# Patient Record
Sex: Male | Born: 1950 | Race: White | Hispanic: No | Marital: Married | Smoking: Former smoker
Health system: Southern US, Community
[De-identification: ages and names within clinical notes are randomized; demographics above are authoritative.]

## PROBLEM LIST (undated history)

## (undated) DIAGNOSIS — E119 Type 2 diabetes mellitus without complications: Secondary | ICD-10-CM

## (undated) DIAGNOSIS — E1169 Type 2 diabetes mellitus with other specified complication: Secondary | ICD-10-CM

## (undated) DIAGNOSIS — R040 Epistaxis: Secondary | ICD-10-CM

## (undated) DIAGNOSIS — I10 Essential (primary) hypertension: Secondary | ICD-10-CM

## (undated) HISTORY — PX: HIP SURGERY: SHX245

---

## 2011-05-20 DIAGNOSIS — W208XXA Other cause of strike by thrown, projected or falling object, initial encounter: Secondary | ICD-10-CM

## 2011-05-20 HISTORY — DX: Other cause of strike by thrown, projected or falling object, initial encounter: W20.8XXA

## 2012-05-19 DIAGNOSIS — R040 Epistaxis: Secondary | ICD-10-CM

## 2012-05-19 HISTORY — DX: Epistaxis: R04.0

## 2013-05-14 ENCOUNTER — Emergency Department (HOSPITAL_COMMUNITY)
Admission: EM | Admit: 2013-05-14 | Discharge: 2013-05-14 | Disposition: A | Discharge status: Home / Self Care | Payer: PRIVATE HEALTH INSURANCE | Attending: Emergency Medicine | Admitting: Emergency Medicine

## 2013-05-14 ENCOUNTER — Encounter (HOSPITAL_COMMUNITY): Payer: Self-pay | Admitting: Emergency Medicine

## 2013-05-14 DIAGNOSIS — R110 Nausea: Secondary | ICD-10-CM | POA: Insufficient documentation

## 2013-05-14 DIAGNOSIS — E119 Type 2 diabetes mellitus without complications: Secondary | ICD-10-CM | POA: Insufficient documentation

## 2013-05-14 DIAGNOSIS — R040 Epistaxis: Secondary | ICD-10-CM

## 2013-05-14 DIAGNOSIS — I10 Essential (primary) hypertension: Secondary | ICD-10-CM | POA: Insufficient documentation

## 2013-05-14 DIAGNOSIS — Z79899 Other long term (current) drug therapy: Secondary | ICD-10-CM | POA: Insufficient documentation

## 2013-05-14 DIAGNOSIS — Z888 Allergy status to other drugs, medicaments and biological substances status: Secondary | ICD-10-CM | POA: Insufficient documentation

## 2013-05-14 HISTORY — DX: Type 2 diabetes mellitus without complications: E11.9

## 2013-05-14 HISTORY — DX: Epistaxis: R04.0

## 2013-05-14 HISTORY — DX: Essential (primary) hypertension: I10

## 2013-05-14 LAB — CBC WITH DIFFERENTIAL/PLATELET
Basophils Relative: 0 % (ref 0–1)
Eosinophils Absolute: 0.3 10*3/uL (ref 0.0–0.7)
Eosinophils Relative: 3 % (ref 0–5)
Hemoglobin: 13.3 g/dL (ref 13.0–17.0)
Lymphocytes Relative: 16 % (ref 12–46)
Lymphs Abs: 1.6 10*3/uL (ref 0.7–4.0)
MCH: 30.6 pg (ref 26.0–34.0)
MCHC: 34.3 g/dL (ref 30.0–36.0)
MCV: 89.4 fL (ref 78.0–100.0)
Monocytes Relative: 7 % (ref 3–12)
Neutrophils Relative %: 75 % (ref 43–77)
RBC: 4.34 MIL/uL (ref 4.22–5.81)
RDW: 14.1 % (ref 11.5–15.5)

## 2013-05-14 LAB — BASIC METABOLIC PANEL
BUN: 25 mg/dL — ABNORMAL HIGH (ref 6–23)
CO2: 24 mEq/L (ref 19–32)
Calcium: 9 mg/dL (ref 8.4–10.5)
Creatinine, Ser: 1.1 mg/dL (ref 0.50–1.35)
GFR calc Af Amer: 80 mL/min — ABNORMAL LOW (ref >90)
GFR calc non Af Amer: 69 mL/min — ABNORMAL LOW (ref >90)
Potassium: 4.3 mEq/L (ref 3.5–5.1)

## 2013-05-14 LAB — PROTIME-INR: INR: 0.99 (ref 0.00–1.49)

## 2013-05-14 MED ORDER — OXYMETAZOLINE HCL 0.05 % NA SOLN
2.0000 | Freq: Once | NASAL | Status: AC
  Administered 2013-05-14: 2 via NASAL
  Filled 2013-05-14: qty 15

## 2013-05-14 NOTE — ED Notes (Signed)
ENT cart at the bedside.

## 2013-05-14 NOTE — ED Notes (Signed)
Saa, phlebotomy at the bedside.

## 2013-05-14 NOTE — ED Provider Notes (Signed)
CSN: [id number]     Arrival date & time 05/14/13  0631 History   First MD Initiated Contact with Patient 05/14/13 [phone]     Chief Complaint  Patient presents with  . Epistaxis   (Consider location/radiation/quality/duration/timing/severity/associated sxs/prior Treatment) HPI Comments: Gary [name] is a 62 year-old male with a past medical history of reoccurring epistaxis s/p multiple procedures, presenting the Emergency Department with a chief complaint of epistaxis since 0615 today.  Denies history of trauma.Last episode was Sunday morning, followed up with ENT and underwent a cauterization procedure. He denies antiplatelet therapy or anticoagulation therapy. Denies history of bleeding disorders.  He reports coughing up a large blood clot while in the ED. ENT: Dr. Adriana Reams   The history is provided by the patient and a relative. No language interpreter was used.    Past Medical History  Diagnosis Date  . Hypertension   . Diabetes mellitus without complication   . Epistaxis    Past Surgical History  Procedure Laterality Date  . Hip surgery     No family history on file. History  Substance Use Topics  . Smoking status: Never Smoker   . Smokeless tobacco: Not on file  . Alcohol Use: No    Review of Systems  Gastrointestinal: Positive for nausea. Negative for vomiting and abdominal pain.    Allergies  Novocain  Home Medications   Current Outpatient Rx  Name  Route  Sig  Dispense  Refill  . acetaminophen (TYLENOL) 325 MG tablet   Oral   Take 650 mg by mouth every 6 (six) hours as needed.         Marland Kitchen amLODipine (NORVASC) 5 MG tablet   Oral   Take 5 mg by mouth every evening.         Marland Kitchen atorvastatin (LIPITOR) 40 MG tablet   Oral   Take 40 mg by mouth every evening.         Marland Kitchen buPROPion (WELLBUTRIN XL) 150 MG 24 hr tablet   Oral   Take 150 mg by mouth 2 (two) times daily.         . Calcium Carbonate-Vitamin D (CALCIUM + D PO)   Oral   Take 1 tablet by  mouth 2 (two) times daily.         . enalapril-hydrochlorothiazide (VASERETIC) 10-25 MG per tablet   Oral   Take 1 tablet by mouth every morning.         Marland Kitchen glipiZIDE (GLUCOTROL XL) 10 MG 24 hr tablet   Oral   Take 10 mg by mouth every morning.         . Multiple Vitamin (MULTIVITAMIN WITH MINERALS) TABS tablet   Oral   Take 1 tablet by mouth daily.         . pioglitazone-metformin (ACTOPLUS MET) 15-850 MG per tablet   Oral   Take 1-2 tablets by mouth 2 (two) times daily with a meal. Take two tablets in the morning, and one in the evening.          BP 138/73  Pulse 95  Temp(Src) 98.9 F (37.2 C) (Oral)  Resp 14  Ht 5' 7.5" (1.715 m)  Wt 232 lb (105.235 kg)  BMI 35.78 kg/m2  SpO2 93% Physical Exam  Nursing note and vitals reviewed. Constitutional: He appears well-developed and well-nourished.  HENT:  Head: Normocephalic and atraumatic.  Nose: Epistaxis is observed.  Blood observed in bilateral nares. No active bleeding.  No identifiable cauterizeable lesion.  Neck:  Neck supple.  Cardiovascular: Normal rate and regular rhythm.   No murmur heard. Pulmonary/Chest: Effort normal. No respiratory distress.  Abdominal: Soft. There is no tenderness.  Skin: Skin is warm and dry. No rash noted.  Psychiatric: He has a normal mood and affect.    ED Course  Procedures (including critical care time) Labs Review Labs Reviewed  CBC WITH DIFFERENTIAL - Abnormal; Notable for the following:    HCT 38.8 (*)    All other components within normal limits  BASIC METABOLIC PANEL - Abnormal; Notable for the following:    Sodium 134 (*)    Glucose, Bld 113 (*)    BUN 25 (*)    GFR calc non Af Amer 69 (*)    GFR calc Af Amer 80 (*)    All other components within normal limits  PROTIME-INR   Imaging Review No results found.  EKG Interpretation   None       MDM   1. Epistaxis    Pt with epistaxis, multiple procedures in the past.  Bleeding subsided in ED. Hbg: 13.3.   Afrin ordered. Re-eval: resolution without another episode. Discussed patient history, condition, and labs with Dr. Canary Brim who agrees the patient can be evaluated as an out-pt. Discussed lab results, imaging results, and treatment plan with the patient. Return precautions given. Reports understanding and no other concerns at this time.  Patient is stable for discharge at this time. Meds given in ED:  Medications  oxymetazoline (AFRIN) 0.05 % nasal spray 2 spray (2 sprays Each Nare Given 05/14/13 0954)    Discharge Medication List as of 05/14/2013 10:54 AM           Ander Purpura Burnetta Sabin, PA-C 05/15/13 2113

## 2013-05-14 NOTE — ED Notes (Signed)
Pt's nose continuing to bleed. Does not feel the blood running down the throat at this time.

## 2013-05-14 NOTE — ED Notes (Signed)
Pt. reports intermittent epistaxis at left nare since Sunday morning , denies injury .

## 2013-05-17 NOTE — ED Provider Notes (Signed)
Medical screening examination/treatment/procedure(s) were performed by non-physician practitioner and as supervising physician I was immediately available for consultation/collaboration.  EKG Interpretation   None        Threasa Beards, MD 05/17/13 [phone]

## 2013-11-08 DIAGNOSIS — E119 Type 2 diabetes mellitus without complications: Secondary | ICD-10-CM | POA: Diagnosis not present

## 2013-11-08 DIAGNOSIS — I10 Essential (primary) hypertension: Secondary | ICD-10-CM | POA: Diagnosis not present

## 2013-11-08 DIAGNOSIS — M1990 Unspecified osteoarthritis, unspecified site: Secondary | ICD-10-CM | POA: Diagnosis not present

## 2013-11-08 DIAGNOSIS — E7800 Pure hypercholesterolemia, unspecified: Secondary | ICD-10-CM | POA: Diagnosis not present

## 2013-11-08 DIAGNOSIS — N189 Chronic kidney disease, unspecified: Secondary | ICD-10-CM | POA: Diagnosis not present

## 2013-11-09 DIAGNOSIS — E119 Type 2 diabetes mellitus without complications: Secondary | ICD-10-CM | POA: Diagnosis not present

## 2013-11-16 DIAGNOSIS — F411 Generalized anxiety disorder: Secondary | ICD-10-CM | POA: Diagnosis not present

## 2013-11-16 DIAGNOSIS — M1990 Unspecified osteoarthritis, unspecified site: Secondary | ICD-10-CM | POA: Diagnosis not present

## 2013-11-16 DIAGNOSIS — E119 Type 2 diabetes mellitus without complications: Secondary | ICD-10-CM | POA: Diagnosis not present

## 2013-11-16 DIAGNOSIS — I10 Essential (primary) hypertension: Secondary | ICD-10-CM | POA: Diagnosis not present

## 2013-11-16 DIAGNOSIS — N189 Chronic kidney disease, unspecified: Secondary | ICD-10-CM | POA: Diagnosis not present

## 2013-11-16 DIAGNOSIS — E7800 Pure hypercholesterolemia, unspecified: Secondary | ICD-10-CM | POA: Diagnosis not present

## 2013-11-16 DIAGNOSIS — F321 Major depressive disorder, single episode, moderate: Secondary | ICD-10-CM | POA: Diagnosis not present

## 2014-02-28 DIAGNOSIS — S3210XD Unspecified fracture of sacrum, subsequent encounter for fracture with routine healing: Secondary | ICD-10-CM | POA: Diagnosis not present

## 2014-02-28 DIAGNOSIS — M160 Bilateral primary osteoarthritis of hip: Secondary | ICD-10-CM | POA: Diagnosis not present

## 2014-02-28 DIAGNOSIS — M47816 Spondylosis without myelopathy or radiculopathy, lumbar region: Secondary | ICD-10-CM | POA: Diagnosis not present

## 2014-02-28 DIAGNOSIS — M439 Deforming dorsopathy, unspecified: Secondary | ICD-10-CM | POA: Diagnosis not present

## 2014-02-28 DIAGNOSIS — Z09 Encounter for follow-up examination after completed treatment for conditions other than malignant neoplasm: Secondary | ICD-10-CM | POA: Diagnosis not present

## 2014-04-17 DIAGNOSIS — Z23 Encounter for immunization: Secondary | ICD-10-CM | POA: Diagnosis not present

## 2014-05-16 DIAGNOSIS — N189 Chronic kidney disease, unspecified: Secondary | ICD-10-CM | POA: Diagnosis not present

## 2014-05-16 DIAGNOSIS — E119 Type 2 diabetes mellitus without complications: Secondary | ICD-10-CM | POA: Diagnosis not present

## 2014-05-16 DIAGNOSIS — M1990 Unspecified osteoarthritis, unspecified site: Secondary | ICD-10-CM | POA: Diagnosis not present

## 2014-05-16 DIAGNOSIS — E780 Pure hypercholesterolemia: Secondary | ICD-10-CM | POA: Diagnosis not present

## 2014-05-16 DIAGNOSIS — I10 Essential (primary) hypertension: Secondary | ICD-10-CM | POA: Diagnosis not present

## 2014-05-23 DIAGNOSIS — F329 Major depressive disorder, single episode, unspecified: Secondary | ICD-10-CM | POA: Diagnosis not present

## 2014-05-23 DIAGNOSIS — F419 Anxiety disorder, unspecified: Secondary | ICD-10-CM | POA: Diagnosis not present

## 2014-05-23 DIAGNOSIS — E785 Hyperlipidemia, unspecified: Secondary | ICD-10-CM | POA: Diagnosis not present

## 2014-05-23 DIAGNOSIS — E119 Type 2 diabetes mellitus without complications: Secondary | ICD-10-CM | POA: Diagnosis not present

## 2014-05-23 DIAGNOSIS — Z1389 Encounter for screening for other disorder: Secondary | ICD-10-CM | POA: Diagnosis not present

## 2014-05-23 DIAGNOSIS — I10 Essential (primary) hypertension: Secondary | ICD-10-CM | POA: Diagnosis not present

## 2014-06-11 DIAGNOSIS — Z87891 Personal history of nicotine dependence: Secondary | ICD-10-CM | POA: Diagnosis not present

## 2014-06-11 DIAGNOSIS — R040 Epistaxis: Secondary | ICD-10-CM | POA: Diagnosis not present

## 2014-06-11 DIAGNOSIS — E119 Type 2 diabetes mellitus without complications: Secondary | ICD-10-CM | POA: Diagnosis not present

## 2014-06-11 DIAGNOSIS — Z79899 Other long term (current) drug therapy: Secondary | ICD-10-CM | POA: Diagnosis not present

## 2014-06-11 DIAGNOSIS — I10 Essential (primary) hypertension: Secondary | ICD-10-CM | POA: Diagnosis not present

## 2014-06-14 DIAGNOSIS — G4733 Obstructive sleep apnea (adult) (pediatric): Secondary | ICD-10-CM | POA: Diagnosis not present

## 2014-06-14 DIAGNOSIS — R040 Epistaxis: Secondary | ICD-10-CM | POA: Diagnosis not present

## 2014-06-28 DIAGNOSIS — Z6837 Body mass index (BMI) 37.0-37.9, adult: Secondary | ICD-10-CM | POA: Diagnosis not present

## 2014-06-28 DIAGNOSIS — G4733 Obstructive sleep apnea (adult) (pediatric): Secondary | ICD-10-CM | POA: Diagnosis not present

## 2014-06-28 DIAGNOSIS — G4761 Periodic limb movement disorder: Secondary | ICD-10-CM | POA: Diagnosis not present

## 2014-07-03 DIAGNOSIS — G4733 Obstructive sleep apnea (adult) (pediatric): Secondary | ICD-10-CM | POA: Diagnosis not present

## 2014-07-26 DIAGNOSIS — G4733 Obstructive sleep apnea (adult) (pediatric): Secondary | ICD-10-CM | POA: Diagnosis not present

## 2014-11-21 DIAGNOSIS — I10 Essential (primary) hypertension: Secondary | ICD-10-CM | POA: Diagnosis not present

## 2014-11-21 DIAGNOSIS — E780 Pure hypercholesterolemia: Secondary | ICD-10-CM | POA: Diagnosis not present

## 2014-11-21 DIAGNOSIS — N189 Chronic kidney disease, unspecified: Secondary | ICD-10-CM | POA: Diagnosis not present

## 2014-11-21 DIAGNOSIS — E119 Type 2 diabetes mellitus without complications: Secondary | ICD-10-CM | POA: Diagnosis not present

## 2014-11-21 DIAGNOSIS — E785 Hyperlipidemia, unspecified: Secondary | ICD-10-CM | POA: Diagnosis not present

## 2014-11-29 DIAGNOSIS — F419 Anxiety disorder, unspecified: Secondary | ICD-10-CM | POA: Diagnosis not present

## 2014-11-29 DIAGNOSIS — E785 Hyperlipidemia, unspecified: Secondary | ICD-10-CM | POA: Diagnosis not present

## 2014-11-29 DIAGNOSIS — F329 Major depressive disorder, single episode, unspecified: Secondary | ICD-10-CM | POA: Diagnosis not present

## 2014-11-29 DIAGNOSIS — E119 Type 2 diabetes mellitus without complications: Secondary | ICD-10-CM | POA: Diagnosis not present

## 2014-11-29 DIAGNOSIS — I10 Essential (primary) hypertension: Secondary | ICD-10-CM | POA: Diagnosis not present

## 2015-02-06 DIAGNOSIS — Z23 Encounter for immunization: Secondary | ICD-10-CM | POA: Diagnosis not present

## 2015-06-04 DIAGNOSIS — N189 Chronic kidney disease, unspecified: Secondary | ICD-10-CM | POA: Diagnosis not present

## 2015-06-04 DIAGNOSIS — E119 Type 2 diabetes mellitus without complications: Secondary | ICD-10-CM | POA: Diagnosis not present

## 2015-06-04 DIAGNOSIS — E7800 Pure hypercholesterolemia, unspecified: Secondary | ICD-10-CM | POA: Diagnosis not present

## 2015-06-04 DIAGNOSIS — I10 Essential (primary) hypertension: Secondary | ICD-10-CM | POA: Diagnosis not present

## 2015-06-12 DIAGNOSIS — E119 Type 2 diabetes mellitus without complications: Secondary | ICD-10-CM | POA: Diagnosis not present

## 2015-06-12 DIAGNOSIS — F419 Anxiety disorder, unspecified: Secondary | ICD-10-CM | POA: Diagnosis not present

## 2015-06-12 DIAGNOSIS — F329 Major depressive disorder, single episode, unspecified: Secondary | ICD-10-CM | POA: Diagnosis not present

## 2015-06-12 DIAGNOSIS — E785 Hyperlipidemia, unspecified: Secondary | ICD-10-CM | POA: Diagnosis not present

## 2015-06-12 DIAGNOSIS — I10 Essential (primary) hypertension: Secondary | ICD-10-CM | POA: Diagnosis not present

## 2015-12-10 DIAGNOSIS — E119 Type 2 diabetes mellitus without complications: Secondary | ICD-10-CM | POA: Diagnosis not present

## 2015-12-10 DIAGNOSIS — Z13220 Encounter for screening for lipoid disorders: Secondary | ICD-10-CM | POA: Diagnosis not present

## 2015-12-10 DIAGNOSIS — E785 Hyperlipidemia, unspecified: Secondary | ICD-10-CM | POA: Diagnosis not present

## 2015-12-10 DIAGNOSIS — N189 Chronic kidney disease, unspecified: Secondary | ICD-10-CM | POA: Diagnosis not present

## 2015-12-10 DIAGNOSIS — F329 Major depressive disorder, single episode, unspecified: Secondary | ICD-10-CM | POA: Diagnosis not present

## 2015-12-10 DIAGNOSIS — I10 Essential (primary) hypertension: Secondary | ICD-10-CM | POA: Diagnosis not present

## 2015-12-10 DIAGNOSIS — E7800 Pure hypercholesterolemia, unspecified: Secondary | ICD-10-CM | POA: Diagnosis not present

## 2015-12-14 DIAGNOSIS — F419 Anxiety disorder, unspecified: Secondary | ICD-10-CM | POA: Diagnosis not present

## 2015-12-14 DIAGNOSIS — E785 Hyperlipidemia, unspecified: Secondary | ICD-10-CM | POA: Diagnosis not present

## 2015-12-14 DIAGNOSIS — I10 Essential (primary) hypertension: Secondary | ICD-10-CM | POA: Diagnosis not present

## 2015-12-14 DIAGNOSIS — Z23 Encounter for immunization: Secondary | ICD-10-CM | POA: Diagnosis not present

## 2015-12-14 DIAGNOSIS — Z1212 Encounter for screening for malignant neoplasm of rectum: Secondary | ICD-10-CM | POA: Diagnosis not present

## 2015-12-14 DIAGNOSIS — E119 Type 2 diabetes mellitus without complications: Secondary | ICD-10-CM | POA: Diagnosis not present

## 2015-12-14 DIAGNOSIS — F329 Major depressive disorder, single episode, unspecified: Secondary | ICD-10-CM | POA: Diagnosis not present

## 2015-12-14 DIAGNOSIS — Z6835 Body mass index (BMI) 35.0-35.9, adult: Secondary | ICD-10-CM | POA: Diagnosis not present

## 2016-02-11 DIAGNOSIS — Z23 Encounter for immunization: Secondary | ICD-10-CM | POA: Diagnosis not present

## 2016-06-13 DIAGNOSIS — I10 Essential (primary) hypertension: Secondary | ICD-10-CM | POA: Diagnosis not present

## 2016-06-13 DIAGNOSIS — E119 Type 2 diabetes mellitus without complications: Secondary | ICD-10-CM | POA: Diagnosis not present

## 2016-06-13 DIAGNOSIS — E785 Hyperlipidemia, unspecified: Secondary | ICD-10-CM | POA: Diagnosis not present

## 2016-06-18 DIAGNOSIS — F329 Major depressive disorder, single episode, unspecified: Secondary | ICD-10-CM | POA: Diagnosis not present

## 2016-06-18 DIAGNOSIS — E119 Type 2 diabetes mellitus without complications: Secondary | ICD-10-CM | POA: Diagnosis not present

## 2016-06-18 DIAGNOSIS — F419 Anxiety disorder, unspecified: Secondary | ICD-10-CM | POA: Diagnosis not present

## 2016-06-18 DIAGNOSIS — I10 Essential (primary) hypertension: Secondary | ICD-10-CM | POA: Diagnosis not present

## 2016-06-18 DIAGNOSIS — Z1212 Encounter for screening for malignant neoplasm of rectum: Secondary | ICD-10-CM | POA: Diagnosis not present

## 2016-06-18 DIAGNOSIS — E785 Hyperlipidemia, unspecified: Secondary | ICD-10-CM | POA: Diagnosis not present

## 2016-06-18 DIAGNOSIS — Z6836 Body mass index (BMI) 36.0-36.9, adult: Secondary | ICD-10-CM | POA: Diagnosis not present

## 2016-12-08 ENCOUNTER — Other Ambulatory Visit: Payer: Self-pay

## 2016-12-12 DIAGNOSIS — M1990 Unspecified osteoarthritis, unspecified site: Secondary | ICD-10-CM | POA: Diagnosis not present

## 2016-12-12 DIAGNOSIS — E119 Type 2 diabetes mellitus without complications: Secondary | ICD-10-CM | POA: Diagnosis not present

## 2016-12-12 DIAGNOSIS — E785 Hyperlipidemia, unspecified: Secondary | ICD-10-CM | POA: Diagnosis not present

## 2016-12-12 DIAGNOSIS — F419 Anxiety disorder, unspecified: Secondary | ICD-10-CM | POA: Diagnosis not present

## 2016-12-12 DIAGNOSIS — F329 Major depressive disorder, single episode, unspecified: Secondary | ICD-10-CM | POA: Diagnosis not present

## 2016-12-12 DIAGNOSIS — E7800 Pure hypercholesterolemia, unspecified: Secondary | ICD-10-CM | POA: Diagnosis not present

## 2016-12-12 DIAGNOSIS — I10 Essential (primary) hypertension: Secondary | ICD-10-CM | POA: Diagnosis not present

## 2016-12-12 DIAGNOSIS — N189 Chronic kidney disease, unspecified: Secondary | ICD-10-CM | POA: Diagnosis not present

## 2016-12-23 DIAGNOSIS — F329 Major depressive disorder, single episode, unspecified: Secondary | ICD-10-CM | POA: Diagnosis not present

## 2016-12-23 DIAGNOSIS — Z6835 Body mass index (BMI) 35.0-35.9, adult: Secondary | ICD-10-CM | POA: Diagnosis not present

## 2016-12-23 DIAGNOSIS — I10 Essential (primary) hypertension: Secondary | ICD-10-CM | POA: Diagnosis not present

## 2016-12-23 DIAGNOSIS — E785 Hyperlipidemia, unspecified: Secondary | ICD-10-CM | POA: Diagnosis not present

## 2016-12-23 DIAGNOSIS — Z0000 Encounter for general adult medical examination without abnormal findings: Secondary | ICD-10-CM | POA: Diagnosis not present

## 2016-12-23 DIAGNOSIS — F419 Anxiety disorder, unspecified: Secondary | ICD-10-CM | POA: Diagnosis not present

## 2016-12-23 DIAGNOSIS — E119 Type 2 diabetes mellitus without complications: Secondary | ICD-10-CM | POA: Diagnosis not present

## 2016-12-23 DIAGNOSIS — D692 Other nonthrombocytopenic purpura: Secondary | ICD-10-CM | POA: Diagnosis not present

## 2017-02-04 DIAGNOSIS — Z23 Encounter for immunization: Secondary | ICD-10-CM | POA: Diagnosis not present

## 2017-06-23 DIAGNOSIS — E7800 Pure hypercholesterolemia, unspecified: Secondary | ICD-10-CM | POA: Diagnosis not present

## 2017-06-23 DIAGNOSIS — E785 Hyperlipidemia, unspecified: Secondary | ICD-10-CM | POA: Diagnosis not present

## 2017-06-23 DIAGNOSIS — F419 Anxiety disorder, unspecified: Secondary | ICD-10-CM | POA: Diagnosis not present

## 2017-06-23 DIAGNOSIS — D692 Other nonthrombocytopenic purpura: Secondary | ICD-10-CM | POA: Diagnosis not present

## 2017-06-23 DIAGNOSIS — N189 Chronic kidney disease, unspecified: Secondary | ICD-10-CM | POA: Diagnosis not present

## 2017-06-23 DIAGNOSIS — E119 Type 2 diabetes mellitus without complications: Secondary | ICD-10-CM | POA: Diagnosis not present

## 2017-06-23 DIAGNOSIS — M1990 Unspecified osteoarthritis, unspecified site: Secondary | ICD-10-CM | POA: Diagnosis not present

## 2017-06-23 DIAGNOSIS — I10 Essential (primary) hypertension: Secondary | ICD-10-CM | POA: Diagnosis not present

## 2017-06-26 DIAGNOSIS — F329 Major depressive disorder, single episode, unspecified: Secondary | ICD-10-CM | POA: Diagnosis not present

## 2017-06-26 DIAGNOSIS — I10 Essential (primary) hypertension: Secondary | ICD-10-CM | POA: Diagnosis not present

## 2017-06-26 DIAGNOSIS — E785 Hyperlipidemia, unspecified: Secondary | ICD-10-CM | POA: Diagnosis not present

## 2017-06-26 DIAGNOSIS — D692 Other nonthrombocytopenic purpura: Secondary | ICD-10-CM | POA: Diagnosis not present

## 2017-06-26 DIAGNOSIS — Z6836 Body mass index (BMI) 36.0-36.9, adult: Secondary | ICD-10-CM | POA: Diagnosis not present

## 2017-06-26 DIAGNOSIS — E119 Type 2 diabetes mellitus without complications: Secondary | ICD-10-CM | POA: Diagnosis not present

## 2017-06-26 DIAGNOSIS — Z1212 Encounter for screening for malignant neoplasm of rectum: Secondary | ICD-10-CM | POA: Diagnosis not present

## 2017-06-26 DIAGNOSIS — F419 Anxiety disorder, unspecified: Secondary | ICD-10-CM | POA: Diagnosis not present

## 2017-12-29 DIAGNOSIS — N189 Chronic kidney disease, unspecified: Secondary | ICD-10-CM | POA: Diagnosis not present

## 2017-12-29 DIAGNOSIS — I10 Essential (primary) hypertension: Secondary | ICD-10-CM | POA: Diagnosis not present

## 2017-12-29 DIAGNOSIS — E7800 Pure hypercholesterolemia, unspecified: Secondary | ICD-10-CM | POA: Diagnosis not present

## 2017-12-29 DIAGNOSIS — E119 Type 2 diabetes mellitus without complications: Secondary | ICD-10-CM | POA: Diagnosis not present

## 2017-12-29 DIAGNOSIS — E785 Hyperlipidemia, unspecified: Secondary | ICD-10-CM | POA: Diagnosis not present

## 2017-12-29 DIAGNOSIS — F419 Anxiety disorder, unspecified: Secondary | ICD-10-CM | POA: Diagnosis not present

## 2018-01-01 DIAGNOSIS — Z0000 Encounter for general adult medical examination without abnormal findings: Secondary | ICD-10-CM | POA: Diagnosis not present

## 2018-01-01 DIAGNOSIS — D692 Other nonthrombocytopenic purpura: Secondary | ICD-10-CM | POA: Diagnosis not present

## 2018-01-01 DIAGNOSIS — Z6835 Body mass index (BMI) 35.0-35.9, adult: Secondary | ICD-10-CM | POA: Diagnosis not present

## 2018-01-01 DIAGNOSIS — E119 Type 2 diabetes mellitus without complications: Secondary | ICD-10-CM | POA: Diagnosis not present

## 2018-01-01 DIAGNOSIS — F329 Major depressive disorder, single episode, unspecified: Secondary | ICD-10-CM | POA: Diagnosis not present

## 2018-01-01 DIAGNOSIS — I10 Essential (primary) hypertension: Secondary | ICD-10-CM | POA: Diagnosis not present

## 2018-01-01 DIAGNOSIS — E785 Hyperlipidemia, unspecified: Secondary | ICD-10-CM | POA: Diagnosis not present

## 2018-01-01 DIAGNOSIS — F419 Anxiety disorder, unspecified: Secondary | ICD-10-CM | POA: Diagnosis not present

## 2018-01-28 DIAGNOSIS — Z23 Encounter for immunization: Secondary | ICD-10-CM | POA: Diagnosis not present

## 2018-07-05 DIAGNOSIS — K210 Gastro-esophageal reflux disease with esophagitis: Secondary | ICD-10-CM | POA: Diagnosis not present

## 2018-07-05 DIAGNOSIS — F419 Anxiety disorder, unspecified: Secondary | ICD-10-CM | POA: Diagnosis not present

## 2018-07-05 DIAGNOSIS — E039 Hypothyroidism, unspecified: Secondary | ICD-10-CM | POA: Diagnosis not present

## 2018-07-05 DIAGNOSIS — E1165 Type 2 diabetes mellitus with hyperglycemia: Secondary | ICD-10-CM | POA: Diagnosis not present

## 2018-07-05 DIAGNOSIS — E785 Hyperlipidemia, unspecified: Secondary | ICD-10-CM | POA: Diagnosis not present

## 2018-07-05 DIAGNOSIS — I10 Essential (primary) hypertension: Secondary | ICD-10-CM | POA: Diagnosis not present

## 2018-07-06 DIAGNOSIS — D692 Other nonthrombocytopenic purpura: Secondary | ICD-10-CM | POA: Diagnosis not present

## 2018-07-06 DIAGNOSIS — E785 Hyperlipidemia, unspecified: Secondary | ICD-10-CM | POA: Diagnosis not present

## 2018-07-06 DIAGNOSIS — I10 Essential (primary) hypertension: Secondary | ICD-10-CM | POA: Diagnosis not present

## 2018-07-06 DIAGNOSIS — E119 Type 2 diabetes mellitus without complications: Secondary | ICD-10-CM | POA: Diagnosis not present

## 2018-07-06 DIAGNOSIS — Z6836 Body mass index (BMI) 36.0-36.9, adult: Secondary | ICD-10-CM | POA: Diagnosis not present

## 2018-07-06 DIAGNOSIS — F329 Major depressive disorder, single episode, unspecified: Secondary | ICD-10-CM | POA: Diagnosis not present

## 2018-07-06 DIAGNOSIS — F419 Anxiety disorder, unspecified: Secondary | ICD-10-CM | POA: Diagnosis not present

## 2018-12-17 DIAGNOSIS — E1165 Type 2 diabetes mellitus with hyperglycemia: Secondary | ICD-10-CM | POA: Diagnosis not present

## 2018-12-17 DIAGNOSIS — I10 Essential (primary) hypertension: Secondary | ICD-10-CM | POA: Diagnosis not present

## 2018-12-17 DIAGNOSIS — N189 Chronic kidney disease, unspecified: Secondary | ICD-10-CM | POA: Diagnosis not present

## 2018-12-17 DIAGNOSIS — E039 Hypothyroidism, unspecified: Secondary | ICD-10-CM | POA: Diagnosis not present

## 2018-12-17 DIAGNOSIS — E785 Hyperlipidemia, unspecified: Secondary | ICD-10-CM | POA: Diagnosis not present

## 2018-12-17 DIAGNOSIS — F419 Anxiety disorder, unspecified: Secondary | ICD-10-CM | POA: Diagnosis not present

## 2018-12-17 DIAGNOSIS — K210 Gastro-esophageal reflux disease with esophagitis: Secondary | ICD-10-CM | POA: Diagnosis not present

## 2018-12-21 DIAGNOSIS — Z0000 Encounter for general adult medical examination without abnormal findings: Secondary | ICD-10-CM | POA: Diagnosis not present

## 2018-12-21 DIAGNOSIS — F419 Anxiety disorder, unspecified: Secondary | ICD-10-CM | POA: Diagnosis not present

## 2018-12-21 DIAGNOSIS — Z6835 Body mass index (BMI) 35.0-35.9, adult: Secondary | ICD-10-CM | POA: Diagnosis not present

## 2018-12-21 DIAGNOSIS — E785 Hyperlipidemia, unspecified: Secondary | ICD-10-CM | POA: Diagnosis not present

## 2018-12-21 DIAGNOSIS — E119 Type 2 diabetes mellitus without complications: Secondary | ICD-10-CM | POA: Diagnosis not present

## 2018-12-21 DIAGNOSIS — F329 Major depressive disorder, single episode, unspecified: Secondary | ICD-10-CM | POA: Diagnosis not present

## 2018-12-21 DIAGNOSIS — D692 Other nonthrombocytopenic purpura: Secondary | ICD-10-CM | POA: Diagnosis not present

## 2018-12-21 DIAGNOSIS — I10 Essential (primary) hypertension: Secondary | ICD-10-CM | POA: Diagnosis not present

## 2019-03-18 DIAGNOSIS — I10 Essential (primary) hypertension: Secondary | ICD-10-CM | POA: Diagnosis not present

## 2019-03-18 DIAGNOSIS — E782 Mixed hyperlipidemia: Secondary | ICD-10-CM | POA: Diagnosis not present

## 2019-03-21 DIAGNOSIS — Z23 Encounter for immunization: Secondary | ICD-10-CM | POA: Diagnosis not present

## 2019-04-18 DIAGNOSIS — E785 Hyperlipidemia, unspecified: Secondary | ICD-10-CM | POA: Diagnosis not present

## 2019-04-18 DIAGNOSIS — I10 Essential (primary) hypertension: Secondary | ICD-10-CM | POA: Diagnosis not present

## 2019-06-17 ENCOUNTER — Ambulatory Visit: Payer: PRIVATE HEALTH INSURANCE

## 2019-06-17 DIAGNOSIS — K2100 Gastro-esophageal reflux disease with esophagitis, without bleeding: Secondary | ICD-10-CM | POA: Diagnosis not present

## 2019-06-17 DIAGNOSIS — N189 Chronic kidney disease, unspecified: Secondary | ICD-10-CM | POA: Diagnosis not present

## 2019-06-17 DIAGNOSIS — E785 Hyperlipidemia, unspecified: Secondary | ICD-10-CM | POA: Diagnosis not present

## 2019-06-17 DIAGNOSIS — E039 Hypothyroidism, unspecified: Secondary | ICD-10-CM | POA: Diagnosis not present

## 2019-06-17 DIAGNOSIS — I10 Essential (primary) hypertension: Secondary | ICD-10-CM | POA: Diagnosis not present

## 2019-06-17 DIAGNOSIS — E1165 Type 2 diabetes mellitus with hyperglycemia: Secondary | ICD-10-CM | POA: Diagnosis not present

## 2019-06-17 DIAGNOSIS — M1990 Unspecified osteoarthritis, unspecified site: Secondary | ICD-10-CM | POA: Diagnosis not present

## 2019-06-17 DIAGNOSIS — E7800 Pure hypercholesterolemia, unspecified: Secondary | ICD-10-CM | POA: Diagnosis not present

## 2019-06-21 DIAGNOSIS — E785 Hyperlipidemia, unspecified: Secondary | ICD-10-CM | POA: Diagnosis not present

## 2019-06-21 DIAGNOSIS — F419 Anxiety disorder, unspecified: Secondary | ICD-10-CM | POA: Diagnosis not present

## 2019-06-21 DIAGNOSIS — E119 Type 2 diabetes mellitus without complications: Secondary | ICD-10-CM | POA: Diagnosis not present

## 2019-06-21 DIAGNOSIS — Z6835 Body mass index (BMI) 35.0-35.9, adult: Secondary | ICD-10-CM | POA: Diagnosis not present

## 2019-06-21 DIAGNOSIS — F329 Major depressive disorder, single episode, unspecified: Secondary | ICD-10-CM | POA: Diagnosis not present

## 2019-06-21 DIAGNOSIS — I10 Essential (primary) hypertension: Secondary | ICD-10-CM | POA: Diagnosis not present

## 2019-06-21 DIAGNOSIS — D692 Other nonthrombocytopenic purpura: Secondary | ICD-10-CM | POA: Diagnosis not present

## 2019-06-22 ENCOUNTER — Ambulatory Visit: Payer: PRIVATE HEALTH INSURANCE

## 2019-06-26 ENCOUNTER — Ambulatory Visit: Payer: PRIVATE HEALTH INSURANCE | Attending: Internal Medicine

## 2019-10-17 DIAGNOSIS — N189 Chronic kidney disease, unspecified: Secondary | ICD-10-CM | POA: Diagnosis not present

## 2019-10-17 DIAGNOSIS — I129 Hypertensive chronic kidney disease with stage 1 through stage 4 chronic kidney disease, or unspecified chronic kidney disease: Secondary | ICD-10-CM | POA: Diagnosis not present

## 2019-10-17 DIAGNOSIS — K219 Gastro-esophageal reflux disease without esophagitis: Secondary | ICD-10-CM | POA: Diagnosis not present

## 2019-10-17 DIAGNOSIS — E1122 Type 2 diabetes mellitus with diabetic chronic kidney disease: Secondary | ICD-10-CM | POA: Diagnosis not present

## 2019-12-15 DIAGNOSIS — K2100 Gastro-esophageal reflux disease with esophagitis, without bleeding: Secondary | ICD-10-CM | POA: Diagnosis not present

## 2019-12-15 DIAGNOSIS — E7800 Pure hypercholesterolemia, unspecified: Secondary | ICD-10-CM | POA: Diagnosis not present

## 2019-12-15 DIAGNOSIS — E1165 Type 2 diabetes mellitus with hyperglycemia: Secondary | ICD-10-CM | POA: Diagnosis not present

## 2019-12-15 DIAGNOSIS — N189 Chronic kidney disease, unspecified: Secondary | ICD-10-CM | POA: Diagnosis not present

## 2019-12-15 DIAGNOSIS — I10 Essential (primary) hypertension: Secondary | ICD-10-CM | POA: Diagnosis not present

## 2019-12-15 DIAGNOSIS — E785 Hyperlipidemia, unspecified: Secondary | ICD-10-CM | POA: Diagnosis not present

## 2019-12-21 DIAGNOSIS — F419 Anxiety disorder, unspecified: Secondary | ICD-10-CM | POA: Diagnosis not present

## 2019-12-21 DIAGNOSIS — E119 Type 2 diabetes mellitus without complications: Secondary | ICD-10-CM | POA: Diagnosis not present

## 2019-12-21 DIAGNOSIS — D692 Other nonthrombocytopenic purpura: Secondary | ICD-10-CM | POA: Diagnosis not present

## 2019-12-21 DIAGNOSIS — I10 Essential (primary) hypertension: Secondary | ICD-10-CM | POA: Diagnosis not present

## 2019-12-21 DIAGNOSIS — F329 Major depressive disorder, single episode, unspecified: Secondary | ICD-10-CM | POA: Diagnosis not present

## 2019-12-21 DIAGNOSIS — Z6832 Body mass index (BMI) 32.0-32.9, adult: Secondary | ICD-10-CM | POA: Diagnosis not present

## 2019-12-21 DIAGNOSIS — E785 Hyperlipidemia, unspecified: Secondary | ICD-10-CM | POA: Diagnosis not present

## 2019-12-21 DIAGNOSIS — Z0000 Encounter for general adult medical examination without abnormal findings: Secondary | ICD-10-CM | POA: Diagnosis not present

## 2020-01-17 DIAGNOSIS — K219 Gastro-esophageal reflux disease without esophagitis: Secondary | ICD-10-CM | POA: Diagnosis not present

## 2020-01-17 DIAGNOSIS — N189 Chronic kidney disease, unspecified: Secondary | ICD-10-CM | POA: Diagnosis not present

## 2020-01-17 DIAGNOSIS — E1122 Type 2 diabetes mellitus with diabetic chronic kidney disease: Secondary | ICD-10-CM | POA: Diagnosis not present

## 2020-01-17 DIAGNOSIS — I129 Hypertensive chronic kidney disease with stage 1 through stage 4 chronic kidney disease, or unspecified chronic kidney disease: Secondary | ICD-10-CM | POA: Diagnosis not present

## 2020-03-04 DIAGNOSIS — Z23 Encounter for immunization: Secondary | ICD-10-CM | POA: Diagnosis not present

## 2020-03-17 DIAGNOSIS — N189 Chronic kidney disease, unspecified: Secondary | ICD-10-CM | POA: Diagnosis not present

## 2020-03-17 DIAGNOSIS — E1122 Type 2 diabetes mellitus with diabetic chronic kidney disease: Secondary | ICD-10-CM | POA: Diagnosis not present

## 2020-03-17 DIAGNOSIS — I129 Hypertensive chronic kidney disease with stage 1 through stage 4 chronic kidney disease, or unspecified chronic kidney disease: Secondary | ICD-10-CM | POA: Diagnosis not present

## 2020-03-17 DIAGNOSIS — K219 Gastro-esophageal reflux disease without esophagitis: Secondary | ICD-10-CM | POA: Diagnosis not present

## 2020-04-17 DIAGNOSIS — I129 Hypertensive chronic kidney disease with stage 1 through stage 4 chronic kidney disease, or unspecified chronic kidney disease: Secondary | ICD-10-CM | POA: Diagnosis not present

## 2020-04-17 DIAGNOSIS — N189 Chronic kidney disease, unspecified: Secondary | ICD-10-CM | POA: Diagnosis not present

## 2020-04-17 DIAGNOSIS — E1122 Type 2 diabetes mellitus with diabetic chronic kidney disease: Secondary | ICD-10-CM | POA: Diagnosis not present

## 2020-05-18 DIAGNOSIS — K219 Gastro-esophageal reflux disease without esophagitis: Secondary | ICD-10-CM | POA: Diagnosis not present

## 2020-05-18 DIAGNOSIS — E1122 Type 2 diabetes mellitus with diabetic chronic kidney disease: Secondary | ICD-10-CM | POA: Diagnosis not present

## 2020-05-18 DIAGNOSIS — I129 Hypertensive chronic kidney disease with stage 1 through stage 4 chronic kidney disease, or unspecified chronic kidney disease: Secondary | ICD-10-CM | POA: Diagnosis not present

## 2020-05-18 DIAGNOSIS — N189 Chronic kidney disease, unspecified: Secondary | ICD-10-CM | POA: Diagnosis not present

## 2020-06-15 DIAGNOSIS — K2100 Gastro-esophageal reflux disease with esophagitis, without bleeding: Secondary | ICD-10-CM | POA: Diagnosis not present

## 2020-06-15 DIAGNOSIS — N189 Chronic kidney disease, unspecified: Secondary | ICD-10-CM | POA: Diagnosis not present

## 2020-06-15 DIAGNOSIS — E7800 Pure hypercholesterolemia, unspecified: Secondary | ICD-10-CM | POA: Diagnosis not present

## 2020-06-15 DIAGNOSIS — E039 Hypothyroidism, unspecified: Secondary | ICD-10-CM | POA: Diagnosis not present

## 2020-06-15 DIAGNOSIS — E1165 Type 2 diabetes mellitus with hyperglycemia: Secondary | ICD-10-CM | POA: Diagnosis not present

## 2020-06-15 DIAGNOSIS — E7801 Familial hypercholesterolemia: Secondary | ICD-10-CM | POA: Diagnosis not present

## 2020-06-15 DIAGNOSIS — I10 Essential (primary) hypertension: Secondary | ICD-10-CM | POA: Diagnosis not present

## 2020-06-27 DIAGNOSIS — R0602 Shortness of breath: Secondary | ICD-10-CM | POA: Diagnosis not present

## 2020-06-27 DIAGNOSIS — E785 Hyperlipidemia, unspecified: Secondary | ICD-10-CM | POA: Diagnosis not present

## 2020-06-27 DIAGNOSIS — E119 Type 2 diabetes mellitus without complications: Secondary | ICD-10-CM | POA: Diagnosis not present

## 2020-06-27 DIAGNOSIS — Z6833 Body mass index (BMI) 33.0-33.9, adult: Secondary | ICD-10-CM | POA: Diagnosis not present

## 2020-06-27 DIAGNOSIS — Z23 Encounter for immunization: Secondary | ICD-10-CM | POA: Diagnosis not present

## 2020-06-27 DIAGNOSIS — J189 Pneumonia, unspecified organism: Secondary | ICD-10-CM | POA: Diagnosis not present

## 2020-06-27 DIAGNOSIS — F419 Anxiety disorder, unspecified: Secondary | ICD-10-CM | POA: Diagnosis not present

## 2020-06-27 DIAGNOSIS — D692 Other nonthrombocytopenic purpura: Secondary | ICD-10-CM | POA: Diagnosis not present

## 2020-06-27 DIAGNOSIS — I10 Essential (primary) hypertension: Secondary | ICD-10-CM | POA: Diagnosis not present

## 2020-06-27 DIAGNOSIS — F329 Major depressive disorder, single episode, unspecified: Secondary | ICD-10-CM | POA: Diagnosis not present

## 2020-08-15 DIAGNOSIS — N189 Chronic kidney disease, unspecified: Secondary | ICD-10-CM | POA: Diagnosis not present

## 2020-08-15 DIAGNOSIS — E1122 Type 2 diabetes mellitus with diabetic chronic kidney disease: Secondary | ICD-10-CM | POA: Diagnosis not present

## 2020-08-15 DIAGNOSIS — I129 Hypertensive chronic kidney disease with stage 1 through stage 4 chronic kidney disease, or unspecified chronic kidney disease: Secondary | ICD-10-CM | POA: Diagnosis not present

## 2020-08-15 DIAGNOSIS — K219 Gastro-esophageal reflux disease without esophagitis: Secondary | ICD-10-CM | POA: Diagnosis not present

## 2020-09-03 ENCOUNTER — Other Ambulatory Visit: Payer: Self-pay

## 2020-09-03 ENCOUNTER — Observation Stay (HOSPITAL_BASED_OUTPATIENT_CLINIC_OR_DEPARTMENT_OTHER): Payer: Medicare Other

## 2020-09-03 ENCOUNTER — Inpatient Hospital Stay (HOSPITAL_COMMUNITY)
Admission: EM | Admit: 2020-09-03 | Discharge: 2020-10-02 | DRG: 286 | Disposition: A | Discharge status: Rehabilitation Facility | Payer: Medicare Other | Attending: Family Medicine | Admitting: Family Medicine

## 2020-09-03 ENCOUNTER — Emergency Department (HOSPITAL_COMMUNITY): Payer: Medicare Other

## 2020-09-03 ENCOUNTER — Observation Stay (HOSPITAL_COMMUNITY): Payer: Medicare Other

## 2020-09-03 ENCOUNTER — Encounter (HOSPITAL_COMMUNITY): Payer: Self-pay | Admitting: Emergency Medicine

## 2020-09-03 DIAGNOSIS — J90 Pleural effusion, not elsewhere classified: Secondary | ICD-10-CM | POA: Diagnosis not present

## 2020-09-03 DIAGNOSIS — Z884 Allergy status to anesthetic agent status: Secondary | ICD-10-CM

## 2020-09-03 DIAGNOSIS — I130 Hypertensive heart and chronic kidney disease with heart failure and stage 1 through stage 4 chronic kidney disease, or unspecified chronic kidney disease: Secondary | ICD-10-CM | POA: Diagnosis not present

## 2020-09-03 DIAGNOSIS — R578 Other shock: Secondary | ICD-10-CM | POA: Diagnosis not present

## 2020-09-03 DIAGNOSIS — R7982 Elevated C-reactive protein (CRP): Secondary | ICD-10-CM | POA: Diagnosis present

## 2020-09-03 DIAGNOSIS — J9601 Acute respiratory failure with hypoxia: Secondary | ICD-10-CM

## 2020-09-03 DIAGNOSIS — I2582 Chronic total occlusion of coronary artery: Secondary | ICD-10-CM | POA: Diagnosis present

## 2020-09-03 DIAGNOSIS — N1831 Chronic kidney disease, stage 3a: Secondary | ICD-10-CM | POA: Diagnosis present

## 2020-09-03 DIAGNOSIS — Z6829 Body mass index (BMI) 29.0-29.9, adult: Secondary | ICD-10-CM

## 2020-09-03 DIAGNOSIS — G4733 Obstructive sleep apnea (adult) (pediatric): Secondary | ICD-10-CM | POA: Diagnosis present

## 2020-09-03 DIAGNOSIS — I517 Cardiomegaly: Secondary | ICD-10-CM | POA: Diagnosis not present

## 2020-09-03 DIAGNOSIS — I248 Other forms of acute ischemic heart disease: Secondary | ICD-10-CM | POA: Diagnosis present

## 2020-09-03 DIAGNOSIS — I959 Hypotension, unspecified: Secondary | ICD-10-CM

## 2020-09-03 DIAGNOSIS — I50813 Acute on chronic right heart failure: Secondary | ICD-10-CM | POA: Diagnosis not present

## 2020-09-03 DIAGNOSIS — I4891 Unspecified atrial fibrillation: Secondary | ICD-10-CM | POA: Diagnosis not present

## 2020-09-03 DIAGNOSIS — E875 Hyperkalemia: Secondary | ICD-10-CM | POA: Diagnosis present

## 2020-09-03 DIAGNOSIS — I440 Atrioventricular block, first degree: Secondary | ICD-10-CM | POA: Diagnosis not present

## 2020-09-03 DIAGNOSIS — I480 Paroxysmal atrial fibrillation: Secondary | ICD-10-CM | POA: Diagnosis present

## 2020-09-03 DIAGNOSIS — I482 Chronic atrial fibrillation, unspecified: Secondary | ICD-10-CM

## 2020-09-03 DIAGNOSIS — I2510 Atherosclerotic heart disease of native coronary artery without angina pectoris: Secondary | ICD-10-CM | POA: Diagnosis present

## 2020-09-03 DIAGNOSIS — E876 Hypokalemia: Secondary | ICD-10-CM | POA: Diagnosis not present

## 2020-09-03 DIAGNOSIS — Z713 Dietary counseling and surveillance: Secondary | ICD-10-CM

## 2020-09-03 DIAGNOSIS — Z5320 Procedure and treatment not carried out because of patient's decision for unspecified reasons: Secondary | ICD-10-CM | POA: Diagnosis present

## 2020-09-03 DIAGNOSIS — E669 Obesity, unspecified: Secondary | ICD-10-CM | POA: Diagnosis present

## 2020-09-03 DIAGNOSIS — Z8711 Personal history of peptic ulcer disease: Secondary | ICD-10-CM

## 2020-09-03 DIAGNOSIS — R590 Localized enlarged lymph nodes: Secondary | ICD-10-CM | POA: Diagnosis present

## 2020-09-03 DIAGNOSIS — D62 Acute posthemorrhagic anemia: Secondary | ICD-10-CM | POA: Diagnosis not present

## 2020-09-03 DIAGNOSIS — Z9889 Other specified postprocedural states: Secondary | ICD-10-CM

## 2020-09-03 DIAGNOSIS — Z79899 Other long term (current) drug therapy: Secondary | ICD-10-CM

## 2020-09-03 DIAGNOSIS — D649 Anemia, unspecified: Secondary | ICD-10-CM

## 2020-09-03 DIAGNOSIS — K449 Diaphragmatic hernia without obstruction or gangrene: Secondary | ICD-10-CM | POA: Diagnosis present

## 2020-09-03 DIAGNOSIS — E1122 Type 2 diabetes mellitus with diabetic chronic kidney disease: Secondary | ICD-10-CM | POA: Diagnosis present

## 2020-09-03 DIAGNOSIS — E1169 Type 2 diabetes mellitus with other specified complication: Secondary | ICD-10-CM | POA: Diagnosis present

## 2020-09-03 DIAGNOSIS — Z01818 Encounter for other preprocedural examination: Secondary | ICD-10-CM

## 2020-09-03 DIAGNOSIS — I081 Rheumatic disorders of both mitral and tricuspid valves: Secondary | ICD-10-CM | POA: Diagnosis present

## 2020-09-03 DIAGNOSIS — E8809 Other disorders of plasma-protein metabolism, not elsewhere classified: Secondary | ICD-10-CM | POA: Diagnosis not present

## 2020-09-03 DIAGNOSIS — I509 Heart failure, unspecified: Secondary | ICD-10-CM

## 2020-09-03 DIAGNOSIS — J189 Pneumonia, unspecified organism: Secondary | ICD-10-CM | POA: Diagnosis not present

## 2020-09-03 DIAGNOSIS — R700 Elevated erythrocyte sedimentation rate: Secondary | ICD-10-CM | POA: Diagnosis present

## 2020-09-03 DIAGNOSIS — J80 Acute respiratory distress syndrome: Secondary | ICD-10-CM | POA: Diagnosis not present

## 2020-09-03 DIAGNOSIS — K260 Acute duodenal ulcer with hemorrhage: Secondary | ICD-10-CM | POA: Diagnosis not present

## 2020-09-03 DIAGNOSIS — J9690 Respiratory failure, unspecified, unspecified whether with hypoxia or hypercapnia: Secondary | ICD-10-CM

## 2020-09-03 DIAGNOSIS — J154 Pneumonia due to other streptococci: Secondary | ICD-10-CM | POA: Diagnosis not present

## 2020-09-03 DIAGNOSIS — I2781 Cor pulmonale (chronic): Secondary | ICD-10-CM | POA: Diagnosis present

## 2020-09-03 DIAGNOSIS — D75839 Thrombocytosis, unspecified: Secondary | ICD-10-CM | POA: Diagnosis not present

## 2020-09-03 DIAGNOSIS — B9681 Helicobacter pylori [H. pylori] as the cause of diseases classified elsewhere: Secondary | ICD-10-CM | POA: Diagnosis present

## 2020-09-03 DIAGNOSIS — Z7982 Long term (current) use of aspirin: Secondary | ICD-10-CM

## 2020-09-03 DIAGNOSIS — Z20822 Contact with and (suspected) exposure to covid-19: Secondary | ICD-10-CM | POA: Diagnosis present

## 2020-09-03 DIAGNOSIS — K2970 Gastritis, unspecified, without bleeding: Secondary | ICD-10-CM | POA: Diagnosis present

## 2020-09-03 DIAGNOSIS — K2270 Barrett's esophagus without dysplasia: Secondary | ICD-10-CM | POA: Diagnosis present

## 2020-09-03 DIAGNOSIS — Z7984 Long term (current) use of oral hypoglycemic drugs: Secondary | ICD-10-CM

## 2020-09-03 DIAGNOSIS — Z833 Family history of diabetes mellitus: Secondary | ICD-10-CM

## 2020-09-03 DIAGNOSIS — R0902 Hypoxemia: Secondary | ICD-10-CM

## 2020-09-03 DIAGNOSIS — R0600 Dyspnea, unspecified: Secondary | ICD-10-CM | POA: Diagnosis not present

## 2020-09-03 DIAGNOSIS — N179 Acute kidney failure, unspecified: Secondary | ICD-10-CM | POA: Diagnosis not present

## 2020-09-03 DIAGNOSIS — I2721 Secondary pulmonary arterial hypertension: Secondary | ICD-10-CM | POA: Diagnosis present

## 2020-09-03 DIAGNOSIS — E871 Hypo-osmolality and hyponatremia: Secondary | ICD-10-CM | POA: Diagnosis present

## 2020-09-03 DIAGNOSIS — J9811 Atelectasis: Secondary | ICD-10-CM | POA: Diagnosis not present

## 2020-09-03 DIAGNOSIS — I2729 Other secondary pulmonary hypertension: Secondary | ICD-10-CM | POA: Diagnosis present

## 2020-09-03 DIAGNOSIS — E785 Hyperlipidemia, unspecified: Secondary | ICD-10-CM | POA: Diagnosis present

## 2020-09-03 DIAGNOSIS — Z452 Encounter for adjustment and management of vascular access device: Secondary | ICD-10-CM

## 2020-09-03 DIAGNOSIS — F32A Depression, unspecified: Secondary | ICD-10-CM | POA: Diagnosis present

## 2020-09-03 DIAGNOSIS — R918 Other nonspecific abnormal finding of lung field: Secondary | ICD-10-CM | POA: Diagnosis present

## 2020-09-03 DIAGNOSIS — E1165 Type 2 diabetes mellitus with hyperglycemia: Secondary | ICD-10-CM | POA: Diagnosis present

## 2020-09-03 DIAGNOSIS — J440 Chronic obstructive pulmonary disease with acute lower respiratory infection: Secondary | ICD-10-CM | POA: Diagnosis not present

## 2020-09-03 DIAGNOSIS — Z87891 Personal history of nicotine dependence: Secondary | ICD-10-CM

## 2020-09-03 DIAGNOSIS — I484 Atypical atrial flutter: Secondary | ICD-10-CM | POA: Diagnosis present

## 2020-09-03 DIAGNOSIS — I4510 Unspecified right bundle-branch block: Secondary | ICD-10-CM | POA: Diagnosis not present

## 2020-09-03 DIAGNOSIS — R0982 Postnasal drip: Secondary | ICD-10-CM | POA: Diagnosis present

## 2020-09-03 HISTORY — DX: Type 2 diabetes mellitus with other specified complication: E11.69

## 2020-09-03 LAB — CBC
HCT: 44.1 % (ref 39.0–52.0)
Hemoglobin: 14.6 g/dL (ref 13.0–17.0)
MCH: 31.1 pg (ref 26.0–34.0)
MCHC: 33.1 g/dL (ref 30.0–36.0)
MCV: 94 fL (ref 80.0–100.0)
Platelets: 323 10*3/uL (ref 150–400)
RBC: 4.69 MIL/uL (ref 4.22–5.81)
RDW: 14.5 % (ref 11.5–15.5)
WBC: 8.1 10*3/uL (ref 4.0–10.5)
nRBC: 0 % (ref 0.0–0.2)

## 2020-09-03 LAB — BASIC METABOLIC PANEL
Anion gap: 14 (ref 5–15)
Anion gap: 11 (ref 5–15)
BUN: 41 mg/dL — ABNORMAL HIGH (ref 8–23)
BUN: 45 mg/dL — ABNORMAL HIGH (ref 8–23)
CO2: 21 mmol/L — ABNORMAL LOW (ref 22–32)
CO2: 25 mmol/L (ref 22–32)
Calcium: 8.8 mg/dL — ABNORMAL LOW (ref 8.9–10.3)
Calcium: 8.7 mg/dL — ABNORMAL LOW (ref 8.9–10.3)
Chloride: 99 mmol/L (ref 98–111)
Chloride: 101 mmol/L (ref 98–111)
Creatinine, Ser: 1.46 mg/dL — ABNORMAL HIGH (ref 0.61–1.24)
Creatinine, Ser: 1.57 mg/dL — ABNORMAL HIGH (ref 0.61–1.24)
GFR, Estimated: 51 mL/min — ABNORMAL LOW (ref >60)
GFR, Estimated: 47 mL/min — ABNORMAL LOW (ref >60)
Glucose, Bld: 116 mg/dL — ABNORMAL HIGH (ref 70–99)
Glucose, Bld: 82 mg/dL (ref 70–99)
Potassium: 4.5 mmol/L (ref 3.5–5.1)
Potassium: 4.6 mmol/L (ref 3.5–5.1)
Sodium: 134 mmol/L — ABNORMAL LOW (ref 135–145)
Sodium: 137 mmol/L (ref 135–145)

## 2020-09-03 LAB — RESP PANEL BY RT-PCR (FLU A&B, COVID) ARPGX2
Influenza A by PCR: NEGATIVE
Influenza B by PCR: NEGATIVE
SARS Coronavirus 2 by RT PCR: NEGATIVE

## 2020-09-03 LAB — GLUCOSE, CAPILLARY
Glucose-Capillary: 86 mg/dL (ref 70–99)
Glucose-Capillary: 114 mg/dL — ABNORMAL HIGH (ref 70–99)

## 2020-09-03 LAB — TROPONIN I (HIGH SENSITIVITY)
Troponin I (High Sensitivity): 98 ng/L — ABNORMAL HIGH (ref <18)
Troponin I (High Sensitivity): 101 ng/L (ref <18)
Troponin I (High Sensitivity): 98 ng/L — ABNORMAL HIGH (ref <18)

## 2020-09-03 LAB — ECHOCARDIOGRAM COMPLETE: S' Lateral: 3.4 cm

## 2020-09-03 LAB — BRAIN NATRIURETIC PEPTIDE: B Natriuretic Peptide: 1202.7 pg/mL — ABNORMAL HIGH (ref 0.0–100.0)

## 2020-09-03 LAB — HEMOGLOBIN A1C
Hgb A1c MFr Bld: 6.7 % — ABNORMAL HIGH (ref 4.8–5.6)
Mean Plasma Glucose: 145.59 mg/dL

## 2020-09-03 LAB — TSH: TSH: 2.514 u[IU]/mL (ref 0.350–4.500)

## 2020-09-03 MED ORDER — INSULIN ASPART 100 UNIT/ML ~~LOC~~ SOLN
0.0000 [IU] | Freq: Three times a day (TID) | SUBCUTANEOUS | Status: DC
  Administered 2020-09-04: 1 [IU] via SUBCUTANEOUS
  Administered 2020-09-04: 3 [IU] via SUBCUTANEOUS
  Administered 2020-09-05: 2 [IU] via SUBCUTANEOUS
  Administered 2020-09-06 (×2): 3 [IU] via SUBCUTANEOUS
  Administered 2020-09-06 – 2020-09-07 (×4): 2 [IU] via SUBCUTANEOUS
  Administered 2020-09-07: 5 [IU] via SUBCUTANEOUS
  Administered 2020-09-08: 2 [IU] via SUBCUTANEOUS
  Administered 2020-09-08: 1 [IU] via SUBCUTANEOUS
  Administered 2020-09-09: 3 [IU] via SUBCUTANEOUS
  Administered 2020-09-09: 2 [IU] via SUBCUTANEOUS
  Administered 2020-09-09: 3 [IU] via SUBCUTANEOUS

## 2020-09-03 MED ORDER — AMLODIPINE BESYLATE 5 MG PO TABS: 5.0000 mg | ORAL_TABLET | Freq: Every evening | ORAL | Status: DC

## 2020-09-03 MED ORDER — FUROSEMIDE 10 MG/ML IJ SOLN: 40.0000 mg | Freq: Two times a day (BID) | INTRAMUSCULAR | Status: DC

## 2020-09-03 MED ORDER — ENOXAPARIN SODIUM 30 MG/0.3ML ~~LOC~~ SOLN: 30.0000 mg | SUBCUTANEOUS | Status: DC

## 2020-09-03 MED ORDER — BUPROPION HCL ER (SR) 150 MG PO TB12
150.0000 mg | ORAL_TABLET | Freq: Two times a day (BID) | ORAL | Status: DC
  Administered 2020-09-03 – 2020-09-07 (×8): 150 mg via ORAL
  Filled 2020-09-03 (×10): qty 1

## 2020-09-03 MED ORDER — ENOXAPARIN SODIUM 100 MG/ML ~~LOC~~ SOLN
90.0000 mg | Freq: Two times a day (BID) | SUBCUTANEOUS | Status: DC
  Administered 2020-09-03 – 2020-09-04 (×3): 90 mg via SUBCUTANEOUS
  Filled 2020-09-03: qty 1
  Filled 2020-09-03: qty 0.9
  Filled 2020-09-03: qty 1
  Filled 2020-09-03: qty 0.9
  Filled 2020-09-03: qty 1

## 2020-09-03 MED ORDER — FUROSEMIDE 10 MG/ML IJ SOLN
40.0000 mg | Freq: Once | INTRAMUSCULAR | Status: AC
Start: 2020-09-03 — End: 2020-09-03
  Administered 2020-09-03: 40 mg via INTRAVENOUS
  Filled 2020-09-03: qty 4

## 2020-09-03 MED ORDER — IOHEXOL 350 MG/ML SOLN
75.0000 mL | Freq: Once | INTRAVENOUS | Status: AC | PRN
  Administered 2020-09-03: 75 mL via INTRAVENOUS

## 2020-09-03 MED ORDER — METOPROLOL TARTRATE 5 MG/5ML IV SOLN
2.5000 mg | Freq: Once | INTRAVENOUS | Status: AC
  Administered 2020-09-03: 2.5 mg via INTRAVENOUS
  Filled 2020-09-03: qty 5

## 2020-09-03 MED ORDER — FUROSEMIDE 10 MG/ML IJ SOLN
80.0000 mg | Freq: Two times a day (BID) | INTRAMUSCULAR | Status: DC
  Administered 2020-09-03: 80 mg via INTRAVENOUS
  Filled 2020-09-03: qty 8

## 2020-09-03 NOTE — Progress Notes (Signed)
FPTS Interim Progress Note  S: Went to bedside to assess patient.  No concerns at this time.  He denies any shortness of breath while on oxygen.  He states that he is a mouth breather and that the nasal cannula kept falling out of his nose, so that is why he is on the nonrebreather mask.  Patient getting ready to sleep.  O: BP 102/68 (BP Location: Left Arm)   Pulse 61   Temp 97.8 F (36.6 C) (Oral)   Resp 20   SpO2 96%   General: Elderly male resting in bed comfortably, NAD CV: Mildly tachycardic, irregularly irregular rhythm, no murmurs Respiratory: Bibasilar rales appreciated, breathing comfortably on nonrebreather mask at 15 L Extremities: Warm and well perfused, no edema  Heart rate between 100 and 110 on the monitor SpO2 96% on the monitor  A/P: Acute hypoxemic respiratory failure Likely secondary to new onset CHF and A. fib.  Clinically stable at this time.  It appears he is on NRB rather than [place] out of preference.  Atrial fibrillation well controlled at this time without medications.  CTPA has resulted, no evidence of PE but did reveal evidence of pulmonary arterial hypertension with enlarged right atrium and right ventricle as well as possible right-sided pneumonia.  However, patient afebrile and without leukocytosis.  Will consider treatment for CAP if clinical status worsens. - consider CTX, azithro, and ABG if clinical worsening  [name], MD 09/03/2020, 9:29 PM PGY-1, [facility] pager [phone]

## 2020-09-03 NOTE — ED Triage Notes (Signed)
Reports exertional SOB for a few months that has increased over the last week.  Denies pain.  Reports vomiting phlegm 3 days ago.  O2 sats 77% on arrival. 91% on 6 liters.

## 2020-09-03 NOTE — H&P (Addendum)
[facility] Admission History and Physical Service Pager: [phone]  Patient name: Gary [name] Medical record number: [id number] Date of birth: [birth year] Age: 70 y.o. Gender: male  Primary Care Provider: Jalene Mullet, PA-C Consultants: cardiology  Code Status: Full  Preferred Emergency Contact: Diego Ulbricht (wife) [phone]  Chief Complaint: dyspnea   Assessment and Plan: Gary [name] is a 70 y.o. male presenting with worsening. PMH is significant for hypertension, DM, depression, history of epistaxis and hyperlipidemia.   Worsening dyspnea  Acute hypoxic respiratory failure  concern for new onset CHF Presented to the ED with worsening dyspnea, tachypneic at 24 and tachycardic to 123.  Initially satting at 77% on room air, required 4L O2 on admission although does not use oxygen at baseline.  Oxygen requirement did increase to 15 L nonrebreather. CXR notable for bibasilar atelectasis, edema or infiltrates are noted with probably small left pleural effusion with mild cardiomegaly noted. Labs notable for troponin 98. EKG notable for atrial fibrillation without notable ST changes.  Patient denies any orthopnea or paroxysmal nocturnal dyspnea, he states that he does sleep on a wedge pillow but has not had to sleep with any more elevation than normal.  He denies a history of COPD or any recent cough/fevers.   Patient denies having an echo in the past. On physical exam, patient noted to have diffuse crackles with presence of JVD. In the ED, given IV lasix 40 mg once along with metoprolol tartrate. Concern for new heart failure exacerbation given new oxygen requirement and physical exam findings, will acquire BNP.  Also on the differential includes PE which could still develop although previous CTA just a few months ago was negative for PE. Low concern for ACS given lack of chest pain, although elevated troponin may likely be due to demand ischemia.  Patient without a lot of  urine output after IV Lasix 40 mg was given.  He does not take any loop diuretics as a home medication.  Will order bladder scan to check for signs of outlet obstruction and will increase his dose of Lasix as below. -Admit to FPTS, progressive floor, attending Dr. Owens Shark -cardiology consulted, appreciate recommendations -S/p IV lasix 40 mg will increase to 80 mg IV -trend troponin -monitor I/Os -daily weights  -pending BNP -f/u echo  -am EKG -f/u am BMP, Mg and CBC -continuous pulse ox  -wean O2 as tolerated  -consider CTA if persistently tachycardic or symptomatic for PE -up with assistance -cardiac monitoring  -PT/OT eval and treat -Bladder scan as above  New onset atrial fibrillation EKG notable for atrial fibrillation on admission, possibly secondary to worsening heart failure. CHADS VASc score of 3 and HAS BLED score of 1.  Initial heart rate elevated in the 110-115 range, patient is status post metoprolol tartrate 2.5 mg given in the ED. Most recent rate was appropriate at 98 bpm. -cardiology consulted, appreciate recommendations  -lovenox 90 mg bid daily -am EKG -f/u echo -f/u TSH -Continue to monitor rate, rate control as needed  Elevated troponin Troponin 98 on admission, likely due to demand ischemia.  Patient without complaints of chest pain.  EKG with some T wave inversions in the lateral leads, [name] previous EKG to compare to. -trend trops -Repeat EKG in AM   Hypertension BP 128/80 on admission, home medications include amlodipine 5 mg daily and enalapril-HCTZ 10-25 mg daily. -hold home amlodipine -hold hone enalapril-HCTZ given AKI -monitor BP  Hyperglycemia  DM Hyperglycemic on admission with blood glucose of  116. Last known A1c unknown per chart review. Home medications include glipizide 10 mg daily. 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  Likely due to CHF  exacerbation and atrial fibrillation, would expect to improve with diuresis. -monitor am BMP -hold home enalapril/HCTZ -bladder scan to rule out possible obstructive causes   Hyponatremia Na 134 on admission, likely secondary to volume overload. Expected to correct with diuresis.  -am BMP -will hold off on IVF given concern for possible heart failure -monitor for mental status changes  History of epistaxis Notes history of epistaxis in 2014 that required embolism.  Hyperlipidemia [name] recent lipid panel per chart review. Home medications include atorvastatin 20 mg, although dose is unclear.  -f/u lipid panel -restart statin once med rec complete   Depression Home meds include bupropion 150 mg bid. -continue home med  FEN/GI: heart healthy/carb modified  Prophylaxis: lovenox (full dose anticoagulation)  Disposition: admit to progressive, attending Dr. Owens Shark   History of Present Illness:  Gary [name] is a 70 y.o. male presenting with several months duration of dyspnea that has progressively further worsened over the past 4 days. Wife is present at bedside and also contributes to history taking. Notes that this dyspnea only occurs with exertion. Discussed this with his PCP in February when they performed a CTA which was normal per wife. Patient states that although not much has changed, he finally decided to come to the ED because he was getting fed up with feeling short of breath for this long.   Denies chest pain, swelling, fever, recent sickness, weakness, orthopnea, chills, night sweats, nausea, vomiting, hemoptysis and any other associated pain. Patient endorses cough with white phlegm but reports that this is not new since he has chronic sinusitis and has not noted any differences in this cough that he can attribute to this dyspnea. He is able to ambulate without assistance of wheelchair, cane or walker.   Endorses occasional alcohol use, drinks 1-2 cans of beer a month. Former  smoker, has tried to quit smoking multiple times and most recently quit in 2005. Denies any recreational drug use.    Review Of Systems: Per HPI with the following additions:   Review of Systems  Constitutional: Negative for chills and fever.  Eyes: Negative for visual disturbance.  Respiratory: Positive for cough and shortness of breath.   Cardiovascular: Negative for chest pain and leg swelling.  Gastrointestinal: Negative for abdominal pain, blood in stool, nausea and vomiting.  Musculoskeletal: Negative for gait problem.  Neurological: Negative for dizziness, weakness and headaches.  Psychiatric/Behavioral: Negative for confusion.     Patient Active Problem List   Diagnosis Date Noted  . CHF exacerbation ([place]) 09/03/2020    Past Medical History: Past Medical History:  Diagnosis Date  . Diabetes mellitus without complication ([place])   . Epistaxis   . Hypertension     Past Surgical History: Past Surgical History:  Procedure Laterality Date  . HIP SURGERY      Social History: Social History   Tobacco Use  . Smoking status: Never Smoker  . Smokeless tobacco: Never Used  Substance Use Topics  . Alcohol use: [name]  . Drug use: [name]   Additional social history:  Please also refer to relevant sections of EMR.  Family History: [name] family history on file.   Allergies and Medications: Allergies  Allergen Reactions  . Novocain [Procaine]  [name] current facility-administered medications on file prior to encounter.   Current Outpatient Medications on File Prior to Encounter  Medication Sig Dispense Refill  . acetaminophen (TYLENOL) 325 MG tablet Take 650 mg by mouth every 6 (six) hours as needed.    Marland Kitchen amLODipine (NORVASC) 5 MG tablet Take 5 mg by mouth every evening.    Marland Kitchen atorvastatin (LIPITOR) 40 MG tablet Take 40 mg by mouth every evening.    Marland Kitchen buPROPion (WELLBUTRIN XL) 150 MG 24 hr tablet Take 150 mg by mouth 2 (two) times daily.    . Calcium Carbonate-Vitamin D  (CALCIUM + D PO) Take 1 tablet by mouth 2 (two) times daily.    . enalapril-hydrochlorothiazide (VASERETIC) 10-25 MG per tablet Take 1 tablet by mouth every morning.    Marland Kitchen glipiZIDE (GLUCOTROL XL) 10 MG 24 hr tablet Take 10 mg by mouth every morning.    . Multiple Vitamin (MULTIVITAMIN WITH MINERALS) TABS tablet Take 1 tablet by mouth daily.    . pioglitazone-metformin (ACTOPLUS MET) 15-850 MG per tablet Take 1-2 tablets by mouth 2 (two) times daily with a meal. Take two tablets in the morning, and one in the evening.      Objective: BP 116/77   Pulse (!) 123   Temp 98 F (36.7 C) (Oral)   Resp (!) 24   SpO2 96%  Exam: General: Patient sitting upright in bed, in [name] acute distress, nonrebreather in place. Eyes: sclera white Neck: supple, [name] evidence of lymphadenopathy, non-tender thyroid, JVD noted Cardiovascular: irregularly irregular, [name] murmurs or gallops auscultated  Respiratory: diffuse crackles noted posteriorly in all lung lobes, breathing without distress on 15 L O2 nonrebreather Gastrointestinal: soft, nontender, abdominal hernia noted Ext: radial and distal pulses strong and equal bilaterally, [name] LE edema noted bilaterally, [name] calf tenderness bilaterally    Derm: skin warm and dry to touch, [name] rashes or lesions noted  Neuro: AOx4, appropriately conversional, follows all commands  Psych: mood appropriate   Labs and Imaging: CBC BMET  Recent Labs  Lab 09/03/20 1040  WBC 8.1  HGB 14.6  HCT 44.1  PLT 323   Recent Labs  Lab 09/03/20 1040  NA 134*  K 4.5  CL 99  CO2 21*  BUN 41*  CREATININE 1.46*  GLUCOSE 116*  CALCIUM 8.8*     EKG: HR 100, irregularly irregular rhythm consistent with atrial fibrillation, T wave inversions noted, QTc 352, possible right bundle branch block, [name] notable ST elevations  DG Chest Portable 1 View  Result Date: 09/03/2020 CLINICAL DATA:  Dyspnea on exertion. EXAM: PORTABLE CHEST 1 VIEW COMPARISON:  None. FINDINGS: Mild cardiomegaly is  noted. [name] pneumothorax is noted. Bibasilar atelectasis, edema or infiltrates are noted with probable small left pleural effusion. Bony thorax is unremarkable. IMPRESSION: Bibasilar atelectasis, edema or infiltrates are noted with probable small left pleural effusion. Electronically Signed   By: Marijo Conception M.D.   On: 09/03/2020 10:32    Donney Dice, DO 09/03/2020, 2:07 PM PGY-1, [facility] pager: [phone], text pages welcome  Upper Level Addendum:  I have seen and evaluated this patient along with Dr. Larae Grooms and reviewed the above note, making necessary revisions as appropriate.  I agree with the medical decision making and physical exam as noted above.  Lurline Del, DO PGY-2  [facility]

## 2020-09-03 NOTE — Consult Note (Addendum)
Cardiology Consultation:   Patient ID: Gary [name] MRN: [id number]; DOB: 1951-03-09  Admit date: 09/03/2020 Date of Consult: 09/03/2020  PCP:  Riley Lam   Gary [name]  Cardiologist:  No primary care provider on file. new Advanced Practice Provider:  No care team member to display Electrophysiologist:  None  [id number]    Patient Profile:   Gary [name] is a 70 y.o. male with a hx of DM, HTN, HLD, obesity, who is being seen today for the evaluation of atrial fib and CHF at the request of Dr Owens Shark.  History of Present Illness:   Gary [name] has had intermittent resp problems since Feb, but sx have worsened in the last couple of weeks.  He has had worsening DOE, can now only walk about 15 feet w/out getting SOB. Sleeps on a wedge chronically, denies PND. Denies LE edema.  Has not gained any weight but has been having problems recently with early satiety and p.o. intake is down.  He never gets chest pain. Prior to this acute illness, he was able to do yard work and mow, no SOB or chest pain. Started noticing SOB picking up limbs back when the weather was bad. Sx gradually progressed.  He has no awareness of the atrial fib. Has never felt any palpitations. Checks his BP occasionally, but has never noticed an abnl heart rate.   No presyncope or syncope.  Has been vaccinated against Covid 19     Past Medical History:  Diagnosis Date  . Diabetes mellitus without complication ([place])   . Epistaxis 2014   Secondary to a nasal abnormality, treated and resolved  . Hyperlipidemia due to type 2 diabetes mellitus ([place])   . Hypertension     Past Surgical History:  Procedure Laterality Date  . NOSE SURGERY  2014  . PELVIC FRACTURE SURGERY  2013     Home Medications:  Prior to Admission medications   Medication Sig Start Date End Date Taking? Authorizing Provider  acetaminophen (TYLENOL) 325 MG tablet Take 650 mg by mouth every 6 (six) hours as needed.     [provider]  amLODipine (NORVASC) 5 MG tablet Take 5 mg by mouth every evening.    [provider]  atorvastatin (LIPITOR) 40 MG tablet Take 40 mg by mouth every evening.    [provider]  buPROPion (WELLBUTRIN XL) 150 MG 24 hr tablet Take 150 mg by mouth 2 (two) times daily.    [provider]  Calcium Carbonate-Vitamin D (CALCIUM + D PO) Take 1 tablet by mouth 2 (two) times daily.    [provider]  enalapril-hydrochlorothiazide (VASERETIC) 10-25 MG per tablet Take 1 tablet by mouth every morning.    [provider]  glipiZIDE (GLUCOTROL XL) 10 MG 24 hr tablet Take 10 mg by mouth every morning.    [provider]  Multiple Vitamin (MULTIVITAMIN WITH MINERALS) TABS tablet Take 1 tablet by mouth daily.    [provider]  pioglitazone-metformin (ACTOPLUS MET) 15-850 MG per tablet Take 1-2 tablets by mouth 2 (two) times daily with a meal. Take two tablets in the morning, and one in the evening.    [provider]    Inpatient Medications: Scheduled Meds: . amLODipine  5 mg Oral QPM  . buPROPion  150 mg Oral BID  . enoxaparin (LOVENOX) injection  90 mg Subcutaneous Q12H  . insulin aspart  0-9 Units Subcutaneous TID WC   Continuous Infusions:  PRN Meds:  Allergies:    Allergies  Allergen Reactions  . Novocain [Procaine]     Social History:   Social History   Socioeconomic History  . Marital status: Married    Spouse name: Not on file  . Number of children: Not on file  . Years of education: Not on file  . Highest education level: Not on file  Occupational History  . Occupation: Retired Arts administrator  Tobacco Use  . Smoking status: Never Smoker  . Smokeless tobacco: Never Used  Substance and Sexual Activity  . Alcohol use: No  . Drug use: No  . Sexual activity: Not on file  Other Topics Concern  . Not on file  Social History Narrative  . Not on file   Social Determinants of Health    Financial Resource Strain: Not on file  Food Insecurity: Not on file  Transportation Needs: Not on file  Physical Activity: Not on file  Stress: Not on file  Social Connections: Not on file  Intimate Partner Violence: Not on file    Family History:   Family History  Problem Relation Age of Onset  . Diabetes Mother   . Alzheimer's disease Mother   . Stroke Father   . Heart Problems Father        had a pacemaker  . Diabetes Brother     Family Status  Relation Name Status  . Mother  Deceased  . Father  Deceased  . Sister  Alive  . Brother  Alive     ROS:  Please see the history of present illness.  All other ROS reviewed and negative.     Physical Exam/Data:   Vitals:   09/03/20 1215 09/03/20 1230 09/03/20 1245 09/03/20 1430  BP: 111/87 118/90 116/77 (!) 116/104  Pulse: 62 (!) 113 (!) 123 84  Resp: 19 (!) 25 (!) 24 (!) 21  Temp:      TempSrc:      SpO2: 90% 98% 96% 97%   No intake or output data in the 24 hours ending 09/03/20 1617 Last 3 Weights 05/14/2013  Weight (lbs) 232 lb  Weight (kg) 105.235 kg     There is no height or weight on file to calculate BMI.  General:  Well nourished, well developed, in moderate respiratory distress, improved on NRB HEENT: normal Lymph: no adenopathy Neck: JVD 11 cm Endocrine:  No thryomegaly Vascular: No carotid bruits; 4/4 extremity pulses 2+  Cardiac:  normal S1, S2; irregular rate and rhythm; no murmur  Lungs: Decreased breath sounds bases with some rales bilaterally, no wheezing, rhonchi Abd: soft, nontender, no hepatomegaly  Ext: no edema Musculoskeletal:  No deformities, BUE and BLE strength normal and equal Skin: warm and dry  Neuro:  CNs 2-12 intact, no focal abnormalities noted Psych:  Normal affect   EKG:  The EKG was personally reviewed and demonstrates:  Atrial fib, RVR, HR 107, RBBB, no old available for comparison Telemetry:  Telemetry was personally reviewed and demonstrates:  Atrial fib, heart rate  controlled  Relevant CV Studies:  ECHO: ordered  Laboratory Data:  High Sensitivity Troponin:   Recent Labs  Lab 09/03/20 1040 09/03/20 1433  TROPONINIHS 98* 101*     Chemistry Recent Labs  Lab 09/03/20 1040  NA 134*  K 4.5  CL 99  CO2 21*  GLUCOSE 116*  BUN 41*  CREATININE 1.46*  CALCIUM 8.8*  GFRNONAA 51*  ANIONGAP 14    No results for input(s): PROT, ALBUMIN, AST, ALT, ALKPHOS, BILITOT  in the last 168 hours. Hematology Recent Labs  Lab 09/03/20 1040  WBC 8.1  RBC 4.69  HGB 14.6  HCT 44.1  MCV 94.0  MCH 31.1  MCHC 33.1  RDW 14.5  PLT 323   BNP Recent Labs  Lab 09/03/20 1433  BNP 1,202.7*    No results found for: TSH  Lab Results  Component Value Date   HGBA1C 6.7 (H) 09/03/2020   No results found for: CHOL, HDL, LDLCALC, LDLDIRECT, TRIG, CHOLHDL   Radiology/Studies:  DG Chest Portable 1 View  Result Date: 09/03/2020 CLINICAL DATA:  Dyspnea on exertion. EXAM: PORTABLE CHEST 1 VIEW COMPARISON:  None. FINDINGS: Mild cardiomegaly is noted. No pneumothorax is noted. Bibasilar atelectasis, edema or infiltrates are noted with probable small left pleural effusion. Bony thorax is unremarkable. IMPRESSION: Bibasilar atelectasis, edema or infiltrates are noted with probable small left pleural effusion. Electronically Signed   By: Marijo Conception M.D.   On: 09/03/2020 10:32     Assessment and Plan:   1. Atrial fib, RVR -Heart rate was above 100 but he had Lopressor 2.5 mg IV and heart rate is improved - Unknown duration, he is not aware of the arrhythmia - Follow-up on echo and decide on plan, feel he would benefit from sinus rhythm  2.  Acute CHF: - Dyspnea on exertion has progressed to shortness of breath at rest - He is meeting O2 sats on NRB - No recent dietary changes or acute illness to account for the symptoms - Follow-up on echo to determine type of CHF and adjust meds - Continue diuresis for now - He has not had significant urine output  since getting Lasix 40 mg at 1435. -Discuss giving another 40 mg with MD -Creatinine of 1.10 in 2014 and 1.46 today, follow with diuresis   Risk Assessment/Risk Scores:        New York Heart Association (NYHA) Functional Class NYHA Class IV  CHA2DS2-VASc Score = 4  This indicates a 4.8% annual risk of stroke. The patient's score is based upon: CHF History: Yes HTN History: Yes Diabetes History: Yes Stroke History: No Vascular Disease History: No Age Score: 1 Gender Score: 0         For questions or updates, please contact [place] Please consult [email or web address] for contact info under    Signed, Rosaria Ferries, PA-C  09/03/2020 4:17 PM   Patient seen and examined with Rosaria Ferries, PA-C.  Agree as above, with the following exceptions and changes as noted below.  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  He notes that his shortness of breath has been slowly worsening without acute crescendo.  He denies significant chest discomfort.  He has had a sleep study in the past that was negative for OSA though was borderline.  His wife in the room states that he does not frequently snore.  No known history of lung disease. Gen: NA, mildly increased work of breathing , CV: iRRR, no murmurs, Lungs: Diffuse crackles, Abd: soft, Extrem: Warm, well perfused, no edema, Neuro/Psych: alert and oriented x 3, normal mood and affect. All available labs, radiology testing, previous records reviewed.   I reviewed the images of his echocardiogram at the bedside with the sonographer [facility].  LV function is grossly preserved.  RV size is severely enlarged with positive McConnell sign (relative preservation of  apical RV function with basal and mid hypokinesis).  There is at least moderate TR.  Patient has been started on  therapeutic dose Lovenox for anticoagulation for atrial fibrillation.  We will need to assess the patient for pulmonary embolism given the appearance of his echocardiogram.  If negative would pursue further work-up of primary lung disease as well as obstructive sleep apnea though the wife states that he does not routinely snore.  RV failure could also be secondary to ischemic heart disease, but troponins are modestly elevated and flat.  He does have coronary calcifications reported on the CT scan from care everywhere.  I am unable to independently review these images, but we will review the images from his CT pending today.  He received a low-dose of IV Lopressor for heart rate control.  His heart rates are acceptable right now, could use as needed Lopressor.  Unless there is a prohibitive bleeding risk would recommend continued anticoagulation for atrial fibrillation.    Elouise Munroe, MD 09/03/20 6:01 PM

## 2020-09-03 NOTE — Progress Notes (Signed)
  Echocardiogram 2D Echocardiogram has been performed.  Merrie Roof F 09/03/2020, 5:45 PM

## 2020-09-03 NOTE — ED Provider Notes (Addendum)
[facility] EMERGENCY DEPARTMENT Provider Note   CSN: [id number] Arrival date & time: 09/03/20  1093     History Chief Complaint  Patient presents with  . Shortness of Breath    Gary [name] is a 70 y.o. male.  HPI      70yo male with history of DM and hypertension presents with concern for shortness of breath.  Reports progressive shortness of breath that began the beginning of February.  At that time, he was seen by his primary care physician and had work-up including labs and a CT PE study which showed no evidence of pulmonary embolus.  Since that time he has had progressive worsening of his shortness of breath, and it has significantly worsened over the last couple days.  Reports he has had a cough productive of clear sputum.  Reports he always sleeps on a wedge pillow, and that has not changed.  Denies leg swelling.  Denies fevers or chest pain.  Denies asymmetric leg swelling.  No known sick contacts.  Has some chronic nasal congestion without significant change.  He had some nausea and vomiting 3 days ago but denies any continuing vomiting.  No known history of atrial fibrillation.  Past Medical History:  Diagnosis Date  . Diabetes mellitus without complication ([place])   . Epistaxis 2014   Secondary to a nasal abnormality, treated and resolved  . Hyperlipidemia due to type 2 diabetes mellitus ([place])   . Hypertension     Patient Active Problem List   Diagnosis Date Noted  . CHF exacerbation ([place]) 09/03/2020  . Atrial fibrillation with rapid ventricular response ([place]) 09/03/2020  . Acute hypoxemic respiratory failure ([place]) 09/03/2020  . Right bundle branch block 09/03/2020  . Hyperlipidemia due to type 2 diabetes mellitus [facility])     Past Surgical History:  Procedure Laterality Date  . NOSE SURGERY  2014  . PELVIC FRACTURE SURGERY  2013       Family History  Problem Relation Age of Onset  . Diabetes Mother   . Alzheimer's disease Mother   . Stroke  Father   . Heart Problems Father        had a pacemaker  . Diabetes Brother     Social History   Tobacco Use  . Smoking status: Never Smoker  . Smokeless tobacco: Never Used  Substance Use Topics  . Alcohol use: No  . Drug use: No    Home Medications Prior to Admission medications   Medication Sig Start Date End Date Taking? Authorizing Provider  acetaminophen (TYLENOL) 325 MG tablet Take 650 mg by mouth every 6 (six) hours as needed for headache (pain).   Yes [provider]  amLODipine (NORVASC) 5 MG tablet Take 5 mg by mouth every morning.   Yes [provider]  aspirin EC 81 MG tablet Take 81 mg by mouth every morning. Swallow whole.   Yes [provider]  atorvastatin (LIPITOR) 40 MG tablet Take 40 mg by mouth every evening.   Yes [provider]  buPROPion (WELLBUTRIN SR) 150 MG 12 hr tablet Take 150 mg by mouth 2 (two) times daily. 08/01/20  Yes [provider]  enalapril-hydrochlorothiazide (VASERETIC) 10-25 MG per tablet Take 1 tablet by mouth at bedtime.   Yes [provider]  glipiZIDE (GLUCOTROL XL) 10 MG 24 hr tablet Take 10 mg by mouth every morning.   Yes [provider]  metFORMIN (GLUCOPHAGE) 850 MG tablet Take 850-1,700 mg by mouth See admin instructions.  Take 2 tablets (1700 mg) by mouth every morning and 1 tablet (850 mg) at night 08/01/20  Yes [provider]  Multiple Vitamin (MULTIVITAMIN WITH MINERALS) TABS tablet Take 1 tablet by mouth daily.   Yes [provider]  pioglitazone (ACTOS) 15 MG tablet Take 15 mg by mouth 2 (two) times daily.   Yes [provider]  buPROPion (WELLBUTRIN XL) 150 MG 24 hr tablet Take 150 mg by mouth 2 (two) times daily. Patient not taking: No sig reported    [provider]    Allergies    Novocain [procaine]  Review of Systems   Review of Systems  Constitutional: Negative for fever.  HENT: Positive for congestion. Negative for  sore throat.   Eyes: Negative for visual disturbance.  Respiratory: Positive for cough and shortness of breath.   Cardiovascular: Negative for chest pain.  Gastrointestinal: Negative for abdominal pain. Nausea: days ago. Vomiting: days ago.  Genitourinary: Negative for difficulty urinating.  Musculoskeletal: Negative for back pain and neck stiffness.  Skin: Negative for rash.  Neurological: Negative for syncope and headaches.    Physical Exam Updated Vital Signs BP 104/78 (BP Location: Left Arm)   Pulse 87   Temp 98.4 F (36.9 C) (Axillary)   Resp (!) 23   Ht 5' 7"  (1.702 m)   Wt 86.3 kg Comment: scale c  SpO2 94%   BMI 29.81 kg/m   Physical Exam Vitals and nursing note reviewed.  Constitutional:      General: He is not in acute distress.    Appearance: He is well-developed. He is not diaphoretic.  HENT:     Head: Normocephalic and atraumatic.  Eyes:     Conjunctiva/sclera: Conjunctivae normal.  Cardiovascular:     Rate and Rhythm: Tachycardia present. Rhythm irregular.     Heart sounds: Normal heart sounds. No murmur heard. No friction rub. No gallop.   Pulmonary:     Effort: Pulmonary effort is normal. No respiratory distress.     Breath sounds: Decreased breath sounds present. No wheezing or rales.  Abdominal:     General: There is no distension.     Palpations: Abdomen is soft.     Tenderness: There is no abdominal tenderness. There is no guarding.  Musculoskeletal:     Cervical back: Normal range of motion.  Skin:    General: Skin is warm and dry.  Neurological:     Mental Status: He is alert and oriented to person, place, and time.     ED Results / Procedures / Treatments   Labs (all labs ordered are listed, but only abnormal results are displayed) Labs Reviewed  BASIC METABOLIC PANEL - Abnormal; Notable for the following components:      Result Value   Sodium 134 (*)    CO2 21 (*)    Glucose, Bld 116 (*)    BUN 41 (*)    Creatinine, Ser 1.46 (*)     Calcium 8.8 (*)    GFR, Estimated 51 (*)    All other components within normal limits  BRAIN NATRIURETIC PEPTIDE - Abnormal; Notable for the following components:   B Natriuretic Peptide 1,202.7 (*)    All other components within normal limits  HEMOGLOBIN A1C - Abnormal; Notable for the following components:   Hgb A1c MFr Bld 6.7 (*)    All other components within normal limits  BASIC METABOLIC PANEL - Abnormal; Notable for the following components:   BUN 45 (*)    Creatinine,  Ser 1.57 (*)    Calcium 8.7 (*)    GFR, Estimated 47 (*)    All other components within normal limits  BASIC METABOLIC PANEL - Abnormal; Notable for the following components:   Sodium 134 (*)    BUN 49 (*)    Creatinine, Ser 1.63 (*)    Calcium 8.5 (*)    GFR, Estimated 45 (*)    All other components within normal limits  LIPID PANEL - Abnormal; Notable for the following components:   HDL 30 (*)    All other components within normal limits  GLUCOSE, CAPILLARY - Abnormal; Notable for the following components:   Glucose-Capillary 114 (*)    All other components within normal limits  TROPONIN I (HIGH SENSITIVITY) - Abnormal; Notable for the following components:   Troponin I (High Sensitivity) 98 (*)    All other components within normal limits  TROPONIN I (HIGH SENSITIVITY) - Abnormal; Notable for the following components:   Troponin I (High Sensitivity) 101 (*)    All other components within normal limits  TROPONIN I (HIGH SENSITIVITY) - Abnormal; Notable for the following components:   Troponin I (High Sensitivity) 98 (*)    All other components within normal limits  RESP PANEL BY RT-PCR (FLU A&B, COVID) ARPGX2  CBC  TSH  CBC  MAGNESIUM  GLUCOSE, CAPILLARY  GLUCOSE, CAPILLARY    EKG EKG Interpretation  Date/Time:  Monday September 03 2020 10:07:45 EDT Ventricular Rate:  107 PR Interval:    QRS Duration: 126 QT Interval:  352 QTC Calculation: 469 R Axis:   174 Text Interpretation: Atrial  fibrillation with rapid ventricular response Indeterminate axis Right bundle branch block Possible Anteroseptal infarct , age undetermined T wave abnormality, consider lateral ischemia Abnormal ECG No previous ECGs available Confirmed by Gareth Morgan [phone]) on 09/03/2020 1:03:20 PM   Radiology CT ANGIO CHEST PE W OR WO CONTRAST  Result Date: 09/03/2020 CLINICAL DATA:  RIGHT ventricular dilatation, commas sign, worsening dyspnea, atrial fibrillation, suspected pulmonary embolism; history diabetes mellitus, hyperlipidemia, hypertension EXAM: CT ANGIOGRAPHY CHEST WITH CONTRAST TECHNIQUE: Multidetector CT imaging of the chest was performed using the standard protocol during bolus administration of intravenous contrast. Multiplanar CT image reconstructions and MIPs were obtained to evaluate the vascular anatomy. CONTRAST:  84m OMNIPAQUE IOHEXOL 350 MG/ML SOLN IV COMPARISON:  06/27/2020 FINDINGS: Cardiovascular: Atherosclerotic calcifications aorta, proximal great vessels, and coronary arteries. Ascending thoracic aorta upper normal caliber 3.9 cm diameter. Enlargement of cardiac chambers especially RIGHT atrium and RIGHT ventricle. No pericardial effusion. Pulmonary arteries adequately opacified and patent. No evidence of pulmonary embolism. Enlargement of central pulmonary arteries consistent with pulmonary arterial hypertension. Mediastinum/Nodes: Esophagus unremarkable. Base of cervical region normal appearance. Normal sized RIGHT paratracheal lymph nodes up to 10 mm diameter single enlarged subcarinal node 1.8 cm image 38 previously 1.4 cm. Lungs/Pleura: Small BILATERAL pleural effusions greater on RIGHT. Patchy infiltrates RIGHT lung favor pneumonia. An area of opacity at the RIGHT middle lobe was present on the previous exam, little changed, 2.7 x 1.2 X 2.5 cm. Minimal dependent atelectasis LEFT lung with LEFT lung otherwise clear. Upper Abdomen: Reflux of contrast into IVC and hepatic veins. Visualized  upper abdomen otherwise unremarkable. Musculoskeletal: Degenerative disc disease changes thoracic spine. Review of the MIP images confirms the above findings. IMPRESSION: No evidence of pulmonary embolism. Enlarged central pulmonary arteries consistent with pulmonary arterial hypertension. Associated enlargement of cardiac chambers especially RIGHT ventricle and RIGHT atrium. Small BILATERAL pleural effusions greater on RIGHT. Patchy infiltrates RIGHT  lung favor pneumonia. Persistent area of opacity in the RIGHT middle lobe could represent atelectasis, unresolved infiltrate, tumor not excluded; recommend continued follow-up until resolution and if this fails to resolve then consider further assessment by PET-CT. Single enlarged subcarinal lymph node 1.8 cm diameter, previously 1.4 cm, nonspecific. Aortic Atherosclerosis (ICD10-I70.0). Electronically Signed   By: Lavonia Dana M.D.   On: 09/03/2020 20:48   DG Chest Portable 1 View  Result Date: 09/03/2020 CLINICAL DATA:  Dyspnea on exertion. EXAM: PORTABLE CHEST 1 VIEW COMPARISON:  None. FINDINGS: Mild cardiomegaly is noted. No pneumothorax is noted. Bibasilar atelectasis, edema or infiltrates are noted with probable small left pleural effusion. Bony thorax is unremarkable. IMPRESSION: Bibasilar atelectasis, edema or infiltrates are noted with probable small left pleural effusion. Electronically Signed   By: Marijo Conception M.D.   On: 09/03/2020 10:32   ECHOCARDIOGRAM COMPLETE  Result Date: 09/03/2020    ECHOCARDIOGRAM REPORT   Patient Name:   WADIE LIEW Date of Exam: 09/03/2020 Medical Rec #:  [id number]   Height:       67.5 in Accession #:    [id number]  Weight:       232.0 lb Date of Birth:  1951/03/30   BSA:          2.166 m Patient Age:    33 years    BP:           98/80 mmHg Patient Gender: M           HR:           89 bpm. Exam Location:  Inpatient Procedure: 2D Echo, Cardiac Doppler and Color Doppler Indications:    I48.91* Unspeicified atrial  fibrillation  History:        Patient has no prior history of Echocardiogram examinations.                 Risk Factors:Former Smoker.  Sonographer:    Merrie Roof RDCS Referring Phys: [id number] CARINA M BROWN IMPRESSIONS  1. There is severe RV enlargement with severely depressed RV systolic function and moderately elevated PASP. The interventricular septum is flattened in systole consistent with RV pressure overload. Constellation of findings highly concerning for pulmonary embolism. Cardiology team aware and CTA pending.  2. Left ventricular ejection fraction, by estimation, is 55 to 60%. The left ventricle has normal function. Wall motion difficult to assess due to poor visualization of the LV endocardium but appears grossly normal. There is mild concentric left ventricular hypertrophy. Diastolic function is indeterminant due to atrial fibrillation. There is the interventricular septum is flattened in systole, consistent with right ventricular pressure overload.  3. Right ventricular systolic function is severely reduced. The right ventricular size is severely enlarged. There is moderately elevated pulmonary artery systolic pressure. The estimated right ventricular systolic pressure is 71.0 mmHg.  4. Right atrial size was moderately dilated.  5. The mitral valve is grossly normal. Mild mitral valve regurgitation.  6. Tricuspid valve regurgitation is moderate.  7. The aortic valve is tricuspid. There is moderate calcification of the aortic valve. There is moderate thickening of the aortic valve. Aortic valve regurgitation is trivial. Mild to moderate aortic valve sclerosis/calcification is present, without any  evidence of aortic stenosis.  8. The inferior vena cava is dilated in size with <50% respiratory variability, suggesting right atrial pressure of 15 mmHg. Comparison(s): No prior Echocardiogram. FINDINGS  Left Ventricle: Left ventricular ejection fraction, by estimation, is 55 to 60%. The left ventricle has  normal function.  Wall motion diffiicult to assess due to poor visualization of LV endocardium but appears grossly normal. The left ventricular internal cavity size was normal in size. There is mild concentric left ventricular hypertrophy. The interventricular septum is flattened in systole, consistent with right ventricular pressure overload. Diastolic function is indeterminant due to atrial fibrillation. Right Ventricle: The right ventricular size is severely enlarged. Right vetricular wall thickness was not assessed. Right ventricular systolic function is severely reduced. There is moderately elevated pulmonary artery systolic pressure. The tricuspid regurgitant velocity is 3.33 m/s, and with an assumed right atrial pressure of 15 mmHg, the estimated right ventricular systolic pressure is 46.2 mmHg. Left Atrium: Left atrial size was normal in size. Right Atrium: Right atrial size was moderately dilated. Pericardium: There is no evidence of pericardial effusion. Presence of pericardial fat pad. Mitral Valve: The mitral valve is grossly normal. There is mild thickening of the mitral valve leaflet(s). There is mild calcification of the mitral valve leaflet(s). Mild mitral annular calcification. Mild mitral valve regurgitation. Tricuspid Valve: The tricuspid valve is normal in structure. Tricuspid valve regurgitation is moderate. Aortic Valve: The aortic valve is tricuspid. There is moderate calcification of the aortic valve. There is moderate thickening of the aortic valve. Aortic valve regurgitation is trivial. Mild to moderate aortic valve sclerosis/calcification is present, without any evidence of aortic stenosis. Pulmonic Valve: The pulmonic valve was normal in structure. Pulmonic valve regurgitation is not visualized. Aorta: The aortic root is normal in size and structure. Venous: The inferior vena cava is dilated in size with less than 50% respiratory variability, suggesting right atrial pressure of 15 mmHg.  IAS/Shunts: No atrial level shunt detected by color flow Doppler.  LEFT VENTRICLE PLAX 2D LVIDd:         4.90 cm  Diastology LVIDs:         3.40 cm  LV e' lateral: 7.29 cm/s LV PW:         1.10 cm LV IVS:        1.00 cm LVOT diam:     2.00 cm LV SV:         34 LV SV Index:   16 LVOT Area:     3.14 cm  RIGHT VENTRICLE            IVC RV Basal diam:  4.70 cm    IVC diam: 2.70 cm RV Mid diam:    5.10 cm RV S prime:     9.57 cm/s TAPSE (M-mode): 0.6 cm LEFT ATRIUM             Index       RIGHT ATRIUM           Index LA diam:        4.20 cm 1.94 cm/m  RA Area:     23.30 cm LA Vol (A2C):   71.7 ml 33.10 ml/m RA Volume:   75.30 ml  34.77 ml/m LA Vol (A4C):   61.2 ml 28.26 ml/m LA Biplane Vol: 72.4 ml 33.43 ml/m  AORTIC VALVE LVOT Vmax:   74.40 cm/s LVOT Vmean:  51.400 cm/s LVOT VTI:    0.109 m  AORTA Ao Root diam: 3.30 cm TRICUSPID VALVE TR Peak grad:   44.4 mmHg TR Vmax:        333.00 cm/s  SHUNTS Systemic VTI:  0.11 m Systemic Diam: 2.00 cm Gwyndolyn Kaufman MD Electronically signed by Gwyndolyn Kaufman MD Signature Date/Time: 09/03/2020/8:05:33 PM    Final     Procedures  Procedures   Medications Ordered in ED Medications  enoxaparin (LOVENOX) injection 90 mg (90 mg Subcutaneous Given 09/04/20 0554)  buPROPion (WELLBUTRIN SR) 12 hr tablet 150 mg (150 mg Oral Given 09/03/20 2109)  insulin aspart (novoLOG) injection 0-9 Units (0 Units Subcutaneous Not Given 09/04/20 0555)  metoprolol tartrate (LOPRESSOR) injection 2.5 mg (2.5 mg Intravenous Given 09/03/20 1431)  furosemide (LASIX) injection 40 mg (40 mg Intravenous Given 09/03/20 1435)  iohexol (OMNIPAQUE) 350 MG/ML injection 75 mL (75 mLs Intravenous Contrast Given 09/03/20 2020)    ED Course  I have reviewed the triage vital signs and the nursing notes.  Pertinent labs & imaging results that were available during my care of the patient were reviewed by me and considered in my medical decision making (see chart for details).    MDM  Rules/Calculators/A&P                          70yo male with history of DM, epistaxis needing embolizatoin, and hypertension presents with concern for shortness of breath. Oxygen saturations to 70s on arrival, improved with O2 6L, then placed on nonrebarether and tolerating. Differential diagnosis for dyspnea includes ACS, PE, COPD exacerbation, CHF exacerbation, anemia, pneumonia, viral etiology such as COVID 19 infection, metabolic abnormality.  Chest x-ray was done which showed edema or infiltrates with probable small left pleural effusion. EKG was evaluated by me which showed atrial fibrillation with elevated rate which is new from prior. Denies CP, troponin 98, noted to be 89 as an outpatient in February, doubt ACS.  Doubt pneumonia given no fever, no leukocytosis.  Consider PE given new onset atrial fibrillation, but symptoms did began 2 months ago and he had a negative PE study at that time--overall feel history and exam are most consistent with congestive heart failure and atrial fibrillation, and discussed with admitting team and will see how he improves clinically with treatment.  Given metoprolol 2.14m, lovenox and 44mlasix [facility]ib [facility]    Final Clinical Impression(s) / ED Diagnoses Final diagnoses:  Acute congestive heart failure, unspecified [facility]   Rx / DC Orders ED Discharge Orders    None       ScGareth MorganMD 09/04/20 0710    ScGareth MorganMD 09/28/20 [phone]

## 2020-09-03 NOTE — Plan of Care (Signed)

## 2020-09-03 NOTE — Progress Notes (Signed)
[place] for Lovenox Indication: atrial fibrillation  Labs: Recent Labs    09/03/20 1040  HGB 14.6  HCT 44.1  PLT 323  CREATININE 1.46*    Assessment: 23 yom presenting with exertional SOB x few months that increased over the last week. Pharmacy consulted to dose Lovenox. Per discussion with Dr. Billy Fischer, not currently suspecting PE or anticipating any procedures. Patient is not on anticoagulation PTA. CBC wnl, SCr 1.46 on admit. No active bleed issues reported.  Per discussion with patient and family member, last weight was 203 lb.  Goal of Therapy:  Anti-Xa level 0.6-1 units/ml 4hrs after LMWH dose given Monitor platelets by anticoagulation protocol: Yes   Plan:  Lovenox 87m (183mkg) [place] q12h Monitor CBC, SCr, s/sx bleeding  F/u long-term anticoagulation plan   HaArturo MortonPharmD, BCPS Clinical Pharmacist 09/03/2020 2:16 PM

## 2020-09-04 ENCOUNTER — Encounter (HOSPITAL_COMMUNITY): Payer: Self-pay | Admitting: Internal Medicine

## 2020-09-04 ENCOUNTER — Encounter (HOSPITAL_COMMUNITY)
Admission: EM | Disposition: A | Discharge status: Rehabilitation Facility | Payer: Self-pay | Source: Home / Self Care | Attending: Family Medicine

## 2020-09-04 ENCOUNTER — Other Ambulatory Visit (HOSPITAL_COMMUNITY): Payer: Self-pay

## 2020-09-04 DIAGNOSIS — I10 Essential (primary) hypertension: Secondary | ICD-10-CM | POA: Diagnosis not present

## 2020-09-04 DIAGNOSIS — I509 Heart failure, unspecified: Secondary | ICD-10-CM | POA: Diagnosis not present

## 2020-09-04 DIAGNOSIS — E1165 Type 2 diabetes mellitus with hyperglycemia: Secondary | ICD-10-CM | POA: Diagnosis present

## 2020-09-04 DIAGNOSIS — I5021 Acute systolic (congestive) heart failure: Secondary | ICD-10-CM | POA: Diagnosis not present

## 2020-09-04 DIAGNOSIS — I484 Atypical atrial flutter: Secondary | ICD-10-CM | POA: Diagnosis present

## 2020-09-04 DIAGNOSIS — I4510 Unspecified right bundle-branch block: Secondary | ICD-10-CM

## 2020-09-04 DIAGNOSIS — I50810 Right heart failure, unspecified: Secondary | ICD-10-CM | POA: Diagnosis not present

## 2020-09-04 DIAGNOSIS — B9681 Helicobacter pylori [H. pylori] as the cause of diseases classified elsewhere: Secondary | ICD-10-CM | POA: Diagnosis not present

## 2020-09-04 DIAGNOSIS — N1831 Chronic kidney disease, stage 3a: Secondary | ICD-10-CM | POA: Diagnosis not present

## 2020-09-04 DIAGNOSIS — K922 Gastrointestinal hemorrhage, unspecified: Secondary | ICD-10-CM | POA: Diagnosis not present

## 2020-09-04 DIAGNOSIS — J9690 Respiratory failure, unspecified, unspecified whether with hypoxia or hypercapnia: Secondary | ICD-10-CM | POA: Diagnosis not present

## 2020-09-04 DIAGNOSIS — R578 Other shock: Secondary | ICD-10-CM | POA: Diagnosis not present

## 2020-09-04 DIAGNOSIS — I2510 Atherosclerotic heart disease of native coronary artery without angina pectoris: Secondary | ICD-10-CM | POA: Diagnosis present

## 2020-09-04 DIAGNOSIS — I50813 Acute on chronic right heart failure: Secondary | ICD-10-CM | POA: Diagnosis not present

## 2020-09-04 DIAGNOSIS — I480 Paroxysmal atrial fibrillation: Secondary | ICD-10-CM | POA: Diagnosis present

## 2020-09-04 DIAGNOSIS — Z7984 Long term (current) use of oral hypoglycemic drugs: Secondary | ICD-10-CM | POA: Diagnosis not present

## 2020-09-04 DIAGNOSIS — R918 Other nonspecific abnormal finding of lung field: Secondary | ICD-10-CM | POA: Diagnosis present

## 2020-09-04 DIAGNOSIS — E8809 Other disorders of plasma-protein metabolism, not elsewhere classified: Secondary | ICD-10-CM | POA: Diagnosis present

## 2020-09-04 DIAGNOSIS — E1122 Type 2 diabetes mellitus with diabetic chronic kidney disease: Secondary | ICD-10-CM | POA: Diagnosis present

## 2020-09-04 DIAGNOSIS — K4020 Bilateral inguinal hernia, without obstruction or gangrene, not specified as recurrent: Secondary | ICD-10-CM | POA: Diagnosis not present

## 2020-09-04 DIAGNOSIS — I700 Atherosclerosis of aorta: Secondary | ICD-10-CM | POA: Diagnosis not present

## 2020-09-04 DIAGNOSIS — Z7982 Long term (current) use of aspirin: Secondary | ICD-10-CM | POA: Diagnosis not present

## 2020-09-04 DIAGNOSIS — R5381 Other malaise: Secondary | ICD-10-CM | POA: Diagnosis present

## 2020-09-04 DIAGNOSIS — K264 Chronic or unspecified duodenal ulcer with hemorrhage: Secondary | ICD-10-CM | POA: Diagnosis not present

## 2020-09-04 DIAGNOSIS — E871 Hypo-osmolality and hyponatremia: Secondary | ICD-10-CM | POA: Diagnosis not present

## 2020-09-04 DIAGNOSIS — I130 Hypertensive heart and chronic kidney disease with heart failure and stage 1 through stage 4 chronic kidney disease, or unspecified chronic kidney disease: Secondary | ICD-10-CM | POA: Diagnosis not present

## 2020-09-04 DIAGNOSIS — I482 Chronic atrial fibrillation, unspecified: Secondary | ICD-10-CM | POA: Diagnosis not present

## 2020-09-04 DIAGNOSIS — E669 Obesity, unspecified: Secondary | ICD-10-CM | POA: Diagnosis present

## 2020-09-04 DIAGNOSIS — J9601 Acute respiratory failure with hypoxia: Secondary | ICD-10-CM | POA: Diagnosis not present

## 2020-09-04 DIAGNOSIS — Z87891 Personal history of nicotine dependence: Secondary | ICD-10-CM | POA: Diagnosis not present

## 2020-09-04 DIAGNOSIS — I2781 Cor pulmonale (chronic): Secondary | ICD-10-CM | POA: Diagnosis not present

## 2020-09-04 DIAGNOSIS — I272 Pulmonary hypertension, unspecified: Secondary | ICD-10-CM | POA: Diagnosis not present

## 2020-09-04 DIAGNOSIS — I4891 Unspecified atrial fibrillation: Secondary | ICD-10-CM | POA: Diagnosis not present

## 2020-09-04 DIAGNOSIS — J154 Pneumonia due to other streptococci: Secondary | ICD-10-CM | POA: Diagnosis not present

## 2020-09-04 DIAGNOSIS — K260 Acute duodenal ulcer with hemorrhage: Secondary | ICD-10-CM | POA: Diagnosis not present

## 2020-09-04 DIAGNOSIS — I2721 Secondary pulmonary arterial hypertension: Secondary | ICD-10-CM | POA: Diagnosis not present

## 2020-09-04 DIAGNOSIS — R0902 Hypoxemia: Secondary | ICD-10-CM | POA: Diagnosis not present

## 2020-09-04 DIAGNOSIS — K2950 Unspecified chronic gastritis without bleeding: Secondary | ICD-10-CM | POA: Diagnosis not present

## 2020-09-04 DIAGNOSIS — I517 Cardiomegaly: Secondary | ICD-10-CM | POA: Diagnosis not present

## 2020-09-04 DIAGNOSIS — I5023 Acute on chronic systolic (congestive) heart failure: Secondary | ICD-10-CM | POA: Diagnosis not present

## 2020-09-04 DIAGNOSIS — Z820 Family history of epilepsy and other diseases of the nervous system: Secondary | ICD-10-CM | POA: Diagnosis not present

## 2020-09-04 DIAGNOSIS — J90 Pleural effusion, not elsewhere classified: Secondary | ICD-10-CM | POA: Diagnosis not present

## 2020-09-04 DIAGNOSIS — K269 Duodenal ulcer, unspecified as acute or chronic, without hemorrhage or perforation: Secondary | ICD-10-CM | POA: Diagnosis not present

## 2020-09-04 DIAGNOSIS — J9611 Chronic respiratory failure with hypoxia: Secondary | ICD-10-CM | POA: Diagnosis not present

## 2020-09-04 DIAGNOSIS — J811 Chronic pulmonary edema: Secondary | ICD-10-CM | POA: Diagnosis not present

## 2020-09-04 DIAGNOSIS — Z79899 Other long term (current) drug therapy: Secondary | ICD-10-CM | POA: Diagnosis not present

## 2020-09-04 DIAGNOSIS — J449 Chronic obstructive pulmonary disease, unspecified: Secondary | ICD-10-CM | POA: Diagnosis not present

## 2020-09-04 DIAGNOSIS — J810 Acute pulmonary edema: Secondary | ICD-10-CM | POA: Diagnosis not present

## 2020-09-04 DIAGNOSIS — Z823 Family history of stroke: Secondary | ICD-10-CM | POA: Diagnosis not present

## 2020-09-04 DIAGNOSIS — Z9981 Dependence on supplemental oxygen: Secondary | ICD-10-CM | POA: Diagnosis not present

## 2020-09-04 DIAGNOSIS — D72828 Other elevated white blood cell count: Secondary | ICD-10-CM | POA: Diagnosis not present

## 2020-09-04 DIAGNOSIS — K921 Melena: Secondary | ICD-10-CM | POA: Diagnosis not present

## 2020-09-04 DIAGNOSIS — R079 Chest pain, unspecified: Secondary | ICD-10-CM | POA: Diagnosis not present

## 2020-09-04 DIAGNOSIS — D62 Acute posthemorrhagic anemia: Secondary | ICD-10-CM | POA: Diagnosis not present

## 2020-09-04 DIAGNOSIS — Z833 Family history of diabetes mellitus: Secondary | ICD-10-CM | POA: Diagnosis not present

## 2020-09-04 DIAGNOSIS — R0602 Shortness of breath: Secondary | ICD-10-CM | POA: Diagnosis not present

## 2020-09-04 DIAGNOSIS — I248 Other forms of acute ischemic heart disease: Secondary | ICD-10-CM | POA: Diagnosis present

## 2020-09-04 DIAGNOSIS — K2970 Gastritis, unspecified, without bleeding: Secondary | ICD-10-CM | POA: Diagnosis not present

## 2020-09-04 DIAGNOSIS — E46 Unspecified protein-calorie malnutrition: Secondary | ICD-10-CM | POA: Diagnosis not present

## 2020-09-04 DIAGNOSIS — J984 Other disorders of lung: Secondary | ICD-10-CM | POA: Diagnosis not present

## 2020-09-04 DIAGNOSIS — J948 Other specified pleural conditions: Secondary | ICD-10-CM | POA: Diagnosis not present

## 2020-09-04 DIAGNOSIS — F32A Depression, unspecified: Secondary | ICD-10-CM | POA: Diagnosis present

## 2020-09-04 DIAGNOSIS — D649 Anemia, unspecified: Secondary | ICD-10-CM | POA: Diagnosis not present

## 2020-09-04 DIAGNOSIS — I2609 Other pulmonary embolism with acute cor pulmonale: Secondary | ICD-10-CM | POA: Diagnosis not present

## 2020-09-04 DIAGNOSIS — N179 Acute kidney failure, unspecified: Secondary | ICD-10-CM | POA: Diagnosis not present

## 2020-09-04 DIAGNOSIS — J440 Chronic obstructive pulmonary disease with acute lower respiratory infection: Secondary | ICD-10-CM | POA: Diagnosis not present

## 2020-09-04 DIAGNOSIS — E785 Hyperlipidemia, unspecified: Secondary | ICD-10-CM | POA: Diagnosis present

## 2020-09-04 DIAGNOSIS — I2729 Other secondary pulmonary hypertension: Secondary | ICD-10-CM | POA: Diagnosis not present

## 2020-09-04 DIAGNOSIS — Z20822 Contact with and (suspected) exposure to covid-19: Secondary | ICD-10-CM | POA: Diagnosis not present

## 2020-09-04 DIAGNOSIS — K8689 Other specified diseases of pancreas: Secondary | ICD-10-CM | POA: Diagnosis not present

## 2020-09-04 DIAGNOSIS — G4733 Obstructive sleep apnea (adult) (pediatric): Secondary | ICD-10-CM | POA: Diagnosis present

## 2020-09-04 DIAGNOSIS — Z452 Encounter for adjustment and management of vascular access device: Secondary | ICD-10-CM | POA: Diagnosis not present

## 2020-09-04 DIAGNOSIS — I959 Hypotension, unspecified: Secondary | ICD-10-CM | POA: Diagnosis not present

## 2020-09-04 DIAGNOSIS — I4819 Other persistent atrial fibrillation: Secondary | ICD-10-CM | POA: Diagnosis not present

## 2020-09-04 DIAGNOSIS — K439 Ventral hernia without obstruction or gangrene: Secondary | ICD-10-CM | POA: Diagnosis not present

## 2020-09-04 DIAGNOSIS — J80 Acute respiratory distress syndrome: Secondary | ICD-10-CM | POA: Diagnosis not present

## 2020-09-04 DIAGNOSIS — E1169 Type 2 diabetes mellitus with other specified complication: Secondary | ICD-10-CM | POA: Diagnosis present

## 2020-09-04 HISTORY — PX: RIGHT/LEFT HEART CATH AND CORONARY ANGIOGRAPHY: CATH118266

## 2020-09-04 LAB — POCT I-STAT EG7
Acid-Base Excess: 0 mmol/L (ref 0.0–2.0)
Acid-base deficit: 1 mmol/L (ref 0.0–2.0)
Acid-base deficit: 1 mmol/L (ref 0.0–2.0)
Bicarbonate: 25.1 mmol/L (ref 20.0–28.0)
Bicarbonate: 25.1 mmol/L (ref 20.0–28.0)
Bicarbonate: 26.2 mmol/L (ref 20.0–28.0)
Calcium, Ion: 0.96 mmol/L — ABNORMAL LOW (ref 1.15–1.40)
Calcium, Ion: 1.01 mmol/L — ABNORMAL LOW (ref 1.15–1.40)
Calcium, Ion: 1.11 mmol/L — ABNORMAL LOW (ref 1.15–1.40)
HCT: 38 % — ABNORMAL LOW (ref 39.0–52.0)
HCT: 38 % — ABNORMAL LOW (ref 39.0–52.0)
HCT: 41 % (ref 39.0–52.0)
Hemoglobin: 12.9 g/dL — ABNORMAL LOW (ref 13.0–17.0)
Hemoglobin: 12.9 g/dL — ABNORMAL LOW (ref 13.0–17.0)
Hemoglobin: 13.9 g/dL (ref 13.0–17.0)
O2 Saturation: 63 %
O2 Saturation: 65 %
O2 Saturation: 73 %
Potassium: 3.7 mmol/L (ref 3.5–5.1)
Potassium: 3.9 mmol/L (ref 3.5–5.1)
Potassium: 4.1 mmol/L (ref 3.5–5.1)
Sodium: 137 mmol/L (ref 135–145)
Sodium: 137 mmol/L (ref 135–145)
Sodium: 139 mmol/L (ref 135–145)
TCO2: 26 mmol/L (ref 22–32)
TCO2: 26 mmol/L (ref 22–32)
TCO2: 28 mmol/L (ref 22–32)
pCO2, Ven: 45.9 mmHg (ref 44.0–60.0)
pCO2, Ven: 46.7 mmHg (ref 44.0–60.0)
pCO2, Ven: 48.1 mmHg (ref 44.0–60.0)
pH, Ven: 7.338 (ref 7.250–7.430)
pH, Ven: 7.344 (ref 7.250–7.430)
pH, Ven: 7.345 (ref 7.250–7.430)
pO2, Ven: 35 mmHg (ref 32.0–45.0)
pO2, Ven: 36 mmHg (ref 32.0–45.0)
pO2, Ven: 41 mmHg (ref 32.0–45.0)

## 2020-09-04 LAB — BASIC METABOLIC PANEL
Anion gap: 11 (ref 5–15)
BUN: 49 mg/dL — ABNORMAL HIGH (ref 8–23)
CO2: 24 mmol/L (ref 22–32)
Calcium: 8.5 mg/dL — ABNORMAL LOW (ref 8.9–10.3)
Chloride: 99 mmol/L (ref 98–111)
Creatinine, Ser: 1.63 mg/dL — ABNORMAL HIGH (ref 0.61–1.24)
GFR, Estimated: 45 mL/min — ABNORMAL LOW (ref >60)
Glucose, Bld: 75 mg/dL (ref 70–99)
Potassium: 4 mmol/L (ref 3.5–5.1)
Sodium: 134 mmol/L — ABNORMAL LOW (ref 135–145)

## 2020-09-04 LAB — POCT I-STAT 7, (LYTES, BLD GAS, ICA,H+H)
Acid-base deficit: 1 mmol/L (ref 0.0–2.0)
Bicarbonate: 24.1 mmol/L (ref 20.0–28.0)
Calcium, Ion: 1.01 mmol/L — ABNORMAL LOW (ref 1.15–1.40)
HCT: 39 % (ref 39.0–52.0)
Hemoglobin: 13.3 g/dL (ref 13.0–17.0)
O2 Saturation: 100 %
Potassium: 3.9 mmol/L (ref 3.5–5.1)
Sodium: 137 mmol/L (ref 135–145)
TCO2: 25 mmol/L (ref 22–32)
pCO2 arterial: 40.8 mmHg (ref 32.0–48.0)
pH, Arterial: 7.38 (ref 7.350–7.450)
pO2, Arterial: 170 mmHg — ABNORMAL HIGH (ref 83.0–108.0)

## 2020-09-04 LAB — LIPID PANEL
Cholesterol: 64 mg/dL (ref 0–200)
HDL: 30 mg/dL — ABNORMAL LOW (ref >40)
LDL Cholesterol: 20 mg/dL (ref 0–99)
Total CHOL/HDL Ratio: 2.1 RATIO
Triglycerides: 68 mg/dL (ref <150)
VLDL: 14 mg/dL (ref 0–40)

## 2020-09-04 LAB — CBC
HCT: 40.8 % (ref 39.0–52.0)
Hemoglobin: 13.4 g/dL (ref 13.0–17.0)
MCH: 30.8 pg (ref 26.0–34.0)
MCHC: 32.8 g/dL (ref 30.0–36.0)
MCV: 93.8 fL (ref 80.0–100.0)
Platelets: 312 10*3/uL (ref 150–400)
RBC: 4.35 MIL/uL (ref 4.22–5.81)
RDW: 14.3 % (ref 11.5–15.5)
WBC: 7.9 10*3/uL (ref 4.0–10.5)
nRBC: 0 % (ref 0.0–0.2)

## 2020-09-04 LAB — GLUCOSE, CAPILLARY
Glucose-Capillary: 70 mg/dL (ref 70–99)
Glucose-Capillary: 136 mg/dL — ABNORMAL HIGH (ref 70–99)
Glucose-Capillary: 157 mg/dL — ABNORMAL HIGH (ref 70–99)
Glucose-Capillary: 156 mg/dL — ABNORMAL HIGH (ref 70–99)

## 2020-09-04 LAB — MAGNESIUM: Magnesium: 1.8 mg/dL (ref 1.7–2.4)

## 2020-09-04 SURGERY — RIGHT/LEFT HEART CATH AND CORONARY ANGIOGRAPHY
Anesthesia: LOCAL

## 2020-09-04 MED ORDER — IOHEXOL 350 MG/ML SOLN
INTRAVENOUS | Status: DC | PRN
  Administered 2020-09-04: 50 mL via INTRA_ARTERIAL

## 2020-09-04 MED ORDER — HEPARIN (PORCINE) IN NACL 1000-0.9 UT/500ML-% IV SOLN
INTRAVENOUS | Status: AC
  Filled 2020-09-04: qty 1500

## 2020-09-04 MED ORDER — SODIUM CHLORIDE 0.9% FLUSH: 3.0000 mL | INTRAVENOUS | Status: DC | PRN

## 2020-09-04 MED ORDER — ACETAMINOPHEN 325 MG PO TABS
650.0000 mg | ORAL_TABLET | ORAL | Status: DC | PRN
  Administered 2020-09-06 – 2020-09-24 (×10): 650 mg via ORAL
  Filled 2020-09-04 (×10): qty 2

## 2020-09-04 MED ORDER — HEPARIN (PORCINE) IN NACL 1000-0.9 UT/500ML-% IV SOLN
INTRAVENOUS | Status: DC | PRN
  Administered 2020-09-04 (×2): 500 mL

## 2020-09-04 MED ORDER — SODIUM CHLORIDE 0.9% FLUSH
3.0000 mL | Freq: Two times a day (BID) | INTRAVENOUS | Status: DC
  Administered 2020-09-06: 3 mL via INTRAVENOUS

## 2020-09-04 MED ORDER — HEPARIN SODIUM (PORCINE) 1000 UNIT/ML IJ SOLN
INTRAMUSCULAR | Status: AC
  Filled 2020-09-04: qty 1

## 2020-09-04 MED ORDER — SODIUM CHLORIDE 0.9 % IV SOLN
250.0000 mL | INTRAVENOUS | Status: DC | PRN
  Administered 2020-09-09: 250 mL via INTRAVENOUS

## 2020-09-04 MED ORDER — SODIUM CHLORIDE 0.9 % IV SOLN: INTRAVENOUS | Status: AC

## 2020-09-04 MED ORDER — SODIUM CHLORIDE 0.9 % IV SOLN: 250.0000 mL | INTRAVENOUS | Status: DC | PRN

## 2020-09-04 MED ORDER — HYDRALAZINE HCL 20 MG/ML IJ SOLN: 10.0000 mg | INTRAMUSCULAR | Status: AC | PRN

## 2020-09-04 MED ORDER — SODIUM CHLORIDE 0.9 % IV SOLN
INTRAVENOUS | Status: DC | PRN
  Administered 2020-09-04: 10 mL/h via INTRAVENOUS

## 2020-09-04 MED ORDER — HEPARIN SODIUM (PORCINE) 1000 UNIT/ML IJ SOLN
INTRAMUSCULAR | Status: DC | PRN
  Administered 2020-09-04: 4000 [IU] via INTRAVENOUS

## 2020-09-04 MED ORDER — LIDOCAINE HCL (PF) 1 % IJ SOLN
INTRAMUSCULAR | Status: DC | PRN
  Administered 2020-09-04: 5 mL

## 2020-09-04 MED ORDER — ATORVASTATIN CALCIUM 40 MG PO TABS
40.0000 mg | ORAL_TABLET | Freq: Every day | ORAL | Status: DC
  Administered 2020-09-05 – 2020-09-07 (×3): 40 mg via ORAL
  Filled 2020-09-04 (×3): qty 1

## 2020-09-04 MED ORDER — SODIUM CHLORIDE 0.9% FLUSH
3.0000 mL | Freq: Two times a day (BID) | INTRAVENOUS | Status: DC
  Administered 2020-09-04 – 2020-09-06 (×4): 3 mL via INTRAVENOUS

## 2020-09-04 MED ORDER — VERAPAMIL HCL 2.5 MG/ML IV SOLN
INTRAVENOUS | Status: AC
  Filled 2020-09-04: qty 2

## 2020-09-04 MED ORDER — SODIUM CHLORIDE 0.9 % IV SOLN: INTRAVENOUS | Status: DC

## 2020-09-04 MED ORDER — LIDOCAINE HCL (PF) 1 % IJ SOLN
INTRAMUSCULAR | Status: AC
  Filled 2020-09-04: qty 30

## 2020-09-04 MED ORDER — LABETALOL HCL 5 MG/ML IV SOLN: 10.0000 mg | INTRAVENOUS | Status: AC | PRN

## 2020-09-04 MED ORDER — ONDANSETRON HCL 4 MG/2ML IJ SOLN
4.0000 mg | Freq: Four times a day (QID) | INTRAMUSCULAR | Status: DC | PRN
  Administered 2020-09-11 – 2020-09-12 (×2): 4 mg via INTRAVENOUS
  Filled 2020-09-04 (×2): qty 2

## 2020-09-04 MED ORDER — VERAPAMIL HCL 2.5 MG/ML IV SOLN
INTRAVENOUS | Status: DC | PRN
  Administered 2020-09-04: 10 mL via INTRA_ARTERIAL

## 2020-09-04 MED ORDER — ASPIRIN 81 MG PO CHEW: 81.0000 mg | CHEWABLE_TABLET | ORAL | Status: DC

## 2020-09-04 SURGICAL SUPPLY — 11 items
CATH 5FR JL3.5 JR4 ANG PIG MP (CATHETERS) ×1 IMPLANT
CATH SWAN GANZ 7F STRAIGHT (CATHETERS) ×1 IMPLANT
DEVICE RAD COMP TR BAND LRG (VASCULAR PRODUCTS) ×1 IMPLANT
GLIDESHEATH SLEND SS 6F .021 (SHEATH) ×1 IMPLANT
GLIDESHEATH SLENDER 7FR .021G (SHEATH) ×1 IMPLANT
GUIDEWIRE .025 260CM (WIRE) ×1 IMPLANT
GUIDEWIRE INQWIRE 1.5J.035X260 (WIRE) IMPLANT
INQWIRE 1.5J .035X260CM (WIRE) ×2
PACK CARDIAC CATHETERIZATION (CUSTOM PROCEDURE TRAY) ×2 IMPLANT
TRANSDUCER W/STOPCOCK (MISCELLANEOUS) ×2 IMPLANT
WIRE HI TORQ VERSACORE-J 145CM (WIRE) ×1 IMPLANT

## 2020-09-04 NOTE — Consult Note (Addendum)
NAME:  Gary [name], MRN:  [id number], DOB:  03-20-1951, LOS: 0 ADMISSION DATE:  09/03/2020, CONSULTATION DATE:  09/04/20 REFERRING MD:  Dr. Gwendlyn Deutscher, CHIEF COMPLAINT:  SOB   History of Present Illness:  70 y/o M who presented to [facility] on 4/18 with complaints of shortness of breath.    The patient lives at home with his wife.  He is independent of all ADL's.  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  He is a former smoker - smoked from 1968-2005 off / on, heaviest 1.5 ppd.  He grew up in [place], [place] with coal burning stove.  He has multiple hobbies to include Civil War reenactment with black powder rifles, Actuary, & woodworking.  He denies known history of connective tissue disease within family.  His daughter had Turner's syndrome (deceased at age 70 in 63).  The patient reports he had a sleep study in 2016 which did not warrant intervention.  He also reports a significant nosebleed in 2015 that required balloon packing and additional procedure that sounded like embolization by description for bleeding (per ENT in [place]).  No hx of obstructive sleep apnea (had a negative sleep study in the past), no hx of blood clots.   He reports 3 months prior to presentation he had no shortness of breath with activity.  Patient noted in February 2022 that he began having shortness of breath with exertion and activities that normally were not difficult for him (walking uphill to go to church, taking the dogs out).  He also noted feeling full early in meals.  His wife reports that in the last 2 to 3 weeks he has had significant shortness of breath, decreased p.o. intake.  She noted that on 4/16 and 4/17 he was so short of breath to the point that he did not talk much over the weekend prompting evaluation on 4/18.  He reported difficulty walking into the emergency room.  His wife reported that he had poor  coloring with a grayish appearance.    The patient was admitted per teaching service for further evaluation.  Initially required 4 L of oxygen to maintain oxygen saturations which was quickly escalated to 15 L salter.  Cardiology was consulted with new finding of atrial fibrillation.  Echo evaluation demonstrated severe RV enlargement with severely depressed RV systolic function and moderately elevated PASP, LVEF 55-60%, difficult to assess wall motion abnormalities but appeared grossly normal, mild concentric left ventricular hypertrophy, RV systolic function severely reduced, RV severely enlarged, moderately elevated pulmonary artery pressure with estimated RV systolic pressure of 43.1, RA moderately dilated.  Given echo findings, a CTA of the chest was evaluated and negative for pulmonary embolism but demonstrated enlarged central pulmonary arteries consistent with pulmonary arterial hypertension, enlargement of the right ventricle and right atrium, small bilateral pleural effusions greater on the right and patchy groundglass opacities bilaterally, unresolved right middle lobe opacity.  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  He continued to require 15 L salter oxygen to maintain saturations above 90.  PCCM consulted for evaluation of hypoxemic respiratory failure  and catheterization findings.    Pertinent  Medical History  Former smoker HTN DM Traumatic crush injury from a tree branch with multiple fractures Epistaxis requiring packing   Significant Hospital Events: Including procedures, antibiotic start and stop dates in addition to other pertinent events   . 4/18  admitted with SOB, echo with elevated right heart pressures, new finding of A. fib . 4/19 R/L heart cath with concern for pulmonary hypertension  Interim History / Subjective:  Patient reports feeling much better since admission Wife at bedside  Afebrile  On 15L [place]ter O2  Objective   Blood pressure 110/77, pulse 90, temperature 98.4 F (36.9 C), temperature source Axillary, resp. rate (!) 24, height 5' 7"  (1.702 m), weight 86.3 kg, SpO2 91 %.        Intake/Output Summary (Last 24 hours) at 09/04/2020 1419 Last data filed at 09/04/2020 0847 Gross per 24 hour  Intake 440 ml  Output 2370 ml  Net -1930 ml   Filed Weights   09/04/20 0400  Weight: 86.3 kg    Examination: General: adult male sitting up on side of bed in NAD, wife at bedside   HEENT: MM pink/moist, [place] O2, anicteric, no nasal deformity noted  Neuro: AAOx4, speech clear, MAE CV: s1s2 irr irr, AF on monitor, no m/r/g PULM: non-labored at rest, lungs bilaterally clear, mildly diminished bases GI: soft, bsx4 active  Extremities: warm/dry, no edema  Skin: no rashes or lesions   Resolved Hospital Problem list      Assessment & Plan:   Acute Hypoxemic Respiratory Failure  Pulmonary Arterial Hypertension  Cor Pulmonale  R>L Pleural Effusions Former Tobacco Abuse  Suspected Occupational Exposures   Discussion: Former smoker, retired Agricultural consultant admitted with acute hypoxemic respiratory failure.  New AF noted on admit as well as findings of enlarged pulmonary artery on CTA with background mild ground glass, negative for PE, persistent opacities on R and R>L pleural effusion.  On my review, no significant background emphysema.  ECHO showed severe RV enlargement with moderate pulmonary HTN. Follow up R&LHC completed with concern for pulmonary hypertension, CAD.  PAH/PH group 1,3. However, will rule out any autoimmune contribution.    -assess HIV, ANA, RF, CCP, ESR -wean O2 for saturations 90% -follow up intermittent CXR   -trial of nocturnal CPAP QHS while inpatient  -suspect he will need outpatient sleep study, follow up PET-CT for R airspace disease  -diuresis as renal function / BP permit, caution with contrast for CTA chest and R/LHC -if effusions persist, consider sampling  -will need outpatient pulmonary follow up  -pulmonary hygiene - IS, mobilize  -consider bubble study ?  Atrial Fibrillation  RBBB CAD  -per Cardiology    MD to follow.   Best practice (right click and "Reselect all SmartList Selections" daily)  Per Primary   Labs   CBC: Recent Labs  Lab 09/03/20 1040 09/04/20 0414 09/04/20 1054 09/04/20 1100 09/04/20 1118  WBC 8.1 7.9  --   --   --   HGB 14.6 13.4 13.3 13.9  12.9* 12.9*  HCT 44.1 40.8 39.0 41.0  38.0* 38.0*  MCV 94.0 93.8  --   --   --   PLT 323 312  --   --   --     Basic Metabolic Panel: Recent Labs  Lab 09/03/20 1040 09/03/20 1803 09/04/20 0414 09/04/20 1054 09/04/20 1100 09/04/20 1118  NA 134* 137 134* 137 137  139 137  K 4.5 4.6 4.0 3.9 4.1  3.7  3.9  CL 99 101 99  --   --   --   CO2 21* 25 24  --   --   --   GLUCOSE 116* 82 75  --   --   --   BUN 41* 45* 49*  --   --   --   CREATININE 1.46* 1.57* 1.63*  --   --   --   CALCIUM 8.8* 8.7* 8.5*  --   --   --   MG  --   --  1.8  --   --   --    GFR: Estimated Creatinine Clearance: 43.6 mL/min (A) (by C-G formula based on SCr of 1.63 mg/dL (H)). Recent Labs  Lab 09/03/20 1040 09/04/20 0414  WBC 8.1 7.9    Liver Function Tests: No results for input(s): AST, ALT, ALKPHOS, BILITOT, PROT, ALBUMIN in the last 168 hours. No results for input(s): LIPASE, AMYLASE in the last 168 hours. No results for input(s): AMMONIA in the last 168 hours.  ABG    Component Value Date/Time   PHART 7.380 09/04/2020 1054   PCO2ART 40.8 09/04/2020 1054   PO2ART 170 (H) 09/04/2020 1054   HCO3 25.1 09/04/2020 1118   TCO2 26 09/04/2020 1118   ACIDBASEDEF 1.0 09/04/2020 1118   O2SAT 73.0 09/04/2020 1118      Coagulation Profile: No results for input(s): INR, PROTIME in the last 168 hours.  Cardiac Enzymes: No results for input(s): CKTOTAL, CKMB, CKMBINDEX, TROPONINI in the last 168 hours.  HbA1C: Hgb A1c MFr Bld  Date/Time Value Ref Range Status  09/03/2020 02:52 PM 6.7 (H) 4.8 - 5.6 % Final    Comment:    (NOTE) Pre diabetes:          5.7%-6.4%  Diabetes:              >6.4%  Glycemic control for   <7.0% adults with diabetes     CBG: Recent Labs  Lab 09/03/20 1713 09/03/20 2103 09/04/20 0553 09/04/20 1251  GLUCAP 86 114* 70 136*    Review of Systems: Positives in [place]   Gen: Denies fever, chills, weight change, fatigue, night sweats HEENT: Denies blurred vision, double vision, hearing loss, tinnitus, sinus congestion, rhinorrhea, sore throat, neck stiffness, dysphagia PULM: Denies shortness of breath, cough, sputum production, hemoptysis, wheezing CV: Denies chest pain, edema, orthopnea, paroxysmal nocturnal dyspnea, palpitations GI: Denies abdominal pain, nausea, vomiting, diarrhea, hematochezia, melena, constipation, change in bowel habits GU: Denies dysuria, hematuria, polyuria, oliguria, urethral discharge Endocrine: Denies hot or cold intolerance, polyuria, polyphagia or appetite change Derm: Denies rash, dry skin, scaling or peeling skin change Heme: Denies easy bruising, bleeding, bleeding gums Neuro: Denies headache, numbness, weakness, slurred speech, loss of memory or consciousness  Past Medical History:  He,  has a past medical history of Diabetes mellitus without complication ([place]), Epistaxis (2014), Hyperlipidemia due to type 2 diabetes mellitus ([place]), and Hypertension.   Surgical History:   Past Surgical History:  Procedure Laterality Date  . NOSE SURGERY  2014  . PELVIC FRACTURE SURGERY  2013  . RIGHT/LEFT HEART CATH AND CORONARY ANGIOGRAPHY N/A 09/04/2020   Procedure: RIGHT/LEFT HEART CATH AND CORONARY ANGIOGRAPHY;  Surgeon: Jolaine Artist, MD;   Location: [facility];  Service: Cardiovascular;  Laterality: N/A;     Social History:   reports that he has never smoked. He has never used smokeless tobacco. He reports that he does not drink alcohol and does not use drugs.  Family History:  His family history includes Alzheimer's disease in his mother; Diabetes in his brother and mother; Heart Problems in his father; Stroke in his father.   Allergies Allergies  Allergen Reactions  . Novocain [Procaine] Other (See Comments)    Unknown reaction as a young child     Home Medications  Prior to Admission medications   Medication Sig Start Date End Date Taking? Authorizing Provider  acetaminophen (TYLENOL) 325 MG tablet Take 650 mg by mouth every 6 (six) hours as needed for headache (pain).   Yes [provider]  amLODipine (NORVASC) 5 MG tablet Take 5 mg by mouth every morning.   Yes [provider]  aspirin EC 81 MG tablet Take 81 mg by mouth every morning. Swallow whole.   Yes [provider]  atorvastatin (LIPITOR) 40 MG tablet Take 40 mg by mouth every evening.   Yes [provider]  buPROPion (WELLBUTRIN SR) 150 MG 12 hr tablet Take 150 mg by mouth 2 (two) times daily. 08/01/20  Yes [provider]  enalapril-hydrochlorothiazide (VASERETIC) 10-25 MG per tablet Take 1 tablet by mouth at bedtime.   Yes [provider]  glipiZIDE (GLUCOTROL XL) 10 MG 24 hr tablet Take 10 mg by mouth every morning.   Yes [provider]  metFORMIN (GLUCOPHAGE) 850 MG tablet Take 850-1,700 mg by mouth See admin instructions. Take 2 tablets (1700 mg) by mouth every morning and 1 tablet (850 mg) at night 08/01/20  Yes [provider]  Multiple Vitamin (MULTIVITAMIN WITH MINERALS) TABS tablet Take 1 tablet by mouth daily.   Yes [provider]  pioglitazone (ACTOS) 15 MG tablet Take 15 mg by mouth 2 (two) times daily.   Yes [provider]  buPROPion (WELLBUTRIN  XL) 150 MG 24 hr tablet Take 150 mg by mouth 2 (two) times daily. Patient not taking: No sig reported    [provider]     Critical care time: n/a      Noe Gens, MSN, APRN, NP-C, AGACNP-BC [facility] 09/04/2020, 2:19 PM   Please see [email or web address] for pager details.   From 7A-7P if no response, please call [phone] After hours, please call ELink [phone]

## 2020-09-04 NOTE — Progress Notes (Signed)
ANTICOAGULATION CONSULT NOTE - Follow/Up Consult  Pharmacy Consult for Lovenox Indication: atrial fibrillation  Allergies  Allergen Reactions  . Novocain [Procaine] Other (See Comments)    Unknown reaction as a young child    Patient Measurements: Height: 5' 7"  (170.2 cm) Weight: 86.3 kg (190 lb 4.8 oz) (scale c) IBW/kg (Calculated) : 66.1   Vital Signs: Temp: 98.4 F (36.9 C) (04/19 0400) Temp Source: Axillary (04/19 0400) BP: 103/78 (04/19 1156) Pulse Rate: 95 (04/19 1156)  Labs: Recent Labs    09/03/20 1040 09/03/20 1433 09/03/20 1803 09/04/20 0414  HGB 14.6  --   --  13.4  HCT 44.1  --   --  40.8  PLT 323  --   --  312  CREATININE 1.46*  --  1.57* 1.63*  TROPONINIHS 98* 101* 98*  --     Estimated Creatinine Clearance: 43.6 mL/min (A) (by C-G formula based on SCr of 1.63 mg/dL (H)).   Medical History: Past Medical History:  Diagnosis Date  . Diabetes mellitus without complication ([place])   . Epistaxis 2014   Secondary to a nasal abnormality, treated and resolved  . Hyperlipidemia due to type 2 diabetes mellitus ([place])   . Hypertension     Assessment: 38 YOM presenting with exertional dyspnea over the past 3 months. Pt found to have possible CHF and new onset atrial fibrillation, not on AC PTA. CT chest on 4/18 showed no evidence of PE. PMH significant for HTN and DM.   Pt at high risk of clot, CHADsVAsC=4 and low risk of bleed, HAS-BLED=1. MD requested anticoagulation with enoxaparin. Enoxaparin started 4/18 at 90 mg BID (1 mg/kg BID). Enoxaparin held today for [facility]. MD requested enoxaparin be restarted today 8 hr post sheath removal.   Goal of Therapy:  Monitor platelets by anticoagulation protocol: Yes   Plan:  Continue enoxaparin 90 mg BID starting at 2000  Monitor daily CBC  Monitor signs/symptoms of bleeding  Benna Dunks 09/04/2020,12:44 PM

## 2020-09-04 NOTE — Consult Note (Addendum)
Advanced Heart Failure Team Consult Note   Primary Physician: Jalene Mullet, PA-C PCP-Cardiologist:  No primary care provider on file.  Reason for Consultation: PAH/corpulmonale  HPI:    Gary [name] is seen today for evaluation of PAH at the request of Dr. Margaretann Loveless.   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  Admitted 09/03/20 with progressive SOB and hypoxic respiratory failure. Has had intermittent resp problems since Feb, but sx have worsened in the last couple of weeks to the point where he can only walk about 15 feet w/out getting SOB. Sleeps on a wedge chronically, denies PND. Denies LE edema or CP. Has not gained any weight but has been having problems recently with early satiety and p.o. intake is down.  Prior to this acute illness, he was able to do yard work and mow, no SOB or chest pain. Started noticing SOB picking up limbs back when the weather was bad. Sx gradually progressed.  On arrival to ER found to be in new onset AF (duration unknown) hs trop 98, 101 BNP 1,202  Chest CT: No PE. RML mass. Small BILATERAL pleural effusions greater on RIGHT.Patchy infiltrates RIGHT lung favor pneumonia. An area of opacity at the RIGHT middle lobe was present on the previous exam, little changed, 2.7 x 1.2 X 2.5 cm. Minimal dependent atelectasis LEFT lungwith LEFT lung otherwise clear.  Echo 09/03/20 showed EF 60-65% severe RV enlargement with severely depressed RV systolic  function and moderately elevated PASP. The interventricular septum is flattened in systole consistent with RV pressure overload. RVSP 59  Taken for cath today   LVEF 60-65%  Dist RCA lesion is 100% stenosed.  Ost LAD to Mid LAD lesion is 20% stenosed.  Mid LAD lesion is 40% stenosed.  Prox RCA lesion is 40% stenosed.   Findings:  Ao = 95/60 (76) LV = 95/14  RA = 12 RV = 60/13 PA = 66/31 (46) PCW = 13 Fick cardiac output/index = 4.1/2.1 PVR =  7.8 WU Ao sat = 99% PA sat =63%, 65%  Remains SOB at rest. Denies syncope/presyncope.    Review of Systems: [y] = yes, [ ]  = no   . General: Weight gain [ ] ; Weight loss [ ] ; Anorexia [ ] ; Fatigue Gary [name] ]; Fever [ ] ; Chills [ ] ; Weakness [ ]   . Cardiac: Chest pain/pressure [ ] ; Resting SOB Gary [name] ]; Exertional SOB Gary [name] ]; Orthopnea Gary [name] ]; Pedal Edema [ ] ; Palpitations [ ] ; Syncope [ ] ; Presyncope [ ] ; Paroxysmal nocturnal dyspnea[ ]   . Pulmonary: Cough [ y]; Wheezing[ ] ; Hemoptysis[ ] ; Sputum [ ] ; Snoring [ ]   . GI: Vomiting[ ] ; Dysphagia[ ] ; Melena[ ] ; Hematochezia [ ] ; Heartburn[y ]; Abdominal pain [ ] ; Constipation [ ] ; Diarrhea [ ] ; BRBPR [ ]   . GU: Hematuria[ ] ; Dysuria [ ] ; Nocturia[ ]   . Vascular: Pain in legs with walking [ ] ; Pain in feet with lying flat [ ] ; Non-healing sores [ ] ; Stroke [ ] ; TIA [ ] ; Slurred speech [ ] ;  . Neuro: Headaches[ ] ; Vertigo[ ] ; Seizures[ ] ; Paresthesias[ ] ;Blurred vision [ ] ; Diplopia [ ] ; Vision changes [ ]   . Ortho/Skin: Arthritis Gary [name] ]; Joint pain Gary [name] ]; Muscle pain [ ] ; Joint swelling [ ] ; Back Pain [ ] ; Rash [ ]   . Psych: Depression[ ] ; Anxiety[ ]   . Heme: Bleeding problems [ ] ; Clotting disorders [ ] ; Anemia [ ]   .  Endocrine: Diabetes Gary [name] ]; Thyroid dysfunction[ ]   Home Medications Prior to Admission medications   Medication Sig Start Date End Date Taking? Authorizing Provider  acetaminophen (TYLENOL) 325 MG tablet Take 650 mg by mouth every 6 (six) hours as needed for headache (pain).   Yes [provider]  amLODipine (NORVASC) 5 MG tablet Take 5 mg by mouth every morning.   Yes [provider]  aspirin EC 81 MG tablet Take 81 mg by mouth every morning. Swallow whole.   Yes [provider]  atorvastatin (LIPITOR) 40 MG tablet Take 40 mg by mouth every evening.   Yes [provider]  buPROPion (WELLBUTRIN SR) 150 MG 12 hr tablet Take 150 mg by mouth 2 (two) times daily. 08/01/20  Yes [provider]   enalapril-hydrochlorothiazide (VASERETIC) 10-25 MG per tablet Take 1 tablet by mouth at bedtime.   Yes [provider]  glipiZIDE (GLUCOTROL XL) 10 MG 24 hr tablet Take 10 mg by mouth every morning.   Yes [provider]  metFORMIN (GLUCOPHAGE) 850 MG tablet Take 850-1,700 mg by mouth See admin instructions. Take 2 tablets (1700 mg) by mouth every morning and 1 tablet (850 mg) at night 08/01/20  Yes [provider]  Multiple Vitamin (MULTIVITAMIN WITH MINERALS) TABS tablet Take 1 tablet by mouth daily.   Yes [provider]  pioglitazone (ACTOS) 15 MG tablet Take 15 mg by mouth 2 (two) times daily.   Yes [provider]  buPROPion (WELLBUTRIN XL) 150 MG 24 hr tablet Take 150 mg by mouth 2 (two) times daily. Patient not taking: No sig reported    [provider]    Past Medical History: Past Medical History:  Diagnosis Date  . Diabetes mellitus without complication ([place])   . Epistaxis 2014   Secondary to a nasal abnormality, treated and resolved  . Hyperlipidemia due to type 2 diabetes mellitus ([place])   . Hypertension     Past Surgical History: Past Surgical History:  Procedure Laterality Date  . NOSE SURGERY  2014  . PELVIC FRACTURE SURGERY  2013  . RIGHT/LEFT HEART CATH AND CORONARY ANGIOGRAPHY N/A 09/04/2020   Procedure: RIGHT/LEFT HEART CATH AND CORONARY ANGIOGRAPHY;  Surgeon: Jolaine Artist, MD;  Location: [facility];  Service: Cardiovascular;  Laterality: N/A;    Family History: Family History  Problem Relation Age of Onset  . Diabetes Mother   . Alzheimer's disease Mother   . Stroke Father   . Heart Problems Father        had a pacemaker  . Diabetes Brother     Social History: Social History   Socioeconomic History  . Marital status: Married    Spouse name: Not on file  . Number of children: Not on file  . Years of education: Not on file  . Highest education level: Not on file  Occupational History   . Occupation: Retired Arts administrator  Tobacco Use  . Smoking status: Never Smoker  . Smokeless tobacco: Never Used  Substance and Sexual Activity  . Alcohol use: No  . Drug use: No  . Sexual activity: Not on file  Other Topics Concern  . Not on file  Social History Narrative  . Not on file   Social Determinants of Health   Financial Resource Strain: Not on file  Food Insecurity: Not on file  Transportation Needs: Not on file  Physical Activity: Not on file  Stress: Not on file  Social Connections: Not on  file    Allergies:  Allergies  Allergen Reactions  . Novocain [Procaine] Other (See Comments)    Unknown reaction as a young child    Objective:    Vital Signs:   Temp:  [97.4 F (36.3 C)-98.4 F (36.9 C)] 98.4 F (36.9 C) (04/19 0400) Pulse Rate:  [61-130] 90 (04/19 1330) Resp:  [17-27] 24 (04/19 1330) BP: (93-119)/(67-83) 110/77 (04/19 1330) SpO2:  [0 %-100 %] 91 % (04/19 1330) Weight:  [86.3 kg] 86.3 kg (04/19 0400) Last BM Date: 09/04/20  Weight change: Filed Weights   09/04/20 0400  Weight: 86.3 kg    Intake/Output:   Intake/Output Summary (Last 24 hours) at 09/04/2020 1610 Last data filed at 09/04/2020 0847 Gross per 24 hour  Intake 440 ml  Output 2370 ml  Net -1930 ml      Physical Exam    General:  Obese male. SOB at rest  HEENT: normal Neck: supple. JVP to jaw. Carotids 2+ bilat; no bruits. No lymphadenopathy or thyromegaly appreciated. Cor: PMI nondisplaced. Regular rate & rhythm. 2/6 TR Lungs: coarse Abdomen: obese soft, nontender, nondistended. No hepatosplenomegaly. No bruits or masses. Good bowel sounds. Extremities: no cyanosis, clubbing, rash, edema Neuro: alert & orientedx3, cranial nerves grossly intact. moves all 4 extremities w/o difficulty. Affect pleasant   Telemetry   AF 90-105 Personally reviewed   EKG    AF 96 RBBB lateral ST-T wave abnormalities Personally reviewed   Labs   Basic Metabolic Panel: Recent Labs   Lab 09/03/20 1040 09/03/20 1803 09/04/20 0414 09/04/20 1054 09/04/20 1100 09/04/20 1118  NA 134* 137 134* 137 137  139 137  K 4.5 4.6 4.0 3.9 4.1  3.7 3.9  CL 99 101 99  --   --   --   CO2 21* 25 24  --   --   --   GLUCOSE 116* 82 75  --   --   --   BUN 41* 45* 49*  --   --   --   CREATININE 1.46* 1.57* 1.63*  --   --   --   CALCIUM 8.8* 8.7* 8.5*  --   --   --   MG  --   --  1.8  --   --   --     Liver Function Tests: No results for input(s): AST, ALT, ALKPHOS, BILITOT, PROT, ALBUMIN in the last 168 hours. No results for input(s): LIPASE, AMYLASE in the last 168 hours. No results for input(s): AMMONIA in the last 168 hours.  CBC: Recent Labs  Lab 09/03/20 1040 09/04/20 0414 09/04/20 1054 09/04/20 1100 09/04/20 1118  WBC 8.1 7.9  --   --   --   HGB 14.6 13.4 13.3 13.9  12.9* 12.9*  HCT 44.1 40.8 39.0 41.0  38.0* 38.0*  MCV 94.0 93.8  --   --   --   PLT 323 312  --   --   --     Cardiac Enzymes: No results for input(s): CKTOTAL, CKMB, CKMBINDEX, TROPONINI in the last 168 hours.  BNP: BNP (last 3 results) Recent Labs    09/03/20 1433  BNP 1,202.7*    ProBNP (last 3 results) No results for input(s): PROBNP in the last 8760 hours.   CBG: Recent Labs  Lab 09/03/20 1713 09/03/20 2103 09/04/20 0553 09/04/20 1251 09/04/20 1554  GLUCAP 86 114* 70 136* 157*    Coagulation Studies: No results for input(s): LABPROT, INR in the last 72 hours.  Imaging   CT ANGIO CHEST PE W OR WO CONTRAST  Result Date: 09/03/2020 CLINICAL DATA:  RIGHT ventricular dilatation, commas sign, worsening dyspnea, atrial fibrillation, suspected pulmonary embolism; history diabetes mellitus, hyperlipidemia, hypertension EXAM: CT ANGIOGRAPHY CHEST WITH CONTRAST TECHNIQUE: Multidetector CT imaging of the chest was performed using the standard protocol during bolus administration of intravenous contrast. Multiplanar CT image reconstructions and MIPs were obtained to evaluate the  vascular anatomy. CONTRAST:  23m OMNIPAQUE IOHEXOL 350 MG/ML SOLN IV COMPARISON:  06/27/2020 FINDINGS: Cardiovascular: Atherosclerotic calcifications aorta, proximal great vessels, and coronary arteries. Ascending thoracic aorta upper normal caliber 3.9 cm diameter. Enlargement of cardiac chambers especially RIGHT atrium and RIGHT ventricle. No pericardial effusion. Pulmonary arteries adequately opacified and patent. No evidence of pulmonary embolism. Enlargement of central pulmonary arteries consistent with pulmonary arterial hypertension. Mediastinum/Nodes: Esophagus unremarkable. Base of cervical region normal appearance. Normal sized RIGHT paratracheal lymph nodes up to 10 mm diameter single enlarged subcarinal node 1.8 cm image 38 previously 1.4 cm. Lungs/Pleura: Small BILATERAL pleural effusions greater on RIGHT. Patchy infiltrates RIGHT lung favor pneumonia. An area of opacity at the RIGHT middle lobe was present on the previous exam, little changed, 2.7 x 1.2 X 2.5 cm. Minimal dependent atelectasis LEFT lung with LEFT lung otherwise clear. Upper Abdomen: Reflux of contrast into IVC and hepatic veins. Visualized upper abdomen otherwise unremarkable. Musculoskeletal: Degenerative disc disease changes thoracic spine. Review of the MIP images confirms the above findings. IMPRESSION: No evidence of pulmonary embolism. Enlarged central pulmonary arteries consistent with pulmonary arterial hypertension. Associated enlargement of cardiac chambers especially RIGHT ventricle and RIGHT atrium. Small BILATERAL pleural effusions greater on RIGHT. Patchy infiltrates RIGHT lung favor pneumonia. Persistent area of opacity in the RIGHT middle lobe could represent atelectasis, unresolved infiltrate, tumor not excluded; recommend continued follow-up until resolution and if this fails to resolve then consider further assessment by PET-CT. Single enlarged subcarinal lymph node 1.8 cm diameter, previously 1.4 cm, nonspecific.  Aortic Atherosclerosis (ICD10-I70.0). Electronically Signed   By: MLavonia DanaM.D.   On: 09/03/2020 20:48   CARDIAC CATHETERIZATION  Result Date: 09/04/2020  Dist RCA lesion is 100% stenosed.  Ost LAD to Mid LAD lesion is 20% stenosed.  Mid LAD lesion is 40% stenosed.  Prox RCA lesion is 40% stenosed.  Findings: Ao = 95/60 (76) LV = 95/14 RA = 12 RV = 60/13 PA = 66/31 (46) PCW = 13 Fick cardiac output/index = 4.1/2.1 PVR = 7.8 WU Ao sat = 99% PA sat =63%, 65% Assessment: 1. 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 There is a large RV marginal branch that is widely patent 2. EF 60-65% 3. Moderate PAH with elevated PVR and moderately reduced cardiac output Plan/Discussion: Will manage CAD medically. Will need w/u for PAH and cor pulmonale . DGlori Bickers MD 11:51 AM   ECHOCARDIOGRAM COMPLETE  Result Date: 09/03/2020    ECHOCARDIOGRAM REPORT   Patient Name:   Gary DELDUCADate of Exam: 09/03/2020 Medical Rec #:  [id number]  Height:       67.5 in Accession #:    [id number] Weight:       232.0 lb Date of Birth:  6August 28, 1952  BSA:          2.166 m Patient Age:    89years    BP:           98/80 mmHg Patient Gender: M  HR:           89 bpm. Exam Location:  Inpatient Procedure: 2D Echo, Cardiac Doppler and Color Doppler Indications:    I48.91* Unspeicified atrial fibrillation  History:        Patient has no prior history of Echocardiogram examinations.                 Risk Factors:Former Smoker.  Sonographer:    Merrie Roof RDCS Referring Phys: [id number] CARINA M BROWN IMPRESSIONS  1. There is severe RV enlargement with severely depressed RV systolic function and moderately elevated PASP. The interventricular septum is flattened in systole consistent with RV pressure overload. Constellation of findings highly concerning for pulmonary embolism. Cardiology team aware and CTA pending.  2. Left ventricular ejection fraction, by estimation, is 55 to 60%. The  left ventricle has normal function. Wall motion difficult to assess due to poor visualization of the LV endocardium but appears grossly normal. There is mild concentric left ventricular hypertrophy. Diastolic function is indeterminant due to atrial fibrillation. There is the interventricular septum is flattened in systole, consistent with right ventricular pressure overload.  3. Right ventricular systolic function is severely reduced. The right ventricular size is severely enlarged. There is moderately elevated pulmonary artery systolic pressure. The estimated right ventricular systolic pressure is 76.7 mmHg.  4. Right atrial size was moderately dilated.  5. The mitral valve is grossly normal. Mild mitral valve regurgitation.  6. Tricuspid valve regurgitation is moderate.  7. The aortic valve is tricuspid. There is moderate calcification of the aortic valve. There is moderate thickening of the aortic valve. Aortic valve regurgitation is trivial. Mild to moderate aortic valve sclerosis/calcification is present, without any  evidence of aortic stenosis.  8. The inferior vena cava is dilated in size with <50% respiratory variability, suggesting right atrial pressure of 15 mmHg. Comparison(s): No prior Echocardiogram. FINDINGS  Left Ventricle: Left ventricular ejection fraction, by estimation, is 55 to 60%. The left ventricle has normal function. Wall motion diffiicult to assess due to poor visualization of LV endocardium but appears grossly normal. The left ventricular internal cavity size was normal in size. There is mild concentric left ventricular hypertrophy. The interventricular septum is flattened in systole, consistent with right ventricular pressure overload. Diastolic function is indeterminant due to atrial fibrillation. Right Ventricle: The right ventricular size is severely enlarged. Right vetricular wall thickness was not assessed. Right ventricular systolic function is severely reduced. There is moderately  elevated pulmonary artery systolic pressure. The tricuspid regurgitant velocity is 3.33 m/s, and with an assumed right atrial pressure of 15 mmHg, the estimated right ventricular systolic pressure is 34.1 mmHg. Left Atrium: Left atrial size was normal in size. Right Atrium: Right atrial size was moderately dilated. Pericardium: There is no evidence of pericardial effusion. Presence of pericardial fat pad. Mitral Valve: The mitral valve is grossly normal. There is mild thickening of the mitral valve leaflet(s). There is mild calcification of the mitral valve leaflet(s). Mild mitral annular calcification. Mild mitral valve regurgitation. Tricuspid Valve: The tricuspid valve is normal in structure. Tricuspid valve regurgitation is moderate. Aortic Valve: The aortic valve is tricuspid. There is moderate calcification of the aortic valve. There is moderate thickening of the aortic valve. Aortic valve regurgitation is trivial. Mild to moderate aortic valve sclerosis/calcification is present, without any evidence of aortic stenosis. Pulmonic Valve: The pulmonic valve was normal in structure. Pulmonic valve regurgitation is not visualized. Aorta: The aortic root is normal in size and structure. Venous: The  inferior vena cava is dilated in size with less than 50% respiratory variability, suggesting right atrial pressure of 15 mmHg. IAS/Shunts: No atrial level shunt detected by color flow Doppler.  LEFT VENTRICLE PLAX 2D LVIDd:         4.90 cm  Diastology LVIDs:         3.40 cm  LV e' lateral: 7.29 cm/s LV PW:         1.10 cm LV IVS:        1.00 cm LVOT diam:     2.00 cm LV SV:         34 LV SV Index:   16 LVOT Area:     3.14 cm  RIGHT VENTRICLE            IVC RV Basal diam:  4.70 cm    IVC diam: 2.70 cm RV Mid diam:    5.10 cm RV S prime:     9.57 cm/s TAPSE (M-mode): 0.6 cm LEFT ATRIUM             Index       RIGHT ATRIUM           Index LA diam:        4.20 cm 1.94 cm/m  RA Area:     23.30 cm LA Vol (A2C):   71.7 ml  33.10 ml/m RA Volume:   75.30 ml  34.77 ml/m LA Vol (A4C):   61.2 ml 28.26 ml/m LA Biplane Vol: 72.4 ml 33.43 ml/m  AORTIC VALVE LVOT Vmax:   74.40 cm/s LVOT Vmean:  51.400 cm/s LVOT VTI:    0.109 m  AORTA Ao Root diam: 3.30 cm TRICUSPID VALVE TR Peak grad:   44.4 mmHg TR Vmax:        333.00 cm/s  SHUNTS Systemic VTI:  0.11 m Systemic Diam: 2.00 cm Gwyndolyn Kaufman MD Electronically signed by Gwyndolyn Kaufman MD Signature Date/Time: 09/03/2020/8:05:33 PM    Final       Medications:     Current Medications: . buPROPion  150 mg Oral BID  . enoxaparin (LOVENOX) injection  90 mg Subcutaneous Q12H  . insulin aspart  0-9 Units Subcutaneous TID WC  . sodium chloride flush  3 mL Intravenous Q12H  . sodium chloride flush  3 mL Intravenous Q12H     Infusions: . sodium chloride    . sodium chloride Stopped (09/04/20 1558)  . sodium chloride         Assessment/Plan   1. PAH with cor pulmonale - CT chest 4/22: No PE or ILD - Echo LVEF 60% severe RV dilation and HK - Cath with moderate PAH. PA = 66/31 (46) PCW = 13 Fick = 4.1/2.1 PVR = 7.8 WU - Suspect this is WHO Group 3 PAH due to hypoxic lung disease/OHS/OSA - Will order PFTs (when better compensated) and will need outpatient sleep study - Check serologies for completeness sake - Will need home O2 - Refer to Pulmonary Rehab - Stressed need for weight loss  2. Paroxysmal AF - new onset. Unclear duration  - would switch to Eliquis and then plan DC-CV in 3-4 weeks if tolerated/rate controlled - otherwise will need TEE/DC-CV - May need AAD as wil be high risk for re-occurrence   3. CAD  - mostly non-obstructive - start statin.  - No ASA with AC  4. R lung mass - will need CT f/u    Length of Stay: 0  Glori Bickers, MD  09/04/2020, 4:10  PM  Advanced Heart Failure Team Pager [phone] (M-F; 7a - 5p)  Please contact [facility] for night-coverage after hours (4p -7a ) and weekends on [email or web address]

## 2020-09-04 NOTE — Progress Notes (Signed)
Pt transported by Cath Lab transporter; pt prepared for transport with wife at bedside; placed on NRB mask (which pt has worn since admission, either 15LHFNC or NRB); O2 sats maintaining above 92% per order; pt endorsed some anxiety regarding procedure but no SOB more than normal nor did the pt appear to be in distress; Received call from cath lab RN stating pt arrived in which appeared to be "resp distress." "and next time a RN should have traveled with pt because it put the team in a predicament." Acknowledged concerns and asked if team will be preceding with heart cath or not due "distress." "RN stated "yes." Conversation ended.

## 2020-09-04 NOTE — Progress Notes (Signed)
PT Cancellation Note  Patient Details Name: Gary [name] MRN: [id number] DOB: March 08, 1951   Cancelled Treatment:    Reason Eval/Treat Not Completed: Patient at procedure or test/unavailable  Pt off floor at cath lab. Will follow up as time allows.  Marguarite Arbour A Ashna Dorough 09/04/2020, 10:30 AM Marisa Severin, PT, DPT Acute Rehabilitation Services Pager [phone] Office [phone]

## 2020-09-04 NOTE — Interval H&P Note (Signed)
History and Physical Interval Note:  09/04/2020 10:21 AM  Gary [name]  has presented today for surgery, with the diagnosis of heart failure.  The various methods of treatment have been discussed with the patient and family. After consideration of risks, benefits and other options for treatment, the patient has consented to  Procedure(s): RIGHT/LEFT HEART CATH AND CORONARY ANGIOGRAPHY (N/A) and possible coronary angioplasty as a surgical intervention.  The patient's history has been reviewed, patient examined, no change in status, stable for surgery.  I have reviewed the patient's chart and labs.  Questions were answered to the patient's satisfaction.     [name]

## 2020-09-04 NOTE — H&P (View-Only) (Signed)
Progress Note  Patient Name: Gary [name] Date of Encounter: 09/04/2020  Primary Cardiologist: No primary care provider on file.   Subjective   Remains SOB. No CP. Discussed results of CT and Echo with patient and wife. On 15 L [place] but placed in mouth, sats 93-94%.   Inpatient Medications    Scheduled Meds: . aspirin  81 mg Oral Pre-Cath  . buPROPion  150 mg Oral BID  . enoxaparin (LOVENOX) injection  90 mg Subcutaneous Q12H  . insulin aspart  0-9 Units Subcutaneous TID WC   Continuous Infusions: . sodium chloride     PRN Meds:    Vital Signs    Vitals:   09/04/20 0200 09/04/20 0202 09/04/20 0400 09/04/20 0650  BP:   104/78   Pulse:   87   Resp:   (!) 23   Temp:   98.4 F (36.9 C)   TempSrc:   Axillary   SpO2: (!) 84% 94% 96% 94%  Weight:   86.3 kg   Height:        Intake/Output Summary (Last 24 hours) at 09/04/2020 0928 Last data filed at 09/04/2020 0847 Gross per 24 hour  Intake 440 ml  Output 2370 ml  Net -1930 ml   Filed Weights   09/04/20 0400  Weight: 86.3 kg    Telemetry    Afib rates 80-100 - Personally Reviewed  ECG    Afib, RBBB, repol change T wave abnl  - Personally Reviewed  Physical Exam   GEN: No acute distress.   Neck: JVP to mid 1/3 of neck at 30 deg supine Cardiac: irregular rhythm, normal rate, no murmurs, rubs, or gallops.  Respiratory: Clear to auscultation bilaterally. GI: Soft, nontender, non-distended  MS: No edema; No deformity. Neuro:  Nonfocal  Psych: Normal affect   Labs    Chemistry Recent Labs  Lab 09/03/20 1040 09/03/20 1803 09/04/20 0414  NA 134* 137 134*  K 4.5 4.6 4.0  CL 99 101 99  CO2 21* 25 24  GLUCOSE 116* 82 75  BUN 41* 45* 49*  CREATININE 1.46* 1.57* 1.63*  CALCIUM 8.8* 8.7* 8.5*  GFRNONAA 51* 47* 45*  ANIONGAP 14 11 11      Hematology Recent Labs  Lab 09/03/20 1040 09/04/20 0414  WBC 8.1 7.9  RBC 4.69 4.35  HGB 14.6 13.4  HCT 44.1 40.8  MCV 94.0 93.8  MCH 31.1 30.8  MCHC 33.1  32.8  RDW 14.5 14.3  PLT 323 312    Cardiac EnzymesNo results for input(s): TROPONINI in the last 168 hours. No results for input(s): TROPIPOC in the last 168 hours.   BNP Recent Labs  Lab 09/03/20 1433  BNP 1,202.7*     DDimer No results for input(s): DDIMER in the last 168 hours.   Radiology    CT ANGIO CHEST PE W OR WO CONTRAST  Result Date: 09/03/2020 CLINICAL DATA:  RIGHT ventricular dilatation, commas sign, worsening dyspnea, atrial fibrillation, suspected pulmonary embolism; history diabetes mellitus, hyperlipidemia, hypertension EXAM: CT ANGIOGRAPHY CHEST WITH CONTRAST TECHNIQUE: Multidetector CT imaging of the chest was performed using the standard protocol during bolus administration of intravenous contrast. Multiplanar CT image reconstructions and MIPs were obtained to evaluate the vascular anatomy. CONTRAST:  2m OMNIPAQUE IOHEXOL 350 MG/ML SOLN IV COMPARISON:  06/27/2020 FINDINGS: Cardiovascular: Atherosclerotic calcifications aorta, proximal great vessels, and coronary arteries. Ascending thoracic aorta upper normal caliber 3.9 cm diameter. Enlargement of cardiac chambers especially RIGHT atrium and RIGHT ventricle. No pericardial effusion. Pulmonary arteries  adequately opacified and patent. No evidence of pulmonary embolism. Enlargement of central pulmonary arteries consistent with pulmonary arterial hypertension. Mediastinum/Nodes: Esophagus unremarkable. Base of cervical region normal appearance. Normal sized RIGHT paratracheal lymph nodes up to 10 mm diameter single enlarged subcarinal node 1.8 cm image 38 previously 1.4 cm. Lungs/Pleura: Small BILATERAL pleural effusions greater on RIGHT. Patchy infiltrates RIGHT lung favor pneumonia. An area of opacity at the RIGHT middle lobe was present on the previous exam, little changed, 2.7 x 1.2 X 2.5 cm. Minimal dependent atelectasis LEFT lung with LEFT lung otherwise clear. Upper Abdomen: Reflux of contrast into IVC and hepatic  veins. Visualized upper abdomen otherwise unremarkable. Musculoskeletal: Degenerative disc disease changes thoracic spine. Review of the MIP images confirms the above findings. IMPRESSION: No evidence of pulmonary embolism. Enlarged central pulmonary arteries consistent with pulmonary arterial hypertension. Associated enlargement of cardiac chambers especially RIGHT ventricle and RIGHT atrium. Small BILATERAL pleural effusions greater on RIGHT. Patchy infiltrates RIGHT lung favor pneumonia. Persistent area of opacity in the RIGHT middle lobe could represent atelectasis, unresolved infiltrate, tumor not excluded; recommend continued follow-up until resolution and if this fails to resolve then consider further assessment by PET-CT. Single enlarged subcarinal lymph node 1.8 cm diameter, previously 1.4 cm, nonspecific. Aortic Atherosclerosis (ICD10-I70.0). Electronically Signed   By: Lavonia Dana M.D.   On: 09/03/2020 20:48   DG Chest Portable 1 View  Result Date: 09/03/2020 CLINICAL DATA:  Dyspnea on exertion. EXAM: PORTABLE CHEST 1 VIEW COMPARISON:  None. FINDINGS: Mild cardiomegaly is noted. No pneumothorax is noted. Bibasilar atelectasis, edema or infiltrates are noted with probable small left pleural effusion. Bony thorax is unremarkable. IMPRESSION: Bibasilar atelectasis, edema or infiltrates are noted with probable small left pleural effusion. Electronically Signed   By: Marijo Conception M.D.   On: 09/03/2020 10:32   ECHOCARDIOGRAM COMPLETE  Result Date: 09/03/2020    ECHOCARDIOGRAM REPORT   Patient Name:   Gary [name] Date of Exam: 09/03/2020 Medical Rec #:  [id number]   Height:       67.5 in Accession #:    [id number]  Weight:       232.0 lb Date of Birth:  February 03, 1951   BSA:          2.166 m Patient Age:    70 years    BP:           98/80 mmHg Patient Gender: M           HR:           89 bpm. Exam Location:  Inpatient Procedure: 2D Echo, Cardiac Doppler and Color Doppler Indications:    I48.91* Unspeicified  atrial fibrillation  History:        Patient has no prior history of Echocardiogram examinations.                 Risk Factors:Former Smoker.  Sonographer:    Merrie Roof RDCS Referring Phys: [id number] CARINA M BROWN IMPRESSIONS  1. There is severe RV enlargement with severely depressed RV systolic function and moderately elevated PASP. The interventricular septum is flattened in systole consistent with RV pressure overload. Constellation of findings highly concerning for pulmonary embolism. Cardiology team aware and CTA pending.  2. Left ventricular ejection fraction, by estimation, is 55 to 60%. The left ventricle has normal function. Wall motion difficult to assess due to poor visualization of the LV endocardium but appears grossly normal. There is mild concentric left ventricular hypertrophy. Diastolic function is indeterminant due to atrial  fibrillation. There is the interventricular septum is flattened in systole, consistent with right ventricular pressure overload.  3. Right ventricular systolic function is severely reduced. The right ventricular size is severely enlarged. There is moderately elevated pulmonary artery systolic pressure. The estimated right ventricular systolic pressure is 19.5 mmHg.  4. Right atrial size was moderately dilated.  5. The mitral valve is grossly normal. Mild mitral valve regurgitation.  6. Tricuspid valve regurgitation is moderate.  7. The aortic valve is tricuspid. There is moderate calcification of the aortic valve. There is moderate thickening of the aortic valve. Aortic valve regurgitation is trivial. Mild to moderate aortic valve sclerosis/calcification is present, without any  evidence of aortic stenosis.  8. The inferior vena cava is dilated in size with <50% respiratory variability, suggesting right atrial pressure of 15 mmHg. Comparison(s): No prior Echocardiogram. FINDINGS  Left Ventricle: Left ventricular ejection fraction, by estimation, is 55 to 60%. The left  ventricle has normal function. Wall motion diffiicult to assess due to poor visualization of LV endocardium but appears grossly normal. The left ventricular internal cavity size was normal in size. There is mild concentric left ventricular hypertrophy. The interventricular septum is flattened in systole, consistent with right ventricular pressure overload. Diastolic function is indeterminant due to atrial fibrillation. Right Ventricle: The right ventricular size is severely enlarged. Right vetricular wall thickness was not assessed. Right ventricular systolic function is severely reduced. There is moderately elevated pulmonary artery systolic pressure. The tricuspid regurgitant velocity is 3.33 m/s, and with an assumed right atrial pressure of 15 mmHg, the estimated right ventricular systolic pressure is 09.3 mmHg. Left Atrium: Left atrial size was normal in size. Right Atrium: Right atrial size was moderately dilated. Pericardium: There is no evidence of pericardial effusion. Presence of pericardial fat pad. Mitral Valve: The mitral valve is grossly normal. There is mild thickening of the mitral valve leaflet(s). There is mild calcification of the mitral valve leaflet(s). Mild mitral annular calcification. Mild mitral valve regurgitation. Tricuspid Valve: The tricuspid valve is normal in structure. Tricuspid valve regurgitation is moderate. Aortic Valve: The aortic valve is tricuspid. There is moderate calcification of the aortic valve. There is moderate thickening of the aortic valve. Aortic valve regurgitation is trivial. Mild to moderate aortic valve sclerosis/calcification is present, without any evidence of aortic stenosis. Pulmonic Valve: The pulmonic valve was normal in structure. Pulmonic valve regurgitation is not visualized. Aorta: The aortic root is normal in size and structure. Venous: The inferior vena cava is dilated in size with less than 50% respiratory variability, suggesting right atrial pressure  of 15 mmHg. IAS/Shunts: No atrial level shunt detected by color flow Doppler.  LEFT VENTRICLE PLAX 2D LVIDd:         4.90 cm  Diastology LVIDs:         3.40 cm  LV e' lateral: 7.29 cm/s LV PW:         1.10 cm LV IVS:        1.00 cm LVOT diam:     2.00 cm LV SV:         34 LV SV Index:   16 LVOT Area:     3.14 cm  RIGHT VENTRICLE            IVC RV Basal diam:  4.70 cm    IVC diam: 2.70 cm RV Mid diam:    5.10 cm RV S prime:     9.57 cm/s TAPSE (M-mode): 0.6 cm LEFT ATRIUM  Index       RIGHT ATRIUM           Index LA diam:        4.20 cm 1.94 cm/m  RA Area:     23.30 cm LA Vol (A2C):   71.7 ml 33.10 ml/m RA Volume:   75.30 ml  34.77 ml/m LA Vol (A4C):   61.2 ml 28.26 ml/m LA Biplane Vol: 72.4 ml 33.43 ml/m  AORTIC VALVE LVOT Vmax:   74.40 cm/s LVOT Vmean:  51.400 cm/s LVOT VTI:    0.109 m  AORTA Ao Root diam: 3.30 cm TRICUSPID VALVE TR Peak grad:   44.4 mmHg TR Vmax:        333.00 cm/s  SHUNTS Systemic VTI:  0.11 m Systemic Diam: 2.00 cm Gwyndolyn Kaufman MD Electronically signed by Gwyndolyn Kaufman MD Signature Date/Time: 09/03/2020/8:05:33 PM    Final     Cardiac Studies  R/LHC pending  echo   1. There is severe RV enlargement with severely depressed RV systolic  function and moderately elevated PASP. The interventricular septum is  flattened in systole consistent with RV pressure overload. Constellation  of findings highly concerning for  pulmonary embolism. Cardiology team aware and CTA pending.   2. Left ventricular ejection fraction, by estimation, is 55 to 60%. The  left ventricle has normal function. Wall motion difficult to assess due to  poor visualization of the LV endocardium but appears grossly normal. There  is mild concentric left  ventricular hypertrophy. Diastolic function is indeterminant due to atrial  fibrillation. There is the interventricular septum is flattened in  systole, consistent with right ventricular pressure overload.   3. Right ventricular systolic  function is severely reduced. The right  ventricular size is severely enlarged. There is moderately elevated  pulmonary artery systolic pressure. The estimated right ventricular  systolic pressure is 82.9 mmHg.   4. Right atrial size was moderately dilated.   5. The mitral valve is grossly normal. Mild mitral valve regurgitation.   6. Tricuspid valve regurgitation is moderate.   7. The aortic valve is tricuspid. There is moderate calcification of the  aortic valve. There is moderate thickening of the aortic valve. Aortic  valve regurgitation is trivial. Mild to moderate aortic valve  sclerosis/calcification is present, without any   evidence of aortic stenosis.   8. The inferior vena cava is dilated in size with <50% respiratory  variability, suggesting right atrial pressure of 15 mmHg.    Patient Profile     70 y.o. male with progressive dyspnea. 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   Assessment & Plan   Principal Problem:   Acute hypoxemic respiratory failure (HCC) Active Problems:   CHF exacerbation (HCC)   Hyperlipidemia due to type 2 diabetes mellitus (HCC)   Atrial fibrillation with rapid ventricular response (HCC)   Right bundle branch block   Acute hypoxic respiratory failure Right heart failure Pulmonary HTN - d/w with Dr. Haroldine Laws, will plan for [facility] today for further evaluation of right heart failure.  - No PE on CT. Etiologies may include undiagnosed OSA, primary pulmonary HTN, restrictive CM, ischemic HD. - No gross abnormalities of lung parenchyma on independent review. 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  -Cr:1.63 increased 2/2 to contrast yesterday UOP yesterday: -1.47 Weight:  86 kg Net negative for admission: -1.9L Diuretic plan: continue lasix  80 mg IV BID, diuretic plan to be adjusted based on results of RHC.  INFORMED CONSENT: I have reviewed the risks, indications, and alternatives to cardiac catheterization, possible angioplasty, and stenting with the patient. Risks include but are not limited to bleeding, infection, vascular injury, stroke, myocardial infection, arrhythmia, kidney injury, radiation-related injury in the case of prolonged fluoroscopy use, emergency cardiac surgery, and death. The patient understands the risks of serious complication is 1-2 in 7282 with diagnostic cardiac cath and 1-2% or less with angioplasty/stenting.    Afib - overall rate controlled with no additional therapy.       For questions or updates, please contact [place] Please consult [email or web address] for contact info under        Signed, Elouise Munroe, MD  09/04/2020, 9:28 AM

## 2020-09-04 NOTE — TOC Benefit Eligibility Note (Signed)
Patient Teacher, English as a foreign language completed.    The patient is currently admitted and upon discharge could be taking Eliquis 5 mg.  The current 30 day co-pay is, $47.00.   The patient is insured through [place], [place] Patient Advocate Specialist [place] Team Direct Number: [phone]  Fax: [phone]

## 2020-09-04 NOTE — Evaluation (Signed)
Physical Therapy Evaluation Patient Details Name: Gary [name] MRN: [id number] DOB: 05-Jul-1950 Today's Date: 09/04/2020   History of Present Illness  Patient is a 70 y/o male who presents on 09/03/20 with progressive SOB. Found to have acute respiratory failure likely due to new onset of heart failure and A-fib with RVR. CXR- Rt effusion, prominent interstitial edema/disease. s/p cardiac cath 09/04/20. PMh includes HTN and DM.  Clinical Impression  Patient presents with dyspnea on exertion, generalized weakness, decreased activity tolerance and impaired mobility s/p above. Pt lives at home with wife and 10 dogs and reports being independent for ADLs/IADLs and walking PTA. Today, pt tolerated bed mobility, transfers, standing and taking a few steps/marching with Min guard-Min A for support. Sp02 ranged from 85%-98% on 15L HF [place], HR up to 124 bpm max A-fib with activity. Pt tends to hold breath and needs cues to breathe. Education on importance of upright, pursed lip breathing and slowly increasing mobility. Will follow acutely to maximize independence and mobility prior to return home.    Follow Up Recommendations No PT follow up;Supervision - Intermittent (pending progression)    Equipment Recommendations  None recommended by PT    Recommendations for Other Services       Precautions / Restrictions Precautions Precautions: Other (comment) Precaution Comments: watch 02 (on high flow), HR Restrictions Weight Bearing Restrictions: No      Mobility  Bed Mobility Overal bed mobility: Needs Assistance Bed Mobility: Rolling;Sidelying to Sit Rolling: Min assist Sidelying to sit: Min assist;HOB elevated       General bed mobility comments: Assist neededn with trunk as pt not using RUE due to recent cath.    Transfers Overall transfer level: Needs assistance Equipment used: 1 person hand held assist Transfers: Sit to/from Stand Sit to Stand: Min assist         General transfer  comment: Assist to power to standing with cues for technique and anterior weight shift. No dizziness.  Ambulation/Gait Ambulation/Gait assistance: Min assist Gait Distance (Feet): 3 Feet Assistive device: 1 person hand held assist   Gait velocity: decreased   General Gait Details: Able to take a few steps along side bed with Min A for balance; 2/4 DOE. SP02 dropped to 87% on HF [place], HR up to 124 bpm max, A-fib. Pt holds breath at times and needs reminders to breathe- baseline per wife.  Stairs            Wheelchair Mobility    Modified Rankin (Stroke Patients Only)       Balance Overall balance assessment: Needs assistance Sitting-balance support: Feet supported;No upper extremity supported Sitting balance-Leahy Scale: Good     Standing balance support: During functional activity Standing balance-Leahy Scale: Fair Standing balance comment: Able to stand for a few mins to relieve pressure from bottom and being in bed with min guard for safety.                             Pertinent Vitals/Pain Pain Assessment: No/denies pain    Home Living Family/patient expects to be discharged to:: Private residence Living Arrangements: Spouse/significant other Available Help at Discharge: Family;Available PRN/intermittently Type of Home: House Home Access: Stairs to enter Entrance Stairs-Rails: None Entrance Stairs-Number of Steps: 2 Home Layout: One level Home Equipment: Shower seat      Prior Function Level of Independence: Independent         Comments: Does ADLs, IADLs, cooking/cleaning. Cares for 10 dogs  at home. Retired Arts administrator.     Hand Dominance   Dominant Hand: Right    Extremity/Trunk Assessment   Upper Extremity Assessment Upper Extremity Assessment: Defer to OT evaluation    Lower Extremity Assessment Lower Extremity Assessment: Generalized weakness (trouble with knees per wife)       Communication   Communication: No difficulties   Cognition Arousal/Alertness: Awake/alert Behavior During Therapy: WFL for tasks assessed/performed Overall Cognitive Status: Within Functional Limits for tasks assessed                                        General Comments General comments (skin integrity, edema, etc.): Wife present during session. Sp02 ranged from 85-98% on 15L HF [place], HR up to 124 bpm max A-fib with activity.    Exercises     Assessment/Plan    PT Assessment Patient needs continued PT services  PT Problem List Decreased mobility;Cardiopulmonary status limiting activity;Decreased activity tolerance       PT Treatment Interventions Therapeutic exercise;Gait training;Stair training;Patient/family education;Therapeutic activities;Functional mobility training    PT Goals (Current goals can be found in the Care Plan section)  Acute Rehab PT Goals Patient Stated Goal: return to independence, be able to breathe PT Goal Formulation: With patient Time For Goal Achievement: 09/18/20 Potential to Achieve Goals: Good    Frequency Min 3X/week   Barriers to discharge Decreased caregiver support wife works    Co-evaluation               AM-PAC PT "6 Clicks" Mobility  Outcome Measure Help needed turning from your back to your side while in a flat bed without using bedrails?: A Little Help needed moving from lying on your back to sitting on the side of a flat bed without using bedrails?: A Little Help needed moving to and from a bed to a chair (including a wheelchair)?: A Little Help needed standing up from a chair using your arms (e.g., wheelchair or bedside chair)?: A Little Help needed to walk in hospital room?: A Little Help needed climbing 3-5 steps with a railing? : A Little 6 Click Score: 18    End of Session Equipment Utilized During Treatment: Oxygen Activity Tolerance: Patient tolerated treatment well Patient left: in bed;with call bell/phone within reach;with family/visitor present  (sitting EOB) Nurse Communication: Mobility status PT Visit Diagnosis: Muscle weakness (generalized) (M62.81);Difficulty in walking, not elsewhere classified (R26.2);Other (comment) (dyspnea)    Time: 2706-2376 PT Time Calculation (min) (ACUTE ONLY): 21 min   Charges:   PT Evaluation $PT Eval Moderate Complexity: 1 Mod          Marisa Severin, PT, DPT Acute Rehabilitation Services Pager [phone] Office [phone]      Marguarite Arbour A Sabra Heck 09/04/2020, 3:06 PM

## 2020-09-04 NOTE — Progress Notes (Signed)
Progress Note  Patient Name: Gary [name] Date of Encounter: 09/04/2020  Primary Cardiologist: No primary care provider on file.   Subjective   Remains SOB. No CP. Discussed results of CT and Echo with patient and wife. On 15 L [place] but placed in mouth, sats 93-94%.   Inpatient Medications    Scheduled Meds: . aspirin  81 mg Oral Pre-Cath  . buPROPion  150 mg Oral BID  . enoxaparin (LOVENOX) injection  90 mg Subcutaneous Q12H  . insulin aspart  0-9 Units Subcutaneous TID WC   Continuous Infusions: . sodium chloride     PRN Meds:    Vital Signs    Vitals:   09/04/20 0200 09/04/20 0202 09/04/20 0400 09/04/20 0650  BP:   104/78   Pulse:   87   Resp:   (!) 23   Temp:   98.4 F (36.9 C)   TempSrc:   Axillary   SpO2: (!) 84% 94% 96% 94%  Weight:   86.3 kg   Height:        Intake/Output Summary (Last 24 hours) at 09/04/2020 0928 Last data filed at 09/04/2020 0847 Gross per 24 hour  Intake 440 ml  Output 2370 ml  Net -1930 ml   Filed Weights   09/04/20 0400  Weight: 86.3 kg    Telemetry    Afib rates 80-100 - Personally Reviewed  ECG    Afib, RBBB, repol change T wave abnl  - Personally Reviewed  Physical Exam   GEN: No acute distress.   Neck: JVP to mid 1/3 of neck at 30 deg supine Cardiac: irregular rhythm, normal rate, no murmurs, rubs, or gallops.  Respiratory: Clear to auscultation bilaterally. GI: Soft, nontender, non-distended  MS: No edema; No deformity. Neuro:  Nonfocal  Psych: Normal affect   Labs    Chemistry Recent Labs  Lab 09/03/20 1040 09/03/20 1803 09/04/20 0414  NA 134* 137 134*  K 4.5 4.6 4.0  CL 99 101 99  CO2 21* 25 24  GLUCOSE 116* 82 75  BUN 41* 45* 49*  CREATININE 1.46* 1.57* 1.63*  CALCIUM 8.8* 8.7* 8.5*  GFRNONAA 51* 47* 45*  ANIONGAP 14 11 11      Hematology Recent Labs  Lab 09/03/20 1040 09/04/20 0414  WBC 8.1 7.9  RBC 4.69 4.35  HGB 14.6 13.4  HCT 44.1 40.8  MCV 94.0 93.8  MCH 31.1 30.8  MCHC 33.1  32.8  RDW 14.5 14.3  PLT 323 312    Cardiac EnzymesNo results for input(s): TROPONINI in the last 168 hours. No results for input(s): TROPIPOC in the last 168 hours.   BNP Recent Labs  Lab 09/03/20 1433  BNP 1,202.7*     DDimer No results for input(s): DDIMER in the last 168 hours.   Radiology    CT ANGIO CHEST PE W OR WO CONTRAST  Result Date: 09/03/2020 CLINICAL DATA:  RIGHT ventricular dilatation, commas sign, worsening dyspnea, atrial fibrillation, suspected pulmonary embolism; history diabetes mellitus, hyperlipidemia, hypertension EXAM: CT ANGIOGRAPHY CHEST WITH CONTRAST TECHNIQUE: Multidetector CT imaging of the chest was performed using the standard protocol during bolus administration of intravenous contrast. Multiplanar CT image reconstructions and MIPs were obtained to evaluate the vascular anatomy. CONTRAST:  49m OMNIPAQUE IOHEXOL 350 MG/ML SOLN IV COMPARISON:  06/27/2020 FINDINGS: Cardiovascular: Atherosclerotic calcifications aorta, proximal great vessels, and coronary arteries. Ascending thoracic aorta upper normal caliber 3.9 cm diameter. Enlargement of cardiac chambers especially RIGHT atrium and RIGHT ventricle. No pericardial effusion. Pulmonary arteries  adequately opacified and patent. No evidence of pulmonary embolism. Enlargement of central pulmonary arteries consistent with pulmonary arterial hypertension. Mediastinum/Nodes: Esophagus unremarkable. Base of cervical region normal appearance. Normal sized RIGHT paratracheal lymph nodes up to 10 mm diameter single enlarged subcarinal node 1.8 cm image 38 previously 1.4 cm. Lungs/Pleura: Small BILATERAL pleural effusions greater on RIGHT. Patchy infiltrates RIGHT lung favor pneumonia. An area of opacity at the RIGHT middle lobe was present on the previous exam, little changed, 2.7 x 1.2 X 2.5 cm. Minimal dependent atelectasis LEFT lung with LEFT lung otherwise clear. Upper Abdomen: Reflux of contrast into IVC and hepatic  veins. Visualized upper abdomen otherwise unremarkable. Musculoskeletal: Degenerative disc disease changes thoracic spine. Review of the MIP images confirms the above findings. IMPRESSION: No evidence of pulmonary embolism. Enlarged central pulmonary arteries consistent with pulmonary arterial hypertension. Associated enlargement of cardiac chambers especially RIGHT ventricle and RIGHT atrium. Small BILATERAL pleural effusions greater on RIGHT. Patchy infiltrates RIGHT lung favor pneumonia. Persistent area of opacity in the RIGHT middle lobe could represent atelectasis, unresolved infiltrate, tumor not excluded; recommend continued follow-up until resolution and if this fails to resolve then consider further assessment by PET-CT. Single enlarged subcarinal lymph node 1.8 cm diameter, previously 1.4 cm, nonspecific. Aortic Atherosclerosis (ICD10-I70.0). Electronically Signed   By: Lavonia Dana M.D.   On: 09/03/2020 20:48   DG Chest Portable 1 View  Result Date: 09/03/2020 CLINICAL DATA:  Dyspnea on exertion. EXAM: PORTABLE CHEST 1 VIEW COMPARISON:  None. FINDINGS: Mild cardiomegaly is noted. No pneumothorax is noted. Bibasilar atelectasis, edema or infiltrates are noted with probable small left pleural effusion. Bony thorax is unremarkable. IMPRESSION: Bibasilar atelectasis, edema or infiltrates are noted with probable small left pleural effusion. Electronically Signed   By: Marijo Conception M.D.   On: 09/03/2020 10:32   ECHOCARDIOGRAM COMPLETE  Result Date: 09/03/2020    ECHOCARDIOGRAM REPORT   Patient Name:   Gary [name] Date of Exam: 09/03/2020 Medical Rec #:  [id number]   Height:       67.5 in Accession #:    [id number]  Weight:       232.0 lb Date of Birth:  1950/07/08   BSA:          2.166 m Patient Age:    70 years    BP:           98/80 mmHg Patient Gender: M           HR:           89 bpm. Exam Location:  Inpatient Procedure: 2D Echo, Cardiac Doppler and Color Doppler Indications:    I48.91* Unspeicified  atrial fibrillation  History:        Patient has no prior history of Echocardiogram examinations.                 Risk Factors:Former Smoker.  Sonographer:    Merrie Roof RDCS Referring Phys: [id number] CARINA M BROWN IMPRESSIONS  1. There is severe RV enlargement with severely depressed RV systolic function and moderately elevated PASP. The interventricular septum is flattened in systole consistent with RV pressure overload. Constellation of findings highly concerning for pulmonary embolism. Cardiology team aware and CTA pending.  2. Left ventricular ejection fraction, by estimation, is 55 to 60%. The left ventricle has normal function. Wall motion difficult to assess due to poor visualization of the LV endocardium but appears grossly normal. There is mild concentric left ventricular hypertrophy. Diastolic function is indeterminant due to atrial  fibrillation. There is the interventricular septum is flattened in systole, consistent with right ventricular pressure overload.  3. Right ventricular systolic function is severely reduced. The right ventricular size is severely enlarged. There is moderately elevated pulmonary artery systolic pressure. The estimated right ventricular systolic pressure is 75.9 mmHg.  4. Right atrial size was moderately dilated.  5. The mitral valve is grossly normal. Mild mitral valve regurgitation.  6. Tricuspid valve regurgitation is moderate.  7. The aortic valve is tricuspid. There is moderate calcification of the aortic valve. There is moderate thickening of the aortic valve. Aortic valve regurgitation is trivial. Mild to moderate aortic valve sclerosis/calcification is present, without any  evidence of aortic stenosis.  8. The inferior vena cava is dilated in size with <50% respiratory variability, suggesting right atrial pressure of 15 mmHg. Comparison(s): No prior Echocardiogram. FINDINGS  Left Ventricle: Left ventricular ejection fraction, by estimation, is 55 to 60%. The left  ventricle has normal function. Wall motion diffiicult to assess due to poor visualization of LV endocardium but appears grossly normal. The left ventricular internal cavity size was normal in size. There is mild concentric left ventricular hypertrophy. The interventricular septum is flattened in systole, consistent with right ventricular pressure overload. Diastolic function is indeterminant due to atrial fibrillation. Right Ventricle: The right ventricular size is severely enlarged. Right vetricular wall thickness was not assessed. Right ventricular systolic function is severely reduced. There is moderately elevated pulmonary artery systolic pressure. The tricuspid regurgitant velocity is 3.33 m/s, and with an assumed right atrial pressure of 15 mmHg, the estimated right ventricular systolic pressure is 16.3 mmHg. Left Atrium: Left atrial size was normal in size. Right Atrium: Right atrial size was moderately dilated. Pericardium: There is no evidence of pericardial effusion. Presence of pericardial fat pad. Mitral Valve: The mitral valve is grossly normal. There is mild thickening of the mitral valve leaflet(s). There is mild calcification of the mitral valve leaflet(s). Mild mitral annular calcification. Mild mitral valve regurgitation. Tricuspid Valve: The tricuspid valve is normal in structure. Tricuspid valve regurgitation is moderate. Aortic Valve: The aortic valve is tricuspid. There is moderate calcification of the aortic valve. There is moderate thickening of the aortic valve. Aortic valve regurgitation is trivial. Mild to moderate aortic valve sclerosis/calcification is present, without any evidence of aortic stenosis. Pulmonic Valve: The pulmonic valve was normal in structure. Pulmonic valve regurgitation is not visualized. Aorta: The aortic root is normal in size and structure. Venous: The inferior vena cava is dilated in size with less than 50% respiratory variability, suggesting right atrial pressure  of 15 mmHg. IAS/Shunts: No atrial level shunt detected by color flow Doppler.  LEFT VENTRICLE PLAX 2D LVIDd:         4.90 cm  Diastology LVIDs:         3.40 cm  LV e' lateral: 7.29 cm/s LV PW:         1.10 cm LV IVS:        1.00 cm LVOT diam:     2.00 cm LV SV:         34 LV SV Index:   16 LVOT Area:     3.14 cm  RIGHT VENTRICLE            IVC RV Basal diam:  4.70 cm    IVC diam: 2.70 cm RV Mid diam:    5.10 cm RV S prime:     9.57 cm/s TAPSE (M-mode): 0.6 cm LEFT ATRIUM  Index       RIGHT ATRIUM           Index LA diam:        4.20 cm 1.94 cm/m  RA Area:     23.30 cm LA Vol (A2C):   71.7 ml 33.10 ml/m RA Volume:   75.30 ml  34.77 ml/m LA Vol (A4C):   61.2 ml 28.26 ml/m LA Biplane Vol: 72.4 ml 33.43 ml/m  AORTIC VALVE LVOT Vmax:   74.40 cm/s LVOT Vmean:  51.400 cm/s LVOT VTI:    0.109 m  AORTA Ao Root diam: 3.30 cm TRICUSPID VALVE TR Peak grad:   44.4 mmHg TR Vmax:        333.00 cm/s  SHUNTS Systemic VTI:  0.11 m Systemic Diam: 2.00 cm Gwyndolyn Kaufman MD Electronically signed by Gwyndolyn Kaufman MD Signature Date/Time: 09/03/2020/8:05:33 PM    Final     Cardiac Studies  R/LHC pending  echo   1. There is severe RV enlargement with severely depressed RV systolic  function and moderately elevated PASP. The interventricular septum is  flattened in systole consistent with RV pressure overload. Constellation  of findings highly concerning for  pulmonary embolism. Cardiology team aware and CTA pending.   2. Left ventricular ejection fraction, by estimation, is 55 to 60%. The  left ventricle has normal function. Wall motion difficult to assess due to  poor visualization of the LV endocardium but appears grossly normal. There  is mild concentric left  ventricular hypertrophy. Diastolic function is indeterminant due to atrial  fibrillation. There is the interventricular septum is flattened in  systole, consistent with right ventricular pressure overload.   3. Right ventricular systolic  function is severely reduced. The right  ventricular size is severely enlarged. There is moderately elevated  pulmonary artery systolic pressure. The estimated right ventricular  systolic pressure is 00.3 mmHg.   4. Right atrial size was moderately dilated.   5. The mitral valve is grossly normal. Mild mitral valve regurgitation.   6. Tricuspid valve regurgitation is moderate.   7. The aortic valve is tricuspid. There is moderate calcification of the  aortic valve. There is moderate thickening of the aortic valve. Aortic  valve regurgitation is trivial. Mild to moderate aortic valve  sclerosis/calcification is present, without any   evidence of aortic stenosis.   8. The inferior vena cava is dilated in size with <50% respiratory  variability, suggesting right atrial pressure of 15 mmHg.    Patient Profile     70 y.o. male with progressive dyspnea. 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   Assessment & Plan   Principal Problem:   Acute hypoxemic respiratory failure (HCC) Active Problems:   CHF exacerbation (HCC)   Hyperlipidemia due to type 2 diabetes mellitus (HCC)   Atrial fibrillation with rapid ventricular response (HCC)   Right bundle branch block   Acute hypoxic respiratory failure Right heart failure Pulmonary HTN - d/w with Dr. Haroldine Laws, will plan for [facility] today for further evaluation of right heart failure.  - No PE on CT. Etiologies may include undiagnosed OSA, primary pulmonary HTN, restrictive CM, ischemic HD. - No gross abnormalities of lung parenchyma on independent review. 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  -Cr:1.63 increased 2/2 to contrast yesterday UOP yesterday: -1.47 Weight:  86 kg Net negative for admission: -1.9L Diuretic plan: continue lasix  80 mg IV BID, diuretic plan to be adjusted based on results of RHC.  INFORMED CONSENT: I have reviewed the risks, indications, and alternatives to cardiac catheterization, possible angioplasty, and stenting with the patient. Risks include but are not limited to bleeding, infection, vascular injury, stroke, myocardial infection, arrhythmia, kidney injury, radiation-related injury in the case of prolonged fluoroscopy use, emergency cardiac surgery, and death. The patient understands the risks of serious complication is 1-2 in 6720 with diagnostic cardiac cath and 1-2% or less with angioplasty/stenting.    Afib - overall rate controlled with no additional therapy.       For questions or updates, please contact [place] Please consult [email or web address] for contact info under        Signed, Elouise Munroe, MD  09/04/2020, 9:28 AM

## 2020-09-04 NOTE — Progress Notes (Signed)
Family Medicine Teaching Service Daily Progress Note Intern Pager: [phone]  Patient name: Gary [name] Medical record number: [id number] Date of birth: November 14, 1950 Age: 70 y.o. Gender: male  Primary Care Provider: Jalene Mullet, PA-C Consultants: cardiology Code Status: Full  Pt Overview and Major Events to Date:  4/18: Admitted   Assessment and Plan: Gary [name] is a 70 y.o. male presenting with worsening. PMH is significant for hypertension, DM, depression, history of epistaxis and hyperlipidemia.   Worsening dyspnea  Acute hypoxic respiratory failure  concern for new onset CHF Significantly improved dyspnea, currently breathing comfortably on 13.5 L O2. CXR notable for bibasilar atelectasis, edema or infiltrates are noted with probably small left pleural effusion with mild cardiomegaly noted. Labs notable for troponin 98>101>98. BNP 1202.7. Echo notable for EF 55-60% with mild concentric left ventricular hypertrophy, right atrial size moderately dilated with mild mitral valve regurgitation and moderate tricuspid valve regurgitation. Thus far has had 1.7 L output.  -cardiology following, appreciate recommendations, pending cath -S/p IV lasix 40 mg and 80 mg bid -monitor I/Os -daily weights  - monitor EKG -continuous pulse ox  -wean O2 as tolerated  -consider CTA if persistently tachycardic or symptomatic for PE -up with assistance -cardiac monitoring  -PT/OT eval and treat  New onset atrial fibrillation EKG notable for atrial fibrillation on admission and this morning, possibly secondary to worsening heart failure. CHADS VASc score of 3 and HAS BLED score of 1. TSH wnl. -cardiology consulted, appreciate recommendations  -lovenox 90 mg bid daily -monitor EKG -Continue to monitor rate, rate control as needed  Elevated troponin Likely resolved. Troponin 98>101>98, likely due to demand ischemia.   Hypertension BP 105/78 this morning. Home medications include amlodipine 5 mg  daily and enalapril-HCTZ 10-25 mg daily. -hold home amlodipine -hold hone enalapril-HCTZ given AKI -monitor BP  DM CBG 70. A1c 6.7. Home medications include glipizide 10 mg daily. -hold home meds -sSSI -monitor CBG  AKI in the setting of CKD stage 3a Currently Cr 1.63,baseline appears to be around 1.  Likely due to CHF exacerbation and atrial fibrillation, would expect to improve with diuresis. -monitor am BMP -hold home enalapril/HCTZ  Hyponatremia Na 134, likely secondary to volume overload. Expected to correct with diuresis.  -monitor BMP -will hold off on IVF given concern for possible heart failure -monitor for mental status changes  History of epistaxis Notes history of epistaxis in 2014 that required embolism.  Hyperlipidemia Lipid panel significant for HDL 30. Home medications include atorvastatin 20 mg, although dose is unclear.  -consider restarting statin   Depression Home meds include bupropion 150 mg bid. -continue home med  FEN/GI: heart healthy/carb modified  PPx: lovenox (full dose anticoagulation)   Status is: Observation  The patient remains OBS appropriate and will d/c before 2 midnights.  Dispo: The patient is from: Home              Anticipated d/c is to: Home              Patient currently is not medically stable to d/c.   Difficult to place patient No        Subjective:  No acute overnight events reported. Patient's wife also present at bedside. Patient endorses feeling well today. Denies chest pain and dyspnea.   Objective: Temp:  [97.4 F (36.3 C)-98.4 F (36.9 C)] 98.4 F (36.9 C) (04/19 0400) Pulse Rate:  [40-123] 87 (04/19 0400) Resp:  [19-34] 23 (04/19 0400) BP: (86-128)/(68-104) 104/78 (04/19 0400) SpO2:  [84 %-  100 %] 96 % (04/19 0400) Weight:  [86.3 kg] 86.3 kg (04/19 0400) Physical Exam: General: Patient sitting upright in bed, in no acute distress. Cardiovascular: RRR, no murmurs or gallops auscultated   Respiratory: CTAB, no rales or rhonchi noted, breathing comfortably on 13.5 L O2 Abdomen: soft, nontender, BS+ Extremities: radial and distal pulses strong and equal bilaterally, no LE edema noted bilaterally Psych: mood appropriate, pleasant   Laboratory: Recent Labs  Lab 09/03/20 1040 09/04/20 0414  WBC 8.1 7.9  HGB 14.6 13.4  HCT 44.1 40.8  PLT 323 312   Recent Labs  Lab 09/03/20 1040 09/03/20 1803 09/04/20 0414  NA 134* 137 134*  K 4.5 4.6 4.0  CL 99 101 99  CO2 21* 25 24  BUN 41* 45* 49*  CREATININE 1.46* 1.57* 1.63*  CALCIUM 8.8* 8.7* 8.5*  GLUCOSE 116* 82 75      Imaging/Diagnostic Tests: CT ANGIO CHEST PE W OR WO CONTRAST  Result Date: 09/03/2020 CLINICAL DATA:  RIGHT ventricular dilatation, commas sign, worsening dyspnea, atrial fibrillation, suspected pulmonary embolism; history diabetes mellitus, hyperlipidemia, hypertension EXAM: CT ANGIOGRAPHY CHEST WITH CONTRAST TECHNIQUE: Multidetector CT imaging of the chest was performed using the standard protocol during bolus administration of intravenous contrast. Multiplanar CT image reconstructions and MIPs were obtained to evaluate the vascular anatomy. CONTRAST:  75m OMNIPAQUE IOHEXOL 350 MG/ML SOLN IV COMPARISON:  06/27/2020 FINDINGS: Cardiovascular: Atherosclerotic calcifications aorta, proximal great vessels, and coronary arteries. Ascending thoracic aorta upper normal caliber 3.9 cm diameter. Enlargement of cardiac chambers especially RIGHT atrium and RIGHT ventricle. No pericardial effusion. Pulmonary arteries adequately opacified and patent. No evidence of pulmonary embolism. Enlargement of central pulmonary arteries consistent with pulmonary arterial hypertension. Mediastinum/Nodes: Esophagus unremarkable. Base of cervical region normal appearance. Normal sized RIGHT paratracheal lymph nodes up to 10 mm diameter single enlarged subcarinal node 1.8 cm image 38 previously 1.4 cm. Lungs/Pleura: Small BILATERAL pleural  effusions greater on RIGHT. Patchy infiltrates RIGHT lung favor pneumonia. An area of opacity at the RIGHT middle lobe was present on the previous exam, little changed, 2.7 x 1.2 X 2.5 cm. Minimal dependent atelectasis LEFT lung with LEFT lung otherwise clear. Upper Abdomen: Reflux of contrast into IVC and hepatic veins. Visualized upper abdomen otherwise unremarkable. Musculoskeletal: Degenerative disc disease changes thoracic spine. Review of the MIP images confirms the above findings. IMPRESSION: No evidence of pulmonary embolism. Enlarged central pulmonary arteries consistent with pulmonary arterial hypertension. Associated enlargement of cardiac chambers especially RIGHT ventricle and RIGHT atrium. Small BILATERAL pleural effusions greater on RIGHT. Patchy infiltrates RIGHT lung favor pneumonia. Persistent area of opacity in the RIGHT middle lobe could represent atelectasis, unresolved infiltrate, tumor not excluded; recommend continued follow-up until resolution and if this fails to resolve then consider further assessment by PET-CT. Single enlarged subcarinal lymph node 1.8 cm diameter, previously 1.4 cm, nonspecific. Aortic Atherosclerosis (ICD10-I70.0). Electronically Signed   By: MLavonia DanaM.D.   On: 09/03/2020 20:48   DG Chest Portable 1 View  Result Date: 09/03/2020 CLINICAL DATA:  Dyspnea on exertion. EXAM: PORTABLE CHEST 1 VIEW COMPARISON:  None. FINDINGS: Mild cardiomegaly is noted. No pneumothorax is noted. Bibasilar atelectasis, edema or infiltrates are noted with probable small left pleural effusion. Bony thorax is unremarkable. IMPRESSION: Bibasilar atelectasis, edema or infiltrates are noted with probable small left pleural effusion. Electronically Signed   By: JMarijo ConceptionM.D.   On: 09/03/2020 10:32   ECHOCARDIOGRAM COMPLETE  Result Date: 09/03/2020    ECHOCARDIOGRAM REPORT   Patient  Name:   XZAVIAR MALOOF Date of Exam: 09/03/2020 Medical Rec #:  [id number]   Height:       67.5 in  Accession #:    [id number]  Weight:       232.0 lb Date of Birth:  [birth year]   BSA:          2.166 m Patient Age:    54 years    BP:           98/80 mmHg Patient Gender: M           HR:           89 bpm. Exam Location:  Inpatient Procedure: 2D Echo, Cardiac Doppler and Color Doppler Indications:    I48.91* Unspeicified atrial fibrillation  History:        Patient has no prior history of Echocardiogram examinations.                 Risk Factors:Former Smoker.  Sonographer:    Merrie Roof RDCS Referring Phys: [id number] CARINA M BROWN IMPRESSIONS  1. There is severe RV enlargement with severely depressed RV systolic function and moderately elevated PASP. The interventricular septum is flattened in systole consistent with RV pressure overload. Constellation of findings highly concerning for pulmonary embolism. Cardiology team aware and CTA pending.  2. Left ventricular ejection fraction, by estimation, is 55 to 60%. The left ventricle has normal function. Wall motion difficult to assess due to poor visualization of the LV endocardium but appears grossly normal. There is mild concentric left ventricular hypertrophy. Diastolic function is indeterminant due to atrial fibrillation. There is the interventricular septum is flattened in systole, consistent with right ventricular pressure overload.  3. Right ventricular systolic function is severely reduced. The right ventricular size is severely enlarged. There is moderately elevated pulmonary artery systolic pressure. The estimated right ventricular systolic pressure is 16.8 mmHg.  4. Right atrial size was moderately dilated.  5. The mitral valve is grossly normal. Mild mitral valve regurgitation.  6. Tricuspid valve regurgitation is moderate.  7. The aortic valve is tricuspid. There is moderate calcification of the aortic valve. There is moderate thickening of the aortic valve. Aortic valve regurgitation is trivial. Mild to moderate aortic valve sclerosis/calcification is  present, without any  evidence of aortic stenosis.  8. The inferior vena cava is dilated in size with <50% respiratory variability, suggesting right atrial pressure of 15 mmHg. Comparison(s): No prior Echocardiogram. FINDINGS  Left Ventricle: Left ventricular ejection fraction, by estimation, is 55 to 60%. The left ventricle has normal function. Wall motion diffiicult to assess due to poor visualization of LV endocardium but appears grossly normal. The left ventricular internal cavity size was normal in size. There is mild concentric left ventricular hypertrophy. The interventricular septum is flattened in systole, consistent with right ventricular pressure overload. Diastolic function is indeterminant due to atrial fibrillation. Right Ventricle: The right ventricular size is severely enlarged. Right vetricular wall thickness was not assessed. Right ventricular systolic function is severely reduced. There is moderately elevated pulmonary artery systolic pressure. The tricuspid regurgitant velocity is 3.33 m/s, and with an assumed right atrial pressure of 15 mmHg, the estimated right ventricular systolic pressure is 37.2 mmHg. Left Atrium: Left atrial size was normal in size. Right Atrium: Right atrial size was moderately dilated. Pericardium: There is no evidence of pericardial effusion. Presence of pericardial fat pad. Mitral Valve: The mitral valve is grossly normal. There is mild thickening of the mitral valve leaflet(s). There is mild calcification  of the mitral valve leaflet(s). Mild mitral annular calcification. Mild mitral valve regurgitation. Tricuspid Valve: The tricuspid valve is normal in structure. Tricuspid valve regurgitation is moderate. Aortic Valve: The aortic valve is tricuspid. There is moderate calcification of the aortic valve. There is moderate thickening of the aortic valve. Aortic valve regurgitation is trivial. Mild to moderate aortic valve sclerosis/calcification is present, without any  evidence of aortic stenosis. Pulmonic Valve: The pulmonic valve was normal in structure. Pulmonic valve regurgitation is not visualized. Aorta: The aortic root is normal in size and structure. Venous: The inferior vena cava is dilated in size with less than 50% respiratory variability, suggesting right atrial pressure of 15 mmHg. IAS/Shunts: No atrial level shunt detected by color flow Doppler.  LEFT VENTRICLE PLAX 2D LVIDd:         4.90 cm  Diastology LVIDs:         3.40 cm  LV e' lateral: 7.29 cm/s LV PW:         1.10 cm LV IVS:        1.00 cm LVOT diam:     2.00 cm LV SV:         34 LV SV Index:   16 LVOT Area:     3.14 cm  RIGHT VENTRICLE            IVC RV Basal diam:  4.70 cm    IVC diam: 2.70 cm RV Mid diam:    5.10 cm RV S prime:     9.57 cm/s TAPSE (M-mode): 0.6 cm LEFT ATRIUM             Index       RIGHT ATRIUM           Index LA diam:        4.20 cm 1.94 cm/m  RA Area:     23.30 cm LA Vol (A2C):   71.7 ml 33.10 ml/m RA Volume:   75.30 ml  34.77 ml/m LA Vol (A4C):   61.2 ml 28.26 ml/m LA Biplane Vol: 72.4 ml 33.43 ml/m  AORTIC VALVE LVOT Vmax:   74.40 cm/s LVOT Vmean:  51.400 cm/s LVOT VTI:    0.109 m  AORTA Ao Root diam: 3.30 cm TRICUSPID VALVE TR Peak grad:   44.4 mmHg TR Vmax:        333.00 cm/s  SHUNTS Systemic VTI:  0.11 m Systemic Diam: 2.00 cm Gwyndolyn Kaufman MD Electronically signed by Gwyndolyn Kaufman MD Signature Date/Time: 09/03/2020/8:05:33 PM    Final     Donney Dice, DO 09/04/2020, 6:45 AM PGY-1, [facility] Intern pager: [phone], text pages welcome

## 2020-09-05 ENCOUNTER — Inpatient Hospital Stay (HOSPITAL_COMMUNITY): Payer: Medicare Other

## 2020-09-05 ENCOUNTER — Encounter (HOSPITAL_COMMUNITY): Payer: Self-pay | Admitting: Family Medicine

## 2020-09-05 DIAGNOSIS — I2721 Secondary pulmonary arterial hypertension: Secondary | ICD-10-CM | POA: Diagnosis not present

## 2020-09-05 DIAGNOSIS — I4510 Unspecified right bundle-branch block: Secondary | ICD-10-CM | POA: Diagnosis not present

## 2020-09-05 DIAGNOSIS — I50813 Acute on chronic right heart failure: Secondary | ICD-10-CM | POA: Diagnosis not present

## 2020-09-05 DIAGNOSIS — I482 Chronic atrial fibrillation, unspecified: Secondary | ICD-10-CM | POA: Diagnosis not present

## 2020-09-05 DIAGNOSIS — I2729 Other secondary pulmonary hypertension: Secondary | ICD-10-CM

## 2020-09-05 DIAGNOSIS — I50810 Right heart failure, unspecified: Secondary | ICD-10-CM | POA: Diagnosis not present

## 2020-09-05 DIAGNOSIS — J9601 Acute respiratory failure with hypoxia: Secondary | ICD-10-CM | POA: Diagnosis not present

## 2020-09-05 LAB — BASIC METABOLIC PANEL
Anion gap: 8 (ref 5–15)
BUN: 79 mg/dL — ABNORMAL HIGH (ref 8–23)
CO2: 26 mmol/L (ref 22–32)
Calcium: 8 mg/dL — ABNORMAL LOW (ref 8.9–10.3)
Chloride: 99 mmol/L (ref 98–111)
Creatinine, Ser: 1.27 mg/dL — ABNORMAL HIGH (ref 0.61–1.24)
GFR, Estimated: >60 mL/min (ref >60)
Glucose, Bld: 116 mg/dL — ABNORMAL HIGH (ref 70–99)
Potassium: 4.2 mmol/L (ref 3.5–5.1)
Sodium: 133 mmol/L — ABNORMAL LOW (ref 135–145)

## 2020-09-05 LAB — CBC
HCT: 35.7 % — ABNORMAL LOW (ref 39.0–52.0)
Hemoglobin: 11.6 g/dL — ABNORMAL LOW (ref 13.0–17.0)
MCH: 30.7 pg (ref 26.0–34.0)
MCHC: 32.5 g/dL (ref 30.0–36.0)
MCV: 94.4 fL (ref 80.0–100.0)
Platelets: 297 10*3/uL (ref 150–400)
RBC: 3.78 MIL/uL — ABNORMAL LOW (ref 4.22–5.81)
RDW: 13.9 % (ref 11.5–15.5)
WBC: 8.2 10*3/uL (ref 4.0–10.5)
nRBC: 0 % (ref 0.0–0.2)

## 2020-09-05 LAB — RESPIRATORY PANEL BY PCR

## 2020-09-05 LAB — GLUCOSE, CAPILLARY
Glucose-Capillary: 117 mg/dL — ABNORMAL HIGH (ref 70–99)
Glucose-Capillary: 118 mg/dL — ABNORMAL HIGH (ref 70–99)
Glucose-Capillary: 195 mg/dL — ABNORMAL HIGH (ref 70–99)
Glucose-Capillary: 192 mg/dL — ABNORMAL HIGH (ref 70–99)

## 2020-09-05 LAB — HIV ANTIBODY (ROUTINE TESTING W REFLEX): HIV Screen 4th Generation wRfx: NONREACTIVE

## 2020-09-05 LAB — C-REACTIVE PROTEIN: CRP: 3.2 mg/dL — ABNORMAL HIGH (ref <1.0)

## 2020-09-05 LAB — SEDIMENTATION RATE: Sed Rate: 44 mm/hr — ABNORMAL HIGH (ref 0–16)

## 2020-09-05 MED ORDER — APIXABAN 5 MG PO TABS
5.0000 mg | ORAL_TABLET | Freq: Two times a day (BID) | ORAL | Status: DC
  Administered 2020-09-05 – 2020-09-06 (×3): 5 mg via ORAL
  Filled 2020-09-05 (×3): qty 1

## 2020-09-05 MED ORDER — ASPIRIN 81 MG PO CHEW
81.0000 mg | CHEWABLE_TABLET | Freq: Every day | ORAL | Status: DC
  Administered 2020-09-05: 81 mg via ORAL
  Filled 2020-09-05: qty 1

## 2020-09-05 NOTE — Progress Notes (Signed)
Went to bedside to check on patient.  No concerns at this time, denies shortness of breath while on oxygen.  At this time, he is breathing comfortably on 13 L NRB with SpO2 100% on the monitor.  Heart rate in the 110s on monitor.  No change to current plan.

## 2020-09-05 NOTE — Hospital Course (Addendum)
Gary [name] is a 70 y.o. male presenting with worsening dypsnea. PMH is significant for hypertension, DM, depression, history of epistaxis (requiring IR-guided embolization) and hyperlipidemia.   Acute hypoxemic respiratory failure secondary to [facility] with cor pulmonale Presented to the ED with worsening dyspnea, tachypneic at 24 and tachycardic to 123.  Initially satting at 77% on room air, required 15 L nonrebreather.  No previous home oxygen requirement.  CXR notable for bibasilar atelectasis, edema or infiltrates noted with probable small left pleural effusion and mild cardiomegaly. Labs notable for troponin 98. EKG notable for atrial fibrillation without notable ST changes. In the ED, given IV lasix 40 mg once along with metoprolol tartrate. BNP 1202.7. Echo with EF 55-60% with mild concentric left ventricular hypertrophy, right atrial size moderately dilated with mild mitral valve regurgitation and moderate tricuspid valve regurgitation. 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  Patient was treated with diuresis.  Transferred to ICU on 4/22 due to need for intubation to undergo EGD (see below). In ICU, sputum culture grew Strep pneumo, completed 5 day course of ceftriaxone. Required pressor support while in the ICU (dobutamine, norepinephrine, vasopressin, milrinone).  He was also started epoprostenol for RV support which was weaned off prior to extubation. Extubated on 4/26 onto HFNC up to 40L FiO2 100%.  He was treated with sildenafil and bronchodilators.  He was continued on midodrine 10 mg 3 times daily for BP support.  Repeat echo performed and showed EF 55-60%, moderately elevated pulmonary artery systolic pressure, severe right ventricular enlargement, flattened interventricular septum concerning for PE. CTA negative. Given continued high oxygen support, patient had bedside  ultrasound which demonstrated pleural effusions.  Thoracentesis was performed and drained 1200 cc of fluid.  Fluid appeared exudative but fluid analysis was negative for triglycerides, malignancy, infection. 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  Patient had positive chromatin antibody, though further autoimmune work-up was negative.  His need for continued high flow oxygen was unclear and he was trialed on pulse dose steroids with only mild improvement.  Unable to perform bronchoscopy given high oxygen requirement.  Patient had a repeat chest x-ray 5/7 which showed small right pleural effusion and otherwise stable spiculated mass seen previously.  He received extensive diuresis during hospitalization without significant change in respiratory status. Was started on daily Torsemide 20 mg to maintain euvolemia. Right heart cath was repeated on 5/9 and showed only mild PAH with normal to increased cardiac output without intracardiac shunting. Echo with bubble study done to evaluate for large peripheral shunt and was negative.  Patient was trialed on CPAP the night of 5/10, and had drastic improvement the following day-was able to be titrated to 5 L nasal cannula. Worked with PT and able to maintain stable oxygen saturations on nasal cannula.   Upper GI bleed Hgb dropped to 6.7 on 4/22 in the setting of starting anticoagulation likely secondary to active GI bleed with noted melena. FOBT positive. Anticoagulation held. CT abdomen/pelvis demonstrated no signs of retroperitoneal hematoma. Hgb continued to drop, so patient was admitted to ICU so he could be intubated to undergo EGD, with finding of two non-bleeding duodenal ulcers.  He was started on twice daily PPI.  Extubated 4/26. Total 6 days ICU care.  Triple therapy for age pylori with started 5/2.  Hgb remained stable for the remainder of admission, even after  reinitiation of  Eliquis. Hgb on day of discharge 9.9. Received total of 3 units of blood during entire admission.   H. pylori infection Positive H. pylori on EGD biopsy. Treated with triple therapy starting 5/2 with Protonix, Amoxicillin and Clarithromycin. Finished on 5/15.   New onset atrial fibrillation EKG notable for atrial fibrillation on admission, possibly secondary to worsening heart failure. CHADS VASc score of 3 and HAS BLED score of 1.  Initial heart rate elevated in the 110-115 range, treated with metoprolol tartrate 2.5 mg in the ED. Cardiology consulted, planned for TEE and cardioversion 4/22; however this was delayed due to GI bleed. Due to hypotension, he was started on amiodarone gtt eventually transitioned to PO amiodarone on 5/1. He fluctuated between NSR and atrial flutter/A-fib/A-fib with RVR. Anticoagulation held during hospitalization after GI bleed, restarted on 5/12 and patient remained stable without concern for rebleed.  During his bout of RVR, he was started on diltiazem which controlled rate adequately.   Elevated troponin Troponin 98 on admission, likely due to demand ischemia.  Patient without complaints of chest pain.  EKG with some T wave inversions in the lateral leads, no previous EKG to compare to.  Other chronic conditions stable during admission.  Issues for follow up: Will need outpatient PFTs and sleep study per cardiology. Discharged with 5L O2. Will need follow up CT to follow up on right lung mass and persistent nodule consolidation within the right muddle lobe where underlying neoplasm cannot be ruled out.  Consider starting statin Took off of aspirin while on anticoagulation.  Urea breath test for h. Pylori test of cure should be done after 6/12 (4 weeks after completion of treatment).  Consider starting Trelegy outpatient.

## 2020-09-05 NOTE — Evaluation (Signed)
Occupational Therapy Evaluation Patient Details Name: Gary [name] MRN: [id number] DOB: 03/14/1951 Today's Date: 09/05/2020    History of Present Illness Patient is a 70 y/o male who presents on 09/03/20 with progressive SOB. Found to have acute respiratory failure likely due to new onset of heart failure and A-fib with RVR. CXR- Rt effusion, prominent interstitial edema/disease. s/p cardiac cath 09/04/20. PMh includes HTN and DM.   Clinical Impression   Patient admitted for the above diagnosis.  PTA he was independent with all care, mobility, community mobility, meds, IADL, and cared for 10 dogs.  Patient lives with his wife, who is able to provide assist as needed in the home setting.  Barriers are listed below.  Currently he is needing setup for all ADL in the acute setting due to HFNC and O2 needs, and is completing in room mobility without an AD, but does reach for objects in his environment.  He continues to de-sat with activity, and needs reinforcement with PLB and therapeutic rest breaks.  Patient is not interested in OT in the home environment.  He verbalizes understanding of energy conservation techniques, and patient encouraged to continue mobility with staff.      Follow Up Recommendations  No OT follow up    Equipment Recommendations  None recommended by OT    Recommendations for Other Services       Precautions / Restrictions Precautions Precaution Comments: watch 02 (on high flow), HR Restrictions Weight Bearing Restrictions: No      Mobility Bed Mobility Overal bed mobility: Modified Independent Bed Mobility: Supine to Sit;Sit to Supine     Supine to sit: Modified independent (Device/Increase time);HOB elevated Sit to supine: Modified independent (Device/Increase time);HOB elevated     Patient Response: Cooperative  Transfers Overall transfer level: Needs assistance   Transfers: Sit to/from Stand Sit to Stand: Modified independent (Device/Increase time)               Balance Overall balance assessment: Needs assistance Sitting-balance support: Feet supported;No upper extremity supported Sitting balance-Leahy Scale: Good     Standing balance support: Single extremity supported Standing balance-Leahy Scale: Fair                             ADL either performed or assessed with clinical judgement   ADL Overall ADL's : Modified independent                                       General ADL Comments: patient able to complete all ADL and toleting without assist.  Patient walked to BR without AD.  Did reach for objects in his environment, and continues to desat.  VC's for EC/WS.     Vision Baseline Vision/History: Wears glasses Wears Glasses: At all times Patient Visual Report: No change from baseline       Perception     Praxis      Pertinent Vitals/Pain Pain Assessment: No/denies pain     Hand Dominance Right   Extremity/Trunk Assessment Upper Extremity Assessment Upper Extremity Assessment: Overall WFL for tasks assessed   Lower Extremity Assessment Lower Extremity Assessment: Defer to PT evaluation   Cervical / Trunk Assessment Cervical / Trunk Assessment: Normal   Communication Communication Communication: No difficulties   Cognition Arousal/Alertness: Awake/alert Behavior During Therapy: WFL for tasks assessed/performed Overall Cognitive Status: Within Functional Limits for tasks assessed  Home Living Family/patient expects to be discharged to:: Private residence Living Arrangements: Spouse/significant other Available Help at Discharge: Family;Available PRN/intermittently Type of Home: House Home Access: Stairs to enter [employer] of Steps: 2 Entrance Stairs-Rails: None Home Layout: One level     Bathroom Shower/Tub: Occupational psychologist: Standard     Home Equipment: Shower  seat          Prior Functioning/Environment Level of Independence: Independent        Comments: Patient reports independent with ADLs, IADLs, cooking/cleaning. Cares for 10 dogs at home. Retired [employer] administrator.        OT Problem List: Decreased activity tolerance;Impaired balance (sitting and/or standing)      OT Treatment/Interventions:      OT Goals(Current goals can be found in the care plan section) Acute Rehab OT Goals Patient Stated Goal: return to independence, be able to breathe OT Goal Formulation: With patient Potential to Achieve Goals: Good  OT Frequency:     Barriers to D/C:  none noted          Co-evaluation              AM-PAC OT "6 Clicks" Daily Activity     Outcome Measure Help from another person eating meals?: None Help from another person taking care of personal grooming?: None Help from another person toileting, which includes using toliet, bedpan, or urinal?: None Help from another person bathing (including washing, rinsing, drying)?: None Help from another person to put on and taking off regular upper body clothing?: None Help from another person to put on and taking off regular lower body clothing?: None 6 Click Score: 24   End of Session Equipment Utilized During Treatment: Oxygen Nurse Communication: Mobility status  Activity Tolerance: Patient tolerated treatment well Patient left: in bed;with call bell/phone within reach;with family/visitor present  OT Visit Diagnosis: Unsteadiness on feet (R26.81)                Time: 9292-4462 OT Time Calculation (min): 18 min Charges:  OT General Charges $OT Visit: 1 Visit OT Evaluation $OT Eval Moderate Complexity: 1 Mod  09/05/2020  Rich, OTR/L  Acute Rehabilitation Services  Office:  [phone]   Metta Clines 09/05/2020, 12:45 PM

## 2020-09-05 NOTE — Progress Notes (Addendum)
Advanced Heart Failure Rounding Note  PCP-Cardiologist: No primary care provider on file.   Subjective:    RHC yesterday w/ normal PCWP, 13. Diuretics held. Wt up 1 lb.  SCr down 1.63>>1.27   BUN higher 49>>79    Remains in rapid afib, V-rates low 100s-110s. BP ok    Complains of dry cough. On HFNC 15L/min    Objective:   Weight Range: 86.9 kg Body mass index is 29.99 kg/m.   Vital Signs:   Temp:  [98.1 F (36.7 C)-98.3 F (36.8 C)] 98.3 F (36.8 C) (04/20 0732) Pulse Rate:  [34-123] 69 (04/20 0732) Resp:  [20-31] 21 (04/20 0732) BP: (96-116)/(56-97) 111/57 (04/20 0732) SpO2:  [84 %-96 %] 95 % (04/20 0732) Weight:  [86.9 kg] 86.9 kg (04/20 0308) Last BM Date: 09/04/20  Weight change: Filed Weights   09/04/20 0400 09/05/20 0308  Weight: 86.3 kg 86.9 kg    Intake/Output:   Intake/Output Summary (Last 24 hours) at 09/05/2020 1237 Last data filed at 09/05/2020 0823 Gross per 24 hour  Intake 603 ml  Output 1100 ml  Net -497 ml      Physical Exam    General:  Well appearing WM sitting up in bed on HFNC  HEENT: Normal Neck: Supple. JVP 10 cm . Carotids 2+ bilat; no bruits. No lymphadenopathy or thyromegaly appreciated. Cor: PMI nondisplaced. Irregularly irregular rhythm, tachy rate. 2/6 TR murmur  Lungs: decreased BS at the bases, no wheezing  Abdomen: Soft, nontender, nondistended. No hepatosplenomegaly. No bruits or masses. Good bowel sounds. Extremities: No cyanosis, clubbing, rash, edema Neuro: Alert & orientedx3, cranial nerves grossly intact. moves all 4 extremities w/o difficulty. Affect pleasant   Telemetry   Afib w/ RVR low 100s-110s   EKG    No new EKG to review today   Labs    CBC Recent Labs    09/04/20 0414 09/04/20 1054 09/04/20 1118 09/05/20 0413  WBC 7.9  --   --  8.2  HGB 13.4   < > 12.9* 11.6*  HCT 40.8   < > 38.0* 35.7*  MCV 93.8  --   --  94.4  PLT 312  --   --  297   < > = values in this interval not displayed.    Basic Metabolic Panel Recent Labs    09/04/20 0414 09/04/20 1054 09/04/20 1118 09/05/20 0413  NA 134*   < > 137 133*  K 4.0   < > 3.9 4.2  CL 99  --   --  99  CO2 24  --   --  26  GLUCOSE 75  --   --  116*  BUN 49*  --   --  79*  CREATININE 1.63*  --   --  1.27*  CALCIUM 8.5*  --   --  8.0*  MG 1.8  --   --   --    < > = values in this interval not displayed.   Liver Function Tests No results for input(s): AST, ALT, ALKPHOS, BILITOT, PROT, ALBUMIN in the last 72 hours. No results for input(s): LIPASE, AMYLASE in the last 72 hours. Cardiac Enzymes No results for input(s): CKTOTAL, CKMB, CKMBINDEX, TROPONINI in the last 72 hours.  BNP: BNP (last 3 results) Recent Labs    09/03/20 1433  BNP 1,202.7*    ProBNP (last 3 results) No results for input(s): PROBNP in the last 8760 hours.   D-Dimer No results for input(s): DDIMER in the last  72 hours. Hemoglobin A1C Recent Labs    09/03/20 1452  HGBA1C 6.7*   Fasting Lipid Panel Recent Labs    09/04/20 0414  CHOL 64  HDL 30*  LDLCALC 20  TRIG 68  CHOLHDL 2.1   Thyroid Function Tests Recent Labs    09/03/20 1803  TSH 2.514    Other results:   Imaging    DG Chest 2 View  Result Date: 09/05/2020 CLINICAL DATA:  Acute respiratory failure. EXAM: CHEST - 2 VIEW COMPARISON:  CT 09/03/2020.  Chest x-ray 09/03/2020. FINDINGS: Stable cardiomegaly. Bilateral pulmonary infiltrates/edema again noted. No interim change from prior studies. Reference is made to prior CT report of 09/03/2020. No pleural effusion or pneumothorax. Degenerative change thoracic spine. IMPRESSION: 1. Bilateral pulmonary infiltrates/again noted. No interim change from prior studies. Reference is made to prior CT report of 09/03/2020. 2. Stable cardiomegaly. Electronically Signed   By: Marcello Moores  Register   On: 09/05/2020 06:20      Medications:     Scheduled Medications: . apixaban  5 mg Oral BID  . aspirin  81 mg Oral Daily  .  atorvastatin  40 mg Oral Daily  . buPROPion  150 mg Oral BID  . insulin aspart  0-9 Units Subcutaneous TID WC  . sodium chloride flush  3 mL Intravenous Q12H  . sodium chloride flush  3 mL Intravenous Q12H     Infusions: . sodium chloride    . sodium chloride       PRN Medications:  sodium chloride, sodium chloride, acetaminophen, ondansetron (ZOFRAN) IV, sodium chloride flush, sodium chloride flush    Assessment/Plan   1. PAH with cor pulmonale - CT chest 4/22: No PE or ILD - Echo LVEF 60% severe RV dilation and HK - Cath with moderate PAH. PA = 66/31 (46) PCW = 13 Fick = 4.1/2.1 PVR = 7.8 WU - Suspect this is WHO Group 3 PAH due to hypoxic lung disease/OHS/OSA - Will order PFTs (when better compensated) and will need outpatient sleep study - Check serologies for completeness sake>> in process  - Will need home O2 - Refer to Pulmonary Rehab - Stressed need for weight loss - he will likely need daily diuretic once discharged, torsemide 20 mg daily but plan to hold today given normal PWP on cath yesterday and rising BUN.   2. Paroxysmal AF - new onset. Unclear duration  - Switch to Eliquis today and plan DC-CV in 3-4 weeks if tolerated/rate controlled - otherwise will need TEE/DC-CV - May need AAD as wil be high risk for re-occurrence   3. CAD  -mostly non-obstructive - atorva 40 added  - No ASA with AC  4. R lung mass - will need CT f/u     Length of Stay: 1  Gary Jester, PA-C  09/05/2020, 12:37 PM  Advanced Heart Failure Team Pager [phone] (M-F; 7a - 5p)  Please contact [facility] for night-coverage after hours (5p -7a ) and weekends on [email or web address]  Patient seen and examined with the above-signed Advanced Practice Provider and/or Housestaff. I personally reviewed laboratory data, imaging studies and relevant notes. I independently examined the patient and formulated the important aspects of the plan. I have edited the note to reflect any of my  changes or salient points. I have personally discussed the plan with the patient and/or family.  Says breathing is better but still looks dyspneic. Says he walked with OT and sats did not drop. Remains in AF with RVR.   General: Lying  in bed. Mildly dyspneic HEENT: normal Neck: supple. no JVD. Carotids 2+ bilat; no bruits. No lymphadenopathy or thryomegaly appreciated. Cor: PMI nondisplaced. irreg tachy 2/6 TR Lungs: basilar crackles Abdomen:  Obese  soft, nontender, nondistended. No hepatosplenomegaly. No bruits or masses. Good bowel sounds. Extremities: no cyanosis, clubbing, rash, edema Neuro: alert & orientedx3, cranial nerves grossly intact. moves all 4 extremities w/o difficulty. Affect pleasant  Cath results reviewed with him and his wife. Given low PCWP I do not think we have any further room to diurese. I do think his AF may be driving some of his symptoms. Will switch to Eliquis and plan TEE & DC-CV on Friday at 130pm. Will d/w Pulmonary about any thoughts to improve respiratory status prior to TEE.  PAH likely WHO Group 3. Will need outpatients PFTs and sleep study.   Gary Bickers, MD  4:17 PM

## 2020-09-05 NOTE — Progress Notes (Addendum)
Family Medicine Teaching Service Daily Progress Note Intern Pager: [phone]  Patient name: Gary [name] Medical record number: [id number] Date of birth: [birth year] Age: 70 y.o. Gender: male  Primary Care Provider: Jalene Mullet, PA-C Consultants: cardiology, pulmonology Code Status: Full   Pt Overview and Major Events to Date:  4/18: Admitted   Assessment and Plan: Gary [name] a 70 y.o.malepresenting with worsening dypsnea. PMH is significant forhypertension, DM, depression, history of epistaxisand hyperlipidemia.   Worsening dyspnea Acute hypoxic respiratory failureconcern for new onset CHF Significantly improved dyspnea, currently breathing comfortably on 15 L O2. Noted to have better respiratory status with exertion although felt short of breath when he was getting up off the toilet. CXR notable for bibasilar atelectasis, edema or infiltrates are noted with probably small left pleural effusion with mild cardiomegaly noted.Labs notable for troponin98>101>98. BNP 1202.7. Echo notable for EF 55-60% with mild concentric left ventricular hypertrophy, right atrial size moderately dilated with mild mitral valve regurgitation and moderate tricuspid valve regurgitation. 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 -cardiology following, appreciate recommendations and continued involvement  -cath and coronary angio performed, CAD will be managed medically -heart failure team following, appreciate continued involvement  -S/pIV lasix 40 mg and 80 mg bid, will consider contacting cardiology for further possible oral diuresis  -pending orthostatic vitals  -monitor I/Os -daily weights  - monitor EKG -continuous pulse ox  -wean O2 as tolerated -consider CTA if persistently tachycardic or symptomatic for PE -up with assistance -cardiac monitoring -PT/OT eval and  treat  New onset atrial fibrillation EKG notable for atrial fibrillationon admission and this morning, possibly secondary to worsening heart failure.CHADS VASc score of 3 and HAS BLED score of 1.TSH wnl. -cardiology consulted, appreciate continued involvement and recommendations -lovenox 90 mg bid daily, plan to switch to eliquis per cardiology -monitor EKG -Continue to monitor rate, rate control as needed -outpatient cardioversion 3-4 weeks following discharge   Pulmonary hypertension  Cor pulmonale  Noted on most recent echo. -Pulmonology consulted, recommended continued diuresis and then sildenafil therapy -will need PFTs and sleep study outpatient per pulmonology  -outpatient pulmonology follow up with Dr. Silas [name]  Hypertension BP 104/77 this morning. Home medications include amlodipine 5 mg daily and enalapril-HCTZ 10-25 mg daily. -holdhome amlodipine -hold home enalapril-HCTZ given AKI -monitor BP  DM CBG 117. A1c 6.7. Home medications include glipizide 10 mg daily. -hold home meds -sSSI -monitor CBG  AKIin the setting ofCKD stage 3a Improved. Cr 1.27, baseline appears to be around 1.Likely due to CHF exacerbation and atrial fibrillation, would expect to improve with diuresis. -monitor BMP -hold home enalapril/HCTZ  Hyponatremia Na 133, likely secondary to volume overload. Expected to correct with diuresis. -monitor BMP -will hold off on IVF given concern for possible heart failure -monitor for mental status changes  History of epistaxis Notes history of epistaxis in 2014 that required embolism. -no BiPAP  Hyperlipidemia Lipid panel significant for HDL 30. Home medications include atorvastatin 40 mg.  -restart home atorvastatin 40 mg daily   Depression Home meds include bupropion 150 mg bid. -continue home med  FEN/GI: heart healthy PPx: lovenox (full dose anticoagulation)   Status is: Inpatient  Remains inpatient appropriate  because:Inpatient level of care appropriate due to severity of illness   Dispo: The patient is from: Home              Anticipated d/c is to: Home  Patient currently is not medically stable to d/c.   Difficult to place patient No        Subjective:  No acute overnight events. Patient denies dyspnea and chest pain although experienced dyspnea he got up from the toilet. But none when he was walking to and from the bathroom. Otherwise states that he is feeling fine, wife is at bedside and they share that they are in no rush to go home.   Objective: Temp:  [98.1 F (36.7 C)-98.3 F (36.8 C)] 98.2 F (36.8 C) (04/20 0308) Pulse Rate:  [34-130] 93 (04/20 0308) Resp:  [17-31] 22 (04/20 0308) BP: (94-119)/(56-97) 104/77 (04/20 0308) SpO2:  [0 %-100 %] 91 % (04/20 0308) Weight:  [86.9 kg] 86.9 kg (04/20 0308) Physical Exam: General: Patient sitting on the side of the bed, in no acute distress. Cardiovascular: irregularly irregular, no murmurs or gallops auscultated  Respiratory: CTAB, no rales or rhonchi noted Abdomen: soft, nontender, BS+ Extremities: radial and distal pulses strong and equal bilaterally, no LE edema noted bilaterally Neuro: appropriately conversational  Psych: mood appropriate, very pleasant   Laboratory: Recent Labs  Lab 09/03/20 1040 09/04/20 0414 09/04/20 1054 09/04/20 1100 09/04/20 1118 09/05/20 0413  WBC 8.1 7.9  --   --   --  8.2  HGB 14.6 13.4   < > 13.9  12.9* 12.9* 11.6*  HCT 44.1 40.8   < > 41.0  38.0* 38.0* 35.7*  PLT 323 312  --   --   --  297   < > = values in this interval not displayed.   Recent Labs  Lab 09/03/20 1803 09/04/20 0414 09/04/20 1054 09/04/20 1100 09/04/20 1118 09/05/20 0413  NA 137 134*   < > 137  139 137 133*  K 4.6 4.0   < > 4.1  3.7 3.9 4.2  CL 101 99  --   --   --  99  CO2 25 24  --   --   --  26  BUN 45* 49*  --   --   --  79*  CREATININE 1.57* 1.63*  --   --   --  1.27*  CALCIUM 8.7* 8.5*  --    --   --  8.0*  GLUCOSE 82 75  --   --   --  116*   < > = values in this interval not displayed.      Imaging/Diagnostic Tests: DG Chest 2 View  Result Date: 09/05/2020 CLINICAL DATA:  Acute respiratory failure. EXAM: CHEST - 2 VIEW COMPARISON:  CT 09/03/2020.  Chest x-ray 09/03/2020. FINDINGS: Stable cardiomegaly. Bilateral pulmonary infiltrates/edema again noted. No interim change from prior studies. Reference is made to prior CT report of 09/03/2020. No pleural effusion or pneumothorax. Degenerative change thoracic spine. IMPRESSION: 1. Bilateral pulmonary infiltrates/again noted. No interim change from prior studies. Reference is made to prior CT report of 09/03/2020. 2. Stable cardiomegaly. Electronically Signed   By: Marcello Moores  Register   On: 09/05/2020 06:20   CARDIAC CATHETERIZATION  Result Date: 09/04/2020  Dist RCA lesion is 100% stenosed.  Ost LAD to Mid LAD lesion is 20% stenosed.  Mid LAD lesion is 40% stenosed.  Prox RCA lesion is 40% stenosed.  Findings: Ao = 95/60 (76) LV = 95/14 RA = 12 RV = 60/13 PA = 66/31 (46) PCW = 13 Fick cardiac output/index = 4.1/2.1 PVR = 7.8 WU Ao sat = 99% PA sat =63%, 65% Assessment: 1. It appears that the distal  RCA is chronically occluded with otherwise non-obstructive CAD. There is a large RV marginal branch that is widely patent 2. EF 60-65% 3. Moderate PAH with elevated PVR and moderately reduced cardiac output Plan/Discussion: Will manage CAD medically. Will need w/u for PAH and cor pulmonale . Glori Bickers, MD 11:51 AM    Donney Dice, DO 09/05/2020, 6:43 AM PGY-1, [place] Intern pager: [phone], text pages welcome

## 2020-09-05 NOTE — Progress Notes (Signed)
Physical Therapy Treatment Patient Details Name: Gary [name] MRN: [id number] DOB: Apr 23, 1951 Today's Date: 09/05/2020    History of Present Illness Patient is a 70 y/o male who presents on 09/03/20 with progressive SOB. Found to have acute respiratory failure likely due to new onset of heart failure and A-fib with RVR as well as new R lung mass. CXR- Rt effusion, prominent interstitial edema/disease. s/p cardiac cath 09/04/20. PMh includes HTN and DM.    PT Comments    Pt received seated EOB, agreeable to therapy session and with good participation and fair tolerance for mobility. Pt SpO2 86-94% on 15L HF O2 [place] (desat initially with transfers but mostly 88-92% with standing tasks on 15L after pursed-lip breathing instruction) with mobility tasks but unable to progress gait due to HR elevated up to 149 bpm with standing exercises at bedside (pt asymptomatic aside from increased WOB due to respiratory status).  Pt given seated/supine HEP handout and performed with good tolerance, encouraged him to continue bed-level exercises on his own but will need staff assist for standing exercise/mobility progression due to respiratory status and Afib. Pt very motivated to do all he can to progress mobility. Pt continues to benefit from PT services to progress toward functional mobility goals. Continue to recommend DC home, pending progress/medical stability.  Follow Up Recommendations  No PT follow up;Supervision - Intermittent (pending progression)     Equipment Recommendations  None recommended by PT    Recommendations for Other Services       Precautions / Restrictions Precautions Precautions: Other (comment) Precaution Comments: watch 02 (on high flow), HR Restrictions Weight Bearing Restrictions: No    Mobility  Bed Mobility               General bed mobility comments: received seated EOB    Transfers Overall transfer level: Needs assistance   Transfers: Sit to/from Stand Sit to  Stand: Modified independent (Device/Increase time)         General transfer comment: from EOB, no physical assist needed          Balance Overall balance assessment: Needs assistance Sitting-balance support: Feet supported;No upper extremity supported Sitting balance-Leahy Scale: Good     Standing balance support: Single extremity supported Standing balance-Leahy Scale: Fair Standing balance comment: used RW for standing exercises/single leg standing but able to stand at EOB unsupported without LOB initially      Cognition Arousal/Alertness: Awake/alert Behavior During Therapy: WFL for tasks assessed/performed Overall Cognitive Status: Within Functional Limits for tasks assessed                General Comments: pleasant, participatory      Exercises General Exercises - Lower Extremity Ankle Circles/Pumps: AROM;Both;10 reps;Seated Long Arc Quad: AROM;Both;15 reps;Seated Hip Flexion/Marching: AROM;Both;Seated;Standing;20 reps (x10 reps ea position) Heel Raises: AROM;Both;10 reps;Standing Mini-Sqauts:  (defer, 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 Increased WOB with exercise but no sxs dizziness reported      Pertinent Vitals/Pain Pain Assessment: No/denies pain           PT Goals (current goals can now be found in the care plan section) Acute Rehab PT Goals Patient Stated  Goal: return to independence, be able to breathe PT Goal Formulation: With patient Time For Goal Achievement: 09/18/20 Potential to Achieve Goals: Good Progress towards PT goals: Progressing toward goals    Frequency    Min  3X/week      PT Plan Current plan remains appropriate       AM-PAC PT "6 Clicks" Mobility   Outcome Measure  Help needed turning from your back to your side while in a flat bed without using bedrails?: None Help needed moving from lying on your back to sitting on the side of a flat bed without using bedrails?: None Help needed moving to and from a bed to a chair (including a wheelchair)?: A Little Help needed standing up from a chair using your arms (e.g., wheelchair or bedside chair)?: A Little Help needed to walk in hospital room?: A Little Help needed climbing 3-5 steps with a railing? : A Little 6 Click Score: 20    End of Session Equipment Utilized During Treatment: Oxygen Activity Tolerance: Treatment limited secondary to medical complications (Comment);Other (comment) (HR elevated with standing exercises at bedside, defer gait trial due to HR ~150 bpm with minimal mobility tasks) Patient left: in bed;with call bell/phone within reach;with family/visitor present;with nursing/sitter in room (spouse present, pt sitting EOB, RN entering room) Nurse Communication: Mobility status;Other (comment) (tachycardia) PT Visit Diagnosis: Muscle weakness (generalized) (M62.81);Difficulty in walking, not elsewhere classified (R26.2);Other (comment)     Time: 0814-4818 PT Time Calculation (min) (ACUTE ONLY): 11 min  Charges:  $Therapeutic Exercise: 8-22 mins                     Jaydeen Darley P., PTA Acute Rehabilitation Services Pager: [phone] Office: [place] 09/05/2020, 6:05 PM

## 2020-09-05 NOTE — Progress Notes (Signed)
Progress Note  Patient Name: Gary [name] Date of Encounter: 09/05/2020  Primary Cardiologist: No primary care provider on file.   Subjective   Remains SOB. No CP. Discussed results of R/LHC and workup of PH. Wife not at bedside this morning, on her way in.   Inpatient Medications    Scheduled Meds: . atorvastatin  40 mg Oral Daily  . buPROPion  150 mg Oral BID  . enoxaparin (LOVENOX) injection  90 mg Subcutaneous Q12H  . insulin aspart  0-9 Units Subcutaneous TID WC  . sodium chloride flush  3 mL Intravenous Q12H  . sodium chloride flush  3 mL Intravenous Q12H   Continuous Infusions: . sodium chloride    . sodium chloride     PRN Meds:    Vital Signs    Vitals:   09/04/20 1935 09/04/20 2349 09/05/20 0308 09/05/20 0732  BP:  (!) 96/56 104/77 (!) 111/57  Pulse:  75 93 69  Resp:  20 (!) 22 (!) 21  Temp:  98.3 F (36.8 C) 98.2 F (36.8 C) 98.3 F (36.8 C)  TempSrc:  Oral Oral Oral  SpO2: 94% 94% 91% 95%  Weight:   86.9 kg   Height:        Intake/Output Summary (Last 24 hours) at 09/05/2020 0941 Last data filed at 09/05/2020 6659 Gross per 24 hour  Intake 603 ml  Output 1100 ml  Net -497 ml   Filed Weights   09/04/20 0400 09/05/20 0308  Weight: 86.3 kg 86.9 kg    Telemetry    Afib rates 120 after using the bathroom - Personally Reviewed  ECG    Afib, RBBB, repol change T wave abnl  - Personally Reviewed  Physical Exam   GEN: No acute distress.   Neck: JVP not appreciated sitting upright Cardiac: irregular and tachycardic  Respiratory: Clear to auscultation bilaterally. GI: Soft, nontender, non-distended  MS: No edema; No deformity. Neuro:  Nonfocal  Psych: Normal affect    Labs    Chemistry Recent Labs  Lab 09/03/20 1803 09/04/20 0414 09/04/20 1054 09/04/20 1100 09/04/20 1118 09/05/20 0413  NA 137 134*   < > 137  139 137 133*  K 4.6 4.0   < > 4.1  3.7 3.9 4.2  CL 101 99  --   --   --  99  CO2 25 24  --   --   --  26  GLUCOSE 82  75  --   --   --  116*  BUN 45* 49*  --   --   --  79*  CREATININE 1.57* 1.63*  --   --   --  1.27*  CALCIUM 8.7* 8.5*  --   --   --  8.0*  GFRNONAA 47* 45*  --   --   --  >60  ANIONGAP 11 11  --   --   --  8   < > = values in this interval not displayed.     Hematology Recent Labs  Lab 09/03/20 1040 09/04/20 0414 09/04/20 1054 09/04/20 1100 09/04/20 1118 09/05/20 0413  WBC 8.1 7.9  --   --   --  8.2  RBC 4.69 4.35  --   --   --  3.78*  HGB 14.6 13.4   < > 13.9  12.9* 12.9* 11.6*  HCT 44.1 40.8   < > 41.0  38.0* 38.0* 35.7*  MCV 94.0 93.8  --   --   --  94.4  MCH 31.1 30.8  --   --   --  30.7  MCHC 33.1 32.8  --   --   --  32.5  RDW 14.5 14.3  --   --   --  13.9  PLT 323 312  --   --   --  297   < > = values in this interval not displayed.    Cardiac EnzymesNo results for input(s): TROPONINI in the last 168 hours. No results for input(s): TROPIPOC in the last 168 hours.   BNP Recent Labs  Lab 09/03/20 1433  BNP 1,202.7*     DDimer No results for input(s): DDIMER in the last 168 hours.   Radiology    DG Chest 2 View  Result Date: 09/05/2020 CLINICAL DATA:  Acute respiratory failure. EXAM: CHEST - 2 VIEW COMPARISON:  CT 09/03/2020.  Chest x-ray 09/03/2020. FINDINGS: Stable cardiomegaly. Bilateral pulmonary infiltrates/edema again noted. No interim change from prior studies. Reference is made to prior CT report of 09/03/2020. No pleural effusion or pneumothorax. Degenerative change thoracic spine. IMPRESSION: 1. Bilateral pulmonary infiltrates/again noted. No interim change from prior studies. Reference is made to prior CT report of 09/03/2020. 2. Stable cardiomegaly. Electronically Signed   By: Marcello Moores  Register   On: 09/05/2020 06:20   CT ANGIO CHEST PE W OR WO CONTRAST  Result Date: 09/03/2020 CLINICAL DATA:  RIGHT ventricular dilatation, commas sign, worsening dyspnea, atrial fibrillation, suspected pulmonary embolism; history diabetes mellitus, hyperlipidemia,  hypertension EXAM: CT ANGIOGRAPHY CHEST WITH CONTRAST TECHNIQUE: Multidetector CT imaging of the chest was performed using the standard protocol during bolus administration of intravenous contrast. Multiplanar CT image reconstructions and MIPs were obtained to evaluate the vascular anatomy. CONTRAST:  68m OMNIPAQUE IOHEXOL 350 MG/ML SOLN IV COMPARISON:  06/27/2020 FINDINGS: Cardiovascular: Atherosclerotic calcifications aorta, proximal great vessels, and coronary arteries. Ascending thoracic aorta upper normal caliber 3.9 cm diameter. Enlargement of cardiac chambers especially RIGHT atrium and RIGHT ventricle. No pericardial effusion. Pulmonary arteries adequately opacified and patent. No evidence of pulmonary embolism. Enlargement of central pulmonary arteries consistent with pulmonary arterial hypertension. Mediastinum/Nodes: Esophagus unremarkable. Base of cervical region normal appearance. Normal sized RIGHT paratracheal lymph nodes up to 10 mm diameter single enlarged subcarinal node 1.8 cm image 38 previously 1.4 cm. Lungs/Pleura: Small BILATERAL pleural effusions greater on RIGHT. Patchy infiltrates RIGHT lung favor pneumonia. An area of opacity at the RIGHT middle lobe was present on the previous exam, little changed, 2.7 x 1.2 X 2.5 cm. Minimal dependent atelectasis LEFT lung with LEFT lung otherwise clear. Upper Abdomen: Reflux of contrast into IVC and hepatic veins. Visualized upper abdomen otherwise unremarkable. Musculoskeletal: Degenerative disc disease changes thoracic spine. Review of the MIP images confirms the above findings. IMPRESSION: No evidence of pulmonary embolism. Enlarged central pulmonary arteries consistent with pulmonary arterial hypertension. Associated enlargement of cardiac chambers especially RIGHT ventricle and RIGHT atrium. Small BILATERAL pleural effusions greater on RIGHT. Patchy infiltrates RIGHT lung favor pneumonia. Persistent area of opacity in the RIGHT middle lobe could  represent atelectasis, unresolved infiltrate, tumor not excluded; recommend continued follow-up until resolution and if this fails to resolve then consider further assessment by PET-CT. Single enlarged subcarinal lymph node 1.8 cm diameter, previously 1.4 cm, nonspecific. Aortic Atherosclerosis (ICD10-I70.0). Electronically Signed   By: MLavonia DanaM.D.   On: 09/03/2020 20:48   CARDIAC CATHETERIZATION  Result Date: 09/04/2020  Dist RCA lesion is 100% stenosed.  Ost LAD to Mid LAD lesion is 20% stenosed.  Mid LAD  lesion is 40% stenosed.  Prox RCA lesion is 40% stenosed.  Findings: Ao = 95/60 (76) LV = 95/14 RA = 12 RV = 60/13 PA = 66/31 (46) PCW = 13 Fick cardiac output/index = 4.1/2.1 PVR = 7.8 WU Ao sat = 99% PA sat =63%, 65% Assessment: 1. 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 There is a large RV marginal branch that is widely patent 2. EF 60-65% 3. Moderate PAH with elevated PVR and moderately reduced cardiac output Plan/Discussion: Will manage CAD medically. Will need w/u for PAH and cor pulmonale . Glori Bickers, MD 11:51 AM   DG Chest Portable 1 View  Result Date: 09/03/2020 CLINICAL DATA:  Dyspnea on exertion. EXAM: PORTABLE CHEST 1 VIEW COMPARISON:  None. FINDINGS: Mild cardiomegaly is noted. No pneumothorax is noted. Bibasilar atelectasis, edema or infiltrates are noted with probable small left pleural effusion. Bony thorax is unremarkable. IMPRESSION: Bibasilar atelectasis, edema or infiltrates are noted with probable small left pleural effusion. Electronically Signed   By: Marijo Conception M.D.   On: 09/03/2020 10:32   ECHOCARDIOGRAM COMPLETE  Result Date: 09/03/2020    ECHOCARDIOGRAM REPORT   Patient Name:   Gary [name] Date of Exam: 09/03/2020 Medical Rec #:  [id number]   Height:       67.5 in Accession #:    [id number]  Weight:       232.0 lb Date of Birth:  1951/02/06   BSA:          2.166 m Patient Age:    27 years    BP:            98/80 mmHg Patient Gender: M           HR:           89 bpm. Exam Location:  Inpatient Procedure: 2D Echo, Cardiac Doppler and Color Doppler Indications:    I48.91* Unspeicified atrial fibrillation  History:        Patient has no prior history of Echocardiogram examinations.                 Risk Factors:Former Smoker.  Sonographer:    Merrie Roof RDCS Referring Phys: [id number] CARINA M BROWN IMPRESSIONS  1. There is severe RV enlargement with severely depressed RV systolic function and moderately elevated PASP. The interventricular septum is flattened in systole consistent with RV pressure overload. Constellation of findings highly concerning for pulmonary embolism. Cardiology team aware and CTA pending.  2. Left ventricular ejection fraction, by estimation, is 55 to 60%. The left ventricle has normal function. Wall motion difficult to assess due to poor visualization of the LV endocardium but appears grossly normal. There is mild concentric left ventricular hypertrophy. Diastolic function is indeterminant due to atrial fibrillation. There is the interventricular septum is flattened in systole, consistent with right ventricular pressure overload.  3. Right ventricular systolic function is severely reduced. The right ventricular size is severely enlarged. There is moderately elevated pulmonary artery systolic pressure. The estimated right ventricular systolic pressure is 70.9 mmHg.  4. Right atrial size was moderately dilated.  5. The mitral valve is grossly normal. Mild mitral valve regurgitation.  6. Tricuspid valve regurgitation is moderate.  7. The aortic valve is tricuspid. There is moderate calcification of the aortic valve. There is moderate thickening of the aortic valve. Aortic valve regurgitation is trivial. Mild to moderate aortic valve sclerosis/calcification is present, without any  evidence of aortic stenosis.  8. The  inferior vena cava is dilated in size with <50% respiratory variability, suggesting  right atrial pressure of 15 mmHg. Comparison(s): No prior Echocardiogram. FINDINGS  Left Ventricle: Left ventricular ejection fraction, by estimation, is 55 to 60%. The left ventricle has normal function. Wall motion diffiicult to assess due to poor visualization of LV endocardium but appears grossly normal. The left ventricular internal cavity size was normal in size. There is mild concentric left ventricular hypertrophy. The interventricular septum is flattened in systole, consistent with right ventricular pressure overload. Diastolic function is indeterminant due to atrial fibrillation. Right Ventricle: The right ventricular size is severely enlarged. Right vetricular wall thickness was not assessed. Right ventricular systolic function is severely reduced. There is moderately elevated pulmonary artery systolic pressure. The tricuspid regurgitant velocity is 3.33 m/s, and with an assumed right atrial pressure of 15 mmHg, the estimated right ventricular systolic pressure is 21.1 mmHg. Left Atrium: Left atrial size was normal in size. Right Atrium: Right atrial size was moderately dilated. Pericardium: There is no evidence of pericardial effusion. Presence of pericardial fat pad. Mitral Valve: The mitral valve is grossly normal. There is mild thickening of the mitral valve leaflet(s). There is mild calcification of the mitral valve leaflet(s). Mild mitral annular calcification. Mild mitral valve regurgitation. Tricuspid Valve: The tricuspid valve is normal in structure. Tricuspid valve regurgitation is moderate. Aortic Valve: The aortic valve is tricuspid. There is moderate calcification of the aortic valve. There is moderate thickening of the aortic valve. Aortic valve regurgitation is trivial. Mild to moderate aortic valve sclerosis/calcification is present, without any evidence of aortic stenosis. Pulmonic Valve: The pulmonic valve was normal in structure. Pulmonic valve regurgitation is not visualized. Aorta:  The aortic root is normal in size and structure. Venous: The inferior vena cava is dilated in size with less than 50% respiratory variability, suggesting right atrial pressure of 15 mmHg. IAS/Shunts: No atrial level shunt detected by color flow Doppler.  LEFT VENTRICLE PLAX 2D LVIDd:         4.90 cm  Diastology LVIDs:         3.40 cm  LV e' lateral: 7.29 cm/s LV PW:         1.10 cm LV IVS:        1.00 cm LVOT diam:     2.00 cm LV SV:         34 LV SV Index:   16 LVOT Area:     3.14 cm  RIGHT VENTRICLE            IVC RV Basal diam:  4.70 cm    IVC diam: 2.70 cm RV Mid diam:    5.10 cm RV S prime:     9.57 cm/s TAPSE (M-mode): 0.6 cm LEFT ATRIUM             Index       RIGHT ATRIUM           Index LA diam:        4.20 cm 1.94 cm/m  RA Area:     23.30 cm LA Vol (A2C):   71.7 ml 33.10 ml/m RA Volume:   75.30 ml  34.77 ml/m LA Vol (A4C):   61.2 ml 28.26 ml/m LA Biplane Vol: 72.4 ml 33.43 ml/m  AORTIC VALVE LVOT Vmax:   74.40 cm/s LVOT Vmean:  51.400 cm/s LVOT VTI:    0.109 m  AORTA Ao Root diam: 3.30 cm TRICUSPID VALVE TR Peak grad:   44.4 mmHg TR  Vmax:        333.00 cm/s  SHUNTS Systemic VTI:  0.11 m Systemic Diam: 2.00 cm Gwyndolyn Kaufman MD Electronically signed by Gwyndolyn Kaufman MD Signature Date/Time: 09/03/2020/8:05:33 PM    Final     Cardiac Studies   echo   1. There is severe RV enlargement with severely depressed RV systolic  function and moderately elevated PASP. The interventricular septum is  flattened in systole consistent with RV pressure overload. Constellation  of findings highly concerning for  pulmonary embolism. Cardiology team aware and CTA pending.   2. Left ventricular ejection fraction, by estimation, is 55 to 60%. The  left ventricle has normal function. Wall motion difficult to assess due to  poor visualization of the LV endocardium but appears grossly normal. There  is mild concentric left  ventricular hypertrophy. Diastolic function is indeterminant due to atrial   fibrillation. There is the interventricular septum is flattened in  systole, consistent with right ventricular pressure overload.   3. Right ventricular systolic function is severely reduced. The right  ventricular size is severely enlarged. There is moderately elevated  pulmonary artery systolic pressure. The estimated right ventricular  systolic pressure is 38.9 mmHg.   4. Right atrial size was moderately dilated.   5. The mitral valve is grossly normal. Mild mitral valve regurgitation.   6. Tricuspid valve regurgitation is moderate.   7. The aortic valve is tricuspid. There is moderate calcification of the  aortic valve. There is moderate thickening of the aortic valve. Aortic  valve regurgitation is trivial. Mild to moderate aortic valve  sclerosis/calcification is present, without any   evidence of aortic stenosis.   8. The inferior vena cava is dilated in size with <50% respiratory  variability, suggesting right atrial pressure of 15 mmHg.    Patient Profile     70 y.o. male with progressive dyspnea. 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   Assessment & Plan   Principal Problem:   Acute hypoxemic respiratory failure (HCC) Active Problems:   CHF exacerbation (HCC)   Hyperlipidemia due to type 2 diabetes mellitus (HCC)   Atrial fibrillation (HCC)   Right bundle branch block   Acute hypoxic respiratory failure Right heart failure Pulmonary HTN - distal RCA is chronically occluded but no proximal obstructive CAD to account for RV failure. - PCWP 13, unlikely to need more IV diuresis. - PA mean 46, RA 12, RV 60/13, needs workup for pulmonary HTN. My suspicion is OHS/OSA, however needs full evaluation. Will be coordinated by PCCM who is newly on board and AHF who has been following.   -Cr: improved,  1.27 UOP yesterday: -1.5 L Weight: 86 kg Net negative for admission: -2.4L Diuretic plan: with PCWP of 13, can use oral diuresis prn but may not need further IV diuretic in hospital. Suspect remaining workup can occur outpatient. Per AHF, needs home O2.  Afib - overall rate controlled with no additional therapy.   CAD - medical management ASA and statin LV fxn normal.    For questions or updates, please contact [facility] Please consult [email or web address] for contact info under        Signed, Elouise Munroe, MD  09/05/2020, 9:41 AM

## 2020-09-05 NOTE — Progress Notes (Signed)
NAME:  Gary [name], MRN:  [id number], DOB:  [birth year], LOS: 1 ADMISSION DATE:  09/03/2020, CONSULTATION DATE:  09/04/20 REFERRING MD:  Dr. Gwendlyn Deutscher, CHIEF COMPLAINT:  SOB   History of Present Illness:  70 y/o M who presented to [facility] on 4/18 with complaints of shortness of breath.    The patient lives at home with his wife.  He is independent of all ADL's.  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  He is a former smoker - smoked from 1968-2005 off / on, heaviest 1.5 ppd.  He grew up in [place], [place] with coal burning stove.  He has multiple hobbies to include Civil War reenactment with black powder rifles, Actuary, & woodworking.  He denies known history of connective tissue disease within family.  His daughter had Turner's syndrome (deceased at age 53 in 50).  The patient reports he had a sleep study in 2016 which did not warrant intervention.  He also reports a significant nosebleed in 2015 that required balloon packing and additional procedure that sounded like embolization by description for bleeding (per ENT in [place]).  No hx of obstructive sleep apnea (had a negative sleep study in the past), no hx of blood clots.   He reports 3 months prior to presentation he had no shortness of breath with activity.  Patient noted in February 2022 that he began having shortness of breath with exertion and activities that normally were not difficult for him (walking uphill to go to church, taking the dogs out).  He also noted feeling full early in meals.  His wife reports that in the last 2 to 3 weeks he has had significant shortness of breath, decreased p.o. intake.  She noted that on 4/16 and 4/17 he was so short of breath to the point that he did not talk much over the weekend prompting evaluation on 4/18.  He reported difficulty walking into the emergency room.  His wife reported that he had poor  coloring with a grayish appearance.    The patient was admitted per teaching service for further evaluation.  Initially required 4 L of oxygen to maintain oxygen saturations which was quickly escalated to 15 L salter.  Cardiology was consulted with new finding of atrial fibrillation.  Echo evaluation demonstrated severe RV enlargement with severely depressed RV systolic function and moderately elevated PASP, LVEF 55-60%, difficult to assess wall motion abnormalities but appeared grossly normal, mild concentric left ventricular hypertrophy, RV systolic function severely reduced, RV severely enlarged, moderately elevated pulmonary artery pressure with estimated RV systolic pressure of 71.2, RA moderately dilated.  Given echo findings, a CTA of the chest was evaluated and negative for pulmonary embolism but demonstrated enlarged central pulmonary arteries consistent with pulmonary arterial hypertension, enlargement of the right ventricle and right atrium, small bilateral pleural effusions greater on the right and patchy groundglass opacities bilaterally, unresolved right middle lobe opacity.  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  He continued to require 15 L salter oxygen to maintain saturations above 90.  PCCM consulted for evaluation of hypoxemic respiratory failure  and catheterization findings.    Pertinent  Medical History  Former smoker HTN DM Traumatic crush injury from a tree branch with multiple fractures Epistaxis requiring packing   Significant Hospital Events: Including procedures, antibiotic start and stop dates in addition to other pertinent events   . 4/18  admitted with SOB, echo with elevated right heart pressures, new finding of A. fib . 4/19 R/L heart cath with concern for pulmonary hypertension  Interim History / Subjective:  O2 turned back up overnight due to desaturations while sleeping. He denies complaints today. Wife endorses witnessed apneas at home, but no snoring. He refused CPAP overnight.   Objective   Blood pressure (!) 111/57, pulse 69, temperature 98.3 F (36.8 C), temperature source Oral, resp. rate (!) 21, height 5' 7"  (1.702 m), weight 86.9 kg, SpO2 95 %.        Intake/Output Summary (Last 24 hours) at 09/05/2020 0915 Last data filed at 09/05/2020 5625 Gross per 24 hour  Intake 603 ml  Output 1100 ml  Net -497 ml   Filed Weights   09/04/20 0400 09/05/20 0308  Weight: 86.3 kg 86.9 kg    Examination: General: elderly man lying in bed in NAD  HEENT: [place]/AT, eyes anicteric Neuro: awake, alert, able to sit forward independently. Answering questions appropriately. CV: S1S2, RRR PULM: Breathing comfortably on 15L [place], minimal air movement, no accessory lung sounds. GI: soft, NT, ND Extremities: No clubbing or cyanosis. Mild LE edema. Skin: No rashes or ecchymoses.  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  New AF noted on admit as well as findings of enlarged pulmonary artery on CTA with background mild ground glass, negative for PE, persistent opacities on R and R>L pleural effusion.  No significant background emphysema.  ECHO showed severe RV enlargement with moderate  pulmonary HTN. Nonsignificant CAD, Elevated R-sided pressures with normal L-sided pressures on RHC. 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  -Mildly elevated CRP and ESR of unknown significance. -HIV Ab, CCP & RF, ANA pending -Wean supplemental O2 as able to maintain SpO2 >90%. Seems to have increased nighttime oxygen requirements raising increased concern for sleep-disordered breathing, but he has refused NIPPV overnight. Needs PSG as an outpatient -Needs OP PFTs to assess for COPD. Could consider starting empiric LABA/LAMA (Anoro) given risk factors. -Con't diuresis. -wean supplemental O2 as able. Anticipate he may require supplemental oxygen at discharge -would only do thoracentesis if persistent despite diuresis -OOB mobility, IS   Atrial Fibrillation  RBBB CAD  -AC and rate control per Cardiology -may required DCCV next month  AKI improving with diuresis -con't to monitor -diuresis  Mediastinal adenopathy vs hilar R lung mass  -needs reimaging in 1 month and would defer biopsy until more compensated    Best practice (right click and "Reselect all SmartList Selections" daily)  Per Primary   Labs   CBC: Recent Labs  Lab 09/03/20 1040 09/04/20 0414 09/04/20 1054 09/04/20 1100 09/04/20 1118 09/05/20 0413  WBC 8.1 7.9  --   --   --  8.2  HGB 14.6 13.4 13.3  13.9  12.9* 12.9* 11.6*  HCT 44.1 40.8 39.0 41.0  38.0* 38.0* 35.7*  MCV 94.0 93.8  --   --   --  94.4  PLT 323 312  --   --   --  862    Basic Metabolic Panel: Recent Labs  Lab 09/03/20 1040 09/03/20 1803 09/04/20 0414 09/04/20 1054 09/04/20 1100 09/04/20 1118 09/05/20 0413  NA 134* 137 134* 137 137  139 137 133*  K 4.5 4.6 4.0 3.9 4.1  3.7 3.9 4.2  CL 99 101 99  --   --   --  99  CO2 21* 25 24  --   --   --  26  GLUCOSE 116* 82 75  --   --   --  116*  BUN 41* 45* 49*  --   --   --  79*  CREATININE 1.46* 1.57* 1.63*  --   --   --  1.27*  CALCIUM 8.8* 8.7* 8.5*  --   --   --  8.0*  MG  --   --  1.8  --   --   --    --      Julian Hy, DO 09/05/20 1:25 PM [facility]

## 2020-09-06 ENCOUNTER — Inpatient Hospital Stay (HOSPITAL_COMMUNITY): Payer: Medicare Other

## 2020-09-06 ENCOUNTER — Encounter (HOSPITAL_COMMUNITY): Payer: Self-pay | Admitting: Certified Registered Nurse Anesthetist

## 2020-09-06 DIAGNOSIS — I2721 Secondary pulmonary arterial hypertension: Secondary | ICD-10-CM | POA: Diagnosis not present

## 2020-09-06 DIAGNOSIS — I959 Hypotension, unspecified: Secondary | ICD-10-CM

## 2020-09-06 DIAGNOSIS — I272 Pulmonary hypertension, unspecified: Secondary | ICD-10-CM

## 2020-09-06 DIAGNOSIS — I4891 Unspecified atrial fibrillation: Secondary | ICD-10-CM | POA: Diagnosis not present

## 2020-09-06 DIAGNOSIS — I50813 Acute on chronic right heart failure: Secondary | ICD-10-CM | POA: Diagnosis not present

## 2020-09-06 DIAGNOSIS — I50810 Right heart failure, unspecified: Secondary | ICD-10-CM | POA: Diagnosis not present

## 2020-09-06 DIAGNOSIS — J9601 Acute respiratory failure with hypoxia: Secondary | ICD-10-CM | POA: Diagnosis not present

## 2020-09-06 DIAGNOSIS — D649 Anemia, unspecified: Secondary | ICD-10-CM | POA: Diagnosis not present

## 2020-09-06 LAB — BASIC METABOLIC PANEL
Anion gap: 8 (ref 5–15)
Anion gap: 5 (ref 5–15)
BUN: 57 mg/dL — ABNORMAL HIGH (ref 8–23)
BUN: 67 mg/dL — ABNORMAL HIGH (ref 8–23)
CO2: 26 mmol/L (ref 22–32)
CO2: 25 mmol/L (ref 22–32)
Calcium: 8.2 mg/dL — ABNORMAL LOW (ref 8.9–10.3)
Calcium: 7.6 mg/dL — ABNORMAL LOW (ref 8.9–10.3)
Chloride: 101 mmol/L (ref 98–111)
Chloride: 101 mmol/L (ref 98–111)
Creatinine, Ser: 1.14 mg/dL (ref 0.61–1.24)
Creatinine, Ser: 1.12 mg/dL (ref 0.61–1.24)
GFR, Estimated: >60 mL/min (ref >60)
GFR, Estimated: >60 mL/min (ref >60)
Glucose, Bld: 184 mg/dL — ABNORMAL HIGH (ref 70–99)
Glucose, Bld: 162 mg/dL — ABNORMAL HIGH (ref 70–99)
Potassium: 4.5 mmol/L (ref 3.5–5.1)
Potassium: 4.6 mmol/L (ref 3.5–5.1)
Sodium: 135 mmol/L (ref 135–145)
Sodium: 131 mmol/L — ABNORMAL LOW (ref 135–145)

## 2020-09-06 LAB — ENA+DNA/DS+ANTICH+CENTRO+JO...
Anti JO-1: 0.4 AI (ref 0.0–0.9)
Centromere Ab Screen: <0.2 AI (ref 0.0–0.9)
Chromatin Ab SerPl-aCnc: 1.1 AI — ABNORMAL HIGH (ref 0.0–0.9)
ENA SM Ab Ser-aCnc: <0.2 AI (ref 0.0–0.9)
Ribonucleic Protein: 0.3 AI (ref 0.0–0.9)
SSA (Ro) (ENA) Antibody, IgG: <0.2 AI (ref 0.0–0.9)
SSB (La) (ENA) Antibody, IgG: <0.2 AI (ref 0.0–0.9)
Scleroderma (Scl-70) (ENA) Antibody, IgG: <0.2 AI (ref 0.0–0.9)
ds DNA Ab: <1 IU/mL (ref 0–9)

## 2020-09-06 LAB — CBC
HCT: 30.7 % — ABNORMAL LOW (ref 39.0–52.0)
HCT: 27.8 % — ABNORMAL LOW (ref 39.0–52.0)
Hemoglobin: 9.9 g/dL — ABNORMAL LOW (ref 13.0–17.0)
Hemoglobin: 9.1 g/dL — ABNORMAL LOW (ref 13.0–17.0)
MCH: 30.5 pg (ref 26.0–34.0)
MCH: 31.2 pg (ref 26.0–34.0)
MCHC: 32.2 g/dL (ref 30.0–36.0)
MCHC: 32.7 g/dL (ref 30.0–36.0)
MCV: 94.5 fL (ref 80.0–100.0)
MCV: 95.2 fL (ref 80.0–100.0)
Platelets: 280 10*3/uL (ref 150–400)
Platelets: 283 10*3/uL (ref 150–400)
RBC: 3.25 MIL/uL — ABNORMAL LOW (ref 4.22–5.81)
RBC: 2.92 MIL/uL — ABNORMAL LOW (ref 4.22–5.81)
RDW: 13.6 % (ref 11.5–15.5)
RDW: 13.9 % (ref 11.5–15.5)
WBC: 11 10*3/uL — ABNORMAL HIGH (ref 4.0–10.5)
WBC: 9.7 10*3/uL (ref 4.0–10.5)
nRBC: 0 % (ref 0.0–0.2)
nRBC: 0 % (ref 0.0–0.2)

## 2020-09-06 LAB — GLUCOSE, CAPILLARY
Glucose-Capillary: 203 mg/dL — ABNORMAL HIGH (ref 70–99)
Glucose-Capillary: 164 mg/dL — ABNORMAL HIGH (ref 70–99)
Glucose-Capillary: 216 mg/dL — ABNORMAL HIGH (ref 70–99)
Glucose-Capillary: 177 mg/dL — ABNORMAL HIGH (ref 70–99)

## 2020-09-06 LAB — RHEUMATOID FACTOR: Rheumatoid fact SerPl-aCnc: <10 IU/mL (ref <14.0)

## 2020-09-06 LAB — ANA W/REFLEX IF POSITIVE: Anti Nuclear Antibody (ANA): POSITIVE — AB

## 2020-09-06 LAB — MAGNESIUM: Magnesium: 1.6 mg/dL — ABNORMAL LOW (ref 1.7–2.4)

## 2020-09-06 MED ORDER — METOPROLOL TARTRATE 25 MG PO TABS
25.0000 mg | ORAL_TABLET | Freq: Three times a day (TID) | ORAL | Status: DC
  Administered 2020-09-06: 25 mg via ORAL
  Filled 2020-09-06 (×4): qty 1

## 2020-09-06 MED ORDER — METOPROLOL TARTRATE 5 MG/5ML IV SOLN: 2.5000 mg | Freq: Once | INTRAVENOUS | Status: AC

## 2020-09-06 MED ORDER — MAGNESIUM SULFATE 2 GM/50ML IV SOLN
2.0000 g | Freq: Once | INTRAVENOUS | Status: AC
  Administered 2020-09-06: 2 g via INTRAVENOUS
  Filled 2020-09-06: qty 50

## 2020-09-06 MED ORDER — IPRATROPIUM-ALBUTEROL 0.5-2.5 (3) MG/3ML IN SOLN
3.0000 mL | Freq: Four times a day (QID) | RESPIRATORY_TRACT | Status: DC
  Administered 2020-09-06 (×2): 3 mL via RESPIRATORY_TRACT
  Filled 2020-09-06 (×2): qty 3

## 2020-09-06 MED ORDER — SODIUM CHLORIDE 0.9 % IV BOLUS
250.0000 mL | Freq: Once | INTRAVENOUS | Status: AC
  Administered 2020-09-06: 250 mL via INTRAVENOUS

## 2020-09-06 MED ORDER — METOPROLOL TARTRATE 5 MG/5ML IV SOLN
INTRAVENOUS | Status: AC
  Administered 2020-09-06: 2.5 mg via INTRAVENOUS
  Filled 2020-09-06: qty 5

## 2020-09-06 NOTE — Progress Notes (Addendum)
FPTS Interim Progress Note  S: Evaluated patient at bedside after receiving page from RN regarding HR sustaining in the 110s-140s range. Patient was sleeping during this time, currently asymptomatic. Reports no issues with his breathing.   O: BP 115/80 (BP Location: Right Arm)   Pulse (!) 139   Temp 98.9 F (37.2 C) (Oral)   Resp 20   Ht 5' 7"  (1.702 m)   Wt 86.9 kg   SpO2 92%   BMI 29.99 kg/m   Gen: resting in bed comfortably, NAD CV: tachycardic, irregularly irregular, no murmurs Pulm: CTAB, mildly tachypneic on 6L HFNC  HR primarily in the 120s on monitor, occasionally going into the 130s SpO2 mid 90s on monitor  EKG obtained consistent with A Fib with RVR 115. T wave inversions seen without significant change from prior.  A/P: A Fib with RVR, previously rate-controlled without medications. Received IV metoprolol tartrate 2.5 mg with good effect, HR 90s to low 100s on monitors now. Spoke with cardiology fellow Dr. Paticia Stack who recommended PO metoprolol tartrate 25 mg up to 3 times a day. - s/p IV metoprolol tartrate 2.5 mg - PO metoprolol tartrate 25 mg TID (hold if SBP < 90 or HR < 65) - goal HR < 110 - trend troponins x2 - Mg - continue cardiac monitoring  [name], MD 09/06/2020, 4:13 AM PGY-1, [facility] pager [phone]

## 2020-09-06 NOTE — Progress Notes (Signed)
Pt bp 75/54 Cards, NP made aware. Orders for bolus NS  received. Family at the bedside.

## 2020-09-06 NOTE — Progress Notes (Signed)
Called by RN for pt's drop in systolic BP to 75 Pt without complaint - checked with Dr. Haroldine Laws and will give NS up to 500cc   After first 300 BP 91 systolic and will repeat another 250

## 2020-09-06 NOTE — Progress Notes (Signed)
NAME:  Gary [name], MRN:  [id number], DOB:  1950-10-15, LOS: 2 ADMISSION DATE:  09/03/2020, CONSULTATION DATE:  09/04/20 REFERRING MD:  Dr. Gwendlyn Deutscher, CHIEF COMPLAINT:  SOB   History of Present Illness:  70 y/o M who presented to [facility] on 4/18 with complaints of shortness of breath.    The patient lives at home with his wife.  He is independent of all ADL's.  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  He is a former smoker - smoked from 1968-2005 off / on, heaviest 1.5 ppd.  He grew up in [place], [place] with coal burning stove.  He has multiple hobbies to include Civil War reenactment with black powder rifles, Actuary, & woodworking.  He denies known history of connective tissue disease within family.  His daughter had Turner's syndrome (deceased at age 11 in 43).  The patient reports he had a sleep study in 2016 which did not warrant intervention.  He also reports a significant nosebleed in 2015 that required balloon packing and additional procedure that sounded like embolization by description for bleeding (per ENT in [place]).  No hx of obstructive sleep apnea (had a negative sleep study in the past), no hx of blood clots.   He reports 3 months prior to presentation he had no shortness of breath with activity.  Patient noted in February 2022 that he began having shortness of breath with exertion and activities that normally were not difficult for him (walking uphill to go to church, taking the dogs out).  He also noted feeling full early in meals.  His wife reports that in the last 2 to 3 weeks he has had significant shortness of breath, decreased p.o. intake.  She noted that on 4/16 and 4/17 he was so short of breath to the point that he did not talk much over the weekend prompting evaluation on 4/18.  He reported difficulty walking into the emergency room.  His wife reported that he had poor  coloring with a grayish appearance.    The patient was admitted per teaching service for further evaluation.  Initially required 4 L of oxygen to maintain oxygen saturations which was quickly escalated to 15 L salter.  Cardiology was consulted with new finding of atrial fibrillation.  Echo evaluation demonstrated severe RV enlargement with severely depressed RV systolic function and moderately elevated PASP, LVEF 55-60%, difficult to assess wall motion abnormalities but appeared grossly normal, mild concentric left ventricular hypertrophy, RV systolic function severely reduced, RV severely enlarged, moderately elevated pulmonary artery pressure with estimated RV systolic pressure of 74.8, RA moderately dilated.  Given echo findings, a CTA of the chest was evaluated and negative for pulmonary embolism but demonstrated enlarged central pulmonary arteries consistent with pulmonary arterial hypertension, enlargement of the right ventricle and right atrium, small bilateral pleural effusions greater on the right and patchy groundglass opacities bilaterally, unresolved right middle lobe opacity.  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  He continued to require 15 L salter oxygen to maintain saturations above 90.  PCCM consulted for evaluation of hypoxemic respiratory failure  and catheterization findings.    Pertinent  Medical History  Former smoker HTN DM Traumatic crush injury from a tree branch with multiple fractures Epistaxis requiring packing   Significant Hospital Events: Including procedures, antibiotic start and stop dates in addition to other pertinent events   . 4/18  admitted with SOB, echo with elevated right heart pressures, new finding of A. fib . 4/19 R/L heart cath with concern for pulmonary hypertension . 4/20 on 15L HFNC . 4/21 hypotensive episode, responded to IVF, down to 5L O2  Interim History / Subjective:  Feels less SOB today, was able to walk to the bathroom without gasping for air. Cough with clear sputum stable- he attributes this to post-nasal drip chronically. Was up in the chair early this morning.  Objective   Blood pressure 97/67, pulse 84, temperature 98.5 F (36.9 C), temperature source Oral, resp. rate (!) 26, height 5' 7"  (1.702 m), weight 86.5 kg, SpO2 95 %.        Intake/Output Summary (Last 24 hours) at 09/06/2020 1436 Last data filed at 09/06/2020 0523 Gross per 24 hour  Intake 663 ml  Output 800 ml  Net -137 ml   Filed Weights   09/04/20 0400 09/05/20 0308 09/06/20 0530  Weight: 86.3 kg 86.9 kg 86.5 kg    Examination: General: elderly man sitting up in bed in NAD HEENT: [place]/AT, eyes anicteric Neuro: awake, alert, able to sit forward independently. Answering questions appropriately. CV: S1S2, irreg rhyrhm, reg rate in 90s. PULM: mild tachypnea and conversational dyspnea comparable to previous exams. On 6L . No accessory muscle use. CTAB, improved air movement throughout.  GI: soft, NT Extremities: No clubbing or cyanosis. No pretibial edema. Skin: no rashes or obvious bruising.  ESR 44 CRP 3.2 Anti - CCP pending RF <10 ANA pending HIV ab negative  BUN/Cr 79/ 1.27   H/H  9.1/27.8; baseline Hb 13-14 at admission. 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  New AF noted on admit as well as findings of enlarged pulmonary artery on CTA with background mild ground glass, negative for PE, persistent opacities on R and R>L pleural effusion.  No significant background emphysema.  ECHO showed severe RV enlargement with moderate pulmonary HTN. Nonsignificant CAD, Elevated R-sided pressures with normal L-sided pressures on RHC. 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  -Mildly elevated CRP and ESR of unknown significance. Awaiting CCP and ANA to help understand the significance. -Wean supplemental O2 as able to maintain SpO2 >90%. Down to 5L today. -Needs PSG as an outpatient -Needs OP PFTs to assess for COPD. Empiric trial of duonebs Q6h. Holding steroids at this point due to concern for bleeding in unknown location. -Hold additional diuresis. -Wean supplemental O2 as able. Anticipate he may require supplemental oxygen at discharge -OOB mobility, IS   Acute anemia- rather significant drop in Hb of 4-5 grams over the past few days despite diuresis. Recently started on [facility] is worrisome for occult bleeding, especially with hypotension this morning responsive to IVF. He has a history of epistaxis requiring embolization in the remote past,  but no other significant bleeding history. -Recommend CT abd/pelvis to evaluate for RP bleed or another source of occult bleeding; d/w Cardiology & primary team. -Recommend holding AC until Hb stabilizes.   -Recommend serial CBC's and transfusion if Hb<7 or if he has hemodynamically significant bleeding with transfusion goal of hemodynamic stability.   Atrial Fibrillation  RBBB CAD  -AC and rate control per Cardiology -may required DCCV   -recommend holding AC currently  AKI improving with diuresis -con't to monitor -hold additional diuresis  Mediastinal adenopathy vs hilar R lung mass  -needs reimaging in 1 month  and would defer biopsy until more compensated    Best practice (right click and "Reselect all SmartList Selections" daily)  Per Primary   Labs   CBC: Recent Labs  Lab 09/03/20 1040 09/04/20 0414 09/04/20 1054 09/04/20 1100 09/04/20 1118 09/05/20 0413 09/06/20 0452 09/06/20 1247  WBC 8.1 7.9  --   --   --  8.2 11.0* 9.7  HGB 14.6 13.4   < > 13.9  12.9* 12.9* 11.6* 9.9* 9.1*  HCT 44.1 40.8   < > 41.0  38.0* 38.0* 35.7* 30.7* 27.8*  MCV 94.0 93.8  --   --   --  94.4 94.5 95.2  PLT 323 312  --   --   --  297 280 283   < > = values in this interval not displayed.    Basic Metabolic Panel: Recent Labs  Lab 09/03/20 1803 09/04/20 0414 09/04/20 1054 09/04/20 1100 09/04/20 1118 09/05/20 0413 09/06/20 0452 09/06/20 1247  NA 137 134*   < > 137  139 137 133* 135 131*  K 4.6 4.0   < > 4.1  3.7 3.9 4.2 4.5 4.6  CL 101 99  --   --   --  99 101 101  CO2 25 24  --   --   --  26 26 25   GLUCOSE 82 75  --   --   --  116* 184* 162*  BUN 45* 49*  --   --   --  79* 57* 67*  CREATININE 1.57* 1.63*  --   --   --  1.27* 1.14 1.12  CALCIUM 8.7* 8.5*  --   --   --  8.0* 8.2* 7.6*  MG  --  1.8  --   --   --   --  1.6*  --    < > = values in this interval not displayed.     Julian Hy, DO 09/06/20 3:04 PM [facility]

## 2020-09-06 NOTE — Progress Notes (Addendum)
Progress Note  Patient Name: Gary [name] Date of Encounter: 09/06/2020  Primary Cardiologist: Elouise Munroe, MD  Subjective   CTSP for hypotension - APP colleague Mickel Baas had taken the call while she was at another hospital. BP this AM 75/54. Per her discussion with Dr. Haroldine Laws, 250 cc bolus given with 91 systolic then another 599 cc bolus given with repeat BP 94/68. Per patient's wife/nurse patient had slowed mentation around time of soft BP but no focal deficits - he perked up with fluid bolus. He denies any acute complaints currently. He appears visibly SOB to me with some accessory muscle use but both he and wife state he looks actually better today. In talking with Drs. Margaretann Loveless and Dr. Haroldine Laws this appearance is similar to the last few days. Patient and wife deny any recent sx of GIB. No discomfort otherwise.  Inpatient Medications    Scheduled Meds: . apixaban  5 mg Oral BID  . atorvastatin  40 mg Oral Daily  . buPROPion  150 mg Oral BID  . insulin aspart  0-9 Units Subcutaneous TID WC  . metoprolol tartrate  25 mg Oral Q8H  . sodium chloride flush  3 mL Intravenous Q12H  . sodium chloride flush  3 mL Intravenous Q12H   Continuous Infusions: . sodium chloride    . sodium chloride     PRN Meds: sodium chloride, sodium chloride, acetaminophen, ondansetron (ZOFRAN) IV, sodium chloride flush, sodium chloride flush   Vital Signs    Vitals:   09/06/20 0530 09/06/20 0719 09/06/20 1116 09/06/20 1120  BP: 106/77 (!) 160/123 (!) 75/54   Pulse: (!) 126 88  74  Resp: 18 20  (!) 26  Temp: 98 F (36.7 C) 98.5 F (36.9 C)    TempSrc: Oral Oral    SpO2: 95% 97%  96%  Weight: 86.5 kg     Height:        Intake/Output Summary (Last 24 hours) at 09/06/2020 1158 Last data filed at 09/06/2020 0523 Gross per 24 hour  Intake 843 ml  Output 1150 ml  Net -307 ml   Last 3 Weights 09/06/2020 09/05/2020 09/04/2020  Weight (lbs) 190 lb 11.2 oz 191 lb 8 oz 190 lb 4.8 oz  Weight (kg)  86.501 kg 86.864 kg 86.32 kg     Telemetry    Atrial fib rates 80s-90s presently - Personally Reviewed  Physical Exam   GEN: No acute distress.  HEENT: Normocephalic, atraumatic, sclera non-icteric. Neck: No JVD or bruits. Cardiac: Irregularly irregular rhythm, rate controlled, 2/6 murmur LSB.   Respiratory: Increased WOB but not in acute extremis, decreased BS at bases R>L GI: Soft, nontender, non-distended, BS +x 4. MS: no deformity. Extremities: No clubbing or cyanosis. No edema. Distal pedal pulses are 2+ and equal bilaterally. Neuro:  AAOx3. Follows commands. No focal deficits. Psych:  Responds to questions appropriately with a normal affect.  Labs    High Sensitivity Troponin:   Recent Labs  Lab 09/03/20 1040 09/03/20 1433 09/03/20 1803  TROPONINIHS 98* 101* 98*      Cardiac EnzymesNo results for input(s): TROPONINI in the last 168 hours. No results for input(s): TROPIPOC in the last 168 hours.   Chemistry Recent Labs  Lab 09/04/20 0414 09/04/20 1054 09/04/20 1118 09/05/20 0413 09/06/20 0452  NA 134*   < > 137 133* 135  K 4.0   < > 3.9 4.2 4.5  CL 99  --   --  99 101  CO2 24  --   --  26 26  GLUCOSE 75  --   --  116* 184*  BUN 49*  --   --  79* 57*  CREATININE 1.63*  --   --  1.27* 1.14  CALCIUM 8.5*  --   --  8.0* 8.2*  GFRNONAA 45*  --   --  >60 >60  ANIONGAP 11  --   --  8 8   < > = values in this interval not displayed.     Hematology Recent Labs  Lab 09/04/20 0414 09/04/20 1054 09/04/20 1118 09/05/20 0413 09/06/20 0452  WBC 7.9  --   --  8.2 11.0*  RBC 4.35  --   --  3.78* 3.25*  HGB 13.4   < > 12.9* 11.6* 9.9*  HCT 40.8   < > 38.0* 35.7* 30.7*  MCV 93.8  --   --  94.4 94.5  MCH 30.8  --   --  30.7 30.5  MCHC 32.8  --   --  32.5 32.2  RDW 14.3  --   --  13.9 13.6  PLT 312  --   --  297 280   < > = values in this interval not displayed.    BNP Recent Labs  Lab 09/03/20 1433  BNP 1,202.7*     DDimer No results for input(s):  DDIMER in the last 168 hours.   Radiology    DG Chest 2 View  Result Date: 09/05/2020 CLINICAL DATA:  Acute respiratory failure. EXAM: CHEST - 2 VIEW COMPARISON:  CT 09/03/2020.  Chest x-ray 09/03/2020. FINDINGS: Stable cardiomegaly. Bilateral pulmonary infiltrates/edema again noted. No interim change from prior studies. Reference is made to prior CT report of 09/03/2020. No pleural effusion or pneumothorax. Degenerative change thoracic spine. IMPRESSION: 1. Bilateral pulmonary infiltrates/again noted. No interim change from prior studies. Reference is made to prior CT report of 09/03/2020. 2. Stable cardiomegaly. Electronically Signed   By: Marcello Moores  Register   On: 09/05/2020 06:20    Cardiac Studies   [facility] 09/04/20   Dist RCA lesion is 100% stenosed.  Ost LAD to Mid LAD lesion is 20% stenosed.  Mid LAD lesion is 40% stenosed.  Prox RCA lesion is 40% stenosed.   Findings:  Ao = 95/60 (76) LV = 95/14  RA = 12 RV = 60/13 PA = 66/31 (46) PCW = 13 Fick cardiac output/index = 4.1/2.1 PVR = 7.8 WU Ao sat = 99% PA sat =63%, 65%  Assessment: 1. 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 There is a large RV marginal branch that is widely patent 2. EF 60-65% 3. Moderate PAH with elevated PVR and moderately reduced cardiac output  Plan/Discussion:  Will manage CAD medically. Will need w/u for PAH and cor pulmonale .  Glori Bickers, MD  11:51 AM  2D echo 09/03/20  1. There is severe RV enlargement with severely depressed RV systolic  function and moderately elevated PASP. The interventricular septum is  flattened in systole consistent with RV pressure overload. Constellation  of findings highly concerning for  pulmonary embolism. Cardiology team aware and CTA pending.  2. Left ventricular ejection fraction, by estimation, is 55 to 60%. The  left ventricle has normal function. Wall motion difficult to assess due to   poor visualization of the LV endocardium but appears grossly normal. There  is mild concentric left  ventricular hypertrophy. Diastolic function is indeterminant due to atrial  fibrillation. There is the interventricular septum is flattened in  systole, consistent with right ventricular pressure overload.  3. Right ventricular systolic function is severely reduced. The right  ventricular size is severely enlarged. There is moderately elevated  pulmonary artery systolic pressure. The estimated right ventricular  systolic pressure is 41.9 mmHg.  4. Right atrial size was moderately dilated.  5. The mitral valve is grossly normal. Mild mitral valve regurgitation.  6. Tricuspid valve regurgitation is moderate.  7. The aortic valve is tricuspid. There is moderate calcification of the  aortic valve. There is moderate thickening of the aortic valve. Aortic  valve regurgitation is trivial. Mild to moderate aortic valve  sclerosis/calcification is present, without any  evidence of aortic stenosis.  8. The inferior vena cava is dilated in size with <50% respiratory  variability, suggesting right atrial pressure of 15 mmHg.   Comparison(s): No prior Echocardiogram.    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 Was found to be in new onset AF with normal LVEF but severe RV enlargement, severely depressed RV systolic function, moderately elevated PASP and RV pressure overload.   Assessment & Plan    1. Acute hypoxic respiratory failure with new PAH/corpulmonale - Covid + RVP negative - CT angio 4/18 showed no PE but did show PAH, enlarged RV/RA, small bilateral effusions, patchy infiltrates in the R lung, persistent area of opacity in the RML (infiltrate/atx vs lung mass) - LHC 09/04/20 showed chronically occluded distal RCA, EF 60-65%, moderate PAH with elevated PVR and  moderately reduced cardiac output - no proximal obstructive disease to account for RV failure - PCWP 13 so felt unlikely to need more diuresis - dyspnea out of proportion to volume status - per Dr. 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 Margaretann Loveless and Dr. Haroldine Laws - will also get f/u portable CXR today. Avoid resuming diuretics as his residual dyspnea/hypoxia not felt to be related to CHF - addendum: CXR shows persistent bilateral pulmonary infiltrates with progressive infiltrate right upper lung - have relayed to Dr. Carlis Abbott with pulmonology who will plan to see patient - 2nd addendum: next Hgb came back further downtrended to 9.1 - per d/w team, plan stat CT abd/pelvis to rule out [place]. I also called nurse to give her update  2. Essential HTN with significant hypotension this morning - BP this AM 75/54, 250 cc bolus given with 91 systolic then another 622 cc bolus given to 94/68- patient denies any acute complaints currently - check stat labs - trend Hgb - hold off on any antihypertensives (not currently ordered), will add hold parameters to metoprolol  3. CAD with CTO dRCA - low level troponin elevation felt due to demand ischemia - medical management with BB/statin as tolerated - not on ASA due to concomitant Eliquis and also anemia as well  4. Newly recognized atrial fibrillation - continue Eliquis but f/u CBC today - add hold parameters to metoprolol - rates presently controlled - CHF team plans TEE/DCCV tomorrow - Lyda Jester had written orders under sign/held  5. Anemia - order FOBT - baseline Hgb was 13-14 on admission with steady decline 12.9->11.6->9.9 - trend CBC this morning given drop in BP - appreciate IM workup as well - see above  6. AKI - Cr trend 1.46->1.57->1.63->1.27->1.14  - continue to follow  7. Mediastinal adenopathy vs hilar R lung mass  per PCCM note - per pulm notes, needs reimaging  in 1 month and defer bx until more compensated  For questions or updates, please contact [facility] Please consult [email or web address] for contact info under Cardiology/STEMI.  Signed, Charlie Pitter, PA-C 09/06/2020, 11:58 AM    Patient seen and examined with Melina Copa PA-C.  Agree as above, with the following exceptions and changes as noted below.  Patient's respiratory status seems relatively stable but he is hypotensive today and newly anemic which has been downtrending since the patient's admission.  No obvious source of bleeding.. Gen: NAD, CV: iRRR, no murmurs, Lungs: Increased work of breathing, diminished in the bases, Abd: soft, Extrem: Warm, well perfused, no edema, Neuro/Psych: alert and oriented x 3, normal mood and affect. All available labs, radiology testing, previous records reviewed.  Will send for CT abdomen pelvis to rule out retroperitoneal bleed as a source of bleeding.  No reported GI bleeding but FOBT obtained.  Defer to pulmonary and treatment of possible pneumonia as contributor to hypoxic respiratory failure.  Given hypotension he was volume resuscitated today and diuresis has been held.  His pulmonary capillary wedge pressure on cath was relatively normal, suggesting he may not need further aggressive diuresis in hospital.  Discussed with his wife and daughter at the bedside.  Tentatively plan for TEE cardioversion tomorrow which may help his respiratory status if he is in sinus rhythm.  Elouise Munroe, MD 09/06/20 6:02 PM

## 2020-09-06 NOTE — Progress Notes (Incomplete)
NAME:  Gary [name], MRN:  [id number], DOB:  1950-10-15, LOS: 2 ADMISSION DATE:  09/03/2020, CONSULTATION DATE:  09/04/20 REFERRING MD:  Dr. Gwendlyn Deutscher, CHIEF COMPLAINT:  SOB   History of Present Illness:  70 y/o M who presented to [facility] on 4/18 with complaints of shortness of breath.    The patient lives at home with his wife.  He is independent of all ADL's.  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  He is a former smoker - smoked from 1968-2005 off / on, heaviest 1.5 ppd.  He grew up in [place], [place] with coal burning stove.  He has multiple hobbies to include Civil War reenactment with black powder rifles, Actuary, & woodworking.  He denies known history of connective tissue disease within family.  His daughter had Turner's syndrome (deceased at age 11 in 43).  The patient reports he had a sleep study in 2016 which did not warrant intervention.  He also reports a significant nosebleed in 2015 that required balloon packing and additional procedure that sounded like embolization by description for bleeding (per ENT in [place]).  No hx of obstructive sleep apnea (had a negative sleep study in the past), no hx of blood clots.   He reports 3 months prior to presentation he had no shortness of breath with activity.  Patient noted in February 2022 that he began having shortness of breath with exertion and activities that normally were not difficult for him (walking uphill to go to church, taking the dogs out).  He also noted feeling full early in meals.  His wife reports that in the last 2 to 3 weeks he has had significant shortness of breath, decreased p.o. intake.  She noted that on 4/16 and 4/17 he was so short of breath to the point that he did not talk much over the weekend prompting evaluation on 4/18.  He reported difficulty walking into the emergency room.  His wife reported that he had poor  coloring with a grayish appearance.    The patient was admitted per teaching service for further evaluation.  Initially required 4 L of oxygen to maintain oxygen saturations which was quickly escalated to 15 L [place].  Cardiology was consulted with new finding of atrial fibrillation.  Echo evaluation demonstrated severe RV enlargement with severely depressed RV systolic function and moderately elevated PASP, LVEF 55-60%, difficult to assess wall motion abnormalities but appeared grossly normal, mild concentric left ventricular hypertrophy, RV systolic function severely reduced, RV severely enlarged, moderately elevated pulmonary artery pressure with estimated RV systolic pressure of 74.8, RA moderately dilated.  Given echo findings, a CTA of the chest was evaluated and negative for pulmonary embolism but demonstrated enlarged central pulmonary arteries consistent with pulmonary arterial hypertension, enlargement of the right ventricle and right atrium, small bilateral pleural effusions greater on the right and patchy groundglass opacities bilaterally, unresolved right middle lobe opacity.  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  He continued to require 15 L [place] oxygen to maintain saturations above 90.  PCCM consulted for evaluation of hypoxemic respiratory failure  and catheterization findings.    Pertinent  Medical History  Former smoker HTN DM Traumatic crush injury from a tree branch with multiple fractures Epistaxis requiring packing   Significant Hospital Events: Including procedures, antibiotic start and stop dates in addition to other pertinent events   . 4/18  admitted with SOB, echo with elevated right heart pressures, new finding of A. fib . 4/19 R/L heart cath with concern for pulmonary hypertension  Interim History / Subjective:  Down to 6L [place] from 15L [place] yesterday. Not desaturating with walking. Hypotensive episode earlier.   Objective   Blood pressure (!) 75/54, pulse 74, temperature 98.5 F (36.9 C), temperature source Oral, resp. rate (!) 26, height _0  (1.702 m), weight 86.5 kg, SpO2 96 %.        Intake/Output Summary (Last 24 hours) at 09/06/2020 1347 Last data filed at 09/06/2020 0523 Gross per 24 hour  Intake 663 ml  Output 800 ml  Net -137 ml   Filed Weights   09/04/20 0400 09/05/20 0308 09/06/20 0530  Weight: 86.3 kg 86.9 kg 86.5 kg    Examination: General: *** HEENT: [place]/AT, eyes anicteric Neuro: awake, alert, able to sit forward independently. Answering questions appropriately. CV: S1S2, RRR PULM: Breathing comfortably on 15L , minimal air movement, no accessory lung sounds. GI: soft, NT, ND Extremities: No clubbing or cyanosis. Mild LE edema. Skin: No rashes or ecchymoses.  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  New AF noted on admit as well as findings of enlarged pulmonary artery on CTA with background mild ground glass, negative for PE, persistent opacities on R and R>L pleural effusion.  No significant background emphysema.  ECHO showed severe RV enlargement with moderate  pulmonary HTN. Nonsignificant CAD, Elevated R-sided pressures with normal L-sided pressures on RHC. 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  -Mildly elevated CRP and ESR of unknown significance. -HIV Ab, CCP & RF, ANA pending -Wean supplemental O2 as able to maintain SpO2 >90%. Seems to have increased nighttime oxygen requirements raising increased concern for sleep-disordered breathing, but he has refused NIPPV overnight. Needs PSG as an outpatient -Needs OP PFTs to assess for COPD. Could consider starting empiric LABA/LAMA (Anoro) given risk factors. -Con't diuresis. -wean supplemental O2 as able. Anticipate he may require supplemental oxygen at discharge -would only do thoracentesis if persistent despite diuresis -OOB mobility, IS   Atrial Fibrillation  RBBB CAD  -AC and rate control per Cardiology -may required DCCV next month  AKI improving with diuresis -con't to monitor -diuresis  Mediastinal adenopathy vs hilar R lung mass  -needs reimaging in 1 month and would defer biopsy until more compensated    Best practice (right click and "Reselect all SmartList Selections" daily)  Per Primary   Labs   CBC: Recent Labs  Lab 09/03/20 1040 09/04/20 0414 09/04/20 1054 09/04/20 1100 09/04/20 1118 09/05/20 0413 09/06/20 0452 09/06/20 1247  WBC 8.1 7.9  --   --   --  8.2 11.0* 9.7  HGB 14.6 13.4   < > 13.9  12.9* 12.9* 11.6* 9.9* 9.1*  HCT 44.1 40.8   < > 41.0  38.0* 38.0* 35.7* 30.7* 27.8*  MCV 94.0 93.8  --   --   --  94.4 94.5 95.2  PLT 323 312  --   --   --  297 280 283   < > = values in this interval not displayed.    Basic Metabolic Panel: Recent Labs  Lab 09/03/20 1040 09/03/20 1803 09/04/20 0414 09/04/20 1054 09/04/20 1100 09/04/20 1118 09/05/20 0413 09/06/20 0452  NA 134* 137 134* 137 137  139 137 133* 135  K 4.5 4.6 4.0 3.9 4.1  3.7 3.9 4.2 4.5  CL 99 101 99  --   --   --  99 101  CO2 21* 25 24  --   --   --  26 26  GLUCOSE 116* 82 75  --   --   --  116* 184*   BUN 41* 45* 49*  --   --   --  79* 57*  CREATININE 1.46* 1.57* 1.63*  --   --   --  1.27* 1.14  CALCIUM 8.8* 8.7* 8.5*  --   --   --  8.0* 8.2*  MG  --   --  1.8  --   --   --   --  1.6*     Julian Hy, DO 09/06/20 1:47 PM [facility]

## 2020-09-06 NOTE — Progress Notes (Addendum)
Family Medicine Teaching Service Daily Progress Note Intern Pager: [phone]  Patient name: Gary [name] Medical record number: [id number] Date of birth: February 21, 1951 Age: 70 y.o. Gender: male  Primary Care Provider: Jalene Mullet, PA-C Consultants: cardiology, pulmonology  Code Status: Full  Pt Overview and Major Events to Date:  4/18: Admitted   Assessment and Plan: Gary [name] a 70 y.o.malepresenting with worsening dypsnea. PMH is significant forhypertension, DM, depression, history of epistaxisand hyperlipidemia.   Worsening dyspnea Acute hypoxic respiratory failureconcern for new onset CHF Significantly improved dyspnea, currently breathing comfortably on 6 L O2.Noted to have better respiratory status with exertion although felt short of breath when he was getting up off the toilet. CXR notable for bibasilar atelectasis, edema or infiltrates are noted with probably small left pleural effusion with mild cardiomegaly noted.Labs notable for troponin98>101>98.BNP 1202.7.Echonotable for EF 55-60% with mild concentric left ventricular hypertrophy, right atrial size moderately dilated with mild mitral valve regurgitation and moderate tricuspid valve regurgitation. 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 Overall stable weight since admission. About 1.15 L output in the last 24 hours. -cardiologyfollowing, appreciate recommendations and continued involvement: cath and coronary angio performed, CAD will be managed medically -heart failure team following, appreciate continued involvement  -S/pIV lasix 40 mgand 80 mgbid, per cardiology will hold off on diuresis  -monitor I/Os -daily weights - monitorEKG -f/u CXR per cards -continuous pulse ox  -wean O2 as tolerated -goals O2 sat >90% per pulmonology  -consider CTA if persistently tachycardic or  symptomatic for PE -up with assistance -cardiac monitoring -PT/OT eval and treat  New onset atrial fibrillation Currently rate controlled at HR 89, went into RVR overnight. s/p IV metoprolol tartrate 2.5 mg. CHADS VASc score of 3 and HAS BLED score of 1.TSH wnl. -cardiology consulted, appreciate continued involvement and recommendations -metoprolol tartrate 25 mg q8h  -plan for TEE and possible cardioversion 4/22 -eliquis 5 mg bid  -monitor EKG -Continue to monitor rate, rate control as needed  Pulmonary hypertension  Cor pulmonale  Lung Nodule  Noted on most recent echo. Possible mediastinal adenopathy vs hilar right lung mass noted on CTA.  -Pulmonology consulted, recommended continued diuresis and then sildenafil therapy -will need PFTs to assess for COPD and sleep study outpatient per pulmonology  -outpatient pulmonology follow up with Dr. Silas Flood -reimaging in 1 month and biopsy deferred per pulmonology  Hypomagnesemia Mg 1.6. -repletion ordered -am Mg  Hypertension BP106/77 this morning. Home medications include amlodipine 5 mg daily and enalapril-HCTZ 10-25 mg daily. -holdhome amlodipine -initially held home enalapril-HCTZ due to AKI, consider restarting per cards, BP at goal -monitor BP  Normocytic anemia Hgb 9.9 this morning from 11.6, most likely secondary to possible anemia of chronic disease or iron deficiency anemia. No known source of bleeding noted. Of note, started on eliquis yesterday afternoon. Will continue to monitor.  -monitor CBC -f/u ferritin and iron studies -FOBT ordered per cardiology   DM CBG 184.A1c 6.7.Home medications include glipizide 10 mg daily. -hold home meds -sSSI -monitor CBG  AKIin the setting ofCKD stage 3a Likely resolved as patient seems to have returned to baseline.  -monitor BMP  Hyponatremia Resolved, Na 135.  -monitorBMP  History of epistaxis Notes history of epistaxis in 2014 that required  embolism. -no BiPAP  Hyperlipidemia Lipid panel significant for HDL 30. Home medications include atorvastatin 40 mg.  -atorvastatin 40 mg daily   Depression Home meds include bupropion  150 mg bid. -continue home med  FEN/GI: heart healthy PPx: eliquis 5 mg bid    Status is: Inpatient  Remains inpatient appropriate because:Inpatient level of care appropriate due to severity of illness   Dispo: The patient is from: Home              Anticipated d/c is to: Home              Patient currently is not medically stable to d/c.   Difficult to place patient No        Subjective:  Overnight patient went into RVR 110-140s, asymptomatic and was sleeping the entire time per patient. Denies dyspnea and chest pain.   Objective: Temp:  [97.5 F (36.4 C)-98.9 F (37.2 C)] 98 F (36.7 C) (04/21 0530) Pulse Rate:  [69-139] 126 (04/21 0530) Resp:  [16-21] 18 (04/21 0530) BP: (100-115)/(57-88) 106/77 (04/21 0530) SpO2:  [86 %-100 %] 95 % (04/21 0530) Weight:  [86.5 kg] 86.5 kg (04/21 0530) Physical Exam: General: Patient sitting upright in the chair, in no acute distress. Cardiovascular: irregularly irregular, no murmurs or gallops  Respiratory: CTAB, no rales or rhonchi noted, breathing comfortably on 6L O2 Abdomen: soft, nontender, nondistended, abdominal hernia noted, BS+ Extremities: radial and distal pulses strong and equal bilaterally, no LE edema noted bilaterally Psych: mood appropriate  Laboratory: Recent Labs  Lab 09/04/20 0414 09/04/20 1054 09/04/20 1118 09/05/20 0413 09/06/20 0452  WBC 7.9  --   --  8.2 11.0*  HGB 13.4   < > 12.9* 11.6* 9.9*  HCT 40.8   < > 38.0* 35.7* 30.7*  PLT 312  --   --  297 280   < > = values in this interval not displayed.   Recent Labs  Lab 09/04/20 0414 09/04/20 1054 09/04/20 1118 09/05/20 0413 09/06/20 0452  NA 134*   < > 137 133* 135  K 4.0   < > 3.9 4.2 4.5  CL 99  --   --  99 101  CO2 24  --   --  26 26  BUN 49*  --    --  79* 57*  CREATININE 1.63*  --   --  1.27* 1.14  CALCIUM 8.5*  --   --  8.0* 8.2*  GLUCOSE 75  --   --  116* 184*   < > = values in this interval not displayed.      Imaging/Diagnostic Tests: No results found.  Donney Dice, DO 09/06/2020, 6:04 AM PGY-1, [facility] Intern pager: [phone], text pages welcome

## 2020-09-06 NOTE — Progress Notes (Signed)
   09/06/20 0326  Assess: MEWS Score  Temp 98.9 F (37.2 C)  BP 115/80  Pulse Rate (!) 139  ECG Heart Rate (!) 122  Resp 20  SpO2 92 %  O2 Device HFNC  O2 Flow Rate (L/min) 6 L/min  Assess: MEWS Score  MEWS Temp 0  MEWS Systolic 0  MEWS Pulse 2  MEWS RR 0  MEWS LOC 0  MEWS Score 2  MEWS Score Color Yellow  Assess: if the MEWS score is Yellow or Red  Were vital signs taken at a resting state? Yes  Focused Assessment Change from prior assessment (see assessment flowsheet)  Early Detection of Sepsis Score *See Row Information* Low  MEWS guidelines implemented *See Row Information* Yes  Treat  MEWS Interventions Escalated (See documentation below)  Pain Scale 0-10  Pain Score 0  Take Vital Signs  Increase Vital Sign Frequency  Yellow: Q 2hr X 2 then Q 4hr X 2, if remains yellow, continue Q 4hrs  Escalate  MEWS: Escalate Yellow: discuss with charge nurse/RN and consider discussing with provider and RRT  Notify: Charge Nurse/RN  Name of Charge Nurse/RN Notified [name], RN  Date Charge Nurse/RN Notified 09/06/20  Time Charge Nurse/RN Notified 0331  Notify: Provider  Provider Name/Title Dr. Nancy Fetter  Date Provider Notified 09/06/20  Time Provider Notified 0330  Notification Type Page  Notification Reason Change in status (HR sustaining in the 110's-140's)  Provider response En route  Date of Provider Response 09/06/20  Time of Provider Response [phone]  Document  Patient Outcome Stabilized after interventions  Progress note created (see row info) Yes    Dr. Nancy Fetter and Dr. Chauncey Reading came to bedside. New order received for Metoprolol 2.51m IVP. Medications administered. Both Md's remained at bedside Patient tolerated IVP of Metorpolol. HR 90's to low 100's.

## 2020-09-06 NOTE — Progress Notes (Addendum)
Advanced Heart Failure Rounding Note  PCP-Cardiologist: No primary care provider on file.   Subjective:    RHC yesterday w/ normal PCWP, 13.  Earlier today he was hypotensive. Give 500 cc NS bolus with improved BP.    Cr down 1.63>>1.27 <<1.1   Remains in A fib.   Oxygen requirement was reduced to 7  liters.    Feeling a little better.    Objective:   Weight Range: 86.5 kg Body mass index is 29.87 kg/m.   Vital Signs:   Temp:  [97.5 F (36.4 C)-98.9 F (37.2 C)] 98.5 F (36.9 C) (04/21 0719) Pulse Rate:  [74-139] 74 (04/21 1120) Resp:  [16-26] 26 (04/21 1120) BP: (75-160)/(54-123) 75/54 (04/21 1116) SpO2:  [86 %-100 %] 96 % (04/21 1120) Weight:  [86.5 kg] 86.5 kg (04/21 0530) Last BM Date: 09/05/20  Weight change: Filed Weights   09/04/20 0400 09/05/20 0308 09/06/20 0530  Weight: 86.3 kg 86.9 kg 86.5 kg    Intake/Output:   Intake/Output Summary (Last 24 hours) at 09/06/2020 1226 Last data filed at 09/06/2020 0523 Gross per 24 hour  Intake 843 ml  Output 1150 ml  Net -307 ml      Physical Exam   General:   No resp difficulty HEENT: normal Neck: supple. no JVD. Carotids 2+ bilat; no bruits. No lymphadenopathy or thryomegaly appreciated. Cor: PMI nondisplaced. Irregular rate & rhythm. No rubs, gallops or murmurs. Lungs: clear on 6 liters  Abdomen: soft, nontender, nondistended. No hepatosplenomegaly. No bruits or masses. Good bowel sounds. Extremities: no cyanosis, clubbing, rash, edema Neuro: alert & orientedx3, cranial nerves grossly intact. moves all 4 extremities w/o difficulty. Affect pleasant   Telemetry   A fib 70-100s   EKG    No new EKG to review today   Labs    CBC Recent Labs    09/05/20 0413 09/06/20 0452  WBC 8.2 11.0*  HGB 11.6* 9.9*  HCT 35.7* 30.7*  MCV 94.4 94.5  PLT 297 510   Basic Metabolic Panel Recent Labs    09/04/20 0414 09/04/20 1054 09/05/20 0413 09/06/20 0452  NA 134*   < > 133* 135  K 4.0   <  > 4.2 4.5  CL 99  --  99 101  CO2 24  --  26 26  GLUCOSE 75  --  116* 184*  BUN 49*  --  79* 57*  CREATININE 1.63*  --  1.27* 1.14  CALCIUM 8.5*  --  8.0* 8.2*  MG 1.8  --   --  1.6*   < > = values in this interval not displayed.   Liver Function Tests No results for input(s): AST, ALT, ALKPHOS, BILITOT, PROT, ALBUMIN in the last 72 hours. No results for input(s): LIPASE, AMYLASE in the last 72 hours. Cardiac Enzymes No results for input(s): CKTOTAL, CKMB, CKMBINDEX, TROPONINI in the last 72 hours.  BNP: BNP (last 3 results) Recent Labs    09/03/20 1433  BNP 1,202.7*    ProBNP (last 3 results) No results for input(s): PROBNP in the last 8760 hours.   D-Dimer No results for input(s): DDIMER in the last 72 hours. Hemoglobin A1C Recent Labs    09/03/20 1452  HGBA1C 6.7*   Fasting Lipid Panel Recent Labs    09/04/20 0414  CHOL 64  HDL 30*  LDLCALC 20  TRIG 68  CHOLHDL 2.1   Thyroid Function Tests Recent Labs    09/03/20 1803  TSH 2.514    Other  results:   Imaging    No results found.   Medications:     Scheduled Medications: . apixaban  5 mg Oral BID  . atorvastatin  40 mg Oral Daily  . buPROPion  150 mg Oral BID  . insulin aspart  0-9 Units Subcutaneous TID WC  . metoprolol tartrate  25 mg Oral Q8H  . sodium chloride flush  3 mL Intravenous Q12H  . sodium chloride flush  3 mL Intravenous Q12H    Infusions: . sodium chloride    . sodium chloride      PRN Medications: sodium chloride, sodium chloride, acetaminophen, ondansetron (ZOFRAN) IV, sodium chloride flush, sodium chloride flush    Assessment/Plan   1. PAH with cor pulmonale - CT chest 4/22: No PE or ILD - Echo LVEF 60% severe RV dilation and HK - Cath with moderate PAH. PA = 66/31 (46) PCW = 13 Fick = 4.1/2.1 PVR = 7.8 WU - Suspect this is WHO Group 3 PAH due to hypoxic lung disease/OHS/OSA - Will order PFTs (when better compensated) and will need outpatient sleep study -  Check serologies for completeness sake>> in process  - Will need home O2. Oxygen requirement reduced to 6 liters.  - Refer to Pulmonary Rehab - Continue to hold diuretics. Encouraged to drink a little extra today.  -  2. Paroxysmal AF - new onset. Unclear duration  - Rate controlled. On metoprolol  - eliquis 5 mg twice a day.  - Plan for TEE/DC-CV tomorrow.    3. CAD - No chest pain.   -mostly non-obstructive - atorva 40 added  - No ASA with AC  4. R lung mass - will need CT f/u   5. Hypotension  Suspect over diuresed. Improved with IV fluids. Hold diuretics for now.   TEE/DC-CV tomorrow   Length of Stay: 2  Darrick Grinder, NP  09/06/2020, 12:26 PM  Advanced Heart Failure Team Pager [phone] (M-F; 7a - 5p)  Please contact [facility] for night-coverage after hours (5p -7a ) and weekends on [email or web address]  Patient seen and examined with the above-signed Advanced Practice Provider and/or Housestaff. I personally reviewed laboratory data, imaging studies and relevant notes. I independently examined the patient and formulated the important aspects of the plan. I have edited the note to reflect any of my changes or salient points. I have personally discussed the plan with the patient and/or family.  BP low today. Improved with IVF. Remains SOB but oxygent requirements improving. Hgb 13.9 ->-> 11.6 ->-> 9.1. No obvious bleeding. I looked at CT and don;t see obvious RP bleed (await formal report). Need to hold Eliquis for now.  CT with moderate L effusion   General:  Obese male mild tachypnea HEENT: normal Neck: supple. no JVD. Carotids 2+ bilat; no bruits. No lymphadenopathy or thryomegaly appreciated. Cor: PMI nondisplaced. Irregular rate & rhythm. No rubs, gallops or murmurs. Lungs: coarse  Abdomen: soft, nontender, nondistended. No hepatosplenomegaly. No bruits or masses. Good bowel sounds. Extremities: no cyanosis, clubbing, rash, edema Neuro: alert & orientedx3, cranial  nerves grossly intact. moves all 4 extremities w/o difficulty. Affect pleasant  Remains SOB but doubt this is HF. Main issue now seems to be where is he bleeding? CT with no obvious RP bleed on my read. (await formal read). Will guaiac stools. Cycle H/H. Likely need GI to see. Stop Eliquis. Continue amio. Cancel TEE/DC-CV for now.   Glori Bickers, MD  4:59 PM

## 2020-09-06 NOTE — TOC Progression Note (Signed)
Transition of Care [facility]) - Progression Note    Patient Details  Name: Gary [name] MRN: [id number] Date of Birth: 09-Jun-1950  [facility] CM/SW Contact  Zenon Mayo, RN Phone Number: 09/06/2020, 4:08 PM  Clinical Narrative:    From home, for TEE/Cardioversion on Friday, may need home oxygen.  Zach with [employer] following.        Expected Discharge Plan and Services                                                 Social Determinants of Health (SDOH) Interventions    Readmission Risk Interventions No flowsheet data found.

## 2020-09-06 NOTE — Progress Notes (Signed)
RT note. Pt. Refusing CPAP tonight, told pt. To let his RN know if he decides to change his mind, RT will continue to monitor.

## 2020-09-07 ENCOUNTER — Inpatient Hospital Stay (HOSPITAL_COMMUNITY): Payer: Medicare Other

## 2020-09-07 ENCOUNTER — Encounter (HOSPITAL_COMMUNITY)
Admission: EM | Disposition: A | Discharge status: Rehabilitation Facility | Payer: Self-pay | Source: Home / Self Care | Attending: Family Medicine

## 2020-09-07 DIAGNOSIS — D62 Acute posthemorrhagic anemia: Secondary | ICD-10-CM

## 2020-09-07 DIAGNOSIS — D649 Anemia, unspecified: Secondary | ICD-10-CM | POA: Diagnosis not present

## 2020-09-07 DIAGNOSIS — I50813 Acute on chronic right heart failure: Secondary | ICD-10-CM | POA: Diagnosis not present

## 2020-09-07 DIAGNOSIS — K921 Melena: Secondary | ICD-10-CM

## 2020-09-07 DIAGNOSIS — K922 Gastrointestinal hemorrhage, unspecified: Secondary | ICD-10-CM | POA: Diagnosis not present

## 2020-09-07 DIAGNOSIS — I5023 Acute on chronic systolic (congestive) heart failure: Secondary | ICD-10-CM | POA: Diagnosis not present

## 2020-09-07 DIAGNOSIS — I272 Pulmonary hypertension, unspecified: Secondary | ICD-10-CM | POA: Diagnosis not present

## 2020-09-07 DIAGNOSIS — I2721 Secondary pulmonary arterial hypertension: Secondary | ICD-10-CM | POA: Diagnosis not present

## 2020-09-07 DIAGNOSIS — I509 Heart failure, unspecified: Secondary | ICD-10-CM | POA: Diagnosis not present

## 2020-09-07 DIAGNOSIS — I482 Chronic atrial fibrillation, unspecified: Secondary | ICD-10-CM | POA: Diagnosis not present

## 2020-09-07 DIAGNOSIS — J9601 Acute respiratory failure with hypoxia: Secondary | ICD-10-CM | POA: Diagnosis not present

## 2020-09-07 DIAGNOSIS — K269 Duodenal ulcer, unspecified as acute or chronic, without hemorrhage or perforation: Secondary | ICD-10-CM

## 2020-09-07 DIAGNOSIS — K2970 Gastritis, unspecified, without bleeding: Secondary | ICD-10-CM | POA: Diagnosis not present

## 2020-09-07 DIAGNOSIS — I2729 Other secondary pulmonary hypertension: Secondary | ICD-10-CM | POA: Diagnosis not present

## 2020-09-07 DIAGNOSIS — I50810 Right heart failure, unspecified: Secondary | ICD-10-CM | POA: Diagnosis not present

## 2020-09-07 HISTORY — PX: BIOPSY: SHX5522

## 2020-09-07 HISTORY — PX: ESOPHAGOGASTRODUODENOSCOPY: SHX5428

## 2020-09-07 LAB — IRON AND TIBC
Iron: 20 ug/dL — ABNORMAL LOW (ref 45–182)
Saturation Ratios: 8 % — ABNORMAL LOW (ref 17.9–39.5)
TIBC: 244 ug/dL — ABNORMAL LOW (ref 250–450)
UIBC: 224 ug/dL

## 2020-09-07 LAB — POCT I-STAT 7, (LYTES, BLD GAS, ICA,H+H)
Acid-base deficit: 8 mmol/L — ABNORMAL HIGH (ref 0.0–2.0)
Acid-base deficit: 4 mmol/L — ABNORMAL HIGH (ref 0.0–2.0)
Acid-base deficit: 3 mmol/L — ABNORMAL HIGH (ref 0.0–2.0)
Acid-base deficit: 2 mmol/L (ref 0.0–2.0)
Acid-base deficit: 4 mmol/L — ABNORMAL HIGH (ref 0.0–2.0)
Bicarbonate: 22.5 mmol/L (ref 20.0–28.0)
Bicarbonate: 24 mmol/L (ref 20.0–28.0)
Bicarbonate: 23.6 mmol/L (ref 20.0–28.0)
Bicarbonate: 25.8 mmol/L (ref 20.0–28.0)
Bicarbonate: 22.7 mmol/L (ref 20.0–28.0)
Calcium, Ion: 1.17 mmol/L (ref 1.15–1.40)
Calcium, Ion: 1.1 mmol/L — ABNORMAL LOW (ref 1.15–1.40)
Calcium, Ion: 1.11 mmol/L — ABNORMAL LOW (ref 1.15–1.40)
Calcium, Ion: 1.11 mmol/L — ABNORMAL LOW (ref 1.15–1.40)
Calcium, Ion: 1.1 mmol/L — ABNORMAL LOW (ref 1.15–1.40)
HCT: 26 % — ABNORMAL LOW (ref 39.0–52.0)
HCT: 27 % — ABNORMAL LOW (ref 39.0–52.0)
HCT: 25 % — ABNORMAL LOW (ref 39.0–52.0)
HCT: 27 % — ABNORMAL LOW (ref 39.0–52.0)
HCT: 26 % — ABNORMAL LOW (ref 39.0–52.0)
Hemoglobin: 8.8 g/dL — ABNORMAL LOW (ref 13.0–17.0)
Hemoglobin: 9.2 g/dL — ABNORMAL LOW (ref 13.0–17.0)
Hemoglobin: 8.5 g/dL — ABNORMAL LOW (ref 13.0–17.0)
Hemoglobin: 9.2 g/dL — ABNORMAL LOW (ref 13.0–17.0)
Hemoglobin: 8.8 g/dL — ABNORMAL LOW (ref 13.0–17.0)
O2 Saturation: 82 %
O2 Saturation: 97 %
O2 Saturation: 97 %
O2 Saturation: 80 %
O2 Saturation: 96 %
Patient temperature: 99.1
Patient temperature: 99.1
Patient temperature: 99.3
Patient temperature: 38.5
Patient temperature: 38.5
Potassium: 5.3 mmol/L — ABNORMAL HIGH (ref 3.5–5.1)
Potassium: 5.5 mmol/L — ABNORMAL HIGH (ref 3.5–5.1)
Potassium: 4.7 mmol/L (ref 3.5–5.1)
Potassium: 5 mmol/L (ref 3.5–5.1)
Potassium: 4.6 mmol/L (ref 3.5–5.1)
Sodium: 133 mmol/L — ABNORMAL LOW (ref 135–145)
Sodium: 133 mmol/L — ABNORMAL LOW (ref 135–145)
Sodium: 134 mmol/L — ABNORMAL LOW (ref 135–145)
Sodium: 135 mmol/L (ref 135–145)
Sodium: 136 mmol/L (ref 135–145)
TCO2: 25 mmol/L (ref 22–32)
TCO2: 26 mmol/L (ref 22–32)
TCO2: 25 mmol/L (ref 22–32)
TCO2: 28 mmol/L (ref 22–32)
TCO2: 24 mmol/L (ref 22–32)
pCO2 arterial: 76.7 mmHg (ref 32.0–48.0)
pCO2 arterial: 60 mmHg — ABNORMAL HIGH (ref 32.0–48.0)
pCO2 arterial: 53.3 mmHg — ABNORMAL HIGH (ref 32.0–48.0)
pCO2 arterial: 65.4 mmHg (ref 32.0–48.0)
pCO2 arterial: 51.2 mmHg — ABNORMAL HIGH (ref 32.0–48.0)
pH, Arterial: 7.077 — CL (ref 7.350–7.450)
pH, Arterial: 7.212 — ABNORMAL LOW (ref 7.350–7.450)
pH, Arterial: 7.257 — ABNORMAL LOW (ref 7.350–7.450)
pH, Arterial: 7.213 — ABNORMAL LOW (ref 7.350–7.450)
pH, Arterial: 7.263 — ABNORMAL LOW (ref 7.350–7.450)
pO2, Arterial: 66 mmHg — ABNORMAL LOW (ref 83.0–108.0)
pO2, Arterial: 109 mmHg — ABNORMAL HIGH (ref 83.0–108.0)
pO2, Arterial: 111 mmHg — ABNORMAL HIGH (ref 83.0–108.0)
pO2, Arterial: 59 mmHg — ABNORMAL LOW (ref 83.0–108.0)
pO2, Arterial: 104 mmHg (ref 83.0–108.0)

## 2020-09-07 LAB — GLUCOSE, CAPILLARY
Glucose-Capillary: 184 mg/dL — ABNORMAL HIGH (ref 70–99)
Glucose-Capillary: 185 mg/dL — ABNORMAL HIGH (ref 70–99)
Glucose-Capillary: 214 mg/dL — ABNORMAL HIGH (ref 70–99)
Glucose-Capillary: 275 mg/dL — ABNORMAL HIGH (ref 70–99)
Glucose-Capillary: 83 mg/dL (ref 70–99)

## 2020-09-07 LAB — CBC
HCT: 20.1 % — ABNORMAL LOW (ref 39.0–52.0)
HCT: 25.1 % — ABNORMAL LOW (ref 39.0–52.0)
Hemoglobin: 6.7 g/dL — CL (ref 13.0–17.0)
Hemoglobin: 8.3 g/dL — ABNORMAL LOW (ref 13.0–17.0)
MCH: 31.5 pg (ref 26.0–34.0)
MCH: 30.9 pg (ref 26.0–34.0)
MCHC: 33.3 g/dL (ref 30.0–36.0)
MCHC: 33.1 g/dL (ref 30.0–36.0)
MCV: 94.4 fL (ref 80.0–100.0)
MCV: 93.3 fL (ref 80.0–100.0)
Platelets: 232 10*3/uL (ref 150–400)
Platelets: 226 10*3/uL (ref 150–400)
RBC: 2.13 MIL/uL — ABNORMAL LOW (ref 4.22–5.81)
RBC: 2.69 MIL/uL — ABNORMAL LOW (ref 4.22–5.81)
RDW: 13.8 % (ref 11.5–15.5)
RDW: 14.4 % (ref 11.5–15.5)
WBC: 12.6 10*3/uL — ABNORMAL HIGH (ref 4.0–10.5)
WBC: 12.7 10*3/uL — ABNORMAL HIGH (ref 4.0–10.5)
nRBC: 0 % (ref 0.0–0.2)
nRBC: 0 % (ref 0.0–0.2)

## 2020-09-07 LAB — BASIC METABOLIC PANEL
Anion gap: 6 (ref 5–15)
BUN: 64 mg/dL — ABNORMAL HIGH (ref 8–23)
CO2: 23 mmol/L (ref 22–32)
Calcium: 7.5 mg/dL — ABNORMAL LOW (ref 8.9–10.3)
Chloride: 102 mmol/L (ref 98–111)
Creatinine, Ser: 1.14 mg/dL (ref 0.61–1.24)
GFR, Estimated: >60 mL/min (ref >60)
Glucose, Bld: 199 mg/dL — ABNORMAL HIGH (ref 70–99)
Potassium: 4.8 mmol/L (ref 3.5–5.1)
Sodium: 131 mmol/L — ABNORMAL LOW (ref 135–145)

## 2020-09-07 LAB — COOXEMETRY PANEL
Carboxyhemoglobin: 1.3 % (ref 0.5–1.5)
Methemoglobin: 1.2 % (ref 0.0–1.5)
O2 Saturation: 73.6 %
Total hemoglobin: 10.9 g/dL — ABNORMAL LOW (ref 12.0–16.0)

## 2020-09-07 LAB — MRSA PCR SCREENING: MRSA by PCR: NEGATIVE

## 2020-09-07 LAB — PROTIME-INR
INR: 1.5 — ABNORMAL HIGH (ref 0.8–1.2)
Prothrombin Time: 18.1 seconds — ABNORMAL HIGH (ref 11.4–15.2)

## 2020-09-07 LAB — OCCULT BLOOD X 1 CARD TO LAB, STOOL: Fecal Occult Bld: POSITIVE — AB

## 2020-09-07 LAB — MAGNESIUM: Magnesium: 1.7 mg/dL (ref 1.7–2.4)

## 2020-09-07 LAB — PREPARE RBC (CROSSMATCH)

## 2020-09-07 LAB — CYCLIC CITRUL PEPTIDE ANTIBODY, IGG/IGA: CCP Antibodies IgG/IgA: 2 units (ref 0–19)

## 2020-09-07 LAB — FERRITIN: Ferritin: 45 ng/mL (ref 24–336)

## 2020-09-07 LAB — ABO/RH: ABO/RH(D): O POS

## 2020-09-07 SURGERY — ECHOCARDIOGRAM, TRANSESOPHAGEAL
Anesthesia: Monitor Anesthesia Care

## 2020-09-07 SURGERY — EGD (ESOPHAGOGASTRODUODENOSCOPY)
Anesthesia: Moderate Sedation

## 2020-09-07 MED ORDER — PANTOPRAZOLE SODIUM 40 MG IV SOLR: 40.0000 mg | Freq: Once | INTRAVENOUS | Status: DC

## 2020-09-07 MED ORDER — KETAMINE HCL 50 MG/5ML IJ SOSY
PREFILLED_SYRINGE | INTRAMUSCULAR | Status: AC
  Filled 2020-09-07: qty 5

## 2020-09-07 MED ORDER — CHLORHEXIDINE GLUCONATE 0.12% ORAL RINSE (MEDLINE KIT)
15.0000 mL | Freq: Two times a day (BID) | OROMUCOSAL | Status: DC
  Administered 2020-09-07 – 2020-09-11 (×8): 15 mL via OROMUCOSAL

## 2020-09-07 MED ORDER — MIDAZOLAM HCL 2 MG/2ML IJ SOLN
INTRAMUSCULAR | Status: AC
  Administered 2020-09-07: 2 mg
  Filled 2020-09-07: qty 2

## 2020-09-07 MED ORDER — SODIUM CHLORIDE 0.9 % IV SOLN
80.0000 mg | Freq: Once | INTRAVENOUS | Status: AC
  Administered 2020-09-07: 80 mg via INTRAVENOUS
  Filled 2020-09-07: qty 80

## 2020-09-07 MED ORDER — EPINEPHRINE 1 MG/10ML IJ SOSY
PREFILLED_SYRINGE | INTRAMUSCULAR | Status: AC
  Filled 2020-09-07: qty 10

## 2020-09-07 MED ORDER — PROPOFOL 1000 MG/100ML IV EMUL
5.0000 ug/kg/min | INTRAVENOUS | Status: DC
  Administered 2020-09-07: 5 ug/kg/min via INTRAVENOUS
  Administered 2020-09-07: 40 ug/kg/min via INTRAVENOUS
  Administered 2020-09-08: 15 ug/kg/min via INTRAVENOUS
  Administered 2020-09-08: 20 ug/kg/min via INTRAVENOUS
  Administered 2020-09-09: 15 ug/kg/min via INTRAVENOUS
  Administered 2020-09-09: 20 ug/kg/min via INTRAVENOUS
  Administered 2020-09-09 – 2020-09-10 (×3): 15 ug/kg/min via INTRAVENOUS
  Administered 2020-09-11: 10 ug/kg/min via INTRAVENOUS
  Filled 2020-09-07 (×2): qty 100
  Filled 2020-09-07: qty 200
  Filled 2020-09-07: qty 100
  Filled 2020-09-07: qty 200
  Filled 2020-09-07 (×5): qty 100

## 2020-09-07 MED ORDER — FENTANYL 2500MCG IN NS 250ML (10MCG/ML) PREMIX INFUSION
25.0000 ug/h | INTRAVENOUS | Status: DC
  Administered 2020-09-07: 50 ug/h via INTRAVENOUS
  Administered 2020-09-08 – 2020-09-10 (×2): 75 ug/h via INTRAVENOUS
  Filled 2020-09-07 (×3): qty 250

## 2020-09-07 MED ORDER — ORAL CARE MOUTH RINSE
15.0000 mL | OROMUCOSAL | Status: DC
  Administered 2020-09-07 – 2020-09-11 (×37): 15 mL via OROMUCOSAL

## 2020-09-07 MED ORDER — PANTOPRAZOLE SODIUM 40 MG IV SOLR: 40.0000 mg | Freq: Two times a day (BID) | INTRAVENOUS | Status: DC

## 2020-09-07 MED ORDER — ETOMIDATE 2 MG/ML IV SOLN
INTRAVENOUS | Status: AC
  Filled 2020-09-07: qty 20

## 2020-09-07 MED ORDER — ETOMIDATE 2 MG/ML IV SOLN
INTRAVENOUS | Status: AC
  Administered 2020-09-07: 20 mg
  Filled 2020-09-07: qty 20

## 2020-09-07 MED ORDER — FENTANYL CITRATE (PF) 100 MCG/2ML IJ SOLN: 25.0000 ug | Freq: Once | INTRAMUSCULAR | Status: AC

## 2020-09-07 MED ORDER — FENTANYL CITRATE (PF) 100 MCG/2ML IJ SOLN
INTRAMUSCULAR | Status: AC
  Administered 2020-09-07: 100 ug via INTRAVENOUS
  Filled 2020-09-07: qty 2

## 2020-09-07 MED ORDER — MIDAZOLAM HCL (PF) 5 MG/ML IJ SOLN
INTRAMUSCULAR | Status: AC
  Filled 2020-09-07: qty 2

## 2020-09-07 MED ORDER — NOREPINEPHRINE 16 MG/250ML-% IV SOLN
0.0000 ug/min | INTRAVENOUS | Status: DC
  Administered 2020-09-07: 2 ug/min via INTRAVENOUS
  Administered 2020-09-08: 30 ug/min via INTRAVENOUS
  Administered 2020-09-08: 35 ug/min via INTRAVENOUS
  Administered 2020-09-08: 30 ug/min via INTRAVENOUS
  Administered 2020-09-09: 20 ug/min via INTRAVENOUS
  Administered 2020-09-10: 6 ug/min via INTRAVENOUS
  Filled 2020-09-07 (×6): qty 250

## 2020-09-07 MED ORDER — SODIUM CHLORIDE 0.9 % IV SOLN
250.0000 mL | INTRAVENOUS | Status: DC
  Administered 2020-09-12: 250 mL via INTRAVENOUS

## 2020-09-07 MED ORDER — STERILE WATER FOR INJECTION IJ SOLN
50.0000 ng/kg/min | INTRAVENOUS | Status: DC
  Administered 2020-09-07 – 2020-09-08 (×3): 50 ng/kg/min via RESPIRATORY_TRACT
  Filled 2020-09-07 (×5): qty 5

## 2020-09-07 MED ORDER — ROCURONIUM BROMIDE 10 MG/ML (PF) SYRINGE
PREFILLED_SYRINGE | INTRAVENOUS | Status: AC
  Filled 2020-09-07: qty 10

## 2020-09-07 MED ORDER — ROCURONIUM BROMIDE 10 MG/ML (PF) SYRINGE
PREFILLED_SYRINGE | INTRAVENOUS | Status: AC
  Administered 2020-09-07: 100 mg
  Filled 2020-09-07: qty 10

## 2020-09-07 MED ORDER — FENTANYL BOLUS VIA INFUSION
25.0000 ug | INTRAVENOUS | Status: DC | PRN
  Filled 2020-09-07: qty 100

## 2020-09-07 MED ORDER — SODIUM CHLORIDE 0.9 % IV SOLN
8.0000 mg/h | INTRAVENOUS | Status: DC
  Administered 2020-09-07 – 2020-09-10 (×6): 8 mg/h via INTRAVENOUS
  Filled 2020-09-07 (×10): qty 80

## 2020-09-07 MED ORDER — SODIUM CHLORIDE 0.9% IV SOLUTION: Freq: Once | INTRAVENOUS | Status: DC

## 2020-09-07 MED ORDER — FUROSEMIDE 10 MG/ML IJ SOLN: 40.0000 mg | Freq: Once | INTRAMUSCULAR | Status: DC

## 2020-09-07 MED ORDER — CHLORHEXIDINE GLUCONATE CLOTH 2 % EX PADS
6.0000 | MEDICATED_PAD | Freq: Every day | CUTANEOUS | Status: DC
  Administered 2020-09-09: 6 via TOPICAL

## 2020-09-07 MED ORDER — MAGNESIUM SULFATE 2 GM/50ML IV SOLN
2.0000 g | Freq: Once | INTRAVENOUS | Status: AC
  Administered 2020-09-07: 2 g via INTRAVENOUS
  Filled 2020-09-07: qty 50

## 2020-09-07 MED ORDER — SODIUM CHLORIDE 0.9 % IV SOLN: INTRAVENOUS | Status: DC

## 2020-09-07 MED ORDER — ATORVASTATIN CALCIUM 40 MG PO TABS
40.0000 mg | ORAL_TABLET | Freq: Every day | ORAL | Status: DC
  Administered 2020-09-08 – 2020-09-12 (×5): 40 mg
  Filled 2020-09-07 (×5): qty 1

## 2020-09-07 NOTE — Progress Notes (Signed)
Progress Note  Patient Name: Gary [name] Date of Encounter: 09/07/2020  Primary Cardiologist: Elouise Munroe, MD  Subjective   Feels washed out, low spirits. No CP. Stable SOB.  Inpatient Medications    Scheduled Meds: . sodium chloride   Intravenous Once  . atorvastatin  40 mg Oral Daily  . buPROPion  150 mg Oral BID  . insulin aspart  0-9 Units Subcutaneous TID WC  . metoprolol tartrate  25 mg Oral Q8H   Continuous Infusions: . sodium chloride     PRN Meds: sodium chloride, acetaminophen, ondansetron (ZOFRAN) IV   Vital Signs    Vitals:   09/06/20 2058 09/07/20 0000 09/07/20 0100 09/07/20 0517  BP: (!) 90/57 94/60  (!) 92/58  Pulse: (!) 108 (!) 105  99  Resp:  20  19  Temp:  98 F (36.7 C)  98.3 F (36.8 C)  TempSrc:  Oral  Oral  SpO2:  90%  92%  Weight:   86.6 kg   Height:        Intake/Output Summary (Last 24 hours) at 09/07/2020 0800 Last data filed at 09/07/2020 0100 Gross per 24 hour  Intake 360 ml  Output 975 ml  Net -615 ml   Last 3 Weights 09/07/2020 09/06/2020 09/05/2020  Weight (lbs) 190 lb 14.4 oz 190 lb 11.2 oz 191 lb 8 oz  Weight (kg) 86.592 kg 86.501 kg 86.864 kg     Telemetry    afib rate 110- Personally Reviewed  Physical Exam   GEN: No acute distress.   Neck: No JVD Cardiac: irregular tachycardia, no murmurs, rubs, or gallops.  Respiratory: Clear to auscultation bilaterally. GI: Soft, nontender, non-distended  MS: No edema; No deformity. Neuro:  Nonfocal  Psych: Normal affect   Labs    High Sensitivity Troponin:   Recent Labs  Lab 09/03/20 1040 09/03/20 1433 09/03/20 1803  TROPONINIHS 98* 101* 98*      Cardiac EnzymesNo results for input(s): TROPONINI in the last 168 hours. No results for input(s): TROPIPOC in the last 168 hours.   Chemistry Recent Labs  Lab 09/06/20 0452 09/06/20 1247 09/07/20 0537  NA 135 131* 131*  K 4.5 4.6 4.8  CL 101 101 102  CO2 26 25 23   GLUCOSE 184* 162* 199*  BUN 57* 67* 64*   CREATININE 1.14 1.12 1.14  CALCIUM 8.2* 7.6* 7.5*  GFRNONAA >60 >60 >60  ANIONGAP 8 5 6      Hematology Recent Labs  Lab 09/06/20 0452 09/06/20 1247 09/07/20 0537  WBC 11.0* 9.7 12.6*  RBC 3.25* 2.92* 2.13*  HGB 9.9* 9.1* 6.7*  HCT 30.7* 27.8* 20.1*  MCV 94.5 95.2 94.4  MCH 30.5 31.2 31.5  MCHC 32.2 32.7 33.3  RDW 13.6 13.9 13.8  PLT 280 283 232    BNP Recent Labs  Lab 09/03/20 1433  BNP 1,202.7*     DDimer No results for input(s): DDIMER in the last 168 hours.   Radiology    CT ABDOMEN PELVIS WO CONTRAST  Result Date: 09/06/2020 CLINICAL DATA:  New onset anemia. Patient on anticoagulation. Rule out retroperitoneal bleed. EXAM: CT ABDOMEN AND PELVIS WITHOUT CONTRAST TECHNIQUE: Multidetector CT imaging of the abdomen and pelvis was performed following the standard protocol without IV contrast. COMPARISON:  February 12, 2020 and September 03, 2020 FINDINGS: Lower chest: Small to moderate RIGHT effusion similar to recent CT of the chest Basilar septal thickening with similar appearance when compared to recent imaging. Airspace disease in the RIGHT lower chest  and LEFT lung base with improvement. Resolution of LEFT-sided pleural fluid. Heart size enlarged. Low-attenuation in cardiac chambers compatible with given history of anemia. Hepatobiliary: No focal, suspicious hepatic lesion on noncontrast imaging. No pericholecystic stranding. Pancreas: Pancreatic atrophy.  No signs of inflammation. Spleen: Spleen normal size and contour. Adrenals/Urinary Tract: Adrenal glands are normal. Mild perinephric stranding bilaterally. No sign of hydronephrosis. Urinary bladder is partially collapsed. No nephrolithiasis. No ureteral calculi. Stomach/Bowel: Fluid in the stomach no perigastric stranding. Small bowel is normal caliber. Small-bowel now contained within a moderately large ventral hernia with moderate rectus diastasis at the site of the hernia. Diastasis at 4.9 cm. No sign of bowel  obstruction related to these findings. Fat with herniated through this area on the prior study. Colonic diverticulosis. No sign of diverticulitis. Appendix not visualized though no secondary signs to suggest acute appendicitis. Vascular/Lymphatic: Calcified atheromatous plaque of the abdominal aorta. No aneurysmal dilation. Smooth contour of the IVC. There is no gastrohepatic or hepatoduodenal ligament lymphadenopathy. No retroperitoneal or mesenteric lymphadenopathy. No pelvic sidewall lymphadenopathy. Streak artifact from sacroiliac screws limit assessment. Reproductive: Unremarkable on limited assessment. Other: Bilateral fat containing inguinal hernias and moderately large ventral hernia containing bowel and fat without obstruction. Musculoskeletal: No expansion of abdominal wall musculature. No expansion of the psoas musculature or other visualized muscular structures. No signs to indicate retroperitoneal hematoma. ORIF of previous sacroiliac diastasis. No destructive bone finding or acute bone process. IMPRESSION: 1. No signs of retroperitoneal hematoma. 2. Small to moderate RIGHT effusion similar to recent CT of the chest. Septal thickening may be indicative of volume overload or signs of heart failure. Areas of ground-glass and airspace disease with some improvement may be indicative of improving superimposed pneumonitis. 3. Airspace disease in the RIGHT lower chest and LEFT lung base with improvement. 4. Moderately large ventral hernia containing bowel and fat without obstruction. 5. Colonic diverticulosis without evidence of diverticulitis. 6. Aortic atherosclerosis. Electronically Signed   By: Zetta Bills M.D.   On: 09/06/2020 16:59   DG CHEST PORT 1 VIEW  Result Date: 09/06/2020 CLINICAL DATA:  Hypotension.  Shortness of breath. EXAM: PORTABLE CHEST 1 VIEW COMPARISON:  Chest x-ray 09/05/2020. FINDINGS: Stable cardiomegaly. Persistent bilateral pulmonary infiltrates with progressive infiltrate  right upper lung. Tiny bilateral pleural effusions cannot be excluded. No pneumothorax. Degenerative changes scoliosis thoracic spine. IMPRESSION: 1.  Stable cardiomegaly. 2. Persistent bilateral pulmonary infiltrates with progressive infiltrate right upper lung. Tiny bilateral pleural effusions cannot be excluded. Electronically Signed   By: Marcello Moores  Register   On: 09/06/2020 12:43    Cardiac Studies   [facility] 09/04/20   Dist RCA lesion is 100% stenosed.  Ost LAD to Mid LAD lesion is 20% stenosed.  Mid LAD lesion is 40% stenosed.  Prox RCA lesion is 40% stenosed.   Findings:  Ao = 95/60 (76) LV = 95/14  RA = 12 RV = 60/13 PA = 66/31 (46) PCW = 13 Fick cardiac output/index = 4.1/2.1 PVR = 7.8 WU Ao sat = 99% PA sat =63%, 65%  Assessment: 1. 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 There is a large RV marginal branch that is widely patent 2. EF 60-65% 3. Moderate PAH with elevated PVR and moderately reduced cardiac output  Plan/Discussion:  Will manage CAD medically. Will need w/u for PAH and cor pulmonale .  Glori Bickers, MD  11:51 AM  2D echo 09/03/20  1. There is severe RV enlargement with severely depressed RV systolic  function and moderately elevated PASP. The interventricular septum is  flattened in systole consistent with RV pressure overload. Constellation  of findings highly concerning for  pulmonary embolism. Cardiology team aware and CTA pending.  2. Left ventricular ejection fraction, by estimation, is 55 to 60%. The  left ventricle has normal function. Wall motion difficult to assess due to  poor visualization of the LV endocardium but appears grossly normal. There  is mild concentric left  ventricular hypertrophy. Diastolic function is indeterminant due to atrial  fibrillation. There is the interventricular septum is flattened in  systole, consistent with right ventricular pressure overload.   3. Right ventricular systolic function is severely reduced. The right  ventricular size is severely enlarged. There is moderately elevated  pulmonary artery systolic pressure. The estimated right ventricular  systolic pressure is 23.9 mmHg.  4. Right atrial size was moderately dilated.  5. The mitral valve is grossly normal. Mild mitral valve regurgitation.  6. Tricuspid valve regurgitation is moderate.  7. The aortic valve is tricuspid. There is moderate calcification of the  aortic valve. There is moderate thickening of the aortic valve. Aortic  valve regurgitation is trivial. Mild to moderate aortic valve  sclerosis/calcification is present, without any  evidence of aortic stenosis.  8. The inferior vena cava is dilated in size with <50% respiratory  variability, suggesting right atrial pressure of 15 mmHg.   Comparison(s): No prior Echocardiogram.    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 Was found to be in new onset AF with normal LVEF but severe RV enlargement, severely depressed RV systolic function, moderately elevated PASP and RV pressure overload.   Assessment & Plan    Acute hypoxic respiratory failure PHTN RHF Acute anemia Atrial fibrillation, new diagnosis - respiratory status likely secondary to OHS/OSA yet undiagnosed, will need outpatient workup.  - respiratory failure complicated by afib and anemia - now Hb 6.7 today. Recommend transfusion to goal of >7.  - no clear source of bleeding.  - ANA panel shows positive ANA, negative ENA panel - FOBT positive per patient, consider GI evaluation. - not a good candidate for attempt at cardioversion today given concerns for possible bleeding.  D/w primary team.  Hypotension - likely secondary to anemia, holding metoprolol for hypotension.  - responded somewhat to fluid bolus yesterday, may do  better with blood today.    CAD with occusion of dRCA - low level troponin elevation felt due to demand ischemia - medical management with BB/statin as tolerated - holding eliquis due to anemia.  AKI - Cr trend 1.46->1.57->1.63->1.27->1.12->1.14 - improved overall  Mediastinal adenopathy vs hilar R lung mass per PCCM note - per pulm notes, needs reimaging in 1 month and defer bx until more compensated  For questions or updates, please contact [facility] Please consult [email or web address] for contact info under Cardiology/STEMI.  Signed, Elouise Munroe, MD 09/07/2020, 8:00 AM

## 2020-09-07 NOTE — Progress Notes (Addendum)
Family Medicine Teaching Service Daily Progress Note Intern Pager: [phone]  Patient name: Gary [name] Medical record number: [id number] Date of birth: November 26, 1950 Age: 70 y.o. Gender: male  Primary Care Provider: Jalene Mullet, PA-C Consultants: cardiology, pulmonology  Code Status: Full  Pt Overview and Major Events to Date:  4/18: Admitted   Assessment and Plan: Gary [name] a 70 y.o.malepresenting with worseningdypsnea. PMH is significant forhypertension, DM, depression, history of epistaxisand hyperlipidemia.   Worsening dyspnea Acute hypoxic respiratory failureconcern for new onset CHF Denies dyspnea, breathing comfortably on 9L O2 this morning. Noted to have better respiratory status with exertion although felt short of breath when he was getting up off the toilet.CXR notable for bibasilar atelectasis, edema or infiltrates are noted with probably small left pleural effusion with mild cardiomegaly noted.Labs notable for troponin98>101>98.BNP 1202.7.Echonotable for EF 55-60% with mild concentric left ventricular hypertrophy, right atrial size moderately dilated with mild mitral valve regurgitation and moderate tricuspid valve regurgitation.Right/left heart cath and coronary angio demonstrated chronically occluded distal RCA but otherwise non-obstructive CAD, EF 60-65% and moderately reduced cardiac output. Overall stable weight since admission. About 615 mL output in the last 24 hours. -cardiologyfollowing, appreciate recommendationsand continued involvement: cath and coronary angio performed, CAD will be managed medically -heart failure team following, appreciate continued involvement -S/pIV lasix 40 mgand 80 mgbid, per cardiology will hold off on diuresis given concern for hypotension  -monitor I/Os -daily weights - monitorEKG -continuous pulse ox  -wean O2 as tolerated -goals O2 sat >90% per pulmonology  -consider CTA if persistently tachycardic or  symptomatic for PE -up with assistance -cardiac monitoring -PT/OT eval and treat  Normocytic anemia Hgb 6.7 this morning which is steadily declining most likely secondary to active GI bleed. Noted have have melena this morning. Denies hematemesis and hemoptysis. FOBT positive. CT abdomen/pelvis recommended by pulmonology that demonstrated no signs of retroperitoneal hematoma.  -GI consulted, spoke to [place] this morning. Appreciate recommendations  -2 units pRBC ordered -f/u post H&H  New onset atrial fibrillation Currently rate controlled at HR 89, went into RVR overnight. s/p IV metoprolol tartrate 2.5 mg. CHADS VASc score of 3 and HAS BLED score of 1.TSH wnl. -cardiology consulted, appreciatecontinued involvement and recommendations -metoprolol tartrate 25 mg q8h with parameters  -TEE and cardioversion when able  -held eliquis 5 mg bid  -monitor EKG -Continue to monitor rate, rate control as needed  Pulmonary hypertension  Cor pulmonale  Lung Nodule  Denies any dyspnea this morning. Noted on most recent echo. Possible mediastinal adenopathy vs hilar right lung mass noted on CTA.  -Pulmonology following, appreciate continued involvement and recs -will need PFTs to assess for COPD and sleep study outpatient per pulmonology -outpatient pulmonology follow up with Dr. Silas Flood -reimaging in 1 month and biopsy deferred per pulmonology  Hypertension BP94/60this morning. Home medications include amlodipine 5 mg daily and enalapril-HCTZ 10-25 mg daily. -holdhome amlodipine -hold home enalapril-HCTZ  -monitor BP  DM Most recent CBG177.A1c 6.7.Home medications include glipizide 10 mg daily. -hold home meds -sSSI -monitor CBG  AKIin the setting ofCKD stage 3a According to most recent labs appears to be resolved and patient Cr returned back to baseline. -daily BMP  History of epistaxis Notes history of epistaxis in 2014 that required embolism. -no  BiPAP  Hyperlipidemia Lipid panel significant for HDL 30. Home medications include atorvastatin40 mg.  -atorvastatin 40 mg daily  Depression Home meds include bupropion 150 mg bid. -continue home med   FEN/GI: NPO  PPx: SCDs given potential bleed  Status is: Inpatient  Remains inpatient appropriate because:Ongoing diagnostic testing needed not appropriate for outpatient work up   Dispo: The patient is from: Home              Anticipated d/c is to: Home              Patient currently is not medically stable to d/c.   Difficult to place patient No        Subjective:  No acute overnight events reported. Denies chest pain, dyspnea, dizziness and weakness. States that he occasionally gets exertion with increased activity the faster he moves but has not happened while he goes to and from the restroom. Endorses dark stool this morning witnessed by the nurse. Denies blood in the toilet blood, no known history of hemorrhoids that patient is aware of. Dr. Margaretann Loveless present initially to confirm cardioversion cancellation given possible GI bleed, metoprolol ordered with parameters. Patient has never had a colonoscopy before, discussed that this or an EGD may be the next intervention which patient was initially not agreeable but now is amendable to this. Discussed the need to hold off on cardioversion at this time as well. Reassurance provided. Patient is agreeable to current treatment plan.   Objective: Temp:  [97.5 F (36.4 C)-98.5 F (36.9 C)] 98 F (36.7 C) (04/22 0000) Pulse Rate:  [74-126] 105 (04/22 0000) Resp:  [18-26] 20 (04/22 0000) BP: (75-160)/(50-123) 94/60 (04/22 0000) SpO2:  [90 %-100 %] 90 % (04/22 0000) Weight:  [86.5 kg-86.6 kg] 86.6 kg (04/22 0100) Physical Exam: General: Patient sitting on the side of the bed, pale appearing, in no acute distress. Cardiovascular: irregularly irregular, no murmurs or gallops auscultated  Respiratory: CTAB, no rales or rhonchi  noted, breathing comfortably on 9L O2 Abdomen: soft, nontender, nondistended, presence of bowel sounds  Extremities: radial and distal pulses strong and equal bilaterally, no LE edema noted bilaterally Psych: mood appropriate  Laboratory: Recent Labs  Lab 09/05/20 0413 09/06/20 0452 09/06/20 1247  WBC 8.2 11.0* 9.7  HGB 11.6* 9.9* 9.1*  HCT 35.7* 30.7* 27.8*  PLT 297 280 283   Recent Labs  Lab 09/05/20 0413 09/06/20 0452 09/06/20 1247  NA 133* 135 131*  K 4.2 4.5 4.6  CL 99 101 101  CO2 26 26 25   BUN 79* 57* 67*  CREATININE 1.27* 1.14 1.12  CALCIUM 8.0* 8.2* 7.6*  GLUCOSE 116* 184* 162*      Imaging/Diagnostic Tests: CT ABDOMEN PELVIS WO CONTRAST  Result Date: 09/06/2020 CLINICAL DATA:  New onset anemia. Patient on anticoagulation. Rule out retroperitoneal bleed. EXAM: CT ABDOMEN AND PELVIS WITHOUT CONTRAST TECHNIQUE: Multidetector CT imaging of the abdomen and pelvis was performed following the standard protocol without IV contrast. COMPARISON:  February 12, 2020 and September 03, 2020 FINDINGS: Lower chest: Small to moderate RIGHT effusion similar to recent CT of the chest Basilar septal thickening with similar appearance when compared to recent imaging. Airspace disease in the RIGHT lower chest and LEFT lung base with improvement. Resolution of LEFT-sided pleural fluid. Heart size enlarged. Low-attenuation in cardiac chambers compatible with given history of anemia. Hepatobiliary: No focal, suspicious hepatic lesion on noncontrast imaging. No pericholecystic stranding. Pancreas: Pancreatic atrophy.  No signs of inflammation. Spleen: Spleen normal size and contour. Adrenals/Urinary Tract: Adrenal glands are normal. Mild perinephric stranding bilaterally. No sign of hydronephrosis. Urinary bladder is partially collapsed. No nephrolithiasis. No ureteral calculi. Stomach/Bowel: Fluid in the stomach no perigastric stranding. Small bowel is normal caliber. Small-bowel now contained  within a moderately large ventral hernia with moderate rectus diastasis at the site of the hernia. Diastasis at 4.9 cm. No sign of bowel obstruction related to these findings. Fat with herniated through this area on the prior study. Colonic diverticulosis. No sign of diverticulitis. Appendix not visualized though no secondary signs to suggest acute appendicitis. Vascular/Lymphatic: Calcified atheromatous plaque of the abdominal aorta. No aneurysmal dilation. Smooth contour of the IVC. There is no gastrohepatic or hepatoduodenal ligament lymphadenopathy. No retroperitoneal or mesenteric lymphadenopathy. No pelvic sidewall lymphadenopathy. Streak artifact from sacroiliac screws limit assessment. Reproductive: Unremarkable on limited assessment. Other: Bilateral fat containing inguinal hernias and moderately large ventral hernia containing bowel and fat without obstruction. Musculoskeletal: No expansion of abdominal wall musculature. No expansion of the psoas musculature or other visualized muscular structures. No signs to indicate retroperitoneal hematoma. ORIF of previous sacroiliac diastasis. No destructive bone finding or acute bone process. IMPRESSION: 1. No signs of retroperitoneal hematoma. 2. Small to moderate RIGHT effusion similar to recent CT of the chest. Septal thickening may be indicative of volume overload or signs of heart failure. Areas of ground-glass and airspace disease with some improvement may be indicative of improving superimposed pneumonitis. 3. Airspace disease in the RIGHT lower chest and LEFT lung base with improvement. 4. Moderately large ventral hernia containing bowel and fat without obstruction. 5. Colonic diverticulosis without evidence of diverticulitis. 6. Aortic atherosclerosis. Electronically Signed   By: Zetta Bills M.D.   On: 09/06/2020 16:59   DG CHEST PORT 1 VIEW  Result Date: 09/06/2020 CLINICAL DATA:  Hypotension.  Shortness of breath. EXAM: PORTABLE CHEST 1 VIEW  COMPARISON:  Chest x-ray 09/05/2020. FINDINGS: Stable cardiomegaly. Persistent bilateral pulmonary infiltrates with progressive infiltrate right upper lung. Tiny bilateral pleural effusions cannot be excluded. No pneumothorax. Degenerative changes scoliosis thoracic spine. IMPRESSION: 1.  Stable cardiomegaly. 2. Persistent bilateral pulmonary infiltrates with progressive infiltrate right upper lung. Tiny bilateral pleural effusions cannot be excluded. Electronically Signed   By: Marcello Moores  Register   On: 09/06/2020 12:43    Donney Dice, DO 09/07/2020, 4:51 AM PGY-1, [facility] Intern pager: [phone], text pages welcome

## 2020-09-07 NOTE — Progress Notes (Addendum)
Spoke with RN to request two large bore IVs in this patient. 2 units pRBCs ordered, released and on the floor.   Will monitor closely. Plan for transfer to progressive.   Please page [phone] with any concerns.   ADDENDUM Ordering STAT CT chest/abd/pelv without contrast.   Ezequiel Essex, MD

## 2020-09-07 NOTE — Progress Notes (Signed)
Pt assisted to the bathroom very weak is am. Increased 02 to 8l HFNC. Dark stools noted and send down for occult blood. Explained to pt and spouse on the phone the plan of care.

## 2020-09-07 NOTE — Progress Notes (Signed)
PT Cancellation Note  Patient Details Name: Gary [name] MRN: [id number] DOB: Apr 18, 1951   Cancelled Treatment:    Reason Eval/Treat Not Completed: (P) Medical issues which prohibited therapy (RN defer, pt awaiting blood products and plan likely for transfer to higher level of care.)   [name] [facility] 09/07/2020, 10:38 AM

## 2020-09-07 NOTE — Progress Notes (Signed)
Initial Nutrition Assessment  DOCUMENTATION CODES:   Not applicable  INTERVENTION:   Initiate Vital 1.5 @ 55 ml/hr via OGT (1320 ml daily)  45 ml Prosource TF BID    Tube feeding regimen provides 2060 kcal (94% of needs), 111 grams of protein, and 1009 ml of H2O.   TF + propofol provides 2126 kcals (100% of needs)  NUTRITION DIAGNOSIS:   Inadequate oral intake related to inability to eat as evidenced by NPO status.  GOAL:   Patient will meet greater than or equal to 90% of their needs  MONITOR:   Vent status,Labs,Weight trends,Skin,I & O's  REASON FOR ASSESSMENT:   Ventilator    ASSESSMENT:   70 year old with history of hypertension, dyslipidemia, and type 2 diabetes presenting with acute hypoxemic respiratory failure likely due to new heart failure and atrial fibrillation with rapid ventricular rates.  Pt admitted with acute hypoxic respiratory failure secondary to new onset CHF and a-fib.   4/19- s/p rt/lt heart cath with coronary angiography 4/22- episode of melena, transferred to ICU, intubated  Patient is currently intubated on ventilator support MV: 15.5 L/min Temp (24hrs), Avg:98.9 F (37.2 C), Min:98 F (36.7 C), Max:99.2 F (37.3 C)  Propofol: 2.6 ml/hr (provides 66 kcals daily)  Reviewed I/O's: -615 ml x 24 hours and -3.3 L since admission  UOP: 975 ml x 24 hours  Pt unavailable at time of visit. Unable to obtain nutrition-related history or completion nutrition-focused physical exam at this time.   Per GI notes, plan to perform EGD after intubation today.   Prior to intubation, pt with good oral intake. Noted meal completions 80-100%. Per H&P, pt reported decreased appetite and early satiety PTA.   Reviewed wt hx; noted distant history of weight loss.   Medications reviewed and include levophed.   Lab Results  Component Value Date   HGBA1C 6.7 (H) 09/03/2020   PTA DM medications are 1700 mg metformin every morning and 850 mg metformin  every night.   Labs reviewed. Inpatient orders for glycemic control are 0-9 units insulin aspart TID with meals.  Diet Order:   Diet Order            Diet NPO time specified  Diet effective now                 EDUCATION NEEDS:   No education needs have been identified at this time  Skin:  Skin Assessment: Reviewed RN Assessment  Last BM:  09/06/20  Height:   Ht Readings from Last 1 Encounters:  09/03/20 5' 7"  (1.702 m)    Weight:   Wt Readings from Last 1 Encounters:  09/07/20 86.6 kg    Ideal Body Weight:  67.3 kg  BMI:  Body mass index is 29.9 kg/m.  Estimated Nutritional Needs:   Kcal:  2019  Protein:  110-125 grams  Fluid:  > 2 L   Loistine Chance, RD, LDN, [place] Registered Dietitian II Certified Diabetes Care and Education Specialist Please refer to [facility] for RD and/or RD on-call/weekend/after hours pager

## 2020-09-07 NOTE — Consult Note (Addendum)
Referring Provider:  [facility]         Primary Care Physician:  Riley Lam Primary Gastroenterologist:    Althia Forts         We were asked to see this patient for:    GI bleed                Attending physician's note   I have taken a history, examined the patient and reviewed the chart. I agree with the Advanced Practitioner's note, impression and recommendations.  70 year old gentleman admitted with CHF, new onset A. fib started on Eliquis with acute decline in hemoglobin from 13-6.7 He had an episode of melena this morning  CT abdomen and pelvis yesterday without contrast negative for RP bleed.  Moderate right effusion and pulmonary edema. He has remote history of epistaxis in 2014, none recently   Patient is in respiratory distress, acute respiratory failure, O2 sat low 90's on high flow nasal cannula Will need to exclude acute GI bleed, will plan for bedside EGD later this afternoon once his respiratory status is optimized Plan for patient to move to ICU and intubation  Transfuse PRBC to hemoglobin greater than 7 PPI gtt. NPO  Further recommendations based on findings on EGD  The risks and benefits as well as alternatives of endoscopic procedure(s) have been discussed and reviewed. All questions answered. The patient and his wife agrees to proceed.    Damaris Hippo , MD [phone]      ASSESSMENT / PLAN:   # 70 yo male admitted with acute respiratory failure secondary to CHF, new Afib requring anticoagulation. Now with new anemia. Significant decline in hgb over past few days after starting anticoagulant Hgb 13 >> 6.7. CT scan negative for RP bleed.  There was no overt GI bleeding until just this am when patient passed a black stool. He takes 2 baby asa daily at home. Rule out PUD --Eliquis on hold since yesterday morning --Blood transfusion already ordered.  --Starting PPI infusion.  --Patient will need EGD. He is at increased risk for  procedures right now. Will discuss timing of procedure with Dr. Silverio Decamp.    # Acute respiratory failure secondary to acute on chronic right heart failure. Acute pulmonary edema, R> L pleural effusions.   # PAF, new onset. Plan is for Eliquis then DCCV in a few weeks.  # AKI, improving    # Right lung mass     HPI:                                                                                                                             Chief Complaint: anemia  Masiah Woody is a 70 y.o. male with a PMH of DM, HTN, hyperlipidemia, Afib, former smoker.   Sultan presented to ED on 09/03/20 with progressive shortness of breath. He has chronic SOB but per wife it has recently progressed. Endorses cough productive of clear secretions. PCP had  ruled out PE in February. Other than SOB patient was during fairly well prior to admission. No extremity swelling. No orthopnea though he always sleeps elevated. He has been admitted with acute CHF, respiratory failure,  AFIB w/ RVR.  Echo 09/03/20 showed EF 60-65% severe RV enlargement with severely depressed RV systolic  function and moderately elevated PASP. The interventricular septum is flattened in systole consistent with RV pressure overload. He underwent cardiac cath on 4/18 showing moderate PAH. Heart failure team is following.   We were asked to evaluate for acute anemia on Eliquis for new Afib. There has been no overt GI bleeding until this am when he passed a black stool. CT scan negative for RP bleed. Patient takes daily ASA at home, no other NSAID. Not on a PPI. He denies any chronic GI problems. He hasn't been having any abdominal pain or nausea / vomiting. No hx of PUD. Patient has never seen GI.    PREVIOUS ENDOSCOPIC EVALUATIONS / PERTINENT STUDIES   none  Past Medical History:  Diagnosis Date  . Diabetes mellitus without complication ([place])   . Epistaxis 2014   Secondary to a nasal abnormality, treated and resolved  . Hyperlipidemia due to  type 2 diabetes mellitus ([place])   . Hypertension     Past Surgical History:  Procedure Laterality Date  . NOSE SURGERY  2014  . PELVIC FRACTURE SURGERY  2013  . RIGHT/LEFT HEART CATH AND CORONARY ANGIOGRAPHY N/A 09/04/2020   Procedure: RIGHT/LEFT HEART CATH AND CORONARY ANGIOGRAPHY;  Surgeon: Jolaine Artist, MD;  Location: [facility];  Service: Cardiovascular;  Laterality: N/A;    Prior to Admission medications   Medication Sig Start Date End Date Taking? Authorizing Provider  acetaminophen (TYLENOL) 325 MG tablet Take 650 mg by mouth every 6 (six) hours as needed for headache (pain).   Yes [provider]  amLODipine (NORVASC) 5 MG tablet Take 5 mg by mouth every morning.   Yes [provider]  aspirin EC 81 MG tablet Take 81 mg by mouth every morning. Swallow whole.   Yes [provider]  atorvastatin (LIPITOR) 40 MG tablet Take 40 mg by mouth every evening.   Yes [provider]  buPROPion (WELLBUTRIN SR) 150 MG 12 hr tablet Take 150 mg by mouth 2 (two) times daily. 08/01/20  Yes [provider]  enalapril-hydrochlorothiazide (VASERETIC) 10-25 MG per tablet Take 1 tablet by mouth at bedtime.   Yes [provider]  glipiZIDE (GLUCOTROL XL) 10 MG 24 hr tablet Take 10 mg by mouth every morning.   Yes [provider]  metFORMIN (GLUCOPHAGE) 850 MG tablet Take 850-1,700 mg by mouth See admin instructions. Take 2 tablets (1700 mg) by mouth every morning and 1 tablet (850 mg) at night 08/01/20  Yes [provider]  Multiple Vitamin (MULTIVITAMIN WITH MINERALS) TABS tablet Take 1 tablet by mouth daily.   Yes [provider]  pioglitazone (ACTOS) 15 MG tablet Take 15 mg by mouth 2 (two) times daily.   Yes [provider]  buPROPion (WELLBUTRIN XL) 150 MG 24 hr tablet Take 150 mg by mouth 2 (two) times daily. Patient not taking: No sig reported    [provider]    Current  Facility-Administered Medications  Medication Dose Route Frequency Provider Last Rate Last Admin  . 0.9 %  sodium chloride infusion (Manually program via Guardrails IV Fluids)   Intravenous Once Gladys Damme, MD      . 0.9 %  sodium chloride infusion  250 mL Intravenous PRN Bensimhon, Shaune Pascal, MD      . acetaminophen (TYLENOL) tablet 650 mg  650 mg Oral Q4H PRN Bensimhon, Shaune Pascal, MD   650 mg at 09/06/20 0656  . atorvastatin (LIPITOR) tablet 40 mg  40 mg Oral Daily Bensimhon, Shaune Pascal, MD   40 mg at 09/07/20 2952  . buPROPion [facility] SR) 12 hr tablet 150 mg  150 mg Oral BID Bensimhon, Shaune Pascal, MD   150 mg at 09/07/20 8413  . insulin aspart (novoLOG) injection 0-9 Units  0-9 Units Subcutaneous TID WC Bensimhon, Shaune Pascal, MD   2 Units at 09/07/20 [phone]  . metoprolol tartrate (LOPRESSOR) tablet 25 mg  25 mg Oral Q8H Dunn, Dayna N, PA-C   25 mg at 09/06/20 0537  . ondansetron (ZOFRAN) injection 4 mg  4 mg Intravenous Q6H PRN Bensimhon, Shaune Pascal, MD        Allergies as of 09/03/2020 - Review Complete 09/03/2020  Allergen Reaction Noted  . Novocain [procaine] Other (See Comments) 05/14/2013    Family History  Problem Relation Age of Onset  . Diabetes Mother   . Alzheimer's disease Mother   . Stroke Father   . Heart Problems Father        had a pacemaker  . Diabetes Brother     Social History   Socioeconomic History  . Marital status: Married    Spouse name: Not on file  . Number of children: Not on file  . Years of education: Not on file  . Highest education level: Not on file  Occupational History  . Occupation: Retired Arts administrator  Tobacco Use  . Smoking status: Never Smoker  . Smokeless tobacco: Never Used  Substance and Sexual Activity  . Alcohol use: No  . Drug use: No  . Sexual activity: Not on file  Other Topics Concern  . Not on file  Social History Narrative  . Not on file   Social Determinants of Health   Financial Resource Strain: Not on file  Food  Insecurity: Not on file  Transportation Needs: Not on file  Physical Activity: Not on file  Stress: Not on file  Social Connections: Not on file  Intimate Partner Violence: Not on file    Review of Systems: All systems reviewed and negative except where noted in HPI.  OBJECTIVE:    Physical Exam: Vital signs in last 24 hours: Temp:  [98 F (36.7 C)-98.3 F (36.8 C)] 98.3 F (36.8 C) (04/22 0517) Pulse Rate:  [74-108] 99 (04/22 0517) Resp:  [19-26] 19 (04/22 0517) BP: (75-97)/(50-67) 92/58 (04/22 0517) SpO2:  [90 %-100 %] 92 % (04/22 0517) Weight:  [86.6 kg] 86.6 kg (04/22 0100) Last BM Date: 09/06/20 General:   Alert male in NAD Psych:  Pleasant, cooperative. Normal mood and affect. Eyes:  Pupils equal, sclera clear, no icterus.   Conjunctiva pink. Ears:  Normal auditory acuity. Nose:  No deformity, discharge,  or lesions. Neck:  Supple; no masses Lungs:  Decreased breath sounds at both bases. No wheezesi.  Heart:  Regular rate,  no lower extremity edema Abdomen:  Soft, non-distended, nontender, BS active, no palp mass   Rectal:  Deferred  Msk:  Symmetrical without gross deformities. . Neurologic:  Alert and  oriented x4;  grossly normal neurologically. Skin:  Intact without significant lesions or rashes.  Filed Weights   09/05/20 0308 09/06/20 0530 09/07/20 0100  Weight: 86.9 kg 86.5 kg 86.6 kg  Scheduled inpatient medications . sodium chloride   Intravenous Once  . atorvastatin  40 mg Oral Daily  . buPROPion  150 mg Oral BID  . insulin aspart  0-9 Units Subcutaneous TID WC  . metoprolol tartrate  25 mg Oral Q8H      Intake/Output from previous day: 04/21 0701 - 04/22 0700 In: 360 [P.O.:360] Out: 975 [Urine:975] Intake/Output this shift: No intake/output data recorded.   Lab Results: Recent Labs    09/06/20 0452 09/06/20 1247 09/07/20 0537  WBC 11.0* 9.7 12.6*  HGB 9.9* 9.1* 6.7*  HCT 30.7* 27.8* 20.1*  PLT 280 283 232   BMET Recent Labs     09/06/20 0452 09/06/20 1247 09/07/20 0537  NA 135 131* 131*  K 4.5 4.6 4.8  CL 101 101 102  CO2 26 25 23   GLUCOSE 184* 162* 199*  BUN 57* 67* 64*  CREATININE 1.14 1.12 1.14  CALCIUM 8.2* 7.6* 7.5*   LFT No results for input(s): PROT, ALBUMIN, AST, ALT, ALKPHOS, BILITOT, BILIDIR, IBILI in the last 72 hours. PT/INR No results for input(s): LABPROT, INR in the last 72 hours. Hepatitis Panel No results for input(s): HEPBSAG, HCVAB, HEPAIGM, HEPBIGM in the last 72 hours.   . CBC Latest Ref Rng & Units 09/07/2020 09/06/2020 09/06/2020  WBC 4.0 - 10.5 K/uL 12.6(H) 9.7 11.0(H)  Hemoglobin 13.0 - 17.0 g/dL 6.7(LL) 9.1(L) 9.9(L)  Hematocrit 39.0 - 52.0 % 20.1(L) 27.8(L) 30.7(L)  Platelets 150 - 400 K/uL 232 283 280    . CMP Latest Ref Rng & Units 09/07/2020 09/06/2020 09/06/2020  Glucose 70 - 99 mg/dL 199(H) 162(H) 184(H)  BUN 8 - 23 mg/dL 64(H) 67(H) 57(H)  Creatinine 0.61 - 1.24 mg/dL 1.14 1.12 1.14  Sodium 135 - 145 mmol/L 131(L) 131(L) 135  Potassium 3.5 - 5.1 mmol/L 4.8 4.6 4.5  Chloride 98 - 111 mmol/L 102 101 101  CO2 22 - 32 mmol/L 23 25 26   Calcium 8.9 - 10.3 mg/dL 7.5(L) 7.6(L) 8.2(L)   Studies/Results: CT ABDOMEN PELVIS WO CONTRAST  Result Date: 09/06/2020 CLINICAL DATA:  New onset anemia. Patient on anticoagulation. Rule out retroperitoneal bleed. EXAM: CT ABDOMEN AND PELVIS WITHOUT CONTRAST TECHNIQUE: Multidetector CT imaging of the abdomen and pelvis was performed following the standard protocol without IV contrast. COMPARISON:  February 12, 2020 and September 03, 2020 FINDINGS: Lower chest: Small to moderate RIGHT effusion similar to recent CT of the chest Basilar septal thickening with similar appearance when compared to recent imaging. Airspace disease in the RIGHT lower chest and LEFT lung base with improvement. Resolution of LEFT-sided pleural fluid. Heart size enlarged. Low-attenuation in cardiac chambers compatible with given history of anemia. Hepatobiliary: No  focal, suspicious hepatic lesion on noncontrast imaging. No pericholecystic stranding. Pancreas: Pancreatic atrophy.  No signs of inflammation. Spleen: Spleen normal size and contour. Adrenals/Urinary Tract: Adrenal glands are normal. Mild perinephric stranding bilaterally. No sign of hydronephrosis. Urinary bladder is partially collapsed. No nephrolithiasis. No ureteral calculi. Stomach/Bowel: Fluid in the stomach no perigastric stranding. Small bowel is normal caliber. Small-bowel now contained within a moderately large ventral hernia with moderate rectus diastasis at the site of the hernia. Diastasis at 4.9 cm. No sign of bowel obstruction related to these findings. Fat with herniated through this area on the prior study. Colonic diverticulosis. No sign of diverticulitis. Appendix not visualized though no secondary signs to suggest acute appendicitis. Vascular/Lymphatic: Calcified atheromatous plaque of the abdominal aorta. No aneurysmal dilation. Smooth contour of the IVC. There is no  gastrohepatic or hepatoduodenal ligament lymphadenopathy. No retroperitoneal or mesenteric lymphadenopathy. No pelvic sidewall lymphadenopathy. Streak artifact from sacroiliac screws limit assessment. Reproductive: Unremarkable on limited assessment. Other: Bilateral fat containing inguinal hernias and moderately large ventral hernia containing bowel and fat without obstruction. Musculoskeletal: No expansion of abdominal wall musculature. No expansion of the psoas musculature or other visualized muscular structures. No signs to indicate retroperitoneal hematoma. ORIF of previous sacroiliac diastasis. No destructive bone finding or acute bone process. IMPRESSION: 1. No signs of retroperitoneal hematoma. 2. Small to moderate RIGHT effusion similar to recent CT of the chest. Septal thickening may be indicative of volume overload or signs of heart failure. Areas of ground-glass and airspace disease with some improvement may be  indicative of improving superimposed pneumonitis. 3. Airspace disease in the RIGHT lower chest and LEFT lung base with improvement. 4. Moderately large ventral hernia containing bowel and fat without obstruction. 5. Colonic diverticulosis without evidence of diverticulitis. 6. Aortic atherosclerosis. Electronically Signed   By: Zetta Bills M.D.   On: 09/06/2020 16:59   DG CHEST PORT 1 VIEW  Result Date: 09/06/2020 CLINICAL DATA:  Hypotension.  Shortness of breath. EXAM: PORTABLE CHEST 1 VIEW COMPARISON:  Chest x-ray 09/05/2020. FINDINGS: Stable cardiomegaly. Persistent bilateral pulmonary infiltrates with progressive infiltrate right upper lung. Tiny bilateral pleural effusions cannot be excluded. No pneumothorax. Degenerative changes scoliosis thoracic spine. IMPRESSION: 1.  Stable cardiomegaly. 2. Persistent bilateral pulmonary infiltrates with progressive infiltrate right upper lung. Tiny bilateral pleural effusions cannot be excluded. Electronically Signed   By: Marcello Moores  Register   On: 09/06/2020 12:43    Principal Problem:   Acute hypoxemic respiratory failure ([place]) Active Problems:   CHF exacerbation ([place])   Hyperlipidemia due to type 2 diabetes mellitus ([place])   Atrial fibrillation ([place])   Right bundle branch block    Tye Savoy, NP-C @  09/07/2020, 9:41 AM

## 2020-09-07 NOTE — Progress Notes (Signed)
Advanced Heart Failure Rounding Note  PCP-Cardiologist: Elouise Munroe, MD   Subjective:    Remains in AF.   Developed melena overnight. Eliquis stopped. CT with no RP bleed  Hgb down to 6.7, SBP dropped into 70s now back up to 90. Getting 1u RBCs now  Remains dyspneic  Objective:   Weight Range: 86.6 kg Body mass index is 29.9 kg/m.   Vital Signs:   Temp:  [98 F (36.7 C)-99.2 F (37.3 C)] 99.1 F (37.3 C) (04/22 1117) Pulse Rate:  [50-112] 50 (04/22 1117) Resp:  [19-26] 26 (04/22 1117) BP: (90-126)/(50-108) 92/64 (04/22 1128) SpO2:  [90 %-100 %] 98 % (04/22 1117) Weight:  [86.6 kg] 86.6 kg (04/22 0100) Last BM Date: 09/06/20  Weight change: Filed Weights   09/05/20 0308 09/06/20 0530 09/07/20 0100  Weight: 86.9 kg 86.5 kg 86.6 kg    Intake/Output:   Intake/Output Summary (Last 24 hours) at 09/07/2020 1158 Last data filed at 09/07/2020 0100 Gross per 24 hour  Intake 360 ml  Output 975 ml  Net -615 ml      Physical Exam   General:  Pale tachypneic HEENT: normal Neck: supple. no JVD. Carotids 2+ bilat; no bruits. No lymphadenopathy or thryomegaly appreciated. Cor: PMI nondisplaced. Irregular rate & rhythm. No rubs, gallops or murmurs. Lungs: coarse Abdomen: obese soft, nontender, nondistended. + large ventral hernia  Extremities: no cyanosis, clubbing, rash, edema Neuro: alert & orientedx3, cranial nerves grossly intact. moves all 4 extremities w/o difficulty. Affect pleasant   Telemetry   A fib 80-90s Personally reviewed   Labs    CBC Recent Labs    09/06/20 1247 09/07/20 0537  WBC 9.7 12.6*  HGB 9.1* 6.7*  HCT 27.8* 20.1*  MCV 95.2 94.4  PLT 283 748   Basic Metabolic Panel Recent Labs    09/06/20 0452 09/06/20 1247 09/07/20 0537  NA 135 131* 131*  K 4.5 4.6 4.8  CL 101 101 102  CO2 26 25 23   GLUCOSE 184* 162* 199*  BUN 57* 67* 64*  CREATININE 1.14 1.12 1.14  CALCIUM 8.2* 7.6* 7.5*  MG 1.6*  --  1.7   Liver Function  Tests No results for input(s): AST, ALT, ALKPHOS, BILITOT, PROT, ALBUMIN in the last 72 hours. No results for input(s): LIPASE, AMYLASE in the last 72 hours. Cardiac Enzymes No results for input(s): CKTOTAL, CKMB, CKMBINDEX, TROPONINI in the last 72 hours.  BNP: BNP (last 3 results) Recent Labs    09/03/20 1433  BNP 1,202.7*    ProBNP (last 3 results) No results for input(s): PROBNP in the last 8760 hours.   D-Dimer No results for input(s): DDIMER in the last 72 hours. Hemoglobin A1C No results for input(s): HGBA1C in the last 72 hours. Fasting Lipid Panel No results for input(s): CHOL, HDL, LDLCALC, TRIG, CHOLHDL, LDLDIRECT in the last 72 hours. Thyroid Function Tests No results for input(s): TSH, T4TOTAL, T3FREE, THYROIDAB in the last 72 hours.  Invalid input(s): FREET3  Other results:   Imaging    CT ABDOMEN PELVIS WO CONTRAST  Result Date: 09/06/2020 CLINICAL DATA:  New onset anemia. Patient on anticoagulation. Rule out retroperitoneal bleed. EXAM: CT ABDOMEN AND PELVIS WITHOUT CONTRAST TECHNIQUE: Multidetector CT imaging of the abdomen and pelvis was performed following the standard protocol without IV contrast. COMPARISON:  February 12, 2020 and September 03, 2020 FINDINGS: Lower chest: Small to moderate RIGHT effusion similar to recent CT of the chest Basilar septal thickening with similar appearance when  compared to recent imaging. Airspace disease in the RIGHT lower chest and LEFT lung base with improvement. Resolution of LEFT-sided pleural fluid. Heart size enlarged. Low-attenuation in cardiac chambers compatible with given history of anemia. Hepatobiliary: No focal, suspicious hepatic lesion on noncontrast imaging. No pericholecystic stranding. Pancreas: Pancreatic atrophy.  No signs of inflammation. Spleen: Spleen normal size and contour. Adrenals/Urinary Tract: Adrenal glands are normal. Mild perinephric stranding bilaterally. No sign of hydronephrosis. Urinary bladder  is partially collapsed. No nephrolithiasis. No ureteral calculi. Stomach/Bowel: Fluid in the stomach no perigastric stranding. Small bowel is normal caliber. Small-bowel now contained within a moderately large ventral hernia with moderate rectus diastasis at the site of the hernia. Diastasis at 4.9 cm. No sign of bowel obstruction related to these findings. Fat with herniated through this area on the prior study. Colonic diverticulosis. No sign of diverticulitis. Appendix not visualized though no secondary signs to suggest acute appendicitis. Vascular/Lymphatic: Calcified atheromatous plaque of the abdominal aorta. No aneurysmal dilation. Smooth contour of the IVC. There is no gastrohepatic or hepatoduodenal ligament lymphadenopathy. No retroperitoneal or mesenteric lymphadenopathy. No pelvic sidewall lymphadenopathy. Streak artifact from sacroiliac screws limit assessment. Reproductive: Unremarkable on limited assessment. Other: Bilateral fat containing inguinal hernias and moderately large ventral hernia containing bowel and fat without obstruction. Musculoskeletal: No expansion of abdominal wall musculature. No expansion of the psoas musculature or other visualized muscular structures. No signs to indicate retroperitoneal hematoma. ORIF of previous sacroiliac diastasis. No destructive bone finding or acute bone process. IMPRESSION: 1. No signs of retroperitoneal hematoma. 2. Small to moderate RIGHT effusion similar to recent CT of the chest. Septal thickening may be indicative of volume overload or signs of heart failure. Areas of ground-glass and airspace disease with some improvement may be indicative of improving superimposed pneumonitis. 3. Airspace disease in the RIGHT lower chest and LEFT lung base with improvement. 4. Moderately large ventral hernia containing bowel and fat without obstruction. 5. Colonic diverticulosis without evidence of diverticulitis. 6. Aortic atherosclerosis. Electronically Signed    By: Zetta Bills M.D.   On: 09/06/2020 16:59   DG CHEST PORT 1 VIEW  Result Date: 09/06/2020 CLINICAL DATA:  Hypotension.  Shortness of breath. EXAM: PORTABLE CHEST 1 VIEW COMPARISON:  Chest x-ray 09/05/2020. FINDINGS: Stable cardiomegaly. Persistent bilateral pulmonary infiltrates with progressive infiltrate right upper lung. Tiny bilateral pleural effusions cannot be excluded. No pneumothorax. Degenerative changes scoliosis thoracic spine. IMPRESSION: 1.  Stable cardiomegaly. 2. Persistent bilateral pulmonary infiltrates with progressive infiltrate right upper lung. Tiny bilateral pleural effusions cannot be excluded. Electronically Signed   By: Marcello Moores  Register   On: 09/06/2020 12:43     Medications:     Scheduled Medications: . sodium chloride   Intravenous Once  . atorvastatin  40 mg Oral Daily  . buPROPion  150 mg Oral BID  . etomidate      . fentaNYL      . insulin aspart  0-9 Units Subcutaneous TID WC  . ketamine HCl      . metoprolol tartrate  25 mg Oral Q8H  . midazolam      . [START ON 09/10/2020] pantoprazole  40 mg Intravenous Q12H  . rocuronium bromide        Infusions: . sodium chloride    . magnesium sulfate bolus IVPB    . pantoprozole (PROTONIX) infusion    . pantoprazole (PROTONIX) 80 mg IVPB      PRN Medications: sodium chloride, acetaminophen, ondansetron (ZOFRAN) IV    Assessment/Plan  1. Acute upper GI bleed with symptomatic anemia - hgb down to 6.7 + melena - Eliquis off - CT negative for RP bleed - move to ICU - transfuse 2u RBCs ASAP - Add PPI - D/w GI and CCM at bedside will scope at bedside in ICU when more stable. Will likely need intubation for procedure  2. PAH with cor pulmonale - CT chest 4/22: No PE or ILD - Echo LVEF 60% severe RV dilation and HK - Cath with moderate PAH. PA = 66/31 (46) PCW = 13 Fick = 4.1/2.1 PVR = 7.8 WU - Suspect this is WHO Group 3 PAH due to hypoxic lung disease/OHS/OSA - Will order PFTs (when better  compensated) and will need outpatient sleep study - Check serologies for completeness sake>> in process  - Will need home O2. Oxygen requirement reduced to 6 liters.  - Diuretics on hold with GIB bleed AKI  3. Paroxysmal AF - new onset. Unclear duration  - Rate controlled.  - Eliquis on hold d/t GI bleed - Continue with rate control for now. No DC-CV for now   4. CAD - No chest pain.   -mostly non-obstructive - atorva 40 added  - No ASA with AC  4. R lung mass - will need CT f/u   CRITICAL CARE Performed by: Glori Bickers  Total critical care time: 45 minutes  Critical care time was exclusive of separately billable procedures and treating other patients.  Critical care was necessary to treat or prevent imminent or life-threatening deterioration.  Critical care was time spent personally by me (independent of midlevel providers or residents) on the following activities: development of treatment plan with patient and/or surrogate as well as nursing, discussions with consultants, evaluation of patient's response to treatment, examination of patient, obtaining history from patient or surrogate, ordering and performing treatments and interventions, ordering and review of laboratory studies, ordering and review of radiographic studies, pulse oximetry and re-evaluation of patient's condition.    Length of Stay: 3  Glori Bickers, MD  09/07/2020, 11:58 AM  Advanced Heart Failure Team Pager [phone] (M-F; 7a - 5p)  Please contact [facility] for night-coverage after hours (5p -7a ) and weekends on [email or web address]

## 2020-09-07 NOTE — Op Note (Signed)
[facility] Patient Name: Gary [name] Procedure Date : 09/07/2020 MRN: [id number] Attending MD: Jackquline Denmark , MD Date of Birth: 1950-08-05 CSN: [id number] Age: 70 Admit Type: Inpatient Procedure:                Upper GI endoscopy Indications:              70 year old adm with CHF, new onset A. fib started                            on Eliquis with acute decline in hemoglobin from 13                            to 6.7 with melena. Providers:                Jackquline Denmark, MD, Clyde Lundborg, RN, Ladona Ridgel, Technician Referring MD:              Medicines:                General Anesthesia. Pt intubated. Emergent Bedside                            EGD was performed. Complications:            No immediate complications. Estimated Blood Loss:     Estimated blood loss: none. Procedure:                Pre-Anesthesia Assessment:                           - Prior to the procedure, a History and Physical                            was performed, and patient medications and                            allergies were reviewed. The patient's tolerance of                            previous anesthesia was also reviewed. The risks                            and benefits of the procedure and the sedation                            options and risks were discussed with the patient.                            All questions were answered, and informed consent                            was obtained. Prior Anticoagulants: The patient has  taken Eliquis (apixaban), last dose was 1 day prior                            to procedure. ASA Grade Assessment: IV - A patient                            with severe systemic disease that is a constant                            threat to life. After reviewing the risks and                            benefits, the patient was deemed in satisfactory                            condition to undergo the  procedure.                           After obtaining informed consent, the endoscope was                            passed under direct vision. Throughout the                            procedure, the patient's blood pressure, pulse, and                            oxygen saturations were monitored continuously. The                            [id number]-H190 ([id number] Olympus gastroscope was                            introduced through the mouth, and advanced to the                            second part of duodenum. The upper GI endoscopy was                            accomplished without difficulty. The patient                            tolerated the procedure well. Scope In: Scope Out: Findings:      The examined esophagus was normal except for salmon-colored mucosa       projecting 1 cm above the GE junction (not biopsied)      A small hiatal hernia was present.      Scattered moderate inflammation characterized by erosions was found in       the gastric body and in the gastric antrum. 2 Biopsies were carefully       taken with a cold forceps for histology to r/o HP.      One large (15 mm) non-bleeding cratered, deep duodenal ulcer with no       stigmata of bleeding was  found in the duodenal bulb. photodocumentation       was obtained. A smaller 6 mm ulcer was also noted in the distal first       portion of the duodenum. No active bleeding. There was no blood found on       EGD. Impression:               - Large duodenal ulcer (no active bleeding)                           - Short segment Barrett's esophagus withSmall                            hiatal hernia.                           - Erosive Gastritis. Recommendation:           - Continue present medications including IV                            Protonix drip 8 mg/h x 72 hours, then can                            transition to 40 mg IV twice daily. Once able to                            take p.o., switch to 40 mg p.o. twice daily  x 12                            weeks, then once a day indefinitely                           - Await pathology results.                           - Repeat upper endoscopy in 3-4 months for further                            eval of Barrett's.                           - If any active bleeding, rpt EGD.                           - If brisk bleed, then CTA followed by GDA                            embolization                           - I believe we can resume Eliquis in 1 week given                            risks, with close monitoring. Ideally, in 2 weeks.                           -  Avoid nonsteroidals.                           - The findings and recommendations were discussed                            with the patient's family. Procedure Code(s):        --- Professional ---                           [phone], Esophagogastroduodenoscopy, flexible,                            transoral; with biopsy, single or multiple Diagnosis Code(s):        --- Professional ---                           K44.9, Diaphragmatic hernia without obstruction or                            gangrene                           K29.70, Gastritis, unspecified, without bleeding                           K26.9, Duodenal ulcer, unspecified as acute or                            chronic, without hemorrhage or perforation                           K92.1, Melena (includes Hematochezia) CPT copyright 2019 [employer]. All rights reserved. The codes documented in this report are preliminary and upon coder review may  be revised to meet current compliance requirements. Jackquline Denmark, MD 09/07/2020 5:08:10 PM This report has been signed electronically. Number of Addenda: 0

## 2020-09-07 NOTE — Care Management Important Message (Addendum)
Important Message  Patient Details  Name: Gary [name] MRN: [id number] Date of Birth: [birth year]   Medicare Important Message Given:  Yes  - pt. discharged home    Shelda Altes 09/07/2020, 10:20 AM

## 2020-09-07 NOTE — Progress Notes (Signed)
EGD with clean based ulcers, no active bleeding. Attempted to call wife-- left a message on her cell phone. Dr Lyndel Safe will update as well.  Julian Hy, DO 09/07/20 5:02 PM [facility]

## 2020-09-07 NOTE — Procedures (Signed)
Arterial Catheter Insertion Procedure Note  Gary [name]  [id number]  1951/03/09  Date:09/07/20  Time:4:31 PM    Provider Performing: Candee Furbish    Procedure: Insertion of Arterial Line [phone]) with US guidance (34196)   Indication(s) Blood pressure monitoring and/or need for frequent ABGs  Consent Unable to obtain consent due to emergent nature of procedure.  Anesthesia None   Time Out Verified patient identification, verified procedure, site/side was marked, verified correct patient position, special equipment/implants available, medications/allergies/relevant history reviewed, required imaging and test results available.   Sterile Technique Maximal sterile technique including full sterile barrier drape, hand hygiene, sterile gown, sterile gloves, mask, hair covering, sterile ultrasound probe cover (if used).   Procedure Description Area of catheter insertion was cleaned with chlorhexidine and draped in sterile fashion. With real-time ultrasound guidance an arterial catheter was placed into the left radial artery.  Appropriate arterial tracings confirmed on monitor.     Complications/Tolerance None; patient tolerated the procedure well.   EBL Minimal   Specimen(s) None

## 2020-09-07 NOTE — Procedures (Signed)
Central Venous Catheter Insertion Procedure Note  Hutch Rhett  [id number]  1950-09-21  Date:09/07/20  Time:2:49 PM   Provider Performing:Ryosuke Ericksen Cipriano Mile   Procedure: Insertion of Non-tunneled Central Venous [phone]) with US guidance (26203)   Indication(s) Medication administration  Consent Risks of the procedure as well as the alternatives and risks of each were explained to the patient and/or caregiver.  Consent for the procedure was obtained and is signed in the bedside chart  Anesthesia Topical only with 1% lidocaine   Timeout Verified patient identification, verified procedure, site/side was marked, verified correct patient position, special equipment/implants available, medications/allergies/relevant history reviewed, required imaging and test results available.  Sterile Technique Maximal sterile technique including full sterile barrier drape, hand hygiene, sterile gown, sterile gloves, mask, hair covering, sterile ultrasound probe cover (if used).  Procedure Description Area of catheter insertion was cleaned with chlorhexidine and draped in sterile fashion.  With real-time ultrasound guidance a central venous catheter was placed into the left internal jugular vein. Nonpulsatile blood flow and easy flushing noted in all ports.  The catheter was sutured in place and sterile dressing applied.  Complications/Tolerance None; patient tolerated the procedure well. Chest X-ray is ordered to verify placement for internal jugular or subclavian cannulation.   Chest x-ray is not ordered for femoral cannulation.  EBL Minimal  Specimen(s) None

## 2020-09-07 NOTE — Progress Notes (Signed)
[facility] Progress Note Patient Name: Gary [name] DOB: [birth year] MRN: [id number]   Date of Service  09/07/2020  HPI/Events of Note  ABG on 100%/PRVC 28/TV 520/P 12 = 7.21/65/59/25. Patient had only been on PEEP 12 for 20 minutes prior to ABG.  eICU Interventions  Plan: 1. Increase PRVC rate t0 35. 2. Repeat ABG at 11 PM.     Intervention Category Major Interventions: Acid-Base disturbance - evaluation and management;Respiratory failure - evaluation and management  Lysle Dingwall 09/07/2020, 8:46 PM

## 2020-09-07 NOTE — Progress Notes (Addendum)
NAME:  Gary [name], MRN:  [id number], DOB:  1950-09-30, LOS: 3 ADMISSION DATE:  09/03/2020, CONSULTATION DATE:  09/07/2020 REFERRING MD:  Nori Riis, CHIEF COMPLAINT:  GI Bleed   History of Present Illness:  70 y/o M who presented to [facility] on 4/18 with complaints of shortness of breath.    The patient lives at home with his wife.  He is independent of all ADL's.  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  He is a former smoker - smoked from 1968-2005 off / on, heaviest 1.5 ppd.  He grew up in [place], [place] with coal burning stove.  He has multiple hobbies to include Civil War reenactment with black powder rifles, Actuary, & woodworking.  He denies known history of connective tissue disease within family.  His daughter had Turner's syndrome (deceased at age 29 in 51).  The patient reports he had a sleep study in 2016 which did not warrant intervention.  He also reports a significant nosebleed in 2015 that required balloon packing and additional procedure that sounded like embolization by description for bleeding (per ENT in [place]).  No hx of obstructive sleep apnea (had a negative sleep study in the past), no hx of blood clots.   He reports 3 months prior to presentation he had no shortness of breath with activity.  Patient noted in February 2022 that he began having shortness of breath with exertion and activities that normally were not difficult for him (walking uphill to go to church, taking the dogs out).  He also noted feeling full early in meals.  His wife reports that in the last 2 to 3 weeks he has had significant shortness of breath, decreased p.o. intake.  She noted that on 4/16 and 4/17 he was so short of breath to the point that he did not talk much over the weekend prompting evaluation on 4/18.  He reported difficulty walking into the emergency room.  His wife reported that he had poor  coloring with a grayish appearance.    The patient was admitted per teaching service for further evaluation.  Initially required 4 L of oxygen to maintain oxygen saturations which was quickly escalated to 15 L salter.  Cardiology was consulted with new finding of atrial fibrillation.  Echo evaluation demonstrated severe RV enlargement with severely depressed RV systolic function and moderately elevated PASP, LVEF 55-60%, difficult to assess wall motion abnormalities but appeared grossly normal, mild concentric left ventricular hypertrophy, RV systolic function severely reduced, RV severely enlarged, moderately elevated pulmonary artery pressure with estimated RV systolic pressure of 78.2, RA moderately dilated.  Given echo findings, a CTA of the chest was evaluated and negative for pulmonary embolism but demonstrated enlarged central pulmonary arteries consistent with pulmonary arterial hypertension, enlargement of the right ventricle and right atrium, small bilateral pleural effusions greater on the right and patchy groundglass opacities bilaterally, unresolved right middle lobe opacity.  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  He is currently on 8 L per HFNC with sats of 95% PCCM consulted for evaluation of hypoxemic respiratory failure and catheterization  findings.   Pertinent  Medical History  Former smoker HTN DM Traumatic crush injury from a tree branch with multiple fractures Epistaxis requiring packing   Significant Hospital Events: Including procedures, antibiotic start and stop dates in addition to other pertinent events    4/18 admitted with SOB, echo with  elevated right heart pressures, new finding of A. fib  4/19 R/L heart cath with concern for pulmonary hypertension  4/20 on 15L HFNC  4/21 hypotensive episode, responded to IVF, down to 5L O2   Interim History / Subjective:  Pt has been dizzy over night. States he has been feeling bad and fatigued. HGB had dropped to 6.7 overnight. Initial HGB was 13 on admission. Pt had a maroon stool 4/22 am Eliquis has been stopped.  He has been placed on Protonix gtt. He is currently getting 1 unit PRBC's. 2 have been ordered BP is 938 systolic with MAP of 85 Sats are 95% on 8 L HFNC Net negative 3300 cc's Acute decompensation with acute hypoxic respiratory failure    Objective   Blood pressure (!) 91/51, pulse (!) 112, temperature 98.9 F (37.2 C), temperature source Oral, resp. rate 20, height 5' 7"  (1.702 m), weight 86.6 kg, SpO2 94 %.        Intake/Output Summary (Last 24 hours) at 09/07/2020 1058 Last data filed at 09/07/2020 0100 Gross per 24 hour  Intake 360 ml  Output 975 ml  Net -615 ml   Filed Weights   09/05/20 0308 09/06/20 0530 09/07/20 0100  Weight: 86.9 kg 86.5 kg 86.6 kg    Examination: General: Awake and alert, in mild distress. States he feels bad.Very tired  HENT: NCAT, No LAD, No JVD Lungs: Bilateral chest excursion, Few rhonchi, diminished per bases, No accessory muscle use, mild tachypnea with conversation Cardiovascular: A fib rate of 112, S1, S2, RRR, No RMG Abdomen: Sofy, Non tender, ND, BS diminished, Body mass index is 29.9 kg/m. Extremities: No obvious deformities, 1+ LE edema Neuro: Awake and alert, following commands, MAE x 4 GU: Urinal  Labs/imaging that I havepersonally reviewed  (right click and "Reselect all SmartList Selections" daily)  08/28/2020 CT Abdomen and Pelvis without contrast No signs of retroperitoneal hematoma. 2. Small to moderate RIGHT effusion similar to recent CT of the chest. Septal thickening may be indicative of volume  overload or signs of heart failure. Areas of ground-glass and airspace disease with some improvement may be indicative of improving superimposed pneumonitis. 3. Airspace disease in the RIGHT lower chest and LEFT lung base with improvement. 4. Moderately large ventral hernia containing bowel and fat without obstruction. 5. Colonic diverticulosis without evidence of diverticulitis. 6. Aortic atherosclerosis  CXR 4/21>> Persistent bilateral pulmonary infiltrates with progressive infiltrate right upper lung. Tiny bilateral pleural effusions cannot be excluded  Cardiac Cath 4/19 Left Anterior Descending  Ost LAD to Mid LAD lesion is 20% stenosed.  Mid LAD lesion is 40% stenosed.  Right Coronary Artery  Prox RCA lesion is 40% stenosed.  Dist RCA lesion is 100% stenosed.    Echo 09/03/2020 There is severe RV enlargement with severely depressed RV systolic function and moderately elevated PASP. The interventricular septum is flattened in systole consistent with RV pressure overload. Constellation of findings highly concerning for pulmonary embolism. Cardiology team aware and CTA pending. 2. Left ventricular ejection fraction, by estimation, is 55 to 60%. The left ventricle has normal function. Wall motion difficult to assess due to poor visualization of the LV endocardium but appears grossly normal. There  is mild concentric left ventricular hypertrophy. Diastolic function is indeterminant due to atrial fibrillation. There is the interventricular septum is flattened in systole, consistent with right ventricular pressure overload. 3. Right ventricular systolic function is severely reduced. The right ventricular size is severely enlarged. There is moderately elevated pulmonary artery systolic pressure. The estimated right ventricular systolic pressure is 34.1 mmHg. 4. Right atrial size was moderately dilated. 5. The mitral valve is grossly normal. Mild mitral valve regurgitation. 6.  Tricuspid valve regurgitation is moderate. 7. The aortic valve is tricuspid. There is moderate calcification of the aortic valve. There is moderate thickening of the aortic valve. Aortic valve regurgitation is trivial. Mild to moderate aortic valve sclerosis/calcification is present, without any evidence of aortic stenosis. 8. The inferior vena cava is dilated in size with <50% respiratory variability, suggesting right atrial pressure of 15 mmHg. Comparison(s): No prior Echocardiogram.  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  New AF noted on admit as well as findings of enlarged pulmonary artery on CTA with background mild ground glass, negative for PE, persistent opacities on R and R>L pleural effusion.  No significant background emphysema.  ECHO showed severe RV enlargement with moderate pulmonary HTN. Nonsignificant CAD, Elevated R-sided pressures with normal L-sided pressures on RHC. 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  -Mildly elevated CRP and ESR of unknown significance. CCP of 2  and ANA positive to help understand the significance. - Transfer to ICU with HGB drop and hypotension for EGD. May need intubation to protect airway prior to EGD -Wean supplemental O2 as able to maintain SpO2 >90%. Currently on 8 L HFNC -Needs PSG as an outpatient -Needs OP PFTs to assess for COPD.  - Empiric trial  of duonebs Q6h. Holding steroids at this point due to concern for bleeding in unknown location. -Hold additional diuresis for now. -Wean supplemental O2 as able. Anticipate he may require supplemental oxygen at discharge -OOB mobility, IS - Will need close CXR monitoring with blood transfusions and hypotension   Acute anemia/ Suspected Acute GI Bleed- HGB drop to 6.7 4/22 am.>> receiving 2 units PRBC  Recently started on [facility] is worrisome for occult bleeding, especially with hypotension 4/22  He has a history of epistaxis requiring embolization in the remote past, but no other significant bleeding history. - CT Abdomen and pelvis>> no retroperitoneal bleed,  large ventral hernia containing bowel and fat without Obstruction. Colonic diverticulosis without evidence of diverticulitis. - AC held since 4/21 am   -Recommend serial CBC's and transfusion if Hb<7 or if he has hemodynamically significant bleeding with transfusion goal of hemodynamic stability - Monitor for bleeding . - EGD per GI today ( 4/22) - Protonix gtt per GI  Hypotension BP drop from 120's to 90's on 4/22 Receiving PRBC's 2 units with MAP 85 Plan Will add order pressors as prn, currently not needed Volume from PRBC's should help   Atrial  Fibrillation  RBBB CAD  -AC and rate control per Cardiology -may required DCCV   -AC currently on hold 2/2 GI bleed  AKI improving with diuresis -con't to monitor/ trend -hold additional diuresis  Mediastinal adenopathy vs hilar R lung mass  -needs reimaging in 1 month and would defer biopsy until more compensated  Pt being transferred to ICU for EGD to determine site of bleed. May need intubation for airway protection prior.    Best practice (right click and "Reselect all SmartList Selections" daily)  Diet:  NPO Pain/Anxiety/Delirium protocol (if indicated): No VAP protocol (if indicated): Not indicated DVT prophylaxis: PAS hose GI prophylaxis: Protonix gtt Glucose  control:  SSI Central venous access:  No Arterial line:  N/A Foley:  N/A Mobility:  bed rest   PT consulted:Yes Last date of multidisciplinary goals of care discussion [4/22] Code Status:  full code Disposition: ICU  Labs   CBC: Recent Labs  Lab 09/04/20 0414 09/04/20 1054 09/04/20 1118 09/05/20 0413 09/06/20 0452 09/06/20 1247 09/07/20 0537  WBC 7.9  --   --  8.2 11.0* 9.7 12.6*  HGB 13.4   < > 12.9* 11.6* 9.9* 9.1* 6.7*  HCT 40.8   < > 38.0* 35.7* 30.7* 27.8* 20.1*  MCV 93.8  --   --  94.4 94.5 95.2 94.4  PLT 312  --   --  297 280 283 232   < > = values in this interval not displayed.    Basic Metabolic Panel: Recent Labs  Lab 09/04/20 0414 09/04/20 1054 09/04/20 1118 09/05/20 0413 09/06/20 0452 09/06/20 1247 09/07/20 0537  NA 134*   < > 137 133* 135 131* 131*  K 4.0   < > 3.9 4.2 4.5 4.6 4.8  CL 99  --   --  99 101 101 102  CO2 24  --   --  26 26 25 23   GLUCOSE 75  --   --  116* 184* 162* 199*  BUN 49*  --   --  79* 57* 67* 64*  CREATININE 1.63*  --   --  1.27* 1.14 1.12 1.14  CALCIUM 8.5*  --   --  8.0* 8.2* 7.6* 7.5*  MG 1.8  --   --   --  1.6*  --  1.7   < > = values in this interval not displayed.   GFR: Estimated Creatinine Clearance: 62.5 mL/min (by C-G formula based on SCr of 1.14 mg/dL). Recent Labs  Lab 09/05/20 0413 09/06/20 0452 09/06/20 1247 09/07/20 0537  WBC 8.2 11.0* 9.7 12.6*    Liver Function Tests: No results for input(s): AST, ALT, ALKPHOS, BILITOT, PROT, ALBUMIN in the last 168 hours. No results for input(s): LIPASE, AMYLASE in the last 168 hours. No results for input(s): AMMONIA in the last 168 hours.  ABG    Component Value Date/Time   PHART 7.380 09/04/2020 1054   PCO2ART 40.8 09/04/2020 1054   PO2ART 170 (H) 09/04/2020 1054   HCO3 25.1 09/04/2020 1118   TCO2 26 09/04/2020 1118   ACIDBASEDEF 1.0 09/04/2020 1118   O2SAT 73.0 09/04/2020 1118     Coagulation Profile: No results for input(s): INR, PROTIME in the last  168 hours.  Cardiac Enzymes: No results for input(s): CKTOTAL, CKMB, CKMBINDEX, TROPONINI in the last 168 hours.  HbA1C: Hgb A1c MFr Bld  Date/Time Value Ref Range Status  09/03/2020 02:52 PM 6.7 (H) 4.8 - 5.6 % Final    Comment:    (NOTE) Pre diabetes:  5.7%-6.4%  Diabetes:              >6.4%  Glycemic control for   <7.0% adults with diabetes     CBG: Recent Labs  Lab 09/06/20 0606 09/06/20 1059 09/06/20 1702 09/06/20 2028 09/07/20 0546  GLUCAP 203* 164* 216* 177* 184*       Critical Care  Time : 35 minutes     Magdalen Spatz, MSN, AGACNP-BC [place] for personal pager PCCM on call pager [phone] For hospital use only 09/07/2020  11:36 AM       Mr. Colpitts is a 70 y/o gentleman admitted for acute respiratory failure, found to have Marre[place] during this admission that is likely WHO group II from undiagnosed OSA and possibly COPD. Remote tobacco history. Unimpressive LHC this admission with normal left-sided filling pressures. He has had continued dyspnea despite improving oxygen requirements the past few days. Hypotensive yesterday temporarily, responsive to IVF. Hb had been dropping but no signs of bleeding, Eliquis stopped yesterday. CT abd/pelvis did not demonstrate RP bleed or other source of bleeding. This morning had a melanic stool and Hb 6.7. He had worsening hypotension this morning prompting decision to transfer to ICU. GI has been consulted and will scope but has concerns about hypoxia and ability to perform procedure under conscious sedation. RBCs transfusing now.  BP 96/82   Pulse (!) 113   Temp 99.2 F (37.3 C) (Oral)   Resp (!) 27   Ht 5' 7"  (1.702 m)   Wt 86.6 kg   SpO2 95%   BMI 29.90 kg/m  Elderly man sitting up in bed in NAD [place]/AT, eyes anicteric Awake, alert, answering questions appropriately, moving all extremities.  S1S2, tachycardic, irreg rhythm. Afib on tele.  Breathing  comfortably on [place], rhales bilaterally, no wheezing. Still able to speak with only mildly truncated speech. Abd soft, NT No LE edema, no cyanosis or clubbing.  Pallor, no ecchymoses.  H/H 6.7/20.1 Platelets 232 WBC 12.6 FOBT + CT abd pelvis reviewed- lung cuts with resolving LL infiltrate and left sided effusion. R effusion essentially unchanged  Assessment & plan:  Acute blood loss anemia, acute GIB- unknown location -GI consult; agree that patient needs EEG and this can most safely be done while intubated. We will intubate this afternoon before the procedure and hopefully extubate post-procedure vs tomorrow morning. -transfusing to hemodynamic stability and Hb >7; transfusing 2nd unit now, then repeat CBC -CBC Q6h, INR STAT -PPI gtt -needs adequate IV access -con't holding eliquis; SCDs for DVT prophylaxis  Right persistent pleural effusion, refractory to diuresis R lung mass vs hilar adenopathy -Will need diagnostic thora once more stable; can do while AC is held. Would send for cytology. -Needs follow up CT in 1 month for consideration of biopsy-- anticipate he will need an EBUS.  Afib with RVR -agree with deferring TEE w/ DCCV -transfuse to hemodynamic stability -metoprolol for rate control as long as BP can handle; may need amiodarone but risk spontaneous chemical cardioversion  Acute respiratory failure with hypoxia, WHO group III PH Likely OSA -hold diuresis for now -supplemental O2 as required to maintain SpO2 >90%  Hyponatremia, possibly hypovolemic -con't to monitor -avoid low-Na+ IVF and free water  This patient is critically ill with multiple organ system failure which requires frequent high complexity decision making, assessment, support, evaluation, and titration of therapies. This was completed through the application of advanced monitoring technologies and extensive interpretation of multiple databases. During this encounter  critical care time was devoted to  patient care services described in this note for 45 minutes.  Julian Hy, DO 09/07/20 1:12 PM [facility]

## 2020-09-07 NOTE — Procedures (Signed)
Intubation Procedure Note  Gary [name]  [id number]  September 15, 1950  Date:09/07/20  Time:2:48 PM   Provider Performing:Breahna Boylen C Tamala Julian    Procedure: Intubation (31500)  Indication(s) Respiratory Failure  Consent Risks of the procedure as well as the alternatives and risks of each were explained to the patient and/or caregiver.  Consent for the procedure was obtained and is signed in the bedside chart   Anesthesia Etomidate, Versed, Fentanyl and Rocuronium   Time Out Verified patient identification, verified procedure, site/side was marked, verified correct patient position, special equipment/implants available, medications/allergies/relevant history reviewed, required imaging and test results available.   Sterile Technique Usual hand hygeine, masks, and gloves were used   Procedure Description Patient positioned in bed supine.  Sedation given as noted above.  Patient was intubated with endotracheal tube using Glidescope.  View was Grade 1 full glottis .  Number of attempts was 1.  Colorimetric CO2 detector was consistent with tracheal placement.   Complications/Tolerance None; patient tolerated the procedure well. Chest X-ray is ordered to verify placement.   EBL Minimal   Specimen(s) None

## 2020-09-07 NOTE — Progress Notes (Signed)
Inhaled epoprostenol (VELETRI) Monitoring: Current dose:  50 ng/kg/min and 6.61 mL/hr IBW: 66.1 kg Duration of each syringe: 7.5 hr  Last syringe hung (date/time): 4/22 at 1705  Next syringe due (date/time): 4/23 and 0030 Backup on unit: yes  Current plan:  Continue  Alanda Slim, PharmD, [facility] Clinical Pharmacist Please see AMION for all Pharmacists' Contact Phone Numbers 09/07/2020, 5:09 PM

## 2020-09-07 NOTE — Progress Notes (Signed)
Report given to [facility] RN, Wife made aware of room assignment and transfer to ICU.

## 2020-09-08 DIAGNOSIS — J9601 Acute respiratory failure with hypoxia: Secondary | ICD-10-CM | POA: Diagnosis not present

## 2020-09-08 DIAGNOSIS — I480 Paroxysmal atrial fibrillation: Secondary | ICD-10-CM

## 2020-09-08 LAB — CBC
HCT: 25.8 % — ABNORMAL LOW (ref 39.0–52.0)
HCT: 27.9 % — ABNORMAL LOW (ref 39.0–52.0)
Hemoglobin: 8.6 g/dL — ABNORMAL LOW (ref 13.0–17.0)
Hemoglobin: 8.8 g/dL — ABNORMAL LOW (ref 13.0–17.0)
MCH: 31 pg (ref 26.0–34.0)
MCH: 30.3 pg (ref 26.0–34.0)
MCHC: 33.3 g/dL (ref 30.0–36.0)
MCHC: 31.5 g/dL (ref 30.0–36.0)
MCV: 93.1 fL (ref 80.0–100.0)
MCV: 96.2 fL (ref 80.0–100.0)
Platelets: 289 10*3/uL (ref 150–400)
Platelets: 282 10*3/uL (ref 150–400)
RBC: 2.77 MIL/uL — ABNORMAL LOW (ref 4.22–5.81)
RBC: 2.9 MIL/uL — ABNORMAL LOW (ref 4.22–5.81)
RDW: 15 % (ref 11.5–15.5)
RDW: 15.2 % (ref 11.5–15.5)
WBC: 15.9 10*3/uL — ABNORMAL HIGH (ref 4.0–10.5)
WBC: 15.4 10*3/uL — ABNORMAL HIGH (ref 4.0–10.5)
nRBC: 0.3 % — ABNORMAL HIGH (ref 0.0–0.2)
nRBC: 0.2 % (ref 0.0–0.2)

## 2020-09-08 LAB — TYPE AND SCREEN
ABO/RH(D): O POS
Antibody Screen: NEGATIVE
Unit division: 0
Unit division: 0

## 2020-09-08 LAB — PROCALCITONIN: Procalcitonin: 2.34 ng/mL

## 2020-09-08 LAB — POCT I-STAT 7, (LYTES, BLD GAS, ICA,H+H)
Acid-Base Excess: 2 mmol/L (ref 0.0–2.0)
Acid-Base Excess: 1 mmol/L (ref 0.0–2.0)
Bicarbonate: 27.5 mmol/L (ref 20.0–28.0)
Bicarbonate: 26.8 mmol/L (ref 20.0–28.0)
Calcium, Ion: 1.03 mmol/L — ABNORMAL LOW (ref 1.15–1.40)
Calcium, Ion: 1.02 mmol/L — ABNORMAL LOW (ref 1.15–1.40)
HCT: 26 % — ABNORMAL LOW (ref 39.0–52.0)
HCT: 26 % — ABNORMAL LOW (ref 39.0–52.0)
Hemoglobin: 8.8 g/dL — ABNORMAL LOW (ref 13.0–17.0)
Hemoglobin: 8.8 g/dL — ABNORMAL LOW (ref 13.0–17.0)
O2 Saturation: 100 %
O2 Saturation: 100 %
Patient temperature: 36.9
Patient temperature: 37.4
Potassium: 4.1 mmol/L (ref 3.5–5.1)
Potassium: 5.4 mmol/L — ABNORMAL HIGH (ref 3.5–5.1)
Sodium: 137 mmol/L (ref 135–145)
Sodium: 136 mmol/L (ref 135–145)
TCO2: 29 mmol/L (ref 22–32)
TCO2: 28 mmol/L (ref 22–32)
pCO2 arterial: 47.9 mmHg (ref 32.0–48.0)
pCO2 arterial: 49.7 mmHg — ABNORMAL HIGH (ref 32.0–48.0)
pH, Arterial: 7.367 (ref 7.350–7.450)
pH, Arterial: 7.342 — ABNORMAL LOW (ref 7.350–7.450)
pO2, Arterial: 243 mmHg — ABNORMAL HIGH (ref 83.0–108.0)
pO2, Arterial: 300 mmHg — ABNORMAL HIGH (ref 83.0–108.0)

## 2020-09-08 LAB — BPAM RBC
Blood Product Expiration Date: 202205202359
Blood Product Expiration Date: 202205202359
ISSUE DATE / TIME: 202204221019
ISSUE DATE / TIME: 202204221056
Unit Type and Rh: 5100
Unit Type and Rh: 5100

## 2020-09-08 LAB — BASIC METABOLIC PANEL
Anion gap: 9 (ref 5–15)
BUN: 57 mg/dL — ABNORMAL HIGH (ref 8–23)
CO2: 26 mmol/L (ref 22–32)
Calcium: 7.5 mg/dL — ABNORMAL LOW (ref 8.9–10.3)
Chloride: 102 mmol/L (ref 98–111)
Creatinine, Ser: 1.52 mg/dL — ABNORMAL HIGH (ref 0.61–1.24)
GFR, Estimated: 49 mL/min — ABNORMAL LOW (ref >60)
Glucose, Bld: 71 mg/dL (ref 70–99)
Potassium: 4 mmol/L (ref 3.5–5.1)
Sodium: 137 mmol/L (ref 135–145)

## 2020-09-08 LAB — COOXEMETRY PANEL
Carboxyhemoglobin: 1.3 % (ref 0.5–1.5)
Carboxyhemoglobin: 1.2 % (ref 0.5–1.5)
Methemoglobin: 0.8 % (ref 0.0–1.5)
Methemoglobin: 0.9 % (ref 0.0–1.5)
O2 Saturation: 80.8 %
O2 Saturation: 73.8 %
Total hemoglobin: 9.2 g/dL — ABNORMAL LOW (ref 12.0–16.0)
Total hemoglobin: 9.3 g/dL — ABNORMAL LOW (ref 12.0–16.0)

## 2020-09-08 LAB — TRIGLYCERIDES: Triglycerides: 73 mg/dL (ref <150)

## 2020-09-08 LAB — GLUCOSE, CAPILLARY
Glucose-Capillary: 107 mg/dL — ABNORMAL HIGH (ref 70–99)
Glucose-Capillary: 134 mg/dL — ABNORMAL HIGH (ref 70–99)
Glucose-Capillary: 175 mg/dL — ABNORMAL HIGH (ref 70–99)
Glucose-Capillary: 181 mg/dL — ABNORMAL HIGH (ref 70–99)
Glucose-Capillary: 226 mg/dL — ABNORMAL HIGH (ref 70–99)
Glucose-Capillary: 79 mg/dL (ref 70–99)
Glucose-Capillary: 85 mg/dL (ref 70–99)

## 2020-09-08 MED ORDER — STERILE WATER FOR INJECTION IV SOLN
INTRAVENOUS | Status: DC
  Filled 2020-09-08: qty 1000

## 2020-09-08 MED ORDER — SILDENAFIL CITRATE 20 MG PO TABS: 20.0000 mg | ORAL_TABLET | Freq: Three times a day (TID) | ORAL | Status: DC

## 2020-09-08 MED ORDER — SODIUM BICARBONATE 8.4 % IV SOLN
100.0000 meq | Freq: Once | INTRAVENOUS | Status: AC
  Administered 2020-09-08: 100 meq via INTRAVENOUS
  Filled 2020-09-08: qty 50

## 2020-09-08 MED ORDER — SODIUM BICARBONATE 8.4 % IV SOLN
INTRAVENOUS | Status: AC
  Filled 2020-09-08: qty 50

## 2020-09-08 MED ORDER — STERILE WATER FOR INJECTION IJ SOLN
10.0000 ng/kg/min | INTRAVENOUS | Status: DC
  Administered 2020-09-08: 20 ng/kg/min via RESPIRATORY_TRACT
  Administered 2020-09-09: 10 ng/kg/min via RESPIRATORY_TRACT
  Filled 2020-09-08 (×2): qty 5

## 2020-09-08 MED ORDER — POTASSIUM CHLORIDE 20 MEQ PO PACK
40.0000 meq | PACK | Freq: Once | ORAL | Status: AC
  Administered 2020-09-08: 40 meq
  Filled 2020-09-08: qty 2

## 2020-09-08 MED ORDER — FUROSEMIDE 10 MG/ML IJ SOLN
80.0000 mg | Freq: Two times a day (BID) | INTRAMUSCULAR | Status: DC
  Administered 2020-09-08: 80 mg via INTRAVENOUS
  Filled 2020-09-08: qty 8

## 2020-09-08 MED ORDER — VASOPRESSIN 20 UNITS/100 ML INFUSION FOR SHOCK
0.0000 [IU]/min | INTRAVENOUS | Status: DC
  Administered 2020-09-08 – 2020-09-11 (×8): 0.03 [IU]/min via INTRAVENOUS
  Filled 2020-09-08 (×7): qty 100

## 2020-09-08 MED ORDER — MIDODRINE HCL 5 MG PO TABS
10.0000 mg | ORAL_TABLET | Freq: Three times a day (TID) | ORAL | Status: DC
  Administered 2020-09-08 – 2020-09-12 (×13): 10 mg
  Filled 2020-09-08 (×13): qty 2

## 2020-09-08 MED ORDER — SODIUM CHLORIDE 0.9 % IV SOLN
2.0000 g | INTRAVENOUS | Status: AC
  Administered 2020-09-08 – 2020-09-12 (×5): 2 g via INTRAVENOUS
  Filled 2020-09-08: qty 2
  Filled 2020-09-08: qty 20
  Filled 2020-09-08: qty 2
  Filled 2020-09-08: qty 20
  Filled 2020-09-08: qty 2

## 2020-09-08 MED ORDER — DOBUTAMINE IN D5W 4-5 MG/ML-% IV SOLN
2.5000 ug/kg/min | INTRAVENOUS | Status: DC
  Administered 2020-09-08: 2.5 ug/kg/min via INTRAVENOUS
  Filled 2020-09-08: qty 250

## 2020-09-08 NOTE — Progress Notes (Signed)
Inhaled epoprostenol (VELETRI) Monitoring:  Current dose:  20 ng/kg/min and 2.64 mL/hr IBW: 66.1 kg Duration of each syringe (at current rate): 18.9 hr (9.1 hrs left of current syringe) Last syringe hung (date/time): 09/08/20 at 0657 (prior to wean)  Next syringe due (date/time): 09/08/20 at 2130 Backup on unit: Yes  Current plan:  Weaning   Richardine Service, PharmD, [facility] PGY2 Cardiology Pharmacy Resident Phone: [phone] 09/08/2020  12:31 PM  Please check [email or web address] for unit-specific pharmacy phone numbers.

## 2020-09-08 NOTE — Progress Notes (Addendum)
eLink Physician-Brief Progress Note Patient Name: Gary [name] DOB: 1950/06/28 MRN: [id number]   Date of Service  09/08/2020  HPI/Events of Note  Multiple issues: 1. Hypotension - BP = 89/65 with MAP = 73 on a Norepinephrine IV infusion at 35 mcg/min and 2. ABG on 100%/PRVC 35/TV520/P 12 = 7.263/51.2/104/22.7  eICU Interventions  Plan: 1. NaHCO3 100 meq IV now. 2. NaHCO3 IV infusion at 75 mL/hour.  3. Repeat ABG at 5 AM. 4. Increase ceiling on Norepinephrine IV infusion to 60 mcg/min. 5. Vasopressin IV infusion at shock dose.  6. Monitor CVP now and Q 4 hours. 7. Wean Propofol IV infusion off as tolerated. 8. Titrate Fentanyl IV infusion for RASS = 0 to -1.     Intervention Category Major Interventions: Acid-Base disturbance - evaluation and management;Respiratory failure - evaluation and management  Lysle Dingwall 09/08/2020, 12:46 AM

## 2020-09-08 NOTE — Progress Notes (Signed)
Did not do well with veletri wean form 10 to 0 (more tachy, hypotensive, desats), discussed with CHF, will start revatio 20 tid and try to wean slower tomorrow with coox monitoring.  Erskine Emery MD

## 2020-09-08 NOTE — Progress Notes (Signed)
Progress Note    ASSESSMENT AND PLAN:   UGI bleed d/t large duodenal ulcer. Hb 13 to 6.7 s/p 2U 8.6.  Erosive gastritis Small hiatal hernia with short segment Barrett's A Fib off eliquis d/t GI bleed Shock-cardiogenic/septic PAH with cor pulmonale.  Still intubated.   Plan: -Continue IV Protonix drip for another 48 hours, then can transition to 40 mg IV twice daily -Trend CBC -Follow bx for H. Pylori -Continue supportive care -Will standby. -Recommend repeat EGD in 3-4 mts as outpt, only if clinical status permits     SUBJECTIVE  He remains intubated. No further GI bleeding Developed fever overnight Hypotensive on pressors   OBJECTIVE:     Vital signs in last 24 hours: Temp:  [98.42 F (36.9 C)-101.66 F (38.7 C)] 98.42 F (36.9 C) (04/23 0700) Pulse Rate:  [5-114] 89 (04/23 0700) Resp:  [15-35] 35 (04/23 0700) BP: (82-126)/(51-108) 115/73 (04/23 0700) SpO2:  [86 %-100 %] 96 % (04/23 0743) Arterial Line BP: (76-124)/(47-86) 116/58 (04/23 0700) FiO2 (%):  [60 %-100 %] 60 % (04/23 0743) Last BM Date: 09/06/20 General: Intubated on pressors Abdomen:  Soft, nondistended, nontender.  Normal bowel sounds,.         Intake/Output from previous day: 04/22 0701 - 04/23 0700 In: 2111.4 [I.V.:1428.9; Blood:630; IV Piggyback:52.5] Out: 725 [Urine:725] Intake/Output this shift: No intake/output data recorded.  Lab Results: Recent Labs    09/07/20 0537 09/07/20 1334 09/07/20 1459 09/07/20 2236 09/08/20 0240 09/08/20 0441  WBC 12.6* 12.7*  --   --  15.9*  --   HGB 6.7* 8.3*   < > 8.8* 8.6* 8.8*  HCT 20.1* 25.1*   < > 26.0* 25.8* 26.0*  PLT 232 226  --   --  289  --    < > = values in this interval not displayed.   BMET Recent Labs    09/06/20 1247 09/07/20 0537 09/07/20 1459 09/07/20 2236 09/08/20 0240 09/08/20 0441  NA 131* 131*   < > 136 137 137  K 4.6 4.8   < > 4.6 4.0 4.1  CL 101 102  --   --  102  --   CO2 25 23  --   --  26  --    GLUCOSE 162* 199*  --   --  71  --   BUN 67* 64*  --   --  57*  --   CREATININE 1.12 1.14  --   --  1.52*  --   CALCIUM 7.6* 7.5*  --   --  7.5*  --    < > = values in this interval not displayed.   LFT No results for input(s): PROT, ALBUMIN, AST, ALT, ALKPHOS, BILITOT, BILIDIR, IBILI in the last 72 hours. PT/INR Recent Labs    09/07/20 1334  LABPROT 18.1*  INR 1.5*   Hepatitis Panel No results for input(s): HEPBSAG, HCVAB, HEPAIGM, HEPBIGM in the last 72 hours.  CT ABDOMEN PELVIS WO CONTRAST  Result Date: 09/06/2020 CLINICAL DATA:  New onset anemia. Patient on anticoagulation. Rule out retroperitoneal bleed. EXAM: CT ABDOMEN AND PELVIS WITHOUT CONTRAST TECHNIQUE: Multidetector CT imaging of the abdomen and pelvis was performed following the standard protocol without IV contrast. COMPARISON:  February 12, 2020 and September 03, 2020 FINDINGS: Lower chest: Small to moderate RIGHT effusion similar to recent CT of the chest Basilar septal thickening with similar appearance when compared to recent imaging. Airspace disease in the RIGHT lower chest and LEFT lung  base with improvement. Resolution of LEFT-sided pleural fluid. Heart size enlarged. Low-attenuation in cardiac chambers compatible with given history of anemia. Hepatobiliary: No focal, suspicious hepatic lesion on noncontrast imaging. No pericholecystic stranding. Pancreas: Pancreatic atrophy.  No signs of inflammation. Spleen: Spleen normal size and contour. Adrenals/Urinary Tract: Adrenal glands are normal. Mild perinephric stranding bilaterally. No sign of hydronephrosis. Urinary bladder is partially collapsed. No nephrolithiasis. No ureteral calculi. Stomach/Bowel: Fluid in the stomach no perigastric stranding. Small bowel is normal caliber. Small-bowel now contained within a moderately large ventral hernia with moderate rectus diastasis at the site of the hernia. Diastasis at 4.9 cm. No sign of bowel obstruction related to these findings.  Fat with herniated through this area on the prior study. Colonic diverticulosis. No sign of diverticulitis. Appendix not visualized though no secondary signs to suggest acute appendicitis. Vascular/Lymphatic: Calcified atheromatous plaque of the abdominal aorta. No aneurysmal dilation. Smooth contour of the IVC. There is no gastrohepatic or hepatoduodenal ligament lymphadenopathy. No retroperitoneal or mesenteric lymphadenopathy. No pelvic sidewall lymphadenopathy. Streak artifact from sacroiliac screws limit assessment. Reproductive: Unremarkable on limited assessment. Other: Bilateral fat containing inguinal hernias and moderately large ventral hernia containing bowel and fat without obstruction. Musculoskeletal: No expansion of abdominal wall musculature. No expansion of the psoas musculature or other visualized muscular structures. No signs to indicate retroperitoneal hematoma. ORIF of previous sacroiliac diastasis. No destructive bone finding or acute bone process. IMPRESSION: 1. No signs of retroperitoneal hematoma. 2. Small to moderate RIGHT effusion similar to recent CT of the chest. Septal thickening may be indicative of volume overload or signs of heart failure. Areas of ground-glass and airspace disease with some improvement may be indicative of improving superimposed pneumonitis. 3. Airspace disease in the RIGHT lower chest and LEFT lung base with improvement. 4. Moderately large ventral hernia containing bowel and fat without obstruction. 5. Colonic diverticulosis without evidence of diverticulitis. 6. Aortic atherosclerosis. Electronically Signed   By: Zetta Bills M.D.   On: 09/06/2020 16:59   DG Chest 1 View  Result Date: 09/07/2020 CLINICAL DATA:  ARDS.  Intubation. EXAM: CHEST  1 VIEW COMPARISON:  One-view chest x-ray 09/07/2020 at 11:26 a.m. FINDINGS: The heart is enlarged. Patient is now intubated. The endotracheal tube terminates 2.5 cm above the carina. A left IJ line is in place. The tip  is above the cavoatrial junction. No pneumothorax is present. Atherosclerotic calcifications are present at the aortic arch. Bibasilar airspace opacities have increased. Airspace opacity in the right upper lung is improved. IMPRESSION: 1. Interval intubation. The endotracheal tube terminates 2.5 cm above the carina. 2. Satisfactory placement of left IJ line. 3. Improving right upper lobe airspace disease. 4. Increasing bibasilar airspace opacities. Findings of shifting opacities consistent with ARDS and possible edema. Electronically Signed   By: San Morelle M.D.   On: 09/07/2020 15:54   DG CHEST PORT 1 VIEW  Result Date: 09/07/2020 CLINICAL DATA:  Central line placement EXAM: PORTABLE CHEST 1 VIEW COMPARISON:  09/07/2020 FINDINGS: Endotracheal tube is been placed and is in good position Left jugular central venous catheter tip in the lower SVC. No pneumothorax Extensive bilateral airspace disease right greater than left unchanged. IMPRESSION: Endotracheal tube and central line in good position. No pneumothorax Bilateral airspace disease right greater than left unchanged from earlier today. Electronically Signed   By: Franchot Gallo M.D.   On: 09/07/2020 14:48   DG CHEST PORT 1 VIEW  Result Date: 09/07/2020 CLINICAL DATA:  Hypoxia EXAM: PORTABLE CHEST 1 VIEW COMPARISON:  09/06/2020 FINDINGS: Cardiac enlargement. Asymmetric airspace disease on the right involving the upper lobe and lower lobe with mild progression. Mild left lower lobe airspace disease. No effusion. Atherosclerotic calcification aortic arch IMPRESSION: Asymmetric airspace disease on the right with mild progression. Possible pneumonia or asymmetric edema. Cardiac enlargement. Electronically Signed   By: Franchot Gallo M.D.   On: 09/07/2020 13:22   DG CHEST PORT 1 VIEW  Result Date: 09/06/2020 CLINICAL DATA:  Hypotension.  Shortness of breath. EXAM: PORTABLE CHEST 1 VIEW COMPARISON:  Chest x-ray 09/05/2020. FINDINGS: Stable  cardiomegaly. Persistent bilateral pulmonary infiltrates with progressive infiltrate right upper lung. Tiny bilateral pleural effusions cannot be excluded. No pneumothorax. Degenerative changes scoliosis thoracic spine. IMPRESSION: 1.  Stable cardiomegaly. 2. Persistent bilateral pulmonary infiltrates with progressive infiltrate right upper lung. Tiny bilateral pleural effusions cannot be excluded. Electronically Signed   By: Marcello Moores  Register   On: 09/06/2020 12:43     Principal Problem:   Acute hypoxemic respiratory failure ([place]) Active Problems:   CHF exacerbation ([place])   Hyperlipidemia due to type 2 diabetes mellitus ([place])   Atrial fibrillation ([place])   Right bundle branch block     LOS: 4 days     Carmell Austria, MD 09/08/2020, 8:12 AM [facility] [phone]

## 2020-09-08 NOTE — Progress Notes (Signed)
Patient ID: Gary [name], male   DOB: [birth year], 70 y.o.   MRN: [id number]     Advanced Heart Failure Rounding Note  PCP-Cardiologist: Elouise Munroe, MD   Subjective:    Remains in atrial fibrillation HR 90s.    Developed melena. Eliquis stopped. CT with no RP bleed.  EGD 4/22 showed 2 ulcers in duodenum that were not actively bleeding.  He remains on Protonix gtt, no overt bleeding today.  With 2 units PRBCs, hgb up to 8.6.   Intubated yesterday with volume load and EGD.  CXR with bibasilar airspace disease.  FiO2 down to 0.6 this morning.  CVP 19 currently.   Hypotensive overnight, started on NE currently at 30 and vasopressin 0.03.  Veletri started overnight with history of severe PH.  MAP currently stable.   Febrile to 101.6 overnight.   Objective:   Weight Range: 86.6 kg Body mass index is 29.9 kg/m.   Vital Signs:   Temp:  [98.42 F (36.9 C)-101.66 F (38.7 C)] 98.42 F (36.9 C) (04/23 0700) Pulse Rate:  [5-114] 89 (04/23 0700) Resp:  [15-35] 35 (04/23 0700) BP: (82-126)/(51-108) 115/73 (04/23 0700) SpO2:  [86 %-100 %] 96 % (04/23 0743) Arterial Line BP: (76-124)/(47-86) 116/58 (04/23 0700) FiO2 (%):  [60 %-100 %] 60 % (04/23 0743) Last BM Date: 09/06/20  Weight change: Filed Weights   09/05/20 0308 09/06/20 0530 09/07/20 0100  Weight: 86.9 kg 86.5 kg 86.6 kg    Intake/Output:   Intake/Output Summary (Last 24 hours) at 09/08/2020 0755 Last data filed at 09/08/2020 0700 Gross per 24 hour  Intake 2111.42 ml  Output 725 ml  Net 1386.42 ml      Physical Exam   General: NAD Neck: Thick, JVP difficult, no thyromegaly or thyroid nodule.  Lungs: Decreased at bases.  CV: Nondisplaced PMI.  Heart irregular S1/S2, no S3/S4, no murmur.  1+ ankle edema.   Abdomen: Soft, nontender, no hepatosplenomegaly, no distention.  Skin: Intact without lesions or rashes.  Neurologic:Sedated on vent but awakens.  Extremities: No clubbing or cyanosis.  HEENT: Normal.     Telemetry   A fib 80-90s Personally reviewed   Labs    CBC Recent Labs    09/07/20 1334 09/07/20 1459 09/08/20 0240 09/08/20 0441  WBC 12.7*  --  15.9*  --   HGB 8.3*   < > 8.6* 8.8*  HCT 25.1*   < > 25.8* 26.0*  MCV 93.3  --  93.1  --   PLT 226  --  289  --    < > = values in this interval not displayed.   Basic Metabolic Panel Recent Labs    09/06/20 0452 09/06/20 1247 09/07/20 0537 09/07/20 1459 09/08/20 0240 09/08/20 0441  NA 135   < > 131*   < > 137 137  K 4.5   < > 4.8   < > 4.0 4.1  CL 101   < > 102  --  102  --   CO2 26   < > 23  --  26  --   GLUCOSE 184*   < > 199*  --  71  --   BUN 57*   < > 64*  --  57*  --   CREATININE 1.14   < > 1.14  --  1.52*  --   CALCIUM 8.2*   < > 7.5*  --  7.5*  --   MG 1.6*  --  1.7  --   --   --    < > =  values in this interval not displayed.   Liver Function Tests No results for input(s): AST, ALT, ALKPHOS, BILITOT, PROT, ALBUMIN in the last 72 hours. No results for input(s): LIPASE, AMYLASE in the last 72 hours. Cardiac Enzymes No results for input(s): CKTOTAL, CKMB, CKMBINDEX, TROPONINI in the last 72 hours.  BNP: BNP (last 3 results) Recent Labs    09/03/20 1433  BNP 1,202.7*    ProBNP (last 3 results) No results for input(s): PROBNP in the last 8760 hours.   D-Dimer No results for input(s): DDIMER in the last 72 hours. Hemoglobin A1C No results for input(s): HGBA1C in the last 72 hours. Fasting Lipid Panel Recent Labs    09/08/20 0240  TRIG 73   Thyroid Function Tests No results for input(s): TSH, T4TOTAL, T3FREE, THYROIDAB in the last 72 hours.  Invalid input(s): FREET3  Other results:   Imaging    DG Chest 1 View  Result Date: 09/07/2020 CLINICAL DATA:  ARDS.  Intubation. EXAM: CHEST  1 VIEW COMPARISON:  One-view chest x-ray 09/07/2020 at 11:26 a.m. FINDINGS: The heart is enlarged. Patient is now intubated. The endotracheal tube terminates 2.5 cm above the carina. A left IJ line is in  place. The tip is above the cavoatrial junction. No pneumothorax is present. Atherosclerotic calcifications are present at the aortic arch. Bibasilar airspace opacities have increased. Airspace opacity in the right upper lung is improved. IMPRESSION: 1. Interval intubation. The endotracheal tube terminates 2.5 cm above the carina. 2. Satisfactory placement of left IJ line. 3. Improving right upper lobe airspace disease. 4. Increasing bibasilar airspace opacities. Findings of shifting opacities consistent with ARDS and possible edema. Electronically Signed   By: San Morelle M.D.   On: 09/07/2020 15:54   DG CHEST PORT 1 VIEW  Result Date: 09/07/2020 CLINICAL DATA:  Central line placement EXAM: PORTABLE CHEST 1 VIEW COMPARISON:  09/07/2020 FINDINGS: Endotracheal tube is been placed and is in good position Left jugular central venous catheter tip in the lower SVC. No pneumothorax Extensive bilateral airspace disease right greater than left unchanged. IMPRESSION: Endotracheal tube and central line in good position. No pneumothorax Bilateral airspace disease right greater than left unchanged from earlier today. Electronically Signed   By: Franchot Gallo M.D.   On: 09/07/2020 14:48   DG CHEST PORT 1 VIEW  Result Date: 09/07/2020 CLINICAL DATA:  Hypoxia EXAM: PORTABLE CHEST 1 VIEW COMPARISON:  09/06/2020 FINDINGS: Cardiac enlargement. Asymmetric airspace disease on the right involving the upper lobe and lower lobe with mild progression. Mild left lower lobe airspace disease. No effusion. Atherosclerotic calcification aortic arch IMPRESSION: Asymmetric airspace disease on the right with mild progression. Possible pneumonia or asymmetric edema. Cardiac enlargement. Electronically Signed   By: Franchot Gallo M.D.   On: 09/07/2020 13:22     Medications:     Scheduled Medications: . sodium chloride   Intravenous Once  . atorvastatin  40 mg Per Tube Daily  . chlorhexidine gluconate (MEDLINE KIT)  15 mL  Mouth Rinse BID  . Chlorhexidine Gluconate Cloth  6 each Topical Daily  . furosemide  80 mg Intravenous BID  . insulin aspart  0-9 Units Subcutaneous TID WC  . mouth rinse  15 mL Mouth Rinse 10 times per day  . [START ON 09/10/2020] pantoprazole  40 mg Intravenous Q12H  . sodium bicarbonate        Infusions: . sodium chloride    . sodium chloride 10 mL/hr at 09/08/20 0700  . DOBUTamine    .  epoprostenol (VELETRI) for inhalation 1.64m/50mL (30,000 ng/mL) 50 ng/kg/min (09/08/20 0657)  . fentaNYL infusion INTRAVENOUS 75 mcg/hr (09/08/20 0700)  . norepinephrine (LEVOPHED) Adult infusion 30 mcg/min (09/08/20 0700)  . pantoprozole (PROTONIX) infusion 8 mg/hr (09/08/20 0700)  . propofol (DIPRIVAN) infusion 20 mcg/kg/min (09/08/20 0700)  . vasopressin 0.03 Units/min (09/08/20 0700)    PRN Medications: sodium chloride, acetaminophen, fentaNYL, ondansetron (ZOFRAN) IV    Assessment/Plan    1. Acute upper GI bleed with symptomatic anemia - hgb down to 6.7 + melena on 4/22, had 2 units PRBCs with hgb 8.6 today.  - Eliquis off - CT negative for RP bleed - On Protonix gtt. - EGD with 2 ulcers not acutely bleeding.  - No overt bleeding overnight.   2. PAH with cor pulmonale - CT chest 4/22: No PE or ILD - Echo LVEF 60% severe RV dilation and HK - Cath with moderate PAH. PA = 66/31 (46) PCW = 13 Fick = 4.1/2.1 PVR = 7.8 WU - Suspect this is WHO Group 3 PAH due to hypoxic lung disease/OHS/OSA - Will order PFTs (when better compensated) and will need outpatient sleep study - Check serologies for completeness sake>> in process  - Will need home O2.  - Now with volume overload CVP 19 and pulmonary edema on CXR post-resuscitation for GI bleeding. Will give Lasix 80 mg IV bid x 2 doses then reassess.  - On Veletri currently with shock overnight, weaning down and will add dobutamine 2.5 mcg/kg/min for temporary RV support.  Will send co-ox now.   3. Paroxysmal AF - new onset. Unclear  duration  - Rate controlled.  - Eliquis on hold d/t GI bleed - Continue with rate control for now. No DC-CV for now   4. CAD - No chest pain.   -mostly non-obstructive - atorva 40 added  - No ASA with AC  4. R lung mass - will need CT f/u   5. Shock - Suspect mixed hemorrhagic/cardiogenic (RV failure).  May have septic/distributive component with fever.  Improving currently and NE weaning down.  Also remains on vasopressin 0.03.  Will add dobutamine 2.5 as above for RV support as we wean pressors.   6. ID - Febrile overnight to 101.6.  Cannot rule out PNA on CXR.   - Send blood cultures/trach aspirate.   - Procalcitonin - ?Empiric abx (per CCM)  7. Acute hypoxemic respiratory failure - Intubated pre-EGD on 4/22.   - Suspect pulmonary edema, ?PNA.   CRITICAL CARE Performed by: DLoralie Champagne Total critical care time: 45 minutes  Critical care time was exclusive of separately billable procedures and treating other patients.  Critical care was necessary to treat or prevent imminent or life-threatening deterioration.  Critical care was time spent personally by me (independent of midlevel providers or residents) on the following activities: development of treatment plan with patient and/or surrogate as well as nursing, discussions with consultants, evaluation of patient's response to treatment, examination of patient, obtaining history from patient or surrogate, ordering and performing treatments and interventions, ordering and review of laboratory studies, ordering and review of radiographic studies, pulse oximetry and re-evaluation of patient's condition.    Length of Stay: 4[place] MD  09/08/2020, 7:55 AM  Advanced Heart Failure Team Pager [phone](M-F; 7a - 5p)  Please contact [facility] for night-coverage after hours (5p -7a ) and weekends on [email or web address]

## 2020-09-08 NOTE — Progress Notes (Signed)
Progress Note  Patient Name: Gary [name] Date of Encounter: 09/08/2020  [facility] Cardiologist: Elouise Munroe, MD   Subjective    S/p EGD yesterday showing 2 ulcers. 2u PRBC transfused.  Vent weaning ongoing  Hypotensive requiring levophed and vaso.  AF   Inpatient Medications    Scheduled Meds: . sodium chloride   Intravenous Once  . atorvastatin  40 mg Per Tube Daily  . chlorhexidine gluconate (MEDLINE KIT)  15 mL Mouth Rinse BID  . Chlorhexidine Gluconate Cloth  6 each Topical Daily  . furosemide  80 mg Intravenous BID  . insulin aspart  0-9 Units Subcutaneous TID WC  . mouth rinse  15 mL Mouth Rinse 10 times per day  . [START ON 09/10/2020] pantoprazole  40 mg Intravenous Q12H  . sodium bicarbonate       Continuous Infusions: . sodium chloride    . sodium chloride 10 mL/hr at 09/08/20 0900  . DOBUTamine 2.5 mcg/kg/min (09/08/20 0900)  . epoprostenol (VELETRI) for inhalation 1.67m/50mL (30,000 ng/mL) 50 ng/kg/min (09/08/20 0657)  . fentaNYL infusion INTRAVENOUS 75 mcg/hr (09/08/20 0900)  . norepinephrine (LEVOPHED) Adult infusion 30 mcg/min (09/08/20 0900)  . pantoprozole (PROTONIX) infusion 8 mg/hr (09/08/20 0900)  . propofol (DIPRIVAN) infusion 15 mcg/kg/min (09/08/20 0913)  . vasopressin 0.03 Units/min (09/08/20 0923)   PRN Meds: sodium chloride, acetaminophen, fentaNYL, ondansetron (ZOFRAN) IV   Vital Signs    Vitals:   09/08/20 0657 09/08/20 0700 09/08/20 0743 09/08/20 0800  BP:  115/73  93/62  Pulse:  89  82  Resp:  (!) 35  (!) 35  Temp:  98.42 F (36.9 C)  98.6 F (37 C)  TempSrc:      SpO2: 100% 100% 96% 95%  Weight:      Height:        Intake/Output Summary (Last 24 hours) at 09/08/2020 0930 Last data filed at 09/08/2020 0900 Gross per 24 hour  Intake 2398.06 ml  Output 850 ml  Net 1548.06 ml   Last 3 Weights 09/07/2020 09/06/2020 09/05/2020  Weight (lbs) 190 lb 14.4 oz 190 lb 11.2 oz 191 lb 8 oz  Weight (kg) 86.592 kg 86.501 kg  86.864 kg      Telemetry    AF w/ RVR - Personally Reviewed  ECG    No new - Personally Reviewed  Physical Exam   GEN: No acute distress.   Neck: JVD not visible due to positioning Cardiac: tachycardic, irregularly irregular Respiratory: intubated. GI: Soft, non-distended  MS: mild 1+ bilateral LE pitting edema Neuro:  sedated Psych: Normal affect   Labs    High Sensitivity Troponin:   Recent Labs  Lab 09/03/20 1040 09/03/20 1433 09/03/20 1803  TROPONINIHS 98* 101* 98*      Chemistry Recent Labs  Lab 09/06/20 1247 09/07/20 0537 09/07/20 1459 09/07/20 2236 09/08/20 0240 09/08/20 0441  NA 131* 131*   < > 136 137 137  K 4.6 4.8   < > 4.6 4.0 4.1  CL 101 102  --   --  102  --   CO2 25 23  --   --  26  --   GLUCOSE 162* 199*  --   --  71  --   BUN 67* 64*  --   --  57*  --   CREATININE 1.12 1.14  --   --  1.52*  --   CALCIUM 7.6* 7.5*  --   --  7.5*  --   GFRNONAA >60 >  60  --   --  49*  --   ANIONGAP 5 6  --   --  9  --    < > = values in this interval not displayed.     Hematology Recent Labs  Lab 09/07/20 0537 09/07/20 1334 09/07/20 1459 09/07/20 2236 09/08/20 0240 09/08/20 0441  WBC 12.6* 12.7*  --   --  15.9*  --   RBC 2.13* 2.69*  --   --  2.77*  --   HGB 6.7* 8.3*   < > 8.8* 8.6* 8.8*  HCT 20.1* 25.1*   < > 26.0* 25.8* 26.0*  MCV 94.4 93.3  --   --  93.1  --   MCH 31.5 30.9  --   --  31.0  --   MCHC 33.3 33.1  --   --  33.3  --   RDW 13.8 14.4  --   --  15.0  --   PLT 232 226  --   --  289  --    < > = values in this interval not displayed.    BNP Recent Labs  Lab 09/03/20 1433  BNP 1,202.7*     DDimer No results for input(s): DDIMER in the last 168 hours.   Radiology    CT ABDOMEN PELVIS WO CONTRAST  Result Date: 09/06/2020 CLINICAL DATA:  New onset anemia. Patient on anticoagulation. Rule out retroperitoneal bleed. EXAM: CT ABDOMEN AND PELVIS WITHOUT CONTRAST TECHNIQUE: Multidetector CT imaging of the abdomen and pelvis was  performed following the standard protocol without IV contrast. COMPARISON:  February 12, 2020 and September 03, 2020 FINDINGS: Lower chest: Small to moderate RIGHT effusion similar to recent CT of the chest Basilar septal thickening with similar appearance when compared to recent imaging. Airspace disease in the RIGHT lower chest and LEFT lung base with improvement. Resolution of LEFT-sided pleural fluid. Heart size enlarged. Low-attenuation in cardiac chambers compatible with given history of anemia. Hepatobiliary: No focal, suspicious hepatic lesion on noncontrast imaging. No pericholecystic stranding. Pancreas: Pancreatic atrophy.  No signs of inflammation. Spleen: Spleen normal size and contour. Adrenals/Urinary Tract: Adrenal glands are normal. Mild perinephric stranding bilaterally. No sign of hydronephrosis. Urinary bladder is partially collapsed. No nephrolithiasis. No ureteral calculi. Stomach/Bowel: Fluid in the stomach no perigastric stranding. Small bowel is normal caliber. Small-bowel now contained within a moderately large ventral hernia with moderate rectus diastasis at the site of the hernia. Diastasis at 4.9 cm. No sign of bowel obstruction related to these findings. Fat with herniated through this area on the prior study. Colonic diverticulosis. No sign of diverticulitis. Appendix not visualized though no secondary signs to suggest acute appendicitis. Vascular/Lymphatic: Calcified atheromatous plaque of the abdominal aorta. No aneurysmal dilation. Smooth contour of the IVC. There is no gastrohepatic or hepatoduodenal ligament lymphadenopathy. No retroperitoneal or mesenteric lymphadenopathy. No pelvic sidewall lymphadenopathy. Streak artifact from sacroiliac screws limit assessment. Reproductive: Unremarkable on limited assessment. Other: Bilateral fat containing inguinal hernias and moderately large ventral hernia containing bowel and fat without obstruction. Musculoskeletal: No expansion of  abdominal wall musculature. No expansion of the psoas musculature or other visualized muscular structures. No signs to indicate retroperitoneal hematoma. ORIF of previous sacroiliac diastasis. No destructive bone finding or acute bone process. IMPRESSION: 1. No signs of retroperitoneal hematoma. 2. Small to moderate RIGHT effusion similar to recent CT of the chest. Septal thickening may be indicative of volume overload or signs of heart failure. Areas of ground-glass and airspace disease with some improvement  may be indicative of improving superimposed pneumonitis. 3. Airspace disease in the RIGHT lower chest and LEFT lung base with improvement. 4. Moderately large ventral hernia containing bowel and fat without obstruction. 5. Colonic diverticulosis without evidence of diverticulitis. 6. Aortic atherosclerosis. Electronically Signed   By: Zetta Bills M.D.   On: 09/06/2020 16:59   DG Chest 1 View  Result Date: 09/07/2020 CLINICAL DATA:  ARDS.  Intubation. EXAM: CHEST  1 VIEW COMPARISON:  One-view chest x-ray 09/07/2020 at 11:26 a.m. FINDINGS: The heart is enlarged. Patient is now intubated. The endotracheal tube terminates 2.5 cm above the carina. A left IJ line is in place. The tip is above the cavoatrial junction. No pneumothorax is present. Atherosclerotic calcifications are present at the aortic arch. Bibasilar airspace opacities have increased. Airspace opacity in the right upper lung is improved. IMPRESSION: 1. Interval intubation. The endotracheal tube terminates 2.5 cm above the carina. 2. Satisfactory placement of left IJ line. 3. Improving right upper lobe airspace disease. 4. Increasing bibasilar airspace opacities. Findings of shifting opacities consistent with ARDS and possible edema. Electronically Signed   By: San Morelle M.D.   On: 09/07/2020 15:54   DG CHEST PORT 1 VIEW  Result Date: 09/07/2020 CLINICAL DATA:  Central line placement EXAM: PORTABLE CHEST 1 VIEW COMPARISON:   09/07/2020 FINDINGS: Endotracheal tube is been placed and is in good position Left jugular central venous catheter tip in the lower SVC. No pneumothorax Extensive bilateral airspace disease right greater than left unchanged. IMPRESSION: Endotracheal tube and central line in good position. No pneumothorax Bilateral airspace disease right greater than left unchanged from earlier today. Electronically Signed   By: Franchot Gallo M.D.   On: 09/07/2020 14:48   DG CHEST PORT 1 VIEW  Result Date: 09/07/2020 CLINICAL DATA:  Hypoxia EXAM: PORTABLE CHEST 1 VIEW COMPARISON:  09/06/2020 FINDINGS: Cardiac enlargement. Asymmetric airspace disease on the right involving the upper lobe and lower lobe with mild progression. Mild left lower lobe airspace disease. No effusion. Atherosclerotic calcification aortic arch IMPRESSION: Asymmetric airspace disease on the right with mild progression. Possible pneumonia or asymmetric edema. Cardiac enlargement. Electronically Signed   By: Franchot Gallo M.D.   On: 09/07/2020 13:22   DG CHEST PORT 1 VIEW  Result Date: 09/06/2020 CLINICAL DATA:  Hypotension.  Shortness of breath. EXAM: PORTABLE CHEST 1 VIEW COMPARISON:  Chest x-ray 09/05/2020. FINDINGS: Stable cardiomegaly. Persistent bilateral pulmonary infiltrates with progressive infiltrate right upper lung. Tiny bilateral pleural effusions cannot be excluded. No pneumothorax. Degenerative changes scoliosis thoracic spine. IMPRESSION: 1.  Stable cardiomegaly. 2. Persistent bilateral pulmonary infiltrates with progressive infiltrate right upper lung. Tiny bilateral pleural effusions cannot be excluded. Electronically Signed   By: Marcello Moores  Register   On: 09/06/2020 12:43       Assessment & Plan    69yo critically ill man a/w acute UGIB/anemia now s/p EGD and PRBC transfusion. Hx of PAH and cor pulmonale being followed by the HF team.  #Acute UGIB Monitor H/H  #Cor Pulmonale AHF following. Starting veletri and dobutamine for  RV support.  #pAF - not rate controlled - suspect this is related to acute illness and cor pulmonale - not on AC due to GI bleeding and anemia - do NOT recommend cardioversion at this time - rate control going to be difficult in setting of RV failure / pressors - if no improvement off dobutamine, and he remains hemodynamically compromised from the AF, could consider starting digoxin (renally dosed). Amiodarone could be used but  there is a risk of pharmacologically cardioverting him with this drug.   For questions or updates, please contact [place] Please consult [email or web address] for contact info under        Signed, Vickie Epley, MD  09/08/2020, 9:30 AM

## 2020-09-08 NOTE — Progress Notes (Signed)
NAME:  Gary [name], MRN:  [id number], DOB:  05-10-1951, LOS: 4 ADMISSION DATE:  09/03/2020, CONSULTATION DATE:  09/07/2020 REFERRING MD:  Nori Riis, CHIEF COMPLAINT:  GI Bleed   History of Present Illness:  70 y/o M who presented to [facility] on 4/18 with complaints of shortness of breath.    The patient lives at home with his wife.  He is independent of all ADL's.  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  He is a former smoker - smoked from 1968-2005 off / on, heaviest 1.5 ppd.  He grew up in [place], [place] with coal burning stove.  He has multiple hobbies to include Civil War reenactment with black powder rifles, Actuary, & woodworking.  He denies known history of connective tissue disease within family.  His daughter had Turner's syndrome (deceased at age 50 in 70).  The patient reports he had a sleep study in 2016 which did not warrant intervention.  He also reports a significant nosebleed in 2015 that required balloon packing and additional procedure that sounded like embolization by description for bleeding (per ENT in [place]).  No hx of obstructive sleep apnea (had a negative sleep study in the past), no hx of blood clots.   He reports 3 months prior to presentation he had no shortness of breath with activity.  Patient noted in February 2022 that he began having shortness of breath with exertion and activities that normally were not difficult for him (walking uphill to go to church, taking the dogs out).  He also noted feeling full early in meals.  His wife reports that in the last 2 to 3 weeks he has had significant shortness of breath, decreased p.o. intake.  She noted that on 4/16 and 4/17 he was so short of breath to the point that he did not talk much over the weekend prompting evaluation on 4/18.  He reported difficulty walking into the emergency room.  His wife reported that he had poor  coloring with a grayish appearance.    The patient was admitted per teaching service for further evaluation.  Initially required 4 L of oxygen to maintain oxygen saturations which was quickly escalated to 15 L salter.  Cardiology was consulted with new finding of atrial fibrillation.  Echo evaluation demonstrated severe RV enlargement with severely depressed RV systolic function and moderately elevated PASP, LVEF 55-60%, difficult to assess wall motion abnormalities but appeared grossly normal, mild concentric left ventricular hypertrophy, RV systolic function severely reduced, RV severely enlarged, moderately elevated pulmonary artery pressure with estimated RV systolic pressure of 09.3, RA moderately dilated.  Given echo findings, a CTA of the chest was evaluated and negative for pulmonary embolism but demonstrated enlarged central pulmonary arteries consistent with pulmonary arterial hypertension, enlargement of the right ventricle and right atrium, small bilateral pleural effusions greater on the right and patchy groundglass opacities bilaterally, unresolved right middle lobe opacity.  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  He is currently on 8 L per HFNC with sats of 95% PCCM consulted for evaluation of hypoxemic respiratory failure and catheterization  findings.   Pertinent  Medical History  Former smoker HTN DM Traumatic crush injury from a tree branch with multiple fractures Epistaxis requiring packing   Significant Hospital Events: Including procedures, antibiotic start and stop dates in addition to other pertinent events    4/18 admitted with SOB, echo with  elevated right heart pressures, new finding of A. fib  4/19 R/L heart cath with concern for pulmonary hypertension  4/20 on 15L HFNC  4/21 hypotensive episode, responded to IVF, down to 5L O2  4/22 ABLA, to ICU, intubated, severe hypoxemia; EGD with nonbleeding duodenal ulcer   Interim History / Subjective:  O2 needs coming down. Remains intubated and sedated  Objective   Blood pressure 100/67, pulse (!) 113, temperature 99.14 F (37.3 C), resp. rate (!) 35, height 5' 7"  (1.702 m), weight 86.6 kg, SpO2 97 %. CVP:  [8 mmHg-18 mmHg] 12 mmHg  Vent Mode: PRVC FiO2 (%):  [60 %-100 %] 60 % Set Rate:  [18 bmp-35 bmp] 35 bmp Vt Set:  [520 mL] 520 mL PEEP:  [5 OZY24-82 cmH20] 10 cmH20 Plateau Pressure:  [16 cmH20-26 cmH20] 23 cmH20   Intake/Output Summary (Last 24 hours) at 09/08/2020 1219 Last data filed at 09/08/2020 1146 Gross per 24 hour  Intake 2574.48 ml  Output 1525 ml  Net 1049.48 ml   Filed Weights   09/05/20 0308 09/06/20 0530 09/07/20 0100  Weight: 86.9 kg 86.5 kg 86.6 kg    Examination: Constitutional: no acute distress, intubated sedated Eyes: pupils equal, not tracking Ears, nose, mouth, and throat: ETT in place, minimal secretions Cardiovascular: tachycardic, ext warm Respiratory: rhonci R worse than left, no accessory muscle use Gastrointestinal: soft, +BS Skin: No rashes, normal turgor Neurologic: heavily sedated due to high vent requirements Psychiatric: cannot assess   Labs/imaging that I havepersonally reviewed  (right click and "Reselect all SmartList Selections" daily)     Resolved Hospital Problem list     Assessment & Plan:  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 Afib not presently on [facility] ABLA thought secondary to duodenal ulcer Acute hypoxemic respiratory failure secondary to RV failure and possible R pneumonia Abnormal CT question of RML mass needs OP f/u AKI  - Wean veletri, PEEP, FiO2 - Can consider SAT/SBT later today but  suspect needs one more day due to degree of O2 support at present - Watch H/h, f/u duodenal biopsy, continue PPI - Ceftriaxone - Continue inotropes for now - Diurese as able, appreciate cardiology and CHF help  Best practice (right click and "Reselect all SmartList Selections" daily)  Diet:  NPO Pain/Anxiety/Delirium protocol (if indicated): No VAP protocol (if indicated): Not indicated DVT prophylaxis: PAS hose GI prophylaxis: Protonix gtt Glucose control:  SSI Central venous access:  No Arterial line:  N/A Foley:  N/A Mobility:  bed rest   PT consulted:Yes Last date of multidisciplinary goals of care discussion [4/22] Code Status:  full code Disposition: ICU  35 min cc time Erskine Emery MD PCCM

## 2020-09-08 NOTE — Progress Notes (Addendum)
Inhaled epoprostenol (VELETRI) Monitoring:  Was stopped however, patient had oxygen de-saturations so inhaled epoprostenol restarted.   Current dose:  20 ng/kg/min and 2.64 mL/hr IBW: 66.1 kg Duration of each syringe (at current rate): 18.9 hr (18.9 hrs left of current syringe) Last syringe hung (date/time): 09/08/20 at 2241 Next syringe due (date/time): 09/09/20 at ~1800 Backup on unit: Yes  Current plan: Continue at current dose of 20 ng/kg/min - f/u plans in AM for dose adjustments.   Antonietta Jewel, PharmD, [place] Clinical Pharmacist  Phone: [phone] 09/08/2020 10:52 PM  Please check AMION for all [place] phone numbers After 10:00 PM, call [place] [phone]

## 2020-09-09 DIAGNOSIS — J9601 Acute respiratory failure with hypoxia: Secondary | ICD-10-CM | POA: Diagnosis not present

## 2020-09-09 DIAGNOSIS — I480 Paroxysmal atrial fibrillation: Secondary | ICD-10-CM | POA: Diagnosis not present

## 2020-09-09 LAB — COOXEMETRY PANEL
Carboxyhemoglobin: 1.3 % (ref 0.5–1.5)
Methemoglobin: 0.9 % (ref 0.0–1.5)
O2 Saturation: 70.7 %
Total hemoglobin: 11 g/dL — ABNORMAL LOW (ref 12.0–16.0)

## 2020-09-09 LAB — BASIC METABOLIC PANEL
Anion gap: 12 (ref 5–15)
BUN: 52 mg/dL — ABNORMAL HIGH (ref 8–23)
CO2: 23 mmol/L (ref 22–32)
Calcium: 7.5 mg/dL — ABNORMAL LOW (ref 8.9–10.3)
Chloride: 100 mmol/L (ref 98–111)
Creatinine, Ser: 1.33 mg/dL — ABNORMAL HIGH (ref 0.61–1.24)
GFR, Estimated: 57 mL/min — ABNORMAL LOW (ref >60)
Glucose, Bld: 214 mg/dL — ABNORMAL HIGH (ref 70–99)
Potassium: 4.8 mmol/L (ref 3.5–5.1)
Sodium: 135 mmol/L (ref 135–145)

## 2020-09-09 LAB — GLUCOSE, CAPILLARY
Glucose-Capillary: 168 mg/dL — ABNORMAL HIGH (ref 70–99)
Glucose-Capillary: 204 mg/dL — ABNORMAL HIGH (ref 70–99)
Glucose-Capillary: 221 mg/dL — ABNORMAL HIGH (ref 70–99)
Glucose-Capillary: 232 mg/dL — ABNORMAL HIGH (ref 70–99)
Glucose-Capillary: 309 mg/dL — ABNORMAL HIGH (ref 70–99)

## 2020-09-09 LAB — CBC
HCT: 26 % — ABNORMAL LOW (ref 39.0–52.0)
Hemoglobin: 8.2 g/dL — ABNORMAL LOW (ref 13.0–17.0)
MCH: 30.4 pg (ref 26.0–34.0)
MCHC: 31.5 g/dL (ref 30.0–36.0)
MCV: 96.3 fL (ref 80.0–100.0)
Platelets: 262 10*3/uL (ref 150–400)
RBC: 2.7 MIL/uL — ABNORMAL LOW (ref 4.22–5.81)
RDW: 15.3 % (ref 11.5–15.5)
WBC: 11.7 10*3/uL — ABNORMAL HIGH (ref 4.0–10.5)
nRBC: 0.2 % (ref 0.0–0.2)

## 2020-09-09 LAB — PROCALCITONIN: Procalcitonin: 1.36 ng/mL

## 2020-09-09 MED ORDER — PROSOURCE TF PO LIQD
45.0000 mL | Freq: Two times a day (BID) | ORAL | Status: DC
  Administered 2020-09-09 – 2020-09-10 (×3): 45 mL
  Filled 2020-09-09 (×3): qty 45

## 2020-09-09 MED ORDER — VITAL HIGH PROTEIN PO LIQD
1000.0000 mL | ORAL | Status: DC
  Administered 2020-09-09: 1000 mL

## 2020-09-09 MED ORDER — AMIODARONE HCL IN DEXTROSE 360-4.14 MG/200ML-% IV SOLN
60.0000 mg/h | INTRAVENOUS | Status: DC
  Administered 2020-09-09 (×2): 60 mg/h via INTRAVENOUS
  Filled 2020-09-09: qty 400

## 2020-09-09 MED ORDER — FUROSEMIDE 10 MG/ML IJ SOLN
80.0000 mg | Freq: Two times a day (BID) | INTRAMUSCULAR | Status: AC
  Administered 2020-09-09 (×2): 80 mg via INTRAVENOUS
  Filled 2020-09-09 (×2): qty 8

## 2020-09-09 MED ORDER — AMIODARONE LOAD VIA INFUSION
150.0000 mg | Freq: Once | INTRAVENOUS | Status: AC
  Administered 2020-09-09: 150 mg via INTRAVENOUS
  Filled 2020-09-09: qty 83.34

## 2020-09-09 MED ORDER — CHLORHEXIDINE GLUCONATE CLOTH 2 % EX PADS
6.0000 | MEDICATED_PAD | Freq: Every day | CUTANEOUS | Status: DC
  Administered 2020-09-11 – 2020-09-19 (×11): 6 via TOPICAL

## 2020-09-09 MED ORDER — AMIODARONE HCL IN DEXTROSE 360-4.14 MG/200ML-% IV SOLN
30.0000 mg/h | INTRAVENOUS | Status: DC
  Administered 2020-09-09 – 2020-09-16 (×15): 30 mg/h via INTRAVENOUS
  Filled 2020-09-09 (×16): qty 200

## 2020-09-09 MED ORDER — INSULIN ASPART 100 UNIT/ML ~~LOC~~ SOLN
0.0000 [IU] | SUBCUTANEOUS | Status: DC
  Administered 2020-09-09: 16 [IU] via SUBCUTANEOUS
  Administered 2020-09-09: 8 [IU] via SUBCUTANEOUS
  Administered 2020-09-10: 12 [IU] via SUBCUTANEOUS
  Administered 2020-09-10: 4 [IU] via SUBCUTANEOUS
  Administered 2020-09-10: 8 [IU] via SUBCUTANEOUS
  Administered 2020-09-10: 4 [IU] via SUBCUTANEOUS
  Administered 2020-09-10 (×2): 8 [IU] via SUBCUTANEOUS
  Administered 2020-09-11 (×5): 4 [IU] via SUBCUTANEOUS
  Administered 2020-09-11: 12 [IU] via SUBCUTANEOUS
  Administered 2020-09-12 (×2): 4 [IU] via SUBCUTANEOUS
  Administered 2020-09-12 (×3): 8 [IU] via SUBCUTANEOUS
  Administered 2020-09-13 (×3): 2 [IU] via SUBCUTANEOUS

## 2020-09-09 MED ORDER — MILRINONE LACTATE IN DEXTROSE 20-5 MG/100ML-% IV SOLN
0.1250 ug/kg/min | INTRAVENOUS | Status: DC
  Administered 2020-09-09 – 2020-09-12 (×4): 0.125 ug/kg/min via INTRAVENOUS
  Filled 2020-09-09 (×5): qty 100

## 2020-09-09 MED ORDER — INSULIN GLARGINE 100 UNIT/ML ~~LOC~~ SOLN
15.0000 [IU] | Freq: Every day | SUBCUTANEOUS | Status: DC
  Administered 2020-09-09 – 2020-09-10 (×2): 15 [IU] via SUBCUTANEOUS
  Filled 2020-09-09 (×3): qty 0.15

## 2020-09-09 NOTE — Progress Notes (Signed)
NAME:  Gary [name], MRN:  [id number], DOB:  1951/05/12, LOS: 5 ADMISSION DATE:  09/03/2020, CONSULTATION DATE:  09/07/2020 REFERRING MD:  Nori Riis, CHIEF COMPLAINT:  GI Bleed   History of Present Illness:  70 y/o M who presented to [facility] on 4/18 with complaints of shortness of breath.    The patient lives at home with his wife.  He is independent of all ADL's.  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  He is a former smoker - smoked from 1968-2005 off / on, heaviest 1.5 ppd.  He grew up in [place], [place] with coal burning stove.  He has multiple hobbies to include Civil War reenactment with black powder rifles, Actuary, & woodworking.  He denies known history of connective tissue disease within family.  His daughter had Turner's syndrome (deceased at age 59 in 60).  The patient reports he had a sleep study in 2016 which did not warrant intervention.  He also reports a significant nosebleed in 2015 that required balloon packing and additional procedure that sounded like embolization by description for bleeding (per ENT in [place]).  No hx of obstructive sleep apnea (had a negative sleep study in the past), no hx of blood clots.   He reports 3 months prior to presentation he had no shortness of breath with activity.  Patient noted in February 2022 that he began having shortness of breath with exertion and activities that normally were not difficult for him (walking uphill to go to church, taking the dogs out).  He also noted feeling full early in meals.  His wife reports that in the last 2 to 3 weeks he has had significant shortness of breath, decreased p.o. intake.  She noted that on 4/16 and 4/17 he was so short of breath to the point that he did not talk much over the weekend prompting evaluation on 4/18.  He reported difficulty walking into the emergency room.  His wife reported that he had poor  coloring with a grayish appearance.    The patient was admitted per teaching service for further evaluation.  Initially required 4 L of oxygen to maintain oxygen saturations which was quickly escalated to 15 L salter.  Cardiology was consulted with new finding of atrial fibrillation.  Echo evaluation demonstrated severe RV enlargement with severely depressed RV systolic function and moderately elevated PASP, LVEF 55-60%, difficult to assess wall motion abnormalities but appeared grossly normal, mild concentric left ventricular hypertrophy, RV systolic function severely reduced, RV severely enlarged, moderately elevated pulmonary artery pressure with estimated RV systolic pressure of 52.0, RA moderately dilated.  Given echo findings, a CTA of the chest was evaluated and negative for pulmonary embolism but demonstrated enlarged central pulmonary arteries consistent with pulmonary arterial hypertension, enlargement of the right ventricle and right atrium, small bilateral pleural effusions greater on the right and patchy groundglass opacities bilaterally, unresolved right middle lobe opacity.  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  He is currently on 8 L per HFNC with sats of 95% PCCM consulted for evaluation of hypoxemic respiratory failure and catheterization  findings.   Pertinent  Medical History  Former smoker HTN DM Traumatic crush injury from a tree branch with multiple fractures Epistaxis requiring packing   Significant Hospital Events: Including procedures, antibiotic start and stop dates in addition to other pertinent events    4/18 admitted with SOB, echo with  elevated right heart pressures, new finding of A. fib  4/19 R/L heart cath with concern for pulmonary hypertension  4/20 on 15L HFNC  4/21 hypotensive episode, responded to IVF, down to 5L O2  4/22 ABLA, to ICU, intubated, severe hypoxemia; EGD with nonbleeding duodenal ulcer  4/23 failed veletri wean   Interim History / Subjective:  Remains intubated and sedated. Coox 70 On veletri 20  Objective   Blood pressure (!) 109/59, pulse 81, temperature 97.88 F (36.6 C), resp. rate (!) 28, height 5' 7"  (1.702 m), weight 86.6 kg, SpO2 97 %. CVP:  [6 mmHg-12 mmHg] 6 mmHg  Vent Mode: PRVC FiO2 (%):  [40 %-80 %] 40 % Set Rate:  [35 bmp] 35 bmp Vt Set:  [520 mL] 520 mL PEEP:  [10 cmH20] 10 cmH20 Plateau Pressure:  [23 cmH20-24 cmH20] 24 cmH20   Intake/Output Summary (Last 24 hours) at 09/09/2020 1043 Last data filed at 09/09/2020 1000 Gross per 24 hour  Intake 1963.76 ml  Output 3650 ml  Net -1686.24 ml   Filed Weights   09/05/20 0308 09/06/20 0530 09/07/20 0100  Weight: 86.9 kg 86.5 kg 86.6 kg    Examination: Constitutional: intubated, sedated  Eyes: pupils equal, not tracking Ears, nose, mouth, and throat: ETT in place, no secretions, trachea midline Cardiovascular: Regular, now sinus on monitor, ext warm Respiratory: rhonci worse on R, triggering vent Gastrointestinal: soft, +BS Skin: No rashes, normal turgor Neurologic: withdraws x 4 Psychiatric: RASS -4   Labs/imaging that I havepersonally reviewed  (right click and "Reselect all SmartList Selections" daily)   coox 70 Pct 1.3, improving WBC improved H/H stable CBG up Cr improved  Resolved Hospital Problem list     Assessment & Plan:  # 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 # Afib not presently on [facility] due to GIB, now in sinus # ABLA thought secondary to duodenal ulcer # Acute hypoxemic respiratory failure secondary to RV failure and possible R pneumonia- ceftriaxone started 4/23 # Abnormal CT  question of RML mass needs OP f/u; less impressed by this # AKI improved with diuresis  - Pushing diuresis with inotropes, lasix today; wean veletri to 10 ng/kg/min, discussed with RT/CHF team; continue midodrine begun 4/23 - Continue amiodarone, rechallenge with [facility] tomorrow if h/h stable -  f/u duodenal biopsy, continue PPI - Ceftriaxone x 5 days - Start lantus, TF - Continue vent support until we can get better control of RV failure - Needs OP workup for secondary pHTN and abnormal CT - Called wife, went to [employer] practice (right click and "Reselect all SmartList Selections" daily)  Diet: start TF Pain/Anxiety/Delirium protocol (if indicated): No VAP protocol (if indicated): Not indicated DVT prophylaxis: PAS hose GI prophylaxis: PPI Glucose control:  lantus/SSI Central venous access:  No Arterial line: yep Foley:  yep Mobility:  bed rest   PT consulted:Yes Last date of multidisciplinary goals of care discussion: pending Code Status:  full code Disposition: ICU  38 min cc time Erskine Emery MD PCCM

## 2020-09-09 NOTE — Progress Notes (Signed)
Inhaled epoprostenol (VELETRI) Monitoring:  Plan to continue inhaled epoprostenol at 10 ng/kg/min  Current dose:  10 ng/kg/min and 1.32 mL/hr IBW: 66.1 kg Duration of each syringe (at current rate): 37.8 hr Last syringe hung (date/time): 09/09/20 at 2215 Next syringe due (date/time): 09/11/20 at ~1200 Backup on unit: Yes  Current plan: Continue at current dose of 10 ng/kg/min - f/u plans in AM for dose adjustments.   Antonietta Jewel, PharmD, [place] Clinical Pharmacist  Phone: [phone] 09/09/2020 10:27 PM  Please check AMION for all [facility] phone numbers After 10:00 PM, call [place] [phone]

## 2020-09-09 NOTE — Progress Notes (Signed)
eLink Physician-Brief Progress Note Patient Name: Gary [name] DOB: 10-31-1950 MRN: [id number]   Date of Service  09/09/2020  HPI/Events of Note  Patient has CBG monitoring with sliding scale Insulin coverage scheduled AC but he is now on enteral nutrition and latest blood sugar is 309 mg / dl.   eICU Interventions  CBG and SSI coverage switched to Q 4 hourly.        Frederik Pear 09/09/2020, 8:37 PM

## 2020-09-09 NOTE — Progress Notes (Signed)
Progress Note  Patient Name: Gary [name] Date of Encounter: 09/09/2020  [facility] Cardiologist: Elouise Munroe, MD   Subjective    NAEO. Did not tolerate dobutamine yesterday with very rapid ventricular rates. Now in sinus rhythm with PACs.  Inpatient Medications    Scheduled Meds: . amiodarone  150 mg Intravenous Once  . atorvastatin  40 mg Per Tube Daily  . chlorhexidine gluconate (MEDLINE KIT)  15 mL Mouth Rinse BID  . Chlorhexidine Gluconate Cloth  6 each Topical Daily  . furosemide  80 mg Intravenous BID  . insulin aspart  0-9 Units Subcutaneous TID WC  . mouth rinse  15 mL Mouth Rinse 10 times per day  . midodrine  10 mg Per Tube TID  . [START ON 09/10/2020] pantoprazole  40 mg Intravenous Q12H   Continuous Infusions: . sodium chloride    . sodium chloride 10 mL/hr at 09/09/20 0600  . amiodarone 60 mg/hr (09/09/20 0823)   Followed by  . amiodarone    . cefTRIAXone (ROCEPHIN)  IV Stopped (09/08/20 1414)  . epoprostenol (VELETRI) for inhalation 1.35m/50mL (30,000 ng/mL) 20 ng/kg/min (09/08/20 2241)  . fentaNYL infusion INTRAVENOUS 75 mcg/hr (09/09/20 0600)  . milrinone    . norepinephrine (LEVOPHED) Adult infusion 15 mcg/min (09/09/20 0600)  . pantoprozole (PROTONIX) infusion 8 mg/hr (09/09/20 0600)  . propofol (DIPRIVAN) infusion 15 mcg/kg/min (09/09/20 0600)  . vasopressin 0.03 Units/min (09/09/20 0604)   PRN Meds: sodium chloride, acetaminophen, fentaNYL, ondansetron (ZOFRAN) IV   Vital Signs    Vitals:   09/09/20 0515 09/09/20 0600 09/09/20 0606 09/09/20 0822  BP:  119/70    Pulse:  74    Resp:  (!) 35    Temp:  97.88 F (36.6 C)    TempSrc:      SpO2: 100% 100% 100% 100%  Weight:      Height:        Intake/Output Summary (Last 24 hours) at 09/09/2020 0835 Last data filed at 09/09/2020 0600 Gross per 24 hour  Intake 1880.72 ml  Output 2700 ml  Net -819.28 ml   Last 3 Weights 09/07/2020 09/06/2020 09/05/2020  Weight (lbs) 190 lb 14.4 oz  190 lb 11.2 oz 191 lb 8 oz  Weight (kg) 86.592 kg 86.501 kg 86.864 kg      Telemetry    Sinus with PACs - Personally Reviewed  ECG    No new - Personally Reviewed  Physical Exam   GEN: No acute distress.   Neck: JVD not visible due to positioning Cardiac: tachycardic, irregularly irregular Respiratory: intubated. GI: Soft, non-distended  MS: mild 1+ bilateral LE pitting edema Neuro:  sedated Psych: Normal affect   Labs    High Sensitivity Troponin:   Recent Labs  Lab 09/03/20 1040 09/03/20 1433 09/03/20 1803  TROPONINIHS 98* 101* 98*      Chemistry Recent Labs  Lab 09/07/20 0537 09/07/20 1459 09/08/20 0240 09/08/20 0441 09/08/20 1713 09/09/20 0400  NA 131*   < > 137 137 136 135  K 4.8   < > 4.0 4.1 5.4* 4.8  CL 102  --  102  --   --  100  CO2 23  --  26  --   --  23  GLUCOSE 199*  --  71  --   --  214*  BUN 64*  --  57*  --   --  52*  CREATININE 1.14  --  1.52*  --   --  1.33*  CALCIUM  7.5*  --  7.5*  --   --  7.5*  GFRNONAA >60  --  49*  --   --  57*  ANIONGAP 6  --  9  --   --  12   < > = values in this interval not displayed.     Hematology Recent Labs  Lab 09/08/20 0240 09/08/20 0441 09/08/20 1630 09/08/20 1713 09/09/20 0400  WBC 15.9*  --  15.4*  --  11.7*  RBC 2.77*  --  2.90*  --  2.70*  HGB 8.6*   < > 8.8* 8.8* 8.2*  HCT 25.8*   < > 27.9* 26.0* 26.0*  MCV 93.1  --  96.2  --  96.3  MCH 31.0  --  30.3  --  30.4  MCHC 33.3  --  31.5  --  31.5  RDW 15.0  --  15.2  --  15.3  PLT 289  --  282  --  262   < > = values in this interval not displayed.    BNP Recent Labs  Lab 09/03/20 1433  BNP 1,202.7*     DDimer No results for input(s): DDIMER in the last 168 hours.   Radiology    DG Chest 1 View  Result Date: 09/07/2020 CLINICAL DATA:  ARDS.  Intubation. EXAM: CHEST  1 VIEW COMPARISON:  One-view chest x-ray 09/07/2020 at 11:26 a.m. FINDINGS: The heart is enlarged. Patient is now intubated. The endotracheal tube terminates 2.5 cm  above the carina. A left IJ line is in place. The tip is above the cavoatrial junction. No pneumothorax is present. Atherosclerotic calcifications are present at the aortic arch. Bibasilar airspace opacities have increased. Airspace opacity in the right upper lung is improved. IMPRESSION: 1. Interval intubation. The endotracheal tube terminates 2.5 cm above the carina. 2. Satisfactory placement of left IJ line. 3. Improving right upper lobe airspace disease. 4. Increasing bibasilar airspace opacities. Findings of shifting opacities consistent with ARDS and possible edema. Electronically Signed   By: Christopher  Mattern M.D.   On: 09/07/2020 15:54   DG CHEST PORT 1 VIEW  Result Date: 09/07/2020 CLINICAL DATA:  Central line placement EXAM: PORTABLE CHEST 1 VIEW COMPARISON:  09/07/2020 FINDINGS: Endotracheal tube is been placed and is in good position Left jugular central venous catheter tip in the lower SVC. No pneumothorax Extensive bilateral airspace disease right greater than left unchanged. IMPRESSION: Endotracheal tube and central line in good position. No pneumothorax Bilateral airspace disease right greater than left unchanged from earlier today. Electronically Signed   By: Charles  Clark M.D.   On: 09/07/2020 14:48   DG CHEST PORT 1 VIEW  Result Date: 09/07/2020 CLINICAL DATA:  Hypoxia EXAM: PORTABLE CHEST 1 VIEW COMPARISON:  09/06/2020 FINDINGS: Cardiac enlargement. Asymmetric airspace disease on the right involving the upper lobe and lower lobe with mild progression. Mild left lower lobe airspace disease. No effusion. Atherosclerotic calcification aortic arch IMPRESSION: Asymmetric airspace disease on the right with mild progression. Possible pneumonia or asymmetric edema. Cardiac enlargement. Electronically Signed   By: Charles  Clark M.D.   On: 09/07/2020 13:22       Assessment & Plan    70yo critically ill man a/w acute UGIB/anemia now s/p EGD and PRBC transfusion. Hx of PAH and cor  pulmonale being followed by the HF team.  #Acute UGIB Monitor H/H  #Cor Pulmonale AHF following.  Did not tolerate dobutamine for RV support due to rapid AF.   #pAF - now in sinus -   suspect the AF is related to acute illness and cor pulmonale - not on AC due to GI bleeding and anemia - agree with starting amiodarone to maintain sinus rhythm   For questions or updates, please contact [facility] Please consult [email or web address] for contact info under        Signed, Vickie Epley, MD  09/09/2020, 8:35 AM

## 2020-09-09 NOTE — Progress Notes (Signed)
Patient ID: Gary [name], male   DOB: 31-Oct-1950, 70 y.o.   MRN: [id number]     Advanced Heart Failure Rounding Note  PCP-Cardiologist: Elouise Munroe, MD   Subjective:     Developed melena. Eliquis stopped. CT with no RP bleed.  EGD 4/22 showed 2 ulcers in duodenum that were not actively bleeding.  He remains on Protonix gtt, no overt bleeding today.  Hgb 8.2 today, no overt bleeding.    Intubated 4/23 with volume load and EGD.  CXR with bibasilar airspace disease.  FiO2 down to 0.5 this morning.  CVP lower at 11.  He went into NSR overnight, now HR 70s.    He remains on NE 15, vasopressin 0.03.  Became markedly tachycardic with dobutamine yesterday so stopped.  Now on Veletri at 20 (slow weaning, did not tolerate fast wean).   Afebrile, WBCs 11.7. On ceftriaxone.   Objective:   Weight Range: 86.6 kg Body mass index is 29.9 kg/m.   Vital Signs:   Temp:  [97.88 F (36.6 C)-99.32 F (37.4 C)] 97.88 F (36.6 C) (04/24 0600) Pulse Rate:  [73-125] 74 (04/24 0600) Resp:  [28-35] 35 (04/24 0600) BP: (90-129)/(58-75) 119/70 (04/24 0600) SpO2:  [93 %-100 %] 100 % (04/24 0606) Arterial Line BP: (85-137)/(52-67) 126/61 (04/24 0600) FiO2 (%):  [50 %-80 %] 50 % (04/24 0606) Last BM Date: 09/07/20  Weight change: Filed Weights   09/05/20 0308 09/06/20 0530 09/07/20 0100  Weight: 86.9 kg 86.5 kg 86.6 kg    Intake/Output:   Intake/Output Summary (Last 24 hours) at 09/09/2020 0811 Last data filed at 09/09/2020 0600 Gross per 24 hour  Intake 1880.72 ml  Output 2700 ml  Net -819.28 ml      Physical Exam   General: Intubated Neck: JVP 10 cm, no thyromegaly or thyroid nodule.  Lungs: Decreased at bases.  CV: Nondisplaced PMI.  Heart regular S1/S2, no S3/S4, no murmur.  No peripheral edema.   Abdomen: Soft, nontender, no hepatosplenomegaly, no distention.  Skin: Intact without lesions or rashes.  Neurologic: Sedated on vent, will awaken.  Extremities: No clubbing or  cyanosis.  HEENT: Normal.    Telemetry   NSR 70s. Personally reviewed   Labs    CBC Recent Labs    09/08/20 1630 09/08/20 1713 09/09/20 0400  WBC 15.4*  --  11.7*  HGB 8.8* 8.8* 8.2*  HCT 27.9* 26.0* 26.0*  MCV 96.2  --  96.3  PLT 282  --  092   Basic Metabolic Panel Recent Labs    09/07/20 0537 09/07/20 1459 09/08/20 0240 09/08/20 0441 09/08/20 1713 09/09/20 0400  NA 131*   < > 137   < > 136 135  K 4.8   < > 4.0   < > 5.4* 4.8  CL 102  --  102  --   --  100  CO2 23  --  26  --   --  23  GLUCOSE 199*  --  71  --   --  214*  BUN 64*  --  57*  --   --  52*  CREATININE 1.14  --  1.52*  --   --  1.33*  CALCIUM 7.5*  --  7.5*  --   --  7.5*  MG 1.7  --   --   --   --   --    < > = values in this interval not displayed.   Liver Function Tests No results for input(s): AST, ALT,  ALKPHOS, BILITOT, PROT, ALBUMIN in the last 72 hours. No results for input(s): LIPASE, AMYLASE in the last 72 hours. Cardiac Enzymes No results for input(s): CKTOTAL, CKMB, CKMBINDEX, TROPONINI in the last 72 hours.  BNP: BNP (last 3 results) Recent Labs    09/03/20 1433  BNP 1,202.7*    ProBNP (last 3 results) No results for input(s): PROBNP in the last 8760 hours.   D-Dimer No results for input(s): DDIMER in the last 72 hours. Hemoglobin A1C No results for input(s): HGBA1C in the last 72 hours. Fasting Lipid Panel Recent Labs    09/08/20 0240  TRIG 73   Thyroid Function Tests No results for input(s): TSH, T4TOTAL, T3FREE, THYROIDAB in the last 72 hours.  Invalid input(s): FREET3  Other results:   Imaging    No results found.   Medications:     Scheduled Medications: . sodium chloride   Intravenous Once  . amiodarone  150 mg Intravenous Once  . atorvastatin  40 mg Per Tube Daily  . chlorhexidine gluconate (MEDLINE KIT)  15 mL Mouth Rinse BID  . Chlorhexidine Gluconate Cloth  6 each Topical Daily  . furosemide  80 mg Intravenous BID  . insulin aspart  0-9  Units Subcutaneous TID WC  . mouth rinse  15 mL Mouth Rinse 10 times per day  . midodrine  10 mg Per Tube TID  . [START ON 09/10/2020] pantoprazole  40 mg Intravenous Q12H    Infusions: . sodium chloride    . sodium chloride 10 mL/hr at 09/09/20 0600  . amiodarone     Followed by  . amiodarone    . cefTRIAXone (ROCEPHIN)  IV Stopped (09/08/20 1414)  . epoprostenol (VELETRI) for inhalation 1.19m/50mL (30,000 ng/mL) 20 ng/kg/min (09/08/20 2241)  . fentaNYL infusion INTRAVENOUS 75 mcg/hr (09/09/20 0600)  . milrinone    . norepinephrine (LEVOPHED) Adult infusion 15 mcg/min (09/09/20 0600)  . pantoprozole (PROTONIX) infusion 8 mg/hr (09/09/20 0600)  . propofol (DIPRIVAN) infusion 15 mcg/kg/min (09/09/20 0600)  . vasopressin 0.03 Units/min (09/09/20 0604)    PRN Medications: sodium chloride, acetaminophen, fentaNYL, ondansetron (ZOFRAN) IV    Assessment/Plan    1. Acute upper GI bleed with symptomatic anemia - hgb down to 6.7 + melena on 4/22, had 2 units PRBCs.  Hgb 8.2 today.  - Eliquis off - CT negative for RP bleed - On Protonix gtt for 1 more day. - EGD with 2 ulcers not acutely bleeding.  - No overt bleeding overnight.   2. PAH with cor pulmonale - CT chest 4/22: No PE or ILD - Echo LVEF 60% severe RV dilation and HK - Cath with moderate PAH. PA = 66/31 (46) PCW = 13 Fick = 4.1/2.1 PVR = 7.8 WU - Suspect this is WHO Group 3 PAH due to hypoxic lung disease/OHS/OSA - Will order PFTs (when better compensated) and will need outpatient sleep study - Check serologies for completeness sake>> in process  - Will need home O2.  - Now with volume overload and pulmonary edema on CXR post-resuscitation for GI bleeding. He diuresed yesterday, creatinine lower at 1.33.  CVP down to 11.  Will give Lasix 80 mg IV bid x 2 doses again today then reassess.   - On Veletri, weaned to 213 Rapid wean yesterday led to decompensation.  Will add milrinone 0.125 mcg/kg/min today to aide wean (now  that he is in NSR - will try to maintain with amiodarone).  Slow wean off Veletri after milrinone begun.  - If  we cannot wean off Veletri today, will need to place [place].   3. Paroxysmal AF - new onset. Unclear duration  - Eliquis on hold d/t GI bleed - He converted on his own to NSR overnight, start amiodarone gtt to maintain.    4. CAD - No chest pain.   -mostly non-obstructive - atorva 40 added  - No ASA with AC  4. R lung mass - will need CT f/u   5. Shock - Suspect mixed hemorrhagic/cardiogenic (RV failure).  May have septic/distributive component with fever.  Improving currently and NE weaning down to 15.  Also remains on vasopressin 0.03.  Continue to wean NE today.   6. ID - No further fevers.  PCT 1.36 yesterday.   - Pending blood cultures/trach aspirate.   - He is empirically on ceftriaxone for ?PNA.   7. Acute hypoxemic respiratory failure - Intubated pre-EGD on 4/22.   - Suspect pulmonary edema, ?PNA.   CRITICAL CARE Performed by: Loralie Champagne  Total critical care time: 40 minutes  Critical care time was exclusive of separately billable procedures and treating other patients.  Critical care was necessary to treat or prevent imminent or life-threatening deterioration.  Critical care was time spent personally by me (independent of midlevel providers or residents) on the following activities: development of treatment plan with patient and/or surrogate as well as nursing, discussions with consultants, evaluation of patient's response to treatment, examination of patient, obtaining history from patient or surrogate, ordering and performing treatments and interventions, ordering and review of laboratory studies, ordering and review of radiographic studies, pulse oximetry and re-evaluation of patient's condition.    Length of Stay: [place], MD  09/09/2020, 8:11 AM  Advanced Heart Failure Team Pager [phone] (M-F; 7a - 5p)  Please contact [facility]  for night-coverage after hours (5p -7a ) and weekends on [email or web address]

## 2020-09-10 ENCOUNTER — Encounter (HOSPITAL_COMMUNITY): Payer: Self-pay | Admitting: Gastroenterology

## 2020-09-10 DIAGNOSIS — I2781 Cor pulmonale (chronic): Secondary | ICD-10-CM | POA: Diagnosis not present

## 2020-09-10 DIAGNOSIS — J9601 Acute respiratory failure with hypoxia: Secondary | ICD-10-CM | POA: Diagnosis not present

## 2020-09-10 DIAGNOSIS — I482 Chronic atrial fibrillation, unspecified: Secondary | ICD-10-CM | POA: Diagnosis not present

## 2020-09-10 LAB — POCT I-STAT 7, (LYTES, BLD GAS, ICA,H+H)
Acid-Base Excess: 8 mmol/L — ABNORMAL HIGH (ref 0.0–2.0)
Bicarbonate: 32.4 mmol/L — ABNORMAL HIGH (ref 20.0–28.0)
Calcium, Ion: 1.14 mmol/L — ABNORMAL LOW (ref 1.15–1.40)
HCT: 21 % — ABNORMAL LOW (ref 39.0–52.0)
Hemoglobin: 7.1 g/dL — ABNORMAL LOW (ref 13.0–17.0)
O2 Saturation: 96 %
Patient temperature: 36.9
Potassium: 3.3 mmol/L — ABNORMAL LOW (ref 3.5–5.1)
Sodium: 138 mmol/L (ref 135–145)
TCO2: 34 mmol/L — ABNORMAL HIGH (ref 22–32)
pCO2 arterial: 42.4 mmHg (ref 32.0–48.0)
pH, Arterial: 7.49 — ABNORMAL HIGH (ref 7.350–7.450)
pO2, Arterial: 76 mmHg — ABNORMAL LOW (ref 83.0–108.0)

## 2020-09-10 LAB — CBC
HCT: 22.5 % — ABNORMAL LOW (ref 39.0–52.0)
Hemoglobin: 7.2 g/dL — ABNORMAL LOW (ref 13.0–17.0)
MCH: 30.3 pg (ref 26.0–34.0)
MCHC: 32 g/dL (ref 30.0–36.0)
MCV: 94.5 fL (ref 80.0–100.0)
Platelets: 242 10*3/uL (ref 150–400)
RBC: 2.38 MIL/uL — ABNORMAL LOW (ref 4.22–5.81)
RDW: 15.4 % (ref 11.5–15.5)
WBC: 10.8 10*3/uL — ABNORMAL HIGH (ref 4.0–10.5)
nRBC: 0.3 % — ABNORMAL HIGH (ref 0.0–0.2)

## 2020-09-10 LAB — BASIC METABOLIC PANEL
Anion gap: 8 (ref 5–15)
BUN: 36 mg/dL — ABNORMAL HIGH (ref 8–23)
CO2: 30 mmol/L (ref 22–32)
Calcium: 7.7 mg/dL — ABNORMAL LOW (ref 8.9–10.3)
Chloride: 100 mmol/L (ref 98–111)
Creatinine, Ser: 0.92 mg/dL (ref 0.61–1.24)
GFR, Estimated: >60 mL/min (ref >60)
Glucose, Bld: 208 mg/dL — ABNORMAL HIGH (ref 70–99)
Potassium: 3.4 mmol/L — ABNORMAL LOW (ref 3.5–5.1)
Sodium: 138 mmol/L (ref 135–145)

## 2020-09-10 LAB — GLUCOSE, CAPILLARY
Glucose-Capillary: 207 mg/dL — ABNORMAL HIGH (ref 70–99)
Glucose-Capillary: 212 mg/dL — ABNORMAL HIGH (ref 70–99)
Glucose-Capillary: 183 mg/dL — ABNORMAL HIGH (ref 70–99)
Glucose-Capillary: 206 mg/dL — ABNORMAL HIGH (ref 70–99)
Glucose-Capillary: 184 mg/dL — ABNORMAL HIGH (ref 70–99)
Glucose-Capillary: 248 mg/dL — ABNORMAL HIGH (ref 70–99)

## 2020-09-10 LAB — COOXEMETRY PANEL
Carboxyhemoglobin: 1.1 % (ref 0.5–1.5)
Methemoglobin: 0.9 % (ref 0.0–1.5)
O2 Saturation: 64.2 %
Total hemoglobin: 12.2 g/dL (ref 12.0–16.0)

## 2020-09-10 LAB — PROCALCITONIN: Procalcitonin: 0.73 ng/mL

## 2020-09-10 MED ORDER — SODIUM CHLORIDE 0.9% FLUSH
10.0000 mL | Freq: Two times a day (BID) | INTRAVENOUS | Status: DC
  Administered 2020-09-10 (×3): 10 mL
  Administered 2020-09-11: 30 mL
  Administered 2020-09-11 – 2020-09-12 (×2): 10 mL
  Administered 2020-09-12: 30 mL
  Administered 2020-09-13 – 2020-10-02 (×21): 10 mL

## 2020-09-10 MED ORDER — PANTOPRAZOLE SODIUM 40 MG IV SOLR
40.0000 mg | Freq: Two times a day (BID) | INTRAVENOUS | Status: DC
  Administered 2020-09-10 – 2020-09-12 (×6): 40 mg via INTRAVENOUS
  Filled 2020-09-10 (×6): qty 40

## 2020-09-10 MED ORDER — POTASSIUM CHLORIDE 20 MEQ PO PACK
40.0000 meq | PACK | Freq: Once | ORAL | Status: AC
  Administered 2020-09-10: 40 meq
  Filled 2020-09-10: qty 2

## 2020-09-10 MED ORDER — FUROSEMIDE 10 MG/ML IJ SOLN
80.0000 mg | Freq: Once | INTRAMUSCULAR | Status: AC
  Administered 2020-09-10: 80 mg via INTRAVENOUS
  Filled 2020-09-10: qty 8

## 2020-09-10 MED ORDER — SODIUM CHLORIDE 0.9% FLUSH
10.0000 mL | INTRAVENOUS | Status: DC | PRN
Start: 2020-09-10 — End: 2020-09-25

## 2020-09-10 MED ORDER — VITAL AF 1.2 CAL PO LIQD
1000.0000 mL | ORAL | Status: DC
  Administered 2020-09-10 – 2020-09-11 (×2): 1000 mL

## 2020-09-10 NOTE — Progress Notes (Signed)
NAME:  Gary [name], MRN:  [id number], DOB:  Mar 31, 1951, LOS: 6 ADMISSION DATE:  09/03/2020, CONSULTATION DATE:  09/07/2020 REFERRING MD:  Nori Riis, CHIEF COMPLAINT:  GI Bleed   History of Present Illness:  70 y/o M who presented to [facility] on 4/18 with complaints of shortness of breath.    The patient lives at home with his wife.  He is independent of all ADL's.  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  He is a former smoker - smoked from 1968-2005 off / on, heaviest 1.5 ppd.  He grew up in [place], [place] with coal burning stove.  He has multiple hobbies to include Civil War reenactment with black powder rifles, Actuary, & woodworking.  He denies known history of connective tissue disease within family.  His daughter had Turner's syndrome (deceased at age 31 in 41).  The patient reports he had a sleep study in 2016 which did not warrant intervention.  He also reports a significant nosebleed in 2015 that required balloon packing and additional procedure that sounded like embolization by description for bleeding (per ENT in [place]).  No hx of obstructive sleep apnea (had a negative sleep study in the past), no hx of blood clots.   He reports 3 months prior to presentation he had no shortness of breath with activity.  Patient noted in February 2022 that he began having shortness of breath with exertion and activities that normally were not difficult for him (walking uphill to go to church, taking the dogs out).  He also noted feeling full early in meals.  His wife reports that in the last 2 to 3 weeks he has had significant shortness of breath, decreased p.o. intake.  She noted that on 4/16 and 4/17 he was so short of breath to the point that he did not talk much over the weekend prompting evaluation on 4/18.  He reported difficulty walking into the emergency room.  His wife reported that he had poor  coloring with a grayish appearance.    The patient was admitted per teaching service for further evaluation.  Initially required 4 L of oxygen to maintain oxygen saturations which was quickly escalated to 15 L salter.  Cardiology was consulted with new finding of atrial fibrillation.  Echo evaluation demonstrated severe RV enlargement with severely depressed RV systolic function and moderately elevated PASP, LVEF 55-60%, difficult to assess wall motion abnormalities but appeared grossly normal, mild concentric left ventricular hypertrophy, RV systolic function severely reduced, RV severely enlarged, moderately elevated pulmonary artery pressure with estimated RV systolic pressure of 03.8, RA moderately dilated.  Given echo findings, a CTA of the chest was evaluated and negative for pulmonary embolism but demonstrated enlarged central pulmonary arteries consistent with pulmonary arterial hypertension, enlargement of the right ventricle and right atrium, small bilateral pleural effusions greater on the right and patchy groundglass opacities bilaterally, unresolved right middle lobe opacity.  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  He is currently on 8 L per HFNC with sats of 95% PCCM consulted for evaluation of hypoxemic respiratory failure and catheterization  findings.   Pertinent  Medical History  Former smoker HTN DM Traumatic crush injury from a tree branch with multiple fractures Epistaxis requiring packing   Significant Hospital Events: Including procedures, antibiotic start and stop dates in addition to other pertinent events    4/18 admitted with SOB, echo with  elevated right heart pressures, new finding of A. fib  4/19 R/L heart cath with concern for pulmonary hypertension  4/20 on 15L HFNC  4/21 hypotensive episode, responded to IVF, down to 5L O2  4/22 ABLA, to ICU, intubated, severe hypoxemia; EGD with nonbleeding duodenal ulcer  4/23 failed veletri wean   Interim History / Subjective:  Remains intubated and sedated. coox 64% On veletri 10ng/kg/min   Objective   Blood pressure 98/67, pulse 86, temperature 98.42 F (36.9 C), resp. rate (!) 35, height 5' 7"  (1.702 m), weight 84.4 kg, SpO2 100 %. CVP:  [3 mmHg-12 mmHg] 9 mmHg  Vent Mode: PRVC FiO2 (%):  [40 %] 40 % Set Rate:  [35 bmp] 35 bmp Vt Set:  [520 mL] 520 mL PEEP:  [8 cmH20-10 cmH20] 8 cmH20 Plateau Pressure:  [21 cmH20-22 cmH20] 21 cmH20   Intake/Output Summary (Last 24 hours) at 09/10/2020 0912 Last data filed at 09/10/2020 0800 Gross per 24 hour  Intake 2633.69 ml  Output 4715 ml  Net -2081.31 ml   Filed Weights   09/06/20 0530 09/07/20 0100 09/10/20 0349  Weight: 86.5 kg 86.6 kg 84.4 kg    Examination: Constitutional: intubated, sedated  Eyes: pupils equal, constricted Ears, nose, mouth, and throat: ETT in place, no secretions, trachea midline Cardiovascular: In atrial fibrillation on monitor, regular rate, warm extremities. No lower or upper extremity edema. Respiratory: air movement throughout bilaterally Gastrointestinal: Soft. 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 Hemoglobin dropped further today - hold on [facility] -  f/u duodenal biopsy, pantoprazole changed to Q12HR - Ceftriaxone x 5 days (started 4/23) - Replete K 72mq through tube. Repeat BMP tomorrow. - Start lantus, TF - Continue vent support until we can get better control of RV failure - Needs OP workup for secondary pHTN and abnormal CT  Best practice (right click and "Reselect all SmartList Selections" daily)  Diet: start TF Pain/Anxiety/Delirium protocol (if indicated): No VAP protocol (if indicated): Not indicated DVT prophylaxis: PAS hose GI prophylaxis: PPI Glucose control:  lantus/SSI Central venous access:  No Arterial line: yep Foley:  yep Mobility:  bed rest   PT consulted:Yes Last date of multidisciplinary goals of care discussion: pending Code Status:  full code Disposition: ICU  KLetitia Neri MS4  PCCM Attending:  70 yo M, admitted to ICU for UGIB, ABLA, decompensated PAH, cor pulmonale, cad, mixed shock state, ahrf on mv.   Patient see on rounds and examined today in the ICU. Discussed case with cardiology and icu nursing.   BP (!) 116/53   Pulse 75   Temp 99.1 F (37.3 C) (Bladder)   Resp (!) 35   Ht 5' 7"  (1.702 m)   Wt 84.4 kg   SpO2 95%   BMI 29.14 kg/m   Gen: elderly male, intubated on life support.  HENT: ETT in place  Heart: RRR, s1 s2 Lungs: BL vented breaths  Abd: soft nt nd   Labs reviewed   A:  UGIB from ulcer Mixed shock, hemorrhagic shock  Decompensated RV failure  PAH, cor pulmonale    P: Remains on inotropes  Continue veletri for pulmonary vasodilation  Remains on empiric abx  Wean from pressors, goal MAP >45mHg  Protonix IV BID  Holding eliquis PAD sedation  Adult mech vent protocol, wean as tolerated  This patient is critically ill with multiple organ system failure; which, requires frequent high complexity decision making, assessment, support, evaluation, and titration of therapies. This was completed through the application of advanced monitoring technologies and extensive interpretation of multiple databases. During this encounter critical care time was devoted to patient care services described in this note for 34 minutes.  BGarner Nash DO [facility] 09/10/2020 8:42 PM

## 2020-09-10 NOTE — Progress Notes (Signed)
PT Cancellation Note/ Discharge  Patient Details Name: Gary [name] MRN: [id number] DOB: 07/19/1950   Cancelled Treatment:    Reason Eval/Treat Not Completed: Patient not medically ready (Pt intubated 4/22, currently sedated and not appropriate for therapy discussed with HF team. will sign off and await new order when medically appropriate)   Nafis Farnan B Ellena Kamen 09/10/2020, 8:47 AM  [place] Pager: [phone] Office: [phone]

## 2020-09-10 NOTE — Progress Notes (Addendum)
Patient ID: Gary [name], male   DOB: [birth year], 70 y.o.   MRN: [id number]     Advanced Heart Failure Rounding Note  PCP-Cardiologist: Elouise Munroe, MD   Subjective:   Events  4/22  EGD 4/22 showed 2 ulcers in duodenum that were not actively bleeding.  He remains on Protonix gtt, no overt bleeding today.  Hgb 10.8    4/23 Intubated. Dobutamine stopped due to marked tachycardia. Started antibiotics for pneumonia. Blood CX- NGTD. Sputum with strep.  4/24 Did not tolerate fast veletri wean.   Remains on NE 8, vasopressin 0.03, milrinone 0.125 mcg, amio 30 mg. CO-OX 64%.    Now on Veletri at 10  (slow weaning, did not tolerate fast wean).   Sedation increased this morning.   Objective:   Weight Range: 84.4 kg Body mass index is 29.14 kg/m.   Vital Signs:   Temp:  [97.88 F (36.6 C)-98.42 F (36.9 C)] 98.2 F (36.8 C) (04/25 0738) Pulse Rate:  [61-103] 95 (04/25 0745) Resp:  [12-35] 35 (04/25 0745) BP: (76-129)/(52-76) 107/62 (04/25 0700) SpO2:  [92 %-99 %] 98 % (04/25 0800) Arterial Line BP: (64-144)/(46-70) 117/55 (04/25 0700) FiO2 (%):  [40 %] 40 % (04/25 0800) Weight:  [84.4 kg] 84.4 kg (04/25 0349) Last BM Date: 09/07/20  Weight change: Filed Weights   09/06/20 0530 09/07/20 0100 09/10/20 0349  Weight: 86.5 kg 86.6 kg 84.4 kg    Intake/Output:   Intake/Output Summary (Last 24 hours) at 09/10/2020 0835 Last data filed at 09/10/2020 0800 Gross per 24 hour  Intake 2856.3 ml  Output 5315 ml  Net -2458.7 ml      Physical Exam   General: Intubated/sedated HEENT: ETT  Neck: supple. no JVD. Carotids 2+ bilat; no bruits. No lymphadenopathy or thryomegaly appreciated. Cor: PMI nondisplaced. Regular rate & rhythm. No rubs, gallops or murmurs. Lungs: clear Abdomen: soft, nontender, nondistended. No hepatosplenomegaly. No bruits or masses. Good bowel sounds. Extremities: no cyanosis, clubbing, rash, edema Neuro: Intubated/Sedated.   Telemetry  SR 70s    Labs     CBC Recent Labs    09/09/20 0400 09/10/20 0444 09/10/20 0518  WBC 11.7* 10.8*  --   HGB 8.2* 7.2* 7.1*  HCT 26.0* 22.5* 21.0*  MCV 96.3 94.5  --   PLT 262 242  --    Basic Metabolic Panel Recent Labs    09/09/20 0400 09/10/20 0444 09/10/20 0518  NA 135 138 138  K 4.8 3.4* 3.3*  CL 100 100  --   CO2 23 30  --   GLUCOSE 214* 208*  --   BUN 52* 36*  --   CREATININE 1.33* 0.92  --   CALCIUM 7.5* 7.7*  --    Liver Function Tests No results for input(s): AST, ALT, ALKPHOS, BILITOT, PROT, ALBUMIN in the last 72 hours. No results for input(s): LIPASE, AMYLASE in the last 72 hours. Cardiac Enzymes No results for input(s): CKTOTAL, CKMB, CKMBINDEX, TROPONINI in the last 72 hours.  BNP: BNP (last 3 results) Recent Labs    09/03/20 1433  BNP 1,202.7*    ProBNP (last 3 results) No results for input(s): PROBNP in the last 8760 hours.   D-Dimer No results for input(s): DDIMER in the last 72 hours. Hemoglobin A1C No results for input(s): HGBA1C in the last 72 hours. Fasting Lipid Panel Recent Labs    09/08/20 0240  TRIG 73   Thyroid Function Tests No results for input(s): TSH, T4TOTAL, T3FREE, THYROIDAB in the last  72 hours.  Invalid input(s): FREET3  Other results:   Imaging    No results found.   Medications:     Scheduled Medications: . sodium chloride   Intravenous Once  . atorvastatin  40 mg Per Tube Daily  . chlorhexidine gluconate (MEDLINE KIT)  15 mL Mouth Rinse BID  . Chlorhexidine Gluconate Cloth  6 each Topical Daily  . feeding supplement (PROSource TF)  45 mL Per Tube BID  . feeding supplement (VITAL HIGH PROTEIN)  1,000 mL Per Tube Q24H  . insulin aspart  0-24 Units Subcutaneous Q4H  . insulin glargine  15 Units Subcutaneous Daily  . mouth rinse  15 mL Mouth Rinse 10 times per day  . midodrine  10 mg Per Tube TID  . pantoprazole  40 mg Intravenous Q12H  . sodium chloride flush  10-40 mL Intracatheter Q12H    Infusions: . sodium  chloride Stopped (09/10/20 0455)  . sodium chloride 10 mL/hr at 09/10/20 0800  . amiodarone 30 mg/hr (09/10/20 0800)  . cefTRIAXone (ROCEPHIN)  IV Stopped (09/09/20 1342)  . epoprostenol (VELETRI) for inhalation 1.20m/50mL (30,000 ng/mL) 10 ng/kg/min (09/09/20 2215)  . fentaNYL infusion INTRAVENOUS 75 mcg/hr (09/10/20 0800)  . milrinone 0.125 mcg/kg/min (09/10/20 0800)  . norepinephrine (LEVOPHED) Adult infusion 8 mcg/min (09/10/20 0800)  . pantoprozole (PROTONIX) infusion 8 mg/hr (09/10/20 0800)  . propofol (DIPRIVAN) infusion 15 mcg/kg/min (09/10/20 0800)  . vasopressin 0.03 Units/min (09/10/20 0800)    PRN Medications: sodium chloride, acetaminophen, fentaNYL, ondansetron (ZOFRAN) IV, sodium chloride flush    Assessment/Plan    1. Acute upper GI bleed with symptomatic anemia - hgb down to 6.7 + melena on 4/22, had 2 units PRBCs.  Hgb trending down 8.2>7.2.  - Eliquis off - CT negative for RP bleed - On Protonix gtt for 1 more day. - EGD with 2 ulcers not acutely bleeding.  -No bleeding overnight.   2. PAH with cor pulmonale - CT chest 4/22: No PE or ILD - Echo LVEF 60% severe RV dilation and HK - Cath with moderate PAH. PA = 66/31 (46) PCW = 13 Fick = 4.1/2.1 PVR = 7.8 WU - Suspect this is WHO Group 3 PAH due to hypoxic lung disease/OHS/OSA - Will order PFTs (when better compensated) and will need outpatient sleep study - Check serologies for completeness sake>> in process  - Will need home O2.  - CVP 7.   On norepi 8 mcg and vasopressin. CO-OX stable.  - Give 80 mg IV lasix x1. Renal function stable. Supp K.  - On Veletri, weaned to 182. Rapid wean yesterday led to decompensation.  Continue milrinone 0.125 mcg/kg/min today to aide wean (now that he is in NSR - will try to maintain with amiodarone).  Slow wean off Veletri after milrinone begun.  - May need swan.   3. Paroxysmal AF - new onset. Unclear duration  - Eliquis on hold d/t GI bleed - Remains in NSR. Continue  amio SR overnight, start amiodarone gtt to maintain.    4. CAD  -mostly non-obstructive - atorva 40 added  - No ASA with AC  4. R lung mass - will need CT f/u   5. Shock - Suspect mixed hemorrhagic/cardiogenic (RV failure).  May have septic/distributive component with fever.   - Remains Norepi 8 mcg + vasopressin.Contine to wean.   6. ID - Afebrile today.  PCT 0.7    - Bld CX - NGT - Sputum - with few strep. .   -  He is empirically on ceftriaxone for ?PNA.   7. Acute hypoxemic respiratory failure - Intubated pre-EGD on 4/22.   - Suspect pulmonary edema, ?PNA.  - CCM appreciated.   Length of Stay: [place], NP  09/10/2020, 8:35 AM  Advanced Heart Failure Team Pager [phone] (M-F; 7a - 5p)  Please contact [facility] for night-coverage after hours (5p -7a ) and weekends on [email or web address]  Agree with above.   Remains intubated/sedated. On NE, VP and Veletri. Co-ox 64% Strep in sputum. CVP 8-9.  Remains in NSR on IV amio  Hgb 8.2 -> 7.1  General:  Intubated/sedated HEENT: normal + ETT Neck: supple. + TLC. Carotids 2+ bilat; no bruits. No lymphadenopathy or thryomegaly appreciated. Cor: PMI nondisplaced. Regular rate & rhythm. No rubs, gallops or murmurs. Lungs: coarse Abdomen: obese soft, nontender, nondistended. No hepatosplenomegaly. No bruits or masses. Good bowel sounds. Extremities: no cyanosis, clubbing, rash, edema Neuro: sedated on vent   Remains critically ill. Doubt pulmonary HTN is main issue in his respiratory status here. Would continue to wean inotropes and Veletri as tolerated. Agree with IV lasix and abx. Repeat CXR. Transfuse 1 more unit RBCs. No heparin for now.   CRITICAL CARE Performed by: Glori Bickers  Total critical care time: 35 minutes  Critical care time was exclusive of separately billable procedures and treating other patients.  Critical care was necessary to treat or prevent imminent or life-threatening  deterioration.  Critical care was time spent personally by me (independent of midlevel providers or residents) on the following activities: development of treatment plan with patient and/or surrogate as well as nursing, discussions with consultants, evaluation of patient's response to treatment, examination of patient, obtaining history from patient or surrogate, ordering and performing treatments and interventions, ordering and review of laboratory studies, ordering and review of radiographic studies, pulse oximetry and re-evaluation of patient's condition.  Glori Bickers, MD  10:14 AM

## 2020-09-10 NOTE — Progress Notes (Signed)
[facility] ADULT ICU REPLACEMENT PROTOCOL   The patient does apply for the [facility] Adult ICU Electrolyte Replacment Protocol based on the criteria listed below:   1. Is GFR >/= 30 ml/min? Yes.    Patient's GFR today is >60 2. Is SCr </= 2? Yes.   Patient's SCr is 0.92 ml/kg/hr 3. Did SCr increase >/= 0.5 in 24 hours? No. 4. Abnormal electrolyte(s):  K 3.4 5. Ordered repletion with: protocol 6. If a panic level lab has been reported, has the CCM MD in charge been notified? Yes.  .   Physician:  Lowella Bandy R Landers Prajapati 09/10/2020 6:33 AM

## 2020-09-10 NOTE — Progress Notes (Signed)
Nutrition Follow-up  DOCUMENTATION CODES:   Not applicable  INTERVENTION:   Tube Feeding via OG:  Vital AF 1.2 at 65 ml/hr Provide 1872 kcals, 117 g of protein and 1264 mL of free water   TF regimen and propofol at current rate providing 2015 total kcal/day (100 % of kcal needs)     NUTRITION DIAGNOSIS:   Inadequate oral intake related to inability to eat as evidenced by NPO status.  Being addressed via TF   GOAL:   Patient will meet greater than or equal to 90% of their needs  Progressing  MONITOR:   Vent status,Labs,Weight trends,Skin,I & O's  REASON FOR ASSESSMENT:   Ventilator    ASSESSMENT:   70 year old with history of hypertension, dyslipidemia, and type 2 diabetes presenting with acute hypoxemic respiratory failure likely due to new heart failure and atrial fibrillation with rapid ventricular rates.  4/22 EGD: no active bleeding, large duodenal ulcer, erosive gastritis;  Intubated 4/24 TF Protocol initiated  Patient is currently intubated on ventilator support MV: 18.3 L/min Temp (24hrs), Avg:98.3 F (36.8 C), Min:97.88 F (36.6 C), Max:98.6 F (37 C)  Propofol: 5.4 ml/hr  Tolerating Vital High Protein at 40 ml/hr No xray indicating enteric tube placement; recommend abdominal xray to confirm placement  +BM, abdomen soft, umbilical hernia protruding   Weight down to 84.4 kg  Labs: potassium 3.3 (L) Meds: ss novolog, lantus, KCl   Diet Order:   Diet Order            Diet NPO time specified  Diet effective now                 EDUCATION NEEDS:   No education needs have been identified at this time  Skin:  Skin Assessment: Reviewed RN Assessment  Last BM:  4/24  Height:   Ht Readings from Last 1 Encounters:  09/03/20 5' 7"  (1.702 m)    Weight:   Wt Readings from Last 1 Encounters:  09/10/20 84.4 kg    Ideal Body Weight:  67.3 kg  BMI:  Body mass index is 29.14 kg/m.  Estimated Nutritional Needs:   Kcal:   2019  Protein:  110-125 grams  Fluid:  > 2 L   Kerman Passey MS, RDN, LDN, CNSC Registered Dietitian III Clinical Nutrition RD Pager and On-Call Pager Number Located in [place]

## 2020-09-10 NOTE — Progress Notes (Signed)
Inhaled epoprostenol (VELETRI) Monitoring:  Plan to continue inhaled epoprostenol at 10 ng/kg/min  Current dose:  10 ng/kg/min and 1.32 mL/hr IBW: 66.1 kg Duration of each syringe (at current rate): 37.8 hr Last syringe hung (date/time): 09/09/20 at 2215 Next syringe due (date/time): 09/11/20 at ~1200 Backup on unit: Yes  Current plan: Continue at current dose of 10 ng/kg/min - f/u plans in AM for dose adjustments.   Erin Hearing PharmD., BCPS Clinical Pharmacist 09/10/2020 8:57 AM  Please check AMION for all [place] phone numbers After 10:00 PM, call [place] [phone]

## 2020-09-10 NOTE — Progress Notes (Signed)
[facility] Progress Note Patient Name: Gary [name] DOB: 1950/05/27 MRN: [id number]   Date of Service  09/10/2020  HPI/Events of Note  K+ 3.4, creatinine 0.92.  eICU Interventions  E-Link Nurse Driven Electrolyte Replacement Protocol ordered.        Zaida Reiland U Trichelle Lehan 09/10/2020, 6:18 AM

## 2020-09-11 DIAGNOSIS — J9601 Acute respiratory failure with hypoxia: Secondary | ICD-10-CM | POA: Diagnosis not present

## 2020-09-11 LAB — CBC
HCT: 22.7 % — ABNORMAL LOW (ref 39.0–52.0)
Hemoglobin: 7.2 g/dL — ABNORMAL LOW (ref 13.0–17.0)
MCH: 30.4 pg (ref 26.0–34.0)
MCHC: 31.7 g/dL (ref 30.0–36.0)
MCV: 95.8 fL (ref 80.0–100.0)
Platelets: 227 10*3/uL (ref 150–400)
RBC: 2.37 MIL/uL — ABNORMAL LOW (ref 4.22–5.81)
RDW: 15.5 % (ref 11.5–15.5)
WBC: 8.2 10*3/uL (ref 4.0–10.5)
nRBC: 0.6 % — ABNORMAL HIGH (ref 0.0–0.2)

## 2020-09-11 LAB — BASIC METABOLIC PANEL
Anion gap: 8 (ref 5–15)
BUN: 28 mg/dL — ABNORMAL HIGH (ref 8–23)
CO2: 31 mmol/L (ref 22–32)
Calcium: 7.6 mg/dL — ABNORMAL LOW (ref 8.9–10.3)
Chloride: 100 mmol/L (ref 98–111)
Creatinine, Ser: 0.76 mg/dL (ref 0.61–1.24)
GFR, Estimated: >60 mL/min (ref >60)
Glucose, Bld: 201 mg/dL — ABNORMAL HIGH (ref 70–99)
Potassium: 3.6 mmol/L (ref 3.5–5.1)
Sodium: 139 mmol/L (ref 135–145)

## 2020-09-11 LAB — GLUCOSE, CAPILLARY
Glucose-Capillary: 161 mg/dL — ABNORMAL HIGH (ref 70–99)
Glucose-Capillary: 187 mg/dL — ABNORMAL HIGH (ref 70–99)
Glucose-Capillary: 193 mg/dL — ABNORMAL HIGH (ref 70–99)
Glucose-Capillary: 196 mg/dL — ABNORMAL HIGH (ref 70–99)
Glucose-Capillary: 199 mg/dL — ABNORMAL HIGH (ref 70–99)
Glucose-Capillary: 262 mg/dL — ABNORMAL HIGH (ref 70–99)
Glucose-Capillary: 263 mg/dL — ABNORMAL HIGH (ref 70–99)

## 2020-09-11 LAB — POCT I-STAT 7, (LYTES, BLD GAS, ICA,H+H)
Acid-Base Excess: 10 mmol/L — ABNORMAL HIGH (ref 0.0–2.0)
Bicarbonate: 34.2 mmol/L — ABNORMAL HIGH (ref 20.0–28.0)
Calcium, Ion: 1.09 mmol/L — ABNORMAL LOW (ref 1.15–1.40)
HCT: 22 % — ABNORMAL LOW (ref 39.0–52.0)
Hemoglobin: 7.5 g/dL — ABNORMAL LOW (ref 13.0–17.0)
O2 Saturation: 98 %
Patient temperature: 37.9
Potassium: 4.1 mmol/L (ref 3.5–5.1)
Sodium: 139 mmol/L (ref 135–145)
TCO2: 36 mmol/L — ABNORMAL HIGH (ref 22–32)
pCO2 arterial: 48 mmHg (ref 32.0–48.0)
pH, Arterial: 7.464 — ABNORMAL HIGH (ref 7.350–7.450)
pO2, Arterial: 97 mmHg (ref 83.0–108.0)

## 2020-09-11 LAB — COOXEMETRY PANEL
Carboxyhemoglobin: 2.1 % — ABNORMAL HIGH (ref 0.5–1.5)
Methemoglobin: 1.4 % (ref 0.0–1.5)
O2 Saturation: 61.1 %
Total hemoglobin: <4 g/dL — CL (ref 12.0–16.0)

## 2020-09-11 LAB — PREPARE RBC (CROSSMATCH)

## 2020-09-11 LAB — CULTURE, RESPIRATORY W GRAM STAIN

## 2020-09-11 LAB — TRIGLYCERIDES: Triglycerides: 61 mg/dL (ref <150)

## 2020-09-11 LAB — SURGICAL PATHOLOGY

## 2020-09-11 MED ORDER — SILDENAFIL CITRATE 20 MG PO TABS: 20.0000 mg | ORAL_TABLET | Freq: Three times a day (TID) | ORAL | Status: DC

## 2020-09-11 MED ORDER — SILDENAFIL CITRATE 20 MG PO TABS
40.0000 mg | ORAL_TABLET | Freq: Three times a day (TID) | ORAL | Status: DC
  Administered 2020-09-11 – 2020-10-02 (×63): 40 mg via ORAL
  Filled 2020-09-11 (×63): qty 2

## 2020-09-11 MED ORDER — FUROSEMIDE 10 MG/ML IJ SOLN
40.0000 mg | Freq: Once | INTRAMUSCULAR | Status: AC
  Administered 2020-09-11: 40 mg via INTRAVENOUS
  Filled 2020-09-11: qty 4

## 2020-09-11 MED ORDER — BUDESONIDE 0.5 MG/2ML IN SUSP
0.5000 mg | Freq: Two times a day (BID) | RESPIRATORY_TRACT | Status: DC
  Administered 2020-09-11 – 2020-10-02 (×42): 0.5 mg via RESPIRATORY_TRACT
  Filled 2020-09-11 (×42): qty 2

## 2020-09-11 MED ORDER — POTASSIUM CHLORIDE 20 MEQ PO PACK
40.0000 meq | PACK | Freq: Once | ORAL | Status: AC
  Administered 2020-09-11: 40 meq
  Filled 2020-09-11: qty 2

## 2020-09-11 MED ORDER — ORAL CARE MOUTH RINSE
15.0000 mL | Freq: Two times a day (BID) | OROMUCOSAL | Status: DC
  Administered 2020-09-11 – 2020-09-23 (×15): 15 mL via OROMUCOSAL

## 2020-09-11 MED ORDER — INSULIN GLARGINE 100 UNIT/ML ~~LOC~~ SOLN
20.0000 [IU] | Freq: Every day | SUBCUTANEOUS | Status: DC
  Administered 2020-09-11 – 2020-09-17 (×7): 20 [IU] via SUBCUTANEOUS
  Filled 2020-09-11 (×7): qty 0.2

## 2020-09-11 MED ORDER — POTASSIUM CHLORIDE CRYS ER 20 MEQ PO TBCR
40.0000 meq | EXTENDED_RELEASE_TABLET | Freq: Once | ORAL | Status: AC
  Administered 2020-09-11: 40 meq via ORAL
  Filled 2020-09-11: qty 2

## 2020-09-11 MED ORDER — SODIUM CHLORIDE 0.9% IV SOLUTION: Freq: Once | INTRAVENOUS | Status: AC

## 2020-09-11 MED ORDER — DEXAMETHASONE SODIUM PHOSPHATE 4 MG/ML IJ SOLN
4.0000 mg | Freq: Two times a day (BID) | INTRAMUSCULAR | Status: AC
  Administered 2020-09-11 – 2020-09-12 (×3): 4 mg via INTRAVENOUS
  Filled 2020-09-11 (×3): qty 1

## 2020-09-11 MED ORDER — UMECLIDINIUM BROMIDE 62.5 MCG/INH IN AEPB
1.0000 | INHALATION_SPRAY | Freq: Every day | RESPIRATORY_TRACT | Status: DC
  Administered 2020-09-11 – 2020-09-12 (×2): 1 via RESPIRATORY_TRACT
  Filled 2020-09-11: qty 7

## 2020-09-11 NOTE — Progress Notes (Signed)
Veletri turned off and patient placed on PS/CPAP 5/5 by CCM.

## 2020-09-11 NOTE — Plan of Care (Signed)
  Problem: Education: Goal: Knowledge of General Education information will improve Description: Including pain rating scale, medication(s)/side effects and non-pharmacologic comfort measures Outcome: Progressing   Problem: Health Behavior/Discharge Planning: Goal: Ability to manage health-related needs will improve Outcome: Progressing   Problem: Clinical Measurements: Goal: Ability to maintain clinical measurements within normal limits will improve Outcome: Progressing Goal: Will remain free from infection Outcome: Progressing Goal: Diagnostic test results will improve Outcome: Progressing Goal: Respiratory complications will improve Outcome: Progressing Goal: Cardiovascular complication will be avoided Outcome: Progressing   Problem: Activity: Goal: Risk for activity intolerance will decrease Outcome: Progressing   Problem: Nutrition: Goal: Adequate nutrition will be maintained Outcome: Progressing   Problem: Coping: Goal: Level of anxiety will decrease Outcome: Progressing   Problem: Elimination: Goal: Will not experience complications related to bowel motility Outcome: Progressing Goal: Will not experience complications related to urinary retention Outcome: Progressing   Problem: Pain Managment: Goal: General experience of comfort will improve Outcome: Progressing   Problem: Safety: Goal: Ability to remain free from injury will improve Outcome: Progressing   Problem: Skin Integrity: Goal: Risk for impaired skin integrity will decrease Outcome: Progressing   Problem: Education: Goal: Ability to demonstrate management of disease process will improve Outcome: Progressing Goal: Ability to verbalize understanding of medication therapies will improve Outcome: Progressing Goal: Individualized Educational Video(s) Outcome: Progressing   Problem: Activity: Goal: Capacity to carry out activities will improve Outcome: Progressing   Problem: Cardiac: Goal:  Ability to achieve and maintain adequate cardiopulmonary perfusion will improve Outcome: Progressing   Problem: Activity: Goal: Ability to tolerate increased activity will improve Outcome: Progressing   Problem: Respiratory: Goal: Ability to maintain a clear airway and adequate ventilation will improve Outcome: Progressing   Problem: Role Relationship: Goal: Method of communication will improve Outcome: Progressing

## 2020-09-11 NOTE — Procedures (Signed)
Extubation Procedure Note  Patient Details:   Name: Gary [name] DOB: October 27, 1950 MRN: [id number]   Airway Documentation:    Vent end date: 09/11/20 Vent end time: 1323   Evaluation  O2 sats: transiently fell during during procedure Complications: No apparent complications Patient did tolerate procedure well. Bilateral Breath Sounds: Diminished,Clear   Yes   Positive cuff leak noted. Patient placed on Heated High Flow Barry 30L, 50%. Sats 80% post extubation.  FIO2 increased to 100%, sats recovered to 96%. No stridor noted. Patient able to reach 375 mL using the incentive spirometer.  Mingo Amber Caleel Kiner 09/11/2020, 1:30 PM

## 2020-09-11 NOTE — Progress Notes (Signed)
Pt presents A/Ox4, resp labored but adequate, tolerated oral meds crushed in apple sauce, states pain and nausea relief from prn medications, temp 99.27F axillary, tyl given   will continue to assess and monitor.

## 2020-09-11 NOTE — Progress Notes (Signed)
NAME:  Gary [name], MRN:  [id number], DOB:  February 10, 1951, LOS: 7 ADMISSION DATE:  09/03/2020, CONSULTATION DATE:  09/07/2020 REFERRING MD:  Nori Riis, CHIEF COMPLAINT:  GI Bleed   History of Present Illness:  70 y/o M who presented to [facility] on 4/18 with complaints of shortness of breath.    The patient lives at home with his wife.  He is independent of all ADL's.  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  He is a former smoker - smoked from 1968-2005 off / on, heaviest 1.5 ppd.  He grew up in [place], [place] with coal burning stove.  He has multiple hobbies to include Civil War reenactment with black powder rifles, Actuary, & woodworking.  He denies known history of connective tissue disease within family.  His daughter had Turner's syndrome (deceased at age 20 in 70).  The patient reports he had a sleep study in 2016 which did not warrant intervention.  He also reports a significant nosebleed in 2015 that required balloon packing and additional procedure that sounded like embolization by description for bleeding (per ENT in [place]).  No hx of obstructive sleep apnea (had a negative sleep study in the past), no hx of blood clots.   He reports 3 months prior to presentation he had no shortness of breath with activity.  Patient noted in February 2022 that he began having shortness of breath with exertion and activities that normally were not difficult for him (walking uphill to go to church, taking the dogs out).  He also noted feeling full early in meals.  His wife reports that in the last 2 to 3 weeks he has had significant shortness of breath, decreased p.o. intake.  She noted that on 4/16 and 4/17 he was so short of breath to the point that he did not talk much over the weekend prompting evaluation on 4/18.  He reported difficulty walking into the emergency room.  His wife reported that he had poor  coloring with a grayish appearance.    The patient was admitted per teaching service for further evaluation.  Initially required 4 L of oxygen to maintain oxygen saturations which was quickly escalated to 15 L salter.  Cardiology was consulted with new finding of atrial fibrillation.  Echo evaluation demonstrated severe RV enlargement with severely depressed RV systolic function and moderately elevated PASP, LVEF 55-60%, difficult to assess wall motion abnormalities but appeared grossly normal, mild concentric left ventricular hypertrophy, RV systolic function severely reduced, RV severely enlarged, moderately elevated pulmonary artery pressure with estimated RV systolic pressure of 44.0, RA moderately dilated.  Given echo findings, a CTA of the chest was evaluated and negative for pulmonary embolism but demonstrated enlarged central pulmonary arteries consistent with pulmonary arterial hypertension, enlargement of the right ventricle and right atrium, small bilateral pleural effusions greater on the right and patchy groundglass opacities bilaterally, unresolved right middle lobe opacity.  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  He is currently on 8 L per HFNC with sats of 95% PCCM consulted for evaluation of hypoxemic respiratory failure and catheterization  findings.   Pertinent  Medical History  Former smoker HTN DM Traumatic crush injury from a tree branch with multiple fractures Epistaxis requiring packing   Significant Hospital Events: Including procedures, antibiotic start and stop dates in addition to other pertinent events    4/18 admitted with SOB, echo with  elevated right heart pressures, new finding of A. fib  4/19 R/L heart cath with concern for pulmonary hypertension  4/20 on 15L HFNC  4/21 hypotensive episode, responded to IVF, down to 5L O2  4/22 ABLA, to ICU, intubated, severe hypoxemia; EGD with nonbleeding duodenal ulcer  4/23 failed veletri wean   Interim History / Subjective:  Remains intubated. Awake on exam, able to follow commands. Tried to wean veletri yesterday but O2 worsened so FiO2 increased to 50%. Veletri maintained at 10ng/kg/min. coox 61%   Objective   Blood pressure 106/71, pulse 77, temperature 99.7 F (37.6 C), resp. rate (!) 22, height 5' 7"  (1.702 m), weight 84.5 kg, SpO2 96 %. CVP:  [3 mmHg-10 mmHg] 4 mmHg  Vent Mode: PRVC FiO2 (%):  [40 %-50 %] 50 % Set Rate:  [35 bmp] 35 bmp Vt Set:  [520 mL] 520 mL PEEP:  [8 cmH20] 8 cmH20 Plateau Pressure:  [21 cmH20-22 cmH20] 22 cmH20   Intake/Output Summary (Last 24 hours) at 09/11/2020 0706 Last data filed at 09/11/2020 0600 Gross per 24 hour  Intake 2848.78 ml  Output 2915 ml  Net -66.22 ml   Filed Weights   09/07/20 0100 09/10/20 0349 09/11/20 0440  Weight: 86.6 kg 84.4 kg 84.5 kg    Examination: Constitutional: intubated, drowsy but awakens to voice  Eyes: pupils equal, opens eyes on command Ears, nose, mouth, and throat: ETT in place, no secretions, trachea midline Cardiovascular: sinus rhythm, regular rate, warm extremities. No lower or upper extremity edema. Respiratory: air movement throughout bilaterally, coarse breath sounds Gastrointestinal: Soft. Bowel sounds present Skin: Warm, no rashes or lesions Neurologic: drowsy. 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 Trial off veletri. Increase to sildenafil 40 TID per HF team. - Continue midodrine (started 4/23) as needed and norepinephrine for hypotension - Continue amiodarone for Afib. Hold AC in setting of anemia and GIB - Try to wean off vent today and extubate if pt tolerates wean. -  Per HF team:  Pt to receive 1u RBC this AM for ABLA and lasix 40. Will recheck Hgb. Repleting K. -  f/u duodenal biopsy, pantoprazole Q12HR - Ceftriaxone x 5 days (started 4/23) - Start lantus, TF - Needs OP workup for secondary pHTN and abnormal CT  Best practice (right click and "Reselect all SmartList Selections" daily)  Diet: TF Pain/Anxiety/Delirium protocol (if indicated): No VAP protocol (if indicated): Not indicated DVT prophylaxis: PAS hose GI prophylaxis: PPI Glucose control:  lantus/SSI  Central venous access:  No Arterial line: yep Foley:  yep Mobility:  bed rest   PT consulted:Yes Last date of multidisciplinary goals of care discussion: pending Code Status:  full code Disposition: ICU  Letitia Neri, MS4

## 2020-09-11 NOTE — Progress Notes (Signed)
[facility] ADULT ICU REPLACEMENT PROTOCOL   The patient does apply for the [facility] Adult ICU Electrolyte Replacment Protocol based on the criteria listed below:   1. Is GFR >/= 30 ml/min? Yes.    Patient's GFR today is >60 2. Is SCr </= 2? Yes.   Patient's SCr is 0.76 ml/kg/hr 3. Did SCr increase >/= 0.5 in 24 hours? No. 4. Abnormal electrolyte(s):  K 3.6 5. Ordered repletion with: protocol 6. If a panic level lab has been reported, has the CCM MD in charge been notified? Yes.  .   Physician:  Lowella Bandy R Shade Rivenbark 09/11/2020 5:23 AM

## 2020-09-11 NOTE — Progress Notes (Signed)
Patient ID: Gary [name], male   DOB: May 16, 1951, 70 y.o.   MRN: [id number]     Advanced Heart Failure Rounding Note  PCP-Cardiologist: Elouise Munroe, MD   Subjective:   Events  4/22  EGD 4/22 showed 2 ulcers in duodenum that were not actively bleeding.  He remains on Protonix gtt, no overt bleeding today.  Hgb 10.8    4/23 Intubated. Dobutamine stopped due to marked tachycardia. Started antibiotics for pneumonia. Blood CX- NGTD. Sputum with strep.  4/24 Did not tolerate fast veletri wean.   Remains on NE 5, vasopressin 0.03, milrinone 0.125 mcg, amio 30 mg. CO-OX 61%.   Remains on veletri 10. Apparently oxygenation got worse yesterday when it was weaned.  Awake on vent following commands. Hgb 7.1 no obvious ongoing bleed.   Objective:   Weight Range: 84.5 kg Body mass index is 29.18 kg/m.   Vital Signs:   Temp:  [98.2 F (36.8 C)-99.7 F (37.6 C)] 99.7 F (37.6 C) (04/26 0600) Pulse Rate:  [65-95] 77 (04/26 0600) Resp:  [14-35] 22 (04/26 0600) BP: (98-144)/(53-74) 106/71 (04/26 0600) SpO2:  [86 %-100 %] 96 % (04/26 0609) Arterial Line BP: (68-154)/(44-83) 122/44 (04/26 0600) FiO2 (%):  [40 %-50 %] 50 % (04/26 0609) Weight:  [84.5 kg] 84.5 kg (04/26 0440) Last BM Date: 09/09/20  Weight change: Filed Weights   09/07/20 0100 09/10/20 0349 09/11/20 0440  Weight: 86.6 kg 84.4 kg 84.5 kg    Intake/Output:   Intake/Output Summary (Last 24 hours) at 09/11/2020 0636 Last data filed at 09/11/2020 0600 Gross per 24 hour  Intake 2920.09 ml  Output 2915 ml  Net 5.09 ml      Physical Exam   General: awake on vent  HEENT: normal + ETT Neck: supple. LIJ TLC. Carotids 2+ bilat; no bruits. No lymphadenopathy or thryomegaly appreciated. Cor: PMI nondisplaced. Regular rate & rhythm. No rubs, gallops or murmurs. Lungs: coarse Abdomen:  Obese soft, nontender, nondistended. No hepatosplenomegaly. No bruits or masses. Good bowel sounds. Extremities: no cyanosis, clubbing,  rash, edema Neuro: alert & orientedx3, cranial nerves grossly intact. moves all 4 extremities w/o difficulty. Affect pleasant   Telemetry   SR 70-80s Personally reviewed  Labs    CBC Recent Labs    09/10/20 0444 09/10/20 0518 09/11/20 0344  WBC 10.8*  --  8.2  HGB 7.2* 7.1* 7.2*  HCT 22.5* 21.0* 22.7*  MCV 94.5  --  95.8  PLT 242  --  756   Basic Metabolic Panel Recent Labs    09/10/20 0444 09/10/20 0518 09/11/20 0344  NA 138 138 139  K 3.4* 3.3* 3.6  CL 100  --  100  CO2 30  --  31  GLUCOSE 208*  --  201*  BUN 36*  --  28*  CREATININE 0.92  --  0.76  CALCIUM 7.7*  --  7.6*   Liver Function Tests No results for input(s): AST, ALT, ALKPHOS, BILITOT, PROT, ALBUMIN in the last 72 hours. No results for input(s): LIPASE, AMYLASE in the last 72 hours. Cardiac Enzymes No results for input(s): CKTOTAL, CKMB, CKMBINDEX, TROPONINI in the last 72 hours.  BNP: BNP (last 3 results) Recent Labs    09/03/20 1433  BNP 1,202.7*    ProBNP (last 3 results) No results for input(s): PROBNP in the last 8760 hours.   D-Dimer No results for input(s): DDIMER in the last 72 hours. Hemoglobin A1C No results for input(s): HGBA1C in the last 72 hours. Fasting Lipid  Panel Recent Labs    09/11/20 0344  TRIG 61   Thyroid Function Tests No results for input(s): TSH, T4TOTAL, T3FREE, THYROIDAB in the last 72 hours.  Invalid input(s): FREET3  Other results:   Imaging    No results found.   Medications:     Scheduled Medications: . sodium chloride   Intravenous Once  . atorvastatin  40 mg Per Tube Daily  . chlorhexidine gluconate (MEDLINE KIT)  15 mL Mouth Rinse BID  . Chlorhexidine Gluconate Cloth  6 each Topical Daily  . insulin aspart  0-24 Units Subcutaneous Q4H  . insulin glargine  15 Units Subcutaneous Daily  . mouth rinse  15 mL Mouth Rinse 10 times per day  . midodrine  10 mg Per Tube TID  . pantoprazole  40 mg Intravenous Q12H  . sodium chloride flush   10-40 mL Intracatheter Q12H    Infusions: . sodium chloride Stopped (09/10/20 1336)  . sodium chloride 10 mL/hr at 09/11/20 0600  . amiodarone 30 mg/hr (09/11/20 0600)  . cefTRIAXone (ROCEPHIN)  IV Stopped (09/10/20 1406)  . epoprostenol (VELETRI) for inhalation 1.28m/50mL (30,000 ng/mL) 10 ng/kg/min (09/11/20 0326)  . feeding supplement (VITAL AF 1.2 CAL) 1,000 mL (09/10/20 1511)  . fentaNYL infusion INTRAVENOUS 50 mcg/hr (09/11/20 0600)  . milrinone 0.125 mcg/kg/min (09/11/20 0600)  . norepinephrine (LEVOPHED) Adult infusion 5 mcg/min (09/11/20 0600)  . propofol (DIPRIVAN) infusion 10 mcg/kg/min (09/11/20 0600)  . vasopressin 0.03 Units/min (09/11/20 0600)    PRN Medications: sodium chloride, acetaminophen, fentaNYL, ondansetron (ZOFRAN) IV, sodium chloride flush    Assessment/Plan    1. Acute upper GI bleed with symptomatic anemia - hgb down to 6.7 + melena on 4/22, had 2 units PRBCs.  Hgb stable overnight 8.2>7.2 -> 7.2  - Eliquis off - CT negative for RP bleed - On Protonix gtt for 1 more day. - EGD with 2 ulcers not acutely bleeding.  - No overt bleeding overnight.   2. PAH with cor pulmonale - CT chest 4/22: No PE or ILD - Echo LVEF 60% severe RV dilation and HK - Cath with moderate PAH. PA = 66/31 (46) PCW = 13 Fick = 4.1/2.1 PVR = 7.8 WU - Suspect this is WHO Group 3 PAH due to hypoxic lung disease/OHS/OSA - Will order PFTs (when better compensated) and will need outpatient sleep study - Check serologies for completeness sake>> in process  - Will need home O2.  - CVP 5.   On norepi 5 mcg and vasopressin. CO-OX stable at 61%. Stop VP  - CVP improved. Will give 1u RBCs with lasix 40 iv this am - On Veletri, weaned to 10 . Does not tolerate wean well which is surprising to me as he does not appear to have Group I PAH. Continue milrinone 0.125 mcg/kg/min today to aide wean (now that he is in NSR - will try to maintain with amiodarone).  Increase sildenafil to 40 tid.     3. Paroxysmal AF - new onset. Unclear duration  - Eliquis on hold d/t GI bleed - Now in NSR. Continue iv amio while on pressors.  - No AC with recent large GI bleed   4. CAD  -mostly non-obstructive - atorva 40 added  - No ASA with AC - No s/s ischemia  5. R lung mass - will need CT f/u   6. Shock - Suspect mixed hemorrhagic/cardiogenic (RV failure).  May have septic/distributive component with fever.   - Remains Norepi 8 mcg. Stop VP.  Add midodrine as needed  6. ID - Afebrile today.  PCT 0.7    - Bld CX - NGT - Sputum - with few strep. .   - He is empirically on ceftriaxone for ?PNA.   7. Acute hypoxemic respiratory failure - Intubated pre-EGD on 4/22.   - Wean vent as tolerated. Limited by underlying COPD  - CCM appreciated.   CRITICAL CARE Performed by: Glori Bickers  Total critical care time: 35 minutes  Critical care time was exclusive of separately billable procedures and treating other patients.  Critical care was necessary to treat or prevent imminent or life-threatening deterioration.  Critical care was time spent personally by me (independent of midlevel providers or residents) on the following activities: development of treatment plan with patient and/or surrogate as well as nursing, discussions with consultants, evaluation of patient's response to treatment, examination of patient, obtaining history from patient or surrogate, ordering and performing treatments and interventions, ordering and review of laboratory studies, ordering and review of radiographic studies, pulse oximetry and re-evaluation of patient's condition.    Length of Stay: [place], MD  09/11/2020, 6:36 AM  Advanced Heart Failure Team Pager [phone] (M-F; 7a - 5p)  Please contact [facility] for night-coverage after hours (5p -7a ) and weekends on [email or web address]

## 2020-09-11 NOTE — Progress Notes (Signed)
Inhaled epoprostenol (VELETRI) Monitoring:  Plan to continue inhaled epoprostenol at 10 ng/kg/min for now, patient failed fast wean over weekend. May try slow wean later today.   Current dose:  10 ng/kg/min and 1.32 mL/hr IBW: 66.1 kg Duration of each syringe (at current rate): 37.8 hr Last syringe hung (date/time): 09/09/20 at 2215 Next syringe due (date/time): 09/11/20 at ~1200 Backup on unit: Yes  Current plan: Continue at current dose of 10 ng/kg/min - f/u plans for dose adjustments.   Erin Hearing PharmD., BCPS Clinical Pharmacist 09/11/2020 10:57 AM  Please check AMION for all [place] phone numbers After 10:00 PM, call [employer] [phone]

## 2020-09-12 DIAGNOSIS — J9601 Acute respiratory failure with hypoxia: Secondary | ICD-10-CM | POA: Diagnosis not present

## 2020-09-12 LAB — BPAM RBC
Blood Product Expiration Date: 202205212359
ISSUE DATE / TIME: 202204260943
Unit Type and Rh: 5100

## 2020-09-12 LAB — COOXEMETRY PANEL
Carboxyhemoglobin: 1.1 % (ref 0.5–1.5)
Carboxyhemoglobin: 0.8 % (ref 0.5–1.5)
Carboxyhemoglobin: 1.2 % (ref 0.5–1.5)
Methemoglobin: 0.5 % (ref 0.0–1.5)
Methemoglobin: 0.4 % (ref 0.0–1.5)
Methemoglobin: 0.9 % (ref 0.0–1.5)
O2 Saturation: 99.4 %
O2 Saturation: 64.1 %
O2 Saturation: 95.9 %
Total hemoglobin: 9.5 g/dL — ABNORMAL LOW (ref 12.0–16.0)
Total hemoglobin: 14.4 g/dL (ref 12.0–16.0)
Total hemoglobin: 9.4 g/dL — ABNORMAL LOW (ref 12.0–16.0)

## 2020-09-12 LAB — POCT I-STAT 7, (LYTES, BLD GAS, ICA,H+H)
Acid-Base Excess: 7 mmol/L — ABNORMAL HIGH (ref 0.0–2.0)
Bicarbonate: 33.8 mmol/L — ABNORMAL HIGH (ref 20.0–28.0)
Calcium, Ion: 1.11 mmol/L — ABNORMAL LOW (ref 1.15–1.40)
HCT: 27 % — ABNORMAL LOW (ref 39.0–52.0)
Hemoglobin: 9.2 g/dL — ABNORMAL LOW (ref 13.0–17.0)
O2 Saturation: 91 %
Patient temperature: 37.4
Potassium: 4.7 mmol/L (ref 3.5–5.1)
Sodium: 142 mmol/L (ref 135–145)
TCO2: 35 mmol/L — ABNORMAL HIGH (ref 22–32)
pCO2 arterial: 58.2 mmHg — ABNORMAL HIGH (ref 32.0–48.0)
pH, Arterial: 7.373 (ref 7.350–7.450)
pO2, Arterial: 67 mmHg — ABNORMAL LOW (ref 83.0–108.0)

## 2020-09-12 LAB — CBC
HCT: 29.7 % — ABNORMAL LOW (ref 39.0–52.0)
Hemoglobin: 9.3 g/dL — ABNORMAL LOW (ref 13.0–17.0)
MCH: 30.5 pg (ref 26.0–34.0)
MCHC: 31.3 g/dL (ref 30.0–36.0)
MCV: 97.4 fL (ref 80.0–100.0)
Platelets: 229 10*3/uL (ref 150–400)
RBC: 3.05 MIL/uL — ABNORMAL LOW (ref 4.22–5.81)
RDW: 15.2 % (ref 11.5–15.5)
WBC: 6.6 10*3/uL (ref 4.0–10.5)
nRBC: 0.5 % — ABNORMAL HIGH (ref 0.0–0.2)

## 2020-09-12 LAB — TYPE AND SCREEN
ABO/RH(D): O POS
Antibody Screen: NEGATIVE
Unit division: 0

## 2020-09-12 LAB — BASIC METABOLIC PANEL
Anion gap: 8 (ref 5–15)
BUN: 26 mg/dL — ABNORMAL HIGH (ref 8–23)
CO2: 31 mmol/L (ref 22–32)
Calcium: 8 mg/dL — ABNORMAL LOW (ref 8.9–10.3)
Chloride: 103 mmol/L (ref 98–111)
Creatinine, Ser: 0.87 mg/dL (ref 0.61–1.24)
GFR, Estimated: >60 mL/min (ref >60)
Glucose, Bld: 206 mg/dL — ABNORMAL HIGH (ref 70–99)
Potassium: 4.7 mmol/L (ref 3.5–5.1)
Sodium: 142 mmol/L (ref 135–145)

## 2020-09-12 LAB — GLUCOSE, CAPILLARY
Glucose-Capillary: 210 mg/dL — ABNORMAL HIGH (ref 70–99)
Glucose-Capillary: 183 mg/dL — ABNORMAL HIGH (ref 70–99)
Glucose-Capillary: 175 mg/dL — ABNORMAL HIGH (ref 70–99)
Glucose-Capillary: 246 mg/dL — ABNORMAL HIGH (ref 70–99)
Glucose-Capillary: 246 mg/dL — ABNORMAL HIGH (ref 70–99)

## 2020-09-12 MED ORDER — CHLORHEXIDINE GLUCONATE 0.12 % MT SOLN
15.0000 mL | Freq: Two times a day (BID) | OROMUCOSAL | Status: DC
  Administered 2020-09-12 – 2020-10-02 (×32): 15 mL via OROMUCOSAL
  Filled 2020-09-12 (×31): qty 15

## 2020-09-12 MED ORDER — ORAL CARE MOUTH RINSE
15.0000 mL | Freq: Two times a day (BID) | OROMUCOSAL | Status: DC
  Administered 2020-09-15 – 2020-10-02 (×26): 15 mL via OROMUCOSAL

## 2020-09-12 MED ORDER — ARFORMOTEROL TARTRATE 15 MCG/2ML IN NEBU
15.0000 ug | INHALATION_SOLUTION | Freq: Two times a day (BID) | RESPIRATORY_TRACT | Status: DC
  Administered 2020-09-12 – 2020-10-02 (×41): 15 ug via RESPIRATORY_TRACT
  Filled 2020-09-12 (×41): qty 2

## 2020-09-12 MED ORDER — INSULIN GLARGINE 100 UNIT/ML ~~LOC~~ SOLN
5.0000 [IU] | Freq: Once | SUBCUTANEOUS | Status: AC
  Administered 2020-09-13: 5 [IU] via SUBCUTANEOUS
  Filled 2020-09-12: qty 0.05

## 2020-09-12 MED ORDER — REVEFENACIN 175 MCG/3ML IN SOLN
175.0000 ug | Freq: Every day | RESPIRATORY_TRACT | Status: DC
  Administered 2020-09-12 – 2020-10-02 (×21): 175 ug via RESPIRATORY_TRACT
  Filled 2020-09-12 (×21): qty 3

## 2020-09-12 NOTE — Progress Notes (Addendum)
NAME:  Gary [name], MRN:  [id number], DOB:  1950/08/02, LOS: 8 ADMISSION DATE:  09/03/2020, CONSULTATION DATE:  09/07/2020 REFERRING MD:  Nori Riis, CHIEF COMPLAINT:  GI Bleed   History of Present Illness:  70 y/o M who presented to [facility] on 4/18 with complaints of shortness of breath.    The patient lives at home with his wife.  He is independent of all ADL's.  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  He is a former smoker - smoked from 1968-2005 off / on, heaviest 1.5 ppd.  He grew up in [place], [place] with coal burning stove.  He has multiple hobbies to include Civil War reenactment with black powder rifles, Actuary, & woodworking.  He denies known history of connective tissue disease within family.  His daughter had Turner's syndrome (deceased at age 78 in 74).  The patient reports he had a sleep study in 2016 which did not warrant intervention.  He also reports a significant nosebleed in 2015 that required balloon packing and additional procedure that sounded like embolization by description for bleeding (per ENT in [place]).  No hx of obstructive sleep apnea (had a negative sleep study in the past), no hx of blood clots.   He reports 3 months prior to presentation he had no shortness of breath with activity.  Patient noted in February 2022 that he began having shortness of breath with exertion and activities that normally were not difficult for him (walking uphill to go to church, taking the dogs out).  He also noted feeling full early in meals.  His wife reports that in the last 2 to 3 weeks he has had significant shortness of breath, decreased p.o. intake.  She noted that on 4/16 and 4/17 he was so short of breath to the point that he did not talk much over the weekend prompting evaluation on 4/18.  He reported difficulty walking into the emergency room.  His wife reported that he had poor  coloring with a grayish appearance.    The patient was admitted per teaching service for further evaluation.  Initially required 4 L of oxygen to maintain oxygen saturations which was quickly escalated to 15 L salter.  Cardiology was consulted with new finding of atrial fibrillation.  Echo evaluation demonstrated severe RV enlargement with severely depressed RV systolic function and moderately elevated PASP, LVEF 55-60%, difficult to assess wall motion abnormalities but appeared grossly normal, mild concentric left ventricular hypertrophy, RV systolic function severely reduced, RV severely enlarged, moderately elevated pulmonary artery pressure with estimated RV systolic pressure of 73.4, RA moderately dilated.  Given echo findings, a CTA of the chest was evaluated and negative for pulmonary embolism but demonstrated enlarged central pulmonary arteries consistent with pulmonary arterial hypertension, enlargement of the right ventricle and right atrium, small bilateral pleural effusions greater on the right and patchy groundglass opacities bilaterally, unresolved right middle lobe opacity.  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  He is currently on 8 L per HFNC with sats of 95% PCCM consulted for evaluation of hypoxemic respiratory failure and catheterization  findings.   Pertinent  Medical History  Former smoker HTN DM Traumatic crush injury from a tree branch with multiple fractures Epistaxis requiring packing   Significant Hospital Events: Including procedures, antibiotic start and stop dates in addition to other pertinent events    4/18 admitted with SOB, echo with  elevated right heart pressures, new finding of A. fib  4/19 R/L heart cath with concern for pulmonary hypertension  4/20 on 15L HFNC  4/21 hypotensive episode, responded to IVF, down to 5L O2  4/22 ABLA, to ICU, intubated, severe hypoxemia; EGD with nonbleeding duodenal ulcer  4/23 failed veletri wean  4/26 veletri weaned and pt extubated   Interim History / Subjective:  Pt feeling "ok" this morning and does not complain of pain. Slept on and off last night. coox 64% H/H improved to 9.3  Objective   Blood pressure 111/62, pulse 99, temperature 98.8 F (37.1 C), resp. rate 19, height 5' 7"  (1.702 m), weight 84.6 kg, SpO2 (!) 87 %. CVP:  [0 mmHg-11 mmHg] 1 mmHg  Vent Mode: PSV;CPAP FiO2 (%):  [50 %-100 %] 100 % Set Rate:  [28 bmp-35 bmp] 28 bmp Vt Set:  [520 mL] 520 mL PEEP:  [5 cmH20-8 cmH20] 5 cmH20 Pressure Support:  [5 cmH20] 5 cmH20 Plateau Pressure:  [19 cmH20-22 cmH20] 19 cmH20   Intake/Output Summary (Last 24 hours) at 09/12/2020 0657 Last data filed at 09/12/2020 0500 Gross per 24 hour  Intake 1733.83 ml  Output 1670 ml  Net 63.83 ml   Filed Weights   09/10/20 0349 09/11/20 0440 09/12/20 0500  Weight: 84.4 kg 84.5 kg 84.6 kg    Examination: Constitutional: ill-appearing, fatigued. Awake, on HFNC Eyes: pupils equal Ears, nose, mouth, and throat: wnl Cardiovascular: sinus rhythm, regular rate, warm extremities. No lower or upper extremity edema. Respiratory: air movement throughout bilaterally, coarse breath sounds still present on right. Increased work of breathing. Gastrointestinal: Soft. Bowel sounds present Skin: Warm, no rashes or lesions Neurologic: Alert. Oriented to person, place and year, but not to month.   Labs/imaging that I havepersonally reviewed  (right click and "Reselect all SmartList Selections" daily)   Coox  WBC improved H/H improved. Electrolytes within normal limits.  Resolved Hospital Problem list     Assessment & Plan:  #  Pulmonary HTN, cor pulmonale, likely secondary to OSA, COPD # Acute hypoxemic respiratory failure secondary to RV failure and possible R pneumonia- ceftriaxone started 4/23. End date 4/28. # Afib not on AC due to recent GIB # ABLA thought secondary to duodenal ulcer Hgb improving # Abnormal CT question of RML mass needs OP f/u; less impressed by this # AKI resolved with diuresis  - Outpatient workup for pHTN - PFTs, sleep study - Bronchodilator therapy (aformoterol and revefenacin nebs), inhaled corticosteroid (budesonide) and decadron for possible COPD - Incentive spirometry and mobilization. PT/OT consulted. - Stop milrinone tomorrow (4/28). Continue sildenafil. - Norepi stopped last night (4/26). Midodrine as needed. - Continue amiodarone for Afib. No AC in setting of recent GI bleed. -  f/u duodenal biopsy, pantoprazole Q12HR - Ceftriaxone x 5 days (started 4/23) - Start lantus, TF - Transfer to step-down today (4/27)  Best practice (right click and "Reselect all SmartList Selections" daily)  Diet: TF Pain/Anxiety/Delirium protocol (if indicated): No VAP protocol (if indicated): Not indicated DVT prophylaxis: PAS hose GI prophylaxis: PPI Glucose control:  lantus/SSI Central venous access:  No Arterial line: yep Foley:  yep Mobility:  bed rest   PT consulted:Yes Last  date of multidisciplinary goals of care discussion: pending Code Status:  full code Disposition: ICU  Letitia Neri, MS4   Pulmonary critical care attending:  This is a 70 year old gentleman admitted to the ICU for upper GI bleed, acute blood loss anemia, decompensated pulmonary arterial hypertension, cor pulmonale, history of CAD, mixed shock state requiring intubation mechanical ventilation for acute placement respiratory failure.  Patient  Patient was extubated yesterday doing well remains on low-dose milrinone.  Patient was also started on sildenafil.  BP 102/60 (BP Location: Right Arm)   Pulse 84    Temp 98.96 F (37.2 C) (Bladder)   Resp (!) 23   Ht 5' 7"  (1.702 m)   Wt 84.6 kg   SpO2 93%   BMI 29.21 kg/m   General: Elderly male, chronically ill-appearing on nasal cannula HEENT: High flow nasal cannula Heart: Regular rate rhythm, S1-S2, distant heart tones Lungs: Diminished breath sounds bilaterally no crackles no wheeze Abdomen: Obese soft nontender nondistended  Labs: Reviewed  Assessment: Upper GI bleed from ulcer, stable Hemoglobin stable, acute blood loss anemia secondary to above Mixed shock state, hemorrhagic shock plus decompensated right ventricular failure Pulmonary hypertension, cor pulmonale  Plan: Milrinone Continue diuresis Sildenafil Wean FiO2 to maintain sats above 90%. PT OT Up with I-S as much as possible.  Stable for transfer to cardiac telemetry floor.  Garner Nash, DO [facility] 09/12/2020 4:25 PM

## 2020-09-12 NOTE — Progress Notes (Signed)
Inpatient Rehab Admissions Coordinator Note:   Per PT recommendations, pt was screened for CIR candidacy by Shann Medal, PT, DPT.  At this time we are recommending CIR consult and I will request an order per our protocol.  Please contact me with questions.   Shann Medal, PT, DPT [phone] 09/12/20 3:38 PM

## 2020-09-12 NOTE — Evaluation (Signed)
Physical Therapy Evaluation Patient Details Name: Gary [name] MRN: [id number] DOB: 11-08-1950 Today's Date: 09/12/2020   History of Present Illness  Patient is a 70 y/o male who presents on 09/03/20 with progressive SOB. Found to have acute respiratory failure likely due to new onset of heart failure and A-fib with RVR. CXR- Rt effusion, prominent interstitial edema/disease. s/p cardiac cath 09/04/20. 4/21 hypotension, 4/22 drop in Hgb, GIB with intubation and urgent bedside EGD on 4/22 with PT sign off.  Pt was extubated on 4/26. PT reordered on 4/27.  PMhx: HTN and DM.  Clinical Impression  Pt admitted with above diagnosis. Pt was able to take pivotal steps to recliner with mod assist of 2 due to LE weakness as well as desaturation to 78% with activity with pt on 100% heated HFNC at 30L.  Feel that pt may need CIR to get stronger prior to going home since he has had medical complications. Will progress pt as able.  Pt currently with functional limitations due to the deficits listed below (see PT Problem List). Pt will benefit from skilled PT to increase their independence and safety with mobility to allow discharge to the venue listed below.      Follow Up Recommendations CIR    Equipment Recommendations  Rolling walker with 5" wheels;3in1 (PT)    Recommendations for Other Services       Precautions / Restrictions Precautions Precautions: Fall Precaution Comments: watch 02 (on high flow), HR Restrictions Weight Bearing Restrictions: No      Mobility  Bed Mobility Overal bed mobility: Needs Assistance Bed Mobility: Supine to Sit Rolling: Min guard Sidelying to sit: Min assist Supine to sit: Min assist     General bed mobility comments: A little assist for trunk elevation.    Transfers Overall transfer level: Needs assistance Equipment used: 2 person hand held assist Transfers: Sit to/from [facility] Sit to Stand: Mod assist;+2 physical assistance Stand  pivot transfers: Mod assist;+2 physical assistance       General transfer comment: Pt required bil UE support and assist to power up as well as assist to take pivotal steps to the chair.  Ambulation/Gait             General Gait Details: limited due to heated HFNC  Stairs            Wheelchair Mobility    Modified Rankin (Stroke Patients Only)       Balance Overall balance assessment: Needs assistance Sitting-balance support: No upper extremity supported;Feet supported Sitting balance-Leahy Scale: Fair     Standing balance support: Bilateral upper extremity supported;During functional activity Standing balance-Leahy Scale: Poor Standing balance comment: relies on UE support for balance as pt was unsteady on his feet.                             Pertinent Vitals/Pain Pain Assessment: No/denies pain    Home Living Family/patient expects to be discharged to:: Private residence Living Arrangements: Spouse/significant other Available Help at Discharge: Family;Available PRN/intermittently Type of Home: House Home Access: Stairs to enter;Ramped entrance Entrance Stairs-Rails: None Entrance Stairs-Number of Steps: 2 Home Layout: One level Home Equipment: Shower seat      Prior Function Level of Independence: Independent         Comments: Patient reports independent with ADLs, IADLs, cooking/cleaning. Cares for 10 dogs at home. Retired Arts administrator.     Hand Dominance   Dominant Hand: Right  Extremity/Trunk Assessment   Upper Extremity Assessment Upper Extremity Assessment: Defer to OT evaluation    Lower Extremity Assessment Lower Extremity Assessment: Generalized weakness    Cervical / Trunk Assessment Cervical / Trunk Assessment: Normal  Communication   Communication: No difficulties  Cognition Arousal/Alertness: Awake/alert Behavior During Therapy: WFL for tasks assessed/performed Overall Cognitive Status: Within Functional  Limits for tasks assessed                                 General Comments: pleasant, participatory      General Comments General comments (skin integrity, edema, etc.): Pt was on heated HFNC at 100%, 30L with sats on arrival at 88-90%.  Desat to 78% with activity and incr time to return to 90-92% with pt sitting in chair.  Other VSS.    Exercises General Exercises - Lower Extremity Ankle Circles/Pumps: AROM;Both;10 reps;Seated Long Arc Quad: AROM;Both;15 reps;Seated   Assessment/Plan    PT Assessment Patient needs continued PT services  PT Problem List Decreased mobility;Cardiopulmonary status limiting activity;Decreased activity tolerance       PT Treatment Interventions Therapeutic exercise;Gait training;Stair training;Patient/family education;Therapeutic activities;Functional mobility training    PT Goals (Current goals can be found in the Care Plan section)  Acute Rehab PT Goals Patient Stated Goal: return to independence, be able to breathe PT Goal Formulation: With patient Time For Goal Achievement: 09/26/20 Potential to Achieve Goals: Good    Frequency Min 3X/week   Barriers to discharge Decreased caregiver support      Co-evaluation               AM-PAC PT "6 Clicks" Mobility  Outcome Measure Help needed turning from your back to your side while in a flat bed without using bedrails?: A Little Help needed moving from lying on your back to sitting on the side of a flat bed without using bedrails?: A Little Help needed moving to and from a bed to a chair (including a wheelchair)?: A Lot Help needed standing up from a chair using your arms (e.g., wheelchair or bedside chair)?: A Lot Help needed to walk in hospital room?: A Lot Help needed climbing 3-5 steps with a railing? : A Lot 6 Click Score: 14    End of Session Equipment Utilized During Treatment: Gait belt;Oxygen Activity Tolerance: Patient limited by fatigue Patient left: in  chair;with call bell/phone within reach;with chair alarm set;with family/visitor present Nurse Communication: Mobility status PT Visit Diagnosis: Muscle weakness (generalized) (M62.81);Difficulty in walking, not elsewhere classified (R26.2);Other (comment)    Time: 1137-1200 PT Time Calculation (min) (ACUTE ONLY): 23 min   Charges:   PT Evaluation $PT Eval Moderate Complexity: 1 Mod PT Treatments $Therapeutic Activity: 8-22 mins        Renley Banwart M,PT Acute Rehab Services [phone] (pager)  [name] 09/12/2020, 1:04 PM

## 2020-09-12 NOTE — Plan of Care (Signed)
  Problem: Clinical Measurements: Goal: Cardiovascular complication will be avoided Outcome: Progressing   Problem: Activity: Goal: Risk for activity intolerance will decrease Outcome: Progressing   Problem: Nutrition: Goal: Adequate nutrition will be maintained Outcome: Progressing   Problem: Elimination: Goal: Will not experience complications related to urinary retention Outcome: Progressing   Problem: Pain Managment: Goal: General experience of comfort will improve Outcome: Progressing   Problem: Activity: Goal: Capacity to carry out activities will improve Outcome: Progressing   Problem: Cardiac: Goal: Ability to achieve and maintain adequate cardiopulmonary perfusion will improve Outcome: Progressing

## 2020-09-12 NOTE — Progress Notes (Addendum)
Patient ID: Christiaan Strebeck, male   DOB: [birth year], 70 y.o.   MRN: [id number]     Advanced Heart Failure Rounding Note  PCP-Cardiologist: Elouise Munroe, MD   Subjective:   Events  4/22  EGD 4/22 showed 2 ulcers in duodenum that were not actively bleeding.  He remains on Protonix gtt, no overt bleeding today.  Hgb 10.8    4/23 Intubated. Dobutamine stopped due to marked tachycardia. Started antibiotics for pneumonia. Blood CX- NGTD. Sputum with strep.  4/24 Did not tolerate fast veletri wean.  4/25 Veletri stopped. Extubated. Give 1u PRBC   Hgb 7.5>9.3   Off NE & vasopressin. On milrinone 0.125 mcg. CO-OX 64%.   Remains on amio 30 mg pe rhour.   On HFNC. Denies pain.  Objective:   Weight Range: 84.6 kg Body mass index is 29.21 kg/m.   Vital Signs:   Temp:  [98.8 F (37.1 C)-100.4 F (38 C)] 98.8 F (37.1 C) (04/27 0530) Pulse Rate:  [84-121] 99 (04/27 0530) Resp:  [16-35] 19 (04/27 0530) BP: (97-166)/(39-77) 111/62 (04/27 0500) SpO2:  [84 %-97 %] 87 % (04/27 0530) Arterial Line BP: (81-166)/(40-87) 113/47 (04/27 0530) FiO2 (%):  [50 %-100 %] 100 % (04/27 0400) Weight:  [84.6 kg] 84.6 kg (04/27 0500) Last BM Date: 09/09/20  Weight change: Filed Weights   09/10/20 0349 09/11/20 0440 09/12/20 0500  Weight: 84.4 kg 84.5 kg 84.6 kg    Intake/Output:   Intake/Output Summary (Last 24 hours) at 09/12/2020 0753 Last data filed at 09/12/2020 0500 Gross per 24 hour  Intake 1733.83 ml  Output 1670 ml  Net 63.83 ml      Physical Exam  CVP 1-2  General:  No resp difficulty HEENT: normal Neck: supple. no JVD. Carotids 2+ bilat; no bruits. No lymphadenopathy or thryomegaly appreciated. Cor: PMI nondisplaced. Regular rate & rhythm. No rubs, gallops or murmurs. Lungs: clear on HFNC Abdomen: soft, nontender, nondistended. No hepatosplenomegaly. No bruits or masses. Good bowel sounds. Extremities: no cyanosis, clubbing, rash, edema Neuro: alert & orientedx3, cranial nerves  grossly intact. moves all 4 extremities w/o difficulty. Affect pleasant    Telemetry   SR 70-80s Personally reviewed  Labs    CBC Recent Labs    09/11/20 0344 09/11/20 0929 09/12/20 0348  WBC 8.2  --  6.6  HGB 7.2* 7.5* 9.3*  HCT 22.7* 22.0* 29.7*  MCV 95.8  --  97.4  PLT 227  --  620   Basic Metabolic Panel Recent Labs    09/11/20 0344 09/11/20 0929 09/12/20 0348  NA 139 139 142  K 3.6 4.1 4.7  CL 100  --  103  CO2 31  --  31  GLUCOSE 201*  --  206*  BUN 28*  --  26*  CREATININE 0.76  --  0.87  CALCIUM 7.6*  --  8.0*   Liver Function Tests No results for input(s): AST, ALT, ALKPHOS, BILITOT, PROT, ALBUMIN in the last 72 hours. No results for input(s): LIPASE, AMYLASE in the last 72 hours. Cardiac Enzymes No results for input(s): CKTOTAL, CKMB, CKMBINDEX, TROPONINI in the last 72 hours.  BNP: BNP (last 3 results) Recent Labs    09/03/20 1433  BNP 1,202.7*    ProBNP (last 3 results) No results for input(s): PROBNP in the last 8760 hours.   D-Dimer No results for input(s): DDIMER in the last 72 hours. Hemoglobin A1C No results for input(s): HGBA1C in the last 72 hours. Fasting Lipid Panel Recent Labs  09/11/20 0344  TRIG 61   Thyroid Function Tests No results for input(s): TSH, T4TOTAL, T3FREE, THYROIDAB in the last 72 hours.  Invalid input(s): FREET3  Other results:   Imaging    No results found.   Medications:     Scheduled Medications: . sodium chloride   Intravenous Once  . atorvastatin  40 mg Per Tube Daily  . budesonide  0.5 mg Nebulization BID  . Chlorhexidine Gluconate Cloth  6 each Topical Daily  . dexamethasone (DECADRON) injection  4 mg Intravenous Q12H  . insulin aspart  0-24 Units Subcutaneous Q4H  . insulin glargine  20 Units Subcutaneous Daily  . mouth rinse  15 mL Mouth Rinse BID  . midodrine  10 mg Per Tube TID  . pantoprazole  40 mg Intravenous Q12H  . sildenafil  40 mg Oral TID  . sodium chloride flush   10-40 mL Intracatheter Q12H  . umeclidinium bromide  1 puff Inhalation Daily    Infusions: . sodium chloride Stopped (09/10/20 1336)  . sodium chloride 10 mL/hr at 09/12/20 0500  . amiodarone 30 mg/hr (09/12/20 0500)  . cefTRIAXone (ROCEPHIN)  IV Stopped (09/11/20 1458)  . feeding supplement (VITAL AF 1.2 CAL) Stopped (09/11/20 2000)  . fentaNYL infusion INTRAVENOUS Stopped (09/11/20 1322)  . milrinone 0.125 mcg/kg/min (09/12/20 0500)  . norepinephrine (LEVOPHED) Adult infusion Stopped (09/11/20 1730)  . propofol (DIPRIVAN) infusion Stopped (09/11/20 1322)    PRN Medications: sodium chloride, acetaminophen, fentaNYL, ondansetron (ZOFRAN) IV, sodium chloride flush    Assessment/Plan    1. Acute upper GI bleed with symptomatic anemia - hgb down to 6.7 + melena on 4/22, had 2 units PRBCs.  Hgb stable overnight 8.2>7.2 -> 7.2->9.3   - Eliquis off - CT negative for RP bleed - On Protonix gtt - EGD with 2 ulcers not acutely bleeding.  - No overt bleeding overnight.   2. PAH with cor pulmonale - CT chest 4/22: No PE or ILD - Echo LVEF 60% severe RV dilation and HK - Cath with moderate PAH. PA = 66/31 (46) PCW = 13 Fick = 4.1/2.1 PVR = 7.8 WU - Suspect this is WHO Group 3 PAH due to hypoxic lung disease/OHS/OSA - Will order PFTs (when better compensated) and will need outpatient sleep study - Check serologies for completeness sake>> in process  - Will need home O2.  - CVP 1-2.  Off pressors. Stable on milrinone . CO-OX stable.  - Off veletri  - On sildenafil 40 mg tid.   3. Paroxysmal AF - new onset. Unclear duration  - Eliquis on hold d/t GI bleed -Maintaining NSR.  - No AC with recent large GI bleed - Continue SCDs.    4. CAD  -mostly non-obstructive - atorva 40 added  - No ASA with AC - No chest pain.   5. R lung mass - will need CT f/u   6. Shock - Suspect mixed hemorrhagic/cardiogenic (RV failure).  May have septic/distributive component with fever.   -Off  vasopressin/norepi.  -CO-OX stable on milrinone 0.125 mcg.   6. ID - Afebrile today.  PCT 0.7    - Bld CX - NGTD - Sputum - with few strep pneumonia   - He is empirically on ceftriaxone for ?PNA.   7. Acute hypoxemic respiratory failure - Intubated pre-EGD on 4/22.   - Extubated--> on HFNC - CCM appreciated.   PT re consulted today.   Length of Stay: [place], NP  09/12/2020, 7:53 AM  Advanced Heart Failure Team Pager [phone] (M-F; 7a - 5p)  Please contact [facility] for night-coverage after hours (5p -7a ) and weekends on [email or web address]  Patient seen and examined with the above-signed Advanced Practice Provider and/or Housestaff. I personally reviewed laboratory data, imaging studies and relevant notes. I independently examined the patient and formulated the important aspects of the plan. I have edited the note to reflect any of my changes or salient points. I have personally discussed the plan with the patient and/or family.  Extubated yesterday. Remains on 100% HFNC O2. Off NE, VP and veletri. On milrinone 0.125. Co-ox 64% CVP 3-4 No bleeding Very weak. Remains in NSR.  General:  Weak appearing. No resp difficulty HEENT: normal Neck: supple. no JVD. Carotids 2+ bilat; no bruits. No lymphadenopathy or thryomegaly appreciated. Cor: PMI nondisplaced. Regular rate & rhythm. No rubs, gallops or murmurs. Lungs: coarse Abdomen: obese soft, nontender, nondistended. No hepatosplenomegaly. No bruits or masses. Good bowel sounds. Extremities: no cyanosis, clubbing, rash, edema Neuro: alert & orientedx3, cranial nerves grossly intact. moves all 4 extremities w/o difficulty. Affect pleasant  Now extubated. Hemodynamically stable on low dose milrinone.   O2 requirements far out of proportion to HF or PH. Suggests significant intrinsic lung disease or restrictive physiology. Will continue milrinone and IV amio for today. Hopefully can wean tomorrow. Will need to mobilize. Hopefully  can resume anticoagulation in next week or two.   Glori Bickers, MD  9:30 AM

## 2020-09-12 NOTE — Progress Notes (Signed)
eLink Physician-Brief Progress Note Patient Name: Gary [name] DOB: 03/24/1951 MRN: [id number]   Date of Service  09/12/2020  HPI/Events of Note  Pt CBG's cont to be elevated despite sliding scale and lantus. May we change sliding scale On dexa also.  Getting lantus 20 and already on SSI q4 hrly.  eICU Interventions  - add Lantus 5 units sq extra for now.      Intervention Category Intermediate Interventions: Hyperglycemia - evaluation and treatment  Elmer Sow 09/12/2020, 11:50 PM

## 2020-09-12 NOTE — Plan of Care (Signed)
  Problem: Education: Goal: Knowledge of General Education information will improve Description: Including pain rating scale, medication(s)/side effects and non-pharmacologic comfort measures Outcome: Progressing   Problem: Health Behavior/Discharge Planning: Goal: Ability to manage health-related needs will improve Outcome: Progressing   Problem: Clinical Measurements: Goal: Ability to maintain clinical measurements within normal limits will improve Outcome: Progressing Goal: Will remain free from infection Outcome: Progressing Goal: Diagnostic test results will improve Outcome: Progressing Goal: Respiratory complications will improve Outcome: Progressing Goal: Cardiovascular complication will be avoided Outcome: Progressing   Problem: Activity: Goal: Risk for activity intolerance will decrease Outcome: Progressing   Problem: Nutrition: Goal: Adequate nutrition will be maintained Outcome: Progressing   Problem: Coping: Goal: Level of anxiety will decrease Outcome: Progressing   Problem: Elimination: Goal: Will not experience complications related to bowel motility Outcome: Progressing Goal: Will not experience complications related to urinary retention Outcome: Progressing   Problem: Pain Managment: Goal: General experience of comfort will improve Outcome: Progressing   Problem: Safety: Goal: Ability to remain free from injury will improve Outcome: Progressing   Problem: Skin Integrity: Goal: Risk for impaired skin integrity will decrease Outcome: Progressing   Problem: Education: Goal: Ability to demonstrate management of disease process will improve Outcome: Progressing Goal: Ability to verbalize understanding of medication therapies will improve Outcome: Progressing Goal: Individualized Educational Video(s) Outcome: Progressing   Problem: Activity: Goal: Capacity to carry out activities will improve Outcome: Progressing   Problem: Cardiac: Goal:  Ability to achieve and maintain adequate cardiopulmonary perfusion will improve Outcome: Progressing   Problem: Activity: Goal: Ability to tolerate increased activity will improve Outcome: Progressing   Problem: Respiratory: Goal: Ability to maintain a clear airway and adequate ventilation will improve Outcome: Progressing   Problem: Role Relationship: Goal: Method of communication will improve Outcome: Progressing

## 2020-09-12 NOTE — Evaluation (Signed)
Clinical/Bedside Swallow Evaluation Patient Details  Name: Gary [name] MRN: [id number] Date of Birth: 07-14-1950  Today's Date: 09/12/2020 Time: SLP Start Time (ACUTE ONLY): 6578 SLP Stop Time (ACUTE ONLY): 0950 SLP Time Calculation (min) (ACUTE ONLY): 24 min  Past Medical History:  Past Medical History:  Diagnosis Date  . Diabetes mellitus without complication ([place])   . Epistaxis 2014   Secondary to a nasal abnormality, treated and resolved  . Hyperlipidemia due to type 2 diabetes mellitus ([place])   . Hypertension    Past Surgical History:  Past Surgical History:  Procedure Laterality Date  . BIOPSY  09/07/2020   Procedure: BIOPSY;  Surgeon: Jackquline Denmark, MD;  Location: [facility] ENDOSCOPY;  Service: Endoscopy;;  . ESOPHAGOGASTRODUODENOSCOPY N/A 09/07/2020   Procedure: ESOPHAGOGASTRODUODENOSCOPY (EGD);  Surgeon: Jackquline Denmark, MD;  Location: [facility] ENDOSCOPY;  Service: Endoscopy;  Laterality: N/A;  Bedside in ICU  . NOSE SURGERY  2014  . PELVIC FRACTURE SURGERY  2013  . RIGHT/LEFT HEART CATH AND CORONARY ANGIOGRAPHY N/A 09/04/2020   Procedure: RIGHT/LEFT HEART CATH AND CORONARY ANGIOGRAPHY;  Surgeon: Jolaine Artist, MD;  Location: [place] CV LAB;  Service: Cardiovascular;  Laterality: N/A;   HPI:  Pt is a 70 yo male presenting with progressive dyspnea. 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 He required intubation for acute GI bleed 4/22-4/26. EGD showed a large duodenal ulcer, Barrett's esophagus, small hiatal hernia. PMH includes: HTN, HLD, DM   Assessment / Plan / Recommendation Clinical Impression  Pt's vocal quality is moderately soft s/p several days of intubation, and he is currently requiring 30L via HFNC at 100% FiO2, maintaining SpO2 87-90%. He has immediate coughing with thin liquids as boluses become slightly larger. Although no overt s/s of aspiration are observed with thicker liquids or purees,  pt has signs of dysphagia that includes multiple subswallows per bolus, increasing to up to 5 swallows per 1/2 spoonful of puree. Recommend proceeding with instrumental swallow study prior to initiating PO diet given clinical presentation with risk for silent aspiration and current respiratory status. However, pt is not able to go to radiology today based on current O2 needs, and he does not want to pursue a FEES at bedside due to his h/o significant epistaxis requiring nasal surgeries. Per MD, pt may be able to wean supplemental O2 relatively quickly, so for now we will maintain NPO status except for essential meds crushed in puree and ice chips with frequent oral care. Will f/u for ongoing need for MBS and potential to complete study. SLP Visit Diagnosis: Dysphagia, unspecified (R13.10)    Aspiration Risk  Moderate aspiration risk    Diet Recommendation NPO except meds;Ice chips PRN after oral care   Medication Administration: Crushed with puree    Other  Recommendations Oral Care Recommendations: Oral care QID Other Recommendations: Have oral suction available   Follow up Recommendations  (tba)      Frequency and Duration min 2x/week  2 weeks       Prognosis Prognosis for Safe Diet Advancement: Good      Swallow Study   General HPI: Pt is a 70 yo male presenting with progressive dyspnea. 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 He required intubation for acute GI bleed 4/22-4/26. EGD showed a large duodenal ulcer, Barrett's esophagus, small hiatal hernia. PMH includes: HTN, HLD, DM Type of Study: Bedside Swallow Evaluation Previous  Swallow Assessment: none in chart Diet Prior to this Study: NPO Temperature Spikes Noted: Yes (100.4) Respiratory Status: Nasal cannula (30L 100% FiO2) History of Recent Intubation: Yes Length of Intubations (days): 4 days Date extubated: 09/11/20 Behavior/Cognition:  Alert;Pleasant mood;Cooperative Oral Cavity Assessment: Within Functional Limits Oral Care Completed by SLP: No Oral Cavity - Dentition: Edentulous (dentures in room but not in mouth) Vision: Functional for self-feeding Self-Feeding Abilities: Needs assist Patient Positioning: Upright in bed Baseline Vocal Quality: Low vocal intensity Volitional Cough: Strong Volitional Swallow: Able to elicit    Oral/Motor/Sensory Function Overall Oral Motor/Sensory Function: Within functional limits   Ice Chips Ice chips: Impaired Presentation: Spoon Pharyngeal Phase Impairments: Multiple swallows   Thin Liquid Thin Liquid: Impaired Presentation: Cup;Self Fed;Spoon;Straw Pharyngeal  Phase Impairments: Multiple swallows;Cough - Immediate    Nectar Thick Nectar Thick Liquid: Impaired Presentation: Straw Pharyngeal Phase Impairments: Multiple swallows   Honey Thick Honey Thick Liquid: Not tested   Puree Puree: Impaired Presentation: Spoon Pharyngeal Phase Impairments: Multiple swallows   Solid     Solid: Not tested      Osie Bond., M.A. [place] Pager [phone] Office [phone]  09/12/2020,10:13 AM

## 2020-09-13 DIAGNOSIS — J9601 Acute respiratory failure with hypoxia: Secondary | ICD-10-CM | POA: Diagnosis not present

## 2020-09-13 LAB — BASIC METABOLIC PANEL
Anion gap: 9 (ref 5–15)
BUN: 25 mg/dL — ABNORMAL HIGH (ref 8–23)
CO2: 33 mmol/L — ABNORMAL HIGH (ref 22–32)
Calcium: 8.1 mg/dL — ABNORMAL LOW (ref 8.9–10.3)
Chloride: 98 mmol/L (ref 98–111)
Creatinine, Ser: 0.8 mg/dL (ref 0.61–1.24)
GFR, Estimated: >60 mL/min (ref >60)
Glucose, Bld: 122 mg/dL — ABNORMAL HIGH (ref 70–99)
Potassium: 4.1 mmol/L (ref 3.5–5.1)
Sodium: 140 mmol/L (ref 135–145)

## 2020-09-13 LAB — CULTURE, BLOOD (ROUTINE X 2)
Culture: NO GROWTH
Culture: NO GROWTH
Special Requests: ADEQUATE
Special Requests: ADEQUATE

## 2020-09-13 LAB — COOXEMETRY PANEL
Carboxyhemoglobin: 1.2 % (ref 0.5–1.5)
Carboxyhemoglobin: 1 % (ref 0.5–1.5)
Methemoglobin: 1 % (ref 0.0–1.5)
Methemoglobin: 0.9 % (ref 0.0–1.5)
O2 Saturation: 82.8 %
O2 Saturation: 49.6 %
Total hemoglobin: 9 g/dL — ABNORMAL LOW (ref 12.0–16.0)
Total hemoglobin: 9.1 g/dL — ABNORMAL LOW (ref 12.0–16.0)

## 2020-09-13 LAB — CBC
HCT: 28 % — ABNORMAL LOW (ref 39.0–52.0)
Hemoglobin: 8.7 g/dL — ABNORMAL LOW (ref 13.0–17.0)
MCH: 30.5 pg (ref 26.0–34.0)
MCHC: 31.1 g/dL (ref 30.0–36.0)
MCV: 98.2 fL (ref 80.0–100.0)
Platelets: 273 10*3/uL (ref 150–400)
RBC: 2.85 MIL/uL — ABNORMAL LOW (ref 4.22–5.81)
RDW: 14.7 % (ref 11.5–15.5)
WBC: 10.1 10*3/uL (ref 4.0–10.5)
nRBC: 0.6 % — ABNORMAL HIGH (ref 0.0–0.2)

## 2020-09-13 LAB — GLUCOSE, CAPILLARY
Glucose-Capillary: 125 mg/dL — ABNORMAL HIGH (ref 70–99)
Glucose-Capillary: 126 mg/dL — ABNORMAL HIGH (ref 70–99)
Glucose-Capillary: 131 mg/dL — ABNORMAL HIGH (ref 70–99)
Glucose-Capillary: 135 mg/dL — ABNORMAL HIGH (ref 70–99)
Glucose-Capillary: 136 mg/dL — ABNORMAL HIGH (ref 70–99)
Glucose-Capillary: 158 mg/dL — ABNORMAL HIGH (ref 70–99)
Glucose-Capillary: 200 mg/dL — ABNORMAL HIGH (ref 70–99)

## 2020-09-13 MED ORDER — POLYETHYLENE GLYCOL 3350 17 G PO PACK
17.0000 g | PACK | Freq: Every day | ORAL | Status: DC
  Administered 2020-09-13 – 2020-09-23 (×9): 17 g via ORAL
  Filled 2020-09-13 (×15): qty 1

## 2020-09-13 MED ORDER — ATORVASTATIN CALCIUM 40 MG PO TABS
40.0000 mg | ORAL_TABLET | Freq: Every day | ORAL | Status: DC
  Administered 2020-09-13 – 2020-10-02 (×19): 40 mg via ORAL
  Filled 2020-09-13 (×19): qty 1

## 2020-09-13 MED ORDER — AMIODARONE LOAD VIA INFUSION
150.0000 mg | Freq: Once | INTRAVENOUS | Status: AC
  Administered 2020-09-13: 150 mg via INTRAVENOUS
  Filled 2020-09-13: qty 83.34

## 2020-09-13 MED ORDER — INSULIN ASPART 100 UNIT/ML IJ SOLN
0.0000 [IU] | Freq: Three times a day (TID) | INTRAMUSCULAR | Status: DC
  Administered 2020-09-13 (×2): 2 [IU] via SUBCUTANEOUS
  Administered 2020-09-14 (×2): 1 [IU] via SUBCUTANEOUS
  Administered 2020-09-15: 5 [IU] via SUBCUTANEOUS
  Administered 2020-09-15: 3 [IU] via SUBCUTANEOUS
  Administered 2020-09-16: 2 [IU] via SUBCUTANEOUS
  Administered 2020-09-16: 9 [IU] via SUBCUTANEOUS

## 2020-09-13 MED ORDER — ENSURE ENLIVE PO LIQD
237.0000 mL | Freq: Two times a day (BID) | ORAL | Status: DC
  Administered 2020-09-15 – 2020-09-18 (×6): 237 mL via ORAL

## 2020-09-13 MED ORDER — AMIODARONE LOAD VIA INFUSION
150.0000 mg | Freq: Once | INTRAVENOUS | Status: DC
  Filled 2020-09-13: qty 83.34

## 2020-09-13 MED ORDER — PANTOPRAZOLE SODIUM 40 MG PO TBEC
40.0000 mg | DELAYED_RELEASE_TABLET | Freq: Two times a day (BID) | ORAL | Status: AC
  Administered 2020-09-13 – 2020-09-30 (×35): 40 mg via ORAL
  Filled 2020-09-13 (×35): qty 1

## 2020-09-13 MED ORDER — MIDODRINE HCL 5 MG PO TABS
10.0000 mg | ORAL_TABLET | Freq: Three times a day (TID) | ORAL | Status: DC
  Administered 2020-09-13 – 2020-10-02 (×58): 10 mg via ORAL
  Filled 2020-09-13 (×59): qty 2

## 2020-09-13 NOTE — Progress Notes (Signed)
  Speech Language Pathology Treatment: Dysphagia  Patient Details Name: Gary [name] MRN: [id number] DOB: Oct 14, 1950 Today's Date: 09/13/2020 Time: 1751-0258 SLP Time Calculation (min) (ACUTE ONLY): 16 min  Assessment / Plan / Recommendation Clinical Impression  Pt remains on 100% heated HFNC at 30L, but his vocal quality sounds much improved today. Pt reports that it is nearing baseline, although he can typically get even louder. Across PO trials, pt has no overt s/s of aspiration and little to no second subswallows per bolus. He consumed three ounces of water consecutively without stopping or coughing, which is typically indicative of decreased risk for aspiration. With his current O2 needs there could still be some risk for aspiration even intermittently, but he is still not able to go down for MBS to more thoroughly evaluate orophrayngeal swallowing. Discussed this with pt and MD, who are both in agreement with starting a PO diet (Dys 3 solids, thin liquids) with careful monitoring and use of aspiration precautions. SLP will continue to follow as well.   HPI HPI: Pt is a 69 yo male presenting with progressive dyspnea. 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 He required intubation for acute GI bleed 4/22-4/26. EGD showed a large duodenal ulcer, Barrett's esophagus, small hiatal hernia. PMH includes: HTN, HLD, DM      SLP Plan  Continue with current plan of care       Recommendations  Diet recommendations: Dysphagia 3 (mechanical soft);Thin liquid Liquids provided via: Cup;Straw Medication Administration: Whole meds with puree Supervision: Patient able to self feed;Intermittent supervision to cue for compensatory strategies Compensations: Slow rate;Small sips/bites;Other (Comment) (Rest breaks PRN for respiratory status) Postural Changes and/or Swallow Maneuvers: Seated upright 90 degrees                 Oral Care Recommendations: Oral care BID Follow up Recommendations:  (tba) SLP Visit Diagnosis: Dysphagia, unspecified (R13.10) Plan: Continue with current plan of care       GO                Osie Bond., M.A. [facility] Pager [phone] Office [phone]  09/13/2020, 9:22 AM

## 2020-09-13 NOTE — Plan of Care (Signed)
  Problem: Education: Goal: Knowledge of General Education information will improve Description: Including pain rating scale, medication(s)/side effects and non-pharmacologic comfort measures Outcome: Progressing   Problem: Health Behavior/Discharge Planning: Goal: Ability to manage health-related needs will improve Outcome: Progressing   Problem: Clinical Measurements: Goal: Ability to maintain clinical measurements within normal limits will improve Outcome: Progressing Goal: Will remain free from infection Outcome: Progressing Goal: Diagnostic test results will improve Outcome: Progressing Goal: Respiratory complications will improve Outcome: Progressing Goal: Cardiovascular complication will be avoided Outcome: Progressing   Problem: Activity: Goal: Risk for activity intolerance will decrease Outcome: Progressing   Problem: Nutrition: Goal: Adequate nutrition will be maintained Outcome: Progressing   Problem: Coping: Goal: Level of anxiety will decrease Outcome: Progressing   Problem: Elimination: Goal: Will not experience complications related to bowel motility Outcome: Progressing Goal: Will not experience complications related to urinary retention Outcome: Progressing   Problem: Pain Managment: Goal: General experience of comfort will improve Outcome: Progressing   Problem: Safety: Goal: Ability to remain free from injury will improve Outcome: Progressing   Problem: Skin Integrity: Goal: Risk for impaired skin integrity will decrease Outcome: Progressing   Problem: Education: Goal: Ability to demonstrate management of disease process will improve Outcome: Progressing Goal: Ability to verbalize understanding of medication therapies will improve Outcome: Progressing Goal: Individualized Educational Video(s) Outcome: Progressing   Problem: Activity: Goal: Capacity to carry out activities will improve Outcome: Progressing   Problem: Cardiac: Goal:  Ability to achieve and maintain adequate cardiopulmonary perfusion will improve Outcome: Progressing   Problem: Activity: Goal: Ability to tolerate increased activity will improve Outcome: Progressing   Problem: Respiratory: Goal: Ability to maintain a clear airway and adequate ventilation will improve Outcome: Progressing   Problem: Role Relationship: Goal: Method of communication will improve Outcome: Progressing

## 2020-09-13 NOTE — Progress Notes (Signed)
IP rehab admissions - I met with patient and his wife at the bedside.  I gave them rehab booklets and explained inpatient rehab to wife.  Will follow along and await medical readiness before admitting to CIR.  Call for questions.  [phone]

## 2020-09-13 NOTE — PMR Pre-admission (Signed)
PMR Admission Coordinator Pre-Admission Assessment   Patient: Gary [name] is an 69 y.o., male MRN: [id number] DOB: 08/08/1950 Height: 5' 7" (170.2 cm) Weight: 82.2 kg   Insurance Information HMO:     PPO:      PCP:      IPA:      80/20:      OTHER:  PRIMARY: Medicare A and B      Policy#: [id number]      Subscriber: patient CM Name:        Phone#:       Fax#:   Pre-Cert#:        Employer: Retired Benefits:  Phone #:       Name: Checked in Passport One Source Eff. Date: 10/17/13     Deduct: $1556      Out of Pocket Max: none      Life Max: N/A CIR: 100%      SNF: 100 days Outpatient: 80%     Co-Pay: 20% Home Health: 100%      Co-Pay: none DME: 80%     Co-Pay: 20% Providers: patient's choice  SECONDARY: Generic commercial [employer] supplement      Policy#: [id number]     Phone#: [phone]   Financial Counselor:        Phone#:     The "Data Collection Information Summary" for patients in Inpatient Rehabilitation Facilities with attached "Privacy Act Statement-Health Care Records" was provided and verbally reviewed with: Patient and Family   Emergency Contact Information         Contact Information     Name Relation Home Work Mobile    [name],[name] Spouse [phone]         Current Medical History  Patient Admitting Diagnosis: Debility   History of Present Illness: A 69 y/o male who presents on 09/03/20 with progressive SOB. Found to have acute respiratory failure likely due to new onset of heart failure and A-fib with RVR. CXR- Rt effusion, prominent interstitial edema/disease. s/p cardiac cath 09/04/20. 4/21 hypotension, 4/22 drop in Hgb, GIB with intubation and urgent bedside EGD on 4/22 with PT sign off.  Pt was extubated on 4/26. PT reordered on 4/27.  PMhx: HTN and DM.   Patient's medical record from  [facility] has been reviewed by the rehabilitation admission coordinator and physician.   Past Medical History      Past Medical  History:  Diagnosis Date  . Diabetes mellitus without complication (HCC)    . Epistaxis 2014    Secondary to a nasal abnormality, treated and resolved  . Hyperlipidemia due to type 2 diabetes mellitus (HCC)    . Hypertension        Family History   family history includes Alzheimer's disease in his mother; Diabetes in his brother and mother; Heart Problems in his father; Stroke in his father.   Prior Rehab/Hospitalizations Has the patient had prior rehab or hospitalizations prior to admission? No   Has the patient had major surgery during 100 days prior to admission? No               Current Medications   Current Facility-Administered Medications:  .  0.9 %  sodium chloride infusion (Manually program via Guardrails IV Fluids), , Intravenous, Once, Bensimhon, Daniel R, MD .  0.9 %  sodium chloride infusion, 250 mL, Intravenous, PRN, Bensimhon, Daniel R, MD, Paused at 09/10/20 1336 .  0.9 %  sodium chloride infusion, 250 mL, Intravenous, Continuous, Bensimhon, Daniel   R, MD, Stopped at 09/24/20 0700 .  acetaminophen (TYLENOL) tablet 650 mg, 650 mg, Oral, Q4H PRN, Bensimhon, Daniel R, MD, 650 mg at 09/26/20 1142 .  amiodarone (PACERONE) tablet 200 mg, 200 mg, Oral, Daily, Bensimhon, Daniel R, MD, 200 mg at 10/02/20 0921 .  apixaban (ELIQUIS) tablet 5 mg, 5 mg, Oral, BID, Espinoza, Alejandra, DO, 5 mg at 10/02/20 0921 .  arformoterol (BROVANA) nebulizer solution 15 mcg, 15 mcg, Nebulization, BID, Bensimhon, Daniel R, MD, 15 mcg at 10/02/20 0834 .  atorvastatin (LIPITOR) tablet 40 mg, 40 mg, Oral, Daily, Bensimhon, Daniel R, MD, 40 mg at 10/02/20 0921 .  budesonide (PULMICORT) nebulizer solution 0.5 mg, 0.5 mg, Nebulization, BID, Bensimhon, Daniel R, MD, 0.5 mg at 10/02/20 0834 .  chlorhexidine (PERIDEX) 0.12 % solution 15 mL, 15 mL, Mouth Rinse, BID, Bensimhon, Daniel R, MD, 15 mL at 10/02/20 0921 .  diltiazem (CARDIZEM CD) 24 hr capsule 180 mg, 180 mg, Oral, Daily, Bensimhon, Daniel R, MD,  180 mg at 10/02/20 0921 .  empagliflozin (JARDIANCE) tablet 10 mg, 10 mg, Oral, Daily, Bensimhon, Daniel R, MD, 10 mg at 10/02/20 0921 .  feeding supplement (GLUCERNA SHAKE) (GLUCERNA SHAKE) liquid 237 mL, 237 mL, Oral, TID BM, Bensimhon, Daniel R, MD, 237 mL at 10/01/20 1419 .  ferrous sulfate tablet 325 mg, 325 mg, Oral, Q breakfast, Hensel, William A, MD, 325 mg at 10/02/20 0920 .  guaiFENesin-dextromethorphan (ROBITUSSIN DM) 100-10 MG/5ML syrup 5 mL, 5 mL, Oral, Q4H PRN, Bensimhon, Daniel R, MD, 5 mL at 09/17/20 0211 .  insulin aspart (novoLOG) injection 0-15 Units, 0-15 Units, Subcutaneous, TID WC, Bensimhon, Daniel R, MD, 2 Units at 10/02/20 0648 .  insulin glargine (LANTUS) injection 4 Units, 4 Units, Subcutaneous, Daily, Pray, Margaret E, MD .  magnesium sulfate IVPB 1 g 100 mL, 1 g, Intravenous, Once, Walsh, Tanya, MD, Last Rate: 100 mL/hr at 10/02/20 0928, 1 g at 10/02/20 0928 .  MEDLINE mouth rinse, 15 mL, Mouth Rinse, q12n4p, Bensimhon, Daniel R, MD, 15 mL at 10/01/20 1725 .  metFORMIN (GLUCOPHAGE) tablet 1,000 mg, 1,000 mg, Oral, BID WC, Hensel, William A, MD, 1,000 mg at 10/02/20 0920 .  midodrine (PROAMATINE) tablet 10 mg, 10 mg, Oral, TID WC, Bensimhon, Daniel R, MD, 10 mg at 10/02/20 0920 .  multivitamin with minerals tablet 1 tablet, 1 tablet, Oral, Daily, Bensimhon, Daniel R, MD, 1 tablet at 10/02/20 0921 .  ondansetron (ZOFRAN) injection 4 mg, 4 mg, Intravenous, Q6H PRN, Bensimhon, Daniel R, MD .  pantoprazole (PROTONIX) EC tablet 40 mg, 40 mg, Oral, Daily, Dagar, Anjali, MD, 40 mg at 10/02/20 0927 .  polyethylene glycol (MIRALAX / GLYCOLAX) packet 17 g, 17 g, Oral, Daily PRN, Espinoza, Alejandra, DO, 17 g at 10/02/20 0653 .  revefenacin (YUPELRI) nebulizer solution 175 mcg, 175 mcg, Nebulization, Daily, Bensimhon, Daniel R, MD, 175 mcg at 10/02/20 0834 .  saline (AYR) nasal gel with aloe 1 application, 1 application, Each Nare, BID, Bensimhon, Daniel R, MD, 1 application at  10/02/20 0920 .  sildenafil (REVATIO) tablet 40 mg, 40 mg, Oral, TID, Bensimhon, Daniel R, MD, 40 mg at 10/02/20 0920 .  sodium chloride (OCEAN) 0.65 % nasal spray 1 spray, 1 spray, Each Nare, PRN, Bensimhon, Daniel R, MD, 1 spray at 10/02/20 0920 .  sodium chloride flush (NS) 0.9 % injection 10-40 mL, 10-40 mL, Intracatheter, Q12H, Bensimhon, Daniel R, MD, 10 mL at 10/02/20 0924 .  sodium chloride flush (NS) 0.9 % injection 3 mL, 3 mL,   Intravenous, Q12H, Bensimhon, Daniel R, MD, 3 mL at 10/02/20 0924 .  torsemide (DEMADEX) tablet 20 mg, 20 mg, Oral, Daily, Bensimhon, Daniel R, MD, 20 mg at 10/02/20 0923   Patients Current Diet:     Diet Order                      Diet Heart Room service appropriate? Yes; Fluid consistency: Thin  Diet effective now                      Precautions / Restrictions Precautions Precautions: Fall,Other (comment) Precaution Comments: Watch SpO2 Restrictions Weight Bearing Restrictions: No    Has the patient had 2 or more falls or a fall with injury in the past year? No   Prior Activity Level Limited Community (1-2x/wk): Went out of house 3-4 days a week.   Prior Functional Level Self Care: Did the patient need help bathing, dressing, using the toilet or eating? Independent   Indoor Mobility: Did the patient need assistance with walking from room to room (with or without device)? Independent   Stairs: Did the patient need assistance with internal or external stairs (with or without device)? Independent   Functional Cognition: Did the patient need help planning regular tasks such as shopping or remembering to take medications? Independent   Home Assistive Devices / Equipment Home Assistive Devices/Equipment: None Home Equipment: Shower seat   Prior Device Use: Indicate devices/aids used by the patient prior to current illness, exacerbation or injury? None of the above   Current Functional Level Cognition   Overall Cognitive Status: Within  Functional Limits for tasks assessed Orientation Level: Oriented X4 General Comments: Some anxiety related to SOB with mobility; responds well to cues for pursed lip breathing (good recall from prior sessions) and encouragement    Extremity Assessment (includes Sensation/Coordination)   Upper Extremity Assessment: Overall WFL for tasks assessed  Lower Extremity Assessment: Defer to PT evaluation     ADLs   Overall ADL's : Needs assistance/impaired Eating/Feeding: Set up,Sitting Grooming: Wash/dry face,Wash/dry hands,Oral care,Set up,Sitting,Standing Grooming Details (indicate cue type and reason): Initially standing with seated rest breaks 4WRW Upper Body Bathing: Set up,Sitting Lower Body Bathing: Min guard,Sitting/lateral leans Lower Body Bathing Details (indicate cue type and reason): simulated via LB dressing from recliner Upper Body Dressing : Minimal assistance,Sitting Lower Body Dressing: Minimal assistance,Sit to/from stand,Cueing for compensatory techniques Lower Body Dressing Details (indicate cue type and reason): Pt requires assist to fully leg go of walker when pulling pants up.  Cues given to let go one hand at a time. Toilet Transfer: Supervision/safety,Ambulation,RW,Regular Toilet Toilet Transfer Details (indicate cue type and reason): simulated via functional mobility Toileting- Clothing Manipulation and Hygiene: Supervision/safety,Sit to/from stand Toileting - Clothing Manipulation Details (indicate cue type and reason): completed urination task in standing with RW. good problem solving manuevering of RW in bathroom Functional mobility during ADLs: Supervision/safety,Rolling walker General ADL Comments: Pt with continued deficits in cardiopulmonary tolerance though motivated to return home. Improving balance with use of RW and monitoring of controlled breathing throughout activity. Provided energy conservation handout and reinforced strategies to use at home. Discussed  progression of IADL tasks once BADLs mastered     Mobility   Overal bed mobility: Modified Independent Bed Mobility: Supine to Sit Rolling: Min guard Sidelying to sit: Min assist Supine to sit: Min assist Sit to supine: Modified independent (Device/Increase time),HOB elevated General bed mobility comments: in chair on arrival and end of session       Transfers   Overall transfer level: Modified independent Equipment used: Rolling walker (2 wheeled) Transfers: Sit to/from Stand Sit to Stand: Modified independent (Device/Increase time) Stand pivot transfers: Min guard General transfer comment: sit<>stands from recliner supervision for safety. Pt walked an hour before with nursing     Ambulation / Gait / Stairs / Wheelchair Mobility   Ambulation/Gait Ambulation/Gait assistance: Min guard Gait Distance (Feet): 50 Feet Assistive device: Rolling walker (2 wheeled) Gait Pattern/deviations: Step-through pattern,Decreased stride length General Gait Details: cues for safety, proximity to RW, not talking and walking and energy conservation. Pt able to walk 50' on 8L x 3 trials with 2-3 min seated rest between trial. 1st trial pt talking despite cues and sats 84-88% during gait, 2nd trial no talking with SpO2 90% during gait but drop to 85% at rest with 2 min recovery back to 90%. 3rd trial SpO2 88-92% during gait without talking with 2.5 min recovery once seated due to drop to 84% and recovery to 92% Gait velocity: Decreased Gait velocity interpretation: <1.8 ft/sec, indicate of risk for recurrent falls     Posture / Balance Dynamic Sitting Balance Sitting balance - Comments: sitting on edge of chair unsupported with no UE support and no LOB Balance Overall balance assessment: Needs assistance Sitting-balance support: No upper extremity supported,Feet supported Sitting balance-Leahy Scale: Good Sitting balance - Comments: sitting on edge of chair unsupported with no UE support and no  LOB Standing balance support: Bilateral upper extremity supported Standing balance-Leahy Scale: Fair Standing balance comment: fair static standing, use of B UE support for mobility     Special needs/care consideration Oxygen 5-6L at rest, 6-8L with activity and Diabetic management yes    Previous Home Environment (from acute therapy documentation) Living Arrangements: Spouse/significant other Available Help at Discharge: Family,Available PRN/intermittently Type of Home: House Home Layout: One level Home Access: Stairs to enter,Ramped entrance Entrance Stairs-Rails: None Entrance Stairs-Number of Steps: 2 Bathroom Shower/Tub: Walk-in shower Bathroom Toilet: Standard Bathroom Accessibility: Yes How Accessible: Accessible via walker Home Care Services: No   Discharge Living Setting Plans for Discharge Living Setting: Patient's home,House,Lives with (comment) (Lives with wife) Type of Home at Discharge: House Discharge Home Layout: One level Discharge Home Access: Ramped entrance,Stairs to enter Entrance Stairs-Number of Steps: 2 steps but have ramp at the back of house Discharge Bathroom Shower/Tub: Walk-in shower,Curtain Discharge Bathroom Toilet: Standard Discharge Bathroom Accessibility: Yes How Accessible: Accessible via wheelchair,Accessible via walker Does the patient have any problems obtaining your medications?: No   Social/Family/Support Systems Patient Roles: Spouse (Has a wife.) Contact Information: [name] Lawless - spouse - [phone] Anticipated Caregiver: wife Ability/Limitations of Caregiver: Wife works but is currently on FMLS and is trying to extend FMLA Caregiver Availability: 24/7 (Will have a neighbor to help wife if needed.) Discharge Plan Discussed with Primary Caregiver: Yes Is Caregiver In Agreement with Plan?: Yes Does Caregiver/Family have Issues with Lodging/Transportation while Pt is in Rehab?: No   Goals Patient/Family Goal for Rehab: PT/OT mod I  and supervision goals Expected length of stay: 7-10 days Cultural Considerations: None Pt/Family Agrees to Admission and willing to participate: Yes Program Orientation Provided & Reviewed with Pt/Caregiver Including Roles  & Responsibilities: Yes   Decrease burden of Care through IP rehab admission: N/A   Possible need for SNF placement upon discharge: Not anticipated   Patient Condition: I have reviewed medical records from [facility], spoken with CM, and patient and spouse. I met with patient at the bedside for inpatient rehabilitation   assessment.  Patient will benefit from ongoing PT and OT, can actively participate in 3 hours of therapy a day 5 days of the week, and can make measurable gains during the admission.  Patient will also benefit from the coordinated team approach during an Inpatient Acute Rehabilitation admission.  The patient will receive intensive therapy as well as Rehabilitation physician, nursing, social worker, and care management interventions.  Due to bladder management, bowel management, safety, skin/wound care, disease management, medication administration, pain management and patient education the patient requires 24 hour a day rehabilitation nursing.  The patient is currently min assist to min guard with mobility and basic ADLs with desaturations to 84% on 8L, recovers to 90% within 1-2 minutes standing rest break.  Discharge setting and therapy post discharge at home with home health is anticipated.  Patient has agreed to participate in the Acute Inpatient Rehabilitation Program and will admit today.   Preadmission Screen Completed By: Eugenia Logue, with updates by Caitlin E Warren, 10/02/2020 10:09 AM ______________________________________________________________________   Discussed status with Dr. Martavious Hartel on 10/02/20  at 10:09 AM  and received approval for admission today.   Admission Coordinator:  Eugenia M Logue, RN, time 10/02/20 /Date 10:09 AM      Assessment/Plan: Diagnosis: debility after heart failure, respiratory failure 1. Does the need for close, 24 hr/day Medical supervision in concert with the patient's rehab needs make it unreasonable for this patient to be served in a less intensive setting? Yes 2. Co-Morbidities requiring supervision/potential complications: afib, PAH, GIB 3. Due to bladder management, bowel management, safety, skin/wound care, disease management, medication administration, pain management and patient education, does the patient require 24 hr/day rehab nursing? Yes 4. Does the patient require coordinated care of a physician, rehab nurse, PT, OT, and SLP to address physical and functional deficits in the context of the above medical diagnosis(es)? Yes Addressing deficits in the following areas: balance, endurance, locomotion, strength, transferring, bowel/bladder control, bathing, dressing, feeding, grooming, toileting and psychosocial support 5. Can the patient actively participate in an intensive therapy program of at least 3 hrs of therapy 5 days a week? Yes 6. The potential for patient to make measurable gains while on inpatient rehab is excellent 7. Anticipated functional outcomes upon discharge from inpatient rehab: modified independent and supervision PT, modified independent and supervision OT, n/a SLP 8. Estimated rehab length of stay to reach the above functional goals is: 7-10 days 9. Anticipated discharge destination: Home 10. Overall Rehab/Functional Prognosis: excellent     MD Signature: Zarek Relph T. Belkis Norbeck, MD, FAAPMR [facility] 10/02/2020   

## 2020-09-13 NOTE — Progress Notes (Signed)
Family Medicine Teaching Service Daily Progress Note Intern Pager: [phone]  Patient name: Gary [name] Medical record number: [id number] Date of birth: 10/17/1950 Age: 70 y.o. Gender: male  Primary Care Provider: Jalene Mullet, PA-C Consultants: Cardiology (advanced heart failure), GI   Code Status: Full Code  Pt Overview and Major Events to Date:  4/18 admitted 4/19 right/left heart cath with concern for pulmonary HTN 4/22 2u pRBC, intubated, EGD (nonbleeding duodenal ulcers), transferred to ICU 4/26 extubated, 1u pRBC 4/28 FPTS resumed care  Assessment and Plan: Gary [name] is a 70 y.o. 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 PMHx significant for: HTN, T2DM, HLD, depression.  Acute hypoxemic respiratory failure secondary to [facility] with cor pulmonale Extubated on 4/26 in the ICU, breathing comfortably on high flow nasal cannula currently. Completed 5 days of CTX yesterday for presumed PNA with positive S. pneumo on sputum cultures. - cardiology advanced heart failure following, appreciate recommendations - plan to stop milrinone if CO-OX stable per cardiology - sildenafil 40 mg TID - continue LABA/LAMA/ICS - s/p CTX (4/23-4/28) - PFTs and PSG outpatient  Upper GI bleed Non-bleeding duodenal ulcers seen on EGD. Hgb stable at 8.7. - holding AC - switch to PO PPI BID now extubated  pAF Back in A Fib, rate currently controlled on amiodarone gtt. - continue amiodarone gtt - holding AC for 1-2 weeks per cardiology  Hypotension Off pressors now. BP remains soft in the 94B systolic. Home amlodipine and enalapril-HCTZ on hold. - midodrine 10 mg TID  T2DM Most recent A1c 6.7. On glipizide 10 mg at home. Was on 20 units Lantus and required an additional 5 units. Will space out SSI and CBG checks now that floor status. 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  FEN/GI: DYS 3 PPx: SCDs  Disposition: progressive, likely CIR when stable  Subjective:  NAOE. Denies dyspnea on HFNC. Denies any bleeding.  Objective: Temp:  [97.7 F (36.5 C)-99.14 F (37.3 C)] 97.7 F (36.5 C) (04/28 0645) Pulse Rate:  [71-122] 100 (04/28 0600) Resp:  [15-28] 18 (04/28 0600) BP: (89-137)/(54-76) 91/69 (04/28 0600) SpO2:  [87 %-97 %] 95 % (04/28 0600) Arterial Line BP: (117-147)/(43-56) 122/43 (04/27 1500) FiO2 (%):  [100 %] 100 % (04/28 0515) Weight:  [84.9 kg] 84.9 kg (04/28 0600) Physical Exam: General: Elderly male sitting up in bed comfortably, NAD Cardiovascular: Irregularly irregular, normal rate, no murmurs Respiratory: faint bibasilar rales, breathing comfortably on HFNC Abdomen: soft, non-tender, +BS Extremities: WWP, no edema  Laboratory: Recent Labs  Lab 09/11/20 0344 09/11/20 0929 09/11/20 2314 09/12/20 0348 09/13/20 0447  WBC 8.2  --   --  6.6 10.1  HGB 7.2*   < > 9.2* 9.3* 8.7*  HCT 22.7*   < > 27.0* 29.7* 28.0*  PLT 227  --   --  229 273   < > = values in this interval not displayed.   Recent Labs  Lab 09/11/20 0344 09/11/20 0929 09/11/20 2314 09/12/20 0348 09/13/20 0447  NA 139   < > 142 142 140  K 3.6   < > 4.7 4.7 4.1  CL 100  --   --  103 98  CO2 31  --   --  31 33*  BUN 28*  --   --  26* 25*  CREATININE 0.76  --   --  0.87 0.80  CALCIUM 7.6*  --   --  8.0* 8.1*  GLUCOSE 201*  --   --  206* 122*   < > = values in this interval not displayed.      Imaging/Diagnostic Tests: No new imaging.  [name], MD 09/13/2020, 7:25 AM PGY-1, [place] Intern pager: [phone], text pages welcome

## 2020-09-13 NOTE — Plan of Care (Signed)

## 2020-09-13 NOTE — Progress Notes (Signed)
FPTS Interim Progress Note  S:  Dr. Larae Grooms and I went to visit patient at bedside. He was resting comfortably, satting well at 94% on 25L of HFNC. He reports that he feels much better, is comfortable, and was particularly happy that he was able to eat "real food" today and tolerated it well. He is presently rate controlled (HR 80s) with no other complaints.  O: BP 92/64 (BP Location: Right Arm)   Pulse 90   Temp 98.4 F (36.9 C) (Oral)   Resp 20   Ht 5' 7"  (1.702 m)   Wt 84.9 kg   SpO2 90%   BMI 29.32 kg/m   Gen: elderly, WM, NAD, resting comfortably in bed Cardiac: irregularly irregular rhythm, regular rate, no m/r/g, 2+ radial pulses Pulm: CTAB, no increased WOB, no w/r/r Neuro: AOx3 Psych: pleasant and appropriate  A/P: 70 yo man being treated for a. Fib, cor pulmonale, pulmonary hypertension, and GI bleed who is overall much improved. Continue current care.  Gladys Damme, MD 09/13/2020, 11:49 PM PGY-2, [place] Medicine Service pager [phone]

## 2020-09-13 NOTE — Evaluation (Signed)
Occupational Therapy Evaluation Patient Details Name: Gary [name] MRN: [id number] DOB: 1951-04-15 Today's Date: 09/13/2020    History of Present Illness Patient is a 71 y/o male who presents on 09/03/20 with progressive SOB. Found to have acute respiratory failure likely due to new onset of heart failure and A-fib with RVR. CXR- Rt effusion, prominent interstitial edema/disease. s/p cardiac cath 09/04/20. 4/21 hypotension, 4/22 drop in Hgb, GIB with intubation and urgent bedside EGD on 4/22 with PT sign off.  Pt was extubated on 4/26. OT/PT reordered on 4/27.  PMhx: HTN and DM.   Clinical Impression   Patient with decline in status compared to previous OT assessment.  See above for details.  PTA he lived with his spouse, was independent with all care and mobility, drove and cared for 10 dogs.  Barriers are listed below.  Currently, he is needing up to Max A for self care, Mod A of up to two for basic transfers.  CIR has been recommended, and given his prior level of function, motivation, and participation, a higher level of rehab would be appropriate.  OT will follow in the acute setting to maximize his functional status.      Follow Up Recommendations  CIR    Equipment Recommendations  3 in 1 bedside commode;Tub/shower bench    Recommendations for Other Services Rehab consult     Precautions / Restrictions Precautions Precautions: Fall Precaution Comments: watch 02 (on high flow), HR Restrictions Weight Bearing Restrictions: No      Mobility Bed Mobility Overal bed mobility: Needs Assistance             General bed mobility comments: up in recliner Patient Response: Cooperative  Transfers Overall transfer level: Needs assistance   Transfers: Sit to/from Stand Sit to Stand: Mod assist;+2 physical assistance         General transfer comment: unable to achieve full stand due to R knee discomfort.    Balance Overall balance assessment: Needs assistance Sitting-balance  support: No upper extremity supported;Feet supported Sitting balance-Leahy Scale: Fair     Standing balance support: Bilateral upper extremity supported;During functional activity Standing balance-Leahy Scale: Poor Standing balance comment: relies on UE support for balance                           ADL either performed or assessed with clinical judgement   ADL Overall ADL's : Needs assistance/impaired Eating/Feeding: Set up;Sitting   Grooming: Wash/dry hands;Wash/dry face;Sitting;Minimal assistance   Upper Body Bathing: Moderate assistance;Sitting   Lower Body Bathing: Maximal assistance;Bed level   Upper Body Dressing : Moderate assistance;Sitting   Lower Body Dressing: Maximal assistance;Bed level               Functional mobility during ADLs: Moderate assistance;+2 for physical assistance;+2 for safety/equipment       Vision Baseline Vision/History: Wears glasses Wears Glasses: At all times Patient Visual Report: No change from baseline       Perception     Praxis      Pertinent Vitals/Pain Pain Assessment: Faces Faces Pain Scale: Hurts a little bit Pain Location: R knee Pain Descriptors / Indicators: Aching;Tender Pain Intervention(s): Monitored during session     Hand Dominance Right   Extremity/Trunk Assessment Upper Extremity Assessment Upper Extremity Assessment: Generalized weakness   Lower Extremity Assessment Lower Extremity Assessment: Defer to PT evaluation   Cervical / Trunk Assessment Cervical / Trunk Assessment: Normal   Communication Communication Communication: No difficulties  Cognition Arousal/Alertness: Awake/alert Behavior During Therapy: WFL for tasks assessed/performed Overall Cognitive Status: Within Functional Limits for tasks assessed                                                      Home Living Family/patient expects to be discharged to:: Private residence Living Arrangements:  Spouse/significant other Available Help at Discharge: Family;Available PRN/intermittently Type of Home: House Home Access: Stairs to enter;Ramped entrance   Entrance Stairs-Rails: None Home Layout: One level     Bathroom Shower/Tub: Occupational psychologist: Standard Bathroom Accessibility: Yes How Accessible: Accessible via walker Home Equipment: Shower seat          Prior Functioning/Environment Level of Independence: Independent        Comments: Per Chart: Patient reports independent with ADLs, IADLs, cooking/cleaning. Cares for 10 dogs at home. Retired [employer] administrator.        OT Problem List: Decreased activity tolerance;Impaired balance (sitting and/or standing);Decreased strength;Decreased range of motion;Decreased knowledge of use of DME or AE;Cardiopulmonary status limiting activity;Impaired UE functional use;Pain      OT Treatment/Interventions: Self-care/ADL training;Therapeutic exercise;Energy conservation;DME and/or AE instruction;Balance training;Therapeutic activities    OT Goals(Current goals can be found in the care plan section) Acute Rehab OT Goals Patient Stated Goal: get stronger and care for my dogs OT Goal Formulation: With patient Time For Goal Achievement: 09/27/20 Potential to Achieve Goals: Good ADL Goals Pt Will Perform Grooming: with set-up;sitting;standing Pt Will Perform Upper Body Bathing: with set-up;sitting Pt Will Perform Lower Body Bathing: with min assist;sit to/from stand Pt Will Perform Upper Body Dressing: with set-up;sitting Pt Will Perform Lower Body Dressing: with min assist;with adaptive equipment;sit to/from stand Pt Will Transfer to Toilet: with min guard assist;stand pivot transfer;bedside commode Pt Will Perform Toileting - Clothing Manipulation and hygiene: with min guard assist;sit to/from stand  OT Frequency: Min 2X/week   Barriers to D/C:    none noted       Co-evaluation              AM-PAC OT "6  Clicks" Daily Activity     Outcome Measure Help from another person eating meals?: A Little Help from another person taking care of personal grooming?: A Little Help from another person toileting, which includes using toliet, bedpan, or urinal?: A Lot Help from another person bathing (including washing, rinsing, drying)?: A Lot Help from another person to put on and taking off regular upper body clothing?: A Little Help from another person to put on and taking off regular lower body clothing?: A Lot 6 Click Score: 15   End of Session Equipment Utilized During Treatment: Oxygen;Rolling walker Nurse Communication: Mobility status  Activity Tolerance: Patient limited by fatigue Patient left: with call bell/phone within reach;with family/visitor present;in chair  OT Visit Diagnosis: Unsteadiness on feet (R26.81);Muscle weakness (generalized) (M62.81);Pain Pain - Right/Left: Right Pain - part of body: Knee                Time: 1358-1415 OT Time Calculation (min): 17 min Charges:  OT General Charges $OT Visit: 1 Visit OT Evaluation $OT Eval Moderate Complexity: 1 Mod  09/13/2020  Rich, OTR/L  Acute Rehabilitation Services  Office:  [phone]   Metta Clines 09/13/2020, 4:11 PM

## 2020-09-13 NOTE — Progress Notes (Signed)
   09/13/20 2357  BiPAP/CPAP/SIPAP  $ Non-Invasive Ventilator  Non-Invasive Vent Set Up  $ Non-Invasive Home Ventilator  Initial  BiPAP/CPAP/SIPAP Pt Type Adult  Mask Type Full face mask  Mask Size Large  Set Rate 12 breaths/min  Respiratory Rate 26 breaths/min  IPAP 12 cmH20  EPAP 6 cmH2O  Oxygen Percent 80 %  Minute Ventilation 21  Leak 35  Peak Inspiratory Pressure (PIP) 13  Tidal Volume (Vt) 916  BiPAP/CPAP/SIPAP BiPAP  Patient Home Equipment No  Auto Titrate No  Press High Alarm 25 cmH2O  Press Low Alarm 5 cmH2O  BiPAP/CPAP /SiPAP Vitals  Pulse Rate 74  Resp (!) 25  SpO2 96 %  MEWS Score/Color  MEWS Score 2  MEWS Score Color Yellow  Placed pt. On bipap v60 due to increased WOB, shortness of breath.pt. Was on HHFNC 100% 40L sats. Below 92% pt. Tolerating above settings.

## 2020-09-13 NOTE — Progress Notes (Signed)
Nutrition Follow-up  DOCUMENTATION CODES:   Not applicable  INTERVENTION:   Ensure Enlive po BID, each supplement provides 350 kcal and 20 grams of protein  NUTRITION DIAGNOSIS:   Inadequate oral intake related to inability to eat as evidenced by NPO status.  Being addressed as diet advanced, supplements  GOAL:   Patient will meet greater than or equal to 90% of their needs  Progressing  MONITOR:   Vent status,Labs,Weight trends,Skin,I & O's  REASON FOR ASSESSMENT:   Ventilator    ASSESSMENT:   70 year old with history of hypertension, dyslipidemia, and type 2 diabetes presenting with acute hypoxemic respiratory failure likely due to new heart failure and atrial fibrillation with rapid ventricular rates.    4/22 EGD: no active bleeding, large duodenal ulcer, erosive gastritis;  Intubated 4/24 TF Protocol initiated 4/26 Extubated  Diet advanced to Dysphagia 3  Current wt 84.9 kg; weight relatively stable  Labs: reviewed Meds: ss novolog, lantus, miralax   Diet Order:   Diet Order            DIET DYS 3 Room service appropriate? Yes; Fluid consistency: Thin  Diet effective now                 EDUCATION NEEDS:   No education needs have been identified at this time  Skin:  Skin Assessment: Reviewed RN Assessment  Last BM:  4/24  Height:   Ht Readings from Last 1 Encounters:  09/03/20 5' 7"  (1.702 m)    Weight:   Wt Readings from Last 1 Encounters:  09/13/20 84.9 kg    Ideal Body Weight:  67.3 kg  BMI:  Body mass index is 29.32 kg/m.  Estimated Nutritional Needs:   Kcal:  2000-2200 kcals  Protein:  100-115 g  Fluid:  > 2 L   Kerman Passey MS, RDN, LDN, CNSC Registered Dietitian III Clinical Nutrition RD Pager and On-Call Pager Number Located in [place]

## 2020-09-13 NOTE — Progress Notes (Addendum)
Patient ID: Gary [name], male   DOB: April 06, 1951, 70 y.o.   MRN: [id number]     Advanced Heart Failure Rounding Note  PCP-Cardiologist: Elouise Munroe, MD   Subjective:   Events  4/22  EGD 4/22 showed 2 ulcers in duodenum that were not actively bleeding.  He remains on Protonix gtt, no overt bleeding today.  Hgb 10.8    4/23 Intubated. Dobutamine stopped due to marked tachycardia. Started antibiotics for pneumonia. Blood CX- NGTD. Sputum with strep.  4/24 Did not tolerate fast veletri wean.  4/25 Veletri stopped. Extubated. Give 1u PRBC   Hgb 7.5>9.3>8.7    On milrinone 0.125 mcg. CO-OX 83% .   Back in A fib today. Remains on amio 30 mg pe rhour.   Remains on HFNC 30   Remains on HFNC. Feels ok today.  Objective:   Weight Range: 84.9 kg Body mass index is 29.32 kg/m.   Vital Signs:   Temp:  [97.7 F (36.5 C)-99.14 F (37.3 C)] 97.7 F (36.5 C) (04/28 0645) Pulse Rate:  [71-122] 100 (04/28 0600) Resp:  [15-28] 18 (04/28 0600) BP: (89-137)/(54-76) 91/69 (04/28 0600) SpO2:  [87 %-97 %] 95 % (04/28 0600) Arterial Line BP: (117-145)/(43-56) 122/43 (04/27 1500) FiO2 (%):  [100 %] 100 % (04/28 0515) Weight:  [84.9 kg] 84.9 kg (04/28 0600) Last BM Date: 09/09/20  Weight change: Filed Weights   09/11/20 0440 09/12/20 0500 09/13/20 0600  Weight: 84.5 kg 84.6 kg 84.9 kg    Intake/Output:   Intake/Output Summary (Last 24 hours) at 09/13/2020 0738 Last data filed at 09/13/2020 0400 Gross per 24 hour  Intake 1001.86 ml  Output 1040 ml  Net -38.14 ml      Physical Exam  CVP 1-2  General:  Sitting in the chair.  No resp difficulty HEENT: normal Neck: supple. no JVD. Carotids 2+ bilat; no bruits. No lymphadenopathy or thryomegaly appreciated. Cor: PMI nondisplaced. Irregular rate & rhythm. No rubs, gallops or murmurs. Lungs: Coarse throughout Abdomen: soft, nontender, nondistended. No hepatosplenomegaly. No bruits or masses. Good bowel sounds. Extremities: no  cyanosis, clubbing, rash, edema Neuro: alert & orientedx3, cranial nerves grossly intact. moves all 4 extremities w/o difficulty. Affect pleasant   Telemetry   A fib 100s   Labs    CBC Recent Labs    09/12/20 0348 09/13/20 0447  WBC 6.6 10.1  HGB 9.3* 8.7*  HCT 29.7* 28.0*  MCV 97.4 98.2  PLT 229 875   Basic Metabolic Panel Recent Labs    09/12/20 0348 09/13/20 0447  NA 142 140  K 4.7 4.1  CL 103 98  CO2 31 33*  GLUCOSE 206* 122*  BUN 26* 25*  CREATININE 0.87 0.80  CALCIUM 8.0* 8.1*   Liver Function Tests No results for input(s): AST, ALT, ALKPHOS, BILITOT, PROT, ALBUMIN in the last 72 hours. No results for input(s): LIPASE, AMYLASE in the last 72 hours. Cardiac Enzymes No results for input(s): CKTOTAL, CKMB, CKMBINDEX, TROPONINI in the last 72 hours.  BNP: BNP (last 3 results) Recent Labs    09/03/20 1433  BNP 1,202.7*    ProBNP (last 3 results) No results for input(s): PROBNP in the last 8760 hours.   D-Dimer No results for input(s): DDIMER in the last 72 hours. Hemoglobin A1C No results for input(s): HGBA1C in the last 72 hours. Fasting Lipid Panel Recent Labs    09/11/20 0344  TRIG 61   Thyroid Function Tests No results for input(s): TSH, T4TOTAL, T3FREE, THYROIDAB in the  last 72 hours.  Invalid input(s): FREET3  Other results:   Imaging    No results found.   Medications:     Scheduled Medications: . sodium chloride   Intravenous Once  . arformoterol  15 mcg Nebulization BID  . atorvastatin  40 mg Per Tube Daily  . budesonide  0.5 mg Nebulization BID  . chlorhexidine  15 mL Mouth Rinse BID  . Chlorhexidine Gluconate Cloth  6 each Topical Daily  . insulin aspart  0-24 Units Subcutaneous Q4H  . insulin glargine  20 Units Subcutaneous Daily  . mouth rinse  15 mL Mouth Rinse BID  . mouth rinse  15 mL Mouth Rinse q12n4p  . midodrine  10 mg Per Tube TID  . pantoprazole  40 mg Intravenous Q12H  . revefenacin  175 mcg  Nebulization Daily  . sildenafil  40 mg Oral TID  . sodium chloride flush  10-40 mL Intracatheter Q12H    Infusions: . sodium chloride Stopped (09/10/20 1336)  . sodium chloride 10 mL/hr at 09/13/20 0400  . amiodarone 30 mg/hr (09/13/20 0400)  . milrinone 0.125 mcg/kg/min (09/13/20 0400)  . norepinephrine (LEVOPHED) Adult infusion Stopped (09/11/20 1730)  . propofol (DIPRIVAN) infusion Stopped (09/11/20 1322)    PRN Medications: sodium chloride, acetaminophen, ondansetron (ZOFRAN) IV, sodium chloride flush    Assessment/Plan    1. Acute upper GI bleed with symptomatic anemia - hgb down to 6.7 + melena on 4/22, had 2 units PRBCs.  Hgb stable overnight 8.2>7.2 -> 7.2->9.3 >8.7   - Eliquis off - CT negative for RP bleed - On Protonix gtt - EGD with 2 ulcers not acutely bleeding.  - No overt bleeding overnight.   2. PAH with cor pulmonale - CT chest 4/22: No PE or ILD - Echo LVEF 60% severe RV dilation and HK - Cath with moderate PAH. PA = 66/31 (46) PCW = 13 Fick = 4.1/2.1 PVR = 7.8 WU - Suspect this is WHO Group 3 PAH due to hypoxic lung disease/OHS/OSA - Will order PFTs (when better compensated) and will need outpatient sleep study - Check serologies for completeness sake>> in process  - Will need home O2.  .  Off pressors. Stable on milrinone . CO-OX stable.  - Off veletri  - On sildenafil 40 mg tid.   3. Paroxysmal AF - new onset. Unclear duration  - Eliquis on hold d/t GI bleed - Back in A fib earlier this morning. Continue amio drip.  - No AC with recent large GI bleed - Continue SCDs.    4. CAD  -mostly non-obstructive - atorva 40 added  - No ASA with AC - No chest pain.   5. R lung mass - will need CT f/u   6. Shock - Suspect mixed hemorrhagic/cardiogenic (RV failure).  May have septic/distributive component with fever.   -Off vasopressin/norepi.  -CO-OX 82%. Stop milrinone.    6. ID - Afebrile today.  PCT 0.7    - Bld CX - NGTD - Sputum - with  few strep pneumonia   - He is empirically on ceftriaxone for ?PNA.   7. Acute hypoxemic respiratory failure - Intubated pre-EGD on 4/22.   - Extubated--> on HFNC - CCM appreciated.   Repeat CO-OX if stable will stop milrinone. Continue amio drip for now.    Length of Stay: [place], NP  09/13/2020, 7:38 AM  Advanced Heart Failure Team Pager [phone] (M-F; 7a - 5p)  Please contact [facility] for night-coverage  after hours (5p -7a ) and weekends on [email or web address]  Patient seen and examined with the above-signed Advanced Practice Provider and/or Housestaff. I personally reviewed laboratory data, imaging studies and relevant notes. I independently examined the patient and formulated the important aspects of the plan. I have edited the note to reflect any of my changes or salient points. I have personally discussed the plan with the patient and/or family.   Remains on milrinone and high flow O2 at 100% sats high 80s low 90s. Back in AF despite IV amio. CVP ok. Hgb stable. Off AC  General:  Sitting in chair on high flow O2 HEENT: normal Neck: supple. no JVD. Carotids 2+ bilat; no bruits. No lymphadenopathy or thryomegaly appreciated. Cor: PMI nondisplaced.Irreg tachy. No rubs, gallops or murmurs. Lungs: coarse Abdomen:  Obese soft, nontender, nondistended. No hepatosplenomegaly. No bruits or masses. Good bowel sounds. Extremities: no cyanosis, clubbing, rash, edema Neuro: alert & orientedx3, cranial nerves grossly intact. moves all 4 extremities w/o difficulty. Affect pleasant   Remains on low-dose milrinone.    O2 requirements far out of proportion to HF or PH. Suggests significant intrinsic lung disease or restrictive physiology. Will stop milrinone. Continue midodrine for BP support for now.   Back in AF. Continue IV amio. Can bolus today. Restart po diuretics tomorrow. Will need to mobilize. Will plan to resume Eliquis in next week or two.   Glori Bickers, MD  9:28  AM

## 2020-09-14 ENCOUNTER — Inpatient Hospital Stay (HOSPITAL_COMMUNITY): Payer: Medicare Other

## 2020-09-14 DIAGNOSIS — R0902 Hypoxemia: Secondary | ICD-10-CM | POA: Diagnosis not present

## 2020-09-14 DIAGNOSIS — J9601 Acute respiratory failure with hypoxia: Secondary | ICD-10-CM | POA: Diagnosis not present

## 2020-09-14 LAB — GLUCOSE, CAPILLARY
Glucose-Capillary: 102 mg/dL — ABNORMAL HIGH (ref 70–99)
Glucose-Capillary: 144 mg/dL — ABNORMAL HIGH (ref 70–99)
Glucose-Capillary: 140 mg/dL — ABNORMAL HIGH (ref 70–99)
Glucose-Capillary: 166 mg/dL — ABNORMAL HIGH (ref 70–99)

## 2020-09-14 LAB — BASIC METABOLIC PANEL
Anion gap: 7 (ref 5–15)
BUN: 26 mg/dL — ABNORMAL HIGH (ref 8–23)
CO2: 32 mmol/L (ref 22–32)
Calcium: 7.8 mg/dL — ABNORMAL LOW (ref 8.9–10.3)
Chloride: 95 mmol/L — ABNORMAL LOW (ref 98–111)
Creatinine, Ser: 0.85 mg/dL (ref 0.61–1.24)
GFR, Estimated: >60 mL/min (ref >60)
Glucose, Bld: 105 mg/dL — ABNORMAL HIGH (ref 70–99)
Potassium: 3.7 mmol/L (ref 3.5–5.1)
Sodium: 134 mmol/L — ABNORMAL LOW (ref 135–145)

## 2020-09-14 LAB — CBC
HCT: 29.8 % — ABNORMAL LOW (ref 39.0–52.0)
Hemoglobin: 9.3 g/dL — ABNORMAL LOW (ref 13.0–17.0)
MCH: 30.1 pg (ref 26.0–34.0)
MCHC: 31.2 g/dL (ref 30.0–36.0)
MCV: 96.4 fL (ref 80.0–100.0)
Platelets: 289 10*3/uL (ref 150–400)
RBC: 3.09 MIL/uL — ABNORMAL LOW (ref 4.22–5.81)
RDW: 14.6 % (ref 11.5–15.5)
WBC: 8.6 10*3/uL (ref 4.0–10.5)
nRBC: 0.8 % — ABNORMAL HIGH (ref 0.0–0.2)

## 2020-09-14 LAB — COOXEMETRY PANEL
Carboxyhemoglobin: 1 % (ref 0.5–1.5)
Methemoglobin: 1 % (ref 0.0–1.5)
O2 Saturation: 62.5 %
Total hemoglobin: 9.1 g/dL — ABNORMAL LOW (ref 12.0–16.0)

## 2020-09-14 LAB — MAGNESIUM: Magnesium: 1.4 mg/dL — ABNORMAL LOW (ref 1.7–2.4)

## 2020-09-14 MED ORDER — AMIODARONE LOAD VIA INFUSION
150.0000 mg | Freq: Once | INTRAVENOUS | Status: AC
  Administered 2020-09-14: 150 mg via INTRAVENOUS
  Filled 2020-09-14: qty 83.34

## 2020-09-14 MED ORDER — SALINE SPRAY 0.65 % NA SOLN
1.0000 | Freq: Once | NASAL | Status: AC
  Administered 2020-09-14: 1 via NASAL
  Filled 2020-09-14: qty 44

## 2020-09-14 MED ORDER — TORSEMIDE 20 MG PO TABS
40.0000 mg | ORAL_TABLET | Freq: Every day | ORAL | Status: DC
  Administered 2020-09-14 – 2020-09-15 (×2): 40 mg via ORAL
  Filled 2020-09-14 (×3): qty 2

## 2020-09-14 MED ORDER — IOHEXOL 350 MG/ML SOLN
100.0000 mL | Freq: Once | INTRAVENOUS | Status: AC | PRN
  Administered 2020-09-14: 100 mL via INTRAVENOUS

## 2020-09-14 NOTE — Progress Notes (Signed)
NAME:  Gary [name], MRN:  [id number], DOB:  [birth year], LOS: 10 ADMISSION DATE:  09/03/2020, CONSULTATION DATE:  09/07/2020 REFERRING MD:  Nori Riis, CHIEF COMPLAINT:  GI Bleed   History of Present Illness:  70 y/o M who presented to [facility] on 4/18 with complaints of shortness of breath. 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 Has had progressive SOB for a few months.  Admitted to [facility] 4/18 for further workup and tx, with rapidly increasing O2 requirement. R/LHC performed 4/19 revealing PAH, 100% stenosis RCA and non-obstructive lesions. Had ongoing high O2 demand, on HHFNC. His course has been c/b GIB requiring PRBC. Moved to ICU and intubated for hypoxic respiratory failure 4/22. GI consulted and performed EGD 4/22 finding short segment Barrett's esophagus, small hiatal hernia, erosive gastritis. Managed in ICU for Shock -- hemorrhagic + cardiogenic shock with cor pulmonale. Patient able to extubate 4/26 and transferred out of the ICU 4/27 back to [facility], remaining on [facility].   4/29 PCCM reconsulted for pulmonary recommendations regarding ongoing high O2 need Pertinent  Medical History  Former smoker HTN DM Traumatic crush injury from a tree branch with multiple fractures Epistaxis requiring packing   Significant Hospital Events: Including procedures, antibiotic start and stop dates in addition to other pertinent events    4/18 admitted with SOB, echo with elevated right heart pressures, new finding of A. fib  4/19 R/L heart cath with concern for pulmonary hypertension  4/20 on 15L HFNC  4/21 hypotensive episode, responded to IVF, down to 5L O2  4/22 ABLA, to ICU, intubated, severe hypoxemia; EGD with nonbleeding duodenal ulcer  4/23 failed veletri wean  4/26 veletri weaned and pt extubated  4/27 transferred Out of ICU  4/29 PCCM re-consulted for recommendations regarding ongoing hypoxia and high oxygen demand. CCM ordering CXR, CTA chest      Interim  History / Subjective:  States he actually feels better.  Currently on 30 L HFNC  Objective   Blood pressure (!) 100/57, pulse 69, temperature 98.6 F (37 C), temperature source Oral, resp. rate 17, height 5' 7"  (1.702 m), weight 87.3 kg, SpO2 97 %. CVP:  [2 mmHg-9 mmHg] 5 mmHg  FiO2 (%):  [89 %-100 %] 100 %   Intake/Output Summary (Last 24 hours) at 09/14/2020 1418 Last data filed at 09/14/2020 1315 Gross per 24 hour  Intake 572.29 ml  Output 1075 ml  Net -502.71 ml   Filed Weights   09/12/20 0500 09/13/20 0600 09/14/20 0524  Weight: 84.6 kg 84.9 kg 87.3 kg    Examination: Constitutional:elderly male, sitting in chair in NAD Eyes: pupils equal, reactive to light Ears, nose, mouth, and throat: wnl Cardiovascular: sinus rhythm, regular rate, warm extremities. Trace  lower extremity edema, no  upper extremity edema. Respiratory: Bilateral chest excursion, coarse breath sounds throughout R>L, diminished per bases, mild accessory muscle use Gastrointestinal: Soft. Bowel sounds present Skin: Warm, no rashes or lesions Neurologic: Alert. Oriented to person, place and year,    Labs/imaging that I havepersonally reviewed  (right click and "Reselect all SmartList Selections" daily)  4/29 CXR is pending>  4/29 CTA chest>  4/29 BLE dopplers>  4/28 coox 82.8 > 49.6 4/29 BMP grossly normal, CBC with hgb 9.3   Is net negative 5 L this admission  HGB 9.3, WBC 8.6, T Max 99.9 Last Mag 1.7, will recheck  Resolved Hospital Problem list     Assessment & Plan:  Acute hypoxemic respiratory failure, suspect multifactorial - tx  for possible PNA w- ceftriaxone 4/23- 4/27 - probably some atelectasis per CXR 4/29 -is net negative this admission, but R heart failure possibly contributing -hasn't had a CXR since 4/23, and has been off anticoag due to GIB   P -CXR  - Mag - Co-ox - Wean oxygen for sats of 88-92% -CTA chest ( No anticoag 2/2 GIB) -IS, mobility, PT/ OT -Scheduled BDs, ICS  nebs as ordered - Will consider using decadron/ steroids once CTA results - Check Mag and replete as needed ( Goal > 2) - Aggressive ncentive spirometry and mobilization. PT/OT consulted  - If CTA Negative, consider BLE dopplers    Pulmonary HTN, cor pulmonale, likely secondary to OSA, COPD  Afib not on AC due to recent GIB ABLA thought secondary to duodenal ulcer Hgb improving  Abnormal CT question of RML mass needs OP f/u; less impressed by this P - Outpatient workup for pHTN - PFTs, sleep study - Bronchodilator therapy (aformoterol and revefenacin nebs), inhaled corticosteroid (budesonide) -  Continue sildenafil.  - Maintain net negative status -  Continue amiodarone for Afib. No AC in setting of recent GI bleed. -  f/u duodenal biopsy, pantoprazole Q12HR   Best practice (right click and "Reselect all SmartList Selections" daily)  Diet: TF Pain/Anxiety/Delirium protocol (if indicated): No VAP protocol (if indicated): Not indicated DVT prophylaxis: PAS hose GI prophylaxis: PPI Glucose control:  lantus/SSI Central venous access:  No Arterial line: yep Foley:  yep Mobility:  bed rest   PT consulted:Yes Last date of multidisciplinary goals of care discussion: pending Code Status:  full code Disposition: ICU  Magdalen Spatz, MSN, AGACNP-BC [place] for personal pager PCCM on call pager [phone] 09/14/2020 2:34 PM

## 2020-09-14 NOTE — Progress Notes (Addendum)
Family Medicine Teaching Service Daily Progress Note Intern Pager: [phone]  Patient name: Gary [name] Medical record number: [id number] Date of birth: 15-Jul-1950 Age: 70 y.o. Gender: male  Primary Care Provider: Jalene Mullet, PA-C Consultants: Cardiology (advanced heart failure), pulmonology, GI   Code Status: Full Code  Pt Overview and Major Events to Date:  4/18 admitted 4/19 right/left heart cath with concern for pulmonary HTN 4/22 2u pRBC, intubated, EGD (nonbleeding duodenal ulcers), transferred to ICU 4/26 extubated, 1u pRBC 4/28 FPTS resumed care  Assessment and Plan: Gary [name] is a 70 y.o. 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 PMHx significant for: HTN, T2DM, HLD, depression.  Acute hypoxemic respiratory failure secondary to [facility] with cor pulmonale Placed on BiPAP for a few hours overnight for increased WOB, now breathing comfortably on HFNC but still with high O2 requirement of 40L this AM. Possible ILD component. - cardiology advanced heart failure following, appreciate recommendations - consulted pulmonology, appreciate recommendations - avoid BiPAP if possible given history of significant epistaxis - sildenafil 40 mg TID - continue LABA/LAMA/ICS - s/p CTX (4/23-4/28) - PFTs and PSG outpatient  Upper GI bleed Non-bleeding duodenal ulcers seen on EGD. Hgb stable at 9.3. - holding AC - PO PPI BID  H. Pylori Positive H. pylori on EGD biopsy. - management per GI  pAF In A Fib, rate currently controlled on amiodarone gtt. - continue amiodarone gtt - holding AC for 1-2 weeks per cardiology  Hypotension BP remains soft in the 50V-697X systolic. Home amlodipine and enalapril-HCTZ on hold. - midodrine 10 mg TID  T2DM Most recent A1c 6.7. On glipizide 10 mg at home. Adequately controlled, received 20 units Lantus yesterday. Fasting CBG 102. - Lantus 20 units daily - sSSI with meals - monitor CBG 4 times  daily  CAD - continue statin - hold ASA given recent GI bleed  Right lung mass Noted on CTA, will need repeat CT in 1 month.  FEN/GI: DYS 3 PPx: SCDs  Disposition: progressive, likely CIR when stable  Subjective:  Last night, patient was placed on BiPAP for a few hours given increased work of breathing.  Now back on high flow nasal cannula.  Patient denies any shortness of breath on HFNC currently.  Plan to get out of bed more today.  No issues voiding.  Objective: Temp:  [97.7 F (36.5 C)-99.9 F (37.7 C)] 98.9 F (37.2 C) (04/29 0402) Pulse Rate:  [74-111] 92 (04/29 0407) Resp:  [15-26] 23 (04/29 0407) BP: (82-114)/(54-73) 95/64 (04/29 0402) SpO2:  [86 %-98 %] 92 % (04/29 0407) FiO2 (%):  [89 %-100 %] 100 % (04/29 0407) Weight:  [87.3 kg] 87.3 kg (04/29 0524) Physical Exam: General: Elderly male sitting up in bed comfortably, NAD Cardiovascular: Irregularly irregular, normal rate, no murmurs Respiratory: faint bibasilar rales, breathing comfortably on HFNC Abdomen: soft, non-tender, +BS Extremities: WWP, no edema  Laboratory: Recent Labs  Lab 09/12/20 0348 09/13/20 0447 09/14/20 0431  WBC 6.6 10.1 8.6  HGB 9.3* 8.7* 9.3*  HCT 29.7* 28.0* 29.8*  PLT 229 273 289   Recent Labs  Lab 09/12/20 0348 09/13/20 0447 09/14/20 0431  NA 142 140 134*  K 4.7 4.1 3.7  CL 103 98 95*  CO2 31 33* 32  BUN 26* 25* 26*  CREATININE 0.87 0.80 0.85  CALCIUM 8.0* 8.1* 7.8*  GLUCOSE 206* 122* 105*      Imaging/Diagnostic Tests: No new imaging.  [name], MD 09/14/2020, 7:12 AM PGY-1,  [place] Intern pager: [phone], text pages welcome

## 2020-09-14 NOTE — Progress Notes (Signed)
  Speech Language Pathology Treatment: Dysphagia  Patient Details Name: Gary [name] MRN: [id number] DOB: May 15, 1951 Today's Date: 09/14/2020 Time: 1157-2620 SLP Time Calculation (min) (ACUTE ONLY): 10 min  Assessment / Plan / Recommendation Clinical Impression  Pt's voice is stronger and louder today. He remains on higher levels of supplemental O2 but does not appear to be SOB. Note that pt had an episode overnight of decreased respiratory status requiring BiPAP, but he says that this was not associated with any PO intake. No overt s/s of aspiration are noted today. Recommend continuing current diet with careful use of aspiration precautions. Wife was present today and also educated. Will continue to follow.   HPI HPI: Pt is a 70 yo male presenting with progressive dyspnea. 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 He required intubation for acute GI bleed 4/22-4/26. EGD showed a large duodenal ulcer, Barrett's esophagus, small hiatal hernia. PMH includes: HTN, HLD, DM      SLP Plan  Continue with current plan of care       Recommendations  Diet recommendations: Dysphagia 3 (mechanical soft);Thin liquid Liquids provided via: Cup;Straw Medication Administration: Whole meds with puree Supervision: Patient able to self feed;Intermittent supervision to cue for compensatory strategies Compensations: Slow rate;Small sips/bites;Other (Comment) Postural Changes and/or Swallow Maneuvers: Seated upright 90 degrees                Oral Care Recommendations: Oral care BID Follow up Recommendations:  (tba) SLP Visit Diagnosis: Dysphagia, unspecified (R13.10) Plan: Continue with current plan of care       GO                Osie Bond., M.A. [place] [facility] Pager [phone] Office [phone]  09/14/2020, 4:42 PM

## 2020-09-14 NOTE — Progress Notes (Signed)
   09/14/20 1959  Therapy Vitals  Pulse Rate 68  Resp (!) 29  Patient Position (if appropriate) Lying  MEWS Score/Color  MEWS Score 2  MEWS Score Color Yellow  Oxygen Therapy/Pulse Ox  $ Pulse Ox Multi Deter  Yes  O2 Device HFNC  $ High Flow Nasal Cannula  Yes  O2 Therapy Oxygen humidified  O2 Flow Rate (L/min) 40 L/min  FiO2 (%) 100 %  SpO2 (!) 89 %  Increased due to desaturation

## 2020-09-14 NOTE — Progress Notes (Signed)
Inpatient Rehab Admissions Coordinator:  Pt not medically ready for CIR.  Will continue to follow pt's tolerance and progress with therapies as well as medical workup.   Gayland Curry, [place], [place] Admissions Coordinator [phone]

## 2020-09-14 NOTE — Progress Notes (Signed)
NAME:  Gary [name], MRN:  [id number], DOB:  1951-03-10, LOS: 10 ADMISSION DATE:  09/03/2020, CONSULTATION DATE:  09/07/2020 REFERRING MD:  Nori Riis, CHIEF COMPLAINT:  GI Bleed   History of Present Illness:  70 y/o M who presented to [facility] on 4/18 with complaints of shortness of breath.  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 Has had progressive SOB for a few months.  Admitted to [facility] 4/18 for further workup and tx, with rapidly increasing O2 requirement. R/LHC performed 4/19 revealing PAH, 100% stenosis RCA and non-obstructive lesions. Had ongoing high O2 demand, on HHFNC. His course has been c/b GIB requiring PRBC. Moved to ICU and intubated for hypoxic respiratory failure 4/22. GI consulted and performed EGD 4/22 finding short segment Barrett's esophagus, small hiatal hernia, erosive gastritis. Managed in ICU for Shock -- hemorrhagic + cardiogenic shock with cor pulmonale. Patient able to extubate 4/26 and transferred out of the ICU 4/27 back to FPTS, remaining on [facility].   4/29 PCCM reconsulted for pulmonary recommendations regarding ongoing high O2 need.   Pertinent  Medical History  Former smoker HTN DM Traumatic crush injury from a tree branch with multiple fractures Epistaxis requiring packing   Significant Hospital Events: Including procedures, antibiotic start and stop dates in addition to other pertinent events    4/18 admitted with SOB, echo with elevated right heart pressures, new finding of A. fib  4/19 R/L heart cath with concern for pulmonary hypertension  4/20 on 15L HFNC  4/21 hypotensive episode, responded to IVF, down to 5L O2  4/22 ABLA, to ICU, intubated, severe hypoxemia; EGD with nonbleeding duodenal ulcer  4/23 failed veletri wean  4/26 veletri weaned and pt extubated  4/27 transferred out of ICU  4/29 PCCM re-consulted for recommendations regarding ongoing hypoxia and high oxygen demand. CCM ordering CXR, CTA chest    Interim  History / Subjective:   Says he feels better.  30L HFNC   Objective   Blood pressure (!) 100/57, pulse 69, temperature 98.6 F (37 C), temperature source Oral, resp. rate 17, height _0  (1.702 m), weight 87.3 kg, SpO2 97 %. CVP:  [2 mmHg-9 mmHg] 5 mmHg  FiO2 (%):  [89 %-100 %] 100 %   Intake/Output Summary (Last 24 hours) at 09/14/2020 1356 Last data filed at 09/14/2020 1315 Gross per 24 hour  Intake 332.29 ml  Output 1075 ml  Net -742.71 ml   Filed Weights   09/12/20 0500 09/13/20 0600 09/14/20 0524  Weight: 84.6 kg 84.9 kg 87.3 kg    Examination: Constitutional:  HEENT:  Cardiovascular:  Respiratory:   Gastrointestinal:  Skin:  Neurologic:  Labs/imaging that I havepersonally reviewed  (right click and "Reselect all SmartList Selections" daily)   4/29 CXR is pending>  4/29 CTA chest>  4/29 BLE dopplers>    4/28 coox 82.8 > 49.6 4/29 BMP grossly normal, CBC with hgb 9.3   Is net negative 3L this admission   Resolved Hospital Problem list     Assessment & Plan:    Acute hypoxemic respiratory failure, suspect multifactorial - tx for possible PNA w- ceftriaxone 4/23- 4/27 - probably some atelectasis -is net negative this admission, but R heart failure possibly contributing -hasn't had a CXR since 4/23, and has been off anticoag due to GIB   P -CXR  -CTA chest  -will recheck a coox  -IS, mobility  -BDs, ICS, Decadron     Pulmonary HTN, cor pulmonale, likely secondary to OSA, COPD  Afib not on AC due to recent GIB ALBA - UGIB favored. Non-bleeding duodenal ulcers on EGF   Abnormal CT question of RML mass needs OP f/u; less impressed by this P - Outpatient workup for pHTN - PFTs, sleep study - Bronchodilator therapy (aformoterol and revefenacin nebs), inhaled corticosteroid (budesonide) and decadron for possible COPD -  Continue sildenafil. Off milrinone. Adv Hf following  - Amio. Not on AC due to recent GIB  -  f/u duodenal biopsy, pantoprazole  Q12HR   Best practice (right click and "Reselect all SmartList Selections" daily)  Diet: PO Pain/Anxiety/Delirium protocol (if indicated): No VAP protocol (if indicated): Not indicated DVT prophylaxis: PAS hose GI prophylaxis: PPI Glucose control:  lantus/SSI Central venous access:  yes Arterial line: no Foley:  yep Mobility:  bed rest   PT consulted:Yes Last date of multidisciplinary goals of care discussion: per primary  Code Status:  full code Disposition: Progressive     Pulmonary critical care attending:  This is a 70 year old gentleman that originally presented on 418 with complaints of shortness of breath, was a [employer] administrator for 32 years.  Also history of frequent c-Met and woodworking exposures.  Initially brought in GI bleed requiring blood transfusion.  Hemorrhagic shock cardiogenic shock with decompensated right ventricular failure.  Patient was briefly on Veletri.  Diuresed and managed by her heart failure service.  Patient was started on sildenafil.  Weaned off of milrinone.  Has been diuresed to CVP of 2.  Unfortunately for the past 24 hours has had escalating oxygen requirements.  Has been off anticoagulation due to GI bleeding.  He is a former smoker has hypertension and diabetes.  Currently seeing him he is on 30 L and 80% FiO2 with heated high flow.  Able to communicate in complete sentences.  Wife at bedside.  BP (!) 100/57 (BP Location: Right Arm)   Pulse 69   Temp 98.6 F (37 C) (Oral)   Resp 17   Ht _0  (1.702 m)   Wt 87.3 kg   SpO2 97%   BMI 30.14 kg/m   General: Elderly male resting in bed able to speak in full sentences however all requiring significant mount of oxygen on heated high flow 80% and 30 L. HEENT: Tracking appropriately Heart: Regular rate rhythm S1-S2 Lungs: Bilateral breath sounds diminished, no crackles on exam. Abdomen: Nontender nondistended  Labs: Reviewed Coox 62.5  Chest x-ray small bilateral effusions, faint patchy  airspace disease no overt reason for such significant hypoxemia on chest x-ray. The patient's images have been independently reviewed by me.    Assessment: Severe acute hypoxemic respiratory failure requiring heated high flow nasal cannula Severe pulmonary hypertension Decompensated cor pulmonale, diuresed off milrinone, on sildenafil History of tobacco use History of hypertension. Admitted for GI bleeding, resolved has been off anticoagulation.  Plan: Chest x-ray reviewed He appears dry on exam. CTA chest rule out PE. Continue pulmonary toilet, up out of bed much tolerated PT OT Continue bronchodilators, Candiss Norse, Pulmicort  Garner Nash, DO  Pulmonary Critical Care 09/14/2020 6:39 PM

## 2020-09-14 NOTE — Plan of Care (Signed)
  Problem: Education: Goal: Knowledge of General Education information will improve Description: Including pain rating scale, medication(s)/side effects and non-pharmacologic comfort measures Outcome: Progressing   Problem: Health Behavior/Discharge Planning: Goal: Ability to manage health-related needs will improve Outcome: Progressing   Problem: Clinical Measurements: Goal: Ability to maintain clinical measurements within normal limits will improve Outcome: Progressing Goal: Will remain free from infection Outcome: Progressing Goal: Diagnostic test results will improve Outcome: Progressing Goal: Respiratory complications will improve Outcome: Progressing Goal: Cardiovascular complication will be avoided Outcome: Progressing   Problem: Activity: Goal: Risk for activity intolerance will decrease Outcome: Progressing   Problem: Nutrition: Goal: Adequate nutrition will be maintained Outcome: Progressing   Problem: Coping: Goal: Level of anxiety will decrease Outcome: Progressing   Problem: Elimination: Goal: Will not experience complications related to bowel motility Outcome: Progressing Goal: Will not experience complications related to urinary retention Outcome: Progressing   Problem: Pain Managment: Goal: General experience of comfort will improve Outcome: Progressing   Problem: Safety: Goal: Ability to remain free from injury will improve Outcome: Progressing   Problem: Skin Integrity: Goal: Risk for impaired skin integrity will decrease Outcome: Progressing   Problem: Education: Goal: Ability to demonstrate management of disease process will improve Outcome: Progressing Goal: Ability to verbalize understanding of medication therapies will improve Outcome: Progressing Goal: Individualized Educational Video(s) Outcome: Progressing   Problem: Activity: Goal: Capacity to carry out activities will improve Outcome: Progressing   Problem: Cardiac: Goal:  Ability to achieve and maintain adequate cardiopulmonary perfusion will improve Outcome: Progressing   Problem: Activity: Goal: Ability to tolerate increased activity will improve Outcome: Progressing   Problem: Respiratory: Goal: Ability to maintain a clear airway and adequate ventilation will improve Outcome: Progressing   Problem: Role Relationship: Goal: Method of communication will improve Outcome: Progressing

## 2020-09-14 NOTE — Progress Notes (Signed)
Physical Therapy Treatment Patient Details Name: Gary [name] MRN: [id number] DOB: [birth year] Today's Date: 09/14/2020    History of Present Illness Patient is a 70 y/o male who presents on 09/03/20 with progressive SOB. Found to have acute respiratory failure likely due to new onset of heart failure and A-fib with RVR. CXR- Rt effusion, prominent interstitial edema/disease. s/p cardiac cath 09/04/20. 4/21 hypotension, 4/22 drop in Hgb, GIB with intubation and urgent bedside EGD on 4/22 with PT sign off.  Pt was extubated on 4/26. PT reordered on 4/27.  PMhx: HTN and DM.    PT Comments    Pt making progress with mobility. Should be able to incr amb distance as his O2 requirement go down. Pt very motivated to return to independence. Continue to recommend CIR for further rehab.    Follow Up Recommendations  CIR     Equipment Recommendations  Rolling walker with 5" wheels;3in1 (PT)    Recommendations for Other Services       Precautions / Restrictions Precautions Precautions: Fall Precaution Comments: watch 02 (on high flow), HR    Mobility  Bed Mobility               General bed mobility comments: Pt up in chair    Transfers Overall transfer level: Needs assistance Equipment used: Rolling walker (2 wheeled) Transfers: Sit to/from Stand Sit to Stand: Min assist;+2 safety/equipment         General transfer comment: Assist to bring hips up. Verbal cues for hand placement  Ambulation/Gait Ambulation/Gait assistance: Min assist;+2 safety/equipment Gait Distance (Feet): 20 Feet (5' forward and back x 2, after rest 5' forward and back x 1) Assistive device: Rolling walker (2 wheeled) Gait Pattern/deviations: Step-through pattern;Decreased step length - right;Decreased step length - left;Antalgic;Trunk flexed Gait velocity: decreased Gait velocity interpretation: <1.31 ft/sec, indicative of household ambulator General Gait Details: Assist for balance. Initial limp on  painful rt knee that improves with incr distance. Limited distance due to heated high flow   Stairs             Wheelchair Mobility    Modified Rankin (Stroke Patients Only)       Balance Overall balance assessment: Needs assistance Sitting-balance support: No upper extremity supported;Feet supported Sitting balance-Leahy Scale: Fair     Standing balance support: Bilateral upper extremity supported;During functional activity Standing balance-Leahy Scale: Poor Standing balance comment: walker and min guard for static standing                            Cognition Arousal/Alertness: Awake/alert Behavior During Therapy: WFL for tasks assessed/performed Overall Cognitive Status: Within Functional Limits for tasks assessed                                        Exercises General Exercises - Lower Extremity Long Arc Quad: AROM;Both;Seated;10 reps Hip Flexion/Marching: AROM;Both;10 reps;Seated    General Comments General comments (skin integrity, edema, etc.): Pt on 30L heated high flow. SpO2 >90% with activity      Pertinent Vitals/Pain Pain Assessment: Faces Faces Pain Scale: Hurts a little bit Pain Location: rt knee Pain Descriptors / Indicators: Aching;Sore Pain Intervention(s): Limited activity within patient's tolerance;Monitored during session    Home Living                      Prior  Function            PT Goals (current goals can now be found in the care plan section) Acute Rehab PT Goals Patient Stated Goal: return to independence, be able to breathe Progress towards PT goals: Progressing toward goals    Frequency    Min 3X/week      PT Plan      Co-evaluation              AM-PAC PT "6 Clicks" Mobility   Outcome Measure  Help needed turning from your back to your side while in a flat bed without using bedrails?: A Little Help needed moving from lying on your back to sitting on the side of a flat  bed without using bedrails?: A Little Help needed moving to and from a bed to a chair (including a wheelchair)?: A Little Help needed standing up from a chair using your arms (e.g., wheelchair or bedside chair)?: A Little Help needed to walk in hospital room?: A Little Help needed climbing 3-5 steps with a railing? : A Lot 6 Click Score: 17    End of Session Equipment Utilized During Treatment: Oxygen Activity Tolerance: Patient tolerated treatment well Patient left: in chair;with call bell/phone within reach;with chair alarm set;with family/visitor present Nurse Communication: Mobility status PT Visit Diagnosis: Muscle weakness (generalized) (M62.81);Difficulty in walking, not elsewhere classified (R26.2);Other (comment)     Time: 3709-6438 PT Time Calculation (min) (ACUTE ONLY): 21 min  Charges:  $Gait Training: 8-22 mins                     [place] Pager [phone] Office [place] 09/14/2020, 4:20 PM

## 2020-09-14 NOTE — Progress Notes (Signed)
   09/14/20 0407  Therapy Vitals  Pulse Rate 92  Resp (!) 23  Patient Position (if appropriate) Lying  MEWS Score/Color  MEWS Score 2  MEWS Score Color Yellow  Oxygen Therapy/Pulse Ox  O2 Device HFNC  $ High Flow Nasal Cannula  Yes  O2 Therapy Oxygen humidified  O2 Flow Rate (L/min) 40 L/min  FiO2 (%) 100 %  SpO2 91 %  Placed pt. Back on HHFNC 100% 40L per pt. Request.

## 2020-09-14 NOTE — Progress Notes (Addendum)
Patient has history of severe epistaxis that required IR intervention for ablation. Recommend extreme caution with BIPAP use as severe epistaxis is contraindication for BIPAP.  Gladys Damme, MD [place] [facility], PGY-2

## 2020-09-14 NOTE — Progress Notes (Signed)
Patient ID: Gary [name], male   DOB: 10-20-1950, 70 y.o.   MRN: [id number]     Advanced Heart Failure Rounding Note  PCP-Cardiologist: Elouise Munroe, MD   Subjective:   Events  4/22  EGD 4/22 showed 2 ulcers in duodenum that were not actively bleeding.  He remains on Protonix gtt, no overt bleeding today.  Hgb 10.8    4/23 Intubated. Dobutamine stopped due to marked tachycardia. Started antibiotics for pneumonia. Blood CX- NGTD. Sputum with strep.  4/24 Did not tolerate fast veletri wean.  4/25 Veletri stopped. Extubated. Give 1u PRBC   Remains on HFNC O2 at 25L Denies CP or SOB. Used CPAP last night.   Still in AF on IV amio. Rate controlled. No AC due to GI bleed  Weight up 5 pounds today  Objective:   Weight Range: 87.3 kg Body mass index is 30.14 kg/m.   Vital Signs:   Temp:  [97.7 F (36.5 C)-99.9 F (37.7 C)] 98.9 F (37.2 C) (04/29 0402) Pulse Rate:  [74-102] 92 (04/29 0407) Resp:  [15-26] 23 (04/29 0407) BP: (82-108)/(54-66) 95/64 (04/29 0402) SpO2:  [90 %-98 %] 92 % (04/29 0407) FiO2 (%):  [89 %-100 %] 100 % (04/29 0407) Weight:  [87.3 kg] 87.3 kg (04/29 0524) Last BM Date: 09/13/20  Weight change: Filed Weights   09/12/20 0500 09/13/20 0600 09/14/20 0524  Weight: 84.6 kg 84.9 kg 87.3 kg    Intake/Output:   Intake/Output Summary (Last 24 hours) at 09/14/2020 0803 Last data filed at 09/14/2020 0403 Gross per 24 hour  Intake 452.64 ml  Output 400 ml  Net 52.64 ml      Physical Exam   General:  Sitting up in bed on HFNC . No resp difficulty HEENT: normal Neck: supple. no JVD. Carotids 2+ bilat; no bruits. No lymphadenopathy or thryomegaly appreciated. Cor: PMI nondisplaced. Irregular rate & rhythm. No rubs, gallops or murmurs. Lungs: coarse Abdomen: obese soft, nontender, nondistended. No hepatosplenomegaly. No bruits or masses. Good bowel sounds. Extremities: no cyanosis, clubbing, rash, edema Neuro: alert & orientedx3, cranial nerves grossly  intact. moves all 4 extremities w/o difficulty. Affect pleasant   Telemetry    AF 90s Personally reviewed  Labs    CBC Recent Labs    09/13/20 0447 09/14/20 0431  WBC 10.1 8.6  HGB 8.7* 9.3*  HCT 28.0* 29.8*  MCV 98.2 96.4  PLT 273 785   Basic Metabolic Panel Recent Labs    09/13/20 0447 09/14/20 0431  NA 140 134*  K 4.1 3.7  CL 98 95*  CO2 33* 32  GLUCOSE 122* 105*  BUN 25* 26*  CREATININE 0.80 0.85  CALCIUM 8.1* 7.8*   Liver Function Tests No results for input(s): AST, ALT, ALKPHOS, BILITOT, PROT, ALBUMIN in the last 72 hours. No results for input(s): LIPASE, AMYLASE in the last 72 hours. Cardiac Enzymes No results for input(s): CKTOTAL, CKMB, CKMBINDEX, TROPONINI in the last 72 hours.  BNP: BNP (last 3 results) Recent Labs    09/03/20 1433  BNP 1,202.7*    ProBNP (last 3 results) No results for input(s): PROBNP in the last 8760 hours.   D-Dimer No results for input(s): DDIMER in the last 72 hours. Hemoglobin A1C No results for input(s): HGBA1C in the last 72 hours. Fasting Lipid Panel No results for input(s): CHOL, HDL, LDLCALC, TRIG, CHOLHDL, LDLDIRECT in the last 72 hours. Thyroid Function Tests No results for input(s): TSH, T4TOTAL, T3FREE, THYROIDAB in the last 72 hours.  Invalid input(s):  FREET3  Other results:   Imaging    No results found.   Medications:     Scheduled Medications: . sodium chloride   Intravenous Once  . arformoterol  15 mcg Nebulization BID  . atorvastatin  40 mg Oral Daily  . budesonide  0.5 mg Nebulization BID  . chlorhexidine  15 mL Mouth Rinse BID  . Chlorhexidine Gluconate Cloth  6 each Topical Daily  . feeding supplement  237 mL Oral BID BM  . insulin aspart  0-9 Units Subcutaneous TID WC  . insulin glargine  20 Units Subcutaneous Daily  . mouth rinse  15 mL Mouth Rinse BID  . mouth rinse  15 mL Mouth Rinse q12n4p  . midodrine  10 mg Oral TID WC  . pantoprazole  40 mg Oral BID  . polyethylene  glycol  17 g Oral Daily  . revefenacin  175 mcg Nebulization Daily  . sildenafil  40 mg Oral TID  . sodium chloride flush  10-40 mL Intracatheter Q12H    Infusions: . sodium chloride Stopped (09/10/20 1336)  . sodium chloride 10 mL/hr at 09/13/20 0900  . amiodarone 30 mg/hr (09/14/20 0431)    PRN Medications: sodium chloride, acetaminophen, ondansetron (ZOFRAN) IV, sodium chloride flush    Assessment/Plan    1. Acute upper GI bleed with symptomatic anemia - hgb down to 6.7. now stable at 9.3 - Eliquis off - CT negative for RP bleed - On Protonix gtt - EGD with 2 ulcers not acutely bleeding.  - Would not restart AC for 2 weeks to allow GI healing - Biopsy back for H pylori. Treatment per GI. Would favor Rx if possible  2. PAH with cor pulmonale - CT chest 4/22: No PE or ILD - Echo LVEF 60% severe RV dilation and HK - Cath with moderate PAH. PA = 66/31 (46) PCW = 13 Fick = 4.1/2.1 PVR = 7.8 WU - Suspect this is WHO Group 3 PAH due to hypoxic lung disease/OHS/OSA - Will order PFTs (when better compensated) and will need outpatient sleep study - Check serologies for completeness sake>> in process   - On sildenafil 40 mg tid.  - O2 requirements far out of proportion to [facility] or HF. Suspect intrinsic lung disease  3. Paroxysmal AF - new onset. Unclear duration  - Eliquis on hold d/t GI bleed - Was in NSR last week. Now back in AF x 48 hours  - No AC with recent large GI bleed - Continue SCDs.  - Continue IV amio. Will bolus today   4. CAD  -mostly non-obstructive - atorva 40 added  - No ASA with AC - No s/s angina  5. R lung mass - will need CT f/u   6. Shock - Suspect mixed hemorrhagic/cardiogenic (RV failure).   - Resolved  7. Acute hypoxemic respiratory failure - Intubated pre-EGD on 4/22.   - Extubated--> on HFNC - O2 requirements far out of proportion to [facility] or HF. Suspect intrinsic lung disease    Length of Stay: [place], MD   09/14/2020, 8:03 AM  Advanced Heart Failure Team Pager [phone] (M-F; 7a - 5p)  Please contact [facility] for night-coverage after hours (5p -7a ) and weekends on [email or web address]

## 2020-09-15 DIAGNOSIS — I50813 Acute on chronic right heart failure: Secondary | ICD-10-CM

## 2020-09-15 DIAGNOSIS — I4819 Other persistent atrial fibrillation: Secondary | ICD-10-CM

## 2020-09-15 DIAGNOSIS — I484 Atypical atrial flutter: Secondary | ICD-10-CM

## 2020-09-15 DIAGNOSIS — J9601 Acute respiratory failure with hypoxia: Secondary | ICD-10-CM | POA: Diagnosis not present

## 2020-09-15 LAB — BASIC METABOLIC PANEL
Anion gap: 10 (ref 5–15)
BUN: 22 mg/dL (ref 8–23)
CO2: 32 mmol/L (ref 22–32)
Calcium: 7.5 mg/dL — ABNORMAL LOW (ref 8.9–10.3)
Chloride: 92 mmol/L — ABNORMAL LOW (ref 98–111)
Creatinine, Ser: 0.97 mg/dL (ref 0.61–1.24)
GFR, Estimated: >60 mL/min (ref >60)
Glucose, Bld: 85 mg/dL (ref 70–99)
Potassium: 3.6 mmol/L (ref 3.5–5.1)
Sodium: 134 mmol/L — ABNORMAL LOW (ref 135–145)

## 2020-09-15 LAB — CBC
HCT: 28.8 % — ABNORMAL LOW (ref 39.0–52.0)
Hemoglobin: 9.3 g/dL — ABNORMAL LOW (ref 13.0–17.0)
MCH: 30 pg (ref 26.0–34.0)
MCHC: 32.3 g/dL (ref 30.0–36.0)
MCV: 92.9 fL (ref 80.0–100.0)
Platelets: 305 10*3/uL (ref 150–400)
RBC: 3.1 MIL/uL — ABNORMAL LOW (ref 4.22–5.81)
RDW: 14.5 % (ref 11.5–15.5)
WBC: 9.6 10*3/uL (ref 4.0–10.5)
nRBC: 0.4 % — ABNORMAL HIGH (ref 0.0–0.2)

## 2020-09-15 LAB — GLUCOSE, CAPILLARY
Glucose-Capillary: 89 mg/dL (ref 70–99)
Glucose-Capillary: 229 mg/dL — ABNORMAL HIGH (ref 70–99)
Glucose-Capillary: 281 mg/dL — ABNORMAL HIGH (ref 70–99)
Glucose-Capillary: 153 mg/dL — ABNORMAL HIGH (ref 70–99)

## 2020-09-15 LAB — COOXEMETRY PANEL
Carboxyhemoglobin: 1 % (ref 0.5–1.5)
Methemoglobin: 0.8 % (ref 0.0–1.5)
O2 Saturation: 53.1 %
Total hemoglobin: 10.5 g/dL — ABNORMAL LOW (ref 12.0–16.0)

## 2020-09-15 MED ORDER — AYR SALINE NASAL NA GEL
1.0000 "application " | Freq: Two times a day (BID) | NASAL | Status: DC
  Administered 2020-09-15 – 2020-10-02 (×22): 1 via NASAL
  Filled 2020-09-15 (×3): qty 14.1

## 2020-09-15 MED ORDER — SALINE SPRAY 0.65 % NA SOLN
1.0000 | NASAL | Status: DC | PRN
  Administered 2020-09-15 – 2020-10-02 (×6): 1 via NASAL
  Filled 2020-09-15: qty 44

## 2020-09-15 NOTE — Plan of Care (Signed)
  Problem: Education: Goal: Knowledge of General Education information will improve Description: Including pain rating scale, medication(s)/side effects and non-pharmacologic comfort measures Outcome: Progressing   Problem: Health Behavior/Discharge Planning: Goal: Ability to manage health-related needs will improve Outcome: Progressing   Problem: Clinical Measurements: Goal: Ability to maintain clinical measurements within normal limits will improve Outcome: Progressing Goal: Will remain free from infection Outcome: Progressing Goal: Diagnostic test results will improve Outcome: Progressing Goal: Respiratory complications will improve Outcome: Progressing Goal: Cardiovascular complication will be avoided Outcome: Progressing   Problem: Activity: Goal: Risk for activity intolerance will decrease Outcome: Progressing   Problem: Nutrition: Goal: Adequate nutrition will be maintained Outcome: Progressing   Problem: Coping: Goal: Level of anxiety will decrease Outcome: Progressing   Problem: Elimination: Goal: Will not experience complications related to bowel motility Outcome: Progressing Goal: Will not experience complications related to urinary retention Outcome: Progressing   Problem: Pain Managment: Goal: General experience of comfort will improve Outcome: Progressing   Problem: Safety: Goal: Ability to remain free from injury will improve Outcome: Progressing   Problem: Skin Integrity: Goal: Risk for impaired skin integrity will decrease Outcome: Progressing   Problem: Education: Goal: Ability to demonstrate management of disease process will improve Outcome: Progressing Goal: Ability to verbalize understanding of medication therapies will improve Outcome: Progressing Goal: Individualized Educational Video(s) Outcome: Progressing   Problem: Activity: Goal: Capacity to carry out activities will improve Outcome: Progressing   Problem: Cardiac: Goal:  Ability to achieve and maintain adequate cardiopulmonary perfusion will improve Outcome: Progressing   Problem: Activity: Goal: Ability to tolerate increased activity will improve Outcome: Progressing   Problem: Respiratory: Goal: Ability to maintain a clear airway and adequate ventilation will improve Outcome: Progressing   Problem: Role Relationship: Goal: Method of communication will improve Outcome: Progressing

## 2020-09-15 NOTE — Progress Notes (Signed)
Progress Note  Patient Name: Gary [name] Date of Encounter: 09/15/2020  [facility] Cardiologist: Elouise Munroe, MD   Subjective   Feeling better.  Eager to know that he is making progress. Remains on high flow oxygen.  Hemoglobin stable at 9.3. Rhythm has organized from atrial fibrillation to atrial flutter with 4: 1 AV block and a ventricular rate at 70 bpm. Weight reportedly has increased 4 pounds since yesterday and is up 6 pounds since he had his cardiac catheterization on 04 19. Chest CT does not show any evidence of pulmonary embolism, but does show slight increase in the size of the pleural effusions and increased upper lobe infiltrates, possible edema versus infection.  Inpatient Medications    Scheduled Meds: . sodium chloride   Intravenous Once  . arformoterol  15 mcg Nebulization BID  . atorvastatin  40 mg Oral Daily  . budesonide  0.5 mg Nebulization BID  . chlorhexidine  15 mL Mouth Rinse BID  . Chlorhexidine Gluconate Cloth  6 each Topical Daily  . feeding supplement  237 mL Oral BID BM  . insulin aspart  0-9 Units Subcutaneous TID WC  . insulin glargine  20 Units Subcutaneous Daily  . mouth rinse  15 mL Mouth Rinse BID  . mouth rinse  15 mL Mouth Rinse q12n4p  . midodrine  10 mg Oral TID WC  . pantoprazole  40 mg Oral BID  . polyethylene glycol  17 g Oral Daily  . revefenacin  175 mcg Nebulization Daily  . sildenafil  40 mg Oral TID  . sodium chloride flush  10-40 mL Intracatheter Q12H  . torsemide  40 mg Oral Daily   Continuous Infusions: . sodium chloride Stopped (09/10/20 1336)  . sodium chloride 10 mL/hr at 09/13/20 0900  . amiodarone 30 mg/hr (09/15/20 0120)   PRN Meds: sodium chloride, acetaminophen, ondansetron (ZOFRAN) IV, sodium chloride flush   Vital Signs    Vitals:   09/15/20 0739 09/15/20 0755 09/15/20 1057 09/15/20 1115  BP:  (!) 91/53  106/64  Pulse:  72  70  Resp:  (!) 24  14  Temp:  98.3 F (36.8 C)  98 F (36.7 C)   TempSrc:  Oral  Oral  SpO2: 96% 95% 100% 99%  Weight:      Height:        Intake/Output Summary (Last 24 hours) at 09/15/2020 1145 Last data filed at 09/15/2020 0947 Gross per 24 hour  Intake 350 ml  Output 1850 ml  Net -1500 ml   Last 3 Weights 09/15/2020 09/14/2020 09/13/2020  Weight (lbs) 196 lb 6.9 oz 192 lb 7.4 oz 187 lb 2.7 oz  Weight (kg) 89.1 kg 87.3 kg 84.9 kg      Telemetry    Atrial flutter with 4: 1 AV block- Personally Reviewed  ECG    Atrial fibrillation rapid ventricular response, RBBB, left axis deviation- Personally Reviewed  Physical Exam  Sitting up in chair, looks comfortable GEN: No acute distress.   Neck:  Difficult to see JVD, probably no more than 5-6 cm above sternal angle Cardiac: RRR, no murmurs, rubs, or gallops.  Respiratory: Clear to auscultation bilaterally. GI: Soft, nontender, non-distended  MS:  2-3+ soft pitting symmetrical pretibial edema; No deformity. Neuro:  Nonfocal  Psych: Normal affect   Labs    High Sensitivity Troponin:   Recent Labs  Lab 09/03/20 1040 09/03/20 1433 09/03/20 1803  TROPONINIHS 98* 101* 98*      Chemistry Recent Labs  Lab 09/13/20 0447 09/14/20 0431 09/15/20 0444  NA 140 134* 134*  K 4.1 3.7 3.6  CL 98 95* 92*  CO2 33* 32 32  GLUCOSE 122* 105* 85  BUN 25* 26* 22  CREATININE 0.80 0.85 0.97  CALCIUM 8.1* 7.8* 7.5*  GFRNONAA >60 >60 >60  ANIONGAP 9 7 10      Hematology Recent Labs  Lab 09/13/20 0447 09/14/20 0431 09/15/20 0444  WBC 10.1 8.6 9.6  RBC 2.85* 3.09* 3.10*  HGB 8.7* 9.3* 9.3*  HCT 28.0* 29.8* 28.8*  MCV 98.2 96.4 92.9  MCH 30.5 30.1 30.0  MCHC 31.1 31.2 32.3  RDW 14.7 14.6 14.5  PLT 273 289 305    BNPNo results for input(s): BNP, PROBNP in the last 168 hours.   DDimer No results for input(s): DDIMER in the last 168 hours.   Radiology    CT ANGIO CHEST PE W OR WO CONTRAST  Result Date: 09/14/2020 CLINICAL DATA:  Chest pain, short of breath EXAM: CT ANGIOGRAPHY CHEST  WITH CONTRAST TECHNIQUE: Multidetector CT imaging of the chest was performed using the standard protocol during bolus administration of intravenous contrast. Multiplanar CT image reconstructions and MIPs were obtained to evaluate the vascular anatomy. CONTRAST:  161m OMNIPAQUE IOHEXOL 350 MG/ML SOLN COMPARISON:  09/14/2020, 09/03/2020 FINDINGS: Cardiovascular: This is a technically adequate evaluation of the pulmonary vasculature. No filling defects or pulmonary emboli. Heart is enlarged with prominent biventricular dilatation right greater than left. Dilation of the pulmonary artery consistent with chronic pulmonary arterial hypertension, stable. No evidence of thoracic aortic aneurysm. Diffuse atherosclerosis of the aorta is unchanged. Mediastinum/Nodes: Mediastinal and hilar adenopathy again noted, most pronounced in the subcarinal region measuring up to 22 mm in short axis. Thyroid, trachea, and esophagus are unremarkable. Lungs/Pleura: There are small bilateral pleural effusions, right greater than left, slightly increased since prior exam. Progressive bilateral perihilar airspace disease greatest in the upper lobes, which could reflect multifocal infection or edema. There is continued nodular consolidation in the right middle lobe has been present measuring 2.7 x 1.3 cm, unchanged since prior exam. Close follow-up is warranted to assess resolution and exclude underlying neoplasm. No pneumothorax.  The central airways are patent. 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 Remainder of the upper abdomen is unremarkable. Musculoskeletal: No acute or destructive bony lesions. Reconstructed images demonstrate no additional findings. Review of the MIP images confirms the above findings. IMPRESSION: 1. No evidence of pulmonary embolus. 2. Progressive bilateral perihilar airspace disease, greatest in the upper lobes, favor multifocal infection over edema. 3. Persistent nodular consolidation  within the right middle lobe. While this could reflect another focus of infection, close follow-up is warranted to exclude underlying neoplasm. 4. Bilateral pleural effusions, right greater than left, slightly increased since prior study. 5. Stable cardiomegaly. Electronically Signed   By: MRanda NgoM.D.   On: 09/14/2020 21:55   DG CHEST PORT 1 VIEW  Result Date: 09/14/2020 CLINICAL DATA:  Hypoxia. EXAM: PORTABLE CHEST 1 VIEW COMPARISON:  Single-view of the chest 09/07/2020 and 09/03/2020. CT chest 09/03/2020. PA and lateral chest 09/05/2020. FINDINGS: The patient has been extubated since the prior exam. Left IJ central venous catheter remains in place. Small bilateral pleural effusions and patchy bibasilar airspace disease do not appear markedly changed. There is cardiomegaly without edema. Aortic atherosclerosis noted. IMPRESSION: No marked change in small bilateral pleural effusions and patchy basilar airspace disease since the most recent exam. Cardiomegaly. Electronically Signed   By: TInge RiseM.D.  On: 09/14/2020 15:43    Cardiac Studies   Cardiac catheterization 09/05/2018.    Dist RCA lesion is 100% stenosed.  Ost LAD to Mid LAD lesion is 20% stenosed.  Mid LAD lesion is 40% stenosed.  Prox RCA lesion is 40% stenosed.   Findings:  Ao = 95/60 (76) LV = 95/14  RA = 12 RV = 60/13 PA = 66/31 (46) PCW = 13 Fick cardiac output/index = 4.1/2.1 PVR = 7.8 WU Ao sat = 99% PA sat =63%, 65%  Assessment: 1. 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 There is a large RV marginal branch that is widely patent 2. EF 60-65% 3. Moderate PAH with elevated PVR and moderately reduced cardiac output  Echocardiogram 09/03/2020  1. There is severe RV enlargement with severely depressed RV systolic function and moderately elevated PASP. The interventricular septum is flattened in systole consistent with RV pressure overload.  Constellation of findings highly concerning for pulmonary embolism. Cardiology team aware and CTA pending.  2. Left ventricular ejection fraction, by estimation, is 55 to 60%. The left ventricle has normal function. Wall motion difficult to assess due to poor visualization of the LV endocardium but appears grossly normal. There is mild concentric left ventricular hypertrophy. Diastolic function is indeterminant due to atrial fibrillation. There is the interventricular septum is flattened in systole, consistent with right ventricular pressure overload.  3. Right ventricular systolic function is severely reduced. The right ventricular size is severely enlarged. There is moderately elevated pulmonary artery systolic pressure. The estimated right ventricular  systolic pressure is 32.4 mmHg.  4. Right atrial size was moderately dilated.  5. The mitral valve is grossly normal. Mild mitral valve regurgitation.  6. Tricuspid valve regurgitation is moderate.  7. The aortic valve is tricuspid. There is moderate calcification of the aortic valve. There is moderate thickening of the aortic valve. Aortic valve regurgitation is trivial. Mild to moderate aortic valve  sclerosis/calcification is present, without any evidence of aortic stenosis.  8. The inferior vena cava is dilated in size with <50% respiratory variability, suggesting right atrial pressure of 15 mmHg.   Comparison(s): No prior Echocardiogram.   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  Successfully extubated 04 26, still requiring high flow  oxygen  Assessment & Plan     1.  Acute hypoxemic respiratory failure Cardiac catheterization does not support left heart failure, has moderate PAH, dominant abnormality appears to be severe intrinsic lung disease.  Extubated, still with high oxygen requirements.  2. PAH withcor pulmonale/right heart failure - CT chest 4/22: No PE or ILD - Echo LVEF 60% severe RV dilation and HK - Cath with moderate PAH.PA = 66/31 (46) PCW = 13 Fick = 4.1/2.1 PVR = 7.8 WU - Suspect this is WHO Group 3 PAH due to hypoxic lung disease/OHS/OSA - Will order PFTs(when better compensated)and will need outpatient sleep study - Note nominally abnormal ANA, with borderline elevation in chromatin antibody, but all other serologies negative.  ESR only mildly abnormal at 44, likely related to anemia. - On sildenafil 40 mg tid.  - Weight has reportedly increased 4 pounds since yesterday, 6 pounds since he had  right heart catheterization which showed PCW 13 and RAP 12.  BP is borderline low, renal parameters are normal. -Torsemide 40 mg once daily started yesterday morning.  3. Paroxysmal AF - Newly diagnosed, unclear duration, but was in normal sinus rhythm a week ago.  Has organized to atrial flutter and may soon convert back to normal rhythm.  Rate control is no longer an issue. - No AC with recent large GI bleed - Continue IV amio.  Transition to p.o. if rate control remains good in the next 24 hours.  4. CAD - Asymptomatic single-vessel disease with chronic total occlusion of the right coronary artery.  On statin.  Not receiving aspirin due to GI bleed.  5. R lung mass - will need CT f/u  6. Shock - Suspect mixed hemorrhagic/cardiogenic (RV failure).   - Resolved  7. GI bleed - hgb nadir 6.7. now stable at 9.3 - Eliquis off - CT negative for RP bleed - On Protonix gtt - EGD with 2 ulcers not acutely bleeding.  - Would not restart AC for 2 weeks to allow GI healing - Biopsy back for H  pylori. Treatment per GI. Would favor Rx if possible       For questions or updates, please contact [place] Please consult [email or web address] for contact info under        Signed, Sanda Klein, MD  09/15/2020, 11:45 AM

## 2020-09-15 NOTE — Progress Notes (Signed)
NAME:  Gary [name], MRN:  [id number], DOB:  Mar 17, 1951, LOS: 11 ADMISSION DATE:  09/03/2020, CONSULTATION DATE:  09/07/2020 REFERRING MD:  Nori Riis, CHIEF COMPLAINT:  GI Bleed   History of Present Illness:  70 y/o M who presented to [facility] on 4/18 with complaints of shortness of breath. 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 Has had progressive SOB for a few months.  Admitted to [facility] 4/18 for further workup and tx, with rapidly increasing O2 requirement. R/LHC performed 4/19 revealing PAH, 100% stenosis RCA and non-obstructive lesions. Had ongoing high O2 demand, on HHFNC. His course has been c/b GIB requiring PRBC. Moved to ICU and intubated for hypoxic respiratory failure 4/22. GI consulted and performed EGD 4/22 finding short segment Barrett's esophagus, small hiatal hernia, erosive gastritis. Managed in ICU for Shock -- hemorrhagic + cardiogenic shock with cor pulmonale. Patient able to extubate 4/26 and transferred out of the ICU 4/27 back to [facility], remaining on [facility].   4/29 PCCM reconsulted for pulmonary recommendations regarding ongoing high O2 need Pertinent  Medical History  Former smoker HTN DM Traumatic crush injury from a tree branch with multiple fractures Epistaxis requiring packing   Significant Hospital Events: Including procedures, antibiotic start and stop dates in addition to other pertinent events    4/18 admitted with SOB, echo with elevated right heart pressures, new finding of A. fib  4/19 R/L heart cath with concern for pulmonary hypertension  4/20 on 15L HFNC  4/21 hypotensive episode, responded to IVF, down to 5L O2  4/22 ABLA, to ICU, intubated, severe hypoxemia; EGD with nonbleeding duodenal ulcer  4/23 failed veletri wean  4/26 veletri weaned and pt extubated  4/27 transferred Out of ICU  4/29 PCCM re-consulted for recommendations regarding ongoing hypoxia and high oxygen demand. CCM ordering CXR, CTA chest      Interim  History / Subjective:   On 1.00 mask + HFNC 40L/min >> comfortable I/O net neg 6.2L total Normal renal fxn  Objective   Blood pressure (!) 91/53, pulse 72, temperature 98.3 F (36.8 C), temperature source Oral, resp. rate (!) 24, height 5' 7"  (1.702 m), weight 89.1 kg, SpO2 95 %. CVP:  [8 mmHg] 8 mmHg  FiO2 (%):  [80 %-100 %] 100 %   Intake/Output Summary (Last 24 hours) at 09/15/2020 0913 Last data filed at 09/15/2020 0800 Gross per 24 hour  Intake 460 ml  Output 1850 ml  Net -1390 ml   Filed Weights   09/13/20 0600 09/14/20 0524 09/15/20 0551  Weight: 84.9 kg 87.3 kg 89.1 kg    Examination: Constitutional: comfortable elderly man up to chair Ears, nose, mouth, and throat: strong voice, no stridor Cardiovascular: regular, distant no M, 2-3+ pitting pretibial edema Respiratory: small breaths, decreased at bases, no wheeze, few scattered crackles Gastrointestinal: non distended, no deformity Skin: no rash Neurologic: Awake, alert, non-focal   Labs/imaging that I havepersonally reviewed  (right click and "Reselect all SmartList Selections" daily)  4/29 CXR > B effusions, patchy bibasilar infiltrates, CM  4/29 CTA chest> no PE, CM with significant RV dilation, chronic PAH, mediastinal and hilar LAD, B effusions, R>L moderate on the R, progressive perihilar infiltrates, RML nodular consolidation unclear significance (? Treated infxn).  4/29 BLE dopplers>  4/28 coox 82.8 > 49.6 4/30 normal renal fxngb 9.3   Net neg -6.2L total   Resolved Hospital Problem list     Assessment & Plan:  Acute hypoxemic respiratory failure, multifactorial, in setting B  infiltrates and effusions, severe PAH as below.  -treated for possible PNA 4/23 thru 4/27 w CTX -no evidence PE, suspect that large contributor is decompensated R heart failure. Has increased perihilar infiltrates and effusions despite diuretics, some backtracking in O2 needs since he came off milrinone. Prostacyclin  transitioned to sildenafil.  P Recommend continue diuresis as hemodynamics and renal fxn will tolerate He may benefit from r thoracentesis at some point if effusion slow to resolve w diuresis Push pulmonary hygene, mobilization No clear indication corticosteroids, continue his BD as ordered.  Continue the sildenafil   Pulmonary HTN, cor pulmonale, likely secondary to OSA, COPD, Afib. Consider also a component primary R heart dysfxn given his RCA occlusion on cath (although a collateral branch is present) P Will needs outpt PSG, PFT Will need outpt f/u with Pulm and Adv. CHF clinic Continue Amiodarone, anticoag deferred due to GIB Continue diuresis Continue sildenafil  DU with ABLA, h pylori P Stabilized, following CBC off anticoag  RML nodular infiltrate P Unclear significance, could reflect infxn (treated), progressive edema pattern.  Will need repeat CT chest as an outpt after acute issues have been optimized    Best practice (right click and "Reselect all SmartList Selections" daily)  Diet: Dys 3 Pain/Anxiety/Delirium protocol (if indicated): No VAP protocol (if indicated): Not indicated DVT prophylaxis: PAS hose GI prophylaxis: PPI Glucose control:  lantus/SSI Central venous access:  No Arterial line: yes Foley:  yes Mobility:  bed rest   PT consulted:Yes Last date of multidisciplinary goals of care discussion: pending Code Status:  full code Disposition: SDU / progressive   Baltazar Apo, MD, PhD 09/15/2020, 9:33 AM [facility] [phone] or if no answer before 7:00PM call [phone] For any issues after 7:00PM please call eLink [phone]

## 2020-09-15 NOTE — Progress Notes (Signed)
Family Medicine Teaching Service Daily Progress Note Intern Pager: [phone]  Patient name: Gary [name] Medical record number: [id number] Date of birth: 10-18-1950 Age: 70 y.o. Gender: male  Primary Care Provider: Jalene Mullet, PA-C Consultants: Cardiology, Heart Failure, Pulmonology, GI Code Status: GI  Pt Overview and Major Events to Date:  4/18 admitted 4/19 right/left heart cath with concern for pulmonary HTN 4/22 2u pRBC, intubated, EGD (nonbleeding duodenal ulcers), transferred to ICU 4/26 extubated, 1u pRBC 4/28 FPTS resumed care  Assessment and Plan: Gary [name] is a 70 y.o. 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 PMHx significant for: HTN, T2DM, HLD, depression.  Acute hypoxemic respiratory failure secondary to [facility] with cor pulmonale Breathing comfortably on 40L O2 this morning. Possible ILD component given occupational exposure. CTA chest repeated due to concern for increased oxygen requirement. Imaging notable for no evidence of PE, bilateral pleural effusions with right greater than left and stable cardiomegaly. Persistent nodular consolidation within the right middle lobe possibly with infectious etiology although cannot exclude underlying neoplasm.  -cardiology advanced heart failure following, appreciate recommendations -pulmonology following, appreciate recommendations, consider steroid therapy per pulmonology  - avoid BiPAP if possible given history of significant epistaxis -sildenafil 40 mg TID -continue LABA/LAMA/ICS -s/p CTX (4/23-4/28) for possible PNA -O2 goals sats >88% per pulmonology  -monitor respiratory status  -wean O2 as tolerated -outpatient PFTs and sleep study recommended -likely close follow up with repeat imaging to ensure nodular consolidation improves   Upper GI bleed Non-bleeding duodenal ulcers seen on EGD. Hgb stable at 9.3 this morning. -holding anticoagulation  -oral protonix 40 mg daily    H. Pylori EGD biopsy notable for H. Pylori.  -follow GI recs   pAF Rate controlled, HR 74. -continue amiodarone gtt -holding anticoagulation for 1-2 weeks per cardiology  Hypotension BP remains soft, most recently 96/59. History of hypertension, home medications include amlodipine and enalapril-HCTZ. -continue to hold home anti-hypetensives  -midodrine 10 mg TID  T2DM Most recent A1c 6.7. On glipizide 10 mg at home. CBG 166. -Lantus 20 units daily -sSSI with meals -monitor CBGs  CAD -continue statin -hold ASA given recent GI bleed  Right lung mass Noted on CTA. -repeat CT in 1 month  FEN/GI: dysphagia 3 diet  PPx: SCDs   Status is: Inpatient  Remains inpatient appropriate because:Inpatient level of care appropriate due to severity of illness   Dispo: The patient is from: Home              Anticipated d/c is to: CIR              Patient currently is not medically stable to d/c.   Difficult to place patient No        Subjective:  No acute overnight events noted. Patient sitting upright in the chair watching television. Denies dyspnea and chest pain. Updated him on plan thus far along with biopsy results of positive H. Pylori testing. Patient has no further questions or concerns at this time.   Objective: Temp:  [98.1 F (36.7 C)-98.9 F (37.2 C)] 98.9 F (37.2 C) (04/29 2333) Pulse Rate:  [66-92] 74 (04/29 2333) Resp:  [17-29] 20 (04/29 2333) BP: (95-104)/(57-67) 96/59 (04/29 2333) SpO2:  [89 %-97 %] 91 % (04/29 2333) FiO2 (%):  [80 %-100 %] 100 % (04/29 2333) Weight:  [87.3 kg] 87.3 kg (04/29 0524) Physical Exam: General: Patient sitting upright in the chair watching television, in no acute distress.  Cardiovascular: irregularly irregular  rhythm, no gallops or murmurs auscultated, capillary refill <2 Respiratory: CTAB, no rales or rhonchi noted, breathing comfortably on 40L O2, no respiratory distress noted Abdomen: soft, nontender, presence  of abdominal hernia, BS+ Extremities: radial and distal pulses strong and equal bilaterally, no LE edema noted bilaterally Neuro: alert and appropriately conversational Psych: mood appropriate   Laboratory: Recent Labs  Lab 09/12/20 0348 09/13/20 0447 09/14/20 0431  WBC 6.6 10.1 8.6  HGB 9.3* 8.7* 9.3*  HCT 29.7* 28.0* 29.8*  PLT 229 273 289   Recent Labs  Lab 09/12/20 0348 09/13/20 0447 09/14/20 0431  NA 142 140 134*  K 4.7 4.1 3.7  CL 103 98 95*  CO2 31 33* 32  BUN 26* 25* 26*  CREATININE 0.87 0.80 0.85  CALCIUM 8.0* 8.1* 7.8*  GLUCOSE 206* 122* 105*      Imaging/Diagnostic Tests: CT ANGIO CHEST PE W OR WO CONTRAST  Result Date: 09/14/2020 CLINICAL DATA:  Chest pain, short of breath EXAM: CT ANGIOGRAPHY CHEST WITH CONTRAST TECHNIQUE: Multidetector CT imaging of the chest was performed using the standard protocol during bolus administration of intravenous contrast. Multiplanar CT image reconstructions and MIPs were obtained to evaluate the vascular anatomy. CONTRAST:  132m OMNIPAQUE IOHEXOL 350 MG/ML SOLN COMPARISON:  09/14/2020, 09/03/2020 FINDINGS: Cardiovascular: This is a technically adequate evaluation of the pulmonary vasculature. No filling defects or pulmonary emboli. Heart is enlarged with prominent biventricular dilatation right greater than left. Dilation of the pulmonary artery consistent with chronic pulmonary arterial hypertension, stable. No evidence of thoracic aortic aneurysm. Diffuse atherosclerosis of the aorta is unchanged. Mediastinum/Nodes: Mediastinal and hilar adenopathy again noted, most pronounced in the subcarinal region measuring up to 22 mm in short axis. Thyroid, trachea, and esophagus are unremarkable. Lungs/Pleura: There are small bilateral pleural effusions, right greater than left, slightly increased since prior exam. Progressive bilateral perihilar airspace disease greatest in the upper lobes, which could reflect multifocal infection or edema.  There is continued nodular consolidation in the right middle lobe has been present measuring 2.7 x 1.3 cm, unchanged since prior exam. Close follow-up is warranted to assess resolution and exclude underlying neoplasm. No pneumothorax.  The central airways are patent. 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 Remainder of the upper abdomen is unremarkable. Musculoskeletal: No acute or destructive bony lesions. Reconstructed images demonstrate no additional findings. Review of the MIP images confirms the above findings. IMPRESSION: 1. No evidence of pulmonary embolus. 2. Progressive bilateral perihilar airspace disease, greatest in the upper lobes, favor multifocal infection over edema. 3. Persistent nodular consolidation within the right middle lobe. While this could reflect another focus of infection, close follow-up is warranted to exclude underlying neoplasm. 4. Bilateral pleural effusions, right greater than left, slightly increased since prior study. 5. Stable cardiomegaly. Electronically Signed   By: MRanda NgoM.D.   On: 09/14/2020 21:55   DG CHEST PORT 1 VIEW  Result Date: 09/14/2020 CLINICAL DATA:  Hypoxia. EXAM: PORTABLE CHEST 1 VIEW COMPARISON:  Single-view of the chest 09/07/2020 and 09/03/2020. CT chest 09/03/2020. PA and lateral chest 09/05/2020. FINDINGS: The patient has been extubated since the prior exam. Left IJ central venous catheter remains in place. Small bilateral pleural effusions and patchy bibasilar airspace disease do not appear markedly changed. There is cardiomegaly without edema. Aortic atherosclerosis noted. IMPRESSION: No marked change in small bilateral pleural effusions and patchy basilar airspace disease since the most recent exam. Cardiomegaly. Electronically Signed   By: TInge RiseM.D.   On: 09/14/2020  15:43    Donney Dice, DO 09/15/2020, 12:27 AM PGY-1, Gary Intern pager: [phone], text pages welcome

## 2020-09-16 DIAGNOSIS — I4891 Unspecified atrial fibrillation: Secondary | ICD-10-CM

## 2020-09-16 DIAGNOSIS — I50813 Acute on chronic right heart failure: Secondary | ICD-10-CM | POA: Diagnosis not present

## 2020-09-16 DIAGNOSIS — I4819 Other persistent atrial fibrillation: Secondary | ICD-10-CM | POA: Diagnosis not present

## 2020-09-16 DIAGNOSIS — I484 Atypical atrial flutter: Secondary | ICD-10-CM | POA: Diagnosis not present

## 2020-09-16 DIAGNOSIS — J9601 Acute respiratory failure with hypoxia: Secondary | ICD-10-CM | POA: Diagnosis not present

## 2020-09-16 HISTORY — DX: Unspecified atrial fibrillation: I48.91

## 2020-09-16 LAB — BASIC METABOLIC PANEL WITH GFR
Anion gap: 10 (ref 5–15)
BUN: 23 mg/dL (ref 8–23)
CO2: 34 mmol/L — ABNORMAL HIGH (ref 22–32)
Calcium: 7.5 mg/dL — ABNORMAL LOW (ref 8.9–10.3)
Chloride: 88 mmol/L — ABNORMAL LOW (ref 98–111)
Creatinine, Ser: 0.99 mg/dL (ref 0.61–1.24)
GFR, Estimated: >60 mL/min (ref >60)
Glucose, Bld: 111 mg/dL — ABNORMAL HIGH (ref 70–99)
Potassium: 3.4 mmol/L — ABNORMAL LOW (ref 3.5–5.1)
Sodium: 132 mmol/L — ABNORMAL LOW (ref 135–145)

## 2020-09-16 LAB — COOXEMETRY PANEL
Carboxyhemoglobin: 1 % (ref 0.5–1.5)
Methemoglobin: 0.9 % (ref 0.0–1.5)
O2 Saturation: 45.6 %
Total hemoglobin: 9.1 g/dL — ABNORMAL LOW (ref 12.0–16.0)

## 2020-09-16 LAB — CBC
HCT: 29.2 % — ABNORMAL LOW (ref 39.0–52.0)
Hemoglobin: 9.3 g/dL — ABNORMAL LOW (ref 13.0–17.0)
MCH: 29.7 pg (ref 26.0–34.0)
MCHC: 31.8 g/dL (ref 30.0–36.0)
MCV: 93.3 fL (ref 80.0–100.0)
Platelets: 334 10*3/uL (ref 150–400)
RBC: 3.13 MIL/uL — ABNORMAL LOW (ref 4.22–5.81)
RDW: 14.6 % (ref 11.5–15.5)
WBC: 10.9 10*3/uL — ABNORMAL HIGH (ref 4.0–10.5)
nRBC: 0 % (ref 0.0–0.2)

## 2020-09-16 LAB — GLUCOSE, CAPILLARY
Glucose-Capillary: 112 mg/dL — ABNORMAL HIGH (ref 70–99)
Glucose-Capillary: 168 mg/dL — ABNORMAL HIGH (ref 70–99)
Glucose-Capillary: 356 mg/dL — ABNORMAL HIGH (ref 70–99)
Glucose-Capillary: 188 mg/dL — ABNORMAL HIGH (ref 70–99)

## 2020-09-16 MED ORDER — AMIODARONE HCL 200 MG PO TABS
400.0000 mg | ORAL_TABLET | Freq: Every day | ORAL | Status: DC
  Administered 2020-09-16 – 2020-09-21 (×6): 400 mg via ORAL
  Filled 2020-09-16 (×6): qty 2

## 2020-09-16 MED ORDER — TORSEMIDE 20 MG PO TABS
60.0000 mg | ORAL_TABLET | Freq: Every day | ORAL | Status: DC
  Administered 2020-09-16 – 2020-09-18 (×3): 60 mg via ORAL
  Filled 2020-09-16 (×3): qty 3

## 2020-09-16 MED ORDER — POTASSIUM CHLORIDE CRYS ER 20 MEQ PO TBCR
20.0000 meq | EXTENDED_RELEASE_TABLET | Freq: Two times a day (BID) | ORAL | Status: DC
  Administered 2020-09-16 (×2): 20 meq via ORAL
  Filled 2020-09-16 (×2): qty 1

## 2020-09-16 NOTE — Plan of Care (Signed)
  Problem: Education: Goal: Knowledge of General Education information will improve Description: Including pain rating scale, medication(s)/side effects and non-pharmacologic comfort measures Outcome: Progressing   Problem: Health Behavior/Discharge Planning: Goal: Ability to manage health-related needs will improve Outcome: Progressing   Problem: Clinical Measurements: Goal: Ability to maintain clinical measurements within normal limits will improve Outcome: Progressing Goal: Will remain free from infection Outcome: Progressing Goal: Diagnostic test results will improve Outcome: Progressing Goal: Respiratory complications will improve Outcome: Progressing Goal: Cardiovascular complication will be avoided Outcome: Progressing   Problem: Activity: Goal: Risk for activity intolerance will decrease Outcome: Progressing   Problem: Nutrition: Goal: Adequate nutrition will be maintained Outcome: Progressing   Problem: Coping: Goal: Level of anxiety will decrease Outcome: Progressing   Problem: Elimination: Goal: Will not experience complications related to bowel motility Outcome: Progressing Goal: Will not experience complications related to urinary retention Outcome: Progressing   Problem: Pain Managment: Goal: General experience of comfort will improve Outcome: Progressing   Problem: Safety: Goal: Ability to remain free from injury will improve Outcome: Progressing   Problem: Skin Integrity: Goal: Risk for impaired skin integrity will decrease Outcome: Progressing   Problem: Education: Goal: Ability to demonstrate management of disease process will improve Outcome: Progressing Goal: Ability to verbalize understanding of medication therapies will improve Outcome: Progressing Goal: Individualized Educational Video(s) Outcome: Progressing   Problem: Activity: Goal: Capacity to carry out activities will improve Outcome: Progressing   Problem: Cardiac: Goal:  Ability to achieve and maintain adequate cardiopulmonary perfusion will improve Outcome: Progressing   Problem: Activity: Goal: Ability to tolerate increased activity will improve Outcome: Progressing   Problem: Respiratory: Goal: Ability to maintain a clear airway and adequate ventilation will improve Outcome: Progressing   Problem: Role Relationship: Goal: Method of communication will improve Outcome: Progressing

## 2020-09-16 NOTE — Progress Notes (Signed)
NAME:  Gary [name], MRN:  [id number], DOB:  [birth year], LOS: 12 ADMISSION DATE:  09/03/2020, CONSULTATION DATE:  09/07/2020 REFERRING MD:  Nori Riis, CHIEF COMPLAINT:  GI Bleed   History of Present Illness:  70 y/o M who presented to [facility] on 4/18 with complaints of shortness of breath. 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 Has had progressive SOB for a few months.  Admitted to [facility] 4/18 for further workup and tx, with rapidly increasing O2 requirement. R/LHC performed 4/19 revealing PAH, 100% stenosis RCA and non-obstructive lesions. Had ongoing high O2 demand, on HHFNC. His course has been c/b GIB requiring PRBC. Moved to ICU and intubated for hypoxic respiratory failure 4/22. GI consulted and performed EGD 4/22 finding short segment Barrett's esophagus, small hiatal hernia, erosive gastritis. Managed in ICU for Shock -- hemorrhagic + cardiogenic shock with cor pulmonale. Patient able to extubate 4/26 and transferred out of the ICU 4/27 back to FPTS, remaining on [facility].   4/29 PCCM reconsulted for pulmonary recommendations regarding ongoing high O2 need Pertinent  Medical History  Former smoker HTN DM Traumatic crush injury from a tree branch with multiple fractures Epistaxis requiring packing   Significant Hospital Events: Including procedures, antibiotic start and stop dates in addition to other pertinent events    4/18 admitted with SOB, echo with elevated right heart pressures, new finding of A. fib  4/19 R/L heart cath with concern for pulmonary hypertension  4/20 on 15L HFNC  4/21 hypotensive episode, responded to IVF, down to 5L O2  4/22 ABLA, to ICU, intubated, severe hypoxemia; EGD with nonbleeding duodenal ulcer  4/23 failed veletri wean  4/26 veletri weaned and pt extubated  4/27 transferred Out of ICU  4/29 PCCM re-consulted for recommendations regarding ongoing hypoxia and high oxygen demand. CCM ordering CXR, CTA chest      Interim  History / Subjective:   Oxygen needs down to 0.80, HFNC 35 L/min  Objective   Blood pressure (!) 90/51, pulse 77, temperature 98.5 F (36.9 C), temperature source Oral, resp. rate 19, height 5' 7"  (1.702 m), weight 88 kg, SpO2 (!) 88 %. CVP:  [4 mmHg-7 mmHg] 5 mmHg  FiO2 (%):  [80 %-90 %] 80 %   Intake/Output Summary (Last 24 hours) at 09/16/2020 0838 Last data filed at 09/16/2020 0730 Gross per 24 hour  Intake 1372.12 ml  Output 2200 ml  Net -827.88 ml   Filed Weights   09/14/20 0524 09/15/20 0551 09/16/20 0231  Weight: 87.3 kg 89.1 kg 88 kg    Examination: Constitutional: Comfortable elderly man, no distress Ears, nose, mouth, and throat: No stridor, strong voice, no secretions Cardiovascular: Regular, distant without a murmur.  2-3+ pretibial pitting edema Respiratory: Normal work of breathing, decreased at both bases, small breaths.  No wheeze Gastrointestinal: Nondistended, no deformity Skin: No rash Neurologic: Awake, alert, appropriate, moves all extremities and follows commands   Labs/imaging that I havepersonally reviewed  (right click and "Reselect all SmartList Selections" daily)  4/29 CXR > B effusions, patchy bibasilar infiltrates, CM  4/29 CTA chest> no PE, CM with significant RV dilation, chronic PAH, mediastinal and hilar LAD, B effusions, R>L moderate on the R, progressive perihilar infiltrates, RML nodular consolidation unclear significance (? Treated infxn).  4/29 BLE dopplers>  4/28 coox 82.8 > 49.6 4/30 normal renal fxngb 9.3   Net neg -6.4L total   Resolved Hospital Problem list     Assessment & Plan:  Acute hypoxemic respiratory failure,  multifactorial, in setting B infiltrates and effusions, severe PAH as below.  -treated for possible PNA 4/23 thru 4/27 w CTX -no evidence PE, suspect that large contributor is decompensated R heart failure. Has increased perihilar infiltrates and effusions despite diuretics, some backtracking in O2 needs since he  came off milrinone. Prostacyclin transitioned to sildenafil.  P He is on torsemide, sildenafil.  Diuresis as trailed off since he came off milrinone and prostacyclin.  May need to uptitrate his diuretics carefully as I believe he is total body volume overloaded, probably intravascularly euvolemic.  Appreciate cardiology assistance May benefit from a right thoracentesis at some point if diuresis and volume removal remain challenging On empiric bronchodilators, will need formal PFT as an outpatient Push pulmonary hygiene, mobilization  Pulmonary HTN, cor pulmonale, likely secondary to OSA, COPD, Afib. Consider also a component primary R heart dysfxn given his RCA occlusion on cath (although a collateral branch is present) P Needs outpatient PSG, PFT Will need to follow-up with pulmonary, advanced CHF clinic Anticoagulation deferred, continue amiodarone Diuresis and sildenafil as above  Will needs outpt PSG, PFT Will need outpt f/u with Pulm and Adv. CHF clinic Continue Amiodarone, anticoag deferred due to GIB Continue diuresis Continue sildenafil  DU with ABLA, h pylori P Stabilized, following CBC off anticoagulation  RML nodular infiltrate P Unclear significance, could reflect treated infection or more likely a progressive pulmonary edema pattern Will need to arrange for repeat outpatient CT chest after acute issues have been optimized to follow for interval change.    Best practice (right click and "Reselect all SmartList Selections" daily)  Diet: Dys 3 Pain/Anxiety/Delirium protocol (if indicated): No VAP protocol (if indicated): Not indicated DVT prophylaxis: PAS hose GI prophylaxis: PPI Glucose control:  lantus/SSI Central venous access:  No Arterial line: yes Foley:  yes Mobility:  bed rest   PT consulted:Yes Last date of multidisciplinary goals of care discussion: pending Code Status:  full code Disposition: SDU / progressive   Baltazar Apo, MD, PhD 09/16/2020,  8:38 AM  Pulmonary and Critical Care [phone] or if no answer before 7:00PM call [phone] For any issues after 7:00PM please call eLink [phone]

## 2020-09-16 NOTE — Progress Notes (Signed)
BP was 93/49 at 1609. Cardiac PA Daleen Snook notified regarding this matter and increasing torsemide 40 mg to 60 mg today. Patienet was asymptomatic. Continue to monitor BP at this time. Rechecked BP 109/53 at 1734. HS [employer]

## 2020-09-16 NOTE — Progress Notes (Signed)
Progress Note  Patient Name: Gary [name] Date of Encounter: 09/16/2020  [facility] Cardiologist: Elouise Munroe, MD   Subjective   Breathing about the same as yesterday.   Still in atrial flutter with controlled ventricular rate.   Similar degree of edema, but weight today is down a couple of pounds (suspect yesterday's weight recorded weight was an error).  Remains 4 pounds above his weight at the time of right heart catheterization when wedge was normal and 8 pounds above his lowest weight on this admission.  Inpatient Medications    Scheduled Meds: . sodium chloride   Intravenous Once  . arformoterol  15 mcg Nebulization BID  . atorvastatin  40 mg Oral Daily  . budesonide  0.5 mg Nebulization BID  . chlorhexidine  15 mL Mouth Rinse BID  . Chlorhexidine Gluconate Cloth  6 each Topical Daily  . feeding supplement  237 mL Oral BID BM  . insulin aspart  0-9 Units Subcutaneous TID WC  . insulin glargine  20 Units Subcutaneous Daily  . mouth rinse  15 mL Mouth Rinse BID  . mouth rinse  15 mL Mouth Rinse q12n4p  . midodrine  10 mg Oral TID WC  . pantoprazole  40 mg Oral BID  . polyethylene glycol  17 g Oral Daily  . potassium chloride  20 mEq Oral BID  . revefenacin  175 mcg Nebulization Daily  . saline  1 application Each Nare BID  . sildenafil  40 mg Oral TID  . sodium chloride flush  10-40 mL Intracatheter Q12H  . torsemide  60 mg Oral Daily   Continuous Infusions: . sodium chloride Stopped (09/10/20 1336)  . sodium chloride 10 mL/hr at 09/13/20 0900  . amiodarone 30 mg/hr (09/16/20 0800)   PRN Meds: sodium chloride, acetaminophen, ondansetron (ZOFRAN) IV, sodium chloride, sodium chloride flush   Vital Signs    Vitals:   09/15/20 2340 09/16/20 0231 09/16/20 0729 09/16/20 0905  BP: (!) 90/50 (!) 108/56 (!) 90/51   Pulse: 72 79 77 85  Resp: _0 Temp: 98 F (36.7 C) 98.7 F (37.1 C) 98.5 F (36.9 C)   TempSrc: Oral Oral Oral   SpO2:  91% (!) 88%  (!) 88%  Weight:  88 kg    Height:        Intake/Output Summary (Last 24 hours) at 09/16/2020 0942 Last data filed at 09/16/2020 0800 Gross per 24 hour  Intake 1543.85 ml  Output 2200 ml  Net -656.15 ml   Last 3 Weights 09/16/2020 09/15/2020 09/14/2020  Weight (lbs) 194 lb 0.1 oz 196 lb 6.9 oz 192 lb 7.4 oz  Weight (kg) 88 kg 89.1 kg 87.3 kg      Telemetry    Atrial flutter with variable AV block, controlled ventricular rate- Personally Reviewed  ECG    No new tracing- Personally Reviewed  Physical Exam  Appears comfortable sitting up in chair GEN: No acute distress.   Neck:  4-6 cm JVD Cardiac:  Irregular, no murmurs, rubs, or gallops.  Respiratory: Clear to auscultation bilaterally. GI: Soft, nontender, non-distended  MS:  2+ symmetrical pretibial pitting edema; No deformity. Neuro:  Nonfocal  Psych: Normal affect   Labs    High Sensitivity Troponin:   Recent Labs  Lab 09/03/20 1040 09/03/20 1433 09/03/20 1803  TROPONINIHS 98* 101* 98*      Chemistry Recent Labs  Lab 09/14/20 0431 09/15/20 0444 09/16/20 0325  NA 134* 134* 132*  K  3.7 3.6 3.4*  CL 95* 92* 88*  CO2 32 32 34*  GLUCOSE 105* 85 111*  BUN 26* 22 23  CREATININE 0.85 0.97 0.99  CALCIUM 7.8* 7.5* 7.5*  GFRNONAA >60 >60 >60  ANIONGAP _0 Hematology Recent Labs  Lab 09/14/20 0431 09/15/20 0444 09/16/20 0325  WBC 8.6 9.6 10.9*  RBC 3.09* 3.10* 3.13*  HGB 9.3* 9.3* 9.3*  HCT 29.8* 28.8* 29.2*  MCV 96.4 92.9 93.3  MCH 30.1 30.0 29.7  MCHC 31.2 32.3 31.8  RDW 14.6 14.5 14.6  PLT 289 305 334    BNPNo results for input(s): BNP, PROBNP in the last 168 hours.   DDimer No results for input(s): DDIMER in the last 168 hours.   Radiology    CT ANGIO CHEST PE W OR WO CONTRAST  Result Date: 09/14/2020 CLINICAL DATA:  Chest pain, short of breath EXAM: CT ANGIOGRAPHY CHEST WITH CONTRAST TECHNIQUE: Multidetector CT imaging of the chest was performed using the standard protocol during  bolus administration of intravenous contrast. Multiplanar CT image reconstructions and MIPs were obtained to evaluate the vascular anatomy. CONTRAST:  123m OMNIPAQUE IOHEXOL 350 MG/ML SOLN COMPARISON:  09/14/2020, 09/03/2020 FINDINGS: Cardiovascular: This is a technically adequate evaluation of the pulmonary vasculature. No filling defects or pulmonary emboli. Heart is enlarged with prominent biventricular dilatation right greater than left. Dilation of the pulmonary artery consistent with chronic pulmonary arterial hypertension, stable. No evidence of thoracic aortic aneurysm. Diffuse atherosclerosis of the aorta is unchanged. Mediastinum/Nodes: Mediastinal and hilar adenopathy again noted, most pronounced in the subcarinal region measuring up to 22 mm in short axis. Thyroid, trachea, and esophagus are unremarkable. Lungs/Pleura: There are small bilateral pleural effusions, right greater than left, slightly increased since prior exam. Progressive bilateral perihilar airspace disease greatest in the upper lobes, which could reflect multifocal infection or edema. There is continued nodular consolidation in the right middle lobe has been present measuring 2.7 x 1.3 cm, unchanged since prior exam. Close follow-up is warranted to assess resolution and exclude underlying neoplasm. No pneumothorax.  The central airways are patent. 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 Remainder of the upper abdomen is unremarkable. Musculoskeletal: No acute or destructive bony lesions. Reconstructed images demonstrate no additional findings. Review of the MIP images confirms the above findings. IMPRESSION: 1. No evidence of pulmonary embolus. 2. Progressive bilateral perihilar airspace disease, greatest in the upper lobes, favor multifocal infection over edema. 3. Persistent nodular consolidation within the right middle lobe. While this could reflect another focus of infection, close follow-up is warranted to  exclude underlying neoplasm. 4. Bilateral pleural effusions, right greater than left, slightly increased since prior study. 5. Stable cardiomegaly. Electronically Signed   By: MRanda NgoM.D.   On: 09/14/2020 21:55   DG CHEST PORT 1 VIEW  Result Date: 09/14/2020 CLINICAL DATA:  Hypoxia. EXAM: PORTABLE CHEST 1 VIEW COMPARISON:  Single-view of the chest 09/07/2020 and 09/03/2020. CT chest 09/03/2020. PA and lateral chest 09/05/2020. FINDINGS: The patient has been extubated since the prior exam. Left IJ central venous catheter remains in place. Small bilateral pleural effusions and patchy bibasilar airspace disease do not appear markedly changed. There is cardiomegaly without edema. Aortic atherosclerosis noted. IMPRESSION: No marked change in small bilateral pleural effusions and patchy basilar airspace disease since the most recent exam. Cardiomegaly. Electronically Signed   By: TInge RiseM.D.   On: 09/14/2020 15:43    Cardiac Studies   Cardiac catheterization 09/05/2018.  Dist RCA lesion is 100% stenosed.  Ost LAD to Mid LAD lesion is 20% stenosed.  Mid LAD lesion is 40% stenosed.  Prox RCA lesion is 40% stenosed.  Findings:  Ao = 95/60 (76) LV = 95/14  RA = 12 RV = 60/13 PA = 66/31 (46) PCW = 13 Fick cardiac output/index = 4.1/2.1 PVR = 7.8 WU Ao sat = 99% PA sat =63%, 65%  Assessment: 1. 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 There is a large RV marginal branch that is widely patent 2. EF 60-65% 3. Moderate PAH with elevated PVR and moderately reduced cardiac output  Echocardiogram 09/03/2020  1. There is severe RV enlargement with severely depressed RV systolic function and moderately elevated PASP. The interventricular septum is flattened in systole consistent with RV pressure overload. Constellation of findings highly concerning for pulmonary embolism. Cardiology team aware and CTA pending.  2.  Left ventricular ejection fraction, by estimation, is 55 to 60%. The left ventricle has normal function. Wall motion difficult to assess due to poor visualization of the LV endocardium but appears grossly normal. There is mild concentric left ventricular hypertrophy. Diastolic function is indeterminant due to atrial fibrillation. There is the interventricular septum is flattened in systole, consistent with right ventricular pressure overload.  3. Right ventricular systolic function is severely reduced. The right ventricular size is severely enlarged. There is moderately elevated pulmonary artery systolic pressure. The estimated right ventricular  systolic pressure is 80.9 mmHg.  4. Right atrial size was moderately dilated.  5. The mitral valve is grossly normal. Mild mitral valve regurgitation.  6. Tricuspid valve regurgitation is moderate.  7. The aortic valve is tricuspid. There is moderate calcification of the aortic valve. There is moderate thickening of the aortic valve. Aortic valve regurgitation is trivial. Mild to moderate aortic valve  sclerosis/calcification is present, without any evidence of aortic stenosis.  8. The inferior vena cava is dilated in size with <50% respiratory variability, suggesting right atrial pressure of 15 mmHg.   Comparison(s): No prior Echocardiogram.   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  Successfully extubated 04 26, still requiring high flow oxygen.  Assessment & Plan     1.  Acute hypoxemic respiratory failure Cardiac catheterization does not support left  heart failure, has moderate PAH, dominant abnormality appears to be severe intrinsic lung disease. Extubated, still with high oxygen requirements.  2. PAH withcor pulmonale/right heart failure - CT chest 4/22: No PE or ILD - Echo LVEF 60% severe RV dilation and HK - Cath with moderate PAH.PA = 66/31 (46) PCW = 13 Fick = 4.1/2.1 PVR = 7.8 WU - Suspect this is WHO Group 3 PAH due to hypoxic lung disease/OHS/OSA - Plan PFTs(when better compensated)and will need outpatient sleep study - Note nominally abnormal ANA, with borderline elevation in chromatin antibody, but all other serologies negative.  ESR only mildly abnormal at 44, likely related to anemia. - On sildenafil 40 mg tid.  - Weight has reportedly increased 4 pounds  since he had right heart catheterization which showed PCW 13 and RAP 12.  BP is borderline low, renal parameters are normal. -Torsemide increased to  60 mg once daily starting today. Added KCl supplement.  3. Paroxysmal AF/atrial flutter - has organized to flutter, well rate controlled. Transition to PO amiodarone. - No AC with recent large GI bleed - consider DCCV after 3 weeks of anticoagulation (plan to start in about 2 weeks)  4. CAD - Asymptomatic single-vessel disease with chronic total occlusion of the right coronary artery.  On statin.  Not receiving aspirin due to GI bleed.  5. R lung mass - will need CT f/u  6. GI bleed - hgb nadir 6.7. now stable at 9.3 - Eliquis off - CT negative for RP bleed - On Protonix gtt - EGD with 2 ulcers not acutely bleeding.  -Would not restart AC for 2 weeks to allow GI healing - Biopsy back for H pylori. Treatment per GI. Would favor Rx if possible      For questions or updates, please contact [place] Please consult [email or web address] for contact info under        Signed, Sanda Klein, MD  09/16/2020, 9:42 AM

## 2020-09-16 NOTE — Progress Notes (Signed)
Family Medicine Teaching Service Daily Progress Note Intern Pager: [phone]  Patient name: Gary [name] Medical record number: [id number] Date of birth: [birth year] Age: 70 y.o. Gender: male  Primary Care Provider: Jalene Mullet, PA-C Consultants: Cardiology, Heart Failure, Pulmonology, GI  Code Status: FULL  Pt Overview and Major Events to Date:  4/18 admitted 4/19 right/left heart cath with concern for pulmonary HTN 4/22 2u pRBC, intubated, EGD (nonbleeding duodenal ulcers), transferred to ICU 4/26 extubated, 1u pRBC 4/28 FPTS resumed care  Assessment and Plan: Gary [name] a 70 y.o.malewho presents with acute hypoxemic respiratory failure found to have pulmonary HTN. PMHx significant for: HTN, T2DM, HLD, depression.  Acute Hypoxemic Respiratory Failure 2/2 PAH with cor pulmonale On 35L HFNC, FiO2 80% saturating 86-88% while in room. Reports that breathing is improved.  -Cardiology following; appreciate recommendations  -Increase Torsemide to 60 mg daily   -20 mEq KCl BID -Pulmonology following; appreciation recommendations  -Continue Sildenafil 40 mg TID  -Continue LABA/LAMA/ICS  -Wean O2 as tolerated to maintain sats 88-92%  -Outpatient PFTs and sleep study  -Pulmonary hygiene, mobilization  -May benefit from right thoracentesis if diuresis  challenging -Avoid BiPAP given hx of significant epistaxis  -Repeat CT chest outpatient to follow nodular consolidation -Arformoterol nebulizer BID  -Pulmicort nebulizer BID  -Revefenacin nebulizer 175 mcg daily  -Nasal saline spray PRN congestion  Paroxysmal AF/Atrial Flutter Rate controlled, intermittent atrial flutter but RRR on examination. Previously on Amiodarone drip, now transitioned to PO.  -Cardiology following, appreciate care and consultation  -Transition to PO amiodarone 400 mg daily   -No AC due to recent GI bleed   -Start anticoagulation in 2 weeks, consider DCCV after 3 weeks of anticoagulation  H. Pylori   Duodenal Ulcers EGD biopsy notable for H. Pylori. Patient was found to have non-bleeding duodenal ulcers on EGD. Melanotic stools today, per RN. Reassuring that his Hgb has remained stable. -Discuss with GI tomorrow regarding plan for treatment  -Will need outpatient TOC after completion of treatment   -CBC daily  -Protonix 40 mg BID   Hypertension with recent Hypotension Holding home anti-hypertensive's. BP ranging 90-108/50-78. -Continue Midodrine 10 mg TID  -Monitor BP's  T2DM -Continue Lantus 20 units daily -sSSI with meals -Monitor CBG's AC/qHS  CAD On statin. Holding ASA given recent GIB.  -Continue Atorvastatin 40 mg daily.   FEN/GI: Diet Dysphagia 3  PPx: SCD's   Status is: Inpatient  Remains inpatient appropriate because:Inpatient level of care appropriate due to severity of illness   Dispo: The patient is from: Home              Anticipated d/c is to: CIR              Patient currently is not medically stable to d/c.   Difficult to place patient No   Subjective:  Patient is accompanied by his daughter this morning. States that he feels well and that his breathing is improving. His daughter notes that he has a history of a severe nose bleed and since then he has been chronically breathing through his mouth. At times he needs to be reminded to breath through his nose. RN reports that patient had dark and melanotic stools- feels it is likely from old blood.   Objective: Temp:  [97.6 F (36.4 C)-98.7 F (37.1 C)] 98.5 F (36.9 C) (05/01 0729) Pulse Rate:  [70-79] 77 (05/01 0729) Resp:  [14-23] 19 (05/01 0729) BP: (90-108)/(50-64) 90/51 (05/01 0729) SpO2:  [88 %-100 %] 88 % (  05/01 0729) FiO2 (%):  [80 %-90 %] 80 % (05/01 0729) Weight:  [88 kg] 88 kg (05/01 0231) Physical Exam: General: Awake, alert, pleasant, in no distress Cardiovascular: RRR Respiratory: on 35L HFNC, decreased air movement in all fields, fine crackles appreciated at b/l bases, without  accessory muscle use Extremities: 2+ pitting edema b/l lower extremities   Laboratory: Recent Labs  Lab 09/14/20 0431 09/15/20 0444 09/16/20 0325  WBC 8.6 9.6 10.9*  HGB 9.3* 9.3* 9.3*  HCT 29.8* 28.8* 29.2*  PLT 289 305 334   Recent Labs  Lab 09/14/20 0431 09/15/20 0444 09/16/20 0325  NA 134* 134* 132*  K 3.7 3.6 3.4*  CL 95* 92* 88*  CO2 32 32 34*  BUN 26* 22 23  CREATININE 0.85 0.97 0.99  CALCIUM 7.8* 7.5* 7.5*  GLUCOSE 105* 85 111*    Imaging/Diagnostic Tests: None new.   Gary Settler, DO 09/16/2020, 8:44 AM PGY-1, [place] Intern pager: [phone], text pages welcome

## 2020-09-17 ENCOUNTER — Inpatient Hospital Stay (HOSPITAL_COMMUNITY): Payer: Medicare Other

## 2020-09-17 ENCOUNTER — Other Ambulatory Visit (HOSPITAL_COMMUNITY): Payer: Self-pay

## 2020-09-17 DIAGNOSIS — R0902 Hypoxemia: Secondary | ICD-10-CM | POA: Diagnosis not present

## 2020-09-17 DIAGNOSIS — J9601 Acute respiratory failure with hypoxia: Secondary | ICD-10-CM | POA: Diagnosis not present

## 2020-09-17 LAB — COOXEMETRY PANEL
Carboxyhemoglobin: 1.4 % (ref 0.5–1.5)
Methemoglobin: 1 % (ref 0.0–1.5)
O2 Saturation: 54.3 %
Total hemoglobin: 7 g/dL — ABNORMAL LOW (ref 12.0–16.0)

## 2020-09-17 LAB — CBC WITH DIFFERENTIAL/PLATELET
Abs Immature Granulocytes: 0.05 10*3/uL (ref 0.00–0.07)
Basophils Absolute: 0 10*3/uL (ref 0.0–0.1)
Basophils Relative: 0 %
Eosinophils Absolute: 0.2 10*3/uL (ref 0.0–0.5)
Eosinophils Relative: 2 %
HCT: 27 % — ABNORMAL LOW (ref 39.0–52.0)
Hemoglobin: 8.6 g/dL — ABNORMAL LOW (ref 13.0–17.0)
Immature Granulocytes: 1 %
Lymphocytes Relative: 9 %
Lymphs Abs: 0.9 10*3/uL (ref 0.7–4.0)
MCH: 29.5 pg (ref 26.0–34.0)
MCHC: 31.9 g/dL (ref 30.0–36.0)
MCV: 92.5 fL (ref 80.0–100.0)
Monocytes Absolute: 0.6 10*3/uL (ref 0.1–1.0)
Monocytes Relative: 6 %
Neutro Abs: 8.7 10*3/uL — ABNORMAL HIGH (ref 1.7–7.7)
Neutrophils Relative %: 82 %
Platelets: 358 10*3/uL (ref 150–400)
RBC: 2.92 MIL/uL — ABNORMAL LOW (ref 4.22–5.81)
RDW: 14.6 % (ref 11.5–15.5)
WBC: 10.5 10*3/uL (ref 4.0–10.5)
nRBC: 0 % (ref 0.0–0.2)

## 2020-09-17 LAB — GLUCOSE, CAPILLARY
Glucose-Capillary: 120 mg/dL — ABNORMAL HIGH (ref 70–99)
Glucose-Capillary: 196 mg/dL — ABNORMAL HIGH (ref 70–99)
Glucose-Capillary: 244 mg/dL — ABNORMAL HIGH (ref 70–99)
Glucose-Capillary: 193 mg/dL — ABNORMAL HIGH (ref 70–99)

## 2020-09-17 LAB — BASIC METABOLIC PANEL
Anion gap: 11 (ref 5–15)
BUN: 19 mg/dL (ref 8–23)
CO2: 34 mmol/L — ABNORMAL HIGH (ref 22–32)
Calcium: 7.6 mg/dL — ABNORMAL LOW (ref 8.9–10.3)
Chloride: 91 mmol/L — ABNORMAL LOW (ref 98–111)
Creatinine, Ser: 0.98 mg/dL (ref 0.61–1.24)
GFR, Estimated: >60 mL/min (ref >60)
Glucose, Bld: 103 mg/dL — ABNORMAL HIGH (ref 70–99)
Potassium: 3.4 mmol/L — ABNORMAL LOW (ref 3.5–5.1)
Sodium: 136 mmol/L (ref 135–145)

## 2020-09-17 MED ORDER — AMOXICILLIN 500 MG PO CAPS
1000.0000 mg | ORAL_CAPSULE | Freq: Two times a day (BID) | ORAL | Status: AC
  Administered 2020-09-17 – 2020-09-30 (×28): 1000 mg via ORAL
  Filled 2020-09-17 (×28): qty 2

## 2020-09-17 MED ORDER — METFORMIN HCL 500 MG PO TABS
1000.0000 mg | ORAL_TABLET | Freq: Two times a day (BID) | ORAL | Status: DC
  Administered 2020-09-17 – 2020-09-24 (×15): 1000 mg via ORAL
  Filled 2020-09-17 (×16): qty 2

## 2020-09-17 MED ORDER — EMPAGLIFLOZIN 10 MG PO TABS
10.0000 mg | ORAL_TABLET | Freq: Every day | ORAL | Status: DC
  Administered 2020-09-18 – 2020-10-02 (×14): 10 mg via ORAL
  Filled 2020-09-17 (×14): qty 1

## 2020-09-17 MED ORDER — POTASSIUM CHLORIDE CRYS ER 20 MEQ PO TBCR
40.0000 meq | EXTENDED_RELEASE_TABLET | Freq: Two times a day (BID) | ORAL | Status: DC
  Administered 2020-09-17 – 2020-09-18 (×4): 40 meq via ORAL
  Filled 2020-09-17 (×4): qty 2

## 2020-09-17 MED ORDER — GUAIFENESIN-DM 100-10 MG/5ML PO SYRP
5.0000 mL | ORAL_SOLUTION | ORAL | Status: DC | PRN
  Administered 2020-09-17: 5 mL via ORAL
  Filled 2020-09-17: qty 5

## 2020-09-17 MED ORDER — INSULIN GLARGINE 100 UNIT/ML ~~LOC~~ SOLN
10.0000 [IU] | Freq: Every day | SUBCUTANEOUS | Status: DC
  Administered 2020-09-18 – 2020-09-21 (×4): 10 [IU] via SUBCUTANEOUS
  Filled 2020-09-17 (×4): qty 0.1

## 2020-09-17 MED ORDER — CLARITHROMYCIN 500 MG PO TABS
500.0000 mg | ORAL_TABLET | Freq: Two times a day (BID) | ORAL | Status: AC
  Administered 2020-09-17 – 2020-09-30 (×28): 500 mg via ORAL
  Filled 2020-09-17 (×29): qty 1

## 2020-09-17 NOTE — Progress Notes (Signed)
NAME:  Shahid Flori, MRN:  [id number], DOB:  08/11/1950, LOS: 63 ADMISSION DATE:  09/03/2020, CONSULTATION DATE:  09/07/2020 REFERRING MD:  Nori Riis, CHIEF COMPLAINT:  GI Bleed   History of Present Illness:  70 y/o M who presented to [facility] on 4/18 with complaints of shortness of breath. 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 Has had progressive SOB for a few months.  Admitted to [facility] 4/18 for further workup and tx, with rapidly increasing O2 requirement. R/LHC performed 4/19 revealing PAH, 100% stenosis RCA and non-obstructive lesions. Had ongoing high O2 demand, on HHFNC. His course has been c/b GIB requiring PRBC. Moved to ICU and intubated for hypoxic respiratory failure 4/22. GI consulted and performed EGD 4/22 finding short segment Barrett's esophagus, small hiatal hernia, erosive gastritis. Managed in ICU for Shock -- hemorrhagic + cardiogenic shock with cor pulmonale. Patient able to extubate 4/26 and transferred out of the ICU 4/27 back to FPTS, remaining on [facility].   4/29 PCCM reconsulted for pulmonary recommendations regarding ongoing high O2 need Pertinent  Medical History  Former smoker HTN DM Traumatic crush injury from a tree branch with multiple fractures Epistaxis requiring packing   Significant Hospital Events: Including procedures, antibiotic start and stop dates in addition to other pertinent events    4/18 admitted with SOB, echo with elevated right heart pressures, new finding of A. fib  4/19 R/L heart cath with concern for pulmonary hypertension  4/20 on 15L HFNC  4/21 hypotensive episode, responded to IVF, down to 5L O2  4/22 ABLA, to ICU, intubated, severe hypoxemia; EGD with nonbleeding duodenal ulcer  4/23 failed veletri wean  4/26 veletri weaned and pt extubated  4/27 transferred Out of ICU  4/29 PCCM re-consulted for recommendations regarding ongoing hypoxia and high oxygen demand. CCM ordering CXR, CTA chest      Interim  History / Subjective:   Patient remains on HFNC FiO2 1 and Flow of 30L.   He is feeling better this morning. He has been wearing CPAP at night but did not wear it last night per RT.   He worked with OT this morning but was limited due to high O2 requirements.  Objective   Blood pressure (!) 98/54, pulse 72, temperature 99 F (37.2 C), temperature source Axillary, resp. rate 20, height 5' 7"  (1.702 m), weight 87.8 kg, SpO2 90 %. CVP:  [0 mmHg-5 mmHg] 0 mmHg  FiO2 (%):  [70 %-100 %] 100 %   Intake/Output Summary (Last 24 hours) at 09/17/2020 1000 Last data filed at 09/17/2020 0933 Gross per 24 hour  Intake 1065.68 ml  Output 1950 ml  Net -884.32 ml   Filed Weights   09/15/20 0551 09/16/20 0231 09/17/20 0500  Weight: 89.1 kg 88 kg 87.8 kg    Examination: Constitutional:  elderly man, no distress, sitting up in chair. HEENT: [place]/AT, sclera anicteric, moist mucous membranes Cardiovascular: RRR, no murmurs Respiratory: Normal work of breathing, diminished breath sounds at both bases. Shallow breaths. No wheezes or rhonchi. Abdomen: soft, non-tender, non-distended, bowel sounds + Skin: No rash Neurologic: Awake, alert, appropriate, moves all extremities and follows commands   Labs/imaging that I have personally reviewed  (right click and "Reselect all SmartList Selections" daily)  4/29 CXR > B effusions, patchy bibasilar infiltrates, CM  4/29 CTA chest> no PE, CM with significant RV dilation, chronic PAH, mediastinal and hilar LAD, B effusions, R>L moderate on the R, progressive perihilar infiltrates, RML nodular consolidation unclear significance (?  Treated infxn).     Resolved Hospital Problem list     Assessment & Plan:  Acute hypoxemic respiratory failure, multifactorial, in setting Bilateral infiltrates and effusions, severe PAH as below.  -treated for possible PNA 4/23 thru 4/27 w CTX -no evidence PE, suspect that large contributor is decompensated R heart failure. Has  increased perihilar infiltrates and effusions despite diuretics, some backtracking in O2 needs since he came off milrinone. Prostacyclin transitioned to sildenafil on 4/26.  P - Continue HFNC and NIV support nightly - He is on torsemide and sildenafil - Will consider a bubble study to evaluate for shunt physiology. Heart cath less concerning for this but he continues to have high degree of oxygen needs.  - Will perform bedside US today to evaluate the pleural effusions, will discuss thoracentesis if the effusions are accessible.  - On empiric bronchodilators, will need formal PFT as an outpatient - Push pulmonary hygiene, mobilization  Pulmonary HTN, cor pulmonale, likely secondary to OSA, COPD, Afib. Consider also a component primary R heart dysfxn given his RCA occlusion on cath (although a collateral branch is present) P - Needs outpatient PSG, PFT - Will need to follow-up with pulmonary, advanced CHF clinic - Anticoagulation deferred, continue amiodarone - Diuresis and sildenafil as above  Acute Upper GI Bleed Duodenal Ulcer, Erosive Gastritis, h pylori+ P - Stabilized, following CBC off anticoagulation - Treatment of H. Pylori per GI  RML nodular infiltrate P - Unclear significance, could reflect treated infection or more likely a progressive pulmonary edema pattern - Will need to arrange for repeat outpatient CT chest after acute issues have been optimized to follow for interval change.    Best practice (right click and "Reselect all SmartList Selections" daily)  Diet: Dys 3 Pain/Anxiety/Delirium protocol (if indicated): No VAP protocol (if indicated): Not indicated DVT prophylaxis: PAS hose GI prophylaxis: PPI Glucose control:  lantus/SSI Central venous access:  No Arterial line: yes Foley:  yes Mobility:  OOB   PT consulted:Yes Last date of multidisciplinary goals of care discussion: pending Code Status:  full code Disposition: SDU / progressive   Freda Jackson, MD [place] Office: [phone]   See Amion for personal pager PCCM on call pager [phone] until 7pm. Please call [place] 7p-7a. [phone]

## 2020-09-17 NOTE — Progress Notes (Signed)
Pt placed on cpap for the night. °

## 2020-09-17 NOTE — Discharge Instructions (Signed)
Atrial Fibrillation  Atrial fibrillation is a type of irregular or rapid heartbeat (arrhythmia). In atrial fibrillation, the top part of the heart (atria) beats in an irregular pattern. This makes the heart unable to pump blood normally and effectively. The goal of treatment is to prevent blood clots from forming, control your heart rate, or restore your heartbeat to a normal rhythm. If this condition is not treated, it can cause serious problems, such as a weakened heart muscle (cardiomyopathy) or a stroke. What are the causes? This condition is often caused by medical conditions that damage the heart's electrical system. These include:  High blood pressure (hypertension). This is the most common cause.  Certain heart problems or conditions, such as heart failure, coronary artery disease, heart valve problems, or heart surgery.  Diabetes.  Overactive thyroid (hyperthyroidism).  Obesity.  Chronic kidney disease. In some cases, the cause of this condition is not known. What increases the risk? This condition is more likely to develop in:  Older people.  People who smoke.  Athletes who do endurance exercise.  People who have a family history of atrial fibrillation.  Men.  People who use drugs.  People who drink a lot of alcohol.  People who have lung conditions, such as emphysema, pneumonia, or COPD.  People who have obstructive sleep apnea. What are the signs or symptoms? Symptoms of this condition include:  A feeling that your heart is racing or beating irregularly.  Discomfort or pain in your chest.  Shortness of breath.  Sudden light-headedness or weakness.  Tiring easily during exercise or activity.  Fatigue.  Syncope (fainting).  Sweating. In some cases, there are no symptoms. How is this diagnosed? Your health care provider may detect atrial fibrillation when taking your pulse. If detected, this condition may be diagnosed with:  An  electrocardiogram (ECG) to check electrical signals of the heart.  An ambulatory cardiac monitor to record your heart's activity for a few days.  A transthoracic echocardiogram (TTE) to create pictures of your heart.  A transesophageal echocardiogram (TEE) to create even closer pictures of your heart.  A stress test to check your blood supply while you exercise.  Imaging tests, such as a CT scan or chest X-ray.  Blood tests. How is this treated? Treatment depends on underlying conditions and how you feel when you experience atrial fibrillation. This condition may be treated with:  Medicines to prevent blood clots or to treat heart rate or heart rhythm problems.  Electrical cardioversion to reset the heart's rhythm.  A pacemaker to correct abnormal heart rhythm.  Ablation to remove the heart tissue that sends abnormal signals.  Left atrial appendage closure to seal the area where blood clots can form. In some cases, underlying conditions will be treated. Follow these instructions at home: Medicines  Take over-the counter and prescription medicines only as told by your health care provider.  Do not take any new medicines without talking to your health care provider.  If you are taking blood thinners: ? Talk with your health care provider before you take any medicines that contain aspirin or NSAIDs, such as ibuprofen. These medicines increase your risk for dangerous bleeding. ? Take your medicine exactly as told, at the same time every day. ? Avoid activities that could cause injury or bruising, and follow instructions about how to prevent falls. ? Wear a medical alert bracelet or carry a card that lists what medicines you take. Lifestyle  Do not use any products that contain nicotine  or tobacco, such as cigarettes, e-cigarettes, and chewing tobacco. If you need help quitting, ask your health care provider.  Eat heart-healthy foods. Talk with a dietitian to make an eating plan  that is right for you.  Exercise regularly as told by your health care provider.  Do not drink alcohol.  Lose weight if you are overweight.  Do not use drugs, including cannabis.      General instructions  If you have obstructive sleep apnea, manage your condition as told by your health care provider.  Do not use diet pills unless your health care provider approves. Diet pills can make heart problems worse.  Keep all follow-up visits as told by your health care provider. This is important. Contact a health care provider if you:  Notice a change in the rate, rhythm, or strength of your heartbeat.  Are taking a blood thinner and you notice more bruising.  Tire more easily when you exercise or do heavy work.  Have a sudden change in weight. Get help right away if you have:  Chest pain, abdominal pain, sweating, or weakness.  Trouble breathing.  Side effects of blood thinners, such as blood in your vomit, stool, or urine, or bleeding that cannot stop.  Any symptoms of a stroke. "BE FAST" is an easy way to remember the main warning signs of a stroke: ? B - Balance. Signs are dizziness, sudden trouble walking, or loss of balance. ? E - Eyes. Signs are trouble seeing or a sudden change in vision. ? F - Face. Signs are sudden weakness or numbness of the face, or the face or eyelid drooping on one side. ? A - Arms. Signs are weakness or numbness in an arm. This happens suddenly and usually on one side of the body. ? S - Speech. Signs are sudden trouble speaking, slurred speech, or trouble understanding what people say. ? T - Time. Time to call emergency services. Write down what time symptoms started.  Other signs of a stroke, such as: ? A sudden, severe headache with no known cause. ? Nausea or vomiting. ? Seizure. These symptoms may represent a serious problem that is an emergency. Do not wait to see if the symptoms will go away. Get medical help right away. Call your local  emergency services (911 in the [place] Do not drive yourself to the hospital.   Summary  Atrial fibrillation is a type of irregular or rapid heartbeat (arrhythmia).  Symptoms include a feeling that your heart is beating fast or irregularly.  You may be given medicines to prevent blood clots or to treat heart rate or heart rhythm problems.  Get help right away if you have signs or symptoms of a stroke.  Get help right away if you cannot catch your breath or have chest pain or pressure. This information is not intended to replace advice given to you by your health care provider. Make sure you discuss any questions you have with your health care provider. Document Revised: 10/27/2018 Document Reviewed: 10/27/2018 Elsevier Patient Education  2021 [employer].  ================================================================   Information on my medicine - ELIQUIS (apixaban)  This medication education was reviewed with me or my healthcare representative as part of my discharge preparation.    Why was Eliquis prescribed for you? Eliquis was prescribed for you to reduce the risk of a blood clot forming that can cause a stroke if you have a medical condition called atrial fibrillation (a type of irregular heartbeat).  What do You  need to know about Eliquis ? Take your Eliquis TWICE DAILY - one tablet in the morning and one tablet in the evening with or without food. If you have difficulty swallowing the tablet whole please discuss with your pharmacist how to take the medication safely.  Take Eliquis exactly as prescribed by your doctor and DO NOT stop taking Eliquis without talking to the doctor who prescribed the medication.  Stopping may increase your risk of developing a stroke.  Refill your prescription before you run out.  After discharge, you should have regular check-up appointments with your healthcare provider that is prescribing your Eliquis.  In the future your dose may need  to be changed if your kidney function or weight changes by a significant amount or as you get older.  What do you do if you miss a dose? If you miss a dose, take it as soon as you remember on the same day and resume taking twice daily.  Do not take more than one dose of ELIQUIS at the same time to make up a missed dose.  Important Safety Information A possible side effect of Eliquis is bleeding. You should call your healthcare provider right away if you experience any of the following: ? Bleeding from an injury or your nose that does not stop. ? Unusual colored urine (red or dark brown) or unusual colored stools (red or black). ? Unusual bruising for unknown reasons. ? A serious fall or if you hit your head (even if there is no bleeding).  Some medicines may interact with Eliquis and might increase your risk of bleeding or clotting while on Eliquis. To help avoid this, consult your healthcare provider or pharmacist prior to using any new prescription or non-prescription medications, including herbals, vitamins, non-steroidal anti-inflammatory drugs (NSAIDs) and supplements.  This website has more information on Eliquis (apixaban): http://www.eliquis.com/eliquis/home

## 2020-09-17 NOTE — Progress Notes (Addendum)
Family Medicine Teaching Service Daily Progress Note Intern Pager: [phone]  Patient name: Gary [name] Medical record number: [id number] Date of birth: 06-25-1950 Age: 70 y.o. 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 PMHx significant for: HTN, T2DM, HLD, depression.  Acute Hypoxemic Respiratory Failure 2/2 PAH with cor pulmonale Briefly able to be weaned down to 25L HFNC at 70% FiO2 yesterday, now back to 30L at 100% FiO2, saturating 90%. Torsemide increased yesterday, with 1550cc UOP yesterday. Total I/O since admission -6.7L.  -Cardiology following; appreciate recommendations  -Continue Torsemide 60 mg daily   -Try to avoid long-term Amio if possible  -DC-CV not possible without anticoagulation  -Pulmonology following; appreciation recommendations  -Chest X-ray without evidence of pleural effusions  -Consider bubble study to evaluate for shunt physiology              -Continue Sildenafil 40 mg TID             -Continue LABA/LAMA/ICS             -Wean O2 as tolerated to maintain sats 88-92%             -Outpatient PFTs and sleep study             -Pulmonary hygiene, mobilization             -May benefit from right thoracentesis if diuresis challenging  Paroxysmal Atrial Fibrillation/Atrial Flutter Rate controlled. Transitioned to PO Amiodarone yesterday.  -Cardiology following, appreciate recommendations.  -Holding AC due to recent GIB requiring transfusions -Continue Amiodarone 400 mg daily   H. Pylori  Duodenal  Ulcers Not currently on treatment. Hgb 9.3>8.6. Discussed with GI, do not recommend any specific treatment. Will start triple therapy.  -Triple therapy: Amoxicillin 1g BID, Clarithromycin 500 mg BID, Protonix 40 mg BID x14 days -Will need TOC after treatment  CAD -Atorvastatin 40 mg daily -Holding ASA in setting of recent GIB and duodenal ulcers  Type 2 DM: Chronic, stable CBG's 103-356 in last 24 hours. Requiring 11 units of SSI -Decrease Lantus to 10U daily -D/c sSSI  -Monitor CBG's -Start metformin 1000 mg BID -Start 10 mg Empagliflozin -Hold Glipizide   Hypertension with recent Hypotension BP's ranging 91-109/49-78. -Continue Midodrine 10 mg TID  -Monitor BP's  FEN/GI: Dysphagia 3 PPx: SCDs   Status is: Inpatient  Remains inpatient appropriate because:Ongoing diagnostic testing needed not appropriate for outpatient work up and Inpatient level of care appropriate due to severity of illness   Dispo: The patient is from: Home              Anticipated d/c is to: LTAC              Patient currently is not medically stable to d/c.   Difficult to place patient No   Subjective:  Patient feels well this AM, states that he slept well last night. He feels that his breathing is improved. He had a bowel movement this AM. He has no complaints.   Objective: Temp:  [97.5 F (36.4 C)-98.2 F (36.8 C)] 97.9 F (36.6 C) (05/01 2239)  Pulse Rate:  [71-118] 118 (05/01 2239) Resp:  [19-27] 20 (05/01 2239) BP: (91-109)/(49-78) 91/69 (05/02 0400) SpO2:  [85 %-91 %] 90 % (05/01 2239) FiO2 (%):  [70 %-100 %] 100 % (05/02 0400) Weight:  [87.8 kg] 87.8 kg (05/02 0500) Physical Exam: General: Awake, alert, in no distress, pleasant, conversant Cardiovascular: RRR without murmur Respiratory: Diminished breath sounds in all bases, on HFNC at 30L, without accessory muscle use no wheezing/rhonchi, faint crackles at bases Extremities: 2+ edema b/l ankles   Laboratory: Recent Labs  Lab  09/15/20 0444 09/16/20 0325 09/17/20 0510  WBC 9.6 10.9* 10.5  HGB 9.3* 9.3* 8.6*  HCT 28.8* 29.2* 27.0*  PLT 305 334 358   Recent Labs  Lab 09/15/20 0444 09/16/20 0325 09/17/20 0510  NA 134* 132* 136  K 3.6 3.4* 3.4*  CL 92* 88* 91*  CO2 32 34* 34*  BUN 22 23 19   CREATININE 0.97 0.99 0.98  CALCIUM 7.5* 7.5* 7.6*  GLUCOSE 85 111* 103*    Imaging/Diagnostic Tests: DG CHEST PORT 1 VIEW  Result Date: 09/17/2020 CLINICAL DATA:  Respiratory failure. EXAM: PORTABLE CHEST 1 VIEW COMPARISON:  09/14/2020 FINDINGS: Left IJ central venous catheter is stable in position. The cardiomediastinal contours are unchanged. Persistent bilateral pleural effusions. Progressive interstitial and airspace densities noted bilaterally. IMPRESSION: 1. No change in pleural effusions. 2. Worsening aeration of both lungs with progressive interstitial and airspace densities Electronically Signed   By: Kerby Moors M.D.   On: 09/17/2020 12:02    Sharion Settler, DO 09/17/2020, 7:41 AM PGY-1, [place] Intern pager: [phone], text pages welcome

## 2020-09-17 NOTE — TOC Benefit Eligibility Note (Signed)
Patient Teacher, English as a foreign language completed.    The patient is currently admitted and upon discharge could be taking Farxiga 10 mg.  The current 30 day co-pay is, $47.00.   The patient is currently admitted and upon discharge could be taking Jardiance 10 mg.  The current 30 day co-pay is, $47.00.   The patient is insured through  [place], [place] Patient Advocate Specialist [place] Team Direct Number: [phone]  Fax: [phone]

## 2020-09-17 NOTE — TOC Progression Note (Signed)
Transition of Care (TOC) - Progression Note  Heart Failure   Patient Details  Name: Gary [name] MRN: [id number] Date of Birth: 1951/01/11  Transition of Care [facility]) CM/SW [place], [place] Phone Number: 09/17/2020, 10:43 AM  Clinical Narrative:    CSW spoke with the patient and his wife at bedside and completed a very brief SDOH screening with the patient and his wife who denied having any needs at this time. The patient reported they do have a PCP and they can get to the pharmacy to pick up their medications. CSW provided the patient with social workers name, number, and position and to reach out to [place] if  other social needs arise.  TOC will continue to follow for d/c needs.        Expected Discharge Plan and Services                                                 Social Determinants of Health (SDOH) Interventions Food Insecurity Interventions: Intervention Not Indicated Financial Strain Interventions: Intervention Not Indicated Housing Interventions: Intervention Not Indicated Transportation Interventions: Intervention Not Indicated  Readmission Risk Interventions No flowsheet data found.  Kregg Cihlar, MSW, [place] Heart Failure Social Worker

## 2020-09-17 NOTE — Progress Notes (Signed)
Occupational Therapy Treatment Patient Details Name: Gary [name] MRN: [id number] DOB: 06-01-1950 Today's Date: 09/17/2020    History of present illness Patient is a 70 y/o male who presents on 09/03/20 with progressive SOB. Found to have acute respiratory failure likely due to new onset of heart failure and A-fib with RVR. CXR- Rt effusion, prominent interstitial edema/disease. s/p cardiac cath 09/04/20. 4/21 hypotension, 4/22 drop in Hgb, GIB with intubation and urgent bedside EGD on 4/22 with PT sign off.  Pt was extubated on 4/26. PT reordered on 4/27.  PMhx: HTN and DM.   OT comments  Pt making steady progress towards OT goals this session. Pt received seated in recliner agreeable to OT intervention. Pt continues to present with decreased activity tolerance, dyspnea with exertion and impaired strength. Pt currently requires MIN guard assist for x2 sit<>stands from recliner with RW. Pt able to stand a total of 5 mins ( 2 mins followed by seated rest break and 3 more minutes) for standing dynamic reaching tasks and standing LB therex. Pt also able to take ~ 2 steps forward <>backward d/t decreased length of O2. Pt on 30 L HHFNC with SpO2 >87% during session. Pt would continue to benefit from skilled occupational therapy while admitted and after d/c to address the below listed limitations in order to improve overall functional mobility and facilitate independence with BADL participation. DC plan remains appropriate, will follow acutely per POC.     Follow Up Recommendations  CIR    Equipment Recommendations  3 in 1 bedside commode;Tub/shower bench    Recommendations for Other Services      Precautions / Restrictions Precautions Precautions: Fall Precaution Comments: watch 02 (on high flow), HR Restrictions Weight Bearing Restrictions: No       Mobility Bed Mobility               General bed mobility comments: pt OOB in recliner and left up in recliner at end of session     Transfers Overall transfer level: Needs assistance Equipment used: Rolling walker (2 wheeled) Transfers: Sit to/from Stand Sit to Stand: Min guard         General transfer comment: pt completed x2 sit<>stands with minguard assist for safety and line mgmt, increased effort and time needed, pt able to take 2 steps forward<>backward d/t hook up of HHFNC    Balance Overall balance assessment: Needs assistance Sitting-balance support: No upper extremity supported;Feet supported Sitting balance-Leahy Scale: Fair Sitting balance - Comments: sitting on edge of chair unsupported with no UE support and no LOB   Standing balance support: Single extremity supported;No upper extremity supported;During functional activity Standing balance-Leahy Scale: Fair Standing balance comment: pt able to complete dynamic reaching task with no UE support with close min guard and no LOB                           ADL either performed or assessed with clinical judgement   ADL Overall ADL's : Needs assistance/impaired             Lower Body Bathing: Min guard;Sitting/lateral leans Lower Body Bathing Details (indicate cue type and reason): simulated via LB dressing from recliner     Lower Body Dressing: Min guard;Sitting/lateral leans Lower Body Dressing Details (indicate cue type and reason): to adjust socks from recliner Toilet Transfer: Min Fish farm manager Details (indicate cue type and reason): sit<>stand only         Functional mobility during  ADLs: Min guard;Rolling walker (sit<>stand only) General ADL Comments: pt continues to present with decreased activity tolerance, dyspnea with exertion and impaired strength, pt on 30 L HHFNC     Vision       Perception     Praxis      Cognition Arousal/Alertness: Awake/alert Behavior During Therapy: WFL for tasks assessed/performed Overall Cognitive Status: Within Functional Limits for tasks assessed                                           Exercises General Exercises - Lower Extremity Long Arc Quad: Both;Seated;10 reps;Strengthening Hip Flexion/Marching: Both;10 reps;Seated;Strengthening Other Exercises Other Exercises: pulling 750 on IS consistently x10 reps Other Exercises: dynamic reaching in standing with no UE support and min guard assist for safety   Shoulder Instructions       General Comments pt on 30 L HHFNC with SpO2 > 87% during session RR 31 breaths per minute.     Pertinent Vitals/ Pain       Pain Assessment: Faces Faces Pain Scale: Hurts a little bit Pain Location: rt knee Pain Descriptors / Indicators: Aching;Sore;Discomfort;Grimacing Pain Intervention(s): Monitored during session;Repositioned;Other (comment);Limited activity within patient's tolerance (encouraged pt to decrease AROM during therex as pain mgmt strategy)  Home Living                                          Prior Functioning/Environment              Frequency  Min 2X/week        Progress Toward Goals  OT Goals(current goals can now be found in the care plan section)  Progress towards OT goals: Progressing toward goals  Acute Rehab OT Goals Patient Stated Goal: to go home and be with his wife OT Goal Formulation: With patient Time For Goal Achievement: 09/27/20 Potential to Achieve Goals: Good  Plan Discharge plan remains appropriate;Frequency remains appropriate    Co-evaluation                 AM-PAC OT "6 Clicks" Daily Activity     Outcome Measure   Help from another person eating meals?: None Help from another person taking care of personal grooming?: A Little Help from another person toileting, which includes using toliet, bedpan, or urinal?: A Lot Help from another person bathing (including washing, rinsing, drying)?: A Lot Help from another person to put on and taking off regular upper body clothing?: A Little Help from another person to put  on and taking off regular lower body clothing?: A Little 6 Click Score: 17    End of Session Equipment Utilized During Treatment: Rolling walker;Oxygen;Other (comment) (30L HHFNC)  OT Visit Diagnosis: Unsteadiness on feet (R26.81);Muscle weakness (generalized) (M62.81);Pain Pain - Right/Left: Right Pain - part of body: Knee   Activity Tolerance Patient tolerated treatment well   Patient Left in chair;with call bell/phone within reach;with nursing/sitter in room   Nurse Communication Mobility status;Other (comment) (desat to 87%)        Time: 1540-0867 OT Time Calculation (min): 24 min  Charges: OT General Charges $OT Visit: 1 Visit OT Treatments $Therapeutic Activity: 23-37 mins  Gary [name]., COTA/L Acute Rehabilitation Services [phone]    Gary [name] 09/17/2020, 11:42 AM

## 2020-09-17 NOTE — Plan of Care (Signed)
  Problem: Education: Goal: Knowledge of General Education information will improve Description: Including pain rating scale, medication(s)/side effects and non-pharmacologic comfort measures Outcome: Progressing   Problem: Health Behavior/Discharge Planning: Goal: Ability to manage health-related needs will improve Outcome: Progressing   Problem: Clinical Measurements: Goal: Ability to maintain clinical measurements within normal limits will improve Outcome: Progressing Goal: Will remain free from infection Outcome: Progressing Goal: Diagnostic test results will improve Outcome: Progressing Goal: Respiratory complications will improve Outcome: Progressing Goal: Cardiovascular complication will be avoided Outcome: Progressing   Problem: Activity: Goal: Risk for activity intolerance will decrease Outcome: Progressing   Problem: Nutrition: Goal: Adequate nutrition will be maintained Outcome: Progressing   Problem: Coping: Goal: Level of anxiety will decrease Outcome: Progressing   Problem: Elimination: Goal: Will not experience complications related to bowel motility Outcome: Progressing Goal: Will not experience complications related to urinary retention Outcome: Progressing   Problem: Pain Managment: Goal: General experience of comfort will improve Outcome: Progressing   Problem: Safety: Goal: Ability to remain free from injury will improve Outcome: Progressing   Problem: Skin Integrity: Goal: Risk for impaired skin integrity will decrease Outcome: Progressing   Problem: Education: Goal: Ability to demonstrate management of disease process will improve Outcome: Progressing Goal: Ability to verbalize understanding of medication therapies will improve Outcome: Progressing Goal: Individualized Educational Video(s) Outcome: Progressing   Problem: Activity: Goal: Capacity to carry out activities will improve Outcome: Progressing   Problem: Activity: Goal:  Ability to tolerate increased activity will improve Outcome: Progressing   Problem: Respiratory: Goal: Ability to maintain a clear airway and adequate ventilation will improve Outcome: Progressing   Problem: Role Relationship: Goal: Method of communication will improve Outcome: Progressing

## 2020-09-17 NOTE — Progress Notes (Signed)
Inpatient Rehab Admissions Coordinator:   Pt's oxygen requirements remain outside capabilities of CIR.  Will continue to follow from a distance this week.  Please feel free to contact us with questions.    Shann Medal, PT, DPT Admissions Coordinator [phone] 09/17/20  9:13 AM

## 2020-09-17 NOTE — Progress Notes (Signed)
Physical Therapy Treatment Patient Details Name: Gary [name] MRN: [id number] DOB: 11/18/1950 Today's Date: 09/17/2020    History of Present Illness Patient is a 70 y/o male who presents on 09/03/20 with progressive SOB. Found to have acute respiratory failure likely due to new onset of heart failure and A-fib with RVR. CXR- Rt effusion, prominent interstitial edema/disease. s/p cardiac cath 09/04/20. 4/21 hypotension, 4/22 drop in Hgb, GIB with intubation and urgent bedside EGD on 4/22 with PT sign off.  Pt was extubated on 4/26. PT reordered on 4/27.  PMhx: HTN and DM.    PT Comments    Pt making steady progress with mobility. Continues to have limited gait distance due to decr activity tolerance and physical limitations of HHFNC tubing. Will continue to incr activity and amb distance as medical status improves.    Follow Up Recommendations  CIR     Equipment Recommendations  Rolling walker with 5" wheels;3in1 (PT)    Recommendations for Other Services       Precautions / Restrictions Precautions Precautions: Fall Precaution Comments: watch 02 (on high flow), HR Restrictions Weight Bearing Restrictions: No    Mobility  Bed Mobility               General bed mobility comments: Pt up in chair    Transfers Overall transfer level: Needs assistance Equipment used: Rolling walker (2 wheeled) Transfers: Sit to/from Stand Sit to Stand: Min assist         General transfer comment: Assist to bring hips up and for balance.  Ambulation/Gait Ambulation/Gait assistance: Min assist Gait Distance (Feet): 25 Feet (7' forward/back x 2, Repeated this a second time after rest break.) Assistive device: Rolling walker (2 wheeled) Gait Pattern/deviations: Step-through pattern;Decreased step length - right;Decreased step length - left;Antalgic;Trunk flexed Gait velocity: decreased Gait velocity interpretation: <1.31 ft/sec, indicative of household ambulator General Gait Details:  Assist for balance. Initial limp on painful rt knee that improves with incr distance. Verbal cues to stand more erect and not hold breath.  Limited distance due to heated high flow   Stairs             Wheelchair Mobility    Modified Rankin (Stroke Patients Only)       Balance Overall balance assessment: Needs assistance Sitting-balance support: No upper extremity supported;Feet supported Sitting balance-Leahy Scale: Fair Sitting balance - Comments: sitting on edge of chair unsupported with no UE support and no LOB   Standing balance support: Bilateral upper extremity supported;During functional activity Standing balance-Leahy Scale: Poor Standing balance comment: walker and min guard for static standing                            Cognition Arousal/Alertness: Awake/alert Behavior During Therapy: WFL for tasks assessed/performed Overall Cognitive Status: Within Functional Limits for tasks assessed                                        Exercises General Exercises - Lower Extremity Long Arc Quad: Both;Seated;10 reps;Strengthening Hip Flexion/Marching: Both;10 reps;Seated;Strengthening Other Exercises Other Exercises: pulling 750 on IS consistently x10 reps Other Exercises: dynamic reaching in standing with no UE support and min guard assist for safety    General Comments General comments (skin integrity, edema, etc.): Pt on 30L of HHFNC. Poor wave form on SpO2. When waveform good SpO2 >88%  Pertinent Vitals/Pain Pain Assessment: Faces Faces Pain Scale: Hurts a little bit Pain Location: rt knee Pain Descriptors / Indicators: Aching;Sore Pain Intervention(s): Monitored during session;Repositioned    Home Living                      Prior Function            PT Goals (current goals can now be found in the care plan section) Acute Rehab PT Goals Patient Stated Goal: return to independence, be able to breathe Progress  towards PT goals: Progressing toward goals    Frequency    Min 3X/week      PT Plan Current plan remains appropriate    Co-evaluation              AM-PAC PT "6 Clicks" Mobility   Outcome Measure  Help needed turning from your back to your side while in a flat bed without using bedrails?: A Little Help needed moving from lying on your back to sitting on the side of a flat bed without using bedrails?: A Little Help needed moving to and from a bed to a chair (including a wheelchair)?: A Little Help needed standing up from a chair using your arms (e.g., wheelchair or bedside chair)?: A Little Help needed to walk in hospital room?: A Little Help needed climbing 3-5 steps with a railing? : A Lot 6 Click Score: 17    End of Session Equipment Utilized During Treatment: Oxygen Activity Tolerance: Patient tolerated treatment well Patient left: in chair;with call bell/phone within reach;with family/visitor present Nurse Communication: Mobility status PT Visit Diagnosis: Muscle weakness (generalized) (M62.81);Difficulty in walking, not elsewhere classified (R26.2);Other (comment)     Time: 7371-0626 (Interupted for dinner/breakfast order x 7 minutes) PT Time Calculation (min) (ACUTE ONLY): 28 min  Charges:  $Gait Training: 8-22 mins                     [facility] Pager [phone] Office [place] 09/17/2020, 3:04 PM

## 2020-09-17 NOTE — Progress Notes (Addendum)
Patient ID: Gary [name], male   DOB: 1950-06-16, 70 y.o.   MRN: [id number]     Advanced Heart Failure Rounding Note  PCP-Cardiologist: Elouise Munroe, MD   Subjective:   Events  4/22  EGD 4/22 showed 2 ulcers in duodenum that were not actively bleeding.  On PO Protonix, Hgb 10>9.3>8.6  4/23 Intubated. Dobutamine stopped due to marked tachycardia. Started antibiotics for pneumonia. Blood CX- NGTD. Sputum with strep.  4/24 Did not tolerate fast veletri wean.  4/25 Veletri stopped. Extubated. Given 1u PRBC  5/1 Amio gtt stopped>>PO 400 mg daily   Remains in AFL, rate controlled in 70s. Hgb 9.3>>8.6.   Torsemide increased yesterday to 60 mg daily. Wt down 1 lb. CVP 7. SCr stable. K 3.4.   Remains on HFNC O2 at 30L Denies CP or SOB. Used CPAP last night.    Objective:   Weight Range: 87.8 kg Body mass index is 30.32 kg/m.   Vital Signs:   Temp:  [97.5 F (36.4 C)-98.2 F (36.8 C)] 97.9 F (36.6 C) (05/01 2239) Pulse Rate:  [71-118] 118 (05/01 2239) Resp:  [19-27] 20 (05/01 2239) BP: (91-109)/(49-78) 91/69 (05/02 0400) SpO2:  [85 %-91 %] 90 % (05/01 2239) FiO2 (%):  [70 %-100 %] 100 % (05/02 0400) Weight:  [87.8 kg] 87.8 kg (05/02 0500) Last BM Date: 09/16/20  Weight change: Filed Weights   09/15/20 0551 09/16/20 0231 09/17/20 0500  Weight: 89.1 kg 88 kg 87.8 kg    Intake/Output:   Intake/Output Summary (Last 24 hours) at 09/17/2020 0730 Last data filed at 09/16/2020 1700 Gross per 24 hour  Intake 1247.41 ml  Output 1550 ml  Net -302.59 ml      Physical Exam   PHYSICAL EXAM: CVP 7  General:  Well appearing. No respiratory difficulty HEENT: normal Neck: supple. no JVD. Carotids 2+ bilat; no bruits. No lymphadenopathy or thyromegaly appreciated. Cor: PMI nondisplaced. Irregular rhythm. No rubs, gallops or murmurs. Lungs: course bilaterally  Abdomen: soft, nontender, nondistended. No hepatosplenomegaly. No bruits or masses. Good bowel sounds. Extremities: no  cyanosis, clubbing, rash, trace bilateral ankle edema Neuro: alert & oriented x 3, cranial nerves grossly intact. moves all 4 extremities w/o difficulty. Affect pleasant.    Telemetry    AFL 70s Personally reviewed  Labs    CBC Recent Labs    09/16/20 0325 09/17/20 0510  WBC 10.9* 10.5  NEUTROABS  --  8.7*  HGB 9.3* 8.6*  HCT 29.2* 27.0*  MCV 93.3 92.5  PLT 334 992   Basic Metabolic Panel Recent Labs    09/14/20 1440 09/15/20 0444 09/16/20 0325 09/17/20 0510  NA  --    < > 132* 136  K  --    < > 3.4* 3.4*  CL  --    < > 88* 91*  CO2  --    < > 34* 34*  GLUCOSE  --    < > 111* 103*  BUN  --    < > 23 19  CREATININE  --    < > 0.99 0.98  CALCIUM  --    < > 7.5* 7.6*  MG 1.4*  --   --   --    < > = values in this interval not displayed.   Liver Function Tests No results for input(s): AST, ALT, ALKPHOS, BILITOT, PROT, ALBUMIN in the last 72 hours. No results for input(s): LIPASE, AMYLASE in the last 72 hours. Cardiac Enzymes No results for input(s): CKTOTAL,  CKMB, CKMBINDEX, TROPONINI in the last 72 hours.  BNP: BNP (last 3 results) Recent Labs    09/03/20 1433  BNP 1,202.7*    ProBNP (last 3 results) No results for input(s): PROBNP in the last 8760 hours.   D-Dimer No results for input(s): DDIMER in the last 72 hours. Hemoglobin A1C No results for input(s): HGBA1C in the last 72 hours. Fasting Lipid Panel No results for input(s): CHOL, HDL, LDLCALC, TRIG, CHOLHDL, LDLDIRECT in the last 72 hours. Thyroid Function Tests No results for input(s): TSH, T4TOTAL, T3FREE, THYROIDAB in the last 72 hours.  Invalid input(s): FREET3  Other results:   Imaging    No results found.   Medications:     Scheduled Medications: . sodium chloride   Intravenous Once  . amiodarone  400 mg Oral Daily  . arformoterol  15 mcg Nebulization BID  . atorvastatin  40 mg Oral Daily  . budesonide  0.5 mg Nebulization BID  . chlorhexidine  15 mL Mouth Rinse BID  .  Chlorhexidine Gluconate Cloth  6 each Topical Daily  . feeding supplement  237 mL Oral BID BM  . insulin aspart  0-9 Units Subcutaneous TID WC  . insulin glargine  20 Units Subcutaneous Daily  . mouth rinse  15 mL Mouth Rinse BID  . mouth rinse  15 mL Mouth Rinse q12n4p  . midodrine  10 mg Oral TID WC  . pantoprazole  40 mg Oral BID  . polyethylene glycol  17 g Oral Daily  . potassium chloride  20 mEq Oral BID  . revefenacin  175 mcg Nebulization Daily  . saline  1 application Each Nare BID  . sildenafil  40 mg Oral TID  . sodium chloride flush  10-40 mL Intracatheter Q12H  . torsemide  60 mg Oral Daily    Infusions: . sodium chloride Stopped (09/10/20 1336)  . sodium chloride 10 mL/hr at 09/13/20 0900    PRN Medications: sodium chloride, acetaminophen, guaiFENesin-dextromethorphan, ondansetron (ZOFRAN) IV, sodium chloride, sodium chloride flush    Assessment/Plan    1. Acute upper GI bleed with symptomatic anemia - hgb dropped to 6.7. Transfused.  - Eliquis off - CT negative for RP bleed - EGD with 2 ulcers not acutely bleeding.  - Hgb 9.3>>8.6 today  - Off Protonix gtt, PO 40 mg bid  - Would not restart AC for 2 weeks to allow GI healing - Biopsy back for H pylori. Treatment per GI. Would favor Rx if possible  2. PAH with cor pulmonale - CT chest 4/22: No PE or ILD - Echo LVEF 60% severe RV dilation and HK - Cath with moderate PAH. PA = 66/31 (46) PCW = 13 Fick = 4.1/2.1 PVR = 7.8 WU - Suspect this is WHO Group 3 PAH due to hypoxic lung disease/OHS/OSA - Will order PFTs (when better compensated) and will need outpatient sleep study - Serologies for completed. Note abnormal ANA, with borderline elevation in chromatin antibody, but all other serologies negative.ESR only mildly abnormal at 44, likely related to anemia.  - On sildenafil 40 mg tid.  -  O2 requirements far out of proportion to [facility] or HF. Suspect intrinsic lung disease  3. Paroxysmal AF - new onset.  Unclear duration  - Eliquis on hold d/t GI bleed - Was in NSR last week. Now back in AF x 48 hours  - No AC with recent large GI bleed - Continue SCDs.  - Continue PO amio 400 mg daily  4. CAD  -mostly non-obstructive - atorva 40 added  - No ASA with AC - No s/s angina  5. R lung mass - will need CT f/u   6. Shock - Suspect mixed hemorrhagic/cardiogenic (RV failure).   - Resolved  7. Acute hypoxemic respiratory failure - Intubated pre-EGD on 4/22.   - Extubated--> on HFNC - O2 requirements far out of proportion to [facility] or HF. Suspect intrinsic lung disease    Length of Stay: [address] PA-C  09/17/2020, 7:30 AM  Advanced Heart Failure Team Pager [phone] (M-F; 7a - 5p)  Please contact [facility] for night-coverage after hours (5p -7a ) and weekends on [email or web address]  Patient seen and examined with the above-signed Advanced Practice Provider and/or Housestaff. I personally reviewed laboratory data, imaging studies and relevant notes. I independently examined the patient and formulated the important aspects of the plan. I have edited the note to reflect any of my changes or salient points. I have personally discussed the plan with the patient and/or family.  Remains on HFNC. Sats ok. Still in AFL. Off AC due to GIB. HGb trending down slowly.   Respiratory status stable but tenuous.   General:  Sitting up in bed on HFNC HEENT: normal Neck: supple. no JVD. Carotids 2+ bilat; no bruits. No lymphadenopathy or thryomegaly appreciated. Cor: PMI nondisplaced. Irregular rate & rhythm. No rubs, gallops or murmurs. Lungs: coarse Abdomen: obese soft, nontender, nondistended. No hepatosplenomegaly. No bruits or masses. Good bowel sounds. Extremities: no cyanosis, clubbing, rash, tr edema Neuro: alert & orientedx3, cranial nerves grossly intact. moves all 4 extremities w/o difficulty. Affect pleasant  Respiratory status remains tenuous. As noted before, degree of  hypoxemia far out of proportion to HF or PAH. Continue with aggressive pulmonary toilet and mobilization.   Remains in AF. Not candidate for DC-CV without AC. Hopefully can try to resume Eliquis next week. May be candidate for [facility] closure if we can get him stable on [facility] for 1-2 months.Would avoid long-term amio use given underlying lung disease if at all possible.   Glori Bickers, MD  8:04 AM

## 2020-09-17 NOTE — Progress Notes (Signed)
  Speech Language Pathology Treatment: Dysphagia  Patient Details Name: Gary [name] MRN: [id number] DOB: [birth year] Today's Date: 09/17/2020 Time: 8676-1950 SLP Time Calculation (min) (ACUTE ONLY): 14 min  Assessment / Plan / Recommendation Clinical Impression  Pt was seen during his lunch meal, with wife present for session. They both deny any difficulties with swallowing so far, although pt also did not like the look of his mechanical soft lunch tray. His wife shares that pt has been doing a very good job of going slowly with PO intake. He seems to have been tolerating mechanical soft foods for several days now, and if he is implementing his strategies well during SLP visits and with other meals, recommend that he advance to regular solids while still maintaining aspiration precautions and taking rest breaks PRN for his respiratory status. Pt was also observed to take several pills at a time with thin liquids with a single cough noted after one round of meds. Encouraged him to take his pills one at a time if he wants to try them with thin liquids. If coughing persists, would resume taking them whole in puree. Will continue to follow.   HPI HPI: Pt is a 70 yo male presenting with progressive dyspnea. 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 He required intubation for acute GI bleed 4/22-4/26. EGD showed a large duodenal ulcer, Barrett's esophagus, small hiatal hernia. PMH includes: HTN, HLD, DM      SLP Plan  Continue with current plan of care       Recommendations  Diet recommendations: Regular;Thin liquid Liquids provided via: Cup;Straw Medication Administration: Whole meds with liquid (one at a time) Supervision: Patient able to self feed;Intermittent supervision to cue for compensatory strategies Compensations: Slow rate;Small sips/bites;Other (Comment) (take rest breaks PRN for respiratory status) Postural  Changes and/or Swallow Maneuvers: Seated upright 90 degrees                Oral Care Recommendations: Oral care BID Follow up Recommendations:  (tba) SLP Visit Diagnosis: Dysphagia, unspecified (R13.10) Plan: Continue with current plan of care       GO                Osie Bond., M.A. [place] Acute Rehabilitation Services Pager [phone] Office [phone]  09/17/2020, 12:41 PM

## 2020-09-17 NOTE — Progress Notes (Signed)
SLP Cancellation Note  Patient Details Name: Dvid Pendry MRN: [id number] DOB: 19-May-1951   Cancelled treatment:       Reason Eval/Treat Not Completed: Other (comment) Pt currently working with OT. Will f/u as able.    Osie Bond., M.A. [place] Acute Rehabilitation Services Pager [phone] Office [phone]  09/17/2020, 9:27 AM

## 2020-09-18 ENCOUNTER — Inpatient Hospital Stay (HOSPITAL_COMMUNITY): Payer: Medicare Other

## 2020-09-18 DIAGNOSIS — R0902 Hypoxemia: Secondary | ICD-10-CM | POA: Diagnosis not present

## 2020-09-18 DIAGNOSIS — J9601 Acute respiratory failure with hypoxia: Secondary | ICD-10-CM | POA: Diagnosis not present

## 2020-09-18 LAB — LACTATE DEHYDROGENASE: LDH: 250 U/L — ABNORMAL HIGH (ref 98–192)

## 2020-09-18 LAB — BODY FLUID CELL COUNT WITH DIFFERENTIAL
Eos, Fluid: 1 %
Lymphs, Fluid: 92 %
Monocyte-Macrophage-Serous Fluid: 5 % — ABNORMAL LOW (ref 50–90)
Neutrophil Count, Fluid: 2 % (ref 0–25)
Total Nucleated Cell Count, Fluid: 790 cu mm (ref 0–1000)

## 2020-09-18 LAB — CBC
HCT: 26.3 % — ABNORMAL LOW (ref 39.0–52.0)
Hemoglobin: 8.2 g/dL — ABNORMAL LOW (ref 13.0–17.0)
MCH: 29.2 pg (ref 26.0–34.0)
MCHC: 31.2 g/dL (ref 30.0–36.0)
MCV: 93.6 fL (ref 80.0–100.0)
Platelets: 381 10*3/uL (ref 150–400)
RBC: 2.81 MIL/uL — ABNORMAL LOW (ref 4.22–5.81)
RDW: 14.6 % (ref 11.5–15.5)
WBC: 9.4 10*3/uL (ref 4.0–10.5)
nRBC: 0 % (ref 0.0–0.2)

## 2020-09-18 LAB — GLUCOSE, CAPILLARY
Glucose-Capillary: 73 mg/dL (ref 70–99)
Glucose-Capillary: 201 mg/dL — ABNORMAL HIGH (ref 70–99)
Glucose-Capillary: 138 mg/dL — ABNORMAL HIGH (ref 70–99)
Glucose-Capillary: 145 mg/dL — ABNORMAL HIGH (ref 70–99)

## 2020-09-18 LAB — GLUCOSE, PLEURAL OR PERITONEAL FLUID: Glucose, Fluid: 220 mg/dL

## 2020-09-18 LAB — BASIC METABOLIC PANEL
Anion gap: 8 (ref 5–15)
BUN: 18 mg/dL (ref 8–23)
CO2: 35 mmol/L — ABNORMAL HIGH (ref 22–32)
Calcium: 7.4 mg/dL — ABNORMAL LOW (ref 8.9–10.3)
Chloride: 92 mmol/L — ABNORMAL LOW (ref 98–111)
Creatinine, Ser: 0.97 mg/dL (ref 0.61–1.24)
GFR, Estimated: >60 mL/min (ref >60)
Glucose, Bld: 70 mg/dL (ref 70–99)
Potassium: 3.8 mmol/L (ref 3.5–5.1)
Sodium: 135 mmol/L (ref 135–145)

## 2020-09-18 LAB — COOXEMETRY PANEL
Carboxyhemoglobin: 1.3 % (ref 0.5–1.5)
Methemoglobin: 0.6 % (ref 0.0–1.5)
O2 Saturation: 60.7 %
Total hemoglobin: 8.5 g/dL — ABNORMAL LOW (ref 12.0–16.0)

## 2020-09-18 LAB — LACTATE DEHYDROGENASE, PLEURAL OR PERITONEAL FLUID: LD, Fluid: 139 U/L — ABNORMAL HIGH (ref 3–23)

## 2020-09-18 LAB — PROTEIN, PLEURAL OR PERITONEAL FLUID: Total protein, fluid: <3 g/dL

## 2020-09-18 LAB — ALBUMIN, PLEURAL OR PERITONEAL FLUID: Albumin, Fluid: 1.2 g/dL

## 2020-09-18 LAB — PROTEIN, TOTAL: Total Protein: 5.7 g/dL — ABNORMAL LOW (ref 6.5–8.1)

## 2020-09-18 LAB — ALBUMIN: Albumin: 1.9 g/dL — ABNORMAL LOW (ref 3.5–5.0)

## 2020-09-18 LAB — AMYLASE, PLEURAL OR PERITONEAL FLUID: Amylase, Fluid: 20 U/L

## 2020-09-18 MED ORDER — FERROUS SULFATE 325 (65 FE) MG PO TABS
325.0000 mg | ORAL_TABLET | Freq: Every day | ORAL | Status: DC
  Administered 2020-09-19 – 2020-09-23 (×5): 325 mg via ORAL
  Filled 2020-09-18 (×6): qty 1

## 2020-09-18 MED ORDER — LIDOCAINE HCL 1 % IJ SOLN: 10.0000 mL | Freq: Once | INTRAMUSCULAR | Status: DC

## 2020-09-18 MED ORDER — LIDOCAINE HCL (PF) 1 % IJ SOLN
INTRAMUSCULAR | Status: AC
  Filled 2020-09-18: qty 5

## 2020-09-18 MED ORDER — METOLAZONE 2.5 MG PO TABS
2.5000 mg | ORAL_TABLET | Freq: Once | ORAL | Status: AC
  Administered 2020-09-18: 2.5 mg via ORAL
  Filled 2020-09-18: qty 1

## 2020-09-18 MED ORDER — POTASSIUM CHLORIDE CRYS ER 20 MEQ PO TBCR
20.0000 meq | EXTENDED_RELEASE_TABLET | Freq: Once | ORAL | Status: AC
  Administered 2020-09-18: 20 meq via ORAL
  Filled 2020-09-18: qty 1

## 2020-09-18 MED ORDER — GLUCERNA SHAKE PO LIQD
237.0000 mL | Freq: Three times a day (TID) | ORAL | Status: DC
  Administered 2020-09-18 – 2020-10-01 (×32): 237 mL via ORAL

## 2020-09-18 MED ORDER — ADULT MULTIVITAMIN W/MINERALS CH
1.0000 | ORAL_TABLET | Freq: Every day | ORAL | Status: DC
  Administered 2020-09-18 – 2020-10-02 (×14): 1 via ORAL
  Filled 2020-09-18 (×14): qty 1

## 2020-09-18 NOTE — Progress Notes (Signed)
Physical Therapy Treatment Patient Details Name: Gary [name] MRN: [id number] DOB: 01/20/1951 Today's Date: 09/18/2020    History of Present Illness Patient is a 70 y/o male who presents on 09/03/20 with progressive SOB. Found to have acute respiratory failure likely due to new onset of heart failure and A-fib with RVR. CXR- Rt effusion, prominent interstitial edema/disease. s/p cardiac cath 09/04/20. 4/21 hypotension, 4/22 drop in Hgb, GIB with intubation and urgent bedside EGD on 4/22 with PT sign off.  Pt was extubated on 4/26. PT reordered on 4/27.  PMhx: HTN and DM.    PT Comments    Pt pleasant and very willing to attempt mobility. Pt able to walk within [facility] tubing length walking 7' and 10' forward and back respectively. Pt also with 2 episodes of static standing grossly 1 min each for pericare and urinating. Pt with HEP prior to gait which pt states was helpful for loosening joints prior to gait. Wife present throughout. Pt encouraged to continue HEP and OOB throughout the day.   HR 93-101 SpO2 93% at rest with drop to 84% at lowest during gait Pt on HHFNC 90% FiO2, 30L throughout gait    Follow Up Recommendations  CIR     Equipment Recommendations  Rolling walker with 5" wheels;3in1 (PT)    Recommendations for Other Services       Precautions / Restrictions Precautions Precautions: Fall Precaution Comments: watch 02 (on high flow), HR    Mobility  Bed Mobility               General bed mobility comments: Pt up in chair on arrival and end of session    Transfers Overall transfer level: Needs assistance Equipment used: Rolling walker (2 wheeled) Transfers: Sit to/from Stand Sit to Stand: Min guard         General transfer comment: cues for hand placement x 3 trials from chair  Ambulation/Gait Ambulation/Gait assistance: Min guard Gait Distance (Feet): 20 Feet Assistive device: Rolling walker (2 wheeled) Gait Pattern/deviations: Step-through  pattern;Decreased step length - right;Decreased step length - left;Antalgic;Trunk flexed   Gait velocity interpretation: <1.8 ft/sec, indicate of risk for recurrent falls General Gait Details: pt walked 14', 20', 20' respectively walking forward and back within HHFNC tubing with use of RW and cues for posture and breathing technique. SpO2 dropping to 84% with activity and recovers within 30 sec seated to 91%   Stairs             Wheelchair Mobility    Modified Rankin (Stroke Patients Only)       Balance Overall balance assessment: Needs assistance   Sitting balance-Leahy Scale: Fair Sitting balance - Comments: sitting on edge of chair unsupported with no UE support and no LOB   Standing balance support: Bilateral upper extremity supported;During functional activity Standing balance-Leahy Scale: Poor Standing balance comment: walker and min guard for static standing                            Cognition Arousal/Alertness: Awake/alert Behavior During Therapy: WFL for tasks assessed/performed Overall Cognitive Status: Within Functional Limits for tasks assessed                                        Exercises General Exercises - Lower Extremity Long Arc Quad: Both;Seated;Strengthening;15 reps;AROM Hip Flexion/Marching: Both;10 reps;Seated;Strengthening;AROM    General  Comments        Pertinent Vitals/Pain Pain Score: 5  Pain Location: rt knee Pain Descriptors / Indicators: Aching;Sore Pain Intervention(s): Limited activity within patient's tolerance;Monitored during session;Repositioned    Home Living                      Prior Function            PT Goals (current goals can now be found in the care plan section) Progress towards PT goals: Progressing toward goals    Frequency    Min 3X/week      PT Plan Current plan remains appropriate    Co-evaluation              AM-PAC PT "6 Clicks" Mobility    Outcome Measure  Help needed turning from your back to your side while in a flat bed without using bedrails?: A Little Help needed moving from lying on your back to sitting on the side of a flat bed without using bedrails?: A Little Help needed moving to and from a bed to a chair (including a wheelchair)?: A Little Help needed standing up from a chair using your arms (e.g., wheelchair or bedside chair)?: A Little Help needed to walk in hospital room?: A Little Help needed climbing 3-5 steps with a railing? : A Lot 6 Click Score: 17    End of Session Equipment Utilized During Treatment: Oxygen Activity Tolerance: Patient tolerated treatment well Patient left: in chair;with call bell/phone within reach;with family/visitor present Nurse Communication: Mobility status PT Visit Diagnosis: Muscle weakness (generalized) (M62.81);Difficulty in walking, not elsewhere classified (R26.2);Other (comment)     Time: 0340-3524 PT Time Calculation (min) (ACUTE ONLY): 38 min  Charges:  $Gait Training: 8-22 mins $Therapeutic Exercise: 8-22 mins $Therapeutic Activity: 8-22 mins                     Passion Lavin P, PT Acute Rehabilitation Services Pager: [phone] Office: [place] 09/18/2020, 1:26 PM

## 2020-09-18 NOTE — Progress Notes (Signed)
Inpatient Rehab Admissions Coordinator:   I spoke with Pt. And daughter regarding potential CIR admit. Explained that Pt. Will need to be titrated to 6L of SpO2 to meet criteria for CIR. Pt. Is currently on 30L, so we will follow at a distance and pursue for CIR admit if Pt. Appears to be an appropriate candidate.    Clemens Catholic, [place], [place] Admissions Coordinator  [phone] ([place]) [phone] (office)

## 2020-09-18 NOTE — Progress Notes (Addendum)
Nutrition Follow-up  DOCUMENTATION CODES:   Not applicable  INTERVENTION:   -D/c Ensure Enlive po BID, each supplement provides 350 kcal and 20 grams of protein -Magic cup BID with meals, each supplement provides 290 kcal and 9 grams of protein -Glucerna Shake po TID, each supplement provides 220 kcal and 10 grams of protein -MVI with minerals daily  NUTRITION DIAGNOSIS:   Inadequate oral intake related to inability to eat as evidenced by NPO status.  Progressing; advanced to regular consistency diet on 09/17/20  GOAL:   Patient will meet greater than or equal to 90% of their needs  Progressing   MONITOR:   Vent status,Labs,Weight trends,Skin,I & O's  REASON FOR ASSESSMENT:   Ventilator    ASSESSMENT:   70 year old with history of hypertension, dyslipidemia, and type 2 diabetes presenting with acute hypoxemic respiratory failure likely due to new heart failure and atrial fibrillation with rapid ventricular rates.  4/22 EGD: no active bleeding, large duodenal ulcer, erosive gastritis; Intubated 4/24 TF Protocol initiated 4/26 Extubated 5/2- s/p BSE- advanced to regular texture diet with thin liquids  Reviewed I/O's: -2.8 L x 24 hours and -7.8 L since 09/04/20  UOP: 4 L x 24 hours  Case discussed with RN prior to visit, who reports pt is tolerating current diet texture well and consumes about 50% of his breakfast.   Pt sitting in recliner chair at time of visit, pleasant and in good spirits today. Spoke with pt and wife, who reports that appetite has improved significantly since diet was advanced to regular textures. Pt reports he is tolerating current diet texture well and wife notes that pt has been trying to take his time with eating meals and chewing more. He also prefers the food choices on regular consistency diet, as the chopped food was unappealing to him.   Pt shares that he consumed cereal, fresh fruit, and cranberry juice and consumed almost all of his  breakfast. Noted meal completion 20-80%. Pt is confident that he will eat more now that his diet is upgraded. He is also consuming his Ensure supplements when offered.   Discussed importance of good meal and supplement intake to promote healing  Medications reviewed and include miralax and demadex.   Labs reviewed: CBGS: 73-244 (inpatient orders for glycemic control are 1000 mg metformin BID, 10 mg empagliflozin daily, and 10 units insulin glargine daily).   NUTRITION - FOCUSED PHYSICAL EXAM:  Flowsheet Row Most Recent Value  Orbital Region No depletion  Upper Arm Region No depletion  Thoracic and Lumbar Region No depletion  Buccal Region No depletion  Temple Region No depletion  Clavicle Bone Region No depletion  Clavicle and Acromion Bone Region No depletion  Scapular Bone Region No depletion  Dorsal Hand No depletion  Patellar Region No depletion  Anterior Thigh Region No depletion  Posterior Calf Region No depletion  Edema (RD Assessment) Mild  Hair Reviewed  Eyes Reviewed  Mouth Reviewed  Skin Reviewed  Nails Reviewed       Diet Order:   Diet Order            Diet heart healthy/carb modified Room service appropriate? Yes; Fluid consistency: Thin  Diet effective now                 EDUCATION NEEDS:   No education needs have been identified at this time  Skin:  Skin Assessment: Reviewed RN Assessment  Last BM:  09/17/20  Height:   Ht Readings from Last  1 Encounters:  09/03/20 5' 7"  (1.702 m)    Weight:   Wt Readings from Last 1 Encounters:  09/18/20 87.4 kg    Ideal Body Weight:  67.3 kg  BMI:  Body mass index is 30.18 kg/m.  Estimated Nutritional Needs:   Kcal:  2000-2200 kcals  Protein:  100-115 g  Fluid:  > 2 L    Loistine Chance, RD, LDN, [place] Registered Dietitian II Certified Diabetes Care and Education Specialist Please refer to [facility] for RD and/or RD on-call/weekend/after hours pager

## 2020-09-18 NOTE — Progress Notes (Addendum)
Family Medicine Teaching Service Daily Progress Note Intern Pager: [phone]  Patient name: Gary [name] Medical record number: [id number] Date of birth: 05/06/1951 Age: 70 y.o. Gender: male  Primary Care Provider: Jalene Mullet, PA-C Consultants: Cardiology, Heart Failure, Pulmonology Code Status: FULL  Pt Overview and Major Events to Date:  4/18 admitted 4/19 right/left heart cath with concern for pulmonary HTN 4/22 2u pRBC, intubated, EGD (nonbleeding duodenal ulcers), transferred to ICU 4/26 extubated, 1u pRBC 4/28 FPTS resumed care  Assessment and Plan: Gary [name] a 70 y.o.malewho presented with acute hypoxemic respiratory failure found to have pulmonary HTN. PMHx significant for: HTN, T2DM, HLD, depression.  Acute Hypoxemic Respiratory Failure 2/2 PAH with cor pulmonale Still requiring high amounts of oxygen, 30L HFNC at 90% FiO2. Continues on Torsemide with almost 4L UOP yesterday. Total I/O since admission -7.8L. Down 0.4 kg since yesterday.  -Cardiology following; appreciate recommendations             -Continue Torsemide 60 mg daily              -Try to avoid long-term Amio if possible             -DC-CV not possible without anticoagulation  -Pulmonology following; appreciation recommendations             -Consider bubble study to evaluate for shunt physiology  -Continue Sildenafil 40 mg TID -Continue LABA/LAMA/ICS -Wean O2 as tolerated to maintain sats 88-92% -Outpatient PFTs and sleep study -Pulmonary hygiene, mobilization -May benefit from right thoracentesis if diuresischallenging  Paroxysmal Atrial Fibrillation/Atrial Flutter Rate controlled. On PO Amiodarone.  -Cardiology following, appreciate recommendations.  -Holding AC due to recent GIB requiring transfusions -Continue Amiodarone 400 mg daily   H. Pylori  Duodenal Ulcers Hgb slowly dropping over the last couple days 9.3>8.6>8.2.  Transfusion threshold 8 given CAD. Started on triple therapy yesterday.  -Triple therapy: Amoxicillin 1g BID, Clarithromycin 500 mg BID, Protonix 40 mg BID x14 days (5/2-5/15) -Will need TOC after treatment  CAD -Atorvastatin 40 mg daily -Holding ASA in setting of recent GIB and duodenal ulcers  Type 2 DM: Chronic, stable CBG's 73-244 in last 24 hours. Requiring 11 units of SSI. Home medications include Metformin, pioglitazone and Glipizide. Started on SGLT-2 yesterday, tolerating well.  -Continue Lantus 10U daily -Monitor CBG's -Continue metformin 1000 mg BID -Continue 10 mg Empagliflozin -Hold home Glipizide and pioglitazone  Hypertension with recent Hypotension BP's ranging 88-104/50-64.  -Continue Midodrine 10 mg TID  -Monitor BP's  FEN/GI: Dysphagia 3 PPx: SCDs  Status is: Inpatient  Remains inpatient appropriate because:Ongoing diagnostic testing needed not appropriate for outpatient work up and Inpatient level of care appropriate due to severity of illness   Dispo: The patient is from: Home              Anticipated d/c is to: LTAC              Patient currently is not medically stable to d/c.   Difficult to place patient No   Subjective:  Patient states that he wore CPAP last night and only tolerated it for about 3-hours. States that it "dried me out". He states that he was changed to a NRB and did better with it. He otherwise states that he is doing well. He had a bowel movement but did not look at it to see the color. He did state that it did not smell pleasant.   Objective: Temp:  [98.8 F (37.1 C)-99.8 F (37.7 C)] 99.4 F (  37.4 C) (05/03 0343) Pulse Rate:  [70-73] 70 (05/03 0343) Resp:  [20-27] 20 (05/03 0343) BP: (88-104)/(50-64) 88/51 (05/03 0343) SpO2:  [90 %-94 %] 91 % (05/03 0343) FiO2 (%):  [90 %-100 %] 90 % (05/03 0343) Weight:  [87.4 kg] 87.4 kg (05/03 0500) Physical Exam: General: Awake, alert, pleasant, conversational  Cardiovascular:  Irregularly irregular, rate controlled  Respiratory: CTAB with moderate air movement throughout, no wheezing/rhonchi/rales appreciated, on 30L HFNC, no accessory muscle use Abdomen: Soft, large chronic umbilical hernia, abdomen without tenderness to palpation of all quadrants Extremities: 2+ pitting edema to b/l ankles   Laboratory: Recent Labs  Lab 09/15/20 0444 09/16/20 0325 09/17/20 0510  WBC 9.6 10.9* 10.5  HGB 9.3* 9.3* 8.6*  HCT 28.8* 29.2* 27.0*  PLT 305 334 358   Recent Labs  Lab 09/15/20 0444 09/16/20 0325 09/17/20 0510  NA 134* 132* 136  K 3.6 3.4* 3.4*  CL 92* 88* 91*  CO2 32 34* 34*  BUN 22 23 19   CREATININE 0.97 0.99 0.98  CALCIUM 7.5* 7.5* 7.6*  GLUCOSE 85 111* 103*   Imaging/Diagnostic Tests: DG CHEST PORT 1 VIEW  Result Date: 09/17/2020 CLINICAL DATA:  Respiratory failure. EXAM: PORTABLE CHEST 1 VIEW COMPARISON:  09/14/2020 FINDINGS: Left IJ central venous catheter is stable in position. The cardiomediastinal contours are unchanged. Persistent bilateral pleural effusions. Progressive interstitial and airspace densities noted bilaterally. IMPRESSION: 1. No change in pleural effusions. 2. Worsening aeration of both lungs with progressive interstitial and airspace densities Electronically Signed   By: Kerby Moors M.D.   On: 09/17/2020 12:02     Sharion Settler, DO 09/18/2020, 5:34 AM PGY-1, [place] Intern pager: [phone], text pages welcome

## 2020-09-18 NOTE — Progress Notes (Signed)
Patient ID: Gary [name], male   DOB: 1951-03-11, 70 y.o.   MRN: [id number]     Advanced Heart Failure Rounding Note  PCP-Cardiologist: Elouise Munroe, MD   Subjective:   Events  4/22  EGD 4/22 showed 2 ulcers in duodenum that were not actively bleeding.  On PO Protonix, Hgb 10>9.3>8.6  4/23 Intubated. Dobutamine stopped due to marked tachycardia. Started antibiotics for pneumonia. Blood CX- NGTD. Sputum with strep.  4/24 Did not tolerate fast veletri wean.  4/25 Veletri stopped. Extubated. Given 1u PRBC  5/1 Amio gtt stopped>>PO 400 mg daily   Remains on high flow O2 - 30L at 90% Good urine output on increased dose of torsemide.   I took his oxygen off today and had him say the alphabet and sats decreased from 93% -> 79%. I then replaced O2 and had him repeat the alphabet and sats   CXR from yesterday with worsening effusions and RLL airspace disease.   Remains in rate-controlled  AFL on po amio. Co-ox 61%     Objective:   Weight Range: 87.4 kg Body mass index is 30.18 kg/m.   Vital Signs:   Temp:  [98.8 F (37.1 C)-99.8 F (37.7 C)] 99.4 F (37.4 C) (05/03 0343) Pulse Rate:  [70-94] 94 (05/03 0724) Resp:  [17-27] 17 (05/03 0724) BP: (88-106)/(50-64) 106/62 (05/03 0724) SpO2:  [91 %-94 %] 92 % (05/03 0926) FiO2 (%):  [90 %-100 %] 100 % (05/03 0926) Weight:  [87.4 kg] 87.4 kg (05/03 0500) Last BM Date: 09/16/20  Weight change: Filed Weights   09/16/20 0231 09/17/20 0500 09/18/20 0500  Weight: 88 kg 87.8 kg 87.4 kg    Intake/Output:   Intake/Output Summary (Last 24 hours) at 09/18/2020 1012 Last data filed at 09/18/2020 0900 Gross per 24 hour  Intake 1214 ml  Output 3775 ml  Net -2561 ml      Physical Exam   General:  Lying in bed on HFNC HEENT: normal Neck: supple. JVP hard to see  Carotids 2+ bilat; no bruits. No lymphadenopathy or thryomegaly appreciated. Cor: PMI nondisplaced. Regular rate & rhythm. No rubs, gallops or murmurs. Lungs: crackles and  decreased BS at bases  Abdomen: obese soft, nontender, nondistended. No hepatosplenomegaly. No bruits or masses. Good bowel sounds. Extremities: no cyanosis, clubbing, rash, edema Neuro: alert & orientedx3, cranial nerves grossly intact. moves all 4 extremities w/o difficulty. Affect pleasant    Telemetry    AFL 80-90s Personally reviewed  Labs    CBC Recent Labs    09/17/20 0510 09/18/20 0500  WBC 10.5 9.4  NEUTROABS 8.7*  --   HGB 8.6* 8.2*  HCT 27.0* 26.3*  MCV 92.5 93.6  PLT 358 888   Basic Metabolic Panel Recent Labs    09/17/20 0510 09/18/20 0500  NA 136 135  K 3.4* 3.8  CL 91* 92*  CO2 34* 35*  GLUCOSE 103* 70  BUN 19 18  CREATININE 0.98 0.97  CALCIUM 7.6* 7.4*   Liver Function Tests No results for input(s): AST, ALT, ALKPHOS, BILITOT, PROT, ALBUMIN in the last 72 hours. No results for input(s): LIPASE, AMYLASE in the last 72 hours. Cardiac Enzymes No results for input(s): CKTOTAL, CKMB, CKMBINDEX, TROPONINI in the last 72 hours.  BNP: BNP (last 3 results) Recent Labs    09/03/20 1433  BNP 1,202.7*    ProBNP (last 3 results) No results for input(s): PROBNP in the last 8760 hours.   D-Dimer No results for input(s): DDIMER in the last  72 hours. Hemoglobin A1C No results for input(s): HGBA1C in the last 72 hours. Fasting Lipid Panel No results for input(s): CHOL, HDL, LDLCALC, TRIG, CHOLHDL, LDLDIRECT in the last 72 hours. Thyroid Function Tests No results for input(s): TSH, T4TOTAL, T3FREE, THYROIDAB in the last 72 hours.  Invalid input(s): FREET3  Other results:   Imaging    DG CHEST PORT 1 VIEW  Result Date: 09/17/2020 CLINICAL DATA:  Respiratory failure. EXAM: PORTABLE CHEST 1 VIEW COMPARISON:  09/14/2020 FINDINGS: Left IJ central venous catheter is stable in position. The cardiomediastinal contours are unchanged. Persistent bilateral pleural effusions. Progressive interstitial and airspace densities noted bilaterally. IMPRESSION: 1.  No change in pleural effusions. 2. Worsening aeration of both lungs with progressive interstitial and airspace densities Electronically Signed   By: Kerby Moors M.D.   On: 09/17/2020 12:02     Medications:     Scheduled Medications: . sodium chloride   Intravenous Once  . amiodarone  400 mg Oral Daily  . amoxicillin  1,000 mg Oral Q12H  . arformoterol  15 mcg Nebulization BID  . atorvastatin  40 mg Oral Daily  . budesonide  0.5 mg Nebulization BID  . chlorhexidine  15 mL Mouth Rinse BID  . Chlorhexidine Gluconate Cloth  6 each Topical Daily  . clarithromycin  500 mg Oral Q12H  . empagliflozin  10 mg Oral Daily  . feeding supplement  237 mL Oral BID BM  . insulin glargine  10 Units Subcutaneous Daily  . mouth rinse  15 mL Mouth Rinse BID  . mouth rinse  15 mL Mouth Rinse q12n4p  . metFORMIN  1,000 mg Oral BID WC  . midodrine  10 mg Oral TID WC  . pantoprazole  40 mg Oral BID  . polyethylene glycol  17 g Oral Daily  . potassium chloride  40 mEq Oral BID  . revefenacin  175 mcg Nebulization Daily  . saline  1 application Each Nare BID  . sildenafil  40 mg Oral TID  . sodium chloride flush  10-40 mL Intracatheter Q12H  . torsemide  60 mg Oral Daily    Infusions: . sodium chloride Stopped (09/10/20 1336)  . sodium chloride 10 mL/hr at 09/13/20 0900    PRN Medications: sodium chloride, acetaminophen, guaiFENesin-dextromethorphan, ondansetron (ZOFRAN) IV, sodium chloride, sodium chloride flush    Assessment/Plan    1. Acute upper GI bleed with symptomatic anemia - hgb dropped to 6.7. Transfused.  - Eliquis off - CT negative for RP bleed - EGD with 2 ulcers not acutely bleeding.  - Hgb 9.3>>8.6 today  - Off Protonix gtt, PO 40 mg bid  - Would not restart AC for 2 weeks to allow GI healing - Biopsy back for H pylori. Treatment per GI. Would favor Rx if possible  2. PAH with cor pulmonale - CT chest 4/22: No PE or ILD - Echo LVEF 60% severe RV dilation and HK -  Cath with moderate PAH. PA = 66/31 (46) PCW = 13 Fick = 4.1/2.1 PVR = 7.8 WU - Suspect this is WHO Group 3 PAH due to hypoxic lung disease/OHS/OSA - Will order PFTs (when better compensated) and will need outpatient sleep study - Serologies for completed. Note abnormal ANA, with borderline elevation in chromatin antibody, but all other serologies negative.ESR only mildly abnormal at 44, likely related to anemia.  - On sildenafil 40 mg tid.     3. Paroxysmal AF - new onset. Unclear duration  - Eliquis on hold d/t GI  bleed - Was in NSR last week. Now back in AF x 48 hours  - No AC with recent large GI bleed - Continue SCDs.  - Continue PO amio 400 mg daily  - Remains in AF. Not candidate for DC-CV without AC. Hopefully can try to resume Eliquis next week. May be candidate for Watchman LAA closure if we can get him stable on [facility] for 1-2 months.Would avoid long-term amio use given underlying lung disease if at all possible.    4. CAD  -mostly non-obstructive - atorva 40 added  - No ASA with AC - No s/s angina  5. R lung mass - will need CT f/u   6. Shock - Suspect mixed hemorrhagic/cardiogenic (RV failure).   - Resolved  7. Acute hypoxemic respiratory failure with bilateral pleural effusions - Intubated pre-EGD on 4/22.   - Extubated--> on HFNC - Respiratory status remains tenuous. As noted before, degree of hypoxemia far out of proportion to HF or PAH. Continue with aggressive pulmonary toilet and mobilization. - With response to O2 supplementation shunt physiology less likely.  - Suspect he would benefit from possible thoracentesis. Also need to better understand process in RLL.   - Weight up about 6 pounds from lowest hospital weight. Will give one dose metolazone - Would he benefit from chest PT or repeat bronch?  - Will d/w CCM     Length of Stay: [place], MD  09/18/2020, 10:12 AM  Advanced Heart Failure Team Pager [phone] (M-F; 7a - 5p)  Please contact  [facility] for night-coverage after hours (5p -7a ) and weekends on [email or web address]

## 2020-09-18 NOTE — Progress Notes (Signed)
Pt refused bipap for tonight. RT will monitor.

## 2020-09-18 NOTE — Progress Notes (Signed)
NAME:  Severino Paolo, MRN:  [id number], DOB:  1951-02-22, LOS: 66 ADMISSION DATE:  09/03/2020, CONSULTATION DATE:  09/07/2020 REFERRING MD:  Nori Riis, CHIEF COMPLAINT:  GI Bleed   History of Present Illness:  70 y/o M who presented to [facility] on 4/18 with complaints of shortness of breath. 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 Has had progressive SOB for a few months.  Admitted to [facility] 4/18 for further workup and tx, with rapidly increasing O2 requirement. R/LHC performed 4/19 revealing PAH, 100% stenosis RCA and non-obstructive lesions. Had ongoing high O2 demand, on HHFNC. His course has been c/b GIB requiring PRBC. Moved to ICU and intubated for hypoxic respiratory failure 4/22. GI consulted and performed EGD 4/22 finding short segment Barrett's esophagus, small hiatal hernia, erosive gastritis. Managed in ICU for Shock -- hemorrhagic + cardiogenic shock with cor pulmonale. Patient able to extubate 4/26 and transferred out of the ICU 4/27 back to FPTS, remaining on [facility].   4/29 PCCM reconsulted for pulmonary recommendations regarding ongoing high O2 need  Pertinent  Medical History  Former smoker HTN DM Traumatic crush injury from a tree branch with multiple fractures Epistaxis requiring packing   Significant Hospital Events: Including procedures, antibiotic start and stop dates in addition to other pertinent events    4/18 admitted with SOB, echo with elevated right heart pressures, new finding of A. fib  4/19 R/L heart cath with concern for pulmonary hypertension  4/20 on 15L HFNC  4/21 hypotensive episode, responded to IVF, down to 5L O2  4/22 ABLA, to ICU, intubated, severe hypoxemia; EGD with nonbleeding duodenal ulcer  4/23 failed veletri wean  4/26 veletri weaned and pt extubated  4/27 transferred Out of ICU  4/29 PCCM re-consulted for recommendations regarding ongoing hypoxia and high oxygen demand. CCM ordering CXR, CTA chest       Interim History / Subjective:   Patient remains on HFNC FiO2 0.9 and Flow of 30L.   Patient placed on CPAP overnight. Tolerated it for 3 hours due to his mouth feeling dry.   He reports feeling well today, no complaints at this time.   Objective   Blood pressure (!) 101/55, pulse 85, temperature 98.2 F (36.8 C), temperature source Oral, resp. rate (!) 26, height 5' 7"  (1.702 m), weight 87.4 kg, SpO2 93 %. CVP:  [3 mmHg-4 mmHg] 4 mmHg  FiO2 (%):  [90 %-100 %] 100 %   Intake/Output Summary (Last 24 hours) at 09/18/2020 1312 Last data filed at 09/18/2020 0900 Gross per 24 hour  Intake 714 ml  Output 2875 ml  Net -2161 ml   Filed Weights   09/16/20 0231 09/17/20 0500 09/18/20 0500  Weight: 88 kg 87.8 kg 87.4 kg    Examination: Constitutional:  elderly man, no distress, sitting up in chair. HEENT: [place]/AT, sclera anicteric, moist mucous membranes Cardiovascular: RRR, no murmurs Respiratory: Normal work of breathing, diminished breath sounds at both bases. Shallow breaths. No wheezes or rhonchi. Abdomen: soft, non-tender, non-distended, bowel sounds + Skin: No rash Neurologic: Awake, alert, appropriate, moves all extremities and follows commands  Bedside US shows moderate to large right pleural effusion that is simple in appearance.   Labs/imaging that I have personally reviewed  (right click and "Reselect all SmartList Selections" daily)  4/29 CXR > B effusions, patchy bibasilar infiltrates, CM  4/29 CTA chest> no PE, CM with significant RV dilation, chronic PAH, mediastinal and hilar LAD, B effusions, R>L moderate on the R, progressive  perihilar infiltrates, RML nodular consolidation unclear significance (? Treated infxn).   +ANA, with elevated Chromatin Ab on 09/05/20  Resolved Hospital Problem list     Assessment & Plan:  Acute hypoxemic respiratory failure, multifactorial, in setting Bilateral infiltrates and effusions, severe PAH as below.  -treated for possible  PNA 4/23 thru 4/27 w CTX -no evidence PE, suspect that large contributor is decompensated R heart failure. Has increased perihilar infiltrates and effusions despite diuretics, some backtracking in O2 needs since he came off milrinone. Prostacyclin transitioned to sildenafil on 4/26.  P - Continue HFNC and NIV support nightly - He is on torsemide and sildenafil - Will consider a bubble study to evaluate for shunt physiology. Heart cath less concerning for this but he continues to have high degree of oxygen needs.  - Bedside US confirms moderate to large pleural effusion that is simple appearing. Will plan for bedside thoracentesis. Pleural fluid to be send for analysis. - On empiric bronchodilators, will need formal PFT as an outpatient - Push pulmonary hygiene, mobilization - ANA is positive with elevated chromatin ab. Will check anti-histone antibodies today due to possible drug induced inflammatory condition. Will also check ANCA.   Pulmonary HTN, cor pulmonale, likely secondary to OSA, COPD, Afib. Consider also a component primary R heart dysfxn given his RCA occlusion on cath (although a collateral branch is present) P - Needs outpatient PSG, PFT - Will need to follow-up with pulmonary, advanced CHF clinic - Anticoagulation deferred, continue amiodarone - Diuresis and sildenafil as above  Acute Upper GI Bleed Duodenal Ulcer, Erosive Gastritis, h pylori+ P - Stabilized, following CBC off anticoagulation - Treatment of H. Pylori per GI  RML nodular infiltrate P - Unclear significance, could reflect treated infection or more likely a progressive pulmonary edema pattern - Will need to arrange for repeat outpatient CT chest after acute issues have been optimized to follow for interval change.    Best practice (right click and "Reselect all SmartList Selections" daily)  Diet: Dys 3 Pain/Anxiety/Delirium protocol (if indicated): No VAP protocol (if indicated): Not indicated DVT  prophylaxis: PAS hose GI prophylaxis: PPI Glucose control:  lantus/SSI Central venous access:  No Arterial line: yes Foley:  yes Mobility:  OOB   PT consulted:Yes Last date of multidisciplinary goals of care discussion: pending Code Status:  full code Disposition: SDU / progressive   Freda Jackson, MD [place] Office: [phone]   See Amion for personal pager PCCM on call pager [phone] until 7pm. Please call [place] 7p-7a. [phone]

## 2020-09-18 NOTE — Procedures (Signed)
Thoracentesis  Procedure Note  Josephine Rudnick  [id number]  11/24/1950  Date:09/18/20  Time:3:03 PM   Provider Performing:Shamiah Kahler B Aquanetta Schwarz   Procedure: Thoracentesis with imaging guidance ([id number])  Indication(s) Pleural Effusion  Consent Risks of the procedure as well as the alternatives and risks of each were explained to the patient and/or caregiver.  Consent for the procedure was obtained and is signed in the bedside chart  Anesthesia Topical only with 1% lidocaine    Time Out Verified patient identification, verified procedure, site/side was marked, verified correct patient position, special equipment/implants available, medications/allergies/relevant history reviewed, required imaging and test results available.   Sterile Technique Maximal sterile technique including full sterile barrier drape, hand hygiene, sterile gown, sterile gloves, mask, hair covering, sterile ultrasound probe cover (if used).  Procedure Description Ultrasound was used to identify appropriate pleural anatomy for placement and overlying skin marked.  Area of drainage cleaned and draped in sterile fashion. Lidocaine was used to anesthetize the skin and subcutaneous tissue.  1200 cc's of cloudy serosanguinous appearing fluid was drained from the right pleural space. Catheter then removed and bandaid applied to site.   Complications/Tolerance None; patient tolerated the procedure well. Chest X-ray is ordered to confirm no post-procedural complication.   EBL Minimal   Specimen(s) Pleural fluid

## 2020-09-18 NOTE — Consult Note (Addendum)
   [facility] Inpatient Consult   09/18/2020  Gary [name] [birth year] [id number]   [place] [facility] [ACO] Patient:  Medicare CMS DCE  Addendum    Primary Care Provider: Kassie Mends, PA [facility] is listed to provide the transition of care follow up.   Patient screened for length of stay  hospitalization with progression of care needs.  Review of patient's medical record reveals patient continues with ongoing care needs and being recommended for an inpatient rehabilitation level of care for transition from acute hospital.  Reviewed LCSW notes and PT/OT recommendations for transitional care needs.   Plan:  Continue to follow progress and disposition to assess for post hospital care management needs.    For questions contact:   Natividad Brood, RN BSN [facility] Liaison  [phone] business mobile phone Toll free office [phone]  Fax number: [phone] [email or web address]

## 2020-09-19 ENCOUNTER — Telehealth (HOSPITAL_COMMUNITY): Payer: Self-pay | Admitting: Pharmacist

## 2020-09-19 ENCOUNTER — Other Ambulatory Visit (HOSPITAL_COMMUNITY): Payer: Self-pay

## 2020-09-19 DIAGNOSIS — J9601 Acute respiratory failure with hypoxia: Secondary | ICD-10-CM | POA: Diagnosis not present

## 2020-09-19 LAB — BASIC METABOLIC PANEL
Anion gap: 9 (ref 5–15)
BUN: 28 mg/dL — ABNORMAL HIGH (ref 8–23)
CO2: 35 mmol/L — ABNORMAL HIGH (ref 22–32)
Calcium: 7.5 mg/dL — ABNORMAL LOW (ref 8.9–10.3)
Chloride: 89 mmol/L — ABNORMAL LOW (ref 98–111)
Creatinine, Ser: 1.29 mg/dL — ABNORMAL HIGH (ref 0.61–1.24)
GFR, Estimated: 59 mL/min — ABNORMAL LOW (ref >60)
Glucose, Bld: 114 mg/dL — ABNORMAL HIGH (ref 70–99)
Potassium: 4.5 mmol/L (ref 3.5–5.1)
Sodium: 133 mmol/L — ABNORMAL LOW (ref 135–145)

## 2020-09-19 LAB — CBC
HCT: 26.7 % — ABNORMAL LOW (ref 39.0–52.0)
Hemoglobin: 8.5 g/dL — ABNORMAL LOW (ref 13.0–17.0)
MCH: 29.6 pg (ref 26.0–34.0)
MCHC: 31.8 g/dL (ref 30.0–36.0)
MCV: 93 fL (ref 80.0–100.0)
Platelets: 447 10*3/uL — ABNORMAL HIGH (ref 150–400)
RBC: 2.87 MIL/uL — ABNORMAL LOW (ref 4.22–5.81)
RDW: 14.8 % (ref 11.5–15.5)
WBC: 9.8 10*3/uL (ref 4.0–10.5)
nRBC: 0 % (ref 0.0–0.2)

## 2020-09-19 LAB — MPO/PR-3 (ANCA) ANTIBODIES
ANCA Proteinase 3: <3.5 U/mL (ref 0.0–3.5)
Myeloperoxidase Abs: <9 U/mL (ref 0.0–9.0)

## 2020-09-19 LAB — PATHOLOGIST SMEAR REVIEW

## 2020-09-19 LAB — GLUCOSE, CAPILLARY
Glucose-Capillary: 96 mg/dL (ref 70–99)
Glucose-Capillary: 146 mg/dL — ABNORMAL HIGH (ref 70–99)
Glucose-Capillary: 131 mg/dL — ABNORMAL HIGH (ref 70–99)
Glucose-Capillary: 119 mg/dL — ABNORMAL HIGH (ref 70–99)

## 2020-09-19 LAB — COOXEMETRY PANEL
Carboxyhemoglobin: 1.2 % (ref 0.5–1.5)
Methemoglobin: 0.6 % (ref 0.0–1.5)
O2 Saturation: 66.5 %
Total hemoglobin: 8.7 g/dL — ABNORMAL LOW (ref 12.0–16.0)

## 2020-09-19 LAB — HISTONE ANTIBODIES, IGG, BLOOD: DNA-Histone: 0.3 Units (ref 0.0–0.9)

## 2020-09-19 LAB — PH, BODY FLUID: pH, Body Fluid: 8

## 2020-09-19 LAB — CYTOLOGY - NON PAP

## 2020-09-19 LAB — TRIGLYCERIDES, BODY FLUIDS: Triglycerides, Fluid: 14 mg/dL

## 2020-09-19 NOTE — Progress Notes (Addendum)
Family Medicine Teaching Service Daily Progress Note Intern Pager: [phone]  Patient name: Gary [name] Medical record number: [id number] Date of birth: 06-19-1950 Age: 70 y.o. Gender: male  Primary Care Provider: Encarnacion Slates, PA-C Consultants: Heart Failure, Cardiology, Pulmonology  Code Status: FULL  Pt Overview and Major Events to Date:  4/18 admitted 4/19 right/left heart cath with concern for pulmonary HTN 4/22 2u pRBC, intubated, EGD (nonbleeding duodenal ulcers), transferred to ICU 4/26 extubated, 1u pRBC 4/28 FPTS resumed care 5/3: 1200 cc serosanguinous fluid removed via thoracentesis  Assessment and Plan: Gary [name] a 70 y.o.malewhopresentedwith acute hypoxemic respiratory failure found to have pulmonary HTN. PMHx significant for: HTN, T2DM, HLD, depression.  Acute Hypoxemic Respiratory Failure 2/2 PAH with cor pulmonale Thoracentesis performed 5/3 with 1200cc of serosanguinous fluid. DNA-histone antibodies, Mpo and ANCA antibodies WNL, pathology smear showed reactive mesothelial cells and macrophages present without atypia. Cytology without malignant cells present. Triglyceride level in fluid WNL. Had improved down to 15L HFNC at 60% FiO2 at best. During my encounter he was at 20L saturating anywhere between 79-94%. He has now increased back to 30L at 60%. HRCT hopefully today.  -Cardiology following; appreciate recommendations -Try to avoid long-term Amio if possible -DC-CV not possible without anticoagulation  -Pulmonology following; appreciation recommendations -Continue Sildenafil 40 mg TID -Continue LABA/LAMA/ICS -Wean O2 as tolerated to maintain sats 88-92% -Outpatient PFTs and sleep study -Pulmonary hygiene, mobilization             -F/u fluid pH, fungus culture, body fluid culture, cytology  -F/u HRCT -K+ 4.1; holding potassium supplementation   Paroxysmal Atrial  Fibrillation/Atrial Flutter Rate controlled.OnPO Amiodarone.  -Cardiology following, appreciate recommendations.  -Holding AC due to recent GIB requiring transfusions; May be able to resume next week -Continue Amiodarone 400 mg daily   [name]: Stable Hgbstable 8.5>8.4.Transfusion threshold 8 given CAD. Ontriple therapy. -Triple therapy: Amoxicillin 1g BID, Clarithromycin 500 mg BID, Protonix 40 mg BID x14 days(5/2-5/15) -Will need TOC outpatient after treatment  Thrombocytosis Platelets 447>502 this AM. Previously normal. Likely reactive. -Monitor with CBC  Severe Protein Calorie Malnutrition Calcium this AM 7.7 but corrected to 9.4 given low Albumin. -Continue nutritional supplementation   CAD: Chronic, stable -Atorvastatin 40 mg daily -Holding ASA in setting of recent GIB and duodenal ulcers  Type 2 DM: Chronic, stable CBG'swell controlled, 83-146in last 24 hours. Home medications include Metformin, pioglitazone and Glipizide.  -ContinueLantus10U daily -Continuemetformin 1000 mg BID -Continue10 mg Empagliflozin -HoldhomeGlipizideand pioglitazone -Monitor CBG's  Hypertension with recent Hypotension BP's ranging86-103/44-62. -Continue Midodrine 10 mg TID  -Monitor BP's  FEN/GI:Dysphagia 3 FIE:PPIR   Status is: Inpatient  Remains inpatient appropriate because:Ongoing diagnostic testing needed not appropriate for outpatient work up and Inpatient level of care appropriate due to severity of illness   Dispo: The patient is from: Home              Anticipated d/c is to: CIR              Patient currently is not medically stable to d/c.   Difficult to place patient No    Subjective:  Patient states that he was unable to sleep last night due to waking up every hour on the hour waiting for CT.  He feels that his breathing is improving.  He has no other complaints or concerns at this time.  Objective: Temp:  [97.6 F (36.4  C)-98.7 F (37.1 C)] 97.6 F (36.4 C) (05/04 1142) Pulse Rate:  [77-98] 93 (05/04  1200) Resp:  [17-30] 17 (05/04 1200) BP: (90-110)/(53-63) 95/59 (05/04 1200) SpO2:  [91 %-98 %] 93 % (05/04 1200) FiO2 (%):  [80 %-90 %] 80 % (05/04 1238) Weight:  [85 kg] 85 kg (05/04 0407) Physical Exam: General: Awake, alert, no distress, pleasant, intermittently sleeps during exam Cardiovascular: Regular rate and rhythm Respiratory: On high flow nasal cannula 20 L 60% FiO2, breathing comfortably, slight dry blood is appreciated below the left nare, SPO2 ranging 79-94 during encounter Extremities: Compression stockings in place, 2+ pitting edema around bilateral ankles  Laboratory: Recent Labs  Lab 09/17/20 0510 09/18/20 0500 09/19/20 0306  WBC 10.5 9.4 9.8  HGB 8.6* 8.2* 8.5*  HCT 27.0* 26.3* 26.7*  PLT 358 381 447*   Recent Labs  Lab 09/17/20 0510 09/18/20 0500 09/18/20 1310 09/19/20 0306  NA 136 135  --  133*  K 3.4* 3.8  --  4.5  CL 91* 92*  --  89*  CO2 34* 35*  --  35*  BUN 19 18  --  28*  CREATININE 0.98 0.97  --  1.29*  CALCIUM 7.6* 7.4*  --  7.5*  PROT  --   --  5.7*  --   GLUCOSE 103* 70  --  114*     Imaging/Diagnostic Tests: No results found.   Sabino Dick, DO 09/19/2020, 2:14 PM PGY-1, [facility] Family Medicine FPTS Intern pager: [phone], text pages welcome

## 2020-09-19 NOTE — Telephone Encounter (Signed)
Advanced Heart Failure Patient Advocate Encounter  Prior Authorization for sildenafil has been approved.    PA# [id number] Effective dates: 09/19/20 through 05/18/21  Audry Riles, PharmD, BCPS, BCCP, CPP Heart Failure Clinic Pharmacist [phone]

## 2020-09-19 NOTE — Plan of Care (Signed)
  Problem: Education: Goal: Knowledge of General Education information will improve Description: Including pain rating scale, medication(s)/side effects and non-pharmacologic comfort measures Outcome: Progressing   Problem: Health Behavior/Discharge Planning: Goal: Ability to manage health-related needs will improve Outcome: Progressing   Problem: Clinical Measurements: Goal: Ability to maintain clinical measurements within normal limits will improve Outcome: Progressing Goal: Will remain free from infection Outcome: Progressing Goal: Diagnostic test results will improve Outcome: Progressing Goal: Respiratory complications will improve Outcome: Progressing Goal: Cardiovascular complication will be avoided Outcome: Progressing   Problem: Activity: Goal: Risk for activity intolerance will decrease Outcome: Progressing   Problem: Nutrition: Goal: Adequate nutrition will be maintained Outcome: Progressing   Problem: Coping: Goal: Level of anxiety will decrease Outcome: Progressing   Problem: Elimination: Goal: Will not experience complications related to bowel motility Outcome: Progressing Goal: Will not experience complications related to urinary retention Outcome: Progressing   Problem: Pain Managment: Goal: General experience of comfort will improve Outcome: Progressing   Problem: Safety: Goal: Ability to remain free from injury will improve Outcome: Progressing   Problem: Skin Integrity: Goal: Risk for impaired skin integrity will decrease Outcome: Progressing   Problem: Education: Goal: Ability to demonstrate management of disease process will improve Outcome: Progressing Goal: Ability to verbalize understanding of medication therapies will improve Outcome: Progressing Goal: Individualized Educational Video(s) Outcome: Progressing   Problem: Activity: Goal: Capacity to carry out activities will improve Outcome: Progressing   Problem: Cardiac: Goal:  Ability to achieve and maintain adequate cardiopulmonary perfusion will improve Outcome: Progressing   Problem: Activity: Goal: Ability to tolerate increased activity will improve Outcome: Progressing   Problem: Respiratory: Goal: Ability to maintain a clear airway and adequate ventilation will improve Outcome: Progressing   Problem: Role Relationship: Goal: Method of communication will improve Outcome: Progressing

## 2020-09-19 NOTE — Progress Notes (Signed)
Occupational Therapy Treatment Patient Details Name: Gary [name] MRN: [id number] DOB: 08-Jan-1951 Today's Date: 09/19/2020    History of present illness Patient is a 70 y/o male who presents on 09/03/20 with progressive SOB. Found to have acute respiratory failure likely due to new onset of heart failure and A-fib with RVR. CXR- Rt effusion, prominent interstitial edema/disease. s/p cardiac cath 09/04/20. 4/21 hypotension, 4/22 drop in Hgb, GIB with intubation and urgent bedside EGD on 4/22 with PT sign off.  Pt was extubated on 4/26. PT reordered on 4/27.  PMhx: HTN and DM.   OT comments  Pt making steady progress towards OT goals this session. Pt received seated in recliner with wife present agreeable to OT session. Pt completed x2 sit<>stands from recliner with RW and min guard assist, pt limited in ambulation distance d/t decreased length of HHFNC cord but able take steps forward<>backward with min guard assist. Pt additionally completed therex prior to mobility as pt reports therex helps with his knee pain. Pt on 15 L HHFNC during session with SpO2 >81% during session, pt with good carryover of pursed lip breathing and good self awareness into activity tolerance. Pt would continue to benefit from skilled occupational therapy while admitted and after d/c to address the below listed limitations in order to improve overall functional mobility and facilitate independence with BADL participation. DC plan remains appropriate, will follow acutely per POC.     Follow Up Recommendations  CIR    Equipment Recommendations  3 in 1 bedside commode;Tub/shower bench    Recommendations for Other Services      Precautions / Restrictions Precautions Precautions: Fall Precaution Comments: watch 02 (on high flow), HR Restrictions Weight Bearing Restrictions: No       Mobility Bed Mobility               General bed mobility comments: Pt up in chair on arrival and end of session     Transfers Overall transfer level: Needs assistance Equipment used: Rolling walker (2 wheeled) Transfers: Sit to/from Stand Sit to Stand: Min guard         General transfer comment: min guard for safety to rise from chair x2    Balance Overall balance assessment: Needs assistance Sitting-balance support: No upper extremity supported;Feet supported Sitting balance-Leahy Scale: Fair Sitting balance - Comments: sitting on edge of chair unsupported with no UE support and no LOB   Standing balance support: Bilateral upper extremity supported;During functional activity Standing balance-Leahy Scale: Poor Standing balance comment: walker and min guard for static standing                           ADL either performed or assessed with clinical judgement   ADL Overall ADL's : Needs assistance/impaired                         Toilet Transfer: Min Marine scientist Details (indicate cue type and reason): simulated via functional mobility         Functional mobility during ADLs: Min guard;Rolling walker (limited mobility distance d/t HHFNC) General ADL Comments: pt continues to present with decreased activity tolerance, dyspnea with exertion and impaired strength, pt on 15 L HHFNC     Vision       Perception     Praxis      Cognition Arousal/Alertness: Awake/alert Behavior During Therapy: WFL for tasks assessed/performed Overall Cognitive Status: Within Functional Limits for tasks  assessed                                          Exercises General Exercises - Lower Extremity Ankle Circles/Pumps: Both;10 reps;Seated;Strengthening Long Arc Quad: Both;Seated;Strengthening;15 reps;AROM Hip Flexion/Marching: Both;10 reps;Seated;Strengthening;AROM   Shoulder Instructions       General Comments pt on 15L HHFNC with SpO2 >81% during session, good carryover of pursed lip breathing technique. pts wife present during  session    Pertinent Vitals/ Pain       Pain Assessment: Faces Faces Pain Scale: Hurts little more Pain Location: rt knee Pain Descriptors / Indicators: Aching;Sore Pain Intervention(s): Limited activity within patient's tolerance;Monitored during session;Repositioned  Home Living                                          Prior Functioning/Environment              Frequency  Min 2X/week        Progress Toward Goals  OT Goals(current goals can now be found in the care plan section)  Progress towards OT goals: Progressing toward goals  Acute Rehab OT Goals Patient Stated Goal: none stated this session Time For Goal Achievement: 09/27/20 Potential to Achieve Goals: Good  Plan Discharge plan remains appropriate;Frequency remains appropriate    Co-evaluation                 AM-PAC OT "6 Clicks" Daily Activity     Outcome Measure   Help from another person eating meals?: None Help from another person taking care of personal grooming?: A Little Help from another person toileting, which includes using toliet, bedpan, or urinal?: A Lot Help from another person bathing (including washing, rinsing, drying)?: A Lot Help from another person to put on and taking off regular upper body clothing?: A Little Help from another person to put on and taking off regular lower body clothing?: A Lot 6 Click Score: 16    End of Session Equipment Utilized During Treatment: Gait belt;Rolling walker;Oxygen;Other (comment) (15 L HHFNC)  OT Visit Diagnosis: Unsteadiness on feet (R26.81);Muscle weakness (generalized) (M62.81);Pain Pain - Right/Left: Right Pain - part of body: Knee   Activity Tolerance Patient tolerated treatment well   Patient Left in chair;with call bell/phone within reach;with family/visitor present   Nurse Communication Mobility status        Time: 0354-6568 OT Time Calculation (min): 19 min  Charges: OT General Charges $OT Visit: 1  Visit OT Treatments $Therapeutic Activity: 8-22 mins  Harley Alto., COTA/L Acute Rehabilitation Services [phone] [place] 09/19/2020, 1:37 PM

## 2020-09-19 NOTE — Progress Notes (Addendum)
Family Medicine Teaching Service Daily Progress Note Intern Pager: [phone]  Patient name: Gary [name] Medical record number: [id number] Date of birth: 1950-11-15 Age: 70 y.o. Gender: male  Primary Care Provider: Jalene Mullet, PA-C Consultants: Heart Failure, Cardiology, Pulmonology Code Status: FULL  Pt Overview and Major Events to Date:  4/18 admitted 4/19 right/left heart cath with concern for pulmonary HTN 4/22 2u pRBC, intubated, EGD (nonbleeding duodenal ulcers), transferred to ICU 4/26 extubated, 1u pRBC 4/28 FPTS resumed care  Assessment and Plan: Gary [name] a 69 y.o.malewhopresentedwith acute hypoxemic respiratory failure found to have pulmonary HTN. PMHx significant for: HTN, T2DM, HLD, depression.  Acute Hypoxemic Respiratory Failure 2/2 PAH with cor pulmonale S/p removal of 1200cc of serosanguinous fluid by thoracentesis yesterday. Appears to be exudative effusion by Lights criteria given serum LDH >2/3 upper limit of normal. Final micro report still pending but no organisms seen on priliminary read and rare WBC's. Previously had +ANA and Chromatin Ab, could likely be related to undiagnosed lupus though further labs are pending. Patient feels much improved this AM, states he was able to sleep throughout the night for the first time in a while. I was able to decrease his oxygen down to 15L from 25L HFNC while in room and patient maintained sats >95%. Discussed with RN to continue to wean oxygen as tolerated today to maintain sats 88-92%. Unable to tolerate CPAP and refused last night. Weight down 2.4kg. -Cardiology following; appreciate recommendations -S/p 1 dose of metolazone yesterday. Hold Torsemidetoday. -Try to avoid long-term Amio if possible -DC-CV not possible without anticoagulation  -Pulmonology following; appreciation recommendations -Continue Sildenafil 40 mg TID -Continue  LABA/LAMA/ICS -Wean O2 as tolerated to maintain sats 88-92% -Outpatient PFTs and sleep study -Pulmonary hygiene, mobilization  -F/u fluid pH, fungus culture, body fluid culture, cytology, fluid cholesterol -Holding potassium supplementation given K 4.5 -F/u histone antibodies, ANCA Titers, MPO/PR-2 antibodies   Paroxysmal Atrial Fibrillation/Atrial Flutter Rate controlled. On PO Amiodarone.  -Cardiology following, appreciate recommendations.  -Holding AC due to recent GIB requiring transfusions; May be able to resume next week -Continue Amiodarone 400 mg daily   [name]: Stable Hgb stable 8.2>8.5. Transfusion threshold 8 given CAD. On triple therapy. -Triple therapy: Amoxicillin 1g BID, Clarithromycin 500 mg BID, Protonix 40 mg BID x14 days (5/2-5/15) -Will need TOC outpatient after treatment  CAD: Chronic, stable -Atorvastatin 40 mg daily -Holding ASA in setting of recent GIB and duodenal ulcers  Type 2 DM: Chronic, stable CBG's 138-201 in last 24 hours.Home medications include Metformin, pioglitazone and Glipizide.   -ContinueLantus10U daily -Continue metformin 1000 mg BID -Continue 10 mg Empagliflozin -Hold home Glipizide and pioglitazone -Monitor CBG's  Hypertension with recent Hypotension BP's ranging 90-110/53-63.  -Continue Midodrine 10 mg TID  -Monitor BP's  FEN/GI:Dysphagia 3 NKN:LZJQ   Status is: Inpatient  Remains inpatient appropriate because:Inpatient level of care appropriate due to severity of illness   Dispo: The patient is from: Home              Anticipated d/c is to: Rehab vs. home              Patient currently is not medically stable to d/c.   Difficult to place patient No   Subjective:  Patient is very enthusiastic this morning and states that he feels remarkably better since the thoracentesis.  Last night was the first night he was able to sleep throughout the night without  waking up.  He has no complaints.  Objective: Temp:  [  97.7 F (36.5 C)-98.7 F (37.1 C)] 98.6 F (37 C) (05/04 0407) Pulse Rate:  [77-98] 84 (05/04 0407) Resp:  [17-30] 20 (05/04 0407) BP: (90-110)/(53-63) 90/55 (05/04 0407) SpO2:  [92 %-98 %] 94 % (05/04 0407) FiO2 (%):  [90 %-100 %] 90 % (05/04 0233) Weight:  [85 kg] 85 kg (05/04 0407) Physical Exam: General: Awake, alert, pleasant, in no distress Cardiovascular: Irregularly irregular, no murmur Respiratory: Breathing comfortably on high flow nasal cannula at 15 L, CTAB without wheezing/rhonchi/rales Abdomen: Soft, nontender, large chronic umbilical hernia present Extremities: 2+ pitting edema to bilateral ankles, 2+ DP pulses bilaterally  Laboratory: Recent Labs  Lab 09/17/20 0510 09/18/20 0500 09/19/20 0306  WBC 10.5 9.4 9.8  HGB 8.6* 8.2* 8.5*  HCT 27.0* 26.3* 26.7*  PLT 358 381 447*   Recent Labs  Lab 09/17/20 0510 09/18/20 0500 09/18/20 1310 09/19/20 0306  NA 136 135  --  133*  K 3.4* 3.8  --  4.5  CL 91* 92*  --  89*  CO2 34* 35*  --  35*  BUN 19 18  --  28*  CREATININE 0.98 0.97  --  1.29*  CALCIUM 7.6* 7.4*  --  7.5*  PROT  --   --  5.7*  --   GLUCOSE 103* 70  --  114*   Imaging/Diagnostic Tests: DG CHEST PORT 1 VIEW  Result Date: 09/18/2020 CLINICAL DATA:  Thoracentesis, history of bilateral pleural effusions EXAM: PORTABLE CHEST 1 VIEW COMPARISON:  09/17/2020 FINDINGS: Single frontal view of the chest demonstrates left internal jugular catheter tip overlying superior vena cava. Near complete resolution of right pleural effusion after thoracentesis. No evidence of pneumothorax. Continued left basilar consolidation and left effusion, stable. Patchy consolidation throughout the remainder of the lungs, greatest in the upper lung zones, stable. The cardiac silhouette is stable. No acute bony abnormalities. IMPRESSION: 1. No complication after right thoracentesis, with near complete evacuation of the right  pleural effusion. 2. Stable left basilar consolidation and effusion. 3. Patchy bilateral airspace disease, which may reflect edema or infection. Electronically Signed   By: Randa Ngo M.D.   On: 09/18/2020 15:50    Sharion Settler, DO 09/19/2020, 5:43 AM PGY-1, [place] Intern pager: [phone], text pages welcome

## 2020-09-19 NOTE — Progress Notes (Addendum)
Patient ID: Gary [name], male   DOB: 1950/11/22, 70 y.o.   MRN: [id number]     Advanced Heart Failure Rounding Note  PCP-Cardiologist: Elouise Munroe, MD   Subjective:   Events  4/22  EGD 4/22 showed 2 ulcers in duodenum that were not actively bleeding.  On PO Protonix, Hgb 10>9.3>8.6  4/23 Intubated. Dobutamine stopped due to marked tachycardia. Started antibiotics for pneumonia. Blood CX- NGTD. Sputum with strep.  4/24 Did not tolerate fast veletri wean.  4/25 Veletri stopped. Extubated. Given 1u PRBC  5/1 Amio gtt stopped>>PO 400 mg daily  5/3 Right thoracentesis with 1200cc out (mostly transudative)  Underwent R thoracentesis yesterday. Remains on high flow O2 25L (was on 30) at 90% Continues to diurese well on oral torsemide and got one dose of metolazone. Creatinine has bumped to 1.3  Breathing is much better. Able to sleep last night. No orthopnea or PND.   Post thora (and diuresis) CXR still with significant airspace disease.   Remains in rate-controlled  AFL on po amio. Co-ox 67%     Objective:   Weight Range: 85 kg Body mass index is 29.35 kg/m.   Vital Signs:   Temp:  [97.7 F (36.5 C)-98.7 F (37.1 C)] 98.5 F (36.9 C) (05/04 0728) Pulse Rate:  [77-98] 84 (05/04 0407) Resp:  [18-30] 20 (05/04 0407) BP: (90-110)/(53-63) 90/55 (05/04 0407) SpO2:  [92 %-98 %] 94 % (05/04 0407) FiO2 (%):  [90 %-100 %] 90 % (05/04 0233) Weight:  [85 kg] 85 kg (05/04 0407) Last BM Date: 09/16/20  Weight change: Filed Weights   09/17/20 0500 09/18/20 0500 09/19/20 0407  Weight: 87.8 kg 87.4 kg 85 kg    Intake/Output:   Intake/Output Summary (Last 24 hours) at 09/19/2020 0814 Last data filed at 09/19/2020 0408 Gross per 24 hour  Intake 957 ml  Output 4775 ml  Net -3818 ml      Physical Exam   General:  Sitting up in bed on HF [place] No resp difficulty HEENT: normal Neck: supple. no JVD. Carotids 2+ bilat; no bruits. No lymphadenopathy or thryomegaly appreciated. Cor: PMI  nondisplaced. Irregular rate & rhythm. No rubs, gallops or murmurs. Lungs: dull at bases + crackles  Abdomen: obese soft, nontender, nondistended. No hepatosplenomegaly. No bruits or masses. Good bowel sounds. Extremities: no cyanosis, clubbing, rash, edema Neuro: alert & orientedx3, cranial nerves grossly intact. moves all 4 extremities w/o difficulty. Affect pleasant   Telemetry    AFL 80-90s Personally reviewed  Labs    CBC Recent Labs    09/17/20 0510 09/18/20 0500 09/19/20 0306  WBC 10.5 9.4 9.8  NEUTROABS 8.7*  --   --   HGB 8.6* 8.2* 8.5*  HCT 27.0* 26.3* 26.7*  MCV 92.5 93.6 93.0  PLT 358 381 856*   Basic Metabolic Panel Recent Labs    09/18/20 0500 09/19/20 0306  NA 135 133*  K 3.8 4.5  CL 92* 89*  CO2 35* 35*  GLUCOSE 70 114*  BUN 18 28*  CREATININE 0.97 1.29*  CALCIUM 7.4* 7.5*   Liver Function Tests Recent Labs    09/18/20 1310  PROT 5.7*  ALBUMIN 1.9*   No results for input(s): LIPASE, AMYLASE in the last 72 hours. Cardiac Enzymes No results for input(s): CKTOTAL, CKMB, CKMBINDEX, TROPONINI in the last 72 hours.  BNP: BNP (last 3 results) Recent Labs    09/03/20 1433  BNP 1,202.7*    ProBNP (last 3 results) No results for input(s): PROBNP in  the last 8760 hours.   D-Dimer No results for input(s): DDIMER in the last 72 hours. Hemoglobin A1C No results for input(s): HGBA1C in the last 72 hours. Fasting Lipid Panel No results for input(s): CHOL, HDL, LDLCALC, TRIG, CHOLHDL, LDLDIRECT in the last 72 hours. Thyroid Function Tests No results for input(s): TSH, T4TOTAL, T3FREE, THYROIDAB in the last 72 hours.  Invalid input(s): FREET3  Other results:   Imaging    DG CHEST PORT 1 VIEW  Result Date: 09/18/2020 CLINICAL DATA:  Thoracentesis, history of bilateral pleural effusions EXAM: PORTABLE CHEST 1 VIEW COMPARISON:  09/17/2020 FINDINGS: Single frontal view of the chest demonstrates left internal jugular catheter tip overlying  superior vena cava. Near complete resolution of right pleural effusion after thoracentesis. No evidence of pneumothorax. Continued left basilar consolidation and left effusion, stable. Patchy consolidation throughout the remainder of the lungs, greatest in the upper lung zones, stable. The cardiac silhouette is stable. No acute bony abnormalities. IMPRESSION: 1. No complication after right thoracentesis, with near complete evacuation of the right pleural effusion. 2. Stable left basilar consolidation and effusion. 3. Patchy bilateral airspace disease, which may reflect edema or infection. Electronically Signed   By: Randa Ngo M.D.   On: 09/18/2020 15:50     Medications:     Scheduled Medications: . sodium chloride   Intravenous Once  . amiodarone  400 mg Oral Daily  . amoxicillin  1,000 mg Oral Q12H  . arformoterol  15 mcg Nebulization BID  . atorvastatin  40 mg Oral Daily  . budesonide  0.5 mg Nebulization BID  . chlorhexidine  15 mL Mouth Rinse BID  . Chlorhexidine Gluconate Cloth  6 each Topical Daily  . clarithromycin  500 mg Oral Q12H  . empagliflozin  10 mg Oral Daily  . feeding supplement (GLUCERNA SHAKE)  237 mL Oral TID BM  . ferrous sulfate  325 mg Oral Q breakfast  . insulin glargine  10 Units Subcutaneous Daily  . mouth rinse  15 mL Mouth Rinse BID  . mouth rinse  15 mL Mouth Rinse q12n4p  . metFORMIN  1,000 mg Oral BID WC  . midodrine  10 mg Oral TID WC  . multivitamin with minerals  1 tablet Oral Daily  . pantoprazole  40 mg Oral BID  . polyethylene glycol  17 g Oral Daily  . revefenacin  175 mcg Nebulization Daily  . saline  1 application Each Nare BID  . sildenafil  40 mg Oral TID  . sodium chloride flush  10-40 mL Intracatheter Q12H  . torsemide  60 mg Oral Daily    Infusions: . sodium chloride Stopped (09/10/20 1336)  . sodium chloride 10 mL/hr at 09/13/20 0900    PRN Medications: sodium chloride, acetaminophen, guaiFENesin-dextromethorphan, ondansetron  (ZOFRAN) IV, sodium chloride, sodium chloride flush    Assessment/Plan    1. Acute upper GI bleed with symptomatic anemia - hgb dropped to 6.7. Transfused.  - Eliquis off - CT negative for RP bleed - EGD with 2 ulcers not acutely bleeding.  - Hgb 9.3>>8.6 >> 8.5  today  - On protonix PO 40 mg bid  - Would not restart AC for 2 weeks to allow GI healing. Can try next week  - Biopsy back for H pylori. On treatment per GI  2. PAH with cor pulmonale - CT chest 4/22: No PE or ILD - Echo LVEF 60% severe RV dilation and HK - Cath with moderate PAH. PA = 66/31 (46) PCW = 13  Fick = 4.1/2.1 PVR = 7.8 WU - Suspect this is WHO Group 3 PAH due to hypoxic lung disease/OHS/OSA - Will order PFTs (when better compensated) and will need outpatient sleep study - Serologies for completed. Note abnormal ANA, with borderline elevation in chromatin antibody, but all other serologies negative. ANCA pendingESR only mildly abnormal at 44  - On sildenafil 40 mg tid.     3. Paroxysmal AF - new onset. Unclear duration  - Eliquis on hold d/t GI bleed - Was in NSR last week. Now back in AF x 48 hours  - No AC with recent large GI bleed - Continue SCDs.  - Continue PO amio 400 mg daily  - Remains in AF. Not candidate for DC-CV without AC. Hopefully can try to resume Eliquis next week. May be candidate for [facility] LAA closure if we can get him stable on [facility] for 1-2 months.Would avoid long-term amio use given underlying lung disease if at all possible.    4. CAD  -mostly non-obstructive - atorva 40 added  - No ASA with AC - No s/s anigna   5. R lung mass - will need CT f/u   6. Shock - Suspect mixed hemorrhagic/cardiogenic (RV failure).   - Resolved  7. Acute hypoxemic respiratory failure with bilateral pleural effusions - Intubated pre-EGD on 4/22.   - Extubated--> on HFNC - Respiratory status remains tenuous. As noted before, degree of hypoxemia far out of proportion to HF or PAH. Continue with  aggressive pulmonary toilet and mobilization. - With response to O2 supplementation shunt physiology less likely.  - Now s/p R thora with 1200 cc out. (mostly transudative)  - CXR still with airspace disease Would he benefit from chest PT or repeat bronch?  - volume status low with mild AKI. Hold torsemide today    Length of Stay: [place], MD  09/19/2020, 8:14 AM  Advanced Heart Failure Team Pager [phone] (M-F; 7a - 5p)  Please contact [facility] for night-coverage after hours (5p -7a ) and weekends on [email or web address]

## 2020-09-19 NOTE — Progress Notes (Signed)
  Speech Language Pathology Treatment: Dysphagia  Patient Details Name: Gary [name] MRN: [id number] DOB: February 09, 1951 Today's Date: 09/19/2020 Time: 9574-7340 SLP Time Calculation (min) (ACUTE ONLY): 27 min  Assessment / Plan / Recommendation Clinical Impression  Pt consumed regular solids, thin liquids, and mixed consistencies with no overt signs of dysphagia or aspiration. He and his wife continue to deny any difficulties during meals, and RN shares the same. Education was reinforced with pt, but at this point, he appears to be tolerating his current diet clinically. What would be left to offer from our standpoint would still be MBS, but he remains on too high of oxygen requirements to be able to do this study (although currently weaning today: 80% HHFNC at 15L). Therefore, SLP will sign off for now. If there is any ongoing concern for silent aspiration in the setting of other respiratory issues, recommend that MD considering ordering MBS once supplemental O2 can be further weaned.    HPI HPI: Pt is a 70 yo male presenting with progressive dyspnea. 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 He required intubation for acute GI bleed 4/22-4/26. EGD showed a large duodenal ulcer, Barrett's esophagus, small hiatal hernia. PMH includes: HTN, HLD, DM      SLP Plan  All goals met       Recommendations  Diet recommendations: Regular;Thin liquid Liquids provided via: Cup;Straw Medication Administration: Whole meds with liquid (one at a time) Supervision: Patient able to self feed;Intermittent supervision to cue for compensatory strategies Compensations: Slow rate;Small sips/bites;Other (Comment) (Rest breaks PRN for respiratory status) Postural Changes and/or Swallow Maneuvers: Seated upright 90 degrees                Oral Care Recommendations: Oral care BID Follow up Recommendations: None SLP Visit Diagnosis:  Dysphagia, unspecified (R13.10) Plan: All goals met       GO                Gary [name]., M.A. [facility] Pager [phone] Office [phone]  09/19/2020, 10:41 AM

## 2020-09-19 NOTE — Progress Notes (Signed)
Inpatient Rehab Admissions Coordinator:   Pt. Is not yet medically ready for CIR, but I note that he is on 15L of oxygen, titrated down from 30L. CIR can accept pt.'s on up to 6L so I will follow for progress and pursue admission if Pt. Becomes medically appropriate.   Clemens Catholic, [place], [place] Admissions Coordinator  [phone] ([place]) [phone] (office)

## 2020-09-19 NOTE — Progress Notes (Signed)
NAME:  Gary [name], MRN:  [id number], DOB:  November 06, 1950, LOS: 70 ADMISSION DATE:  09/03/2020, CONSULTATION DATE:  4/22 REFERRING MD:  Nori Riis, CHIEF COMPLAINT:  Dyspnea   History of Present Illness:  70 y/o male admitted with dyspnea and new oxygen requirements.  Found to have pulmonary hypertension. Hospitalization complicated by GI bleeding, went to the ICU and required intubation in setting of hemorrhagic and cardiogenic shock. Treated with dobutuamine and iloprost while intubated.  Extubated after 4 days.  Since extubation pulmonary has been following for persistently elevated oxygen requirements.    Pertinent  Medical History  Former smoker HTN DM Traumatic crush injury from a tree branch with multiple fractures Epistaxis requiring packing   Significant Hospital Events: Including procedures, antibiotic start and stop dates in addition to other pertinent events    4/18 admitted with SOB, echo with elevated right heart pressures, new finding of A. Fib  4/18 CT angiogram chest > small bilateral effusions, patchy infiltrates R lung, RML opacity, no PE  4/19 R/L heart cath with concern for pulmonary hypertension; Mean PA pressure 46, PCWP 13, PVR 7.8 WU  4/20 on 15L HFNC  4/21 hypotensive episode, responded to IVF, down to 5L O2  4/22 ABLA, to ICU, intubated, severe hypoxemia; EGD with nonbleeding duodenal ulcer  4/23 failed veletri wean  4/26 veletri weaned and pt extubated  4/27 transferred Out of ICU  4/29 PCCM re-consulted for recommendations regarding ongoing hypoxia and high oxygen demand. CCM ordering CXR, CTA chest  4/29 CT angiogram > no PE, progresive bilatearl perihilar airspace disease, worse in upper lobes, nodular consolidation in RML, R>L bilateral effusions . 5/3 R thoracentesis> 1.2 L fluid removed, exudate (1/3 light's criteria; pleural fluid LDH is > 2/3 upper limit of normal serum LDH, albumin gradient < 1), lymphocytic predominant  Interim History /  Subjective:  Feels better after thoracentesis Slept better last night  Objective   Blood pressure (!) 90/55, pulse 82, temperature 98.5 F (36.9 C), temperature source Oral, resp. rate 20, height 5' 7"  (1.702 m), weight 85 kg, SpO2 94 %. CVP:  [4 mmHg] 4 mmHg  FiO2 (%):  [80 %-90 %] 80 %   Intake/Output Summary (Last 24 hours) at 09/19/2020 1004 Last data filed at 09/19/2020 0730 Gross per 24 hour  Intake 957 ml  Output 5200 ml  Net -4243 ml   Filed Weights   09/17/20 0500 09/18/20 0500 09/19/20 0407  Weight: 87.8 kg 87.4 kg 85 kg    Examination:  General:  Sitting up in chair, resting comfortably HENT: NCAT OP clear PULM: Crackles bases B, normal effort CV: RRR, no mgr GI: BS+, soft, nontender MSK: normal bulk and tone Neuro: awake, alert, no distress, MAEW   Labs/imaging that I havepersonally reviewed  (right click and "Reselect all SmartList Selections" daily)  CT chest images personally reviewed, see my interpretation above Cr up today Anemic, normocytic, Hgb 8.5 coox 66  Resolved Hospital Problem list     Assessment & Plan:  Acute respiratory failure with hypoxemia in setting of pulmonary hypertension and bilateral pulmonary infiltrates with R>L pleural effusion Unclear what is going on in his pulmonary parenchyma, inflammatory infiltrate vs malignancy, some component of transfusion or drug related lung disease? WHO group 3 pulmonary hypertension Effusion> Given clinical picture I expected an exudate but he has elevated LDH, albumin gradient and significant lymphocytosis; again, unclear what is causing that > will order HRCT to better evaluate pulmonary parenchyma > diuretics per heart failure > continue bronchodilators  on inhaled corticosteroids for now as ordered > continue pulmonary revatio, consider adding back iloprost > consider high risk bronch if cytology from pleural fluid unrevealing  Atrial fibrillation Barret's esophagitis Gastritis, h.pylori  positive Former smoker CAD  Best practice (right click and "Reselect all SmartList Selections" daily)   Per primary  Labs   CBC: Recent Labs  Lab 09/15/20 0444 09/16/20 0325 09/17/20 0510 09/18/20 0500 09/19/20 0306  WBC 9.6 10.9* 10.5 9.4 9.8  NEUTROABS  --   --  8.7*  --   --   HGB 9.3* 9.3* 8.6* 8.2* 8.5*  HCT 28.8* 29.2* 27.0* 26.3* 26.7*  MCV 92.9 93.3 92.5 93.6 93.0  PLT 305 334 358 381 447*    Basic Metabolic Panel: Recent Labs  Lab 09/14/20 1440 09/15/20 0444 09/16/20 0325 09/17/20 0510 09/18/20 0500 09/19/20 0306  NA  --  134* 132* 136 135 133*  K  --  3.6 3.4* 3.4* 3.8 4.5  CL  --  92* 88* 91* 92* 89*  CO2  --  32 34* 34* 35* 35*  GLUCOSE  --  85 111* 103* 70 114*  BUN  --  22 23 19 18  28*  CREATININE  --  0.97 0.99 0.98 0.97 1.29*  CALCIUM  --  7.5* 7.5* 7.6* 7.4* 7.5*  MG 1.4*  --   --   --   --   --    GFR: Estimated Creatinine Clearance: 54.8 mL/min (A) (by C-G formula based on SCr of 1.29 mg/dL (H)). Recent Labs  Lab 09/16/20 0325 09/17/20 0510 09/18/20 0500 09/19/20 0306  WBC 10.9* 10.5 9.4 9.8    Liver Function Tests: Recent Labs  Lab 09/18/20 1310  PROT 5.7*  ALBUMIN 1.9*   No results for input(s): LIPASE, AMYLASE in the last 168 hours. No results for input(s): AMMONIA in the last 168 hours.  ABG    Component Value Date/Time   PHART 7.373 09/11/2020 2314   PCO2ART 58.2 (H) 09/11/2020 2314   PO2ART 67 (L) 09/11/2020 2314   HCO3 33.8 (H) 09/11/2020 2314   TCO2 35 (H) 09/11/2020 2314   ACIDBASEDEF 4.0 (H) 09/07/2020 2236   O2SAT 66.5 09/19/2020 0306     Coagulation Profile: No results for input(s): INR, PROTIME in the last 168 hours.  Cardiac Enzymes: No results for input(s): CKTOTAL, CKMB, CKMBINDEX, TROPONINI in the last 168 hours.  HbA1C: Hgb A1c MFr Bld  Date/Time Value Ref Range Status  09/03/2020 02:52 PM 6.7 (H) 4.8 - 5.6 % Final    Comment:    (NOTE) Pre diabetes:          5.7%-6.4%  Diabetes:               >6.4%  Glycemic control for   <7.0% adults with diabetes     CBG: Recent Labs  Lab 09/18/20 0539 09/18/20 1119 09/18/20 1600 09/18/20 2117 09/19/20 0609  GLUCAP 73 201* 138* 145* 96     Critical care time: n/a  > 35 minutes spent on this visit reviewing records, meeting with patient   Roselie Awkward, MD [place] [facility] Pager: [phone] Cell: [phone] If no response, please call [phone] until 7pm After 7:00 pm call Elink  [phone]

## 2020-09-19 NOTE — Telephone Encounter (Signed)
Patient Advocate Encounter   Received notification from [employer] that prior authorization for sildenafil is required.   PA submitted on CoverMyMeds Key [id number] Status is pending   Will continue to follow.   Audry Riles, PharmD, BCPS, BCCP, CPP Heart Failure Clinic Pharmacist [phone]

## 2020-09-20 ENCOUNTER — Inpatient Hospital Stay (HOSPITAL_COMMUNITY): Payer: Medicare Other

## 2020-09-20 DIAGNOSIS — I272 Pulmonary hypertension, unspecified: Secondary | ICD-10-CM | POA: Diagnosis not present

## 2020-09-20 DIAGNOSIS — J90 Pleural effusion, not elsewhere classified: Secondary | ICD-10-CM | POA: Diagnosis not present

## 2020-09-20 DIAGNOSIS — J80 Acute respiratory distress syndrome: Secondary | ICD-10-CM | POA: Diagnosis not present

## 2020-09-20 DIAGNOSIS — I4891 Unspecified atrial fibrillation: Secondary | ICD-10-CM | POA: Diagnosis not present

## 2020-09-20 DIAGNOSIS — J9601 Acute respiratory failure with hypoxia: Secondary | ICD-10-CM | POA: Diagnosis not present

## 2020-09-20 DIAGNOSIS — I50813 Acute on chronic right heart failure: Secondary | ICD-10-CM | POA: Diagnosis not present

## 2020-09-20 LAB — CBC
HCT: 26.9 % — ABNORMAL LOW (ref 39.0–52.0)
Hemoglobin: 8.4 g/dL — ABNORMAL LOW (ref 13.0–17.0)
MCH: 28.7 pg (ref 26.0–34.0)
MCHC: 31.2 g/dL (ref 30.0–36.0)
MCV: 91.8 fL (ref 80.0–100.0)
Platelets: 502 10*3/uL — ABNORMAL HIGH (ref 150–400)
RBC: 2.93 MIL/uL — ABNORMAL LOW (ref 4.22–5.81)
RDW: 14.9 % (ref 11.5–15.5)
WBC: 8.9 10*3/uL (ref 4.0–10.5)
nRBC: 0 % (ref 0.0–0.2)

## 2020-09-20 LAB — BASIC METABOLIC PANEL
Anion gap: 11 (ref 5–15)
BUN: 25 mg/dL — ABNORMAL HIGH (ref 8–23)
CO2: 30 mmol/L (ref 22–32)
Calcium: 7.7 mg/dL — ABNORMAL LOW (ref 8.9–10.3)
Chloride: 90 mmol/L — ABNORMAL LOW (ref 98–111)
Creatinine, Ser: 1.29 mg/dL — ABNORMAL HIGH (ref 0.61–1.24)
GFR, Estimated: 59 mL/min — ABNORMAL LOW (ref >60)
Glucose, Bld: 108 mg/dL — ABNORMAL HIGH (ref 70–99)
Potassium: 4.1 mmol/L (ref 3.5–5.1)
Sodium: 131 mmol/L — ABNORMAL LOW (ref 135–145)

## 2020-09-20 LAB — ANCA TITERS
Atypical P-ANCA titer: <1:20 {titer}
C-ANCA: <1:20 {titer}
P-ANCA: <1:20 {titer}

## 2020-09-20 LAB — GLUCOSE, CAPILLARY
Glucose-Capillary: 83 mg/dL (ref 70–99)
Glucose-Capillary: 140 mg/dL — ABNORMAL HIGH (ref 70–99)
Glucose-Capillary: 105 mg/dL — ABNORMAL HIGH (ref 70–99)
Glucose-Capillary: 176 mg/dL — ABNORMAL HIGH (ref 70–99)

## 2020-09-20 LAB — BRAIN NATRIURETIC PEPTIDE: B Natriuretic Peptide: 360.6 pg/mL — ABNORMAL HIGH (ref 0.0–100.0)

## 2020-09-20 MED ORDER — CALCIUM CARBONATE ANTACID 500 MG PO CHEW: 1.0000 | CHEWABLE_TABLET | Freq: Two times a day (BID) | ORAL | Status: DC

## 2020-09-20 MED ORDER — SODIUM CHLORIDE 0.9 % IV SOLN
250.0000 mg | Freq: Four times a day (QID) | INTRAVENOUS | Status: DC
  Administered 2020-09-20 – 2020-09-24 (×15): 250 mg via INTRAVENOUS
  Filled 2020-09-20 (×17): qty 2

## 2020-09-20 NOTE — Progress Notes (Signed)
Patient transported to CT and back on bipap without complications. Patient placed back [place]. RN at bedside.

## 2020-09-20 NOTE — Progress Notes (Signed)
NAME:  Gary [name], MRN:  [id number], DOB:  Sep 22, 1950, LOS: 39 ADMISSION DATE:  09/03/2020, CONSULTATION DATE:  4/22 REFERRING MD:  Nori Riis, CHIEF COMPLAINT:  Dyspnea   History of Present Illness:  70 y/o male admitted with dyspnea and new oxygen requirements.  Found to have pulmonary hypertension. Hospitalization complicated by GI bleeding, went to the ICU and required intubation in setting of hemorrhagic and cardiogenic shock. Treated with dobutuamine and iloprost while intubated.  Extubated after 4 days.  Since extubation pulmonary has been following for persistently elevated oxygen requirements.    Pertinent  Medical History  Former smoker HTN DM Traumatic crush injury from a tree branch with multiple fractures Epistaxis requiring packing   Significant Hospital Events: Including procedures, antibiotic start and stop dates in addition to other pertinent events    4/18 admitted with SOB, echo with elevated right heart pressures, new finding of A. Fib  4/18 CT angiogram chest > small bilateral effusions, patchy infiltrates R lung, RML opacity, no PE  4/19 R/L heart cath with concern for pulmonary hypertension; Mean PA pressure 46, PCWP 13, PVR 7.8 WU  4/20 on 15L HFNC  4/21 hypotensive episode, responded to IVF, down to 5L O2  4/22 ABLA, to ICU, intubated, severe hypoxemia; EGD with nonbleeding duodenal ulcer  4/23 failed veletri wean  4/26 veletri weaned and pt extubated  4/27 transferred Out of ICU  4/29 PCCM re-consulted for recommendations regarding ongoing hypoxia and high oxygen demand. CCM ordering CXR, CTA chest  4/29 CT angiogram > no PE, progresive bilatearl perihilar airspace disease, worse in upper lobes, nodular consolidation in RML, R>L bilateral effusions . 5/3 R thoracentesis> 1.2 L fluid removed, exudate (1/3 light's criteria; pleural fluid LDH is > 2/3 upper limit of normal serum LDH, albumin gradient < 1), lymphocytic predominant  Interim History /  Subjective:  Feels better.   Objective   Blood pressure 93/68, pulse 89, temperature 97.6 F (36.4 C), temperature source Oral, resp. rate 20, height 5' 7"  (1.702 m), weight 83 kg, SpO2 90 %. CVP:  [4 mmHg] 4 mmHg  FiO2 (%):  [60 %-80 %] 60 %   Intake/Output Summary (Last 24 hours) at 09/20/2020 1159 Last data filed at 09/20/2020 1100 Gross per 24 hour  Intake 840 ml  Output 2425 ml  Net -1585 ml   Filed Weights   09/18/20 0500 09/19/20 0407 09/20/20 0300  Weight: 87.4 kg 85 kg 83 kg    Examination:  General this is a 70 year old male sitting up in bed. Not in acute distress HENT NCAT no JVD Pulm bilateral posterior rales. No accessory use still on 60 Liters high flow Card reg irreg abd soft Ext warm no sig swelling Neuro intact   Labs/imaging that I havepersonally reviewed  (right click and "Reselect all SmartList Selections" daily)  See below. CT was reviewed. Looks fairly similar to prior CT imaging. Will defer to Dr Carlis Abbott   [facility] Problem list     Assessment & Plan:   Acute respiratory failure with hypoxemia in setting of pulmonary hypertension and bilateral pulmonary infiltrates with R>L pleural effusion R>L exudative pleural effusions  WHO group 3 pulm hypertension Atrial fibrillation Barret's esophagitis Gastritis, h.pylori positive Former smoker CAD  Pulm dx Acute respiratory failure with hypoxemia in setting of pulmonary hypertension and bilateral pulmonary infiltrates with R>L pleural effusion R>L exudative pleural effusions  WHO group 3 pulm hypertension  Discussion Unclear what is going on in his pulmonary parenchyma, inflammatory infiltrate vs  malignancy, some component of transfusion or drug related lung disease? Exudative right Effusion (by light's criteria and albumin gradient) cytology was negative. CT chest w/ fairly similar appearance as it had on 4/29 w/ patchy GG changes and sm-mod right effusion Autoimmune neg work up and so far  the anca ab neg and histone AB neg.   Plan/rec Cont IV diuresis as BUN/cr tolerate Cont BDs and ICS as ordered for now  Cont Revatio Cont supplemental oxygen Consider adding back Prostacyclin or trying Ventavis  Do need to consider malignancy as well but would be high risk FOB    Best practice (right click and "Reselect all SmartList Selections" daily)   Erick Colace ACNP-BC [place] Pager # [phone] OR # [phone] if no answer

## 2020-09-20 NOTE — Progress Notes (Addendum)
Patient ID: Gary [name], male   DOB: March 21, 1951, 70 y.o.   MRN: [id number]     Advanced Heart Failure Rounding Note  PCP-Cardiologist: Elouise Munroe, MD   Subjective:   Events  4/22  EGD 4/22 showed 2 ulcers in duodenum that were not actively bleeding.  On PO Protonix, Hgb 10>9.3>8.6  4/23 Intubated. Dobutamine stopped due to marked tachycardia. Started antibiotics for pneumonia. Blood CX- NGTD. Sputum with strep.  4/24 Did not tolerate fast veletri wean.  4/25 Veletri stopped. Extubated. Given 1u PRBC  5/1 Amio gtt stopped>>PO 400 mg daily  5/3 Right thoracentesis with 1200cc out (mostly transudative)  Post thora (and diuresis) CXR still with significant airspace disease. Remains on HFNC. CT scan performed this am. Interpretation pending.   Remains in rate-controlled  AFL on po amio.  Diuretics held yesterday w/ bump in SCr/ low volume status. Wt down. SCr stable at 1.29.   Didn't sleep well last night. No other complaints.     Objective:   Weight Range: 83 kg Body mass index is 28.66 kg/m.   Vital Signs:   Temp:  [97.6 F (36.4 C)-98.6 F (37 C)] 97.8 F (36.6 C) (05/05 0709) Pulse Rate:  [82-94] 84 (05/05 0709) Resp:  [17-26] 20 (05/05 0709) BP: (86-103)/(44-62) 92/59 (05/05 0709) SpO2:  [89 %-94 %] 91 % (05/05 0832) FiO2 (%):  [60 %-80 %] 60 % (05/05 0832) Weight:  [83 kg] 83 kg (05/05 0300) Last BM Date: 09/19/20  Weight change: Filed Weights   09/18/20 0500 09/19/20 0407 09/20/20 0300  Weight: 87.4 kg 85 kg 83 kg    Intake/Output:   Intake/Output Summary (Last 24 hours) at 09/20/2020 0923 Last data filed at 09/20/2020 0500 Gross per 24 hour  Intake 840 ml  Output 2100 ml  Net -1260 ml      Physical Exam   General:  Mildly fatigued appearing, sitting up in bed. On HFNC. No increased work of breathing  HEENT: normal Neck: supple. no JVD. Carotids 2+ bilat; no bruits. No lymphadenopathy or thryomegaly appreciated. Cor: PMI nondisplaced. Irregular  rate & rhythm. No rubs, gallops or murmurs. Lungs: decreased bs at the bases bilaterally w/ diffuse + crackles  Abdomen: obese soft, nontender, nondistended. No hepatosplenomegaly. No bruits or masses. Good bowel sounds. Extremities: no cyanosis, clubbing, rash, edema Neuro: alert & orientedx3, cranial nerves grossly intact. moves all 4 extremities w/o difficulty. Affect pleasant   Telemetry    AFL 80-90s Personally reviewed  Labs    CBC Recent Labs    09/19/20 0306 09/20/20 0047  WBC 9.8 8.9  HGB 8.5* 8.4*  HCT 26.7* 26.9*  MCV 93.0 91.8  PLT 447* 448*   Basic Metabolic Panel Recent Labs    09/19/20 0306 09/20/20 0047  NA 133* 131*  K 4.5 4.1  CL 89* 90*  CO2 35* 30  GLUCOSE 114* 108*  BUN 28* 25*  CREATININE 1.29* 1.29*  CALCIUM 7.5* 7.7*   Liver Function Tests Recent Labs    09/18/20 1310  PROT 5.7*  ALBUMIN 1.9*   No results for input(s): LIPASE, AMYLASE in the last 72 hours. Cardiac Enzymes No results for input(s): CKTOTAL, CKMB, CKMBINDEX, TROPONINI in the last 72 hours.  BNP: BNP (last 3 results) Recent Labs    09/03/20 1433  BNP 1,202.7*    ProBNP (last 3 results) No results for input(s): PROBNP in the last 8760 hours.   D-Dimer No results for input(s): DDIMER in the last 72 hours. Hemoglobin A1C No  results for input(s): HGBA1C in the last 72 hours. Fasting Lipid Panel No results for input(s): CHOL, HDL, LDLCALC, TRIG, CHOLHDL, LDLDIRECT in the last 72 hours. Thyroid Function Tests No results for input(s): TSH, T4TOTAL, T3FREE, THYROIDAB in the last 72 hours.  Invalid input(s): FREET3  Other results:   Imaging    No results found.   Medications:     Scheduled Medications: . sodium chloride   Intravenous Once  . amiodarone  400 mg Oral Daily  . amoxicillin  1,000 mg Oral Q12H  . arformoterol  15 mcg Nebulization BID  . atorvastatin  40 mg Oral Daily  . budesonide  0.5 mg Nebulization BID  . chlorhexidine  15 mL Mouth  Rinse BID  . Chlorhexidine Gluconate Cloth  6 each Topical Daily  . clarithromycin  500 mg Oral Q12H  . empagliflozin  10 mg Oral Daily  . feeding supplement (GLUCERNA SHAKE)  237 mL Oral TID BM  . ferrous sulfate  325 mg Oral Q breakfast  . insulin glargine  10 Units Subcutaneous Daily  . mouth rinse  15 mL Mouth Rinse BID  . mouth rinse  15 mL Mouth Rinse q12n4p  . metFORMIN  1,000 mg Oral BID WC  . midodrine  10 mg Oral TID WC  . multivitamin with minerals  1 tablet Oral Daily  . pantoprazole  40 mg Oral BID  . polyethylene glycol  17 g Oral Daily  . revefenacin  175 mcg Nebulization Daily  . saline  1 application Each Nare BID  . sildenafil  40 mg Oral TID  . sodium chloride flush  10-40 mL Intracatheter Q12H    Infusions: . sodium chloride Stopped (09/10/20 1336)  . sodium chloride 10 mL/hr at 09/13/20 0900    PRN Medications: sodium chloride, acetaminophen, guaiFENesin-dextromethorphan, ondansetron (ZOFRAN) IV, sodium chloride, sodium chloride flush    Assessment/Plan    1. Acute upper GI bleed with symptomatic anemia - hgb dropped to 6.7. Transfused.  - Eliquis off - CT negative for RP bleed - EGD with 2 ulcers not acutely bleeding.  - Hgb 9.3>>8.6 >> 8.5>>8.4  today  - On protonix PO 40 mg bid  - Would not restart AC for 2 weeks to allow GI healing. Can try next week  - Biopsy back for H pylori. On treatment per GI  2. PAH with cor pulmonale - CT chest 4/22: No PE or ILD - Echo LVEF 60% severe RV dilation and HK - Cath with moderate PAH. PA = 66/31 (46) PCW = 13 Fick = 4.1/2.1 PVR = 7.8 WU - Suspect this is WHO Group 3 PAH due to hypoxic lung disease/OHS/OSA - Will order PFTs (when better compensated) and will need outpatient sleep study - Serologies for completed. Note abnormal ANA, with borderline elevation in chromatin antibody, but all other serologies negative. ANCA pendingESR only mildly abnormal at 44  - On sildenafil 40 mg tid.     3. Paroxysmal  AF - new onset. Unclear duration  - Eliquis on hold d/t GI bleed - Was in NSR last week. Now back in AF x 48 hours  - No AC with recent large GI bleed - Continue SCDs.  - Continue PO amio 400 mg daily  - Remains in AF. Not candidate for DC-CV without AC. Hopefully can try to resume Eliquis next week. May be candidate for Watchman LAA closure if we can get him stable on [facility] for 1-2 months. Would avoid long-term amio use given underlying lung  disease if at all possible.    4. CAD  -mostly non-obstructive - atorva 40 added  - No ASA with AC - No s/s anigna   5. R lung mass - will need CT f/u   6. Shock - Suspect mixed hemorrhagic/cardiogenic (RV failure).   - Resolved  7. Acute hypoxemic respiratory failure with bilateral pleural effusions - Intubated pre-EGD on 4/22.   - Extubated--> on HFNC - Respiratory status remains tenuous. As noted before, degree of hypoxemia far out of proportion to HF or PAH. Continue with aggressive pulmonary toilet and mobilization. - With response to O2 supplementation shunt physiology less likely.  - Now s/p R thora with 1200 cc out. (mostly transudative)  - CXR still with airspace disease Would he benefit from chest PT or repeat bronch?  - Chest CT today, interpretation pending  - volume status low with mild AKI. Hold torsemide again today    Length of Stay: [address], PA-C  09/20/2020, 9:23 AM  Advanced Heart Failure Team Pager [phone] (M-F; 7a - 5p)  Please contact [facility] for night-coverage after hours (5p -7a ) and weekends on [email or web address]   Patient seen and examined with the above-signed Advanced Practice Provider and/or Housestaff. I personally reviewed laboratory data, imaging studies and relevant notes. I independently examined the patient and formulated the important aspects of the plan. I have edited the note to reflect any of my changes or salient points. I have personally discussed the plan with the patient and/or  family.  Respiratory status remains tenuous. I reviewed Hi-res CT and airspace disease seems to be improving (await final read). Volume status ok. Remain in rate controlled AF.   General:  Fatigued appearing. Sitting up on HFNC HEENT: normal Neck: supple. no JVD. Carotids 2+ bilat; no bruits. No lymphadenopathy or thryomegaly appreciated. Cor: PMI nondisplaced. Regular rate & rhythm. No rubs, gallops or murmurs. Lungs: coarse Abdomen: obese soft, nontender, nondistended. No hepatosplenomegaly. No bruits or masses. Good bowel sounds. Extremities: no cyanosis, clubbing, rash, edema Neuro: alert & orientedx3, cranial nerves grossly intact. moves all 4 extremities w/o difficulty. Affect pleasant  Still with significant O2 requirement. I think he is as good as we can get him from cardiac standpoint. Await official read on CT. Hgb stable consider rechallenge with Eliquis next week. Needs aggressive pulmonary toilet.  Glori Bickers, MD  11:52 AM

## 2020-09-20 NOTE — Plan of Care (Signed)
  Problem: Education: Goal: Knowledge of General Education information will improve Description: Including pain rating scale, medication(s)/side effects and non-pharmacologic comfort measures Outcome: Progressing   Problem: Health Behavior/Discharge Planning: Goal: Ability to manage health-related needs will improve Outcome: Progressing   Problem: Clinical Measurements: Goal: Ability to maintain clinical measurements within normal limits will improve Outcome: Progressing Goal: Will remain free from infection Outcome: Progressing Goal: Diagnostic test results will improve Outcome: Progressing Goal: Respiratory complications will improve Outcome: Progressing Goal: Cardiovascular complication will be avoided Outcome: Progressing   Problem: Activity: Goal: Risk for activity intolerance will decrease Outcome: Progressing   Problem: Nutrition: Goal: Adequate nutrition will be maintained Outcome: Progressing   Problem: Coping: Goal: Level of anxiety will decrease Outcome: Progressing   Problem: Elimination: Goal: Will not experience complications related to bowel motility Outcome: Progressing Goal: Will not experience complications related to urinary retention Outcome: Progressing   Problem: Pain Managment: Goal: General experience of comfort will improve Outcome: Progressing   Problem: Safety: Goal: Ability to remain free from injury will improve Outcome: Progressing   Problem: Skin Integrity: Goal: Risk for impaired skin integrity will decrease Outcome: Progressing   Problem: Education: Goal: Ability to demonstrate management of disease process will improve Outcome: Progressing Goal: Ability to verbalize understanding of medication therapies will improve Outcome: Progressing Goal: Individualized Educational Video(s) Outcome: Progressing   Problem: Activity: Goal: Capacity to carry out activities will improve Outcome: Progressing   Problem: Cardiac: Goal:  Ability to achieve and maintain adequate cardiopulmonary perfusion will improve Outcome: Progressing   Problem: Activity: Goal: Ability to tolerate increased activity will improve Outcome: Progressing   Problem: Respiratory: Goal: Ability to maintain a clear airway and adequate ventilation will improve Outcome: Progressing

## 2020-09-20 NOTE — Progress Notes (Signed)
Lucina Mellow made aware of the discontinue of central line order for this patient.  States she will let the primary RN know.  Delilah Shan Navaeh Kehres,RN-VAST

## 2020-09-21 DIAGNOSIS — I509 Heart failure, unspecified: Secondary | ICD-10-CM | POA: Diagnosis not present

## 2020-09-21 DIAGNOSIS — E785 Hyperlipidemia, unspecified: Secondary | ICD-10-CM

## 2020-09-21 DIAGNOSIS — I4891 Unspecified atrial fibrillation: Secondary | ICD-10-CM | POA: Diagnosis not present

## 2020-09-21 DIAGNOSIS — J90 Pleural effusion, not elsewhere classified: Secondary | ICD-10-CM | POA: Diagnosis not present

## 2020-09-21 DIAGNOSIS — R918 Other nonspecific abnormal finding of lung field: Secondary | ICD-10-CM | POA: Diagnosis not present

## 2020-09-21 DIAGNOSIS — J80 Acute respiratory distress syndrome: Secondary | ICD-10-CM | POA: Diagnosis not present

## 2020-09-21 DIAGNOSIS — J9601 Acute respiratory failure with hypoxia: Secondary | ICD-10-CM | POA: Diagnosis not present

## 2020-09-21 DIAGNOSIS — E1169 Type 2 diabetes mellitus with other specified complication: Secondary | ICD-10-CM

## 2020-09-21 LAB — GLUCOSE, CAPILLARY
Glucose-Capillary: 215 mg/dL — ABNORMAL HIGH (ref 70–99)
Glucose-Capillary: 331 mg/dL — ABNORMAL HIGH (ref 70–99)
Glucose-Capillary: 260 mg/dL — ABNORMAL HIGH (ref 70–99)
Glucose-Capillary: 204 mg/dL — ABNORMAL HIGH (ref 70–99)

## 2020-09-21 LAB — CBC
HCT: 31.5 % — ABNORMAL LOW (ref 39.0–52.0)
Hemoglobin: 10 g/dL — ABNORMAL LOW (ref 13.0–17.0)
MCH: 28.6 pg (ref 26.0–34.0)
MCHC: 31.7 g/dL (ref 30.0–36.0)
MCV: 90 fL (ref 80.0–100.0)
Platelets: 609 10*3/uL — ABNORMAL HIGH (ref 150–400)
RBC: 3.5 MIL/uL — ABNORMAL LOW (ref 4.22–5.81)
RDW: 15 % (ref 11.5–15.5)
WBC: 5 10*3/uL (ref 4.0–10.5)
nRBC: 0 % (ref 0.0–0.2)

## 2020-09-21 LAB — BASIC METABOLIC PANEL
Anion gap: 12 (ref 5–15)
BUN: 28 mg/dL — ABNORMAL HIGH (ref 8–23)
CO2: 26 mmol/L (ref 22–32)
Calcium: 8.1 mg/dL — ABNORMAL LOW (ref 8.9–10.3)
Chloride: 92 mmol/L — ABNORMAL LOW (ref 98–111)
Creatinine, Ser: 1.2 mg/dL (ref 0.61–1.24)
GFR, Estimated: >60 mL/min (ref >60)
Glucose, Bld: 198 mg/dL — ABNORMAL HIGH (ref 70–99)
Potassium: 5.2 mmol/L — ABNORMAL HIGH (ref 3.5–5.1)
Sodium: 130 mmol/L — ABNORMAL LOW (ref 135–145)

## 2020-09-21 LAB — CHOLESTEROL, BODY FLUID: Cholesterol, Fluid: 23 mg/dL

## 2020-09-21 MED ORDER — INSULIN ASPART 100 UNIT/ML IJ SOLN
0.0000 [IU] | Freq: Three times a day (TID) | INTRAMUSCULAR | Status: DC
  Administered 2020-09-22 (×2): 3 [IU] via SUBCUTANEOUS
  Administered 2020-09-22: 8 [IU] via SUBCUTANEOUS
  Administered 2020-09-23: 3 [IU] via SUBCUTANEOUS
  Administered 2020-09-23: 5 [IU] via SUBCUTANEOUS
  Administered 2020-09-23: 8 [IU] via SUBCUTANEOUS
  Administered 2020-09-24 – 2020-09-25 (×3): 3 [IU] via SUBCUTANEOUS
  Administered 2020-09-25: 2 [IU] via SUBCUTANEOUS
  Administered 2020-09-25 – 2020-09-26 (×2): 5 [IU] via SUBCUTANEOUS
  Administered 2020-09-26 – 2020-09-27 (×2): 3 [IU] via SUBCUTANEOUS
  Administered 2020-09-27: 8 [IU] via SUBCUTANEOUS
  Administered 2020-09-28 (×2): 5 [IU] via SUBCUTANEOUS
  Administered 2020-09-29: 8 [IU] via SUBCUTANEOUS
  Administered 2020-09-29: 5 [IU] via SUBCUTANEOUS
  Administered 2020-09-30: 3 [IU] via SUBCUTANEOUS
  Administered 2020-09-30: 5 [IU] via SUBCUTANEOUS
  Administered 2020-09-30: 2 [IU] via SUBCUTANEOUS
  Administered 2020-10-01 (×2): 3 [IU] via SUBCUTANEOUS
  Administered 2020-10-01: 5 [IU] via SUBCUTANEOUS
  Administered 2020-10-02: 2 [IU] via SUBCUTANEOUS
  Administered 2020-10-02: 5 [IU] via SUBCUTANEOUS

## 2020-09-21 MED ORDER — FUROSEMIDE 10 MG/ML IJ SOLN
40.0000 mg | Freq: Once | INTRAMUSCULAR | Status: AC
  Administered 2020-09-21: 40 mg via INTRAVENOUS
  Filled 2020-09-21: qty 4

## 2020-09-21 MED ORDER — AMIODARONE HCL 200 MG PO TABS
200.0000 mg | ORAL_TABLET | Freq: Every day | ORAL | Status: DC
  Administered 2020-09-22 – 2020-10-02 (×11): 200 mg via ORAL
  Filled 2020-09-21 (×11): qty 1

## 2020-09-21 MED ORDER — INSULIN GLARGINE 100 UNIT/ML ~~LOC~~ SOLN
15.0000 [IU] | Freq: Every day | SUBCUTANEOUS | Status: DC
  Administered 2020-09-22 – 2020-09-23 (×2): 15 [IU] via SUBCUTANEOUS
  Filled 2020-09-21 (×3): qty 0.15

## 2020-09-21 MED ORDER — INSULIN GLARGINE 100 UNIT/ML ~~LOC~~ SOLN
5.0000 [IU] | Freq: Once | SUBCUTANEOUS | Status: AC
  Administered 2020-09-21: 5 [IU] via SUBCUTANEOUS
  Filled 2020-09-21: qty 0.05

## 2020-09-21 MED ORDER — INSULIN ASPART 100 UNIT/ML IJ SOLN
6.0000 [IU] | Freq: Three times a day (TID) | INTRAMUSCULAR | Status: DC
  Administered 2020-09-22 – 2020-09-25 (×10): 6 [IU] via SUBCUTANEOUS

## 2020-09-21 MED ORDER — INSULIN ASPART 100 UNIT/ML IJ SOLN
0.0000 [IU] | Freq: Three times a day (TID) | INTRAMUSCULAR | Status: DC
  Administered 2020-09-21: 8 [IU] via SUBCUTANEOUS

## 2020-09-21 MED ORDER — INSULIN ASPART 100 UNIT/ML IJ SOLN
0.0000 [IU] | Freq: Every day | INTRAMUSCULAR | Status: DC
  Administered 2020-09-21: 2 [IU] via SUBCUTANEOUS

## 2020-09-21 MED ORDER — INSULIN ASPART 100 UNIT/ML IJ SOLN: 0.0000 [IU] | Freq: Every day | INTRAMUSCULAR | Status: DC

## 2020-09-21 NOTE — Progress Notes (Signed)
Interim progress note  Subjective: In to see patient after noticing patient changed to MEWS 5.  This was due to tachycardia, hypotension, tachypnea.  Patient reports feeling well, has no complaints at this time.   Objective Gen: Patient is sitting in recliner, appears comfortable. Resp: On HFNC at 25 L, 60% FiO2, breathing comfortably in no distress Cardio: Irregularly irregular rate and rhythm, tachycardic on the cardiac monitor ranging 101-113 in room  Assessment/Plan 70 year old male with hypoxemic respiratory failure concerning for primary lung malignancy.  Status post thoracentesis 5/3. He is on high-dose steroids now.  Despite his high oxygen requirement, he feels well, reassuringly.  He is back in atrial fibrillation, now with increased heart rates.  He is asymptomatic at this time.  Given his hypotension, holding off on addition of Toprol-XL.  Should patient become symptomatic or with increased heart rates, will reach out to cardiology for any further recommendations.  Per their prior note stated consideration of Toprol-XL for rate control.  Given that he had a history of GI bleed this admission, will also monitor CBC in the a.m.  Hemoglobin this morning was reassuring at 10. -Continue cardiac monitoring, pulse ox -EKG ordered -CBC in AM to monitor for anemia -If acutely worsens, consider work-up for recurrent GI bleed, addition of Toprol-XL for rate control  -Holding anticoagulation at this time- plan for possible repeat thoracentesis tomorrow pending CXR results -Adding mealtime coverage for steroid-induced hyperglycemia -Other plan per prior note

## 2020-09-21 NOTE — Progress Notes (Signed)
Inpatient Diabetes Program Recommendations  AACE/ADA: New Consensus Statement on Inpatient Glycemic Control (2015)  Target Ranges:  Prepandial:   less than 140 mg/dL      Peak postprandial:   less than 180 mg/dL (1-2 hours)      Critically ill patients:  140 - 180 mg/dL   Lab Results  Component Value Date   GLUCAP 331 (H) 09/21/2020   HGBA1C 6.7 (H) 09/03/2020    Review of Glycemic Control Results for DEYONTE, CADDEN (MRN [id number]) as of 09/21/2020 11:51  Ref. Range 09/20/2020 21:02 09/21/2020 06:11 09/21/2020 11:12  Glucose-Capillary Latest Ref Range: 70 - 99 mg/dL 176 (H) 215 (H) 331 (H)   Diabetes history: DM 2 Outpatient Diabetes medications:  Glucotrol XL 10 mg daily, Actos 15 mg bid, Metformin 1700 mg q AM and 850 mg q PM Current orders for Inpatient glycemic control:  Lantus 15 units daily Metformin 1000 mg bid Solumedrol 150 mg IV q 6 hours Inpatient Diabetes Program Recommendations:    Note patient is on high dose steroids.  Please add Novolog moderate q 4 hours.    Thanks,  Adah Perl, RN, BC-ADM Inpatient Diabetes Coordinator Pager [phone] (8a-5p)

## 2020-09-21 NOTE — Plan of Care (Signed)
  Problem: Education: Goal: Knowledge of General Education information will improve Description: Including pain rating scale, medication(s)/side effects and non-pharmacologic comfort measures Outcome: Progressing   Problem: Health Behavior/Discharge Planning: Goal: Ability to manage health-related needs will improve Outcome: Progressing   Problem: Clinical Measurements: Goal: Ability to maintain clinical measurements within normal limits will improve Outcome: Progressing Goal: Will remain free from infection Outcome: Progressing Goal: Diagnostic test results will improve Outcome: Progressing Goal: Respiratory complications will improve Outcome: Progressing Goal: Cardiovascular complication will be avoided Outcome: Progressing   Problem: Activity: Goal: Risk for activity intolerance will decrease Outcome: Progressing   Problem: Nutrition: Goal: Adequate nutrition will be maintained Outcome: Progressing   Problem: Coping: Goal: Level of anxiety will decrease Outcome: Progressing   Problem: Elimination: Goal: Will not experience complications related to bowel motility Outcome: Progressing Goal: Will not experience complications related to urinary retention Outcome: Progressing   Problem: Pain Managment: Goal: General experience of comfort will improve Outcome: Progressing   Problem: Safety: Goal: Ability to remain free from injury will improve Outcome: Progressing   Problem: Skin Integrity: Goal: Risk for impaired skin integrity will decrease Outcome: Progressing   Problem: Education: Goal: Ability to demonstrate management of disease process will improve Outcome: Progressing Goal: Ability to verbalize understanding of medication therapies will improve Outcome: Progressing Goal: Individualized Educational Video(s) Outcome: Progressing   Problem: Activity: Goal: Capacity to carry out activities will improve Outcome: Progressing   Problem: Cardiac: Goal:  Ability to achieve and maintain adequate cardiopulmonary perfusion will improve Outcome: Progressing   Problem: Activity: Goal: Ability to tolerate increased activity will improve Outcome: Progressing   Problem: Respiratory: Goal: Ability to maintain a clear airway and adequate ventilation will improve Outcome: Progressing   Problem: Role Relationship: Goal: Method of communication will improve Outcome: Progressing

## 2020-09-21 NOTE — Progress Notes (Addendum)
Patient ID: Gary [name], male   DOB: [birth year], 70 y.o.   MRN: [id number]     Advanced Heart Failure Rounding Note  PCP-Cardiologist: Elouise Munroe, MD   Subjective:   Events  4/22  EGD 4/22 showed 2 ulcers in duodenum that were not actively bleeding.  On PO Protonix, Hgb 10>9.3>8.6  4/23 Intubated. Dobutamine stopped due to marked tachycardia. Started antibiotics for pneumonia. Blood CX- NGTD. Sputum with strep.  4/24 Did not tolerate fast veletri wean.  4/25 Veletri stopped. Extubated. Given 1u PRBC  5/1 Amio gtt stopped>>PO 400 mg daily  5/3 Right thoracentesis with 1200cc out (mostly transudative) 5/5 In A fib . High Res CT- redemonstrated spiculated mass anterior RML with trace left pleural effusion.  5/5 Started on pulse-dose steroids  On HFNC 25 liters /60%   Remains in A fib 90-110s on po amio.   Feels better today. Able to walk a few steps.     Objective:   Weight Range: 83 kg Body mass index is 28.66 kg/m.   Vital Signs:   Temp:  [97.6 F (36.4 C)-98.5 F (36.9 C)] 98.5 F (36.9 C) (05/06 0335) Pulse Rate:  [89-117] 100 (05/06 0335) Resp:  [17-27] 20 (05/06 0335) BP: (93-108)/(60-72) 100/71 (05/06 0335) SpO2:  [88 %-94 %] 90 % (05/06 0846) FiO2 (%):  [60 %] 60 % (05/06 0845) Weight:  [83 kg] 83 kg (05/06 0621) Last BM Date: 09/19/20  Weight change: Filed Weights   09/19/20 0407 09/20/20 0300 09/21/20 0621  Weight: 85 kg 83 kg 83 kg    Intake/Output:   Intake/Output Summary (Last 24 hours) at 09/21/2020 1013 Last data filed at 09/21/2020 9622 Gross per 24 hour  Intake 634 ml  Output 2750 ml  Net -2116 ml      Physical Exam  General:  Sitting in the chair. No resp difficulty HEENT: normal Neck: supple. JVP ~10. Carotids 2+ bilat; no bruits. No lymphadenopathy or thryomegaly appreciated. Cor: PMI nondisplaced. Irregular rate & rhythm. No rubs, gallops or murmurs. Lungs: Crackles in the bases on HFNC Abdomen: soft, nontender, nondistended. No  hepatosplenomegaly. No bruits or masses. Good bowel sounds. Extremities: no cyanosis, clubbing, rash, edema Neuro: alert & orientedx3, cranial nerves grossly intact. moves all 4 extremities w/o difficulty. Affect pleasant   Telemetry    A Fib 80-110s personally reviewed.   Labs    CBC Recent Labs    09/20/20 0047 09/21/20 0120  WBC 8.9 5.0  HGB 8.4* 10.0*  HCT 26.9* 31.5*  MCV 91.8 90.0  PLT 502* 297*   Basic Metabolic Panel Recent Labs    09/20/20 0047 09/21/20 0120  NA 131* 130*  K 4.1 5.2*  CL 90* 92*  CO2 30 26  GLUCOSE 108* 198*  BUN 25* 28*  CREATININE 1.29* 1.20  CALCIUM 7.7* 8.1*   Liver Function Tests Recent Labs    09/18/20 1310  PROT 5.7*  ALBUMIN 1.9*   No results for input(s): LIPASE, AMYLASE in the last 72 hours. Cardiac Enzymes No results for input(s): CKTOTAL, CKMB, CKMBINDEX, TROPONINI in the last 72 hours.  BNP: BNP (last 3 results) Recent Labs    09/03/20 1433 09/20/20 0047  BNP 1,202.7* 360.6*    ProBNP (last 3 results) No results for input(s): PROBNP in the last 8760 hours.   D-Dimer No results for input(s): DDIMER in the last 72 hours. Hemoglobin A1C No results for input(s): HGBA1C in the last 72 hours. Fasting Lipid Panel No results for input(s): CHOL, HDL,  LDLCALC, TRIG, CHOLHDL, LDLDIRECT in the last 72 hours. Thyroid Function Tests No results for input(s): TSH, T4TOTAL, T3FREE, THYROIDAB in the last 72 hours.  Invalid input(s): FREET3  Other results:   Imaging    No results found.   Medications:     Scheduled Medications: . sodium chloride   Intravenous Once  . amiodarone  400 mg Oral Daily  . amoxicillin  1,000 mg Oral Q12H  . arformoterol  15 mcg Nebulization BID  . atorvastatin  40 mg Oral Daily  . budesonide  0.5 mg Nebulization BID  . chlorhexidine  15 mL Mouth Rinse BID  . clarithromycin  500 mg Oral Q12H  . empagliflozin  10 mg Oral Daily  . feeding supplement (GLUCERNA SHAKE)  237 mL Oral  TID BM  . ferrous sulfate  325 mg Oral Q breakfast  . insulin glargine  10 Units Subcutaneous Daily  . mouth rinse  15 mL Mouth Rinse BID  . mouth rinse  15 mL Mouth Rinse q12n4p  . metFORMIN  1,000 mg Oral BID WC  . midodrine  10 mg Oral TID WC  . multivitamin with minerals  1 tablet Oral Daily  . pantoprazole  40 mg Oral BID  . polyethylene glycol  17 g Oral Daily  . revefenacin  175 mcg Nebulization Daily  . saline  1 application Each Nare BID  . sildenafil  40 mg Oral TID  . sodium chloride flush  10-40 mL Intracatheter Q12H    Infusions: . sodium chloride Stopped (09/10/20 1336)  . sodium chloride 10 mL/hr at 09/13/20 0900  . methylPREDNISolone (SOLU-MEDROL) injection 250 mg (09/21/20 0611)    PRN Medications: sodium chloride, acetaminophen, guaiFENesin-dextromethorphan, ondansetron (ZOFRAN) IV, sodium chloride, sodium chloride flush    Assessment/Plan    1. Acute upper GI bleed with symptomatic anemia - hgb dropped to 6.7. Transfused.  - Eliquis off - CT negative for RP bleed - EGD with 2 ulcers not acutely bleeding.  - Hgb 9.3>>8.6 >> 8.5>>8.4>10   - On protonix PO 40 mg bid  - Off  AC for 2 weeks to allow GI healing. ? Can try next week  - Biopsy back for H pylori. On treatment per GI  2. PAH with cor pulmonale - CT chest 4/22: No PE or ILD - Echo LVEF 60% severe RV dilation and HK - Cath with moderate PAH. PA = 66/31 (46) PCW = 13 Fick = 4.1/2.1 PVR = 7.8 WU - Suspect this is WHO Group 3 PAH due to hypoxic lung disease/OHS/OSA - Will order PFTs (when better compensated) and will need outpatient sleep study - Serologies for completed. Note abnormal ANA, with borderline elevation in chromatin antibody, but all other serologies negative. ANCA pendingESR only mildly abnormal at 44  - On sildenafil 40 mg tid. On midodrine 10 mg three times a day.   - Volume status trending up. Given 40 mg IV lasix x1  - Continue jardiance 10 mg daily.   3. Paroxysmal AF - new  onset. Unclear duration  - Eliquis on hold d/t GI bleed - Was in NSR last week --> back in A fib over the last few days.   - No AC with recent large GI bleed - Continue SCDs.  - Continue PO amio 400 mg daily . Given he keep going back in a fib could consider toprol xl for rate control.  - Not candidate for DC-CV without AC.  - Hopefully can try to resume Eliquis next week. May  be candidate for Watchman LAA closure if we can get him stable on [facility] for 1-2 months. Would avoid long-term amio use given underlying lung disease if at all possible.    4. CAD  -mostly non-obstructive - Continue atorvastatin 40 mg daily.  - No ASA with AC - No chest pain.   5. R lung mass - High Res CT- redemonstrated spiculated mass anterior RML with trace left pleural effusion.   6. Shock - Suspect mixed hemorrhagic/cardiogenic (RV failure).   - Resolved  7. Acute hypoxemic respiratory failure with bilateral pleural effusions - Intubated pre-EGD on 4/22.   - Extubated--> on HFNC - Respiratory status remains tenuous. As noted before, degree of hypoxemia far out of proportion to HF or PAH. Continue with aggressive pulmonary toilet and mobilization. - With response to O2 supplementation shunt physiology less likely.  - Now s/p R thora with 1200 cc out. (mostly transudative)  - CXR still with airspace disease  - High Res CT- redemonstrated spiculated mass anterior RML with trace left pleural effusion.  8. Hyperkalemia K 5.2. Give dose of IV lasix now.  Not on spiro or k supplementation.   Length of Stay: [place], NP  09/21/2020, 10:13 AM  Advanced Heart Failure Team Pager [phone] (M-F; 7a - 5p)  Please contact [facility] for night-coverage after hours (5p -7a ) and weekends on [email or web address]  Patient seen and examined with the above-signed Advanced Practice Provider and/or Housestaff. I personally reviewed laboratory data, imaging studies and relevant notes. I independently examined the patient  and formulated the important aspects of the plan. I have edited the note to reflect any of my changes or salient points. I have personally discussed the plan with the patient and/or family.  Started on pulse dose steroids yesterday. Feels better but still requiring HF O2 without much change in sats. Remains in AF with controlled rate.   General:  Sitting in chair. On HFNC HEENT: normal Neck: supple. no JVD. Carotids 2+ bilat; no bruits. No lymphadenopathy or thryomegaly appreciated. Cor: PMI nondisplaced. Iregular rate & rhythm. No rubs, gallops or murmurs. Lungs: coarse Abdomen: soft, nontender, nondistended. No hepatosplenomegaly. No bruits or masses. Good bowel sounds. Extremities: no cyanosis, clubbing, rash, edema Neuro: alert & orientedx3, cranial nerves grossly intact. moves all 4 extremities w/o difficulty. Affect pleasant  Volume status looks really good. Doubt much more to be gained with further diuresis. Hi-res CT unremarkable except for re-demonstrated lung mass. Pulse dose steroids started yesterday with minimal response so far.  I d/w Dr. Carlis Abbott. Plan to watch over the weekend and if no improvement on steroids will repeat RHC on Monday. Can consider restarting Eliquis next week. Drop amio to 200 daily.   Glori Bickers, MD  11:39 AM

## 2020-09-21 NOTE — Progress Notes (Addendum)
Family Medicine Teaching Service Daily Progress Note Intern Pager: [phone]  Patient name: Gary [name] Medical record number: [id number] Date of birth: March 08, 1951 Age: 69 y.o. Gender: male  Primary Care Provider: Jalene Mullet, PA-C Consultants: Heart Failure, Cardiology, Pulmonology Code Status: FULL  Pt Overview and Major Events to Date:  4/18 admitted 4/19 right/left heart cath with concern for pulmonary HTN 4/22 2u pRBC, intubated, EGD (nonbleeding duodenal ulcers), transferred to ICU 4/26 extubated, 1u pRBC 4/28 FPTS resumed care 5/3: 1200 cc serosanguinous fluid removed via thoracentesis 5/5: HR CT with spiculated mass in RML with hilar adenopathy; started on pulse dose steroids   Assessment and Plan: Gary [name] a 69 y.o.malewhopresentedwith acute hypoxemic respiratory failure 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 PMHx significant for: HTN, T2DM, HLD, depression.  Acute Hypoxemic Respiratory Failure 2/2 PAH with cor pulmonale HRCT demonstrated spiculated mass in the right middle lobe with hilar adenopathy concerning for malignancy with metastasis. No evidence of fibrotic ILD. BNP 360 (previously 1,202.7 on 4/18). ANCA titers all <1:20.  Fungus culture report negative. On 25L at 60% FiO2.  -Cardiology following; appreciate recommendations -Try to avoid long-term Amio if possible -DC-CV not possible without anticoagulation  -Pulmonology following; appreciation recommendations  -Trial of pulse-dose steroids  -Continue PPI for GI ppx  -Re-tap if pleural effusion reaccumulates  -Repeat CXR tomorrow  -If more stable: bronch and EBUS for lung mass -Continue Sildenafil 40 mg TID -Continue LABA/LAMA/ICS -Wean O2 as tolerated to maintain sats 88-92% -Outpatient PFTs and sleep study -Pulmonary hygiene, mobilization  Hyperkalemia K+  5.2. -Holding potassium supplementation  -Will get Lasix 40 mg x1 dose   Paroxysmal Atrial Fibrillation/Atrial Flutter Rate controlled.OnPO Amiodarone.  -Cardiology following, appreciate recommendations.  -Holding AC due to recent GIB requiring transfusions; May be able to resume next week -Continue Amiodarone 400 mg daily   [name]: Stable Hgbstable8.4>10.Transfusion threshold 8 given CAD.Ontriple therapy. -Triple therapy: Amoxicillin 1g BID, Clarithromycin 500 mg BID, Protonix 40 mg BID x14 days(5/2-5/15) -Will need TOCoutpatientafter treatment  Thrombocytosis Platelets 447>502>609 this AM. Previously normal. Likely reactive. -Monitor with CBC  Severe Protein Calorie Malnutrition -Continue nutritional supplementation   CAD: Chronic, stable -Atorvastatin 40 mg daily -Holding ASA in setting of recent GIB and duodenal ulcers  Type 2 DM: Chronic, stable CBG'swell controlled, 105-215in last 24 hours. Was started on high-dose steroids yesterday which explains the increase. Home medications include Metformin, pioglitazone and Glipizide.  -IncreaseLantus15U daily -Will give additional 5U Lantus today as patient already received 10U.  -Continuemetformin 1000 mg BID -Continue10 mg Empagliflozin -HoldhomeGlipizideand pioglitazone -Monitor CBG's  Hypertension with recent Hypotension BP's ranging92-108/59-72. -Continue Midodrine 10 mg TID  -Monitor BP's  FEN/GI:Dysphagia 3 EQA:STMH   Status is: Inpatient  Remains inpatient appropriate because:Ongoing diagnostic testing needed not appropriate for outpatient work up and Inpatient level of care appropriate due to severity of illness   Dispo: The patient is from: Home              Anticipated d/c is to: CIR              Patient currently is not medically stable to d/c.   Difficult to place patient No   Subjective:  Patient shares with me some personal stories with me this  morning regarding his daughter who passed away from Turner syndrome and his father and mother who who have also passed.  He shares with me that death to him is not as  scary as it once was.  He has a strong faith.  He is hopeful that even if the results show he has cancer, he will be strong and fight it.  He is aware that there are now various treatments for this and he is willing to continue what ever testing needs to be done in order to get a final diagnosis.  He declines chaplain support at this time, though states he may be amenable to this in the future.  He denies any complaints with his breathing, states that he was able to sleep a few hours straight last night until he was awoken for blood tests.   He also shares that he believes he had an illness that lasted about 10 days in December 2019.  He is wondering if that infection could have been COVID and could be contributing to his pulmonary decline.  He has had 2 COVID vaccines and a booster since.  Objective: Temp:  [97.6 F (36.4 C)-98.5 F (36.9 C)] 98.5 F (36.9 C) (05/06 0335) Pulse Rate:  [84-117] 100 (05/06 0335) Resp:  [17-27] 20 (05/06 0335) BP: (92-108)/(59-72) 100/71 (05/06 0335) SpO2:  [88 %-94 %] 94 % (05/06 0335) FiO2 (%):  [60 %] 60 % (05/05 2043) Physical Exam: General: Awake, alert, no distress, pleasant, conversational Cardiovascular: RRR, no murmur Respiratory: Diminished breath sounds, without wheezing/rhonchi, on 25 L high flow nasal cannula at 60% FiO2, in no respiratory distress or accessory muscle use Extremities: Compression stockings in place bilaterally, no pitting edema appreciated  Laboratory: Recent Labs  Lab 09/19/20 0306 09/20/20 0047 09/21/20 0120  WBC 9.8 8.9 5.0  HGB 8.5* 8.4* 10.0*  HCT 26.7* 26.9* 31.5*  PLT 447* 502* 609*   Recent Labs  Lab 09/18/20 1310 09/19/20 0306 09/20/20 0047 09/21/20 0120  NA  --  133* 131* 130*  K  --  4.5 4.1 5.2*  CL  --  89* 90* 92*  CO2  --  35* 30 26   BUN  --  28* 25* 28*  CREATININE  --  1.29* 1.29* 1.20  CALCIUM  --  7.5* 7.7* 8.1*  PROT 5.7*  --   --   --   GLUCOSE  --  114* 108* 198*   Imaging/Diagnostic Tests: CT Chest High Resolution  Result Date: 09/20/2020 CLINICAL DATA:  Respiratory failure, concern for underlying pulmonary parenchymal disease EXAM: CT CHEST WITHOUT CONTRAST TECHNIQUE: Multidetector CT imaging of the chest was performed following the standard protocol without intravenous contrast. High resolution imaging of the lungs, as well as inspiratory and expiratory imaging, was performed. COMPARISON:  CT chest angiogram, 09/14/2020 FINDINGS: Cardiovascular: Aortic atherosclerosis. Left internal jugular vascular catheter, tip position near the superior cavoatrial junction. Normal heart size. Three-vessel coronary artery calcifications. No pericardial effusion. Mediastinum/Nodes: Redemonstrated right hilar and mediastinal lymphadenopathy, the hila not well evaluated on noncontrast examination. Thyroid gland, trachea, and esophagus demonstrate no significant findings. Lungs/Pleura: Small right, trace left pleural effusions and associated atelectasis or consolidation. Mild, diffuse interlobular septal thickening. Scattered ground-glass and irregular heterogeneous airspace opacity throughout the lungs. Redemonstrated spiculated, masslike opacity of the anterior right middle lobe with adjacent pleural retraction, measuring 3.4 x 1.7 cm (series 6, image 89). Although not significantly changed compared to recent examination performed several days prior, this appears enlarged over multiple prior examinations dating back to 06/27/2020. No significant air trapping on expiratory phase imaging. Upper Abdomen: No acute abnormality. Musculoskeletal: No chest wall mass or suspicious bone lesions identified. IMPRESSION: 1. Redemonstrated spiculated, masslike opacity  of the anterior right middle lobe with adjacent pleural retraction, measuring 3.4 x 1.7  cm. Although not significantly changed compared to recent examination performed several days prior, this appears enlarged over multiple prior examinations dating back to 06/27/2020. Redemonstrated right hilar and mediastinal lymphadenopathy, the hila not well evaluated on noncontrast examination. These findings remain highly concerning for primary lung malignancy and nodal metastatic disease. 2. Small right, trace left pleural effusions and associated atelectasis or consolidation. 3. Mild, diffuse interlobular septal thickening, suggestive of mild pulmonary edema. 4. Scattered ground-glass and irregular heterogeneous airspace opacity throughout the lungs, nonspecific and infectious or inflammatory. 5. No specific evidence of fibrotic interstitial lung disease. Examination is substantially limited by the presence of edema, effusions, and acute airspace disease. 6. Coronary artery disease. Aortic Atherosclerosis (ICD10-I70.0). Electronically Signed   By: Eddie Candle M.D.   On: 09/20/2020 12:34     Sharion Settler, DO 09/21/2020, 5:53 AM PGY-1, [place] Intern pager: [phone], text pages welcome

## 2020-09-21 NOTE — Progress Notes (Signed)
NAME:  Gary [name], MRN:  [id number], DOB:  28-Feb-1951, LOS: 87 ADMISSION DATE:  09/03/2020, CONSULTATION DATE:  4/22 REFERRING MD:  Nori Riis, CHIEF COMPLAINT:  Dyspnea   History of Present Illness:  70 y/o male admitted with dyspnea and new oxygen requirements.  Found to have pulmonary hypertension. Hospitalization complicated by GI bleeding, went to the ICU and required intubation in setting of hemorrhagic and cardiogenic shock. Treated with dobutuamine and iloprost while intubated.  Extubated after 4 days.  Since extubation pulmonary has been following for persistently elevated oxygen requirements.    Pertinent  Medical History  Former smoker HTN DM Traumatic crush injury from a tree branch with multiple fractures Epistaxis requiring packing   Significant Hospital Events: Including procedures, antibiotic start and stop dates in addition to other pertinent events    4/18 admitted with SOB, echo with elevated right heart pressures, new finding of A. Fib  4/18 CT angiogram chest > small bilateral effusions, patchy infiltrates R lung, RML opacity, no PE  4/19 R/L heart cath with concern for pulmonary hypertension; Mean PA pressure 46, PCWP 13, PVR 7.8 WU  4/20 on 15L HFNC  4/21 hypotensive episode, responded to IVF, down to 5L O2  4/22 ABLA, to ICU, intubated, severe hypoxemia; EGD with nonbleeding duodenal ulcer  4/23 failed veletri wean  4/26 veletri weaned and pt extubated  4/27 transferred Out of ICU  4/29 PCCM re-consulted for recommendations regarding ongoing hypoxia and high oxygen demand. CCM ordering CXR, CTA chest  4/29 CT angiogram > no PE, progresive bilatearl perihilar airspace disease, worse in upper lobes, nodular consolidation in RML, R>L bilateral effusions . 5/3 R thoracentesis> 1.2 L fluid removed, exudate (1/3 light's criteria; pleural fluid LDH is > 2/3 upper limit of normal serum LDH, albumin gradient < 1), lymphocytic predominant . 09/20/2020 initiation of  steroids . 5 5 high-resolution CT chest redemonstrate spiculated mass anterior right middle lobe 3.4x1.7, small trace left pleural effusion  Interim History / Subjective:  Reports feeling better  Objective   Blood pressure 100/71, pulse 100, temperature 98.5 F (36.9 C), temperature source Oral, resp. rate 20, height 5' 7"  (1.702 m), weight 83 kg, SpO2 90 %.    FiO2 (%):  [60 %] 60 %   Intake/Output Summary (Last 24 hours) at 09/21/2020 0907 Last data filed at 09/21/2020 8101 Gross per 24 hour  Intake 634 ml  Output 2750 ml  Net -2116 ml   Filed Weights   09/19/20 0407 09/20/20 0300 09/21/20 7510  Weight: 85 kg 83 kg 83 kg    Examination:  General: Well-nourished well-developed male no acute distress reports feeling better after starting oral steroid HEENT: No JVD or lymphadenopathy is appreciated Neuro: Fully intact without focal defect CV: Decreased breath sounds in bases PULM: Managed in the bases mild rhonchi GI: soft, bsx4 active  Extremities: warm/dry, 1+ edema  Skin: no rashes or lesions   Labs/imaging that I havepersonally reviewed  (right click and "Reselect all SmartList Selections" daily)  See below. CT was reviewed. Looks fairly similar to prior CT imaging. Will defer to Dr Carlis Abbott   [facility] Problem list     Assessment & Plan:   Acute respiratory failure with hypoxemia in setting of pulmonary hypertension and bilateral pulmonary infiltrates with R>L pleural effusion R>L exudative pleural effusions  WHO group 3 pulm hypertension Atrial fibrillation Barret's esophagitis Gastritis, h.pylori positive Former smoker CAD  Pulm dx Acute respiratory failure with hypoxemia in setting of pulmonary hypertension and bilateral pulmonary  infiltrates with R>L pleural effusion R>L exudative pleural effusions  WHO group 3 pulm hypertension  Discussion Unclear what is going on in his pulmonary parenchyma, inflammatory infiltrate vs malignancy, some component  of transfusion or drug related lung disease? Exudative right Effusion (by light's criteria and albumin gradient) cytology was negative. CT chest w/ fairly similar appearance as it had on 4/29 w/ patchy GG changes and sm-mod right effusion Autoimmune neg work up and so far the anca ab neg and histone AB neg.   Plan/rec Started on Solu-Medrol 09/20/2020, patient reports feeling better and hungry. Note FiO2 is currently on 60% with sats of 89% Diuresis per cardiology, note 1200 cc negative x24 hours Wean FiO2 as tolerated Continue Revatio Questionable invasive work-up in future but he would be high risk at best.       Best practice per primary  [place] Nurse Practitioner [place] Please consult Amion 09/21/2020, 9:07 AM

## 2020-09-21 NOTE — Progress Notes (Signed)
Physical Therapy Treatment Patient Details Name: Gary [name] MRN: [id number] DOB: 26-Sep-1950 Today's Date: 09/21/2020    History of Present Illness Patient is a 70 y/o male who presents on 09/03/20 with progressive SOB. Found to have acute respiratory failure likely due to new onset of heart failure and A-fib with RVR. CXR- Rt effusion, prominent interstitial edema/disease. s/p cardiac cath 09/04/20. 4/21 hypotension, 4/22 drop in Hgb, GIB with intubation and urgent bedside EGD on 4/22 with PT sign off.  Pt was extubated on 4/26. PT reordered on 4/27. PMhx: HTN and DM.    PT Comments    Pt received in chair, agreeable to therapy session and with good participation and tolerance for seated/standing exercises, limited mainly due to increased work of breathing with standing and SpO2 desat with dynamic standing tasks. Pt SpO2 stable for seated exercises on 25L HF O2 [place] and FiO2 60%, brief desat with standing exercise to 81% (and RR max 39 rpm) but O2 and RR recovered to >88% and 20's rpm within 2 minutes of seated rest break and cues for deep pursed-lip breathing. Pt needing up to min guard for safety with transfers and dynamic standing tasks at RW. HR max 115 bpm with exertion. Encouraged use of IS 10x/hr with cues needed for proper technique. BP hypotensive in seated posture but improves with standing posture. Pt continues to benefit from PT services to progress toward functional mobility goals. Continue to recommend CIR.  Follow Up Recommendations  CIR     Equipment Recommendations  Rolling walker with 5" wheels;3in1 (PT)    Recommendations for Other Services       Precautions / Restrictions Precautions Precautions: Fall Precaution Comments: watch 02 (on high flow), HR Restrictions Weight Bearing Restrictions: No    Mobility  Bed Mobility    General bed mobility comments: Pt up in chair on arrival and end of session    Transfers Overall transfer level: Needs assistance Equipment  used: Rolling walker (2 wheeled) Transfers: Sit to/from Stand Sit to Stand: Min guard         General transfer comment: min guard for safety to rise from chair x2  Ambulation/Gait Ambulation/Gait assistance: Min guard Gait Distance (Feet): 6 Feet Assistive device: Rolling walker (2 wheeled) Gait Pattern/deviations: Step-through pattern;Decreased step length - right;Decreased step length - left;Antalgic;Trunk flexed     General Gait Details: forward/backward steps within RW in front of chair during standing exercises/BP assessment     Balance Overall balance assessment: Needs assistance Sitting-balance support: No upper extremity supported;Feet supported Sitting balance-Leahy Scale: Fair Sitting balance - Comments: sitting on edge of chair unsupported with no UE support and no LOB   Standing balance support: Bilateral upper extremity supported;During functional activity Standing balance-Leahy Scale: Poor Standing balance comment: walker and min guard for static standing     Cognition Arousal/Alertness: Awake/alert Behavior During Therapy: WFL for tasks assessed/performed Overall Cognitive Status: Within Functional Limits for tasks assessed        General Comments: pleasant, participatory      Exercises General Exercises - Lower Extremity Ankle Circles/Pumps: Both;Seated;Strengthening;15 reps Long Arc Quad: Both;Seated;Strengthening;AROM;10 reps Hip Flexion/Marching: Both;10 reps;Seated;Strengthening;AROM Other Exercises Other Exercises: pulling 500-800 on IS consistently x10 reps, instruction on technique Other Exercises: chair push-ups with BUE and BLE support x5 reps Other Exercises: standing AROM BLE: hamstring curls x5 reps ea Other Exercises: forward/backward steps and standing tolerance for BLE strengthening (gait billed as TE for strengthening)    General Comments General comments (skin integrity, edema, etc.): BP  93/61 (73)seated 10 mins prior to session, BP  90/49 (59) seated after seated exercise; BP 91/64 (73) standing at RW; HR max 115 bpm with exertion      Pertinent Vitals/Pain Pain Assessment: No/denies pain Faces Pain Scale: Hurts a little bit Pain Location: rt knee Pain Descriptors / Indicators: Aching;Sore Pain Intervention(s): Monitored during session;Repositioned;Limited activity within patient's tolerance           PT Goals (current goals can now be found in the care plan section) Acute Rehab PT Goals Patient Stated Goal: to get to CIR and get stronger before going home PT Goal Formulation: With patient Time For Goal Achievement: 09/26/20 Potential to Achieve Goals: Good Progress towards PT goals: Progressing toward goals    Frequency    Min 3X/week      PT Plan Current plan remains appropriate       AM-PAC PT "6 Clicks" Mobility   Outcome Measure  Help needed turning from your back to your side while in a flat bed without using bedrails?: A Little Help needed moving from lying on your back to sitting on the side of a flat bed without using bedrails?: A Little Help needed moving to and from a bed to a chair (including a wheelchair)?: A Little Help needed standing up from a chair using your arms (e.g., wheelchair or bedside chair)?: A Little Help needed to walk in hospital room?: A Little Help needed climbing 3-5 steps with a railing? : A Lot 6 Click Score: 17    End of Session Equipment Utilized During Treatment: Oxygen Activity Tolerance: Patient tolerated treatment well Patient left: in chair;with call bell/phone within reach;with family/visitor present Nurse Communication: Mobility status;Other (comment) (pt brief desat with standing tasks but improves quickly with rest break, remained on 25L HF [place] 60% FiO2) PT Visit Diagnosis: Muscle weakness (generalized) (M62.81);Difficulty in walking, not elsewhere classified (R26.2);Other (comment)     Time: 6269-4854 PT Time Calculation (min) (ACUTE ONLY): 19  min  Charges:  $Therapeutic Exercise: 8-22 mins                     Lexus Shampine P., PTA Acute Rehabilitation Services Pager: [phone] Office: [place] 09/21/2020, 11:59 AM

## 2020-09-21 NOTE — Progress Notes (Signed)
Inpatient Rehab Admissions Coordinator:    Note pt. On 25L of O2 via HFNC. Not medically ready. Discussed with wife that he is progressing well with mobility so may ultimately not need CIR if he continues to progress. I will continue to follow for potential admit if Pt. Becomes medically appropriately .

## 2020-09-22 ENCOUNTER — Inpatient Hospital Stay (HOSPITAL_COMMUNITY): Payer: Medicare Other

## 2020-09-22 DIAGNOSIS — I5021 Acute systolic (congestive) heart failure: Secondary | ICD-10-CM

## 2020-09-22 DIAGNOSIS — I509 Heart failure, unspecified: Secondary | ICD-10-CM | POA: Diagnosis not present

## 2020-09-22 DIAGNOSIS — I50813 Acute on chronic right heart failure: Secondary | ICD-10-CM | POA: Diagnosis not present

## 2020-09-22 DIAGNOSIS — J80 Acute respiratory distress syndrome: Secondary | ICD-10-CM | POA: Diagnosis not present

## 2020-09-22 DIAGNOSIS — J90 Pleural effusion, not elsewhere classified: Secondary | ICD-10-CM | POA: Diagnosis not present

## 2020-09-22 DIAGNOSIS — I484 Atypical atrial flutter: Secondary | ICD-10-CM | POA: Diagnosis not present

## 2020-09-22 DIAGNOSIS — R918 Other nonspecific abnormal finding of lung field: Secondary | ICD-10-CM | POA: Diagnosis not present

## 2020-09-22 DIAGNOSIS — J9601 Acute respiratory failure with hypoxia: Secondary | ICD-10-CM | POA: Diagnosis not present

## 2020-09-22 LAB — BODY FLUID CULTURE W GRAM STAIN: Culture: NO GROWTH

## 2020-09-22 LAB — CBC
HCT: 29 % — ABNORMAL LOW (ref 39.0–52.0)
Hemoglobin: 9.3 g/dL — ABNORMAL LOW (ref 13.0–17.0)
MCH: 29.4 pg (ref 26.0–34.0)
MCHC: 32.1 g/dL (ref 30.0–36.0)
MCV: 91.8 fL (ref 80.0–100.0)
Platelets: 603 10*3/uL — ABNORMAL HIGH (ref 150–400)
RBC: 3.16 MIL/uL — ABNORMAL LOW (ref 4.22–5.81)
RDW: 15.1 % (ref 11.5–15.5)
WBC: 10.6 10*3/uL — ABNORMAL HIGH (ref 4.0–10.5)
nRBC: 0 % (ref 0.0–0.2)

## 2020-09-22 LAB — BASIC METABOLIC PANEL
Anion gap: 18 — ABNORMAL HIGH (ref 5–15)
BUN: 48 mg/dL — ABNORMAL HIGH (ref 8–23)
CO2: 22 mmol/L (ref 22–32)
Calcium: 8 mg/dL — ABNORMAL LOW (ref 8.9–10.3)
Chloride: 91 mmol/L — ABNORMAL LOW (ref 98–111)
Creatinine, Ser: 1.53 mg/dL — ABNORMAL HIGH (ref 0.61–1.24)
GFR, Estimated: 48 mL/min — ABNORMAL LOW (ref >60)
Glucose, Bld: 167 mg/dL — ABNORMAL HIGH (ref 70–99)
Potassium: 4.4 mmol/L (ref 3.5–5.1)
Sodium: 131 mmol/L — ABNORMAL LOW (ref 135–145)

## 2020-09-22 LAB — GLUCOSE, CAPILLARY
Glucose-Capillary: 178 mg/dL — ABNORMAL HIGH (ref 70–99)
Glucose-Capillary: 291 mg/dL — ABNORMAL HIGH (ref 70–99)
Glucose-Capillary: 166 mg/dL — ABNORMAL HIGH (ref 70–99)
Glucose-Capillary: 123 mg/dL — ABNORMAL HIGH (ref 70–99)

## 2020-09-22 NOTE — Progress Notes (Signed)
NAME:  Gary [name], MRN:  [id number], DOB:  07-Feb-1951, LOS: 35 ADMISSION DATE:  09/03/2020, CONSULTATION DATE:  4/22 REFERRING MD:  Nori Riis, CHIEF COMPLAINT:  Dyspnea   History of Present Illness:  70 y/o male admitted with dyspnea and new oxygen requirements.  Found to have pulmonary hypertension. Hospitalization complicated by GI bleeding, went to the ICU and required intubation in setting of hemorrhagic and cardiogenic shock. Treated with dobutuamine and iloprost while intubated.  Extubated after 4 days.  Since extubation pulmonary has been following for persistently elevated oxygen requirements.    Pertinent  Medical History  Former smoker HTN DM Traumatic crush injury from a tree branch with multiple fractures Epistaxis requiring packing   Significant Hospital Events: Including procedures, antibiotic start and stop dates in addition to other pertinent events    4/18 admitted with SOB, echo with elevated right heart pressures, new finding of A. Fib  4/18 CT angiogram chest > small bilateral effusions, patchy infiltrates R lung, RML opacity, no PE  4/19 R/L heart cath with concern for pulmonary hypertension; Mean PA pressure 46, PCWP 13, PVR 7.8 WU  4/20 on 15L HFNC  4/21 hypotensive episode, responded to IVF, down to 5L O2  4/22 ABLA, to ICU, intubated, severe hypoxemia; EGD with nonbleeding duodenal ulcer  4/23 failed veletri wean  4/26 veletri weaned and pt extubated  4/27 transferred Out of ICU  4/29 PCCM re-consulted for recommendations regarding ongoing hypoxia and high oxygen demand. CCM ordering CXR, CTA chest  4/29 CT angiogram > no PE, progresive bilatearl perihilar airspace disease, worse in upper lobes, nodular consolidation in RML, R>L bilateral effusions . 5/3 R thoracentesis> 1.2 L fluid removed, exudate (1/3 light's criteria; pleural fluid LDH is > 2/3 upper limit of normal serum LDH, albumin gradient < 1), lymphocytic predominant . 09/20/2020 initiation of  steroids . 5/5 high-resolution CT chest redemonstrate spiculated mass anterior right middle lobe 3.4x1.7, small trace left pleural effusion  Interim History / Subjective:  Feels well. Down to 15L, 40% today.  Objective   Blood pressure 100/64, pulse 97, temperature 98 F (36.7 C), temperature source Oral, resp. rate 15, height 5' 7"  (1.702 m), weight 84.1 kg, SpO2 90 %.    FiO2 (%):  [40 %-70 %] 40 %   Intake/Output Summary (Last 24 hours) at 09/22/2020 1528 Last data filed at 09/22/2020 0500 Gross per 24 hour  Intake --  Output 1400 ml  Net -1400 ml   Filed Weights   09/20/20 0300 09/21/20 0621 09/22/20 0339  Weight: 83 kg 83 kg 84.1 kg    Examination:  General: elderly man sitting up in the chair in NAD, watching TV HEENT: [place]/AT, eyes anicteric Neuro: awake, alert, answering questions appropriately, moving all extremities CV: decreased R basilar breath sounds, CTAB PULM: breathing comfortably on HHFNC. Mild tahcypnea. CTAB. GI: soft, NT Extremities: ongoing mild pedal edema Skin: no rashes or ecchymoses   Labs/imaging that I havepersonally reviewed  (right click and "Reselect all SmartList Selections" daily)   CXR 5/7> persistent RML opacity, no significant reaccumulation of pleural effusion on the right. Relatively unchanged from previous. WBC 10.6 H/H 9.3/29   Resolved Hospital Problem list     Assessment & Plan:   Acute respiratory failure with hypoxemia in setting of pulmonary hypertension and bilateral pulmonary infiltrates with R>L pleural effusion R persistent pleural effusion, left effusion resolved. 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 Seems to be responding to steroids over the past day.   Exudative right Effusion (by light's criteria and albumin gradient) cytology was negative. CT chest w/ fairly similar appearance as it had on 4/29 w/ patchy GG changes and sm-mod right effusion. Spiculated RML mass, R hilar adenopathy. Autoimmune neg work up and so far the anca ab neg and histone AB neg.   Plan/rec -Con't solumedrol pulse dose steroids. Reassess on 5/9. -Continue weaning supplemental oxygen with goal saturations greater than 90% -Inncentive spirometer, flutter valve - Continue diuresis per cardiology; likely is near euvolemia -Started on Solu-Medrol 09/20/2020, patient reports feeling better and hungry. -Note FiO2 is currently on 60% with sats of 89% -Diuresis per cardiology, note 1200 cc negative x24 hours -Con't to wean FiO2 as tolerated -Continue Revatio -Questionable invasive work-up for lung mass in future but he would be high risk at best. Very concerned this is primary lung cancer, which we have previously discussed with he and his wife.   Julian Hy, DO 09/22/20 4:47 PM  Pulmonary & Critical Care

## 2020-09-22 NOTE — Progress Notes (Signed)
V60 on standby in the room patient on heated high flow nasal cannula 20L/ 50% SATS 97%, will continue to wean O2 as tolerated.

## 2020-09-22 NOTE — Progress Notes (Signed)
Family Medicine Teaching Service Daily Progress Note Intern Pager: [phone]  Patient name: Gary [name] Medical record number: [id number] Date of birth: Nov 20, 1950 Age: 70 y.o. Gender: male  Primary Care Provider: Jalene Mullet, PA-C Consultants: Heart Failure, Cardiology, Pulmonology Code Status: FULL  Pt Overview and Major Events to Date:  4/18 admitted 4/19 right/left heart cath with concern for pulmonary HTN 4/22 2u pRBC, intubated, EGD (nonbleeding duodenal ulcers), transferred to ICU 4/26 extubated, 1u pRBC 4/28 FPTS resumed care 5/3: 1200 cc serosanguinous fluid removed via thoracentesis 5/5: HR CT with spiculated mass in RML with hilar adenopathy; started on pulse dose steroids   Assessment and Plan: Gary [name] a 69 y.o.malewhopresentedwith acute hypoxemic respiratory failure 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 PMHx significant for: HTN, T2DM, HLD, depression.  Acute Hypoxemic Respiratory Failure 2/2 PAH with cor pulmonale HRCT demonstrated spiculated mass in the right middle lobe with hilar adenopathy concerning for malignancy with metastasis. No evidence of fibrotic ILD. BNP 360 (previously 1,202.7 on 4/18). ANCA titers all <1:20.  Fungus culture report negative. On 20 L at 50% FiO2.  -Cardiology following; appreciate recommendations -Try to avoid long-term Amio if possible -DC-CV not possible without anticoagulation  -Pulmonology following; appreciation recommendations  -Continuing trial of stress dose steroids today  -Continue PPI for GI ppx  -Re-tap if pleural effusion reaccumulates  -Planning for repeat chest x-ray today, it has been completed but read is pending at this time  -If more stable: bronch and EBUS for lung mass -Continue Sildenafil 40 mg TID -Continue LABA/LAMA/ICS -Wean O2 as tolerated to maintain sats  88-92% -Outpatient PFTs and sleep study -Pulmonary hygiene, mobilization  Hyperkalemia K+  4.4 this morning.  Patient received 1 dose of Lasix 40 mg yesterday. -Holding potassium supplementation  -Will not give further dose of Lasix given euvolemic at this time  AKI Creatinine increased to 1.53 today from 1.2 yesterday.  Most likely due to dose of Lasix given.  We will avoid nephrotoxic agents and continue to monitor.  Paroxysmal Atrial Fibrillation/Atrial Flutter Rate controlled.OnPO Amiodarone.  -Cardiology following, appreciate recommendations.  -Holding AC due to recent GIB requiring transfusions; May be able to resume next week -Continue Amiodarone 400 mg daily   [name]: Stable Hgbstable8.4>10>9.3.Transfusion threshold 8 given CAD.Ontriple therapy. -Triple therapy: Amoxicillin 1g BID, Clarithromycin 500 mg BID, Protonix 40 mg BID x14 days(5/2-5/15) -Will need TOCoutpatientafter treatment  Thrombocytosis Platelets 447>502>609>603 this AM. Previously normal. Likely reactive. -Monitor with CBC  Severe Protein Calorie Malnutrition -Continue nutritional supplementation   CAD: Chronic, stable -Atorvastatin 40 mg daily -Holding ASA in setting of recent GIB and duodenal ulcers  Type 2 DM: Chronic, stable CBG'swell controlled, 178- 331 in last 24 hours. Was started on high-dose steroids 2 days ago which explains the increase. Home medications include Metformin, pioglitazone and Glipizide.  -Continue Lantus at 15 units daily, if continues to be high will add additional Lantus tomorrow -Continuemetformin 1000 mg BID -Continue10 mg Empagliflozin -HoldhomeGlipizideand pioglitazone -Monitor CBG's  Hypertension with recent Hypotension BP's ranging85/61-101/80 -Continue Midodrine 10 mg TID  -Monitor BP's  FEN/GI:Dysphagia 3 JYN:WGNF   Status is: Inpatient  Remains inpatient appropriate because:Ongoing  diagnostic testing needed not appropriate for outpatient work up and Inpatient level of care appropriate due to severity of illness   Dispo: The patient is from: Home              Anticipated d/c is to: CIR  Patient currently is not medically stable to d/c.   Difficult to place patient No   Subjective:  Patient is sitting up in chair this morning appears comfortable.  Reports that he is coping well and praying regarding his prognosis.  We had a long discussion regarding continued monitoring of his symptoms and possible repeat of the thoracentesis if fluid reaccumulate's.  Patient has no questions at this time.  Objective: Temp:  [97.4 F (36.3 C)-98 F (36.7 C)] 97.4 F (36.3 C) (05/07 0739) Pulse Rate:  [92-116] 100 (05/07 0739) Resp:  [18-26] 19 (05/07 0739) BP: (85-101)/(59-80) 96/62 (05/07 0739) SpO2:  [89 %-99 %] 99 % (05/07 0806) FiO2 (%):  [50 %-70 %] 50 % (05/07 0806) Weight:  [84.1 kg] 84.1 kg (05/07 0339) Physical Exam: General: Laying comfortably in chair next to hospital bed, no acute distress, pleasant, conversant we will Cardiovascular: Tachycardic, regular rhythm, no murmur Respiratory: Diminished breath sounds, without wheezing/rhonchi, on 20 L high flow nasal cannula at 50 % FiO2, in no respiratory distress or accessory muscle use Extremities: Compression stockings in place bilaterally, no pitting edema appreciated  Laboratory: Recent Labs  Lab 09/20/20 0047 09/21/20 0120 09/22/20 0025  WBC 8.9 5.0 10.6*  HGB 8.4* 10.0* 9.3*  HCT 26.9* 31.5* 29.0*  PLT 502* 609* 603*   Recent Labs  Lab 09/18/20 1310 09/19/20 0306 09/20/20 0047 09/21/20 0120 09/22/20 0025  NA  --    < > 131* 130* 131*  K  --    < > 4.1 5.2* 4.4  CL  --    < > 90* 92* 91*  CO2  --    < > 30 26 22   BUN  --    < > 25* 28* 48*  CREATININE  --    < > 1.29* 1.20 1.53*  CALCIUM  --    < > 7.7* 8.1* 8.0*  PROT 5.7*  --   --   --   --   GLUCOSE  --    < > 108* 198* 167*   < >  = values in this interval not displayed.   Imaging/Diagnostic Tests: DG CHEST PORT 1 VIEW  Result Date: 09/22/2020 CLINICAL DATA:  Reason for exam: Right side pleural effusion Hx of diabetes, HTN EXAM: PORTABLE CHEST 1 VIEW COMPARISON:  09/18/2020.  CT, 09/20/2020. FINDINGS: Cardiac silhouette borderline enlarged. No mediastinal or hilar masses. Patchy opacity noted in the right lung base extending from the inferior right hilum. There is mild interstitial thickening and vascular prominence most evident in the bases. Small right pleural effusion. No pneumothorax. IMPRESSION: 1. Findings similar to the recent chest CT with patchy opacity at the right lung base. This is partly due to the 3.4 cm spiculated mass noted on the prior CT, not defined radiographically, as well as additional airspace and interstitial opacities. Small right pleural effusion. These findings suggest mild congestive heart failure/pulmonary edema. Electronically Signed   By: Lajean Manes M.D.   On: 09/22/2020 08:28     Gifford Shave, MD 09/22/2020, 11:20 AM PGY-2, [place] Intern pager: [phone], text pages welcome

## 2020-09-22 NOTE — Progress Notes (Signed)
Progress Note  Patient Name: Gary [name] Date of Encounter: 09/22/2020  [facility] Cardiologist: Elouise Munroe, MD   Subjective   Making steady improvement in braething. Tolerated reduction in O2 flow without dyspnea or desaturation, but he has noticed that his respiratory rate is faster. Remains in AFlutter. Had ventricular rates mostly in 90s before O2 flow was reduced, now mostly 2:1 Av block and rates in 120s at rest. On PO amio.  Inpatient Medications    Scheduled Meds: . sodium chloride   Intravenous Once  . amiodarone  200 mg Oral Daily  . amoxicillin  1,000 mg Oral Q12H  . arformoterol  15 mcg Nebulization BID  . atorvastatin  40 mg Oral Daily  . budesonide  0.5 mg Nebulization BID  . chlorhexidine  15 mL Mouth Rinse BID  . clarithromycin  500 mg Oral Q12H  . empagliflozin  10 mg Oral Daily  . feeding supplement (GLUCERNA SHAKE)  237 mL Oral TID BM  . ferrous sulfate  325 mg Oral Q breakfast  . insulin aspart  0-15 Units Subcutaneous TID WC  . insulin aspart  0-5 Units Subcutaneous QHS  . insulin aspart  6 Units Subcutaneous TID WC  . insulin glargine  15 Units Subcutaneous Daily  . mouth rinse  15 mL Mouth Rinse BID  . mouth rinse  15 mL Mouth Rinse q12n4p  . metFORMIN  1,000 mg Oral BID WC  . midodrine  10 mg Oral TID WC  . multivitamin with minerals  1 tablet Oral Daily  . pantoprazole  40 mg Oral BID  . polyethylene glycol  17 g Oral Daily  . revefenacin  175 mcg Nebulization Daily  . saline  1 application Each Nare BID  . sildenafil  40 mg Oral TID  . sodium chloride flush  10-40 mL Intracatheter Q12H   Continuous Infusions: . sodium chloride Stopped (09/10/20 1336)  . sodium chloride 10 mL/hr at 09/13/20 0900  . methylPREDNISolone (SOLU-MEDROL) injection 250 mg (09/22/20 1229)   PRN Meds: sodium chloride, acetaminophen, guaiFENesin-dextromethorphan, ondansetron (ZOFRAN) IV, sodium chloride, sodium chloride flush   Vital Signs    Vitals:    09/22/20 0801 09/22/20 0806 09/22/20 1122 09/22/20 1149  BP:   100/64   Pulse:   97   Resp:   15   Temp:   98 F (36.7 C)   TempSrc:   Oral   SpO2: 93% 99% 95% 90%  Weight:      Height:        Intake/Output Summary (Last 24 hours) at 09/22/2020 1232 Last data filed at 09/22/2020 0500 Gross per 24 hour  Intake --  Output 1400 ml  Net -1400 ml   Last 3 Weights 09/22/2020 09/21/2020 09/20/2020  Weight (lbs) 185 lb 6.5 oz 182 lb 15.7 oz 182 lb 15.7 oz  Weight (kg) 84.1 kg 83 kg 83 kg      Telemetry    AFlutter with VR 120 - Personally Reviewed  ECG    AFlutter w RVR 104, RBBB+LAFB, QTc 554 (QRS 164) - Personally Reviewed  Physical Exam  Comfortable, voice stronger GEN: No acute distress.   Neck: 8 cm JVD Cardiac: irregular and fast, no murmurs, rubs, or gallops.  Respiratory: Clear to auscultation bilaterally, but diminished throughout. GI: Soft, nontender, non-distended  MS: No edema; No deformity. Neuro:  Nonfocal  Psych: Normal affect   Labs    High Sensitivity Troponin:   Recent Labs  Lab 09/03/20 1040 09/03/20 1433 09/03/20  1803  TROPONINIHS 98* 101* 98*      Chemistry Recent Labs  Lab 09/18/20 1310 09/19/20 0306 09/20/20 0047 09/21/20 0120 09/22/20 0025  NA  --    < > 131* 130* 131*  K  --    < > 4.1 5.2* 4.4  CL  --    < > 90* 92* 91*  CO2  --    < > 30 26 22   GLUCOSE  --    < > 108* 198* 167*  BUN  --    < > 25* 28* 48*  CREATININE  --    < > 1.29* 1.20 1.53*  CALCIUM  --    < > 7.7* 8.1* 8.0*  PROT 5.7*  --   --   --   --   ALBUMIN 1.9*  --   --   --   --   GFRNONAA  --    < > 59* >60 48*  ANIONGAP  --    < > 11 12 18*   < > = values in this interval not displayed.     Hematology Recent Labs  Lab 09/20/20 0047 09/21/20 0120 09/22/20 0025  WBC 8.9 5.0 10.6*  RBC 2.93* 3.50* 3.16*  HGB 8.4* 10.0* 9.3*  HCT 26.9* 31.5* 29.0*  MCV 91.8 90.0 91.8  MCH 28.7 28.6 29.4  MCHC 31.2 31.7 32.1  RDW 14.9 15.0 15.1  PLT 502* 609* 603*     BNP Recent Labs  Lab 09/20/20 0047  BNP 360.6*     DDimer No results for input(s): DDIMER in the last 168 hours.   Radiology    DG CHEST PORT 1 VIEW  Result Date: 09/22/2020 CLINICAL DATA:  Reason for exam: Right side pleural effusion Hx of diabetes, HTN EXAM: PORTABLE CHEST 1 VIEW COMPARISON:  09/18/2020.  CT, 09/20/2020. FINDINGS: Cardiac silhouette borderline enlarged. No mediastinal or hilar masses. Patchy opacity noted in the right lung base extending from the inferior right hilum. There is mild interstitial thickening and vascular prominence most evident in the bases. Small right pleural effusion. No pneumothorax. IMPRESSION: 1. Findings similar to the recent chest CT with patchy opacity at the right lung base. This is partly due to the 3.4 cm spiculated mass noted on the prior CT, not defined radiographically, as well as additional airspace and interstitial opacities. Small right pleural effusion. These findings suggest mild congestive heart failure/pulmonary edema. Electronically Signed   By: Lajean Manes M.D.   On: 09/22/2020 08:28    Cardiac Studies   Cardiac catheterization 09/05/2018.    Dist RCA lesion is 100% stenosed.  Ost LAD to Mid LAD lesion is 20% stenosed.  Mid LAD lesion is 40% stenosed.  Prox RCA lesion is 40% stenosed.  Findings:  Ao = 95/60 (76) LV = 95/14  RA = 12 RV = 60/13 PA = 66/31 (46) PCW = 13 Fick cardiac output/index = 4.1/2.1 PVR = 7.8 WU Ao sat = 99% PA sat =63%, 65%  Assessment: 1. 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 There is a large RV marginal branch that is widely patent 2. EF 60-65% 3. Moderate PAH with elevated PVR and moderately reduced cardiac output  Echocardiogram 09/03/2020  1. There is severe RV enlargement with severely depressed RV systolic function and moderately elevated PASP. The interventricular septum is flattened in systole consistent with RV  pressure overload. Constellation of findings highly concerning for pulmonary embolism. Cardiology team aware and CTA pending.  2. Left ventricular ejection fraction, by estimation, is 55 to 60%. The left ventricle has normal function. Wall motion difficult to assess due to poor visualization of the LV endocardium but appears grossly normal. There is mild concentric left ventricular hypertrophy. Diastolic function is indeterminant due to atrial fibrillation. There is the interventricular septum is flattened in systole, consistent with right ventricular pressure overload.  3. Right ventricular systolic function is severely reduced. The right ventricular size is severely enlarged. There is moderately elevatedpulmonary artery systolic pressure. The estimated right ventricular  systolic pressure is 16.1 mmHg.  4. Right atrial size was moderately dilated.  5. The mitral valve is grossly normal. Mild mitral valve regurgitation.  6. Tricuspid valve regurgitation is moderate.  7. The aortic valve is tricuspid. There is moderate calcification of the aortic valve. There is moderate thickening of the aortic valve. Aortic valve regurgitation is trivial. Mild to moderate aortic valve  sclerosis/calcification is present, without any evidence of aortic stenosis.  8. The inferior vena cava is dilated in size with <50% respiratory variability, suggesting right atrial pressure of 15 mmHg.   Comparison(s): No prior Echocardiogram.   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 Successfully  extubated 04 26, still requiring high flow oxygen, steadily weaning down.  Assessment & Plan    1. Acute resp failure: due to intrinsic lung disease. Any component of CHF appears to be resolved (appears euvolemic clinically and is below weight when RHC showed PAWP normal). Background of OSA and interstitial lung disease. Received steroids. Hi-res CT: "Scattered ground-glass and irregular heterogeneous airspace opacity throughout the lungs, nonspecific and infectious or Inflammatory". 2. PAH: mostly WHO group 3. 3. CHF: euvolemic to slightly dry. Will continue to have JVD due to cor pulmonale.  Renal parameters worsened after diuretic dose yesterday. Suspect dry weight is 185-190. Note plan for possible repeat cath on Monday. 4. AFlutter: secondary to cor pulmonale. Unable to cardiovert until we can restart anticoagulation. Challenging rate control due to low BP (on midodrine). On PO amio, consider adding digoxin, but risk of toxicity is substantial. 5. CAD: single vessel disease, asymptomatic. 6. GI bleed: would be able to start anticoagulation later this week. If this is successful, without recurrent bleeding, can consider TEE guided DCCV. 7. 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      For questions or updates, please contact [place] Please consult [email or web address] for contact info under        Signed, Sanda Klein, MD  09/22/2020, 12:32 PM

## 2020-09-22 NOTE — Plan of Care (Signed)
  Problem: Education: Goal: Knowledge of General Education information will improve Description: Including pain rating scale, medication(s)/side effects and non-pharmacologic comfort measures Outcome: Progressing   Problem: Health Behavior/Discharge Planning: Goal: Ability to manage health-related needs will improve Outcome: Progressing   Problem: Clinical Measurements: Goal: Ability to maintain clinical measurements within normal limits will improve Outcome: Progressing Goal: Will remain free from infection Outcome: Progressing Goal: Diagnostic test results will improve Outcome: Progressing Goal: Respiratory complications will improve Outcome: Progressing Goal: Cardiovascular complication will be avoided Outcome: Progressing   Problem: Activity: Goal: Risk for activity intolerance will decrease Outcome: Progressing   Problem: Nutrition: Goal: Adequate nutrition will be maintained Outcome: Progressing   Problem: Coping: Goal: Level of anxiety will decrease Outcome: Progressing   Problem: Elimination: Goal: Will not experience complications related to bowel motility Outcome: Progressing Goal: Will not experience complications related to urinary retention Outcome: Progressing   Problem: Pain Managment: Goal: General experience of comfort will improve Outcome: Progressing   Problem: Safety: Goal: Ability to remain free from injury will improve Outcome: Progressing   Problem: Skin Integrity: Goal: Risk for impaired skin integrity will decrease Outcome: Progressing   Problem: Education: Goal: Ability to demonstrate management of disease process will improve Outcome: Progressing Goal: Ability to verbalize understanding of medication therapies will improve Outcome: Progressing Goal: Individualized Educational Video(s) Outcome: Progressing   Problem: Activity: Goal: Capacity to carry out activities will improve Outcome: Progressing   Problem: Cardiac: Goal:  Ability to achieve and maintain adequate cardiopulmonary perfusion will improve Outcome: Progressing   Problem: Activity: Goal: Ability to tolerate increased activity will improve Outcome: Progressing   Problem: Respiratory: Goal: Ability to maintain a clear airway and adequate ventilation will improve Outcome: Progressing   Problem: Role Relationship: Goal: Method of communication will improve Outcome: Progressing

## 2020-09-22 NOTE — Progress Notes (Signed)
Patient refused use of CPAP for the evening 

## 2020-09-23 DIAGNOSIS — J9601 Acute respiratory failure with hypoxia: Secondary | ICD-10-CM | POA: Diagnosis not present

## 2020-09-23 DIAGNOSIS — I509 Heart failure, unspecified: Secondary | ICD-10-CM | POA: Diagnosis not present

## 2020-09-23 DIAGNOSIS — J80 Acute respiratory distress syndrome: Secondary | ICD-10-CM | POA: Diagnosis not present

## 2020-09-23 DIAGNOSIS — I50813 Acute on chronic right heart failure: Secondary | ICD-10-CM | POA: Diagnosis not present

## 2020-09-23 DIAGNOSIS — I484 Atypical atrial flutter: Secondary | ICD-10-CM | POA: Diagnosis not present

## 2020-09-23 DIAGNOSIS — I5021 Acute systolic (congestive) heart failure: Secondary | ICD-10-CM | POA: Diagnosis not present

## 2020-09-23 LAB — BASIC METABOLIC PANEL
Anion gap: 13 (ref 5–15)
BUN: 49 mg/dL — ABNORMAL HIGH (ref 8–23)
CO2: 25 mmol/L (ref 22–32)
Calcium: 7.8 mg/dL — ABNORMAL LOW (ref 8.9–10.3)
Chloride: 93 mmol/L — ABNORMAL LOW (ref 98–111)
Creatinine, Ser: 1.41 mg/dL — ABNORMAL HIGH (ref 0.61–1.24)
GFR, Estimated: 53 mL/min — ABNORMAL LOW (ref >60)
Glucose, Bld: 177 mg/dL — ABNORMAL HIGH (ref 70–99)
Potassium: 4.3 mmol/L (ref 3.5–5.1)
Sodium: 131 mmol/L — ABNORMAL LOW (ref 135–145)

## 2020-09-23 LAB — GLUCOSE, CAPILLARY
Glucose-Capillary: 154 mg/dL — ABNORMAL HIGH (ref 70–99)
Glucose-Capillary: 298 mg/dL — ABNORMAL HIGH (ref 70–99)
Glucose-Capillary: 249 mg/dL — ABNORMAL HIGH (ref 70–99)
Glucose-Capillary: 143 mg/dL — ABNORMAL HIGH (ref 70–99)

## 2020-09-23 NOTE — Progress Notes (Signed)
Family Medicine Teaching Service Daily Progress Note Intern Pager: [phone]  Patient name: Gary [name] Medical record number: [id number] Date of birth: Jun 22, 1950 Age: 70 y.o. Gender: male  Primary Care Provider: Jalene Mullet, PA-C Consultants: Cardiology, Pulmonology, Heart Failure Code Status: FULL  Pt Overview and Major Events to Date:  4/18 admitted 4/19 right/left heart cath with concern for pulmonary HTN 4/22 2u pRBC, intubated, EGD (nonbleeding duodenal ulcers), transferred to ICU 4/26 extubated, 1u pRBC 4/28 FPTS resumed care 5/3: 1200 cc serosanguinous fluid removed via thoracentesis 5/5: HR CT with spiculated mass in RML with hilar adenopathy; started on pulse dose steroids   Assessment and Plan: Acute Hypoxemic Respiratory Failure 2/2 PAH with cor pulmonale On pulse-dose steroids with some improvement in oxygen requirements. Was able to decrease down to 15L at 40% FiO2 yesterday but increased to 25L at 60% overnight due to desats.  Repeat CXR yesterday showed only small right pleural effusion.  -Cardiology following; appreciate recommendations -Try to avoid long-term Amio if possible  -Potential repeat cath tomorrow, 5/9 -Can consider TEE guided DC-CV if able to tolerate anticoagulation without recurrent bleeding  -Pulmonology following; appreciation recommendations             -Continuing pulse-dose steroids              -Continue PPI for GI ppx              -If more stable: bronch and EBUS for lung mass -Continue Sildenafil 40 mg TID -Continue LABA/LAMA/ICS -Wean O2 as tolerated to maintain sats greater than 90% -Pulmonary hygiene, mobilization  AKI: Stable, improving  Creatinine 1.2>1.53>1.41. Bump was most likely 2/2 previous Lasix dose given.  Paroxysmal Atrial Fibrillation/Atrial Flutter Rate controlled.  -Cardiology following, appreciate recommendations.  -Holding AC due to recent GIB  requiring transfusions; May be able to resume this week -Continue Amiodarone 400 mg daily   [name]: Stable Hgbstable, 9.3.Transfusion threshold 8 given CAD.Ontriple therapy. -Triple therapy: Amoxicillin 1g BID, Clarithromycin 500 mg BID, Protonix 40 mg BID x14 days(5/2-5/15) -Will need TOCoutpatientafter treatment  Thrombocytosis Platelets 603 yesterday. Likely reactive.  Severe Protein Calorie Malnutrition -Continue nutritional supplementation  CAD: Chronic, stable -Atorvastatin 40 mg daily -Holding ASA; can potentially start this week if anticoagulation is resumed  Type 2 DM: Chronic, stable CBG'swell controlled, 123- 291 in last 24 hours. Home medications include Metformin, pioglitazone and Glipizide.  -Monitor CBG's -Continue Lantus at 15 units daily -mSSI with HS and mealtime coverage -Continuemetformin 1000 mg BID -Continue10 mg Empagliflozin -HoldhomeGlipizideand pioglitazone  Hypertension with recent Hypotension BP's ranging84-101/51-64 -Continue Midodrine 10 mg TID  -Monitor BP's  FEN/GI:Dysphagia 3 NID:POEU   Status is: Inpatient  Remains inpatient appropriate because:Ongoing diagnostic testing needed not appropriate for outpatient work up and Inpatient level of care appropriate due to severity of illness   Dispo: The patient is from: Home              Anticipated d/c is to: Home vs. CIR              Patient currently is not medically stable to d/c.   Difficult to place patient No   Subjective:  Patient feels well this morning.  He has no complaints.  States that he was able to come down to 15 L and 40% yesterday.  During this time he had remind himself to breathe through his nose, had a little bit of hard time but got used to it.  He required increased oxygen supplementation at  night for sleep.  He is thankful he had a good day yesterday and grateful for the progress he has made.  Objective: Temp:  [97.4 F  (36.3 C)-98.2 F (36.8 C)] 98.2 F (36.8 C) (05/08 0300) Pulse Rate:  [83-100] 96 (05/08 0300) Resp:  [15-28] 20 (05/08 0330) BP: (84-101)/(51-64) 97/63 (05/08 0300) SpO2:  [85 %-99 %] 94 % (05/08 0300) FiO2 (%):  [40 %-60 %] 60 % (05/08 0300) Weight:  [84.4 kg] 84.4 kg (05/08 0500) Physical Exam: General: Awake, alert, pleasant, conversational, in no distress Cardiovascular: Irregularly irregular, rate controlled Respiratory: CTA B, without wheezing/rhonchi/rales, 25 L HFNC at 60% FiO2 Extremities: Compressions stockings in place, 1-2+ pitting edema bilateral ankles  Laboratory: Recent Labs  Lab 09/20/20 0047 09/21/20 0120 09/22/20 0025  WBC 8.9 5.0 10.6*  HGB 8.4* 10.0* 9.3*  HCT 26.9* 31.5* 29.0*  PLT 502* 609* 603*   Recent Labs  Lab 09/18/20 1310 09/19/20 0306 09/21/20 0120 09/22/20 0025 09/23/20 0042  NA  --    < > 130* 131* 131*  K  --    < > 5.2* 4.4 4.3  CL  --    < > 92* 91* 93*  CO2  --    < > 26 22 25   BUN  --    < > 28* 48* 49*  CREATININE  --    < > 1.20 1.53* 1.41*  CALCIUM  --    < > 8.1* 8.0* 7.8*  PROT 5.7*  --   --   --   --   GLUCOSE  --    < > 198* 167* 177*   < > = values in this interval not displayed.    Imaging/Diagnostic Tests: No results found.   Sharion Settler, DO 09/23/2020, 7:29 AM PGY-1, [facility] Intern pager: [phone], text pages welcome

## 2020-09-23 NOTE — Plan of Care (Signed)
  Problem: Education: Goal: Knowledge of General Education information will improve Description: Including pain rating scale, medication(s)/side effects and non-pharmacologic comfort measures Outcome: Progressing   Problem: Health Behavior/Discharge Planning: Goal: Ability to manage health-related needs will improve Outcome: Progressing   Problem: Clinical Measurements: Goal: Ability to maintain clinical measurements within normal limits will improve Outcome: Progressing Goal: Will remain free from infection Outcome: Progressing Goal: Diagnostic test results will improve Outcome: Progressing Goal: Respiratory complications will improve Outcome: Progressing Goal: Cardiovascular complication will be avoided Outcome: Progressing   Problem: Activity: Goal: Risk for activity intolerance will decrease Outcome: Progressing   Problem: Nutrition: Goal: Adequate nutrition will be maintained Outcome: Progressing   Problem: Coping: Goal: Level of anxiety will decrease Outcome: Progressing   Problem: Elimination: Goal: Will not experience complications related to bowel motility Outcome: Progressing Goal: Will not experience complications related to urinary retention Outcome: Progressing   Problem: Pain Managment: Goal: General experience of comfort will improve Outcome: Progressing   Problem: Safety: Goal: Ability to remain free from injury will improve Outcome: Progressing   Problem: Skin Integrity: Goal: Risk for impaired skin integrity will decrease Outcome: Progressing   Problem: Education: Goal: Ability to demonstrate management of disease process will improve Outcome: Progressing Goal: Ability to verbalize understanding of medication therapies will improve Outcome: Progressing Goal: Individualized Educational Video(s) Outcome: Progressing   Problem: Activity: Goal: Capacity to carry out activities will improve Outcome: Progressing   Problem: Cardiac: Goal:  Ability to achieve and maintain adequate cardiopulmonary perfusion will improve Outcome: Progressing   Problem: Activity: Goal: Ability to tolerate increased activity will improve Outcome: Progressing   Problem: Respiratory: Goal: Ability to maintain a clear airway and adequate ventilation will improve Outcome: Progressing   Problem: Role Relationship: Goal: Method of communication will improve Outcome: Progressing

## 2020-09-23 NOTE — Progress Notes (Signed)
Family Medicine Teaching Service Daily Progress Note Intern Pager: [phone]  Patient name: Gary [name] Medical record number: [id number] Date of birth: Sep 07, 1950 Age: 70 y.o. Gender: male  Primary Care Provider: Jalene Mullet, PA-C Consultants: Cardiology, Heart Failure, Pulmonology  Code Status: FULL  Pt Overview and Major Events to Date:  4/18 admitted 4/19 right/left heart cath with concern for pulmonary HTN 4/22 2u pRBC, intubated, EGD (nonbleeding duodenal ulcers), transferred to ICU 4/26 extubated, 1u pRBC 4/28 FPTS resumed care 5/3: 1200 cc serosanguinous fluid removed via thoracentesis 5/5: HR CT with spiculated mass in RML with hilar adenopathy; started on pulse dose steroids   Assessment and Plan: [name] a 69 y.o.malewhopresentedwith acute hypoxemic respiratory failure 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 PMHx significant for: HTN, T2DM, HLD, depression.  Acute Hypoxemic Respiratory Failure 2/2 PAH with cor pulmonale On pulse-dose steroids with some improvement in oxygen requirements. Oxygen able to be weaned to 22L at 50% FiO2 yesterday. Patient does require increased oxygen requirements at night, currently back to 30L at 60% FiO2. Potential repeat RHC today.  -Cardiology following; appreciate recommendations -Try to avoid long-term Amio if possible             -Potential repeat RHC today -Can consider TEE guided DC-CV if able to tolerate anticoagulation without recurrent bleeding  -Pulmonology following; appreciation recommendations -Continuing pulse-dose steroids  -Continue PPI for GI ppx  -If more stable: bronch and EBUS for lung mass -Continue Sildenafil 40 mg TID -Continue LABA/LAMA/ICS -Wean O2 as tolerated to maintain sats greater than 90% -Pulmonary hygiene, mobilization  AKI:  Stable, improving  Creatinine 1.53>1.41>1.29.  Paroxysmal Atrial Fibrillation/Atrial Flutter Rate controlled.  -Cardiology following, appreciate recommendations.  -Holding AC due to recent GIB requiring transfusions; May be able to resume this week -Continue Amiodarone 400 mg daily   [name]: Stable Hgb8.7.Transfusion threshold 8 given CAD.Ontriple therapy. -Triple therapy: Amoxicillin 1g BID, Clarithromycin 500 mg BID, Protonix 40 mg BID x14 days(5/2-5/15) -Will need TOCoutpatientafter treatment  Thrombocytosis Platelets 603>573. Likely reactive.  Severe Protein Calorie Malnutrition -Continue nutritional supplementation  CAD: Chronic, stable -Atorvastatin 40 mg daily -Holding ASA; can potentially start this week if anticoagulation is resumed  Type 2 DM: Chronic, stable CBG's143-298 in last 24 hours. Home medications include Metformin, pioglitazone and Glipizide.  -Monitor CBG's -Increase Lantus to 20 units daily  -mSSI with HS and mealtime coverage -Continuemetformin 1000 mg BID -Continue10 mg Empagliflozin -HoldhomeGlipizideand pioglitazone  Hypertension with recent Hypotension BP's ranging 92-97/48-56. -Continue Midodrine 10 mg TID  -Monitor BP's  FEN/GI: NPO for potential RHC today ELT:RVUY   Status is: Inpatient  Remains inpatient appropriate because:Ongoing diagnostic testing needed not appropriate for outpatient work up and Inpatient level of care appropriate due to severity of illness   Dispo: The patient is from: Home              Anticipated d/c is to: Home vs. CIR              Patient currently is not medically stable to d/c.   Difficult to place patient No   Subjective:  Patient feels well this morning.  He is aware of potential repeat right heart cath today.  He is amenable to what ever other testing to be done.  He is in good spirits today, without complaints.  Objective: Temp:  [97.4 F (36.3 C)-98.2  F (36.8 C)] 97.9 F (36.6 C) (05/08 1932) Pulse Rate:  [  83-105] 103 (05/08 1932) Resp:  [14-27] 14 (05/08 1932) BP: (92-97)/(48-63) 93/49 (05/08 1932) SpO2:  [88 %-96 %] 90 % (05/08 1932) FiO2 (%):  [50 %-60 %] 60 % (05/08 1937) Weight:  [84.4 kg] 84.4 kg (05/08 0500) Physical Exam: General: Awake, alert, pleasant, in no distress Cardiovascular: Irregularly irregular rate and rhythm Respiratory: On HFNC 30 L, 50% FiO2, O2 sats 88-94%, diminished breath sounds throughout, in no respiratory distress, speaking in full sentences  Extremities: Compression stockings in place b/l, 1-2+ pitting edema b/l ankles   Laboratory: Recent Labs  Lab 09/20/20 0047 09/21/20 0120 09/22/20 0025  WBC 8.9 5.0 10.6*  HGB 8.4* 10.0* 9.3*  HCT 26.9* 31.5* 29.0*  PLT 502* 609* 603*   Recent Labs  Lab 09/18/20 1310 09/19/20 0306 09/21/20 0120 09/22/20 0025 09/23/20 0042  NA  --    < > 130* 131* 131*  K  --    < > 5.2* 4.4 4.3  CL  --    < > 92* 91* 93*  CO2  --    < > 26 22 25   BUN  --    < > 28* 48* 49*  CREATININE  --    < > 1.20 1.53* 1.41*  CALCIUM  --    < > 8.1* 8.0* 7.8*  PROT 5.7*  --   --   --   --   GLUCOSE  --    < > 198* 167* 177*   < > = values in this interval not displayed.    Imaging/Diagnostic Tests: No results found.  Sharion Settler, DO 09/23/2020, 7:44 PM PGY-1, [place] Intern pager: [phone], text pages welcome

## 2020-09-23 NOTE — Progress Notes (Signed)
Fio2 and flow was increased by RN earlier in the shift to compensate for pt. Sats.

## 2020-09-23 NOTE — Progress Notes (Signed)
Patient refused CPAP tonight

## 2020-09-23 NOTE — Progress Notes (Signed)
Progress Note  Patient Name: Gary [name] Date of Encounter: 09/23/2020  [facility] Cardiologist: Elouise Munroe, MD   Subjective   Unable to wean down his oxygen any further. Urine output 1850 mL yesterday, but no intake was recorded, weight essentially unchanged  Inpatient Medications    Scheduled Meds: . sodium chloride   Intravenous Once  . amiodarone  200 mg Oral Daily  . amoxicillin  1,000 mg Oral Q12H  . arformoterol  15 mcg Nebulization BID  . atorvastatin  40 mg Oral Daily  . budesonide  0.5 mg Nebulization BID  . chlorhexidine  15 mL Mouth Rinse BID  . clarithromycin  500 mg Oral Q12H  . empagliflozin  10 mg Oral Daily  . feeding supplement (GLUCERNA SHAKE)  237 mL Oral TID BM  . ferrous sulfate  325 mg Oral Q breakfast  . insulin aspart  0-15 Units Subcutaneous TID WC  . insulin aspart  0-5 Units Subcutaneous QHS  . insulin aspart  6 Units Subcutaneous TID WC  . insulin glargine  15 Units Subcutaneous Daily  . mouth rinse  15 mL Mouth Rinse BID  . mouth rinse  15 mL Mouth Rinse q12n4p  . metFORMIN  1,000 mg Oral BID WC  . midodrine  10 mg Oral TID WC  . multivitamin with minerals  1 tablet Oral Daily  . pantoprazole  40 mg Oral BID  . polyethylene glycol  17 g Oral Daily  . revefenacin  175 mcg Nebulization Daily  . saline  1 application Each Nare BID  . sildenafil  40 mg Oral TID  . sodium chloride flush  10-40 mL Intracatheter Q12H   Continuous Infusions: . sodium chloride Stopped (09/10/20 1336)  . sodium chloride 10 mL/hr at 09/13/20 0900  . methylPREDNISolone (SOLU-MEDROL) injection 250 mg (09/23/20 0548)   PRN Meds: sodium chloride, acetaminophen, guaiFENesin-dextromethorphan, ondansetron (ZOFRAN) IV, sodium chloride, sodium chloride flush   Vital Signs    Vitals:   09/23/20 0500 09/23/20 0840 09/23/20 0842 09/23/20 0916  BP:    (!) 92/53  Pulse:    98  Resp:    19  Temp:    (!) 97.4 F (36.3 C)  TempSrc:    Oral  SpO2:  92% 90% 90%   Weight: 84.4 kg     Height:        Intake/Output Summary (Last 24 hours) at 09/23/2020 1043 Last data filed at 09/23/2020 0600 Gross per 24 hour  Intake --  Output 1850 ml  Net -1850 ml   Last 3 Weights 09/23/2020 09/22/2020 09/21/2020  Weight (lbs) 186 lb 1.1 oz 185 lb 6.5 oz 182 lb 15.7 oz  Weight (kg) 84.4 kg 84.1 kg 83 kg      Telemetry    Atrial flutter with variable block, ventricular rates mostly 90-100- Personally Reviewed  ECG    No new tracing- Personally Reviewed  Physical Exam  Appears comfortable sitting up in chair GEN: No acute distress.   Neck:  6-7 cm JVD Cardiac: RRR, no murmurs, rubs, or gallops.  Respiratory: Clear to auscultation bilaterally. GI: Soft, nontender, non-distended  MS: No edema; No deformity. Neuro:  Nonfocal  Psych: Normal affect   Labs    High Sensitivity Troponin:   Recent Labs  Lab 09/03/20 1040 09/03/20 1433 09/03/20 1803  TROPONINIHS 98* 101* 98*      Chemistry Recent Labs  Lab 09/18/20 1310 09/19/20 0306 09/21/20 0120 09/22/20 0025 09/23/20 0042  NA  --    < >  130* 131* 131*  K  --    < > 5.2* 4.4 4.3  CL  --    < > 92* 91* 93*  CO2  --    < > 26 22 25   GLUCOSE  --    < > 198* 167* 177*  BUN  --    < > 28* 48* 49*  CREATININE  --    < > 1.20 1.53* 1.41*  CALCIUM  --    < > 8.1* 8.0* 7.8*  PROT 5.7*  --   --   --   --   ALBUMIN 1.9*  --   --   --   --   GFRNONAA  --    < > >60 48* 53*  ANIONGAP  --    < > 12 18* 13   < > = values in this interval not displayed.     Hematology Recent Labs  Lab 09/20/20 0047 09/21/20 0120 09/22/20 0025  WBC 8.9 5.0 10.6*  RBC 2.93* 3.50* 3.16*  HGB 8.4* 10.0* 9.3*  HCT 26.9* 31.5* 29.0*  MCV 91.8 90.0 91.8  MCH 28.7 28.6 29.4  MCHC 31.2 31.7 32.1  RDW 14.9 15.0 15.1  PLT 502* 609* 603*    BNP Recent Labs  Lab 09/20/20 0047  BNP 360.6*     DDimer No results for input(s): DDIMER in the last 168 hours.   Radiology    DG CHEST PORT 1 VIEW  Result Date:  09/22/2020 CLINICAL DATA:  Reason for exam: Right side pleural effusion Hx of diabetes, HTN EXAM: PORTABLE CHEST 1 VIEW COMPARISON:  09/18/2020.  CT, 09/20/2020. FINDINGS: Cardiac silhouette borderline enlarged. No mediastinal or hilar masses. Patchy opacity noted in the right lung base extending from the inferior right hilum. There is mild interstitial thickening and vascular prominence most evident in the bases. Small right pleural effusion. No pneumothorax. IMPRESSION: 1. Findings similar to the recent chest CT with patchy opacity at the right lung base. This is partly due to the 3.4 cm spiculated mass noted on the prior CT, not defined radiographically, as well as additional airspace and interstitial opacities. Small right pleural effusion. These findings suggest mild congestive heart failure/pulmonary edema. Electronically Signed   By: Lajean Manes M.D.   On: 09/22/2020 08:28    Cardiac Studies    Cardiac catheterization 09/05/2018.    Dist RCA lesion is 100% stenosed.  Ost LAD to Mid LAD lesion is 20% stenosed.  Mid LAD lesion is 40% stenosed.  Prox RCA lesion is 40% stenosed.  Findings:  Ao = 95/60 (76) LV = 95/14  RA = 12 RV = 60/13 PA = 66/31 (46) PCW = 13 Fick cardiac output/index = 4.1/2.1 PVR = 7.8 WU Ao sat = 99% PA sat =63%, 65%  Assessment: 1. 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 There is a large RV marginal branch that is widely patent 2. EF 60-65% 3. Moderate PAH with elevated PVR and moderately reduced cardiac output  Echocardiogram 09/03/2020  1. There is severe RV enlargement with severely depressed RV systolic function and moderately elevated PASP. The interventricular septum is flattened in systole consistent with RV pressure overload. Constellation of findings highly concerning for pulmonary embolism. Cardiology team aware and CTA pending.  2. Left ventricular ejection fraction, by estimation,  is 55 to 60%. The left ventricle has normal function. Wall motion difficult to assess due to poor visualization of the LV endocardium but appears grossly  normal. There is mild concentric left ventricular hypertrophy. Diastolic function is indeterminant due to atrial fibrillation. There is the interventricular septum is flattened in systole, consistent with right ventricular pressure overload.  3. Right ventricular systolic function is severely reduced. The right ventricular size is severely enlarged. There is moderately elevatedpulmonary artery systolic pressure. The estimated right ventricular  systolic pressure is 35.8 mmHg.  4. Right atrial size was moderately dilated.  5. The mitral valve is grossly normal. Mild mitral valve regurgitation.  6. Tricuspid valve regurgitation is moderate.  7. The aortic valve is tricuspid. There is moderate calcification of the aortic valve. There is moderate thickening of the aortic valve. Aortic valve regurgitation is trivial. Mild to moderate aortic valve  sclerosis/calcification is present, without any evidence of aortic stenosis.  8. The inferior vena cava is dilated in size with <50% respiratory variability, suggesting right atrial pressure of 15 mmHg.   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 Successfully extubated 04 26, still requiring high flow oxygen, steadily weaning down.  Assessment & Plan    1. Acute resp failure: due to intrinsic lung disease.  Unable to wean oxygen any further.  I suspect any residual hypoxemia is  related to his lung problems and that he is euvolemic based on clinical exam and the results of previous right heart cath. Background of OSA and interstitial lung disease. Received steroids. Hi-res CT: "Scattered ground-glass and irregular heterogeneous airspace opacity throughout the lungs, nonspecific and infectious or Inflammatory". 2. PAH: mostly WHO group 3. 3. CHF: euvolemic in my opinion. Suspect dry weight is 185-190.  Note Dr. Clayborne Dana plan for possible right heart catheterization again on Monday to make sure that we have optimized him hemodynamically. 4. AFlutter: secondary to cor pulmonale. Unable to cardiovert until we can restart anticoagulation. Challenging rate control due to low BP (on midodrine). On PO amio, consider adding digoxin, but risk of toxicity is substantial. 5. CAD: single vessel disease, asymptomatic. 6. GI bleed: would be able to start anticoagulation later this week. If this is successful, without recurrent bleeding, can consider TEE guided DCCV. 7. 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          For questions or updates, please contact [place] Please consult [email or web address] for contact info under        Signed, Sanda Klein, MD  09/23/2020, 10:43 AM

## 2020-09-24 ENCOUNTER — Inpatient Hospital Stay (HOSPITAL_COMMUNITY)
Admission: EM | Disposition: A | Discharge status: Rehabilitation Facility | Payer: Self-pay | Source: Home / Self Care | Attending: Family Medicine

## 2020-09-24 DIAGNOSIS — J9601 Acute respiratory failure with hypoxia: Secondary | ICD-10-CM | POA: Diagnosis not present

## 2020-09-24 DIAGNOSIS — I50813 Acute on chronic right heart failure: Secondary | ICD-10-CM | POA: Diagnosis not present

## 2020-09-24 DIAGNOSIS — I480 Paroxysmal atrial fibrillation: Secondary | ICD-10-CM | POA: Diagnosis not present

## 2020-09-24 DIAGNOSIS — R0902 Hypoxemia: Secondary | ICD-10-CM | POA: Diagnosis not present

## 2020-09-24 DIAGNOSIS — I4891 Unspecified atrial fibrillation: Secondary | ICD-10-CM | POA: Diagnosis not present

## 2020-09-24 DIAGNOSIS — I2721 Secondary pulmonary arterial hypertension: Secondary | ICD-10-CM | POA: Diagnosis not present

## 2020-09-24 DIAGNOSIS — J80 Acute respiratory distress syndrome: Secondary | ICD-10-CM | POA: Diagnosis not present

## 2020-09-24 HISTORY — PX: RIGHT HEART CATH: CATH118263

## 2020-09-24 LAB — POCT I-STAT EG7
Acid-Base Excess: 7 mmol/L — ABNORMAL HIGH (ref 0.0–2.0)
Acid-Base Excess: 8 mmol/L — ABNORMAL HIGH (ref 0.0–2.0)
Acid-Base Excess: 9 mmol/L — ABNORMAL HIGH (ref 0.0–2.0)
Bicarbonate: 31.9 mmol/L — ABNORMAL HIGH (ref 20.0–28.0)
Bicarbonate: 31.9 mmol/L — ABNORMAL HIGH (ref 20.0–28.0)
Bicarbonate: 32.6 mmol/L — ABNORMAL HIGH (ref 20.0–28.0)
Calcium, Ion: 0.99 mmol/L — ABNORMAL LOW (ref 1.15–1.40)
Calcium, Ion: 1 mmol/L — ABNORMAL LOW (ref 1.15–1.40)
Calcium, Ion: 1.04 mmol/L — ABNORMAL LOW (ref 1.15–1.40)
HCT: 25 % — ABNORMAL LOW (ref 39.0–52.0)
HCT: 25 % — ABNORMAL LOW (ref 39.0–52.0)
HCT: 26 % — ABNORMAL LOW (ref 39.0–52.0)
Hemoglobin: 8.5 g/dL — ABNORMAL LOW (ref 13.0–17.0)
Hemoglobin: 8.5 g/dL — ABNORMAL LOW (ref 13.0–17.0)
Hemoglobin: 8.8 g/dL — ABNORMAL LOW (ref 13.0–17.0)
O2 Saturation: 78 %
O2 Saturation: 79 %
O2 Saturation: 80 %
Potassium: 3.2 mmol/L — ABNORMAL LOW (ref 3.5–5.1)
Potassium: 3.2 mmol/L — ABNORMAL LOW (ref 3.5–5.1)
Potassium: 3.4 mmol/L — ABNORMAL LOW (ref 3.5–5.1)
Sodium: 138 mmol/L (ref 135–145)
Sodium: 139 mmol/L (ref 135–145)
Sodium: 140 mmol/L (ref 135–145)
TCO2: 33 mmol/L — ABNORMAL HIGH (ref 22–32)
TCO2: 33 mmol/L — ABNORMAL HIGH (ref 22–32)
TCO2: 34 mmol/L — ABNORMAL HIGH (ref 22–32)
pCO2, Ven: 41.3 mmHg — ABNORMAL LOW (ref 44.0–60.0)
pCO2, Ven: 41.7 mmHg — ABNORMAL LOW (ref 44.0–60.0)
pCO2, Ven: 44.8 mmHg (ref 44.0–60.0)
pH, Ven: 7.461 — ABNORMAL HIGH (ref 7.250–7.430)
pH, Ven: 7.496 — ABNORMAL HIGH (ref 7.250–7.430)
pH, Ven: 7.501 — ABNORMAL HIGH (ref 7.250–7.430)
pO2, Ven: 39 mmHg (ref 32.0–45.0)
pO2, Ven: 39 mmHg (ref 32.0–45.0)
pO2, Ven: 42 mmHg (ref 32.0–45.0)

## 2020-09-24 LAB — CBC
HCT: 27 % — ABNORMAL LOW (ref 39.0–52.0)
Hemoglobin: 8.7 g/dL — ABNORMAL LOW (ref 13.0–17.0)
MCH: 29.1 pg (ref 26.0–34.0)
MCHC: 32.2 g/dL (ref 30.0–36.0)
MCV: 90.3 fL (ref 80.0–100.0)
Platelets: 573 10*3/uL — ABNORMAL HIGH (ref 150–400)
RBC: 2.99 MIL/uL — ABNORMAL LOW (ref 4.22–5.81)
RDW: 15.1 % (ref 11.5–15.5)
WBC: 9.1 10*3/uL (ref 4.0–10.5)
nRBC: 0 % (ref 0.0–0.2)

## 2020-09-24 LAB — GLUCOSE, CAPILLARY
Glucose-Capillary: 174 mg/dL — ABNORMAL HIGH (ref 70–99)
Glucose-Capillary: 174 mg/dL — ABNORMAL HIGH (ref 70–99)
Glucose-Capillary: 153 mg/dL — ABNORMAL HIGH (ref 70–99)
Glucose-Capillary: 152 mg/dL — ABNORMAL HIGH (ref 70–99)

## 2020-09-24 LAB — BASIC METABOLIC PANEL
Anion gap: 11 (ref 5–15)
BUN: 53 mg/dL — ABNORMAL HIGH (ref 8–23)
CO2: 26 mmol/L (ref 22–32)
Calcium: 7.8 mg/dL — ABNORMAL LOW (ref 8.9–10.3)
Chloride: 95 mmol/L — ABNORMAL LOW (ref 98–111)
Creatinine, Ser: 1.29 mg/dL — ABNORMAL HIGH (ref 0.61–1.24)
GFR, Estimated: 59 mL/min — ABNORMAL LOW (ref >60)
Glucose, Bld: 157 mg/dL — ABNORMAL HIGH (ref 70–99)
Potassium: 4 mmol/L (ref 3.5–5.1)
Sodium: 132 mmol/L — ABNORMAL LOW (ref 135–145)

## 2020-09-24 SURGERY — RIGHT HEART CATH
Anesthesia: LOCAL

## 2020-09-24 MED ORDER — INSULIN GLARGINE 100 UNIT/ML ~~LOC~~ SOLN
20.0000 [IU] | Freq: Every day | SUBCUTANEOUS | Status: DC
  Administered 2020-09-24 – 2020-09-25 (×2): 20 [IU] via SUBCUTANEOUS
  Filled 2020-09-24 (×3): qty 0.2

## 2020-09-24 MED ORDER — ACETAMINOPHEN 325 MG PO TABS
650.0000 mg | ORAL_TABLET | ORAL | Status: DC | PRN
  Administered 2020-09-26: 650 mg via ORAL
  Filled 2020-09-24: qty 2

## 2020-09-24 MED ORDER — METHYLPREDNISOLONE SODIUM SUCC 125 MG IJ SOLR: 60.0000 mg | Freq: Every day | INTRAMUSCULAR | Status: DC

## 2020-09-24 MED ORDER — ASPIRIN 81 MG PO CHEW
81.0000 mg | CHEWABLE_TABLET | ORAL | Status: DC
Start: 2020-09-25 — End: 2020-09-24

## 2020-09-24 MED ORDER — HEPARIN (PORCINE) IN NACL 1000-0.9 UT/500ML-% IV SOLN
INTRAVENOUS | Status: AC
  Filled 2020-09-24: qty 500

## 2020-09-24 MED ORDER — ASPIRIN 81 MG PO CHEW
81.0000 mg | CHEWABLE_TABLET | ORAL | Status: AC
  Administered 2020-09-24: 81 mg via ORAL
  Filled 2020-09-24: qty 1

## 2020-09-24 MED ORDER — LIDOCAINE HCL (PF) 1 % IJ SOLN
INTRAMUSCULAR | Status: AC
  Filled 2020-09-24: qty 30

## 2020-09-24 MED ORDER — SODIUM CHLORIDE 0.9% FLUSH
3.0000 mL | Freq: Two times a day (BID) | INTRAVENOUS | Status: DC
  Administered 2020-09-24 – 2020-10-02 (×14): 3 mL via INTRAVENOUS

## 2020-09-24 MED ORDER — SODIUM CHLORIDE 0.9 % IV SOLN: 250.0000 mL | INTRAVENOUS | Status: DC | PRN

## 2020-09-24 MED ORDER — ONDANSETRON HCL 4 MG/2ML IJ SOLN: 4.0000 mg | Freq: Four times a day (QID) | INTRAMUSCULAR | Status: DC | PRN

## 2020-09-24 MED ORDER — HYDRALAZINE HCL 20 MG/ML IJ SOLN: 10.0000 mg | INTRAMUSCULAR | Status: AC | PRN

## 2020-09-24 MED ORDER — LABETALOL HCL 5 MG/ML IV SOLN: 10.0000 mg | INTRAVENOUS | Status: AC | PRN

## 2020-09-24 MED ORDER — SODIUM CHLORIDE 0.9 % IV SOLN: INTRAVENOUS | Status: DC

## 2020-09-24 MED ORDER — FUROSEMIDE 10 MG/ML IJ SOLN
40.0000 mg | Freq: Once | INTRAMUSCULAR | Status: AC
  Administered 2020-09-24: 40 mg via INTRAVENOUS
  Filled 2020-09-24: qty 4

## 2020-09-24 MED ORDER — SODIUM CHLORIDE 0.9% FLUSH: 3.0000 mL | INTRAVENOUS | Status: DC | PRN

## 2020-09-24 MED ORDER — ENOXAPARIN SODIUM 40 MG/0.4ML IJ SOSY
40.0000 mg | PREFILLED_SYRINGE | INTRAMUSCULAR | Status: DC
  Administered 2020-09-25 – 2020-09-26 (×2): 40 mg via SUBCUTANEOUS
  Filled 2020-09-24 (×2): qty 0.4

## 2020-09-24 MED ORDER — HEPARIN (PORCINE) IN NACL 1000-0.9 UT/500ML-% IV SOLN
INTRAVENOUS | Status: DC | PRN
  Administered 2020-09-24: 500 mL

## 2020-09-24 MED ORDER — SODIUM CHLORIDE 0.9% FLUSH: 3.0000 mL | Freq: Two times a day (BID) | INTRAVENOUS | Status: DC

## 2020-09-24 SURGICAL SUPPLY — 4 items
CATH SWAN GANZ 7F STRAIGHT (CATHETERS) ×2 IMPLANT
GLIDESHEATH SLENDER 7FR .021G (SHEATH) ×2 IMPLANT
PACK CARDIAC CATHETERIZATION (CUSTOM PROCEDURE TRAY) ×2 IMPLANT
TRANSDUCER W/STOPCOCK (MISCELLANEOUS) ×2 IMPLANT

## 2020-09-24 NOTE — H&P (View-Only) (Signed)
Patient ID: Gary [name], male   DOB: 08/08/1950, 70 y.o.   MRN: [id number]     Advanced Heart Failure Rounding Note  PCP-Cardiologist: Elouise Munroe, MD   Subjective:   Events  4/22  EGD 4/22 showed 2 ulcers in duodenum that were not actively bleeding.  On PO Protonix, Hgb 10>9.3>8.6  4/23 Intubated. Dobutamine stopped due to marked tachycardia. Started antibiotics for pneumonia. Blood CX- NGTD. Sputum with strep.  4/24 Did not tolerate fast veletri wean.  4/25 Veletri stopped. Extubated. Given 1u PRBC  5/1 Amio gtt stopped>>PO 400 mg daily  5/3 Right thoracentesis with 1200cc out (mostly transudative) 5/5 In A fib . High Res CT- redemonstrated spiculated mass anterior RML with trace left pleural effusion.  5/5 Started on pulse-dose steroids   Wt higher today, up 6 lb, 186>>192 lb. ? Accurate -1.5L in UOP yesterday.   On HFNC 30 liters FiO2 60%>>50% today.   Remains in A fib 90-110s on po amio. Hgb 8.7   He notes significant symptomatic improvement over the last 2-3 days, from breathing standpoint, w/ steroids.    Objective:   Weight Range: 87.1 kg Body mass index is 30.07 kg/m.   Vital Signs:   Temp:  [97.4 F (36.3 C)-97.9 F (36.6 C)] 97.6 F (36.4 C) (05/09 0410) Pulse Rate:  [88-109] 89 (05/09 0411) Resp:  [14-23] 20 (05/09 0410) BP: (92-97)/(48-56) 97/56 (05/09 0410) SpO2:  [88 %-96 %] 96 % (05/09 0734) FiO2 (%):  [50 %-60 %] 50 % (05/09 0734) Weight:  [87.1 kg] 87.1 kg (05/09 0410) Last BM Date: 09/22/20  Weight change: Filed Weights   09/22/20 0339 09/23/20 0500 09/24/20 0410  Weight: 84.1 kg 84.4 kg 87.1 kg    Intake/Output:   Intake/Output Summary (Last 24 hours) at 09/24/2020 0743 Last data filed at 09/24/2020 0640 Gross per 24 hour  Intake --  Output 1500 ml  Net -1500 ml      Physical Exam   PHYSICAL EXAM: General:  Well appearing sitting up in chair, getting breathing tx. No respiratory difficulty HEENT: normal  Neck: supple. JVD not  well visualized. Carotids 2+ bilat; no bruits. No lymphadenopathy or thyromegaly appreciated. Cor: PMI nondisplaced. Irreg/irregular rhythm and rate. No rubs, gallops or murmurs. Lungs: decreased BS at the bases bilaterally w/ faint bibasilar crackles  Abdomen: soft, nontender, nondistended. No hepatosplenomegaly. No bruits or masses. Good bowel sounds. Extremities: no cyanosis, clubbing, rash, 1+ pretibial edema on the rt, trace edema on the left LE Neuro: alert & oriented x 3, cranial nerves grossly intact. moves all 4 extremities w/o difficulty. Affect pleasant.   Telemetry    A Fib 90-110s personally reviewed.   Labs    CBC Recent Labs    09/22/20 0025 09/24/20 0044  WBC 10.6* 9.1  HGB 9.3* 8.7*  HCT 29.0* 27.0*  MCV 91.8 90.3  PLT 603* 505*   Basic Metabolic Panel Recent Labs    09/23/20 0042 09/24/20 0044  NA 131* 132*  K 4.3 4.0  CL 93* 95*  CO2 25 26  GLUCOSE 177* 157*  BUN 49* 53*  CREATININE 1.41* 1.29*  CALCIUM 7.8* 7.8*   Liver Function Tests No results for input(s): AST, ALT, ALKPHOS, BILITOT, PROT, ALBUMIN in the last 72 hours. No results for input(s): LIPASE, AMYLASE in the last 72 hours. Cardiac Enzymes No results for input(s): CKTOTAL, CKMB, CKMBINDEX, TROPONINI in the last 72 hours.  BNP: BNP (last 3 results) Recent Labs    09/03/20 1433 09/20/20 0047  BNP 1,202.7* 360.6*    ProBNP (last 3 results) No results for input(s): PROBNP in the last 8760 hours.   D-Dimer No results for input(s): DDIMER in the last 72 hours. Hemoglobin A1C No results for input(s): HGBA1C in the last 72 hours. Fasting Lipid Panel No results for input(s): CHOL, HDL, LDLCALC, TRIG, CHOLHDL, LDLDIRECT in the last 72 hours. Thyroid Function Tests No results for input(s): TSH, T4TOTAL, T3FREE, THYROIDAB in the last 72 hours.  Invalid input(s): FREET3  Other results:   Imaging    No results found.   Medications:     Scheduled Medications: . sodium  chloride   Intravenous Once  . amiodarone  200 mg Oral Daily  . amoxicillin  1,000 mg Oral Q12H  . arformoterol  15 mcg Nebulization BID  . atorvastatin  40 mg Oral Daily  . budesonide  0.5 mg Nebulization BID  . chlorhexidine  15 mL Mouth Rinse BID  . clarithromycin  500 mg Oral Q12H  . empagliflozin  10 mg Oral Daily  . feeding supplement (GLUCERNA SHAKE)  237 mL Oral TID BM  . ferrous sulfate  325 mg Oral Q breakfast  . insulin aspart  0-15 Units Subcutaneous TID WC  . insulin aspart  0-5 Units Subcutaneous QHS  . insulin aspart  6 Units Subcutaneous TID WC  . insulin glargine  20 Units Subcutaneous Daily  . mouth rinse  15 mL Mouth Rinse BID  . mouth rinse  15 mL Mouth Rinse q12n4p  . metFORMIN  1,000 mg Oral BID WC  . midodrine  10 mg Oral TID WC  . multivitamin with minerals  1 tablet Oral Daily  . pantoprazole  40 mg Oral BID  . polyethylene glycol  17 g Oral Daily  . revefenacin  175 mcg Nebulization Daily  . saline  1 application Each Nare BID  . sildenafil  40 mg Oral TID  . sodium chloride flush  10-40 mL Intracatheter Q12H    Infusions: . sodium chloride Stopped (09/10/20 1336)  . sodium chloride 10 mL/hr at 09/13/20 0900  . methylPREDNISolone (SOLU-MEDROL) injection 250 mg (09/24/20 0520)    PRN Medications: sodium chloride, acetaminophen, guaiFENesin-dextromethorphan, ondansetron (ZOFRAN) IV, sodium chloride, sodium chloride flush    Assessment/Plan    1. Acute upper GI bleed with symptomatic anemia - hgb dropped to 6.7. Transfused.  - Eliquis off - CT negative for RP bleed - EGD with 2 ulcers not acutely bleeding.  - Hgb 9.3>>8.6 >> 8.5>>8.4>10>>9.3>>8.7  - On protonix PO 40 mg bid  - Off  AC for 2 weeks to allow GI healing. ? Can retry Eliquis this week  - Biopsy back for H pylori. On treatment per GI  2. PAH with cor pulmonale - CT chest 4/22: No PE or ILD - Echo LVEF 60% severe RV dilation and HK - Cath with moderate PAH. PA = 66/31 (46) PCW =  13 Fick = 4.1/2.1 PVR = 7.8 WU - Suspect this is WHO Group 3 PAH due to hypoxic lung disease/OHS/OSA - Will order PFTs (when better compensated) and will need outpatient sleep study - Serologies for completed. Note abnormal ANA, with borderline elevation in chromatin antibody, but all other serologies negative. ANCA pendingESR only mildly abnormal at 44  - On sildenafil 40 mg tid. On midodrine 10 mg three times a day.   - Volume status trending up. Give 40 mg IV lasix x1  - Continue jardiance 10 mg daily.   3. Paroxysmal AF - new  onset. Unclear duration  - Eliquis on hold d/t GI bleed - Was in NSR last week --> back in A fib over the last few days.   - No AC with recent large GI bleed - Continue SCDs.  - Continue PO amio 400 mg daily . Given he keep going back in a fib could consider toprol xl for rate control.  - Not candidate for DC-CV without AC.  - Hopefully can try to resume Eliquis this week. May be candidate for [facility] L[facility] closure if we can get him stable on [facility] for 1-2 months. Would avoid long-term amio use given underlying lung disease if at all possible.    4. CAD  -mostly non-obstructive - Continue atorvastatin 40 mg daily.  - No ASA with AC - No chest pain.   5. R lung mass - High Res CT- redemonstrated spiculated mass anterior RML with trace left pleural effusion.   6. Shock - Suspect mixed hemorrhagic/cardiogenic (RV failure).   - Resolved  7. Acute hypoxemic respiratory failure with bilateral pleural effusions - Intubated pre-EGD on 4/22.   - Extubated--> on HFNC - Respiratory status remains tenuous. As noted before, degree of hypoxemia far out of proportion to HF or PAH. Continue with aggressive pulmonary toilet and mobilization. - With response to O2 supplementation shunt physiology less likely.  - Now s/p R thora with 1200 cc out. (mostly transudative)  - CXR still with airspace disease  - High Res CT- redemonstrated spiculated mass anterior RML with trace  left pleural effusion.  8. Hyperkalemia - resolved, K 4.0 today   Length of Stay: [address], PA-C  09/24/2020, 7:43 AM  Advanced Heart Failure Team Pager [phone] (M-F; 7a - 5p)  Please contact [facility] for night-coverage after hours (5p -7a ) and weekends on [email or web address]  Patient seen and examined with the above-signed Advanced Practice Provider and/or Housestaff. I personally reviewed laboratory data, imaging studies and relevant notes. I independently examined the patient and formulated the important aspects of the plan. I have edited the note to reflect any of my changes or salient points. I have personally discussed the plan with the patient and/or family.  Remains on HFNC without much improvement despite pulse-dose steroids. Volume status trending up slightly. Received dose of IV lasix this am. Remains in rate-controlled AFL.   General:  Mild dyspnea. Remains on HFNC HEENT: normal Neck: supple. no JVD. Carotids 2+ bilat; no bruits. No lymphadenopathy or thryomegaly appreciated. Cor: PMI nondisplaced. Irregular rate & rhythm. No rubs, gallops or murmurs. Lungs: coarse Abdomen: soft, nontender, nondistended. No hepatosplenomegaly. No bruits or masses. Good bowel sounds. Extremities: no cyanosis, clubbing, rash, edema Neuro: alert & orientedx3, cranial nerves grossly intact. moves all 4 extremities w/o difficulty. Affect pleasant  Case d/w Dr. Carlis Abbott. Oxygen requirements remain out of proportion to other objective findings. Will plan repeat RHC to reassess pulmonary pressures. Consider restarting Eliquis if Hgb remains stable.   Glori Bickers, MD  12:41 PM

## 2020-09-24 NOTE — Progress Notes (Addendum)
Patient ID: Gary [name], male   DOB: 1950-11-12, 70 y.o.   MRN: [id number]     Advanced Heart Failure Rounding Note  PCP-Cardiologist: Elouise Munroe, MD   Subjective:   Events  4/22  EGD 4/22 showed 2 ulcers in duodenum that were not actively bleeding.  On PO Protonix, Hgb 10>9.3>8.6  4/23 Intubated. Dobutamine stopped due to marked tachycardia. Started antibiotics for pneumonia. Blood CX- NGTD. Sputum with strep.  4/24 Did not tolerate fast veletri wean.  4/25 Veletri stopped. Extubated. Given 1u PRBC  5/1 Amio gtt stopped>>PO 400 mg daily  5/3 Right thoracentesis with 1200cc out (mostly transudative) 5/5 In A fib . High Res CT- redemonstrated spiculated mass anterior RML with trace left pleural effusion.  5/5 Started on pulse-dose steroids   Wt higher today, up 6 lb, 186>>192 lb. ? Accurate -1.5L in UOP yesterday.   On HFNC 30 liters FiO2 60%>>50% today.   Remains in A fib 90-110s on po amio. Hgb 8.7   He notes significant symptomatic improvement over the last 2-3 days, from breathing standpoint, w/ steroids.    Objective:   Weight Range: 87.1 kg Body mass index is 30.07 kg/m.   Vital Signs:   Temp:  [97.4 F (36.3 C)-97.9 F (36.6 C)] 97.6 F (36.4 C) (05/09 0410) Pulse Rate:  [88-109] 89 (05/09 0411) Resp:  [14-23] 20 (05/09 0410) BP: (92-97)/(48-56) 97/56 (05/09 0410) SpO2:  [88 %-96 %] 96 % (05/09 0734) FiO2 (%):  [50 %-60 %] 50 % (05/09 0734) Weight:  [87.1 kg] 87.1 kg (05/09 0410) Last BM Date: 09/22/20  Weight change: Filed Weights   09/22/20 0339 09/23/20 0500 09/24/20 0410  Weight: 84.1 kg 84.4 kg 87.1 kg    Intake/Output:   Intake/Output Summary (Last 24 hours) at 09/24/2020 0743 Last data filed at 09/24/2020 0640 Gross per 24 hour  Intake --  Output 1500 ml  Net -1500 ml      Physical Exam   PHYSICAL EXAM: General:  Well appearing sitting up in chair, getting breathing tx. No respiratory difficulty HEENT: normal  Neck: supple. JVD not  well visualized. Carotids 2+ bilat; no bruits. No lymphadenopathy or thyromegaly appreciated. Cor: PMI nondisplaced. Irreg/irregular rhythm and rate. No rubs, gallops or murmurs. Lungs: decreased BS at the bases bilaterally w/ faint bibasilar crackles  Abdomen: soft, nontender, nondistended. No hepatosplenomegaly. No bruits or masses. Good bowel sounds. Extremities: no cyanosis, clubbing, rash, 1+ pretibial edema on the rt, trace edema on the left LE Neuro: alert & oriented x 3, cranial nerves grossly intact. moves all 4 extremities w/o difficulty. Affect pleasant.   Telemetry    A Fib 90-110s personally reviewed.   Labs    CBC Recent Labs    09/22/20 0025 09/24/20 0044  WBC 10.6* 9.1  HGB 9.3* 8.7*  HCT 29.0* 27.0*  MCV 91.8 90.3  PLT 603* 462*   Basic Metabolic Panel Recent Labs    09/23/20 0042 09/24/20 0044  NA 131* 132*  K 4.3 4.0  CL 93* 95*  CO2 25 26  GLUCOSE 177* 157*  BUN 49* 53*  CREATININE 1.41* 1.29*  CALCIUM 7.8* 7.8*   Liver Function Tests No results for input(s): AST, ALT, ALKPHOS, BILITOT, PROT, ALBUMIN in the last 72 hours. No results for input(s): LIPASE, AMYLASE in the last 72 hours. Cardiac Enzymes No results for input(s): CKTOTAL, CKMB, CKMBINDEX, TROPONINI in the last 72 hours.  BNP: BNP (last 3 results) Recent Labs    09/03/20 1433 09/20/20 0047  BNP 1,202.7* 360.6*    ProBNP (last 3 results) No results for input(s): PROBNP in the last 8760 hours.   D-Dimer No results for input(s): DDIMER in the last 72 hours. Hemoglobin A1C No results for input(s): HGBA1C in the last 72 hours. Fasting Lipid Panel No results for input(s): CHOL, HDL, LDLCALC, TRIG, CHOLHDL, LDLDIRECT in the last 72 hours. Thyroid Function Tests No results for input(s): TSH, T4TOTAL, T3FREE, THYROIDAB in the last 72 hours.  Invalid input(s): FREET3  Other results:   Imaging    No results found.   Medications:     Scheduled Medications: . sodium  chloride   Intravenous Once  . amiodarone  200 mg Oral Daily  . amoxicillin  1,000 mg Oral Q12H  . arformoterol  15 mcg Nebulization BID  . atorvastatin  40 mg Oral Daily  . budesonide  0.5 mg Nebulization BID  . chlorhexidine  15 mL Mouth Rinse BID  . clarithromycin  500 mg Oral Q12H  . empagliflozin  10 mg Oral Daily  . feeding supplement (GLUCERNA SHAKE)  237 mL Oral TID BM  . ferrous sulfate  325 mg Oral Q breakfast  . insulin aspart  0-15 Units Subcutaneous TID WC  . insulin aspart  0-5 Units Subcutaneous QHS  . insulin aspart  6 Units Subcutaneous TID WC  . insulin glargine  20 Units Subcutaneous Daily  . mouth rinse  15 mL Mouth Rinse BID  . mouth rinse  15 mL Mouth Rinse q12n4p  . metFORMIN  1,000 mg Oral BID WC  . midodrine  10 mg Oral TID WC  . multivitamin with minerals  1 tablet Oral Daily  . pantoprazole  40 mg Oral BID  . polyethylene glycol  17 g Oral Daily  . revefenacin  175 mcg Nebulization Daily  . saline  1 application Each Nare BID  . sildenafil  40 mg Oral TID  . sodium chloride flush  10-40 mL Intracatheter Q12H    Infusions: . sodium chloride Stopped (09/10/20 1336)  . sodium chloride 10 mL/hr at 09/13/20 0900  . methylPREDNISolone (SOLU-MEDROL) injection 250 mg (09/24/20 0520)    PRN Medications: sodium chloride, acetaminophen, guaiFENesin-dextromethorphan, ondansetron (ZOFRAN) IV, sodium chloride, sodium chloride flush    Assessment/Plan    1. Acute upper GI bleed with symptomatic anemia - hgb dropped to 6.7. Transfused.  - Eliquis off - CT negative for RP bleed - EGD with 2 ulcers not acutely bleeding.  - Hgb 9.3>>8.6 >> 8.5>>8.4>10>>9.3>>8.7  - On protonix PO 40 mg bid  - Off  AC for 2 weeks to allow GI healing. ? Can retry Eliquis this week  - Biopsy back for H pylori. On treatment per GI  2. PAH with cor pulmonale - CT chest 4/22: No PE or ILD - Echo LVEF 60% severe RV dilation and HK - Cath with moderate PAH. PA = 66/31 (46) PCW =  13 Fick = 4.1/2.1 PVR = 7.8 WU - Suspect this is WHO Group 3 PAH due to hypoxic lung disease/OHS/OSA - Will order PFTs (when better compensated) and will need outpatient sleep study - Serologies for completed. Note abnormal ANA, with borderline elevation in chromatin antibody, but all other serologies negative. ANCA pendingESR only mildly abnormal at 44  - On sildenafil 40 mg tid. On midodrine 10 mg three times a day.   - Volume status trending up. Give 40 mg IV lasix x1  - Continue jardiance 10 mg daily.   3. Paroxysmal AF - new  onset. Unclear duration  - Eliquis on hold d/t GI bleed - Was in NSR last week --> back in A fib over the last few days.   - No AC with recent large GI bleed - Continue SCDs.  - Continue PO amio 400 mg daily . Given he keep going back in a fib could consider toprol xl for rate control.  - Not candidate for DC-CV without AC.  - Hopefully can try to resume Eliquis this week. May be candidate for [facility] LAA closure if we can get him stable on [facility] for 1-2 months. Would avoid long-term amio use given underlying lung disease if at all possible.    4. CAD  -mostly non-obstructive - Continue atorvastatin 40 mg daily.  - No ASA with AC - No chest pain.   5. R lung mass - High Res CT- redemonstrated spiculated mass anterior RML with trace left pleural effusion.   6. Shock - Suspect mixed hemorrhagic/cardiogenic (RV failure).   - Resolved  7. Acute hypoxemic respiratory failure with bilateral pleural effusions - Intubated pre-EGD on 4/22.   - Extubated--> on HFNC - Respiratory status remains tenuous. As noted before, degree of hypoxemia far out of proportion to HF or PAH. Continue with aggressive pulmonary toilet and mobilization. - With response to O2 supplementation shunt physiology less likely.  - Now s/p R thora with 1200 cc out. (mostly transudative)  - CXR still with airspace disease  - High Res CT- redemonstrated spiculated mass anterior RML with trace  left pleural effusion.  8. Hyperkalemia - resolved, K 4.0 today   Length of Stay: [address], PA-C  09/24/2020, 7:43 AM  Advanced Heart Failure Team Pager [phone] (M-F; 7a - 5p)  Please contact [facility] for night-coverage after hours (5p -7a ) and weekends on [email or web address]  Patient seen and examined with the above-signed Advanced Practice Provider and/or Housestaff. I personally reviewed laboratory data, imaging studies and relevant notes. I independently examined the patient and formulated the important aspects of the plan. I have edited the note to reflect any of my changes or salient points. I have personally discussed the plan with the patient and/or family.  Remains on HFNC without much improvement despite pulse-dose steroids. Volume status trending up slightly. Received dose of IV lasix this am. Remains in rate-controlled AFL.   General:  Mild dyspnea. Remains on HFNC HEENT: normal Neck: supple. no JVD. Carotids 2+ bilat; no bruits. No lymphadenopathy or thryomegaly appreciated. Cor: PMI nondisplaced. Irregular rate & rhythm. No rubs, gallops or murmurs. Lungs: coarse Abdomen: soft, nontender, nondistended. No hepatosplenomegaly. No bruits or masses. Good bowel sounds. Extremities: no cyanosis, clubbing, rash, edema Neuro: alert & orientedx3, cranial nerves grossly intact. moves all 4 extremities w/o difficulty. Affect pleasant  Case d/w Dr. Carlis Abbott. Oxygen requirements remain out of proportion to other objective findings. Will plan repeat RHC to reassess pulmonary pressures. Consider restarting Eliquis if Hgb remains stable.   Glori Bickers, MD  12:41 PM

## 2020-09-24 NOTE — Interval H&P Note (Signed)
History and Physical Interval Note:  09/24/2020 2:50 PM  Gary [name]  has presented today for surgery, with the diagnosis of heart failure.  The various methods of treatment have been discussed with the patient and family. After consideration of risks, benefits and other options for treatment, the patient has consented to  Procedure(s): RIGHT HEART CATH (N/A) as a surgical intervention.  The patient's history has been reviewed, patient examined, no change in status, stable for surgery.  I have reviewed the patient's chart and labs.  Questions were answered to the patient's satisfaction.     Yousef Huge

## 2020-09-24 NOTE — Progress Notes (Signed)
Gary [name] set for later this afternoon to reassess hemodynamics.   Gary [name] NP_ C 11:14 AM

## 2020-09-24 NOTE — Plan of Care (Signed)
  Problem: Education: Goal: Knowledge of General Education information will improve Description: Including pain rating scale, medication(s)/side effects and non-pharmacologic comfort measures Outcome: Progressing   Problem: Health Behavior/Discharge Planning: Goal: Ability to manage health-related needs will improve Outcome: Progressing   Problem: Clinical Measurements: Goal: Will remain free from infection Outcome: Progressing Goal: Diagnostic test results will improve Outcome: Progressing Goal: Respiratory complications will improve Outcome: Progressing Goal: Cardiovascular complication will be avoided Outcome: Progressing   Problem: Activity: Goal: Risk for activity intolerance will decrease Outcome: Progressing   Problem: Nutrition: Goal: Adequate nutrition will be maintained Outcome: Progressing   Problem: Coping: Goal: Level of anxiety will decrease Outcome: Progressing   Problem: Elimination: Goal: Will not experience complications related to bowel motility Outcome: Progressing Goal: Will not experience complications related to urinary retention Outcome: Progressing   Problem: Pain Managment: Goal: General experience of comfort will improve Outcome: Progressing   Problem: Safety: Goal: Ability to remain free from injury will improve Outcome: Progressing   Problem: Skin Integrity: Goal: Risk for impaired skin integrity will decrease Outcome: Progressing   Problem: Education: Goal: Ability to demonstrate management of disease process will improve Outcome: Progressing Goal: Ability to verbalize understanding of medication therapies will improve Outcome: Progressing Goal: Individualized Educational Video(s) Outcome: Progressing   Problem: Activity: Goal: Capacity to carry out activities will improve Outcome: Progressing   Problem: Cardiac: Goal: Ability to achieve and maintain adequate cardiopulmonary perfusion will improve Outcome: Progressing    Problem: Activity: Goal: Ability to tolerate increased activity will improve Outcome: Progressing   Problem: Respiratory: Goal: Ability to maintain a clear airway and adequate ventilation will improve Outcome: Progressing   Problem: Role Relationship: Goal: Method of communication will improve Outcome: Progressing   Problem: Clinical Measurements: Goal: Ability to maintain clinical measurements within normal limits will improve Outcome: Not Progressing

## 2020-09-24 NOTE — Progress Notes (Signed)
Transported patient on BIPAP back to room.  Placed on HFNC .40/30L.  Tolerated well.

## 2020-09-24 NOTE — Progress Notes (Signed)
PT Cancellation Note  Patient Details Name: Gary [name] MRN: [id number] DOB: 1950-06-20   Cancelled Treatment:    Reason Eval/Treat Not Completed: (P) Patient at procedure or test/unavailable (pt off unit for RHC procedure.) Will continue efforts next date per PT POC as schedule permits.   [name] [name] 09/24/2020, 2:46 PM

## 2020-09-24 NOTE — Progress Notes (Addendum)
Family Medicine Teaching Service Daily Progress Note Intern Pager: [phone]  Patient name: Gary [name] Medical record number: [id number] Date of birth: [birth year] Age: 70 y.o. Gender: male  Primary Care Provider: Encarnacion Slates, PA-C Consultants: Heart Failure, Cardiology, Pulmonology Code Status: FULL  Pt Overview and Major Events to Date:  4/18 admitted 4/19 right/left heart cath with concern for pulmonary HTN 4/22 2u pRBC, intubated, EGD (nonbleeding duodenal ulcers), transferred to ICU 4/26 extubated, 1u pRBC 4/28 FPTS resumed care 5/3: 1200 cc serosanguinous fluid removed via thoracentesis 5/5: HR CT with spiculated mass in RML with hilar adenopathy; started on pulse dose steroids 5/9: Repeat RHC  Assessment and Plan: Gary [name] a 69 y.o.malewhopresentedwith acute hypoxemic respiratory failure 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 PMHx significant for: HTN, T2DM, HLD, depression.  Acute Hypoxemic Respiratory Failure 2/2 unknown etiology  mild PAH with cor pulmonale On 30L at 40% FiO2. Repeat RHC yesterday which showed only very mild PAH and normal to high cardiac output. It appears unlikely that patients high oxygen requirement is cardiac in origin. Thus far, workup has not suggested ILD, auto-immune etiology or cardiac etiology. Malignancy is a consideration given HRCT findings, however, would not expect such an increased oxygen requirement so acutely or improvement with steroids.  Patient is still high risk for bronchoscopy given high O2 requirements,, will continue to wean oxygen as able. -Cardiology following; appreciate recommendations -Try to avoid long-term Amio if possible -Can consider TEE guidedDC-CVif able to tolerate anticoagulation without recurrent bleeding -Pulmonology following; appreciation recommendations -Decrease steroids to 60 mg -Continue  PPI for GI ppx -If more stable: bronch and EBUS for lung mass -Continue Sildenafil 40 mg TID -Continue LABA/LAMA/ICS -Wean O2 as tolerated to maintain satsgreater than 90% -Pulmonary hygiene, mobilization -Can consider repeat CXR again to reassess pleural effusions   Hypocalcemia Ionized calcium 0.99.  -Calcium gluconate 1g/72mL -PTH, Vitamin D level in AM  AKI: Stable, improving Creatinine1.41>1.29>1.24. Baseline appears to be around 0.9.  Paroxysmal Atrial Fibrillation/Atrial Flutter HR have been 100's-110's.  -Cardiology following, appreciate recommendations.   Does patient need IV amio/dilt for rate control? -AC per cardiology -Continue Amiodarone 400 mg daily   [name]: Stable On triple therapy. Hemoglobin has been stable.  Transfusion threshold 8 given CAD. -Continue triple therapy: Amoxicillin 1g BID, Clarithromycin 500 mg BID, Protonix 40 mg BID x14 days(5/2-5/15) -Will need TOCoutpatientafter treatment  Severe Protein Calorie Malnutrition -Continue nutritional supplementation  CAD: Chronic, stable -Atorvastatin 40 mg daily -Can consider restarting ASA if hemoglobin remains stable following Lovenox initiation  Type 2 DM: Chronic, stable CBG's 152-174in last 24 hours. Home medications include Metformin, pioglitazone and Glipizide.  -Monitor CBG's -Lantus to 20 units daily; may need to decrease as steroid dose has decreased -mSSI with HS and mealtime coverage -Continuemetformin 1000 mg BID -Continue10 mg Empagliflozin -HoldhomeGlipizideand pioglitazone  Hypertension with recent Hypotension BP's ranging 95-126/54-78. -Continue Midodrine 10 mg TID  -Monitor BP's  FEN/GI: Heart Healthy PPx: Lovenox    Status is: Inpatient  Remains inpatient appropriate because:Ongoing diagnostic testing needed not appropriate for outpatient work up and Inpatient level of  care appropriate due to severity of illness   Dispo: The patient is from: Home              Anticipated d/c is to: CIR              Patient currently is not medically stable to d/c.   Difficult to  place patient No    Subjective:  Patient feels great this morning.  States he was able to sleep well last night.  He denies any complaints this morning.  Objective: Temp:  [97.9 F (36.6 C)-98.3 F (36.8 C)] 97.9 F (36.6 C) (05/10 0300) Pulse Rate:  [70-121] 120 (05/10 0727) Resp:  [10-26] 21 (05/10 0727) BP: (95-126)/(54-78) 102/55 (05/10 0727) SpO2:  [85 %-100 %] 91 % (05/10 0727) FiO2 (%):  [40 %] 40 % (05/10 0727) Weight:  [81.6 kg] 81.6 kg (05/10 0351) Physical Exam: General: Awake, alert, pleasant, no distress, conversational Cardiovascular: Irregularly irregular rate and rhythm Respiratory: Diminished breath sounds throughout, HFNC 30 L at 40% FiO2, without accessory muscle breathing or respiratory distress Extremities: 1-2+ pitting edema bilateral ankles, Ted compression stockings in place  Laboratory: Recent Labs  Lab 09/21/20 0120 09/22/20 0025 09/24/20 0044 09/24/20 1508 09/24/20 1516  WBC 5.0 10.6* 9.1  --   --   HGB 10.0* 9.3* 8.7* 8.5*  8.8* 8.5*  HCT 31.5* 29.0* 27.0* 25.0*  26.0* 25.0*  PLT 609* 603* 573*  --   --    Recent Labs  Lab 09/18/20 1310 09/19/20 0306 09/23/20 0042 09/24/20 0044 09/24/20 1508 09/24/20 1516 09/25/20 0014  NA  --    < > 131* 132* 139  138 140 133*  K  --    < > 4.3 4.0 3.2*  3.4* 3.2* 4.0  CL  --    < > 93* 95*  --   --  94*  CO2  --    < > 25 26  --   --  28  BUN  --    < > 49* 53*  --   --  51*  CREATININE  --    < > 1.41* 1.29*  --   --  1.24  CALCIUM  --    < > 7.8* 7.8*  --   --  7.8*  PROT 5.7*  --   --   --   --   --   --   GLUCOSE  --    < > 177* 157*  --   --  165*   < > = values in this interval not displayed.    Imaging/Diagnostic Tests: CARDIAC CATHETERIZATION  Result Date: 09/25/2020 Findings: RA =  7 RV = 43/7 PA = 47/23 (32) PCW = 14 Fick cardiac output/index = 10.6/5.4 Thermo CO/CI = 6.3/3.2 PVR = 1.7 (Fick) 2.9 (Thermo) FA sat = 99% PA sat = 78%, 79% High SVC sat = 81% Assessment: 1. Mild pulmonary HTN 2. Normal left-sided filling pressures 3. High cardiac output by Fick (suspect to hyperoxygenation with Bipap). Output normal by thermo 4. No evidence of intracardiac shunting. Plan/Discussion: Very mild PAH. Normal to high cardiac output. No evidence of intracardiac shunting. Cannot exclude peripheral shunting. Arvilla Meres, MD 3:32 PM    Sabino Dick, DO 09/25/2020, 8:28 AM PGY-1, [facility] Family Medicine FPTS Intern pager: [phone], text pages welcome

## 2020-09-24 NOTE — Progress Notes (Signed)
Inpatient Rehab Admissions Coordinator:  Pt noted to be on 30L of O2 via HFNC.  Pt not medically ready for potential CIR admission.  Will continue to follow medical workup and progress with therapies.   Gayland Curry, [place], [place] Admissions Coordinator [phone]

## 2020-09-24 NOTE — Progress Notes (Signed)
NAME:  Gary [name], MRN:  [id number], DOB:  [birth year], LOS: 65 ADMISSION DATE:  09/03/2020, CONSULTATION DATE:  4/22 REFERRING MD:  Nori Riis, CHIEF COMPLAINT:  Dyspnea   History of Present Illness:  70 y/o male admitted with dyspnea and new oxygen requirements.  Found to have pulmonary hypertension. Hospitalization complicated by GI bleeding, went to the ICU and required intubation in setting of hemorrhagic and cardiogenic shock. Treated with dobutuamine and iloprost while intubated.  Extubated after 4 days.  Since extubation pulmonary has been following for persistently elevated oxygen requirements.    Pertinent  Medical History  Former smoker HTN DM Traumatic crush injury from a tree branch with multiple fractures Epistaxis requiring packing   Significant Hospital Events: Including procedures, antibiotic start and stop dates in addition to other pertinent events    4/18 admitted with SOB, echo with elevated right heart pressures, new finding of A. Fib  4/18 CT angiogram chest > small bilateral effusions, patchy infiltrates R lung, RML opacity, no PE  4/19 R/L heart cath with concern for pulmonary hypertension; Mean PA pressure 46, PCWP 13, PVR 7.8 WU  4/20 on 15L HFNC  4/21 hypotensive episode, responded to IVF, down to 5L O2  4/22 ABLA, to ICU, intubated, severe hypoxemia; EGD with nonbleeding duodenal ulcer  4/23 failed veletri wean  4/26 veletri weaned and pt extubated  4/27 transferred Out of ICU  4/29 PCCM re-consulted for recommendations regarding ongoing hypoxia and high oxygen demand. CCM ordering CXR, CTA chest  4/29 CT angiogram > no PE, progresive bilatearl perihilar airspace disease, worse in upper lobes, nodular consolidation in RML, R>L bilateral effusions . 5/3 R thoracentesis> 1.2 L fluid removed, exudate (1/3 light's criteria; pleural fluid LDH is > 2/3 upper limit of normal serum LDH, albumin gradient < 1), lymphocytic predominant . 09/20/2020 initiation of  steroids . 5/5 high-resolution CT chest redemonstrate spiculated mass anterior right middle lobe 3.4x1.7, small trace left pleural effusion; started on pulse-dose steroids . 5/7 down to 15L/ 40% . 5/8 on 22L/ 50%, needing more O2 at night  Interim History / Subjective:  Scheduled for RHC this afternoon at 1330 Afebrile  On HHFNC at 30L/ 50% Remains in afib - controlled on PO amio UOP 1.5L/ 24hrs/ net - 16.3L/ wts up 84 - 84 - 87kg (?accuracy)  Patient reports he is symptomatically feeling better, no SOB, no DOE w/ PT.  Very hungry.  No delirium.  Had setback on Saturday, felt that they weaned his [place] too fast (down to 40%/ 15%).  Unable to tolerate BiPAP- felt smothered, wife reports he is a mouth breather at baseline.   Objective   Blood pressure 105/63, pulse (!) 109, temperature 98.1 F (36.7 C), temperature source Oral, resp. rate 18, height 5' 7"  (1.702 m), weight 87.1 kg, SpO2 94 %.    FiO2 (%):  [50 %-60 %] 50 %   Intake/Output Summary (Last 24 hours) at 09/24/2020 1132 Last data filed at 09/24/2020 0925 Gross per 24 hour  Intake 10 ml  Output 1500 ml  Net -1490 ml   Filed Weights   09/22/20 0339 09/23/20 0500 09/24/20 0410  Weight: 84.1 kg 84.4 kg 87.1 kg    Examination:  General:  Very pleasant, older male sitting in bedside recliner in NAD HEENT: MM pink/moist Neuro: AOx 4, MAE, mildly HOH CV: irir, +2 distal pulses PULM:  Non labored, speaking full sentences on HHFNC without difficulty, clear anteriorly, posterior bibasilar rales, dry cough- sometimes white thin mucous GI: soft,  bs+, NT, hernia (soft) Extremities: warm/dry, +2 LE pitting edema  Skin: no rashes   Wife at bedside   Labs/imaging that I havepersonally reviewed  (right click and "Reselect all SmartList Selections" daily)  Imagine, CT/ HRCT CXR 5/7> persistent RML opacity, no significant reaccumulation of pleural effusion on the right. Relatively unchanged from previous.  CBGs improving sCr  improving 1.53> 1.41> 1.29 WBC 9.1 Hgb 10> 9.3> 8.7  Resolved Hospital Problem list     Assessment & Plan:   Acute respiratory failure with hypoxemia in setting of pulmonary hypertension and bilateral pulmonary infiltrates with R>L pleural effusion R persistent pleural effusion, left effusion resolved. 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 Seems to be responding to steroids over the past day.  CTA PE neg 4/29, 4/22.   Exudative right Effusion (by light's criteria and albumin gradient) cytology was negative. HRCT chest w/ fairly similar appearance as it had on 4/29 w/ patchy GG changes and sm-mod right effusion. Spiculated RML mass, R hilar adenopathy. Autoimmune neg work up and so far the anca ab neg and histone AB neg.  - pulse dose steroids started 5/5, 244m q 6hr  Plan/rec  - will reduce pulse dose steroids to 663mdaily  - await results/ pressures of RHC today  - continue PPI  - wean HHFNC for sat goal > 90% - continue aggressive pulm hygiene IS, flutter, PT/ OOB as tolerated - intermittent CXR, as of 5/7, no significant pleural fluid re-accumulation  - diuresis per HF-> goal euvolemia, s/p lasix today  - continue sildenafil per HF - follow fungal pleural cx - will need to consider further workup, bronch/ EBUS for lung mass as he remains high risk given high  concern for primary lung cancer w/ spiculated RML mass w/ right hilar and mediastinal lymphadenopathy - will need outpt sleep study and PFTs when able  Pulmonary will continue to follow    BrKennieth RadACNP [facility] 09/24/2020, 11:32 AM

## 2020-09-24 NOTE — Progress Notes (Signed)
Placed pt on BIPAP for transport to cath lab.

## 2020-09-25 ENCOUNTER — Inpatient Hospital Stay (HOSPITAL_COMMUNITY): Payer: Medicare Other

## 2020-09-25 ENCOUNTER — Encounter (HOSPITAL_COMMUNITY): Payer: Self-pay | Admitting: Internal Medicine

## 2020-09-25 DIAGNOSIS — I4891 Unspecified atrial fibrillation: Secondary | ICD-10-CM | POA: Diagnosis not present

## 2020-09-25 DIAGNOSIS — E1169 Type 2 diabetes mellitus with other specified complication: Secondary | ICD-10-CM | POA: Diagnosis not present

## 2020-09-25 DIAGNOSIS — I272 Pulmonary hypertension, unspecified: Secondary | ICD-10-CM

## 2020-09-25 DIAGNOSIS — J9601 Acute respiratory failure with hypoxia: Secondary | ICD-10-CM | POA: Diagnosis not present

## 2020-09-25 DIAGNOSIS — I50813 Acute on chronic right heart failure: Secondary | ICD-10-CM | POA: Diagnosis not present

## 2020-09-25 LAB — BASIC METABOLIC PANEL
Anion gap: 11 (ref 5–15)
BUN: 51 mg/dL — ABNORMAL HIGH (ref 8–23)
CO2: 28 mmol/L (ref 22–32)
Calcium: 7.8 mg/dL — ABNORMAL LOW (ref 8.9–10.3)
Chloride: 94 mmol/L — ABNORMAL LOW (ref 98–111)
Creatinine, Ser: 1.24 mg/dL (ref 0.61–1.24)
GFR, Estimated: >60 mL/min (ref >60)
Glucose, Bld: 165 mg/dL — ABNORMAL HIGH (ref 70–99)
Potassium: 4 mmol/L (ref 3.5–5.1)
Sodium: 133 mmol/L — ABNORMAL LOW (ref 135–145)

## 2020-09-25 LAB — GLUCOSE, CAPILLARY
Glucose-Capillary: 152 mg/dL — ABNORMAL HIGH (ref 70–99)
Glucose-Capillary: 205 mg/dL — ABNORMAL HIGH (ref 70–99)
Glucose-Capillary: 136 mg/dL — ABNORMAL HIGH (ref 70–99)
Glucose-Capillary: 112 mg/dL — ABNORMAL HIGH (ref 70–99)

## 2020-09-25 MED ORDER — METFORMIN HCL 500 MG PO TABS
1000.0000 mg | ORAL_TABLET | Freq: Two times a day (BID) | ORAL | Status: DC
  Administered 2020-09-25 – 2020-10-02 (×15): 1000 mg via ORAL
  Filled 2020-09-25 (×15): qty 2

## 2020-09-25 MED ORDER — CALCIUM GLUCONATE-NACL 1-0.675 GM/50ML-% IV SOLN
1.0000 g | Freq: Once | INTRAVENOUS | Status: AC
  Administered 2020-09-25: 1000 mg via INTRAVENOUS
  Filled 2020-09-25: qty 50

## 2020-09-25 MED ORDER — FERROUS SULFATE 325 (65 FE) MG PO TABS
325.0000 mg | ORAL_TABLET | Freq: Every day | ORAL | Status: DC
  Administered 2020-09-25 – 2020-10-02 (×8): 325 mg via ORAL
  Filled 2020-09-25 (×8): qty 1

## 2020-09-25 MED ORDER — PREDNISONE 50 MG PO TABS
60.0000 mg | ORAL_TABLET | Freq: Every day | ORAL | Status: DC
  Administered 2020-09-25 – 2020-09-26 (×2): 60 mg via ORAL
  Filled 2020-09-25 (×2): qty 1

## 2020-09-25 MED FILL — Lidocaine HCl Local Preservative Free (PF) Inj 1%: INTRAMUSCULAR | Qty: 30 | Status: AC

## 2020-09-25 NOTE — Plan of Care (Signed)
Discussed with patient plan of care for the evening, pain management and possibly sleeping in the chair tonight with some teach back displayed  Problem: Education: Goal: Knowledge of General Education information will improve Description: Including pain rating scale, medication(s)/side effects and non-pharmacologic comfort measures Outcome: Progressing

## 2020-09-25 NOTE — Progress Notes (Signed)
  Echocardiogram 2D Echocardiogram limited bubble study has been performed.  Gary [name] 09/25/2020, 4:30 PM

## 2020-09-25 NOTE — Progress Notes (Addendum)
Family Medicine Teaching Service Daily Progress Note Intern Pager: [phone]  Patient name: Gary [name] Medical record number: [id number] Date of birth: July 17, 1950 Age: 70 y.o. Gender: male  Primary Care Provider: Jalene Mullet, PA-C Consultants: Cardiology, Heart Failure, Pulmonology  Code Status: FULL  Pt Overview and Major Events to Date:  4/18 admitted 4/19 right/left heart cath with concern for pulmonary HTN 4/22 2u pRBC, intubated, EGD (nonbleeding duodenal ulcers), transferred to ICU 4/26 extubated, 1u pRBC 4/28 FPTS resumed care 5/3: 1200 cc serosanguinous fluid removed via thoracentesis 5/5: HR CT with spiculated mass in RML with hilar adenopathy; started on pulse dose steroids 5/9: Repeat RHC 5/10: Echo with bubble study   Assessment and Plan: Gary [name] a 70 y.o.malewhopresentedwith acute hypoxemic respiratory failure 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 PMHx significant for: HTN, T2DM, HLD, depression.  Acute Hypoxemic Respiratory Failure 2/2 unknown etiology  mild PAH with cor pulmonale Remained at 30L 40% FiO2 yesterday. Used CPAP at night and felt well with this. No right to left shunting was appreciated on echo yesterday but right chambers were moderately to severely dilated and RV systolic function severely decreased.  -Cardiology following; appreciate recommendations -Try to avoid long-term Amio if possible -Can consider TEE guidedDC-CVif able to tolerate anticoagulation without recurrent bleeding -Pulmonology following; appreciation recommendations -Steroids to 60 mg; taper over 1-week -Continue PPI for GI ppx -If more stable: bronch and EBUS for lung mass -Continue Sildenafil 40 mg TID -Continue LABA/LAMA/ICS -Wean O2 as tolerated to maintain satsgreater than 90% -Pulmonary  hygiene, mobilization -Can consider repeat CXR again to reassess pleural effusions  -Continue CPAP nightly   Hypocalcemia Ionized calcium 0.99. PTH pending, Vitamin D WNL.  -TUMS BID  AKI: Stable, improving Creatinine1.29>1.24>1.0. Baseline appears to be around 0.9.  Paroxysmal Atrial Fibrillation/Atrial Flutter HR have been 90-110's.  -Cardiology following, appreciate recommendations.  -Will plan to start Eliquis tomorrow (Already received Lovenox dose today) at reduced dose given GIB this admission and with stable but borderline Hgb  -Continue Amiodarone 400 mg daily   [name]: Stable On triple therapy. Lovenox started yesterday, hemoglobin stable at 8.2.  Transfusion threshold 8 given CAD. -Continue triple therapy: Amoxicillin 1g BID, Clarithromycin 500 mg BID, Protonix 40 mg BID x14 days(5/2-5/15) -Will need TOCoutpatientafter treatment  Severe Protein Calorie Malnutrition -Continue nutritional supplementation  CAD: Chronic, stable -Atorvastatin 40 mg daily -Can consider restarting ASA if hemoglobin remains stable following Lovenox initiation  Type 2 DM: Chronic, stable CBG's 112-205in last 24 hours. Home medications include Metformin, pioglitazone and Glipizide.  -Monitor CBG's -Decrease Lantusto 15units daily -mSSI with HS  -D/c mealtime coverage  -Continuemetformin 1000 mg BID -Continue10 mg Empagliflozin -HoldhomeGlipizideand pioglitazone  Hypertension with recent Hypotension BP's ranging96-115/55-81. -Continue Midodrine 10 mg TID  -Monitor BP's  FEN/GI:Heart Healthy PPx: Lovenox; transition to Eliquis 2.5 mg BID tomorrow  Status is: Inpatient  Remains inpatient appropriate because:Ongoing diagnostic testing needed not appropriate for outpatient work up and Inpatient level of care appropriate due to severity of illness   Dispo: The patient is from: Home              Anticipated d/c is to: Home               Patient currently is not medically stable to d/c.   Difficult to place patient No   Subjective:  Patient states that he "slept like a baby" last night with the CPAP.  Notes that  was the first time he has slept well since being here.  Also notes that he coughed up brown sputum this morning.  States this was the first time that has happened.  He felt that his nose was clogged.  He felt better after it came out.  Objective: Temp:  [97.5 F (36.4 C)-98.7 F (37.1 C)] 98.4 F (36.9 C) (05/11 0729) Pulse Rate:  [90-115] 115 (05/11 0729) Resp:  [15-20] 20 (05/11 0729) BP: (90-115)/(55-81) 103/71 (05/11 0803) SpO2:  [91 %-97 %] 91 % (05/11 0729) FiO2 (%):  [40 %] 40 % (05/11 0803) Weight:  [83.9 kg] 83.9 kg (05/11 0647) Physical Exam: General: Awake, alert, pleasant, sitting up eating breakfast, in no distress Cardiovascular: Irregularly irregular rate and rhythm, HR 100's-120's on monitor  Respiratory: Decreased air movement bilateral bases, without wheezes/rhochi/rales, on HFNC 30L at 40% FiO2 Abdomen: obese abdomen Extremities: 1+ pitting edema b/l ankles   Laboratory: Recent Labs  Lab 09/22/20 0025 09/24/20 0044 09/24/20 1508 09/24/20 1516 09/26/20 0052  WBC 10.6* 9.1  --   --  12.2*  HGB 9.3* 8.7* 8.5*  8.8* 8.5* 8.2*  HCT 29.0* 27.0* 25.0*  26.0* 25.0* 26.2*  PLT 603* 573*  --   --  453*   Recent Labs  Lab 09/24/20 0044 09/24/20 1508 09/24/20 1516 09/25/20 0014 09/26/20 0052  NA 132*   < > 140 133* 131*  K 4.0   < > 3.2* 4.0 3.5  CL 95*  --   --  94* 95*  CO2 26  --   --  28 26  BUN 53*  --   --  51* 44*  CREATININE 1.29*  --   --  1.24 1.00  CALCIUM 7.8*  --   --  7.8* 7.6*  GLUCOSE 157*  --   --  165* 130*   < > = values in this interval not displayed.    Imaging/Diagnostic Tests: ECHOCARDIOGRAM LIMITED BUBBLE STUDY  Result Date: 09/25/2020    ECHOCARDIOGRAM LIMITED REPORT   Patient Name:   Gary [name] Date of Exam: 09/25/2020 Medical Rec #:  [id number]    Height:       67.0 in Accession #:    [id number]  Weight:       179.9 lb Date of Birth:  Feb 14, 1951   BSA:          1.933 m Patient Age:    70 years    BP:           96/81 mmHg Patient Gender: M           HR:           110 bpm. Exam Location:  Inpatient Procedure: Limited Echo and Saline Contrast Bubble Study Indications:    PAH (pulmonary artery hypertension)  History:        Patient has prior history of Echocardiogram examinations, most                 recent 09/03/2020. Arrythmias:Atrial Fibrillation, RBBB and                 Atrial Flutter; Risk Factors:Dyslipidemia.  Sonographer:    Clayton Lefort RDCS (AE) Referring Phys: [id number] DANIEL R BENSIMHON  FINDINGS  IAS/Shunts: Agitated saline contrast was given intravenously to evaluate for intracardiac shunting. There is no evidence of right to left shunting at rest or with the Valsalva maneuver. The right heart chambers are moderately-to-severely dilated and right ventricular systolic function is severely decreased. Mihai Croitoru  MD Electronically signed by Sanda Klein MD Signature Date/Time: 09/25/2020/5:15:55 PM    Final      Sharion Settler, DO 09/26/2020, 8:50 AM PGY-1, [place] Intern pager: [phone], text pages welcome

## 2020-09-25 NOTE — Progress Notes (Addendum)
Physical Therapy Treatment Patient Details Name: Gary [name] MRN: [id number] DOB: Mar 21, 1951 Today's Date: 09/26/2020    History of Present Illness Patient is a 70 y/o male who presents on 09/03/20 with progressive SOB. Found to have acute respiratory failure, likely due to new onset of heart failure and A-fib with RVR. CXR with R-side effusion, prominent interstitial edema/disease. S/p cardiac cath 4/19. Pt with hypotension 4/21 and drop in hgb on 4/22. Workup for GIB with intubation and urgent bedside EGD on 4/22 with PT sign off. Extubated on 4/26. PT reordered on 4/27. Cardiac cath 5/9 with no significant findings. Weaned off HHFNC to 5L HFNC on 5/11. PMH includes HTN, DM.   PT Comments    Pt progressing with mobiltiy. Today's session focused on multiple bouts of seated and standing activity, with and without UE support. Pt with LOB during standing activity without RW requiring assist to prevent fall. Pt motivated to participate and regain PLOF; remains limited by decreased activity tolerance, poor balance strategies, impaired cardiorespiratory status. Continue to recommend intensive CIR-level therapies to maximize functional mobility and independence prior to return home pending progress and O2 needs.  SpO2 down to 79% on 30L O2 HHFNC at 40% FiO2 with standing  Return to >/88% with seated rest and pursed lip breathing    Follow Up Recommendations  CIR;Supervision for mobility/OOB     Equipment Recommendations   (TBD - RW vs. rollator)    Recommendations for Other Services       Precautions / Restrictions Precautions Precautions: Fall;Other (comment) Precaution Comments: Watch SpO2 (quick to desaturate on 6L O2 [place] with activity) Restrictions Weight Bearing Restrictions: No    Mobility  Bed Mobility               General bed mobility comments: Received sitting in recliner    Transfers Overall transfer level: Needs assistance Equipment used: 4-wheeled walker Transfers:  Sit to/from Stand Sit to Stand: Supervision         General transfer comment: Multiple sit<>stands from recliner and chair to rollator, supervision for safety; initial educ for locking rollator brakes, good carryover for subsequent trials  Ambulation/Gait Ambulation/Gait assistance: Min guard Gait Distance (Feet): 18 Feet (+24'+18'+20') Assistive device: 4-wheeled walker Gait Pattern/deviations: Step-through pattern;Decreased stride length;Trunk flexed Gait velocity: Decreased Gait velocity interpretation: <1.31 ft/sec, indicative of household ambulator General Gait Details: Very slow, guarded gait with rollator and intermittent min guard for balance; pt ambulating short distances (18' + 24' on 6L O2, then 18' + 20' on 8L O2) with prolonged seated rest breaks between ambulation bouts to recover SOB and fatigue; pt with improving awareness for pursed lip breathing, intermittent verbal cues for activity pacing. SpO2 down to 84% on 8L O2 [place] with return to >/88% once seated   Stairs             Wheelchair Mobility    Modified Rankin (Stroke Patients Only)       Balance Overall balance assessment: Needs assistance Sitting-balance support: No upper extremity supported;Feet supported Sitting balance-Leahy Scale: Good       Standing balance-Leahy Scale: Fair Standing balance comment: Can static stand without UE support; static and dynamic stability improved with rollator                            Cognition Arousal/Alertness: Awake/alert Behavior During Therapy: WFL for tasks assessed/performed;Anxious Overall Cognitive Status: Within Functional Limits for tasks assessed  General Comments: Some anxiety related to SOB with mobility; responds well to cues for pursed lip breathing (good recall from prior sessions) and encouragement      Exercises Other Exercises Other Exercises: Encouraged more frequent seated LE  therex - LAQ, seated march, ankle/heel raises    General Comments General comments (skin integrity, edema, etc.): SpO2 down to 81% on 6L O2 [place] with ambulation, HR up to 130s; additional gait trials on 8L with SpO2 down ot 84% with ambulation, HR 120. Pt's wife present and supportive      Pertinent Vitals/Pain Pain Assessment: No/denies pain Pain Intervention(s): Monitored during session    Home Living                      Prior Function            PT Goals (current goals can now be found in the care plan section) Progress towards PT goals: Progressing toward goals    Frequency    Min 3X/week      PT Plan Current plan remains appropriate    Co-evaluation              AM-PAC PT "6 Clicks" Mobility   Outcome Measure  Help needed turning from your back to your side while in a flat bed without using bedrails?: None Help needed moving from lying on your back to sitting on the side of a flat bed without using bedrails?: A Little Help needed moving to and from a bed to a chair (including a wheelchair)?: A Little Help needed standing up from a chair using your arms (e.g., wheelchair or bedside chair)?: A Little Help needed to walk in hospital room?: A Little Help needed climbing 3-5 steps with a railing? : A Lot 6 Click Score: 18    End of Session Equipment Utilized During Treatment: Gait belt;Oxygen Activity Tolerance: Patient tolerated treatment well Patient left: in chair;with call bell/phone within reach;with family/visitor present Nurse Communication: Mobility status PT Visit Diagnosis: Muscle weakness (generalized) (M62.81);Difficulty in walking, not elsewhere classified (R26.2);Other (comment)     Time: 1638-4536 PT Time Calculation (min) (ACUTE ONLY): 39 min  Charges:  $Gait Training: 8-22 mins $Therapeutic Exercise: 8-22 mins $Therapeutic Activity: 8-22 mins                     Mabeline Caras, PT, DPT Acute Rehabilitation Services  Pager  [phone] Office [place] 09/26/2020, 5:01 PM

## 2020-09-25 NOTE — Progress Notes (Addendum)
Patient ID: Gary [name], male   DOB: 02-27-1951, 70 y.o.   MRN: [id number]     Advanced Heart Failure Rounding Note  PCP-Cardiologist: Elouise Munroe, MD   Subjective:   Events  4/22  EGD 4/22 showed 2 ulcers in duodenum that were not actively bleeding.  On PO Protonix, Hgb 10>9.3>8.6  4/23 Intubated. Dobutamine stopped due to marked tachycardia. Started antibiotics for pneumonia. Blood CX- NGTD. Sputum with strep.  4/24 Did not tolerate fast veletri wean.  4/25 Veletri stopped. Extubated. Given 1u PRBC  5/1 Amio gtt stopped>>PO 400 mg daily  5/3 Right thoracentesis with 1200cc out (mostly transudative) 5/5 In A fib . High Res CT- redemonstrated spiculated mass anterior RML with trace left pleural effusion.  5/5 Started on pulse-dose steroids 5/9 RHC mild pulmonary htn, normal left sided filling pressures, high cardiac output.   On HFNC 30 liters FiO2 40% today.    No complaints. Working with OT.   Objective:   Weight Range: 81.6 kg Body mass index is 28.18 kg/m.   Vital Signs:   Temp:  [97.9 F (36.6 C)-98.3 F (36.8 C)] 97.9 F (36.6 C) (05/10 0300) Pulse Rate:  [70-121] 120 (05/10 0727) Resp:  [10-26] 21 (05/10 0727) BP: (95-126)/(54-78) 102/55 (05/10 0727) SpO2:  [85 %-100 %] 92 % (05/10 0835) FiO2 (%):  [40 %] 40 % (05/10 0835) Weight:  [81.6 kg] 81.6 kg (05/10 0351) Last BM Date: 09/23/20  Weight change: Filed Weights   09/24/20 0410 09/24/20 1358 09/25/20 0351  Weight: 87.1 kg 81.6 kg 81.6 kg    Intake/Output:   Intake/Output Summary (Last 24 hours) at 09/25/2020 0939 Last data filed at 09/25/2020 0500 Gross per 24 hour  Intake 2226.38 ml  Output 2800 ml  Net -573.62 ml      Physical Exam  General:  Sitting in the chair. No resp difficulty HEENT: normal Neck: supple. no JVD. Carotids 2+ bilat; no bruits. No lymphadenopathy or thryomegaly appreciated. Cor: PMI nondisplaced. Irregular rate & rhythm. No rubs, gallops or murmurs. Lungs: clear on HFNC   Abdomen: soft, nontender, nondistended. No hepatosplenomegaly. No bruits or masses. Good bowel sounds. Extremities: no cyanosis, clubbing, rash, edema Neuro: alert & orientedx3, cranial nerves grossly intact. moves all 4 extremities w/o difficulty. Affect pleasant   Telemetry    A fib 90-100s   Labs    CBC Recent Labs    09/24/20 0044 09/24/20 1508 09/24/20 1516  WBC 9.1  --   --   HGB 8.7* 8.5*  8.8* 8.5*  HCT 27.0* 25.0*  26.0* 25.0*  MCV 90.3  --   --   PLT 573*  --   --    Basic Metabolic Panel Recent Labs    09/24/20 0044 09/24/20 1508 09/24/20 1516 09/25/20 0014  NA 132*   < > 140 133*  K 4.0   < > 3.2* 4.0  CL 95*  --   --  94*  CO2 26  --   --  28  GLUCOSE 157*  --   --  165*  BUN 53*  --   --  51*  CREATININE 1.29*  --   --  1.24  CALCIUM 7.8*  --   --  7.8*   < > = values in this interval not displayed.   Liver Function Tests No results for input(s): AST, ALT, ALKPHOS, BILITOT, PROT, ALBUMIN in the last 72 hours. No results for input(s): LIPASE, AMYLASE in the last 72 hours. Cardiac Enzymes No results  for input(s): CKTOTAL, CKMB, CKMBINDEX, TROPONINI in the last 72 hours.  BNP: BNP (last 3 results) Recent Labs    09/03/20 1433 09/20/20 0047  BNP 1,202.7* 360.6*    ProBNP (last 3 results) No results for input(s): PROBNP in the last 8760 hours.   D-Dimer No results for input(s): DDIMER in the last 72 hours. Hemoglobin A1C No results for input(s): HGBA1C in the last 72 hours. Fasting Lipid Panel No results for input(s): CHOL, HDL, LDLCALC, TRIG, CHOLHDL, LDLDIRECT in the last 72 hours. Thyroid Function Tests No results for input(s): TSH, T4TOTAL, T3FREE, THYROIDAB in the last 72 hours.  Invalid input(s): FREET3  Other results:   Imaging    CARDIAC CATHETERIZATION  Result Date: 09/25/2020 Findings: RA = 7 RV = 43/7 PA = 47/23 (32) PCW = 14 Fick cardiac output/index = 10.6/5.4 Thermo CO/CI = 6.3/3.2 PVR = 1.7 (Fick) 2.9 (Thermo)  FA sat = 99% PA sat = 78%, 79% High SVC sat = 81% Assessment: 1. Mild pulmonary HTN 2. Normal left-sided filling pressures 3. High cardiac output by Fick (suspect to hyperoxygenation with Bipap). Output normal by thermo 4. No evidence of intracardiac shunting. Plan/Discussion: Very mild PAH. Normal to high cardiac output. No evidence of intracardiac shunting. Cannot exclude peripheral shunting. Glori Bickers, MD 3:32 PM    Medications:     Scheduled Medications: . sodium chloride   Intravenous Once  . amiodarone  200 mg Oral Daily  . amoxicillin  1,000 mg Oral Q12H  . arformoterol  15 mcg Nebulization BID  . atorvastatin  40 mg Oral Daily  . budesonide  0.5 mg Nebulization BID  . chlorhexidine  15 mL Mouth Rinse BID  . clarithromycin  500 mg Oral Q12H  . empagliflozin  10 mg Oral Daily  . enoxaparin (LOVENOX) injection  40 mg Subcutaneous Q24H  . feeding supplement (GLUCERNA SHAKE)  237 mL Oral TID BM  . ferrous sulfate  325 mg Oral Q breakfast  . insulin aspart  0-15 Units Subcutaneous TID WC  . insulin aspart  0-5 Units Subcutaneous QHS  . insulin aspart  6 Units Subcutaneous TID WC  . insulin glargine  20 Units Subcutaneous Daily  . mouth rinse  15 mL Mouth Rinse q12n4p  . metFORMIN  1,000 mg Oral BID WC  . midodrine  10 mg Oral TID WC  . multivitamin with minerals  1 tablet Oral Daily  . pantoprazole  40 mg Oral BID  . polyethylene glycol  17 g Oral Daily  . predniSONE  60 mg Oral Q breakfast  . revefenacin  175 mcg Nebulization Daily  . saline  1 application Each Nare BID  . sildenafil  40 mg Oral TID  . sodium chloride flush  10-40 mL Intracatheter Q12H  . sodium chloride flush  3 mL Intravenous Q12H    Infusions: . sodium chloride Stopped (09/10/20 1336)  . sodium chloride Stopped (09/24/20 0700)    PRN Medications: sodium chloride, acetaminophen, guaiFENesin-dextromethorphan, ondansetron (ZOFRAN) IV, sodium chloride    Assessment/Plan    1. Acute upper GI  bleed with symptomatic anemia - hgb dropped to 6.7. Transfused.  - Eliquis off - CT negative for RP bleed - EGD with 2 ulcers not acutely bleeding.  - CBC pending.  - On protonix PO 40 mg bid  - Off  AC for 2 weeks to allow GI healing. ? Can retry Eliquis this week  - Biopsy back for H pylori. On treatment per GI  2. [place] with  cor pulmonale - CT chest 4/22: No PE or ILD - Echo LVEF 60% severe RV dilation and HK - Cath with moderate PAH. PA = 66/31 (46) PCW = 13 Fick = 4.1/2.1 PVR = 7.8 WU - Suspect this is WHO Group 3 PAH due to hypoxic lung disease/OHS/OSA - Will order PFTs (when better compensated) and will need outpatient sleep study - Serologies for completed. Note abnormal ANA, with borderline elevation in chromatin antibody, but all other serologies negative. ANCA pendingESR only mildly abnormal at 44  - RHC PVR 1.7  - Continue sildenafil 40 mg tid. On midodrine 10 mg three times a day.   - Volume status stable.  - Continue jardiance 10 mg daily.   3. Paroxysmal AF - new onset. Unclear duration  - Eliquis on hold d/t GI bleed - Was in NSR last week --> back in A fib over the last few days.   - No AC with recent large GI bleed - Continue SCDs.  - Continue PO amio 400 mg daily . Given he keep going back in a fib could consider toprol xl for rate control.  - Not candidate for DC-CV without AC.  - Hopefully can try to resume Eliquis this week. May be candidate for [place] LAA closure if we can get him stable on [facility] for 1-2 months. Would avoid long-term amio use given underlying lung disease if at all possible.    4. CAD  -mostly non-obstructive - Continue atorvastatin 40 mg daily.  - No ASA with AC - No chest pain.   5. R lung mass - High Res CT- redemonstrated spiculated mass anterior RML with trace left pleural effusion.   6. Shock - Suspect mixed hemorrhagic/cardiogenic (RV failure).   - Resolved  7. Acute hypoxemic respiratory failure with bilateral pleural  effusions - Intubated pre-EGD on 4/22.   - Extubated--> on HFNC-- very slow wean. On 30 L + 40%  - Respiratory status remains tenuous. As noted before, degree of hypoxemia far out of proportion to HF or PAH. Continue with aggressive pulmonary toilet and mobilization. - With response to O2 supplementation shunt physiology less likely.  - Now s/p R thora with 1200 cc out. (mostly transudative)  - CXR still with airspace disease  - High Res CT- redemonstrated spiculated mass anterior RML with trace left pleural effusion. - Will need PFTs as an outpatient  - Steroids per PCCM.   8. Hyperkalemia - resolved, K 4.0 today   Length of Stay: [place], NP  09/25/2020, 9:39 AM  Advanced Heart Failure Team Pager [phone] (M-F; 7a - 5p)  Please contact [facility] for night-coverage after hours (5p -7a ) and weekends on [email or web address]   Patient seen and examined with the above-signed Advanced Practice Provider and/or Housestaff. I personally reviewed laboratory data, imaging studies and relevant notes. I independently examined the patient and formulated the important aspects of the plan. I have edited the note to reflect any of my changes or salient points. I have personally discussed the plan with the patient and/or family.  RHC done yesterday with only mild PAH and low PCWP. Remains on HF O2 without change. No benefit from steroids. Remains in AFL.   General:  On HF [place] HEENT: normal Neck: supple. no JVD. Carotids 2+ bilat; no bruits. No lymphadenopathy or thryomegaly appreciated. Cor: PMI nondisplaced. Irregular. No rubs, gallops or murmurs. Lungs: clear decreased at bases Abdomen: obese soft, nontender, nondistended. No hepatosplenomegaly. No bruits or masses. Good bowel sounds.  Extremities: no cyanosis, clubbing, rash, edema Neuro: alert & orientedx3, cranial nerves grossly intact. moves all 4 extremities w/o difficulty. Affect pleasant  Case d/w Dr. Lamonte Sakai in detail this am. We both  remain uncertain as to why O2 requirements remain so high. Ne easily reversible cause at this time. We discussed possible PVOD but would suspect pulmonary pressures to be higher.  Will check echo with bubble study to exclude large peripheral shunt.  Glori Bickers, MD  12:39 PM

## 2020-09-25 NOTE — TOC Progression Note (Signed)
Transition of Care [facility]) - Progression Note    Patient Details  Name: Dominic Mahaney MRN: [id number] Date of Birth: [birth year]  Transition of [facility]) CM/SW Contact  Zenon Mayo, RN Phone Number: 09/25/2020, 2:27 PM  Clinical Narrative:    Patient continues on HFNC 30 liters , CIR is following but can not accept patients until they are on 6 liters or less.         Expected Discharge Plan and Services                                                 Social Determinants of Health (SDOH) Interventions Food Insecurity Interventions: Intervention Not Indicated Financial Strain Interventions: Intervention Not Indicated Housing Interventions: Intervention Not Indicated Transportation Interventions: Intervention Not Indicated  Readmission Risk Interventions No flowsheet data found.

## 2020-09-25 NOTE — Progress Notes (Signed)
Nutrition Follow-up  DOCUMENTATION CODES:   Not applicable  INTERVENTION:   -Magic cup BID with meals, each supplement provides 290 kcal and 9 grams of protein -Glucerna Shake po TID, each supplement provides 220 kcal and 10 grams of protein -MVI with minerals daily  NUTRITION DIAGNOSIS:   Inadequate oral intake related to inability to eat as evidenced by NPO status.  Diet advanced   GOAL:   Patient will meet greater than or equal to 90% of their needs  Meeting   MONITOR:   PO intake,Supplement acceptance,Labs,Weight trends,Skin,I & O's  REASON FOR ASSESSMENT:   Ventilator    ASSESSMENT:   70 year old with history of hypertension, dyslipidemia, and type 2 diabetes presenting with acute hypoxemic respiratory failure likely due to new heart failure and atrial fibrillation with rapid ventricular rates.  4/22 EGD: no active bleeding, large duodenal ulcer, erosive gastritis; Intubated 4/24 TF Protocol initiated 4/26 Extubated 5/2- s/p BSE- advanced to regular texture diet with thin liquids 5/9- repeat RHC   Patient reports appetite is stable. Last eight meal completions charted as 100%. Taking Glucerna twice daily. RD reinforced importance of protein intake.   Plan d/c to CIR.   Admission weight: 86.3 kg  Current weight: 81.6 kg   UOP: 2800 ml x 24 hrs   Medications: SS novolog, lantus, metformin, miralax, prednisone Labs: Na 133 (L) CBG 152-205  Diet Order:   Diet Order            Diet Heart Room service appropriate? Yes; Fluid consistency: Thin  Diet effective now                 EDUCATION NEEDS:   Education needs have been addressed  Skin:  Skin Assessment: Reviewed RN Assessment  Last BM:  5/8  Height:   Ht Readings from Last 1 Encounters:  09/03/20 5' 7"  (1.702 m)    Weight:   Wt Readings from Last 1 Encounters:  09/25/20 81.6 kg    Ideal Body Weight:  67.3 kg  BMI:  Body mass index is 28.18 kg/m.  Estimated Nutritional Needs:    Kcal:  2000-2200 kcals  Protein:  100-115 g  Fluid:  > 2 L  Mariana Single RD, LDN Clinical Nutrition Pager listed in [place]

## 2020-09-25 NOTE — Progress Notes (Signed)
NAME:  Gary [name], MRN:  [id number], DOB:  Mar 29, 1951, LOS: 21 ADMISSION DATE:  09/03/2020, CONSULTATION DATE:  4/22 REFERRING MD:  Nori Riis, CHIEF COMPLAINT:  Dyspnea   History of Present Illness:  70 y/o male admitted with dyspnea and new oxygen requirements.  Found to have pulmonary hypertension. Hospitalization complicated by GI bleeding, went to the ICU and required intubation in setting of hemorrhagic and cardiogenic shock. Treated with dobutuamine and iloprost while intubated.  Extubated after 4 days.  Since extubation pulmonary has been following for persistently elevated oxygen requirements.    Pertinent  Medical History  Former smoker HTN DM Traumatic crush injury from a tree branch with multiple fractures Epistaxis requiring packing   Significant Hospital Events: Including procedures, antibiotic start and stop dates in addition to other pertinent events    4/18 admitted with SOB, echo with elevated right heart pressures, new finding of A. Fib  4/18 CT angiogram chest > small bilateral effusions, patchy infiltrates R lung, RML opacity, no PE  4/19 R/L heart cath with concern for pulmonary hypertension; Mean PA pressure 46, PCWP 13, PVR 7.8 WU  4/20 on 15L HFNC  4/21 hypotensive episode, responded to IVF, down to 5L O2  4/22 ABLA, to ICU, intubated, severe hypoxemia; EGD with nonbleeding duodenal ulcer  4/23 failed veletri wean  4/26 veletri weaned and pt extubated  4/27 transferred Out of ICU  4/29 PCCM re-consulted for recommendations regarding ongoing hypoxia and high oxygen demand. CCM ordering CXR, CTA chest  4/29 CT angiogram > no PE, progresive bilatearl perihilar airspace disease, worse in upper lobes, nodular consolidation in RML, R>L bilateral effusions . 5/3 R thoracentesis> 1.2 L fluid removed, exudate (1/3 light's criteria; pleural fluid LDH is > 2/3 upper limit of normal serum LDH, albumin gradient < 1), lymphocytic predominant . 09/20/2020 initiation of  steroids . 5/5 high-resolution CT chest redemonstrate spiculated mass anterior right middle lobe 3.4x1.7, small trace left pleural effusion; started on pulse-dose steroids . 5/7 down to 15L/ 40% . 5/8 on 22L/ 50%, needing more O2 at night  Interim History / Subjective:   R heart cath noted from 5/10 I/O -22 L total He is not wearing CPAP / BiPAP at night On amiodarone Started weaning his steroids yesterday 5/9  O2 needs up and down > 0.40 + 30L/min  Objective   Blood pressure (!) 102/55, pulse (!) 120, temperature 97.9 F (36.6 C), temperature source Oral, resp. rate (!) 21, height 5' 7"  (1.702 m), weight 81.6 kg, SpO2 91 %.    FiO2 (%):  [40 %] 40 %   Intake/Output Summary (Last 24 hours) at 09/25/2020 0809 Last data filed at 09/25/2020 0500 Gross per 24 hour  Intake 2236.38 ml  Output 2800 ml  Net -563.62 ml   Filed Weights   09/24/20 0410 09/24/20 1358 09/25/20 0351  Weight: 87.1 kg 81.6 kg 81.6 kg    Examination:  General:  Elderly man, up to chair HEENT: Op clear, strong voice, no stridor Neuro: A&O, decreased hearing,  HQ:PRFFM irreg, distant PULM:  Decreased, B basilar insp crackles.  GI: non distended, + BS Extremities: !+ LE edema Skin: no rash   Labs/imaging that I havepersonally reviewed  (right click and "Reselect all SmartList Selections" daily)   Reviewed all imaging R heart cath data reviewed Renal fxn trend reviewed   Resolved Hospital Problem list     Assessment & Plan:   Acute respiratory failure with hypoxemia in setting of pulmonary hypertension and bilateral pulmonary infiltrates  with R>L pleural effusion R persistent pleural effusion, left effusion resolved. 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  He has had improvement in his pulmonary infiltrates with aggressive diuresis, also improved PASP, mean PAP with diuresis and the addition of sildenafil.  His PVR does not support adding targeted PAH meds.  No evidence of thromboembolic disease.  We have treated him with corticosteroids in the event that some of his interstitial infiltrates were inflammatory (autoimmune work-up unrevealing).  He is also on therapy for presumed obstructive lung disease (will need PFT as an outpatient).  He has a residual right middle lobe opacity that is masslike in appearance, concerning for possible primary lung malignancy although no evidence for lymphangitic process that would contribute to hypoxemia on imaging.  Plan/rec -Changes corticosteroids to prednisone 60 mg once daily on 5/10.  Plan to taper over about 1 week. -Continue diuresis as blood pressure, renal function can tolerate -Wean his high flow nasal cannula.  May need to tolerate some degree of hypoxemia in order to accomplish -Continue scheduled sildenafil -Needs an outpatient sleep study, PFTs when able to consider -Evaluation of his right middle lobe opacity on hold for now given his oxygen needs.  He would be able to tolerate bronchoscopy, EBUS which would require general anesthesia.  Would be very challenging to get him off mechanical ventilation   Baltazar Apo, MD, PhD 09/25/2020, 8:32 AM [facility] [phone] or if no answer before 7:00PM call [phone] For any issues after 7:00PM please call eLink [phone]

## 2020-09-25 NOTE — Progress Notes (Signed)
Occupational Therapy Treatment Patient Details Name: Gary [name] MRN: [id number] DOB: 29-Jul-1950 Today's Date: 09/25/2020    History of present illness Patient is a 70 y/o male who presents on 09/03/20 with progressive SOB. Found to have acute respiratory failure likely due to new onset of heart failure and A-fib with RVR. CXR- Rt effusion, prominent interstitial edema/disease. s/p cardiac cath 09/04/20. 4/21 hypotension, 4/22 drop in Hgb, GIB with intubation and urgent bedside EGD on 4/22 with PT sign off.  Pt was extubated on 4/26. PT reordered on 4/27. Cardiac cath 5/9 with no significant findings. PMhx: HTN and DM.   OT comments  Pt making good functional progress toward goals on care plan but continues to be significantly limited by his respiratory status. Pt on 30 L High Flow O2 and O2 drops to 80% and HR to 128 with tasks in standing.  Reviewed pursed lip breathing and need to take breaks when talking. Pt drops saturation levels when talking and attempting to do therapy at the same time.  Pt very motivated and is improving with adl abilities. Spoke to pt about boosting in his chair and occasionally calling nursing in to stand to relieve pressure due to pt preferring to sit all day in chair.     Follow Up Recommendations  CIR    Equipment Recommendations  3 in 1 bedside commode;Tub/shower bench    Recommendations for Other Services      Precautions / Restrictions Precautions Precautions: Fall Precaution Comments: watch 02 (on high flow), HR Restrictions Weight Bearing Restrictions: No       Mobility Bed Mobility               General bed mobility comments: Pt up  in chair on arrival.    Transfers Overall transfer level: Needs assistance Equipment used: Rolling walker (2 wheeled) Transfers: Sit to/from Stand Sit to Stand: Min guard Stand pivot transfers: Min guard       General transfer comment: min guard given due to fatigue.    Balance Overall balance  assessment: Needs assistance Sitting-balance support: No upper extremity supported;Feet supported Sitting balance-Leahy Scale: Good Sitting balance - Comments: sitting on edge of chair unsupported with no UE support and no LOB   Standing balance support: Bilateral upper extremity supported;During functional activity Standing balance-Leahy Scale: Poor Standing balance comment: Pt heavily reliant on walker for balance and for endurance. Walker taken away for standing task and HR up to 130 with O2 sats down to 80%. Pt recovered after sitting for appx 1 minute.                           ADL either performed or assessed with clinical judgement   ADL Overall ADL's : Needs assistance/impaired     Grooming: Wash/dry face;Wash/dry hands;Oral care;Set up;Sitting   Upper Body Bathing: Set up;Sitting       Upper Body Dressing : Minimal assistance;Sitting   Lower Body Dressing: Minimal assistance;Sit to/from stand;Cueing for compensatory techniques Lower Body Dressing Details (indicate cue type and reason): Pt requires assist to fully leg go of walker when pulling pants up.  Cues given to let go one hand at a time. Toilet Transfer: Chief of Staff Details (indicate cue type and reason): simulated via functional mobility Toileting- Clothing Manipulation and Hygiene: Supervision/safety;Sitting/lateral lean       Functional mobility during ADLs: Min guard;Rolling walker ([place] limits how far pt can walk as does dropping O2 sats.) General ADL  Comments: Pt on 30 L HHFNC and continues to struggle to keep O2 sats up.  Instructed pt on pursed lip breathing.  Tasks in standing are difficulty with O2 sats down to 82% at times.  Pt does recover to 90% with proper breathing.     Vision   Vision Assessment?: No apparent visual deficits   Perception     Praxis      Cognition Arousal/Alertness: Awake/alert Behavior During Therapy: WFL for tasks  assessed/performed Overall Cognitive Status: Within Functional Limits for tasks assessed                                 General Comments: pleasant, participatory        Exercises Other Exercises Other Exercises: chair push-ups with BUE and BLE support x5 reps Other Exercises: ROM and reaching tasks with B shoulders. Other Exercises: marching in place for 45 sec at a time with walker.   Shoulder Instructions       General Comments Pt physically is able to do adls and transfers. Pt is significantly limited by his O2 status.    Pertinent Vitals/ Pain       Pain Assessment: Faces Faces Pain Scale: Hurts a little bit Pain Location: rt knee Pain Descriptors / Indicators: Aching;Sore Pain Intervention(s): Monitored during session  Home Living                                          Prior Functioning/Environment              Frequency  Min 2X/week        Progress Toward Goals  OT Goals(current goals can now be found in the care plan section)  Progress towards OT goals: Progressing toward goals  Acute Rehab OT Goals Patient Stated Goal: to get to CIR and get stronger before going home OT Goal Formulation: With patient Time For Goal Achievement: 09/27/20 Potential to Achieve Goals: Good ADL Goals Pt Will Perform Grooming: with set-up;sitting;standing Pt Will Perform Upper Body Bathing: with set-up;sitting Pt Will Perform Lower Body Bathing: with min assist;sit to/from stand Pt Will Perform Upper Body Dressing: with set-up;sitting Pt Will Perform Lower Body Dressing: with min assist;with adaptive equipment;sit to/from stand Pt Will Transfer to Toilet: with min guard assist;stand pivot transfer;bedside commode Pt Will Perform Toileting - Clothing Manipulation and hygiene: with min guard assist;sit to/from stand  Plan Discharge plan remains appropriate;Frequency remains appropriate    Co-evaluation                 AM-PAC OT  "6 Clicks" Daily Activity     Outcome Measure   Help from another person eating meals?: None Help from another person taking care of personal grooming?: None Help from another person toileting, which includes using toliet, bedpan, or urinal?: A Little Help from another person bathing (including washing, rinsing, drying)?: A Little Help from another person to put on and taking off regular upper body clothing?: A Little Help from another person to put on and taking off regular lower body clothing?: A Little 6 Click Score: 20    End of Session Equipment Utilized During Treatment: Rolling walker;Oxygen;Other (comment) (30 L HHFNC)  OT Visit Diagnosis: Unsteadiness on feet (R26.81);Muscle weakness (generalized) (M62.81);Pain Pain - Right/Left: Right Pain - part of body: Knee   Activity Tolerance Patient  limited by fatigue   Patient Left in chair;with call bell/phone within reach;with nursing/sitter in room   Nurse Communication Mobility status        Time: 4996-9249 OT Time Calculation (min): 30 min  Charges: OT General Charges $OT Visit: 1 Visit OT Treatments $Self Care/Home Management : 23-37 mins   Glenford Peers 09/25/2020, 10:21 AM

## 2020-09-25 NOTE — Progress Notes (Signed)
Inpatient Rehab Admissions Coordinator:   Pt. Is not medically ready for CIR at this time. Note that Pt. Continues on 30 L of oxygen via HFNC and was noted to desaturate to 80% Spo2 during last therapy session. CIR will continue to follow at a distance, but CIR cannot accept patients on more than 6L. Please contact me with any questions.   Clemens Catholic, [place], [place] Admissions Coordinator  [phone] ([place]) [phone] (office)

## 2020-09-26 DIAGNOSIS — I4891 Unspecified atrial fibrillation: Secondary | ICD-10-CM | POA: Diagnosis not present

## 2020-09-26 DIAGNOSIS — J9601 Acute respiratory failure with hypoxia: Secondary | ICD-10-CM | POA: Diagnosis not present

## 2020-09-26 DIAGNOSIS — E1169 Type 2 diabetes mellitus with other specified complication: Secondary | ICD-10-CM | POA: Diagnosis not present

## 2020-09-26 DIAGNOSIS — I50813 Acute on chronic right heart failure: Secondary | ICD-10-CM | POA: Diagnosis not present

## 2020-09-26 LAB — CBC
HCT: 26.2 % — ABNORMAL LOW (ref 39.0–52.0)
Hemoglobin: 8.2 g/dL — ABNORMAL LOW (ref 13.0–17.0)
MCH: 28.1 pg (ref 26.0–34.0)
MCHC: 31.3 g/dL (ref 30.0–36.0)
MCV: 89.7 fL (ref 80.0–100.0)
Platelets: 453 10*3/uL — ABNORMAL HIGH (ref 150–400)
RBC: 2.92 MIL/uL — ABNORMAL LOW (ref 4.22–5.81)
RDW: 15 % (ref 11.5–15.5)
WBC: 12.2 10*3/uL — ABNORMAL HIGH (ref 4.0–10.5)
nRBC: 0 % (ref 0.0–0.2)

## 2020-09-26 LAB — BASIC METABOLIC PANEL
Anion gap: 10 (ref 5–15)
BUN: 44 mg/dL — ABNORMAL HIGH (ref 8–23)
CO2: 26 mmol/L (ref 22–32)
Calcium: 7.6 mg/dL — ABNORMAL LOW (ref 8.9–10.3)
Chloride: 95 mmol/L — ABNORMAL LOW (ref 98–111)
Creatinine, Ser: 1 mg/dL (ref 0.61–1.24)
GFR, Estimated: >60 mL/min (ref >60)
Glucose, Bld: 130 mg/dL — ABNORMAL HIGH (ref 70–99)
Potassium: 3.5 mmol/L (ref 3.5–5.1)
Sodium: 131 mmol/L — ABNORMAL LOW (ref 135–145)

## 2020-09-26 LAB — GLUCOSE, CAPILLARY
Glucose-Capillary: 114 mg/dL — ABNORMAL HIGH (ref 70–99)
Glucose-Capillary: 207 mg/dL — ABNORMAL HIGH (ref 70–99)
Glucose-Capillary: 160 mg/dL — ABNORMAL HIGH (ref 70–99)
Glucose-Capillary: 163 mg/dL — ABNORMAL HIGH (ref 70–99)

## 2020-09-26 LAB — VITAMIN D 25 HYDROXY (VIT D DEFICIENCY, FRACTURES): Vit D, 25-Hydroxy: 43.71 ng/mL (ref 30–100)

## 2020-09-26 MED ORDER — CALCIUM CARBONATE ANTACID 500 MG PO CHEW
1.0000 | CHEWABLE_TABLET | Freq: Two times a day (BID) | ORAL | Status: DC
  Administered 2020-09-26 – 2020-09-27 (×3): 200 mg via ORAL
  Filled 2020-09-26 (×3): qty 1

## 2020-09-26 MED ORDER — APIXABAN 2.5 MG PO TABS: 2.5000 mg | ORAL_TABLET | Freq: Two times a day (BID) | ORAL | Status: DC

## 2020-09-26 MED ORDER — INSULIN GLARGINE 100 UNIT/ML ~~LOC~~ SOLN
15.0000 [IU] | Freq: Every day | SUBCUTANEOUS | Status: DC
  Administered 2020-09-26 – 2020-09-27 (×2): 15 [IU] via SUBCUTANEOUS
  Filled 2020-09-26 (×2): qty 0.15

## 2020-09-26 MED ORDER — PREDNISONE 50 MG PO TABS
50.0000 mg | ORAL_TABLET | Freq: Every day | ORAL | Status: AC
  Administered 2020-09-27: 50 mg via ORAL
  Filled 2020-09-26: qty 1

## 2020-09-26 MED ORDER — PREDNISONE 20 MG PO TABS
40.0000 mg | ORAL_TABLET | Freq: Every day | ORAL | Status: AC
  Administered 2020-09-28: 40 mg via ORAL
  Filled 2020-09-26: qty 2

## 2020-09-26 MED ORDER — PREDNISONE 10 MG PO TABS
5.0000 mg | ORAL_TABLET | Freq: Every day | ORAL | Status: AC
  Administered 2020-10-02: 5 mg via ORAL
  Filled 2020-09-26: qty 1

## 2020-09-26 MED ORDER — PREDNISONE 20 MG PO TABS
20.0000 mg | ORAL_TABLET | Freq: Every day | ORAL | Status: AC
  Administered 2020-09-30: 20 mg via ORAL
  Filled 2020-09-26: qty 1

## 2020-09-26 MED ORDER — TORSEMIDE 20 MG PO TABS
20.0000 mg | ORAL_TABLET | Freq: Every day | ORAL | Status: DC
  Administered 2020-09-26 – 2020-10-02 (×7): 20 mg via ORAL
  Filled 2020-09-26 (×7): qty 1

## 2020-09-26 MED ORDER — PREDNISONE 20 MG PO TABS
30.0000 mg | ORAL_TABLET | Freq: Every day | ORAL | Status: AC
  Administered 2020-09-29: 30 mg via ORAL
  Filled 2020-09-26: qty 1

## 2020-09-26 MED ORDER — PREDNISONE 10 MG PO TABS
10.0000 mg | ORAL_TABLET | Freq: Every day | ORAL | Status: AC
  Administered 2020-10-01: 10 mg via ORAL
  Filled 2020-09-26: qty 1

## 2020-09-26 NOTE — Progress Notes (Signed)
Patient ID: Gary [name], male   DOB: 1951-05-11, 70 y.o.   MRN: [id number]     Advanced Heart Failure Rounding Note  PCP-Cardiologist: Elouise Munroe, MD   Subjective:   Events  4/22  EGD 4/22 showed 2 ulcers in duodenum that were not actively bleeding.  On PO Protonix, Hgb 10>9.3>8.6  4/23 Intubated. Dobutamine stopped due to marked tachycardia. Started antibiotics for pneumonia. Blood CX- NGTD. Sputum with strep.  4/24 Did not tolerate fast veletri wean.  4/25 Veletri stopped. Extubated. Given 1u PRBC  5/1 Amio gtt stopped>>PO 400 mg daily  5/3 Right thoracentesis with 1200cc out (mostly transudative) 5/5 In A fib . High Res CT- redemonstrated spiculated mass anterior RML with trace left pleural effusion.  5/5 Started on pulse-dose steroids 5/9 RHC mild pulmonary htn, normal left sided filling pressures, high cardiac output.   Remains on HF [place].   Bubble study yesterday negative.    Objective:   Weight Range: 83.9 kg Body mass index is 28.97 kg/m.   Vital Signs:   Temp:  [97.5 F (36.4 C)-98.7 F (37.1 C)] 98.4 F (36.9 C) (05/11 0729) Pulse Rate:  [90-115] 115 (05/11 0729) Resp:  [15-20] 20 (05/11 0729) BP: (90-115)/(55-81) 103/71 (05/11 0803) SpO2:  [91 %-97 %] 91 % (05/11 0729) FiO2 (%):  [40 %] 40 % (05/11 0803) Weight:  [83.9 kg] 83.9 kg (05/11 0647) Last BM Date: 09/25/20  Weight change: Filed Weights   09/24/20 1358 09/25/20 0351 09/26/20 0647  Weight: 81.6 kg 81.6 kg 83.9 kg    Intake/Output:   Intake/Output Summary (Last 24 hours) at 09/26/2020 0948 Last data filed at 09/26/2020 0645 Gross per 24 hour  Intake 300 ml  Output 1425 ml  Net -1125 ml      Physical Exam   General:  Sitting in chair on HFNC HEENT: normal Neck: supple. no JVD. Carotids 2+ bilat; no bruits. No lymphadenopathy or thryomegaly appreciated. Cor: PMI nondisplaced. Irregular rate & rhythm. No rubs, gallops or murmurs. Lungs: coarse Abdomen: obese soft, nontender,  nondistended. No hepatosplenomegaly. No bruits or masses. Good bowel sounds. Extremities: no cyanosis, clubbing, rash, edema Neuro: alert & orientedx3, cranial nerves grossly intact. moves all 4 extremities w/o difficulty. Affect pleasant  Telemetry    A fib 100-110s Personally reviewed  Labs    CBC Recent Labs    09/24/20 0044 09/24/20 1508 09/24/20 1516 09/26/20 0052  WBC 9.1  --   --  12.2*  HGB 8.7*   < > 8.5* 8.2*  HCT 27.0*   < > 25.0* 26.2*  MCV 90.3  --   --  89.7  PLT 573*  --   --  453*   < > = values in this interval not displayed.   Basic Metabolic Panel Recent Labs    09/25/20 0014 09/26/20 0052  NA 133* 131*  K 4.0 3.5  CL 94* 95*  CO2 28 26  GLUCOSE 165* 130*  BUN 51* 44*  CREATININE 1.24 1.00  CALCIUM 7.8* 7.6*   Liver Function Tests No results for input(s): AST, ALT, ALKPHOS, BILITOT, PROT, ALBUMIN in the last 72 hours. No results for input(s): LIPASE, AMYLASE in the last 72 hours. Cardiac Enzymes No results for input(s): CKTOTAL, CKMB, CKMBINDEX, TROPONINI in the last 72 hours.  BNP: BNP (last 3 results) Recent Labs    09/03/20 1433 09/20/20 0047  BNP 1,202.7* 360.6*    ProBNP (last 3 results) No results for input(s): PROBNP in the last 8760 hours.  D-Dimer No results for input(s): DDIMER in the last 72 hours. Hemoglobin A1C No results for input(s): HGBA1C in the last 72 hours. Fasting Lipid Panel No results for input(s): CHOL, HDL, LDLCALC, TRIG, CHOLHDL, LDLDIRECT in the last 72 hours. Thyroid Function Tests No results for input(s): TSH, T4TOTAL, T3FREE, THYROIDAB in the last 72 hours.  Invalid input(s): FREET3  Other results:   Imaging    ECHOCARDIOGRAM LIMITED BUBBLE STUDY  Result Date: 09/25/2020    ECHOCARDIOGRAM LIMITED REPORT   Patient Name:   BRETT DARKO Date of Exam: 09/25/2020 Medical Rec #:  [id number]   Height:       67.0 in Accession #:    [id number]  Weight:       179.9 lb Date of Birth:  1950-10-09   BSA:           1.933 m Patient Age:    87 years    BP:           96/81 mmHg Patient Gender: M           HR:           110 bpm. Exam Location:  Inpatient Procedure: Limited Echo and Saline Contrast Bubble Study Indications:    PAH (pulmonary artery hypertension)  History:        Patient has prior history of Echocardiogram examinations, most                 recent 09/03/2020. Arrythmias:Atrial Fibrillation, RBBB and                 Atrial Flutter; Risk Factors:Dyslipidemia.  Sonographer:    Clayton Lefort RDCS (AE) Referring Phys: [id number] Timohty Renbarger R Lilliane Sposito  FINDINGS  IAS/Shunts: Agitated saline contrast was given intravenously to evaluate for intracardiac shunting. There is no evidence of right to left shunting at rest or with the Valsalva maneuver. The right heart chambers are moderately-to-severely dilated and right ventricular systolic function is severely decreased. Sanda Klein MD Electronically signed by Sanda Klein MD Signature Date/Time: 09/25/2020/5:15:55 PM    Final      Medications:     Scheduled Medications: . sodium chloride   Intravenous Once  . amiodarone  200 mg Oral Daily  . amoxicillin  1,000 mg Oral Q12H  . arformoterol  15 mcg Nebulization BID  . atorvastatin  40 mg Oral Daily  . budesonide  0.5 mg Nebulization BID  . calcium carbonate  1 tablet Oral BID WC  . chlorhexidine  15 mL Mouth Rinse BID  . clarithromycin  500 mg Oral Q12H  . empagliflozin  10 mg Oral Daily  . enoxaparin (LOVENOX) injection  40 mg Subcutaneous Q24H  . feeding supplement (GLUCERNA SHAKE)  237 mL Oral TID BM  . ferrous sulfate  325 mg Oral Q breakfast  . insulin aspart  0-15 Units Subcutaneous TID WC  . insulin aspart  0-5 Units Subcutaneous QHS  . insulin aspart  6 Units Subcutaneous TID WC  . insulin glargine  15 Units Subcutaneous Daily  . mouth rinse  15 mL Mouth Rinse q12n4p  . metFORMIN  1,000 mg Oral BID WC  . midodrine  10 mg Oral TID WC  . multivitamin with minerals  1 tablet Oral Daily  .  pantoprazole  40 mg Oral BID  . polyethylene glycol  17 g Oral Daily  . predniSONE  60 mg Oral Q breakfast  . revefenacin  175 mcg Nebulization Daily  . saline  1 application Each Nare BID  .  sildenafil  40 mg Oral TID  . sodium chloride flush  10-40 mL Intracatheter Q12H  . sodium chloride flush  3 mL Intravenous Q12H    Infusions: . sodium chloride Stopped (09/10/20 1336)  . sodium chloride Stopped (09/24/20 0700)    PRN Medications: sodium chloride, acetaminophen, guaiFENesin-dextromethorphan, ondansetron (ZOFRAN) IV, sodium chloride    Assessment/Plan    1. Acute upper GI bleed with symptomatic anemia - hgb dropped to 6.7. Transfused.  - Eliquis off - CT negative for RP bleed - EGD with 2 ulcers not acutely bleeding.  - CBC pending.  - On protonix PO 40 mg bid  - Off  AC for 2 weeks to allow GI healing - Biopsy back for H pylori. On treatment per GI - hgb stable at 8.2. Would add Eliquis back   2. PAH with cor pulmonale - CT chest 4/22: No PE or ILD - Echo LVEF 60% severe RV dilation and HK - Cath with moderate PAH. PA = 66/31 (46) PCW = 13 Fick = 4.1/2.1 PVR = 7.8 WU - Suspect this is WHO Group 3 PAH due to hypoxic lung disease/OHS/OSA - Will order PFTs (when better compensated) and will need outpatient sleep study - Serologies for completed. Note abnormal ANA, with borderline elevation in chromatin antibody, but all other serologies negative. ANCA pendingESR only mildly abnormal at 44 - repeat RHC 09/24/20 PA 47/23 (32) PCW 14 - Continue sildenafil 40 mg tid. On midodrine 10 mg three times a day.   - Volume status stable.  - Continue jardiance 10 mg daily.   3. Paroxysmal AF - new onset. Unclear duration  - Eliquis on hold d/t GI bleed - Was in NSR 2 weeks ago --> back in AF - Off AC with recent large GI bleed - Continue SCDs.  - Continue PO amio 400 mg daily . Given he keep going back in a fib could consider toprol xl for rate control.  - Not candidate for  DC-CV without AC.  - Consider restarting Eliquis this week. May be candidate for Watchman LAA closure if we can get him stable on [facility] for 1-2 months. Would avoid long-term amio use given underlying lung disease if at all possible.    4. CAD  -mostly non-obstructive - Continue atorvastatin 40 mg daily.  - No ASA with AC - No  5. R lung mass - High Res CT- redemonstrated spiculated mass anterior RML with trace left pleural effusion.   6. Shock - Suspect mixed hemorrhagic/cardiogenic (RV failure).   - Resolved  7. Acute hypoxemic respiratory failure with bilateral pleural effusions - Intubated pre-EGD on 4/22.   - Extubated--> on HFNC-- very slow wean. On 30 L + 40%  - Respiratory status remains tenuous. As noted before, degree of hypoxemia far out of proportion to HF or PAH. Continue with aggressive pulmonary toilet and mobilization. - With response to O2 supplementation shunt physiology less likely.  -s/p R thora with 1200 cc out. (mostly transudative)  - High Res CT- redemonstrated spiculated mass anterior RML with trace left pleural effusion. - Will need PFTs as an outpatient  - No improvement with steroids  - Will start torsemide 20 daily to keep dry  Case d/w Dr. Lamonte Sakai in detail yesterday. We both remain uncertain as to why O2 requirements remain so high. Ne easily reversible cause at this time. We discussed possible PVOD but would suspect pulmonary pressures to be higher. Echo without evidence of peripheral shunting.   Length of Stay: 22  Glori Bickers, MD  09/26/2020, 9:48 AM  Advanced Heart Failure Team Pager [phone] (M-F; 7a - 5p)  Please contact [facility] for night-coverage after hours (5p -7a ) and weekends on [email or web address]

## 2020-09-26 NOTE — Progress Notes (Signed)
Pt was taken off of the heated high flow [place] and placed on 5 L high flow [place]. Pt is stable at this time, with appropriate saturations and no distress at this time. RN made aware. RT will monitor.

## 2020-09-26 NOTE — Progress Notes (Signed)
Physical Therapy Treatment Patient Details Name: Gary [name] MRN: [id number] DOB: 1950/07/06 Today's Date: 09/26/2020    History of Present Illness Patient is a 70 y/o male who presents on 09/03/20 with progressive SOB. Found to have acute respiratory failure, likely due to new onset of heart failure and A-fib with RVR. CXR with R-side effusion, prominent interstitial edema/disease. S/p cardiac cath 4/19. Pt with hypotension 4/21 and drop in hgb on 4/22. Workup for GIB with intubation and urgent bedside EGD on 4/22 with PT sign off. Extubated on 4/26. PT reordered on 4/27. Cardiac cath 5/9 with no significant findings. Weaned off HHFNC to 5L HFNC on 5/11. PMH includes HTN, DM.   PT Comments    Pt progressing well with mobility, encouraged to be off heated high flow O2, resting on 5L O2 [place] with SpO2 92-94%. Today's session focused on transfer and gait training with rollator, as well as short ambulation bouts to work on improving activity tolerance and rebuild confidence regarding mobility. Pt with some anxiety related to SOB, but motivated to participate and regain PLOF. Pt remains limited by decreased activity tolerance, generalized weakness and impaired balance. Would benefit from intensive CIR-level therapies to continue to address these deficits in order to maximize functional mobility and independence prior to return home.  SpO2 down to 81% on 6L O2 HFNC with ambulation SpO2 down to 84% on 8L O2 HFNC with ambulation Quick return (~30-60 sec) to >/88% with seated rest and pursed lip breathing    Follow Up Recommendations  CIR;Supervision for mobility/OOB     Equipment Recommendations   (TBD - RW vs. rollator)    Recommendations for Other Services       Precautions / Restrictions Precautions Precautions: Fall;Other (comment) Precaution Comments: Watch SpO2 (quick to desaturate on 6L O2 [place] with activity) Restrictions Weight Bearing Restrictions: No    Mobility  Bed Mobility                General bed mobility comments: Received sitting in recliner    Transfers Overall transfer level: Needs assistance Equipment used: 4-wheeled walker Transfers: Sit to/from Stand Sit to Stand: Supervision         General transfer comment: Multiple sit<>stands from recliner and chair to rollator, supervision for safety; initial educ for locking rollator brakes, good carryover for subsequent trials  Ambulation/Gait Ambulation/Gait assistance: Min guard Gait Distance (Feet): 18 Feet (+24'+18'+20') Assistive device: 4-wheeled walker Gait Pattern/deviations: Step-through pattern;Decreased stride length;Trunk flexed Gait velocity: Decreased Gait velocity interpretation: <1.31 ft/sec, indicative of household ambulator General Gait Details: Very slow, guarded gait with rollator and intermittent min guard for balance; pt ambulating short distances (18' + 24' on 6L O2, then 18' + 20' on 8L O2) with prolonged seated rest breaks between ambulation bouts to recover SOB and fatigue; pt with improving awareness for pursed lip breathing, intermittent verbal cues for activity pacing. SpO2 down to 84% on 8L O2 [place] with return to >/88% once seated   Stairs             Wheelchair Mobility    Modified Rankin (Stroke Patients Only)       Balance Overall balance assessment: Needs assistance Sitting-balance support: No upper extremity supported;Feet supported Sitting balance-Leahy Scale: Good       Standing balance-Leahy Scale: Fair Standing balance comment: Can static stand without UE support; static and dynamic stability improved with rollator  Cognition Arousal/Alertness: Awake/alert Behavior During Therapy: WFL for tasks assessed/performed;Anxious Overall Cognitive Status: Within Functional Limits for tasks assessed                                 General Comments: Some anxiety related to SOB with mobility; responds well  to cues for pursed lip breathing (good recall from prior sessions) and encouragement      Exercises      General Comments General comments (skin integrity, edema, etc.): SpO2 down to 81% on 6L O2 [place] with ambulation, HR up to 130s; additional gait trials on 8L with SpO2 down ot 84% with ambulation, HR 120. Pt's wife present and supportive      Pertinent Vitals/Pain Pain Assessment: No/denies pain Pain Intervention(s): Monitored during session    Home Living                      Prior Function            PT Goals (current goals can now be found in the care plan section) Progress towards PT goals: Progressing toward goals    Frequency    Min 3X/week      PT Plan Current plan remains appropriate    Co-evaluation              AM-PAC PT "6 Clicks" Mobility   Outcome Measure  Help needed turning from your back to your side while in a flat bed without using bedrails?: None Help needed moving from lying on your back to sitting on the side of a flat bed without using bedrails?: A Little Help needed moving to and from a bed to a chair (including a wheelchair)?: A Little Help needed standing up from a chair using your arms (e.g., wheelchair or bedside chair)?: A Little Help needed to walk in hospital room?: A Little Help needed climbing 3-5 steps with a railing? : A Lot 6 Click Score: 18    End of Session Equipment Utilized During Treatment: Gait belt;Oxygen Activity Tolerance: Patient tolerated treatment well Patient left: in chair;with call bell/phone within reach;with family/visitor present Nurse Communication: Mobility status PT Visit Diagnosis: Muscle weakness (generalized) (M62.81);Difficulty in walking, not elsewhere classified (R26.2);Other (comment)     Time: 2229-7989 PT Time Calculation (min) (ACUTE ONLY): 39 min  Charges:  $Gait Training: 8-22 mins $Therapeutic Exercise: 8-22 mins $Therapeutic Activity: 8-22 mins                     Mabeline Caras, PT, DPT Acute Rehabilitation Services  Pager [phone] Office [place] 09/26/2020, 4:53 PM

## 2020-09-26 NOTE — Progress Notes (Signed)
Inpatient Rehab Admissions Coordinator:   Pt. Continues to desat with activity on 30L. Via HFNC. He is not medically for CIR at this time. Will follow for potential admit once medically ready.   Clemens Catholic, [place], [place] Admissions Coordinator  [phone] ([place]) [phone] (office)

## 2020-09-27 DIAGNOSIS — J9601 Acute respiratory failure with hypoxia: Secondary | ICD-10-CM | POA: Diagnosis not present

## 2020-09-27 LAB — GLUCOSE, CAPILLARY
Glucose-Capillary: 82 mg/dL (ref 70–99)
Glucose-Capillary: 262 mg/dL — ABNORMAL HIGH (ref 70–99)
Glucose-Capillary: 170 mg/dL — ABNORMAL HIGH (ref 70–99)
Glucose-Capillary: 138 mg/dL — ABNORMAL HIGH (ref 70–99)

## 2020-09-27 LAB — BASIC METABOLIC PANEL
Anion gap: 12 (ref 5–15)
BUN: 40 mg/dL — ABNORMAL HIGH (ref 8–23)
CO2: 26 mmol/L (ref 22–32)
Calcium: 7.6 mg/dL — ABNORMAL LOW (ref 8.9–10.3)
Chloride: 95 mmol/L — ABNORMAL LOW (ref 98–111)
Creatinine, Ser: 1.17 mg/dL (ref 0.61–1.24)
GFR, Estimated: >60 mL/min (ref >60)
Glucose, Bld: 142 mg/dL — ABNORMAL HIGH (ref 70–99)
Potassium: 3.6 mmol/L (ref 3.5–5.1)
Sodium: 133 mmol/L — ABNORMAL LOW (ref 135–145)

## 2020-09-27 LAB — CBC
HCT: 26.6 % — ABNORMAL LOW (ref 39.0–52.0)
Hemoglobin: 8.5 g/dL — ABNORMAL LOW (ref 13.0–17.0)
MCH: 28.5 pg (ref 26.0–34.0)
MCHC: 32 g/dL (ref 30.0–36.0)
MCV: 89.3 fL (ref 80.0–100.0)
Platelets: 404 10*3/uL — ABNORMAL HIGH (ref 150–400)
RBC: 2.98 MIL/uL — ABNORMAL LOW (ref 4.22–5.81)
RDW: 15.2 % (ref 11.5–15.5)
WBC: 9.5 10*3/uL (ref 4.0–10.5)
nRBC: 0.2 % (ref 0.0–0.2)

## 2020-09-27 LAB — PTH, INTACT AND CALCIUM
Calcium, Total (PTH): 7.7 mg/dL — ABNORMAL LOW (ref 8.6–10.2)
PTH: 21 pg/mL (ref 15–65)

## 2020-09-27 LAB — ALBUMIN: Albumin: 2 g/dL — ABNORMAL LOW (ref 3.5–5.0)

## 2020-09-27 LAB — MAGNESIUM: Magnesium: 1.8 mg/dL (ref 1.7–2.4)

## 2020-09-27 MED ORDER — POTASSIUM CHLORIDE CRYS ER 20 MEQ PO TBCR
40.0000 meq | EXTENDED_RELEASE_TABLET | Freq: Once | ORAL | Status: AC
  Administered 2020-09-27: 40 meq via ORAL
  Filled 2020-09-27: qty 2

## 2020-09-27 MED ORDER — MAGNESIUM SULFATE 2 GM/50ML IV SOLN
2.0000 g | Freq: Once | INTRAVENOUS | Status: AC
  Administered 2020-09-27: 2 g via INTRAVENOUS
  Filled 2020-09-27: qty 50

## 2020-09-27 MED ORDER — DILTIAZEM HCL ER COATED BEADS 120 MG PO CP24
120.0000 mg | ORAL_CAPSULE | Freq: Every day | ORAL | Status: DC
  Administered 2020-09-27 – 2020-10-01 (×5): 120 mg via ORAL
  Filled 2020-09-27 (×5): qty 1

## 2020-09-27 MED ORDER — APIXABAN 5 MG PO TABS
5.0000 mg | ORAL_TABLET | Freq: Two times a day (BID) | ORAL | Status: DC
  Administered 2020-09-27 – 2020-10-02 (×11): 5 mg via ORAL
  Filled 2020-09-27 (×11): qty 1

## 2020-09-27 MED ORDER — INSULIN GLARGINE 100 UNIT/ML ~~LOC~~ SOLN
10.0000 [IU] | Freq: Every day | SUBCUTANEOUS | Status: DC
  Administered 2020-09-28 – 2020-09-29 (×2): 10 [IU] via SUBCUTANEOUS
  Filled 2020-09-27 (×3): qty 0.1

## 2020-09-27 MED ORDER — POLYETHYLENE GLYCOL 3350 17 G PO PACK
17.0000 g | PACK | Freq: Every day | ORAL | Status: DC | PRN
  Administered 2020-10-02: 17 g via ORAL
  Filled 2020-09-27: qty 1

## 2020-09-27 NOTE — Progress Notes (Signed)
Placed patient on CPAP for the night via auto-mode with oxygen set at 5lpm.  

## 2020-09-27 NOTE — Progress Notes (Addendum)
Family Medicine Teaching Service Daily Progress Note Intern Pager: [phone]  Patient name: Gary [name] Medical record number: [id number] Date of birth: 19-Jul-1950 Age: 70 y.o. Gender: male  Primary Care Provider: Jalene Mullet, PA-C Consultants: Cardiology, Pulmonology Code Status: FULL  Pt Overview and Major Events to Date:  4/18 admitted 4/19 right/left heart cath with concern for pulmonary HTN 4/22 2u pRBC, intubated, EGD (nonbleeding duodenal ulcers), transferred to ICU 4/26 extubated, 1u pRBC 4/28 FPTS resumed care 5/3: 1200 cc serosanguinous fluid removed via thoracentesis 5/5: HR CT with spiculated mass in RML with hilar adenopathy; started on pulse dose steroids 5/9: Repeat RHC 5/10: Echo with bubble study  5/11: Weaned to 5L [place]   Assessment and Plan: [name] a 70 y.o.malewhopresentedwith acute hypoxemic respiratory failure 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 PMHx significant for: HTN, T2DM, HLD, depression.  Acute Hypoxemic Respiratory Failure 2/2unknown etiology  mildPAH  Mr. Shukla made significant improvement yesterday, being able to be weaned down to 5L HFNC yesterday and sustained SpO2 >90%. During my encounter he is on 6L [place] and saturated between 83-89%. Appreciate pulmonology and cardiology's care and recommendations for this patient.  -Cardiology following; appreciate recommendations  -Continue Torsemide 20 mg daily  -Pulmonology following; appreciation recommendations -Tapering steroids -Continue PPI for GI ppx -Repeat imaging of right middle lobe opacity once acute issues resolved  -Possible bronchoscopy in the future; would not tolerate now  -Continue Sildenafil 40 mg TID -Continue LABA/LAMA/ICS -Wean O2 as tolerated to maintain satsgreater than 90% -Pulmonary hygiene, mobilization -Continue CPAP  nightly   Atrial Fibrillation with RVR HR 110's-120 during encounter. Mg 1.8, K 3.6.  -Cardiology following, appreciate recommendations.  Start diltiazem 120 mg daily -Eliquis 13m BID -Continue Amiodarone 400 mg daily  -Goal K >4, Mg >2; will replete  Hypoalbuminemia  Ionized calcium 0.99. PTH WNL, total calcium decreased at 7.7, Vitamin D WNL. Albumin 2 this AM, calcium corrects to 9.2 -Continue protein supplementation   AKI: Stable Creatinine1.24>1.0>1.17.Daily Torsemide started yesterday.Baseline appears to be around 0.9. -Monitor with BMP  [name]: Stable On triple therapy.Hemoglobin remains stable. -Continue triple therapy: Amoxicillin 1g BID, Clarithromycin 500 mg BID, Protonix 40 mg BID x14 days(5/2-5/15) -Will need TOCoutpatientafter treatment  Severe Protein Calorie Malnutrition -Continue nutritional supplementation  CAD: Chronic, stable -Atorvastatin 40 mg daily -Holding ASA  Type 2 DM: Chronic, stable CBG's 82-207in last 24 hours. Home medications include Metformin, pioglitazone and Glipizide.  -Monitor CBG's -Decrease Lantus to 10units daily -mSSI with HS  -Continuemetformin 1000 mg BID -Continue10 mg Empagliflozin -HoldhomeGlipizideand pioglitazone  Hypertension with recent Hypotension BP's ranging90-106/49-71. -Continue Midodrine 10 mg TID  -Monitor BP's  FEN/GI:Heart Healthy POIL:[id number] mg BID   Status is: Inpatient  Remains inpatient appropriate because:Ongoing diagnostic testing needed not appropriate for outpatient work up   Dispo: The patient is from: Home              Anticipated d/c is to: Home vs CIR?              Patient currently is not medically stable to d/c.   Difficult to place patient No  Subjective:  Patient feels very well this morning.  He is very ecstatic about his progress.  States that he got up and walked with PT and felt well. As far as eventual disposition, he would be more  inclined to go home with home health. States that he has a friend who can  work with him to do rehab (same person helped after his pelvic fracture).   Objective: Temp:  [97.7 F (36.5 C)-98.7 F (37.1 C)] 98.5 F (36.9 C) (05/12 0329) Pulse Rate:  [92-119] 92 (05/12 0329) Resp:  [13-20] 19 (05/12 0329) BP: (90-106)/(49-71) 105/64 (05/12 0329) SpO2:  [86 %-98 %] 97 % (05/12 0329) FiO2 (%):  [40 %] 40 % (05/11 1432) Weight:  [84.1 kg] 84.1 kg (05/12 0329) Physical Exam: General: Awake, alert, pleasant, in no distress Cardiovascular: Irregularly irregular rate and rhythm Respiratory: Breathing comfortably, speaking in full sentences, on 6 L nasal cannula, decreased breath sounds bilateral bases L>R Abdomen: obese abdomen  Extremities: 2+ pitting edema b/l ankles   Laboratory: Recent Labs  Lab 09/24/20 0044 09/24/20 1508 09/24/20 1516 09/26/20 0052 09/27/20 0048  WBC 9.1  --   --  12.2* 9.5  HGB 8.7*   < > 8.5* 8.2* 8.5*  HCT 27.0*   < > 25.0* 26.2* 26.6*  PLT 573*  --   --  453* 404*   < > = values in this interval not displayed.   Recent Labs  Lab 09/25/20 0014 09/26/20 0052 09/27/20 0048  NA 133* 131* 133*  K 4.0 3.5 3.6  CL 94* 95* 95*  CO2 28 26 26   BUN 51* 44* 40*  CREATININE 1.24 1.00 1.17  CALCIUM 7.8* 7.6*  7.7* 7.6*  GLUCOSE 165* 130* 142*    Imaging/Diagnostic Tests: No results found.   Sharion Settler, DO 09/27/2020, 7:04 AM PGY-1, [place] Intern pager: [phone], text pages welcome

## 2020-09-27 NOTE — Progress Notes (Signed)
   09/27/20 1554  Assess: MEWS Score  Temp 97.9 F (36.6 C)  BP (!) 91/55  Pulse Rate 79  ECG Heart Rate 79  Resp (!) 24  SpO2 92 %  Assess: MEWS Score  MEWS Temp 0  MEWS Systolic 1  MEWS Pulse 0  MEWS RR 1  MEWS LOC 0  MEWS Score 2  MEWS Score Color Yellow  Assess: if the MEWS score is Yellow or Red  Were vital signs taken at a resting state? Yes  Focused Assessment No change from prior assessment  Early Detection of Sepsis Score *See Row Information* Medium  MEWS guidelines implemented *See Row Information* Yes  Take Vital Signs  Increase Vital Sign Frequency  Yellow: Q 2hr X 2 then Q 4hr X 2, if remains yellow, continue Q 4hrs  Escalate  MEWS: Escalate Yellow: discuss with charge nurse/RN and consider discussing with provider and RRT  Notify: Charge Nurse/RN  Name of Charge Nurse/RN Notified april Rn  Date Charge Nurse/RN Notified 09/27/20  Time Charge Nurse/RN Notified 1603  Document  Patient Outcome Stabilized after interventions  Progress note created (see row info) Yes  Assess: SIRS CRITERIA  SIRS Temperature  0  SIRS Pulse 0  SIRS Respirations  1  SIRS WBC 0  SIRS Score Sum  1

## 2020-09-27 NOTE — Progress Notes (Signed)
Occupational Therapy Treatment Patient Details Name: Gary [name] MRN: [id number] DOB: 1951/04/06 Today's Date: 09/27/2020    History of present illness Patient is a 70 y/o male who presents on 09/03/20 with progressive SOB. Found to have acute respiratory failure, likely due to new onset of heart failure and A-fib with RVR. CXR with R-side effusion, prominent interstitial edema/disease. S/p cardiac cath 4/19. Pt with hypotension 4/21 and drop in hgb on 4/22. Workup for GIB with intubation and urgent bedside EGD on 4/22 with PT sign off. Extubated on 4/26. PT reordered on 4/27. Cardiac cath 5/9 with no significant findings. Weaned off HHFNC to 5L HFNC on 5/11. PMH includes HTN, DM.   OT comments  The patient continues to progress toward patient focused OT goals.  He continues to titrate his O2 needs downwards, needing 6L of O2 via HFNC at rest, and up to 8L during activity to maintain sats above 90%.  He did desaturate to 83% on 6L, and needed verbal cues to not talk and PLB to rebound above 90%.  Patient able to mobilize in the room with a 4WRW and Min Guard.  He was able to stand sink side for grooming tasks, but continues to require rest breaks.  OT will continue to follow him and maximize his functional status.  CIR continues to be recommended given his motivation, level of participation, and independence at home.     Follow Up Recommendations  CIR    Equipment Recommendations  3 in 1 bedside commode;Tub/shower bench    Recommendations for Other Services      Precautions / Restrictions Precautions Precautions: Fall;Other (comment) Precaution Comments: Watch SpO2 (quick to desaturate on 6L O2 [place] with activity) Restrictions Weight Bearing Restrictions: No       Mobility Bed Mobility                 Patient Response: Cooperative  Transfers Overall transfer level: Needs assistance Equipment used: 4-wheeled walker Transfers: Sit to/from [employer] Transfers Sit to  Stand: Min guard Stand pivot transfers: Min guard            Balance Overall balance assessment: Needs assistance         Standing balance support: Bilateral upper extremity supported Standing balance-Leahy Scale: Fair Standing balance comment: Can static stand without UE support; static and dynamic stability, leaning against counter at times                           ADL either performed or assessed with clinical judgement   ADL       Grooming: Wash/dry face;Wash/dry hands;Oral care;Set up;Sitting;Standing Grooming Details (indicate cue type and reason): Initially standing with seated rest breaks 4WRW                             Functional mobility during ADLs: Min guard;Rolling walker General ADL Comments: Patient currenlty on 6L HFNC, adjusted to 8L with activity due to desaturation.                       Cognition Arousal/Alertness: Awake/alert Behavior During Therapy: Anxious Overall Cognitive Status: Within Functional Limits for tasks assessed  Pertinent Vitals/ Pain       Pain Assessment: No/denies pain                                                          Frequency  Min 2X/week        Progress Toward Goals  OT Goals(current goals can now be found in the care plan section)  Progress towards OT goals: Progressing toward goals  Acute Rehab OT Goals Patient Stated Goal: Get stronger and return home more independent OT Goal Formulation: With patient Time For Goal Achievement: 10/05/20 Potential to Achieve Goals: Good  Plan Discharge plan remains appropriate    Co-evaluation                 AM-PAC OT "6 Clicks" Daily Activity     Outcome Measure   Help from another person eating meals?: None Help from another person taking care of personal grooming?: None Help from another person toileting, which  includes using toliet, bedpan, or urinal?: A Little Help from another person bathing (including washing, rinsing, drying)?: A Little Help from another person to put on and taking off regular upper body clothing?: A Little Help from another person to put on and taking off regular lower body clothing?: A Little 6 Click Score: 20    End of Session Equipment Utilized During Treatment: Rolling walker;Oxygen  OT Visit Diagnosis: Unsteadiness on feet (R26.81);Muscle weakness (generalized) (M62.81)   Activity Tolerance Patient limited by fatigue   Patient Left in chair;with call bell/phone within reach;with family/visitor present   Nurse Communication          Time: 1646-1700 OT Time Calculation (min): 14 min  Charges: OT General Charges $OT Visit: 1 Visit OT Treatments $Self Care/Home Management : 8-22 mins  09/27/2020  Rich, OTR/L  Acute Rehabilitation Services  Office:  [employer] 09/27/2020, 5:12 PM

## 2020-09-27 NOTE — Progress Notes (Addendum)
Patient ID: Gary [name], male   DOB: 08-22-1950, 70 y.o.   MRN: [id number]     Advanced Heart Failure Rounding Note  PCP-Cardiologist: Elouise Munroe, MD   Subjective:   Events  4/22  EGD 4/22 showed 2 ulcers in duodenum that were not actively bleeding.  On PO Protonix, Hgb 10>9.3>8.6  4/23 Intubated. Dobutamine stopped due to marked tachycardia. Started antibiotics for pneumonia. Blood CX- NGTD. Sputum with strep.  4/24 Did not tolerate fast veletri wean.  4/25 Veletri stopped. Extubated. Given 1u PRBC  5/1 Amio gtt stopped>>PO 400 mg daily  5/3 Right thoracentesis with 1200cc out (mostly transudative) 5/5 In A fib . High Res CT- redemonstrated spiculated mass anterior RML with trace left pleural effusion.  5/5 Started on pulse-dose steroids 5/9 RHC mild pulmonary htn, normal left sided filling pressures, high cardiac output.  5/10 Bubble Study Negative   Currently off HFNC. On 6L [place] w/ O2 sats ~92%. Also reports subjective improvement. Feels better.   Remains in Afib. V-rates low 100s-110s at rest. Eliquis reordered, but per [facility], no doses were given yesterday.   Hgb stable, 8.2>>8.5.    Objective:   Weight Range: 84.1 kg Body mass index is 29.04 kg/m.   Vital Signs:   Temp:  [97.7 F (36.5 C)-98.7 F (37.1 C)] 98.5 F (36.9 C) (05/12 0329) Pulse Rate:  [92-119] 92 (05/12 0329) Resp:  [13-20] 19 (05/12 0329) BP: (90-106)/(49-71) 105/64 (05/12 0329) SpO2:  [86 %-98 %] 97 % (05/12 0329) FiO2 (%):  [40 %] 40 % (05/11 1432) Weight:  [84.1 kg] 84.1 kg (05/12 0329) Last BM Date: 09/25/20  Weight change: Filed Weights   09/25/20 0351 09/26/20 0647 09/27/20 0329  Weight: 81.6 kg 83.9 kg 84.1 kg    Intake/Output:   Intake/Output Summary (Last 24 hours) at 09/27/2020 0711 Last data filed at 09/27/2020 0336 Gross per 24 hour  Intake 840 ml  Output 1150 ml  Net -310 ml      Physical Exam   PHYSICAL EXAM: General:  Well appearing, sitting up in chair, on [place] but no  increased WOB  HEENT: normal Neck: supple. no JVD. Carotids 2+ bilat; no bruits. No lymphadenopathy or thyromegaly appreciated. Cor: PMI nondisplaced. Irreg irregular rhythm. No rubs, gallops or murmurs. Lungs: decreased BS at the bases R>L  Abdomen: soft, nontender, nondistended. No hepatosplenomegaly. No bruits or masses. Good bowel sounds. Extremities: no cyanosis, clubbing, rash, edema Neuro: alert & oriented x 3, cranial nerves grossly intact. moves all 4 extremities w/o difficulty. Affect pleasant.   Telemetry    A fib 100-110s Personally reviewed  Labs    CBC Recent Labs    09/26/20 0052 09/27/20 0048  WBC 12.2* 9.5  HGB 8.2* 8.5*  HCT 26.2* 26.6*  MCV 89.7 89.3  PLT 453* 466*   Basic Metabolic Panel Recent Labs    09/26/20 0052 09/27/20 0048  NA 131* 133*  K 3.5 3.6  CL 95* 95*  CO2 26 26  GLUCOSE 130* 142*  BUN 44* 40*  CREATININE 1.00 1.17  CALCIUM 7.6*  7.7* 7.6*   Liver Function Tests No results for input(s): AST, ALT, ALKPHOS, BILITOT, PROT, ALBUMIN in the last 72 hours. No results for input(s): LIPASE, AMYLASE in the last 72 hours. Cardiac Enzymes No results for input(s): CKTOTAL, CKMB, CKMBINDEX, TROPONINI in the last 72 hours.  BNP: BNP (last 3 results) Recent Labs    09/03/20 1433 09/20/20 0047  BNP 1,202.7* 360.6*    ProBNP (last 3  results) No results for input(s): PROBNP in the last 8760 hours.   D-Dimer No results for input(s): DDIMER in the last 72 hours. Hemoglobin A1C No results for input(s): HGBA1C in the last 72 hours. Fasting Lipid Panel No results for input(s): CHOL, HDL, LDLCALC, TRIG, CHOLHDL, LDLDIRECT in the last 72 hours. Thyroid Function Tests No results for input(s): TSH, T4TOTAL, T3FREE, THYROIDAB in the last 72 hours.  Invalid input(s): FREET3  Other results:   Imaging    No results found.   Medications:     Scheduled Medications: . sodium chloride   Intravenous Once  . amiodarone  200 mg Oral  Daily  . amoxicillin  1,000 mg Oral Q12H  . apixaban  2.5 mg Oral BID  . arformoterol  15 mcg Nebulization BID  . atorvastatin  40 mg Oral Daily  . budesonide  0.5 mg Nebulization BID  . calcium carbonate  1 tablet Oral BID WC  . chlorhexidine  15 mL Mouth Rinse BID  . clarithromycin  500 mg Oral Q12H  . empagliflozin  10 mg Oral Daily  . feeding supplement (GLUCERNA SHAKE)  237 mL Oral TID BM  . ferrous sulfate  325 mg Oral Q breakfast  . insulin aspart  0-15 Units Subcutaneous TID WC  . insulin aspart  0-5 Units Subcutaneous QHS  . insulin glargine  15 Units Subcutaneous Daily  . mouth rinse  15 mL Mouth Rinse q12n4p  . metFORMIN  1,000 mg Oral BID WC  . midodrine  10 mg Oral TID WC  . multivitamin with minerals  1 tablet Oral Daily  . pantoprazole  40 mg Oral BID  . polyethylene glycol  17 g Oral Daily  . [START ON 09/28/2020] predniSONE  40 mg Oral Q breakfast   Followed by  . [START ON 09/29/2020] predniSONE  30 mg Oral Q breakfast   Followed by  . [START ON 09/30/2020] predniSONE  20 mg Oral Q breakfast   Followed by  . [START ON 10/01/2020] predniSONE  10 mg Oral Q breakfast   Followed by  . [START ON 10/02/2020] predniSONE  5 mg Oral Q breakfast  . revefenacin  175 mcg Nebulization Daily  . saline  1 application Each Nare BID  . sildenafil  40 mg Oral TID  . sodium chloride flush  10-40 mL Intracatheter Q12H  . sodium chloride flush  3 mL Intravenous Q12H  . torsemide  20 mg Oral Daily    Infusions: . sodium chloride Stopped (09/10/20 1336)  . sodium chloride Stopped (09/24/20 0700)    PRN Medications: sodium chloride, acetaminophen, guaiFENesin-dextromethorphan, ondansetron (ZOFRAN) IV, sodium chloride    Assessment/Plan    1. Acute upper GI bleed with symptomatic anemia - hgb dropped to 6.7. Transfused.  - Eliquis held  - CT negative for RP bleed - EGD with 2 ulcers not acutely bleeding.  - On protonix PO 40 mg bid  - Off  AC for 2 weeks to allow GI  healing - Biopsy back for H pylori. On treatment per GI - hgb stable at 8.5. Restart Eliquis today and monitor H/H  2. PAH with cor pulmonale - CT chest 4/22: No PE or ILD - Echo LVEF 60% severe RV dilation and HK - Cath with moderate PAH. PA = 66/31 (46) PCW = 13 Fick = 4.1/2.1 PVR = 7.8 WU - Suspect this is WHO Group 3 PAH due to hypoxic lung disease/OHS/OSA - Will order PFTs (when better compensated) and will need outpatient sleep  study - Serologies for completed. Note abnormal ANA, with borderline elevation in chromatin antibody, but all other serologies negative. ANCA pendingESR only mildly abnormal at 44 - repeat RHC 09/24/20 PA 47/23 (32) PCW 14 - Continue sildenafil 40 mg tid. On midodrine 10 mg three times a day.   - Volume status stable.  - Continue jardiance 10 mg daily.   3. Paroxysmal AF - new onset. Unclear duration  - Eliquis on hold d/t GI bleed - Was in NSR 2 weeks ago --> back in AF - Off AC with recent large GI bleed - Continue SCDs.  - Continue PO amio 400 mg daily . Given he keep going back in a fib could consider toprol xl for rate control.  - Not candidate for DC-CV without AC.  - Will restart Eliquis today. May be candidate for [facility] LAA closure if we can get him stable on [facility] for 1-2 months. Would avoid long-term amio use given underlying lung disease if at all possible.    4. CAD  -mostly non-obstructive - Continue atorvastatin 40 mg daily.  - No ASA with AC - No CP   5. R lung mass - High Res CT- redemonstrated spiculated mass anterior RML with trace left pleural effusion.   6. Shock - Suspect mixed hemorrhagic/cardiogenic (RV failure).   - Resolved  7. Acute hypoxemic respiratory failure with bilateral pleural effusions - Intubated pre-EGD on 4/22.   - Extubated--> on HFNC-- very slow wean. On 30 L + 40%  - Respiratory status remains tenuous. As noted before, degree of hypoxemia far out of proportion to HF or PAH. Continue with aggressive  pulmonary toilet and mobilization. - With response to O2 supplementation shunt physiology less likely.  -s/p R thora with 1200 cc out. (mostly transudative)  - High Res CT- redemonstrated spiculated mass anterior RML with trace left pleural effusion. - RHC 5/9 w/ mild pulmonary htn, normal left sided filling pressures, high cardiac output. - ? PVOD but would suspect pulmonary pressures to be higher - Echo w/o evidence of peripheral shunting  - Will need PFTs as an outpatient  - Gradual improvement with steroids  - Continue torsemide 20 daily to keep dry  Length of Stay: [address] PA-C  09/27/2020, 7:11 AM  Advanced Heart Failure Team Pager [phone] (M-F; 7a - 5p)  Please contact [facility] for night-coverage after hours (5p -7a ) and weekends on [email or web address]  Patient seen and examined with the above-signed Advanced Practice Provider and/or Housestaff. I personally reviewed laboratory data, imaging studies and relevant notes. I independently examined the patient and formulated the important aspects of the plan. I have edited the note to reflect any of my changes or salient points. I have personally discussed the plan with the patient and/or family.  Feeling better this am. Now of HFNC. Sats low 90s. Remains in AF. Rates elevated. Weight stable on low dose torsemide.   General:  Sitting up in chair.  HEENT: normal Neck: supple. no JVD. Carotids 2+ bilat; no bruits. No lymphadenopathy or thryomegaly appreciated. Cor: PMI nondisplaced. Irregular tachy Lungs: coarse Abdomen: obese soft, nontender, nondistended. No hepatosplenomegaly. No bruits or masses. Good bowel sounds. Extremities: no cyanosis, clubbing, rash, edema Neuro: alert & orientedx3, cranial nerves grossly intact. moves all 4 extremities w/o difficulty. Affect pleasant  Respiratory status improved but still tenuous. Remains in AF with RVR. Wil add low-dose cardizem to help with rate control. Restart Eliquis  (should be on 5 bid not 2.5 bid) Follow  hgb closely.  Glori Bickers, MD  8:58 AM

## 2020-09-27 NOTE — Progress Notes (Signed)
NAME:  Gary [name], MRN:  [id number], DOB:  Aug 22, 1950, LOS: 110 ADMISSION DATE:  09/03/2020, CONSULTATION DATE:  4/22 REFERRING MD:  Nori Riis, CHIEF COMPLAINT:  Dyspnea   History of Present Illness:  70 y/o male admitted with dyspnea and new oxygen requirements.  Found to have pulmonary hypertension. Hospitalization complicated by GI bleeding, went to the ICU and required intubation in setting of hemorrhagic and cardiogenic shock. Treated with dobutuamine and iloprost while intubated.  Extubated after 4 days.  Since extubation pulmonary has been following for persistently elevated oxygen requirements.    Pertinent  Medical History  Former smoker HTN DM Traumatic crush injury from a tree branch with multiple fractures Epistaxis requiring packing   Significant Hospital Events: Including procedures, antibiotic start and stop dates in addition to other pertinent events    4/18 admitted with SOB, echo with elevated right heart pressures, new finding of A. Fib  4/18 CT angiogram chest > small bilateral effusions, patchy infiltrates R lung, RML opacity, no PE  4/19 R/L heart cath with concern for pulmonary hypertension; Mean PA pressure 46, PCWP 13, PVR 7.8 WU  4/20 on 15L HFNC  4/21 hypotensive episode, responded to IVF, down to 5L O2  4/22 ABLA, to ICU, intubated, severe hypoxemia; EGD with nonbleeding duodenal ulcer  4/23 failed veletri wean  4/26 veletri weaned and pt extubated  4/27 transferred Out of ICU  4/29 PCCM re-consulted for recommendations regarding ongoing hypoxia and high oxygen demand. CCM ordering CXR, CTA chest  4/29 CT angiogram > no PE, progresive bilatearl perihilar airspace disease, worse in upper lobes, nodular consolidation in RML, R>L bilateral effusions . 5/3 R thoracentesis> 1.2 L fluid removed, exudate (1/3 light's criteria; pleural fluid LDH is > 2/3 upper limit of normal serum LDH, albumin gradient < 1), lymphocytic predominant . 09/20/2020 initiation of  steroids . 5/5 high-resolution CT chest redemonstrate spiculated mass anterior right middle lobe 3.4x1.7, small trace left pleural effusion; started on pulse-dose steroids . 5/7 down to 15L/ 40% . 5/8 on 22L/ 50%, needing more O2 at night  Interim History / Subjective:   Has worn CPAP more reliably over the last 2 nights His high flow nasal cannula transitioned to [place] has been decreased to 5 L/min Ambulated in hall yesterday   Objective   Blood pressure 105/64, pulse 92, temperature 98.5 F (36.9 C), temperature source Axillary, resp. rate 19, height 5' 7"  (1.702 m), weight 84.1 kg, SpO2 97 %.    FiO2 (%):  [40 %] 40 %   Intake/Output Summary (Last 24 hours) at 09/27/2020 0806 Last data filed at 09/27/2020 0370 Gross per 24 hour  Intake 840 ml  Output 1150 ml  Net -310 ml   Filed Weights   09/25/20 0351 09/26/20 0647 09/27/20 0329  Weight: 81.6 kg 83.9 kg 84.1 kg    Examination:  General: Comfortable, up to chair HEENT: No upper airway secretions, no stridor, oropharynx clear Neuro: Awake, alert, interacting appropriately, decreased hearing CV: Irregularly irregular, distant, no murmur PULM: Bibasilar inspiratory crackles, no wheezing GI: Nondistended, positive bowel sounds Extremities: Trace lower extremity edema, clearly improved Skin: No rash   Labs/imaging that I havepersonally reviewed  (right click and "Reselect all SmartList Selections" daily)   Serum creatinine 1.17 (overall stable) No leukocytosis  All labs and studies reviewed   Resolved Hospital Problem list     Assessment & Plan:   Acute respiratory failure with hypoxemia in setting of pulmonary hypertension and bilateral pulmonary infiltrates with R>L pleural effusion  R persistent pleural effusion, left effusion resolved. 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  He has had improvement in his pulmonary infiltrates with aggressive diuresis, also improved PASP, mean PAP with diuresis and the addition of sildenafil.  His PVR does not support adding targeted PAH meds.  No evidence of thromboembolic disease.  We have treated him with corticosteroids in the event that some of his interstitial infiltrates were inflammatory (autoimmune work-up unrevealing).  He is also on therapy for presumed obstructive lung disease (will need PFT as an outpatient).  He has a residual right middle lobe opacity that is masslike in appearance, concerning for possible primary lung malignancy although no evidence for lymphangitic process that would contribute to hypoxemia on imaging.  Plan/rec -Continue to wean nasal cannula O2 as he can tolerate -Continue to push pulmonary hygiene, ambulation -Planning for prednisone and wean over about 1 week, appreciate pharmacy adjustment -Continue scheduled sildenafil -Needs outpatient sleep study, PFT.  May need to try to arrange/qualify him for BiPAP at night at discharge.  He likely would need a compensated PCO2 > 50 in order to qualify.  We can assess this when he reaches a stable state. -He will need repeat imaging of his right middle lobe opacity once acute issues are resolved.  Depending on persistence on imaging, and overall course would discuss possible bronchoscopy - he absolutely would not tolerate now, likely would not be liberated from MV. Depending on O2 needs he may never be a good candidate,  and we would need to consider empiric XRT depending on trend.    Baltazar Apo, MD, PhD 09/27/2020, 8:06 AM [facility] [phone] or if no answer before 7:00PM call [phone] For any issues after 7:00PM please call eLink [phone]

## 2020-09-27 NOTE — Progress Notes (Signed)
Attempt x1 made to give pt his nebulizer treatments this am, pt eating breakfast at this time and wants to wait. Will check back.

## 2020-09-28 ENCOUNTER — Inpatient Hospital Stay (HOSPITAL_COMMUNITY): Payer: Medicare Other

## 2020-09-28 DIAGNOSIS — J810 Acute pulmonary edema: Secondary | ICD-10-CM

## 2020-09-28 DIAGNOSIS — J9601 Acute respiratory failure with hypoxia: Secondary | ICD-10-CM | POA: Diagnosis not present

## 2020-09-28 LAB — GLUCOSE, CAPILLARY
Glucose-Capillary: 66 mg/dL — ABNORMAL LOW (ref 70–99)
Glucose-Capillary: 237 mg/dL — ABNORMAL HIGH (ref 70–99)
Glucose-Capillary: 228 mg/dL — ABNORMAL HIGH (ref 70–99)
Glucose-Capillary: 224 mg/dL — ABNORMAL HIGH (ref 70–99)

## 2020-09-28 LAB — BASIC METABOLIC PANEL
Anion gap: 8 (ref 5–15)
BUN: 34 mg/dL — ABNORMAL HIGH (ref 8–23)
CO2: 29 mmol/L (ref 22–32)
Calcium: 7.5 mg/dL — ABNORMAL LOW (ref 8.9–10.3)
Chloride: 95 mmol/L — ABNORMAL LOW (ref 98–111)
Creatinine, Ser: 0.96 mg/dL (ref 0.61–1.24)
GFR, Estimated: >60 mL/min (ref >60)
Glucose, Bld: 101 mg/dL — ABNORMAL HIGH (ref 70–99)
Potassium: 3.7 mmol/L (ref 3.5–5.1)
Sodium: 132 mmol/L — ABNORMAL LOW (ref 135–145)

## 2020-09-28 LAB — CBC
HCT: 26.6 % — ABNORMAL LOW (ref 39.0–52.0)
Hemoglobin: 8.4 g/dL — ABNORMAL LOW (ref 13.0–17.0)
MCH: 27.9 pg (ref 26.0–34.0)
MCHC: 31.6 g/dL (ref 30.0–36.0)
MCV: 88.4 fL (ref 80.0–100.0)
Platelets: 403 10*3/uL — ABNORMAL HIGH (ref 150–400)
RBC: 3.01 MIL/uL — ABNORMAL LOW (ref 4.22–5.81)
RDW: 15.2 % (ref 11.5–15.5)
WBC: 10 10*3/uL (ref 4.0–10.5)
nRBC: 0.2 % (ref 0.0–0.2)

## 2020-09-28 MED ORDER — POTASSIUM CHLORIDE CRYS ER 20 MEQ PO TBCR
40.0000 meq | EXTENDED_RELEASE_TABLET | Freq: Once | ORAL | Status: AC
  Administered 2020-09-28: 40 meq via ORAL
  Filled 2020-09-28: qty 2

## 2020-09-28 NOTE — Progress Notes (Signed)
NAME:  Gary [name], MRN:  [id number], DOB:  [birth year], LOS: 24 ADMISSION DATE:  09/03/2020, CONSULTATION DATE:  4/22 REFERRING MD:  Nori Riis, CHIEF COMPLAINT:  Dyspnea   History of Present Illness:  70 y/o male admitted with dyspnea and new oxygen requirements.  Found to have pulmonary hypertension. Hospitalization complicated by GI bleeding, went to the ICU and required intubation in setting of hemorrhagic and cardiogenic shock. Treated with dobutuamine and iloprost while intubated.  Extubated after 4 days.  Since extubation pulmonary has been following for persistently elevated oxygen requirements.    Pertinent  Medical History  Former smoker HTN DM Traumatic crush injury from a tree branch with multiple fractures Epistaxis requiring packing   Significant Hospital Events: Including procedures, antibiotic start and stop dates in addition to other pertinent events    4/18 admitted with SOB, echo with elevated right heart pressures, new finding of A. Fib  4/18 CT angiogram chest > small bilateral effusions, patchy infiltrates R lung, RML opacity, no PE  4/19 R/L heart cath with concern for pulmonary hypertension; Mean PA pressure 46, PCWP 13, PVR 7.8 WU  4/20 on 15L HFNC  4/21 hypotensive episode, responded to IVF, down to 5L O2  4/22 ABLA, to ICU, intubated, severe hypoxemia; EGD with nonbleeding duodenal ulcer  4/23 failed veletri wean  4/26 veletri weaned and pt extubated  4/27 transferred Out of ICU  4/29 PCCM re-consulted for recommendations regarding ongoing hypoxia and high oxygen demand. CCM ordering CXR, CTA chest  4/29 CT angiogram > no PE, progresive bilatearl perihilar airspace disease, worse in upper lobes, nodular consolidation in RML, R>L bilateral effusions . 5/3 R thoracentesis> 1.2 L fluid removed, exudate (1/3 light's criteria; pleural fluid LDH is > 2/3 upper limit of normal serum LDH, albumin gradient < 1), lymphocytic predominant . 09/20/2020 initiation of  steroids . 5/5 high-resolution CT chest redemonstrate spiculated mass anterior right middle lobe 3.4x1.7, small trace left pleural effusion; started on pulse-dose steroids . 5/7 down to 15L/ 40% . 5/8 on 22L/ 50%, needing more O2 at night . 09/24/20  repeat RHC : PA 47/23 (32) PCW 14 . 5/13 down to 5L [name].   Interim History / Subjective:   Improved oxygen requirements today- down to 4L. Was able to walk in the hall today. Feeling well overall. Slept great with CPAP the past 2 nights.  Objective   Blood pressure 109/70, pulse (!) 102, temperature 97.8 F (36.6 C), temperature source Oral, resp. rate (!) 23, height 5' 7"  (1.702 m), weight 83.1 kg, SpO2 91 %.    FiO2 (%):  [40 %] 40 %   Intake/Output Summary (Last 24 hours) at 09/28/2020 1446 Last data filed at 09/28/2020 0830 Gross per 24 hour  Intake 540 ml  Output 2975 ml  Net -2435 ml   Filed Weights   09/26/20 0647 09/27/20 0329 09/28/20 0319  Weight: 83.9 kg 84.1 kg 83.1 kg    Examination:  General: elderly man sitting up in the chair in NAD HEENT: [place]/AT, eyes anicteric Neuro: awake and alert, moving all extremities spontaneously, answering questions appropriately. CV: reg rate, irreg rhythm PULM: faint basilar rhales, no wheezing, no conversational dyspnea GI: soft, NT, ND Extremities: minimal LE edema, TED hose in place Skin: no rashes or wounds   Labs/imaging that I havepersonally reviewed  (right click and "Reselect all SmartList Selections" daily)   Cr 0.96 BUN 34 H/H 8.4/2.6  CXR personally reviewed> persistent LL patchy opacities, improved upper lobes   Resolved Hospital  Problem list     Assessment & Plan:   Acute respiratory failure with hypoxemia in setting of pulmonary hypertension and bilateral pulmonary infiltrates with R>L pleural effusion R persistent pleural effusion, left effusion resolved. WHO group 3 pulmonary hypertension Atrial fibrillation  Barret's esophagitis Gastritis due to H. 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 CAD, nonobstructive. Acute blood loss anemia and hemorrhagic shock  Pulm dx Acute respiratory failure with hypoxemia in setting of pulmonary hypertension and bilateral pulmonary infiltrates with R>L pleural effusion R>L exudative pleural effusions, postthoracentesis WHO group 3 pulm hypertension Occupational smoke exposure, COPD Right middle lobe nodule, question malignancy  Discussion This remains a difficult case.  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  He has had improvement in his pulmonary infiltrates with aggressive diuresis, also improved PASP & mean PAP with diuresis and the addition of sildenafil.  No evidence of thromboembolic disease.  We have treated him with corticosteroids in the event that some of his interstitial infiltrates were inflammatory (autoimmune work-up unrevealing), now tapering.  He is also on therapy for presumed obstructive lung disease (will need PFT as an outpatient).  He has a residual right middle lobe opacity that is masslike in appearance, concerning for possible primary lung malignancy although no evidence for lymphangitic process that would contribute to hypoxemia on imaging.  Plan/recs con't steroid taper  -Con't weaning O2 as able; currently on 5L [place].  -Ambulate, pulmonary hygiene. Ready for CIR when they have a bed. -Weaning prednisone. -Con't daily diuretic regimen & TED hose. -Continue scheduled sildenafil & midodrine for hemodynamic support.  -Needs outpatient sleep study & PFT.  Will need to discharge on CPAP. If he has ongoing hypercapnia when he is more compensated (closer to discharge), would consider BiPAP, but he needs a PCO2 >50 to qualify. Some of the hypercapnia may be compensatory from metabolic alkalosis. We can assess this when he reaches a stable state. -He will need repeat imaging of his right middle lobe opacity once acute issues are resolved.   Depending on persistence on imaging and his overall course would discuss possible bronchoscopy - he absolutely would not tolerate now and likely would not be liberated from MV post-procedurally. Depending on O2 needs he may never be a good candidate, and we would need to consider empiric XRT depending on trend. Can follow up outpatient with Dr. Lamonte Sakai or Dr. Valeta Harms after he is discharged.   D/w primary team.  Julian Hy, DO 09/28/20 4:42 PM [facility]

## 2020-09-28 NOTE — Progress Notes (Signed)
Physical Therapy Treatment Patient Details Name: Gary [name] MRN: [id number] DOB: Aug 14, 1950 Today's Date: 09/28/2020    History of Present Illness Patient is a 70 y/o male who presents on 09/03/20 with progressive SOB. Found to have acute respiratory failure, likely due to new onset of heart failure and A-fib with RVR. CXR with R-side effusion, prominent interstitial edema/disease. S/p cardiac cath 4/19. Pt with hypotension 4/21 and drop in hgb on 4/22. Workup for GIB with intubation and urgent bedside EGD on 4/22 with PT sign off. Extubated on 4/26. PT reordered on 4/27. Cardiac cath 5/9 with no significant findings. Weaned off HHFNC to 5L HFNC on 5/11. PMH includes HTN, DM.    PT Comments    Pt admitted with above diagnosis. Pt had just ambulated with nursing 1 hour before.  Pt performed exercises with rest breaks, discussed Energy conservation techniques and reviewed incentive spirometer.  Pt desat to 85% on 5L with activity but recovers to 88-90% with rest breaks.  Will continue to progress pt as able.  Pt currently with functional limitations due to balance and endurance deficits. Pt will benefit from skilled PT to increase their independence and safety with mobility to allow discharge to the venue listed below.     Follow Up Recommendations  CIR;Supervision for mobility/OOB     Equipment Recommendations   (TBD - RW vs. rollator)    Recommendations for Other Services       Precautions / Restrictions Precautions Precautions: Fall;Other (comment) Precaution Comments: Watch SpO2 (quick to desaturate on 6L O2 [place] with activity) Restrictions Weight Bearing Restrictions: No    Mobility  Bed Mobility               General bed mobility comments: Received sitting in recliner    Transfers                 General transfer comment: sit<>stands from recliner supervision for safety. Pt walked an hour before with nursing  Ambulation/Gait                 Stairs              Wheelchair Mobility    Modified Rankin (Stroke Patients Only)       Balance Overall balance assessment: Needs assistance Sitting-balance support: No upper extremity supported;Feet supported Sitting balance-Leahy Scale: Good     Standing balance support: Bilateral upper extremity supported Standing balance-Leahy Scale: Fair Standing balance comment: Can static stand without UE support; static and dynamic stability, leaning against counter at times                            Cognition Arousal/Alertness: Awake/alert Behavior During Therapy: Anxious Overall Cognitive Status: Within Functional Limits for tasks assessed                                 General Comments: Some anxiety related to SOB with mobility; responds well to cues for pursed lip breathing (good recall from prior sessions) and encouragement      Exercises General Exercises - Upper Extremity Shoulder Flexion: AROM;Both;10 reps;Seated Shoulder Horizontal ADduction: AROM;Both;10 reps;Seated General Exercises - Lower Extremity Ankle Circles/Pumps: Both;Seated;Strengthening;15 reps Long Arc Quad: Both;Seated;Strengthening;AROM;10 reps Hip Flexion/Marching: Both;10 reps;Seated;Strengthening;AROM Other Exercises Other Exercises: Encouraged more frequent seated LE therex - LAQ, seated march, ankle/heel raises Other Exercises: chair push-ups with BUE and BLE support x5 reps Other  Exercises: Reviewed incentive spirometer Other Exercises: Deep breathing exercises with retraction of shoulder blades for incr expansion of lungs x 10    General Comments General comments (skin integrity, edema, etc.): SpO2 on arrival was 86-88% on 5L.  With exercise lowest SpO2 was 85% with exercise. Needs rest breaks after each bout of exercise.  Encouraged pursed lip breathing and discussed Energy Conservation handout in depth.  Pt appreciative.      Pertinent Vitals/Pain Pain Assessment: No/denies  pain    Home Living                      Prior Function            PT Goals (current goals can now be found in the care plan section) Acute Rehab PT Goals Patient Stated Goal: Get stronger and return home more independent Progress towards PT goals: Progressing toward goals    Frequency    Min 3X/week      PT Plan Current plan remains appropriate    Co-evaluation              AM-PAC PT "6 Clicks" Mobility   Outcome Measure  Help needed turning from your back to your side while in a flat bed without using bedrails?: None Help needed moving from lying on your back to sitting on the side of a flat bed without using bedrails?: A Little Help needed moving to and from a bed to a chair (including a wheelchair)?: A Little Help needed standing up from a chair using your arms (e.g., wheelchair or bedside chair)?: A Little Help needed to walk in hospital room?: A Little Help needed climbing 3-5 steps with a railing? : A Lot 6 Click Score: 18    End of Session Equipment Utilized During Treatment: Gait belt;Oxygen Activity Tolerance: Patient tolerated treatment well Patient left: in chair;with call bell/phone within reach;with family/visitor present Nurse Communication: Mobility status PT Visit Diagnosis: Muscle weakness (generalized) (M62.81);Difficulty in walking, not elsewhere classified (R26.2);Other (comment)     Time: 1610-9604 PT Time Calculation (min) (ACUTE ONLY): 28 min  Charges:  $Therapeutic Exercise: 8-22 mins $Therapeutic Activity: 8-22 mins                     Gary [name] M,PT Acute Rehab Services [phone] (pager)   Gary [name] 09/28/2020, 4:19 PM

## 2020-09-28 NOTE — Discharge Summary (Signed)
[name] Discharge Summary  Patient name: Gary [name] Medical record number: [id number] Date of birth: 1951-04-22 Age: 70 y.o. Gender: male Date of Admission: 09/03/2020  Date of Discharge: 10/02/2020 Admitting Physician: Gary Feil, MD  Primary Care Provider: Jalene Mullet, PA-C Consultants: Pulmonology, Heart Failure, Cardiology  Indication for Hospitalization: Acute hypoxic respiratory failure  Discharge Diagnoses/Problem List:  Principal Problem:   Acute respiratory failure with hypoxia [facility]) Active Problems:   Acute congestive heart failure ([place])   Hyperlipidemia due to type 2 diabetes mellitus (HCC)   Atrial fibrillation ([place])   Right bundle branch block   Atypical atrial flutter (HCC)   Acute on chronic right heart failure (HCC)   ARDS (adult respiratory distress syndrome) (HCC)   Pleural effusion, right  Disposition: CIR  Discharge Condition: Stable  Discharge Exam:  Temp:  [98.1 F (36.7 C)-98.4 F (36.9 C)] 98.1 F (36.7 C) (05/17 0400) Pulse Rate:  [70-105] 70 (05/17 0400) Resp:  [15-22] 20 (05/17 0400) BP: (101-130)/(58-80) 124/80 (05/17 0400) SpO2:  [91 %-99 %] 95 % (05/17 0837) Physical Exam: General: Patient sitting comfortably in recliner chair, NAD  Heart: Irregular rhythm, no murmur auscultated  Lungs: Clear to auscultation bilaterally  Abdomen: Soft, nondistended, nontender, bowel sounds present  MSK: No edema  Skin: Warm and dry  Brief Hospital Course:  Gary [name] is a 70 y.o. male presenting with worsening dypsnea. PMH is significant for hypertension, DM, depression, history of epistaxis (requiring IR-guided embolization) and hyperlipidemia.   Acute hypoxemic respiratory failure secondary to [facility] with cor pulmonale Presented to the ED with worsening dyspnea, tachypneic at 24 and tachycardic to 123.  Initially satting at 77% on room air, required 15 L nonrebreather.  No previous home oxygen requirement.  CXR  notable for bibasilar atelectasis, edema or infiltrates noted with probable small left pleural effusion and mild cardiomegaly. Labs notable for troponin 98. EKG notable for atrial fibrillation without notable ST changes. In the ED, given IV lasix 40 mg once along with metoprolol tartrate. BNP 1202.7. Echo with EF 55-60% with mild concentric left ventricular hypertrophy, right atrial size moderately dilated with mild mitral valve regurgitation and moderate tricuspid valve regurgitation. 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  Patient was treated with diuresis.  Transferred to ICU on 4/22 due to need for intubation to undergo EGD (see below). In ICU, sputum culture grew Strep pneumo, completed 5 day course of ceftriaxone. Required pressor support while in the ICU (dobutamine, norepinephrine, vasopressin, milrinone).  He was also started epoprostenol for RV support which was weaned off prior to extubation. Extubated on 4/26 onto HFNC up to 40L FiO2 100%.  He was treated with sildenafil and bronchodilators.  He was continued on midodrine 10 mg 3 times daily for BP support.  Repeat echo performed and showed EF 55-60%, moderately elevated pulmonary artery systolic pressure, severe right ventricular enlargement, flattened interventricular septum concerning for PE. CTA negative. Given continued high oxygen support, patient had bedside ultrasound which demonstrated pleural effusions.  Thoracentesis was performed and drained 1200 cc of fluid.  Fluid appeared exudative but fluid analysis was negative for triglycerides, malignancy, infection. 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  Patient had positive chromatin antibody, though further autoimmune work-up was negative.  His need for continued high flow oxygen was  unclear and he was trialed on  pulse dose steroids with only mild improvement.  Unable to perform bronchoscopy given high oxygen requirement.  Patient had a repeat chest x-ray 5/7 which showed small right pleural effusion and otherwise stable spiculated mass seen previously.  He received extensive diuresis during hospitalization without significant change in respiratory status. Was started on daily Torsemide 20 mg to maintain euvolemia. Right heart cath was repeated on 5/9 and showed only mild PAH with normal to increased cardiac output without intracardiac shunting. Echo with bubble study done to evaluate for large peripheral shunt and was negative.  Patient was trialed on CPAP the night of 5/10, and had drastic improvement the following day-was able to be titrated to 5 L nasal cannula. Worked with PT and able to maintain stable oxygen saturations on nasal cannula.   Upper GI bleed Hgb dropped to 6.7 on 4/22 in the setting of starting anticoagulation likely secondary to active GI bleed with noted melena. FOBT positive. Anticoagulation held. CT abdomen/pelvis demonstrated no signs of retroperitoneal hematoma. Hgb continued to drop, so patient was admitted to ICU so he could be intubated to undergo EGD, with finding of two non-bleeding duodenal ulcers.  He was started on twice daily PPI.  Extubated 4/26. Total 6 days ICU care.  Triple therapy for age pylori with started 5/2.  Hgb remained stable for the remainder of admission, even after reinitiation of Eliquis. Hgb on day of discharge 9.9. Received total of 3 units of blood during entire admission.   H. pylori infection Positive H. pylori on EGD biopsy. Treated with triple therapy starting 5/2 with Protonix, Amoxicillin and Clarithromycin. Finished on 5/15.   New onset atrial fibrillation EKG notable for atrial fibrillation on admission, possibly secondary to worsening heart failure. CHADS VASc score of 3 and HAS BLED score of 1.  Initial heart rate elevated  in the 110-115 range, treated with metoprolol tartrate 2.5 mg in the ED. Cardiology consulted, planned for TEE and cardioversion 4/22; however this was delayed due to GI bleed. Due to hypotension, he was started on amiodarone gtt eventually transitioned to PO amiodarone on 5/1. He fluctuated between NSR and atrial flutter/A-fib/A-fib with RVR. Anticoagulation held during hospitalization after GI bleed, restarted on 5/12 and patient remained stable without concern for rebleed.  During his bout of RVR, he was started on diltiazem which controlled rate adequately.   Elevated troponin Troponin 98 on admission, likely due to demand ischemia.  Patient without complaints of chest pain.  EKG with some T wave inversions in the lateral leads, no previous EKG to compare to.  Other chronic conditions stable during admission.  Issues for follow up: 1. Will need outpatient PFTs and sleep study per cardiology. 2. Discharged with 5L O2. 3. Will need follow up CT to follow up on right lung mass and persistent nodule consolidation within the right muddle lobe where underlying neoplasm cannot be ruled out.  4. Consider starting statin 5. Took off of aspirin while on anticoagulation.  6. Urea breath test for h. Pylori test of cure should be done after 6/12 (4 weeks after completion of treatment).  7. Consider starting Trelegy outpatient.    Significant Procedures:  4/22: Intubation, Central Venous Catheter, Arterial line, Upper endoscopy 4/19: Right/Left heart cath and coronary angiography 4/26: Extubation 5/3: Thoracentesis with 1200 cc cloudy serosanguinous fluid from right pleural space 5/9: Repeat Right heart cath 5/10: Echo bubble study: negative   Significant Labs and Imaging:  Recent Labs  Lab 09/30/20 0126 10/01/20 0044 10/02/20 0206  WBC 11.5*  10.8* 12.4*  HGB 9.5* 9.0* 9.9*  HCT 30.2* 28.8* 31.1*  PLT 449* 419* 405*   Recent Labs  Lab 09/27/20 0048 09/28/20 0047 09/29/20 0030  09/30/20 0126 10/01/20 0044 10/02/20 0206  NA 133* 132* 133* 134* 132* 133*  K 3.6 3.7 4.4 4.0 4.1 4.3  CL 95* 95* 95* 96* 93* 92*  CO2 26 29 26 28 29 29   GLUCOSE 142* 101* 182* 117* 155* 139*  BUN 40* 34* 36* 31* 31* 28*  CREATININE 1.17 0.96 1.00 0.94 1.11 1.09  CALCIUM 7.6* 7.5* 7.6* 7.9* 7.7* 8.1*  MG 1.8  --   --   --  1.5* 1.8  ALBUMIN 2.0*  --   --   --   --   --     Results/Tests Pending at Time of Discharge: None  Discharge Medications:  Allergies as of 10/02/2020      Reactions   Novocain [procaine] Other (See Comments)   Unknown reaction as a young child      Medication List    STOP taking these medications   amLODipine 5 MG tablet Commonly known as: NORVASC   buPROPion 150 MG 12 hr tablet Commonly known as: WELLBUTRIN SR   enalapril-hydrochlorothiazide 10-25 MG tablet Commonly known as: VASERETIC   glipiZIDE 10 MG 24 hr tablet Commonly known as: GLUCOTROL XL   pioglitazone 15 MG tablet Commonly known as: ACTOS     TAKE these medications   acetaminophen 325 MG tablet Commonly known as: TYLENOL Take 650 mg by mouth every 6 (six) hours as needed for headache (pain).   amiodarone 200 MG tablet Commonly known as: PACERONE Take 1 tablet (200 mg total) by mouth daily.   apixaban 5 MG Tabs tablet Commonly known as: ELIQUIS Take 1 tablet (5 mg total) by mouth 2 (two) times daily.   aspirin EC 81 MG tablet Take 81 mg by mouth every morning. Swallow whole.   atorvastatin 40 MG tablet Commonly known as: LIPITOR Take 40 mg by mouth every evening.   Breo Ellipta 200-25 MCG/INH Aepb Generic drug: fluticasone furoate-vilanterol Inhale 1 puff into the lungs daily.   diltiazem 180 MG 24 hr capsule Commonly known as: CARDIZEM CD Take 1 capsule (180 mg total) by mouth daily. Start taking on: Oct 03, 2020   empagliflozin 10 MG Tabs tablet Commonly known as: JARDIANCE Take 1 tablet (10 mg total) by mouth daily.   feeding supplement (GLUCERNA SHAKE)  Liqd Take 237 mLs by mouth 3 (three) times daily between meals.   ferrous sulfate 325 (65 FE) MG tablet Take 1 tablet (325 mg total) by mouth daily with breakfast.   Incruse Ellipta 62.5 MCG/INH Aepb Generic drug: umeclidinium bromide Inhale 1 puff into the lungs daily.   metFORMIN 500 MG tablet Commonly known as: GLUCOPHAGE Take 2 tablets (1,000 mg total) by mouth 2 (two) times daily with a meal. What changed:   medication strength  how much to take  when to take this  additional instructions   midodrine 10 MG tablet Commonly known as: PROAMATINE Take 1 tablet (10 mg total) by mouth 3 (three) times daily with meals.   mouth rinse Liqd solution 15 mLs by Mouth Rinse route 2 times daily at 12 noon and 4 pm.   multivitamin with minerals Tabs tablet Take 1 tablet by mouth daily.   pantoprazole 40 MG tablet Commonly known as: PROTONIX Take 1 tablet (40 mg total) by mouth daily. Start taking on: Oct 03, 2020   saline  Gel Place 1 application into both nostrils 2 (two) times daily.   sodium chloride 0.65 % Soln nasal spray Commonly known as: OCEAN Place 1 spray into both nostrils as needed for congestion.   sildenafil 20 MG tablet Commonly known as: REVATIO Take 2 tablets (40 mg total) by mouth 3 (three) times daily.   torsemide 20 MG tablet Commonly known as: DEMADEX Take 1 tablet (20 mg total) by mouth daily.            Durable Medical Equipment  (From admission, onward)         Start     Ordered   10/01/20 1652  For home use only DME 3 n 1  Once       Comments: 3 in 1 bedside commode   10/01/20 1652          Discharge Instructions: Please refer to Patient Instructions section of EMR for full details.  Patient was counseled important signs and symptoms that should prompt return to medical care, changes in medications, dietary instructions, activity restrictions, and follow up appointments.   Follow-Up Appointments:   Honor Junes,  MD 10/02/2020, 11:08 AM PGY-1, [place]

## 2020-09-28 NOTE — Progress Notes (Signed)
Patient ID: Gary [name], male   DOB: 10/10/1950, 70 y.o.   MRN: [id number]     Advanced Heart Failure Rounding Note  PCP-Cardiologist: Elouise Munroe, MD   Subjective:   Events  4/22  EGD 4/22 showed 2 ulcers in duodenum that were not actively bleeding.  On PO Protonix, Hgb 10>9.3>8.6  4/23 Intubated. Dobutamine stopped due to marked tachycardia. Started antibiotics for pneumonia. Blood CX- NGTD. Sputum with strep.  4/24 Did not tolerate fast veletri wean.  4/25 Veletri stopped. Extubated. Given 1u PRBC  5/1 Amio gtt stopped>>PO 400 mg daily  5/3 Right thoracentesis with 1200cc out (mostly transudative) 5/5 In A fib . High Res CT- redemonstrated spiculated mass anterior RML with trace left pleural effusion.  5/5 Started on pulse-dose steroids 5/9 RHC mild pulmonary htn, normal left sided filling pressures, high cardiac output.  5/10 Bubble Study Negative   Remains off HFNC. Feels good. Able to ambulate. AF rate much improved with cardizem. Back on Eliquis  Hgb stable at 8.4   Objective:   Weight Range: 83.1 kg Body mass index is 28.69 kg/m.   Vital Signs:   Temp:  [97.6 F (36.4 C)-98.5 F (36.9 C)] 98.1 F (36.7 C) (05/13 0319) Pulse Rate:  [72-84] 74 (05/13 0319) Resp:  [15-24] 16 (05/13 0319) BP: (91-104)/(54-67) 104/60 (05/13 0319) SpO2:  [90 %-100 %] 91 % (05/13 0809) FiO2 (%):  [40 %] 40 % (05/12 1553) Weight:  [83.1 kg] 83.1 kg (05/13 0319) Last BM Date: 09/25/20  Weight change: Filed Weights   09/26/20 0647 09/27/20 0329 09/28/20 0319  Weight: 83.9 kg 84.1 kg 83.1 kg    Intake/Output:   Intake/Output Summary (Last 24 hours) at 09/28/2020 0921 Last data filed at 09/28/2020 0404 Gross per 24 hour  Intake 300 ml  Output 3175 ml  Net -2875 ml      Physical Exam   General:  Sitting in chair No resp difficulty HEENT: normal Neck: supple. no JVD. Carotids 2+ bilat; no bruits. No lymphadenopathy or thryomegaly appreciated. Cor: PMI nondisplaced.  Irregular rate & rhythm. No rubs, gallops or murmurs. Lungs: decreased at basese  Abdomen: obese soft, nontender, nondistended. No hepatosplenomegaly. No bruits or masses. Good bowel sounds. Extremities: no cyanosis, clubbing, rash, edema Neuro: alert & orientedx3, cranial nerves grossly intact. moves all 4 extremities w/o difficulty. Affect pleasant   Telemetry    A fib 70s Personally reviewed  Labs    CBC Recent Labs    09/27/20 0048 09/28/20 0047  WBC 9.5 10.0  HGB 8.5* 8.4*  HCT 26.6* 26.6*  MCV 89.3 88.4  PLT 404* 025*   Basic Metabolic Panel Recent Labs    09/27/20 0048 09/28/20 0047  NA 133* 132*  K 3.6 3.7  CL 95* 95*  CO2 26 29  GLUCOSE 142* 101*  BUN 40* 34*  CREATININE 1.17 0.96  CALCIUM 7.6* 7.5*  MG 1.8  --    Liver Function Tests Recent Labs    09/27/20 0048  ALBUMIN 2.0*   No results for input(s): LIPASE, AMYLASE in the last 72 hours. Cardiac Enzymes No results for input(s): CKTOTAL, CKMB, CKMBINDEX, TROPONINI in the last 72 hours.  BNP: BNP (last 3 results) Recent Labs    09/03/20 1433 09/20/20 0047  BNP 1,202.7* 360.6*    ProBNP (last 3 results) No results for input(s): PROBNP in the last 8760 hours.   D-Dimer No results for input(s): DDIMER in the last 72 hours. Hemoglobin A1C No results for input(s): HGBA1C  in the last 72 hours. Fasting Lipid Panel No results for input(s): CHOL, HDL, LDLCALC, TRIG, CHOLHDL, LDLDIRECT in the last 72 hours. Thyroid Function Tests No results for input(s): TSH, T4TOTAL, T3FREE, THYROIDAB in the last 72 hours.  Invalid input(s): FREET3  Other results:   Imaging    DG Chest Port 1 View  Result Date: 09/28/2020 CLINICAL DATA:  Shortness of breath, hypoxemia EXAM: PORTABLE CHEST 1 VIEW COMPARISON:  09/22/2020 FINDINGS: Cardiomegaly. Bilateral perihilar and lower lobe airspace opacities, worsening since prior study, right slightly greater than left. Suspect small effusions. No acute bony  abnormality. IMPRESSION: Worsening bilateral airspace disease, right greater than left could reflect asymmetric edema or infection. Electronically Signed   By: Rolm Baptise M.D.   On: 09/28/2020 08:17     Medications:     Scheduled Medications: . sodium chloride   Intravenous Once  . amiodarone  200 mg Oral Daily  . amoxicillin  1,000 mg Oral Q12H  . apixaban  5 mg Oral BID  . arformoterol  15 mcg Nebulization BID  . atorvastatin  40 mg Oral Daily  . budesonide  0.5 mg Nebulization BID  . chlorhexidine  15 mL Mouth Rinse BID  . clarithromycin  500 mg Oral Q12H  . diltiazem  120 mg Oral Daily  . empagliflozin  10 mg Oral Daily  . feeding supplement (GLUCERNA SHAKE)  237 mL Oral TID BM  . ferrous sulfate  325 mg Oral Q breakfast  . insulin aspart  0-15 Units Subcutaneous TID WC  . insulin glargine  10 Units Subcutaneous Daily  . mouth rinse  15 mL Mouth Rinse q12n4p  . metFORMIN  1,000 mg Oral BID WC  . midodrine  10 mg Oral TID WC  . multivitamin with minerals  1 tablet Oral Daily  . pantoprazole  40 mg Oral BID  . potassium chloride  40 mEq Oral Once  . [START ON 09/29/2020] predniSONE  30 mg Oral Q breakfast   Followed by  . [START ON 09/30/2020] predniSONE  20 mg Oral Q breakfast   Followed by  . [START ON 10/01/2020] predniSONE  10 mg Oral Q breakfast   Followed by  . [START ON 10/02/2020] predniSONE  5 mg Oral Q breakfast  . revefenacin  175 mcg Nebulization Daily  . saline  1 application Each Nare BID  . sildenafil  40 mg Oral TID  . sodium chloride flush  10-40 mL Intracatheter Q12H  . sodium chloride flush  3 mL Intravenous Q12H  . torsemide  20 mg Oral Daily    Infusions: . sodium chloride Stopped (09/10/20 1336)  . sodium chloride Stopped (09/24/20 0700)    PRN Medications: sodium chloride, acetaminophen, guaiFENesin-dextromethorphan, ondansetron (ZOFRAN) IV, polyethylene glycol, sodium chloride    Assessment/Plan    1. Acute upper GI bleed with  symptomatic anemia - hgb dropped to 6.7. Transfused.->  Eliquis held  - CT negative for RP bleed - EGD with 2 ulcers not acutely bleeding.  - On protonix PO 40 mg bid  - Off  AC for 2 weeks to allow GI healing - Biopsy back for H pylori. On treatment per GI - Eliquis restarted 5/12 -> Hgb stable at 8.4   2. PAH with cor pulmonale - CT chest 4/22: No PE or ILD - Echo LVEF 60% severe RV dilation and HK - Cath with moderate PAH. 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 Note abnormal ANA, with borderline elevation in chromatin antibody, but all other serologies negative. ANCA pendingESR only mildly abnormal at 44 - repeat RHC 09/24/20 PA 47/23 (32) PCW 14 - Continue sildenafil 40 mg tid. On midodrine 10 mg three times a day for Bp support  - Volume status stable.  - Continue jardiance 10 mg daily and torsemide 20 daily   3. Paroxysmal AF - new onset. Unclear duration  - Eliquis on hold d/t GI bleed - Was in NSR 2 weeks ago --> back in AF - Off AC with recent large GI bleed - Continue SCDs.  - Continue PO amio 400 mg daily . - Not candidate for DC-CV without AC.  - Will restart Eliquis today. May be candidate for [facility] LAA closure if we can get him stable on [facility] for 1-2 months. Would avoid long-term amio use given underlying lung disease if at all possible.  - Rates improved on cardizem 120    4. CAD  -mostly non-obstructive - Continue atorvastatin 40 mg daily.  - No ASA with AC - No s/s ischemia   5. R lung mass - High Res CT- redemonstrated spiculated mass anterior RML with trace left pleural effusion.   6. Shock - Suspect mixed hemorrhagic/cardiogenic (RV failure).   - Resolved  7. Acute hypoxemic respiratory failure with bilateral pleural effusions - Intubated pre-EGD on 4/22.   -  Extubated--> on HFNC-- very slow wean. On 30 L + 40%  - Respiratory status remains tenuous. As noted before, degree of hypoxemia far out of proportion to HF or PAH. Continue with aggressive pulmonary toilet and mobilization. - With response to O2 supplementation shunt physiology less likely.  -s/p R thora with 1200 cc out. (mostly transudative)  - High Res CT- redemonstrated spiculated mass anterior RML with trace left pleural effusion. - RHC 5/9 w/ mild pulmonary htn, normal left sided filling pressures, high cardiac output. - ? PVOD but would suspect pulmonary pressures to be higher - Echo w/o evidence of peripheral shunting  - Will need PFTs as an outpatient  - Has improved gradually.  - Continue torsemide 20 daily to keep dry  I spoke with CIR. He is ready for transfer from cardiac perspective if/when bed available.    Length of Stay: [place], MD  09/28/2020, 9:21 AM  Advanced Heart Failure Team Pager [phone] (M-F; 7a - 5p)  Please contact [facility] for night-coverage after hours (5p -7a ) and weekends on [email or web address]

## 2020-09-28 NOTE — TOC Progression Note (Signed)
Transition of Care [facility]) - Progression Note    Patient Details  Name: Gary [name] MRN: [id number] Date of Birth: 07/03/1950  Transition of Care [facility] CM/SW Contact  Zenon Mayo, RN Phone Number: 09/28/2020, 8:31 AM  Clinical Narrative:    Aacute resp failure, pulm htn, bil pl effusions, paf, eliquis pta. Still afib, on low dose cardizem CIR following for possible admit when medically ready. TOC will cont to follow for dc needs.          Expected Discharge Plan and Services                                                 Social Determinants of Health (SDOH) Interventions Food Insecurity Interventions: Intervention Not Indicated Financial Strain Interventions: Intervention Not Indicated Housing Interventions: Intervention Not Indicated Transportation Interventions: Intervention Not Indicated  Readmission Risk Interventions No flowsheet data found.

## 2020-09-28 NOTE — Progress Notes (Addendum)
Inpatient Rehab Admissions Coordinator:   Note that pt has been on 8L or less for the last 24 hours, including with mobility.  Still with some AF with RVR, so will follow over the weekend but could be approaching readiness for CIR.    Addendum: Received call from Dr. Haroldine Laws pt's HR is stable in the 102s.  Will follow for timing of potential admission pending bed availability.   Shann Medal, PT, DPT Admissions Coordinator [phone] 09/28/20  8:36 AM

## 2020-09-28 NOTE — Progress Notes (Addendum)
Family Medicine Teaching Service Daily Progress Note Intern Pager: [phone]  Patient name: Gary [name] Medical record number: [id number] Date of birth: April 09, 1951 Age: 70 y.o. Gender: male  Primary Care Provider: Jalene Mullet, PA-C Consultants: Cardiology, Pulmonology Code Status: FULL  Pt Overview and Major Events to Date:  4/18 admitted 4/19 right/left heart cath with concern for pulmonary HTN 4/22 2u pRBC, intubated, EGD (nonbleeding duodenal ulcers), transferred to ICU 4/26 extubated, 1u pRBC 4/28 FPTS resumed care 5/3: 1200 cc serosanguinous fluid removed via thoracentesis 5/5: HR CT with spiculated mass in RML with hilar adenopathy; started on pulse dose steroids 5/9: Repeat RHC 5/10: Echo with bubble study 5/11: Weaned to 5L [place]   Assessment and Plan: Glenda Kunst a 70 y.o.malewhopresentedwith acute hypoxemic respiratory failure 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 PMHx significant for: HTN, T2DM, HLD, depression.  Acute Hypoxemic Respiratory Failure 2/2unknown etiology  mildPAH  Breathing status appears stable. UOP 3.1L yesterday. Total net since admission is -21.6L. Down 7 lbs since admission. Can likely start to work towards disposition given improved oxygen requirement (on 5L currently). Recommended for CIR.  -Cardiology following; appreciate recommendations             -Continue Torsemide 20 mg daily  -Pulmonology following; appreciation recommendations -Tapering steroids -Continue PPI for GI ppx -Repeat imaging of right middle lobe opacity once acute issues resolved             -Possible bronchoscopy in the future; would not tolerate now  -Continue Sildenafil 40 mg TID -Continue LABA/LAMA/ICS -Wean O2 as tolerated to maintain satsgreater than 90% -Pulmonary hygiene, mobilization -Continue CPAP  nightly  Atrial Fibrillation Cardizem started yesterday and HR has improved to 70's. K 3.7.  -Cardiology following, appreciate recommendations. -Diltiazem 120 mg daily  -Eliquis 60m BID -Continue Amiodarone 400 mg daily  -Goal K >4, Mg >2 -K-dur 40 mEq x1  Hypoalbuminemia  -Continue protein supplementation   AKI: Stable, improved Creatinine1.0>1.17>0.96. On daily Torsemide.Baseline appears to be around 0.9. -Monitor with BMP  [name]: Stable On triple therapy.Hemoglobin remains stable. -Continue triple therapy: Amoxicillin 1g BID, Clarithromycin 500 mg BID, Protonix 40 mg BID x14 days(5/2-5/15) -Will need TOCoutpatientafter treatment  Severe Protein Calorie Malnutrition -Continue nutritional supplementation  CAD: Chronic, stable -Atorvastatin 40 mg daily -Holding ASA  Type 2 DM: Chronic, stable CBG's66--262in last 24 hours. Home medications include Metformin, pioglitazone and Glipizide. Low of 66 was this AM, will stop night coverage. -Monitor CBG's -Decrease Lantus to 10units daily -mSSI -D/c HS coverage -Continuemetformin 1000 mg BID -Continue10 mg Empagliflozin -HoldhomeGlipizideand pioglitazone  Hypertension with recent Hypotension BP's ranging91-104/54-67. -Continue Midodrine 10 mg TID  -Monitor BP's  FEN/GI:Heart Healthy PZYY:QMGNOIB5 mg BID    Status is: Inpatient  Remains inpatient appropriate because:Ongoing diagnostic testing needed not appropriate for outpatient work up and Inpatient level of care appropriate due to severity of illness   Dispo: The patient is from: Home              Anticipated d/c is to: CIR              Patient currently is not medically stable to d/c.   Difficult to place patient No   Subjective:  Patient feels well this morning.  Was able to walk from his room into the hall and back to the history and yesterday.  States that he is planning to walk down the hall today, is  excited  for this.  He is excited for his progression and looking forward to the next step of going to rehab.   Objective: Temp:  [97.6 F (36.4 C)-98.5 F (36.9 C)] 98.1 F (36.7 C) (05/13 0319) Pulse Rate:  [72-84] 74 (05/13 0319) Resp:  [15-24] 16 (05/13 0319) BP: (91-104)/(54-67) 104/60 (05/13 0319) SpO2:  [90 %-100 %] 100 % (05/13 0319) FiO2 (%):  [40 %] 40 % (05/12 1553) Weight:  [83.1 kg] 83.1 kg (05/13 0319) Physical Exam: General: Awake, pleasant, in no distress, receiving breathing treatment Cardiovascular: Irregularly irregular rate and rhythm Respiratory: Decreased lung sounds diffusely, mild wheezing appreciated bilateral bases Extremities: 2+ pitting edema bilateral ankles, compression stockings in place  Laboratory: Recent Labs  Lab 09/26/20 0052 09/27/20 0048 09/28/20 0047  WBC 12.2* 9.5 10.0  HGB 8.2* 8.5* 8.4*  HCT 26.2* 26.6* 26.6*  PLT 453* 404* 403*   Recent Labs  Lab 09/26/20 0052 09/27/20 0048 09/28/20 0047  NA 131* 133* 132*  K 3.5 3.6 3.7  CL 95* 95* 95*  CO2 26 26 29   BUN 44* 40* 34*  CREATININE 1.00 1.17 0.96  CALCIUM 7.6*  7.7* 7.6* 7.5*  GLUCOSE 130* 142* 101*    Imaging/Diagnostic Tests: DG Chest Port 1 View  Result Date: 09/28/2020 CLINICAL DATA:  Shortness of breath, hypoxemia EXAM: PORTABLE CHEST 1 VIEW COMPARISON:  09/22/2020 FINDINGS: Cardiomegaly. Bilateral perihilar and lower lobe airspace opacities, worsening since prior study, right slightly greater than left. Suspect small effusions. No acute bony abnormality. IMPRESSION: Worsening bilateral airspace disease, right greater than left could reflect asymmetric edema or infection. Electronically Signed   By: Rolm Baptise M.D.   On: 09/28/2020 08:17     Sharion Settler, DO 09/28/2020, 6:57 AM PGY-1, [place] Intern pager: [phone], text pages welcome

## 2020-09-29 DIAGNOSIS — J9601 Acute respiratory failure with hypoxia: Secondary | ICD-10-CM | POA: Diagnosis not present

## 2020-09-29 LAB — CBC
HCT: 28.9 % — ABNORMAL LOW (ref 39.0–52.0)
Hemoglobin: 8.9 g/dL — ABNORMAL LOW (ref 13.0–17.0)
MCH: 27.6 pg (ref 26.0–34.0)
MCHC: 30.8 g/dL (ref 30.0–36.0)
MCV: 89.8 fL (ref 80.0–100.0)
Platelets: 423 10*3/uL — ABNORMAL HIGH (ref 150–400)
RBC: 3.22 MIL/uL — ABNORMAL LOW (ref 4.22–5.81)
RDW: 15.3 % (ref 11.5–15.5)
WBC: 9.4 10*3/uL (ref 4.0–10.5)
nRBC: 0.2 % (ref 0.0–0.2)

## 2020-09-29 LAB — GLUCOSE, CAPILLARY
Glucose-Capillary: 99 mg/dL (ref 70–99)
Glucose-Capillary: 275 mg/dL — ABNORMAL HIGH (ref 70–99)
Glucose-Capillary: 234 mg/dL — ABNORMAL HIGH (ref 70–99)
Glucose-Capillary: 179 mg/dL — ABNORMAL HIGH (ref 70–99)

## 2020-09-29 LAB — BASIC METABOLIC PANEL
Anion gap: 12 (ref 5–15)
BUN: 36 mg/dL — ABNORMAL HIGH (ref 8–23)
CO2: 26 mmol/L (ref 22–32)
Calcium: 7.6 mg/dL — ABNORMAL LOW (ref 8.9–10.3)
Chloride: 95 mmol/L — ABNORMAL LOW (ref 98–111)
Creatinine, Ser: 1 mg/dL (ref 0.61–1.24)
GFR, Estimated: >60 mL/min (ref >60)
Glucose, Bld: 182 mg/dL — ABNORMAL HIGH (ref 70–99)
Potassium: 4.4 mmol/L (ref 3.5–5.1)
Sodium: 133 mmol/L — ABNORMAL LOW (ref 135–145)

## 2020-09-29 MED ORDER — INSULIN GLARGINE 100 UNIT/ML ~~LOC~~ SOLN: 8.0000 [IU] | Freq: Every day | SUBCUTANEOUS | Status: DC

## 2020-09-29 MED ORDER — CALCIUM CARBONATE ANTACID 500 MG PO CHEW
1.0000 | CHEWABLE_TABLET | Freq: Once | ORAL | Status: AC
  Administered 2020-09-29: 400 mg via ORAL
  Filled 2020-09-29: qty 2

## 2020-09-29 MED ORDER — INSULIN GLARGINE 100 UNIT/ML ~~LOC~~ SOLN
6.0000 [IU] | Freq: Every day | SUBCUTANEOUS | Status: DC
  Administered 2020-09-30 – 2020-10-01 (×2): 6 [IU] via SUBCUTANEOUS
  Filled 2020-09-29 (×3): qty 0.06

## 2020-09-29 NOTE — Progress Notes (Signed)
Family Medicine Teaching Service Daily Progress Note Intern Pager: [phone]  Patient name: Gary [name] Medical record number: [id number] Date of birth: December 14, 1950 Age: 70 y.o. Gender: male  Primary Care Provider: Jalene Mullet, PA-C Consultants: Cardiology, Pulmonology Code Status: FULL  Pt Overview and Major Events to Date:  4/18 admitted 4/19 right/left heart cath with concern for pulmonary HTN 4/22 2u pRBC, intubated, EGD (nonbleeding duodenal ulcers), transferred to ICU 4/26 extubated, 1u pRBC 4/28 FPTS resumed care 5/3: 1200 cc serosanguinous fluid removed via thoracentesis 5/5: HR CT with spiculated mass in RML with hilar adenopathy; started on pulse dose steroids 5/9: Repeat RHC 5/10: Echo with bubble study 5/11: Weaned to 5L [place]   Assessment and Plan: Gary [name] a 70 y.o.malewhopresentedwith acute hypoxemic respiratory failure 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 PMHx significant for: HTN, T2DM, HLD, depression.  Acute Hypoxemic Respiratory Failure 2/2unknown etiology  mildPAH  Patient's breathing status appear stable. UOP 3.7L yesterday. Can likely start to work towards disposition given improved oxygen requirement (on 5L currently). Recommended for CIR.  -Cardiology following; appreciate recommendations             -Continue Torsemide 20 mg daily  -Pulmonology following; appreciation recommendations -Tapering steroids -Continue PPI for GI ppx -Repeat imaging of right middle lobe opacity once acute issues resolved             -Possible bronchoscopy in the future; would not tolerate now  -Continue Sildenafil 40 mg TID -Continue LABA/LAMA/ICS -Wean O2 as tolerated to maintain satsgreater than 90% -Pulmonary hygiene, mobilization -Continue CPAP nightly  Atrial Fibrillation Cardizem started on 5/12 and HR has  improved to 60's-70's. K 4.4 -Cardiology following, appreciate recommendations. -Diltiazem 120 mg daily  -Eliquis 36m BID -Continue Amiodarone 400 mg daily  -Goal K >4, Mg >2   Hypoalbuminemia  -Continue protein supplementation   AKI: Stable, improved Creatinine1.01.17>0.96. On daily Torsemide.Baseline appears to be around 0.9. -Monitor with BMP  [name]: Stable On triple therapy.Hemoglobin remains stable. -Continue triple therapy: Amoxicillin 1g BID, Clarithromycin 500 mg BID, Protonix 40 mg BID x14 days(5/2-5/15) -Will need TOCoutpatientafter treatment  Severe Protein Calorie Malnutrition -Continue nutritional supplementation  CAD: Chronic, stable -Atorvastatin 40 mg daily -Holding ASA  Type 2 DM: Chronic, stable CBG's66-237 in last 24 hours. Home medications include Metformin, pioglitazone and Glipizide.  -Monitor CBG's -Continue Lantus to 10units daily -mSSI -Continuemetformin 1000 mg BID -Continue10 mg Empagliflozin -HoldhomeGlipizideand pioglitazone  Hypertension with recent Hypotension BP's ranging94-110/57-70. -Continue Midodrine 10 mg TID  -Monitor BP's  FEN/GI:Heart Healthy PYQI:HKVQQVZ5 mg BID    Status is: Inpatient  Remains inpatient appropriate because:Ongoing diagnostic testing needed not appropriate for outpatient work up and Inpatient level of care appropriate due to severity of illness   Dispo: The patient is from: Home              Anticipated d/c is to: CIR              Patient currently is not medically stable to d/c.   Difficult to place patient No   Subjective:  No acute overnight events. Evaluated at bedside this morning.  Patient states he had a good night sleep and is in good spirits this morning and plan to walk in hallway.  He adds that he was able to get out of his bed on his own and is looking forward to go to rehab.  Objective: Temp:  [97.7 F (36.5  C)-98.1 F (36.7 C)] 97.7 F  (36.5 C) (05/14 0401) Pulse Rate:  [68-102] 68 (05/14 0401) Resp:  [17-23] 20 (05/14 0401) BP: (94-110)/(57-70) 110/64 (05/14 0401) SpO2:  [91 %-99 %] 98 % (05/14 0401) FiO2 (%):  [98 %] 98 % (05/13 2343) Weight:  [181 lb 3.5 oz (82.2 kg)] 181 lb 3.5 oz (82.2 kg) (05/14 0500) Physical Exam: General: Pleasant elderly man, no acute distress, receiving breathing treatment  Cardiovascular: Irregularly irregular rate and rhythm  Respiratory: Decreased lung sounds diffusely, mild wheezing + bilateral lower lung fields  Extremities: 2+ pitting edema bilateral ankles, compression stockings in place Psych: Good mood and affect  Laboratory: Recent Labs  Lab 09/27/20 0048 09/28/20 0047 09/29/20 0030  WBC 9.5 10.0 9.4  HGB 8.5* 8.4* 8.9*  HCT 26.6* 26.6* 28.9*  PLT 404* 403* 423*   Recent Labs  Lab 09/27/20 0048 09/28/20 0047 09/29/20 0030  NA 133* 132* 133*  K 3.6 3.7 4.4  CL 95* 95* 95*  CO2 26 29 26   BUN 40* 34* 36*  CREATININE 1.17 0.96 1.00  CALCIUM 7.6* 7.5* 7.6*  GLUCOSE 142* 101* 182*    Imaging/Diagnostic Tests: DG Chest Port 1 View  Result Date: 09/28/2020 CLINICAL DATA:  Shortness of breath, hypoxemia EXAM: PORTABLE CHEST 1 VIEW COMPARISON:  09/22/2020 FINDINGS: Cardiomegaly. Bilateral perihilar and lower lobe airspace opacities, worsening since prior study, right slightly greater than left. Suspect small effusions. No acute bony abnormality. IMPRESSION: Worsening bilateral airspace disease, right greater than left could reflect asymmetric edema or infection. Electronically Signed   By: Rolm Baptise M.D.   On: 09/28/2020 08:17     Gary [name], Gary Staggers, MD 09/29/2020, 5:48 AM PGY-1, [place] Intern pager: [phone], text pages welcome

## 2020-09-29 NOTE — Plan of Care (Signed)
  Problem: Education: Goal: Knowledge of General Education information will improve Description: Including pain rating scale, medication(s)/side effects and non-pharmacologic comfort measures Outcome: Progressing   Problem: Health Behavior/Discharge Planning: Goal: Ability to manage health-related needs will improve Outcome: Progressing   Problem: Clinical Measurements: Goal: Ability to maintain clinical measurements within normal limits will improve Outcome: Progressing Goal: Will remain free from infection Outcome: Progressing Goal: Diagnostic test results will improve Outcome: Progressing Goal: Respiratory complications will improve Outcome: Progressing Goal: Cardiovascular complication will be avoided Outcome: Progressing   Problem: Activity: Goal: Risk for activity intolerance will decrease Outcome: Progressing   Problem: Nutrition: Goal: Adequate nutrition will be maintained Outcome: Progressing   Problem: Coping: Goal: Level of anxiety will decrease Outcome: Progressing   Problem: Elimination: Goal: Will not experience complications related to bowel motility Outcome: Progressing Goal: Will not experience complications related to urinary retention Outcome: Progressing   Problem: Pain Managment: Goal: General experience of comfort will improve Outcome: Progressing   Problem: Safety: Goal: Ability to remain free from injury will improve Outcome: Progressing   Problem: Skin Integrity: Goal: Risk for impaired skin integrity will decrease Outcome: Progressing   Problem: Education: Goal: Ability to demonstrate management of disease process will improve Outcome: Progressing Goal: Ability to verbalize understanding of medication therapies will improve Outcome: Progressing Goal: Individualized Educational Video(s) Outcome: Progressing   Problem: Activity: Goal: Capacity to carry out activities will improve Outcome: Progressing   Problem: Cardiac: Goal:  Ability to achieve and maintain adequate cardiopulmonary perfusion will improve Outcome: Progressing   Problem: Activity: Goal: Ability to tolerate increased activity will improve Outcome: Progressing   Problem: Respiratory: Goal: Ability to maintain a clear airway and adequate ventilation will improve Outcome: Progressing   Problem: Role Relationship: Goal: Method of communication will improve Outcome: Progressing

## 2020-09-29 NOTE — Progress Notes (Signed)
RRT attempted to administer morning breathing treatments x1. Pt does not want to take at this moment, RRT will check back once more after first rounds.

## 2020-09-30 DIAGNOSIS — J9601 Acute respiratory failure with hypoxia: Secondary | ICD-10-CM | POA: Diagnosis not present

## 2020-09-30 LAB — CBC
HCT: 30.2 % — ABNORMAL LOW (ref 39.0–52.0)
Hemoglobin: 9.5 g/dL — ABNORMAL LOW (ref 13.0–17.0)
MCH: 27.9 pg (ref 26.0–34.0)
MCHC: 31.5 g/dL (ref 30.0–36.0)
MCV: 88.6 fL (ref 80.0–100.0)
Platelets: 449 10*3/uL — ABNORMAL HIGH (ref 150–400)
RBC: 3.41 MIL/uL — ABNORMAL LOW (ref 4.22–5.81)
RDW: 15.3 % (ref 11.5–15.5)
WBC: 11.5 10*3/uL — ABNORMAL HIGH (ref 4.0–10.5)
nRBC: 0 % (ref 0.0–0.2)

## 2020-09-30 LAB — BASIC METABOLIC PANEL
Anion gap: 10 (ref 5–15)
BUN: 31 mg/dL — ABNORMAL HIGH (ref 8–23)
CO2: 28 mmol/L (ref 22–32)
Calcium: 7.9 mg/dL — ABNORMAL LOW (ref 8.9–10.3)
Chloride: 96 mmol/L — ABNORMAL LOW (ref 98–111)
Creatinine, Ser: 0.94 mg/dL (ref 0.61–1.24)
GFR, Estimated: >60 mL/min (ref >60)
Glucose, Bld: 117 mg/dL — ABNORMAL HIGH (ref 70–99)
Potassium: 4 mmol/L (ref 3.5–5.1)
Sodium: 134 mmol/L — ABNORMAL LOW (ref 135–145)

## 2020-09-30 LAB — GLUCOSE, CAPILLARY
Glucose-Capillary: 137 mg/dL — ABNORMAL HIGH (ref 70–99)
Glucose-Capillary: 227 mg/dL — ABNORMAL HIGH (ref 70–99)
Glucose-Capillary: 176 mg/dL — ABNORMAL HIGH (ref 70–99)
Glucose-Capillary: 159 mg/dL — ABNORMAL HIGH (ref 70–99)

## 2020-09-30 NOTE — Progress Notes (Signed)
   Progress Note  Patient Name: Gary [name] Date of Encounter: 09/30/2020  Primary Cardiologist: Elouise Munroe, MD  Recent hospital course reviewed, also last rounding note by the advanced heart failure team on May 13.  From a cardiac perspective, he has had largely rate controlled atrial fibrillation/flutter on combination of amiodarone 200 mg daily and Cardizem CD 120 mg daily.  He is also on Eliquis 5 mg twice daily and tolerating this in terms of recent GI bleed, hemoglobin continues to trend up, recently 8.9 up to 9.5.  He has been clinically stable with plan to transition to CIR. Afebrile, recent heart rates in the 70s to 90s in probable atrial flutter with variable block by telemetry.  Systolic blood pressure 655-374 range.  Continues to have a net diuresis, approximately 1600 cc out last 24 hours.  Pertinent lab work includes potassium 4.0, BUN 31, creatinine 0.94, hemoglobin 9.5, platelets 449.  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  LVEF normal at 60% with severe RV dilatation and hypokinesis.  He does have distal RCA occlusion but otherwise mild coronary atherosclerosis.  Would plan to continue current dose of Demadex 20 mg daily, also sildenafil 40 mg 3 times daily and ProAmatine 10 mg 3 times daily for blood pressure support.  He is also on Jardiance 10 mg daily providing somewhat of a diuretic effect as well.  Rhythm is stable at this point on Cardizem CD 120 mg daily and amiodarone 200 mg daily, no changes for now.  Continue Eliquis.  Ultimately he may be considered for follow-up cardioversion as an outpatient once he recovers from his acute hospitalization.  Signed, Rozann Lesches, MD  09/30/2020, 8:10 AM

## 2020-09-30 NOTE — Plan of Care (Signed)

## 2020-09-30 NOTE — Progress Notes (Signed)
Family Medicine Teaching Service Daily Progress Note Intern Pager: [phone]  Patient name: Gary [name] Medical record number: [id number] Date of birth: 28-Jun-1950 Age: 70 y.o. Gender: male  Primary Care Provider: Jalene Mullet, PA-C Consultants: Cardiology, Pulmonology Code Status: FULL  Pt Overview and Major Events to Date:  4/18 admitted 4/19 right/left heart cath with concern for pulmonary HTN 4/22 2u pRBC, intubated, EGD (nonbleeding duodenal ulcers), transferred to ICU 4/26 extubated, 1u pRBC 4/28 FPTS resumed care 5/3: 1200 cc serosanguinous fluid removed via thoracentesis 5/5: HR CT with spiculated mass in RML with hilar adenopathy; started on pulse dose steroids 5/9: Repeat RHC 5/10: Echo with bubble study  5/11: Weaned to 5L [place]   Assessment and Plan: [name] a 70 y.o.malewhopresentedwith acute hypoxemic respiratory failure 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 PMHx significant for: HTN, T2DM, HLD, depression.  Acute Hypoxemic Respiratory Failure 2/2unknown etiology  mildPAH  Currently satting well on CPAP which he wore overnight.  Patient denies any complaints this morning. Appreciate pulmonology and cardiology's care and recommendations for this patient.  -Cardiology following; appreciate recommendations  -Continue Torsemide 20 mg daily  -Per notes patient is stable from a cardiology perspective to go to CIR -Pulmonology following; appreciation recommendations -Wean O2 as able  -Ambulate, pulmonary hygiene  -Ready for CIR from a pulmonology standpoint per notes  -Weaning steroids  -We will need to discharge on CPAP -Continue CPAP nightly   Atrial Fibrillation with RVR Heart rates 72-108 last 24 hours.  Potassium within normal limits - diltiazem 120 mg daily -Eliquis 36m BID -Continue Amiodarone 400 mg daily  -Goal K >4, Mg >2; will replete  Hypoalbuminemia  -Continue protein  supplementation  AKI: Stable, improved to baseline Creatinine1.24>1.0>1.17>0.96> 0.94. Continue daily torsemide.Baseline appears to be around 0.9. -Monitor with BMP  [name]: Stable On triple therapy. Hemoglobin remained stable, 9.5 today. -Continue triple therapy: Amoxicillin 1g BID, Clarithromycin 500 mg BID, Protonix 40 mg BID x14 days(5/2-5/15) -Will need TOCoutpatientafter treatment  Severe Protein Calorie Malnutrition -Continue nutritional supplementation  CAD: Chronic, stable -Atorvastatin 40 mg daily -Holding ASA  Type 2 DM: Chronic, stable CBG's  99-275in last 24 hours. Home medications include Metformin, pioglitazone and Glipizide.  -Monitor CBG's -Currently on Lantus 6 units daily -mSSI with HS  -Continuemetformin 1000 mg BID -Continue10 mg Empagliflozin -HoldhomeGlipizideand pioglitazone  Hypertension with recent Hypotension BP's normotensive last 24 hours -Continue Midodrine 10 mg TID  -Monitor BP's  FEN/GI:Heart Healthy PXEN:[id number] mg BID   Status is: Inpatient  Remains inpatient appropriate because:Patient now awaiting CIR   Dispo: The patient is from: Home              Anticipated d/c is to: CIR              Patient currently is medically stable to d/c to CIR   Difficult to place patient No  Subjective:  Patient denies any complaints this morning.  He is satting well on CPAP which he wore overnight.  Per neurology and pulmonology notes patient is stable for discharge to CIR when a bed is available from their standpoints.  Objective: Temp:  [97.7 F (36.5 C)-98.4 F (36.9 C)] 98.3 F (36.8 C) (05/14 2334) Pulse Rate:  [68-108] 95 (05/14 2334) Resp:  [20-22] 20 (05/14 2334) BP: (101-116)/(54-78) 112/70 (05/14 2334) SpO2:  [88 %-99 %] 95 % (05/14 2334) FiO2 (%):  [92 %] 92 % (05/14 0939) Weight:  [82.2  kg] 82.2 kg (05/14 0500) Physical Exam: General: Alert and oriented in no apparent  distress Heart: Irregular rhythm, no murmurs auscultated Lungs: Clear to auscultation bilaterally Abdomen: Bowel sounds present, no abdominal pain Skin: Warm and dry  Laboratory: Recent Labs  Lab 09/27/20 0048 09/28/20 0047 09/29/20 0030  WBC 9.5 10.0 9.4  HGB 8.5* 8.4* 8.9*  HCT 26.6* 26.6* 28.9*  PLT 404* 403* 423*   Recent Labs  Lab 09/27/20 0048 09/28/20 0047 09/29/20 0030  NA 133* 132* 133*  K 3.6 3.7 4.4  CL 95* 95* 95*  CO2 26 29 26   BUN 40* 34* 36*  CREATININE 1.17 0.96 1.00  CALCIUM 7.6* 7.5* 7.6*  GLUCOSE 142* 101* 182*    Imaging/Diagnostic Tests: No results found.   Lurline Del, DO 09/30/2020, 12:06 AM PGY-2, [place] Intern pager: [phone], text pages welcome

## 2020-09-30 NOTE — Plan of Care (Signed)
  Problem: Education: Goal: Knowledge of General Education information will improve Description: Including pain rating scale, medication(s)/side effects and non-pharmacologic comfort measures Outcome: Progressing   Problem: Health Behavior/Discharge Planning: Goal: Ability to manage health-related needs will improve Outcome: Progressing   Problem: Clinical Measurements: Goal: Ability to maintain clinical measurements within normal limits will improve Outcome: Progressing Goal: Will remain free from infection Outcome: Progressing Goal: Diagnostic test results will improve Outcome: Progressing Goal: Respiratory complications will improve Outcome: Progressing Goal: Cardiovascular complication will be avoided Outcome: Progressing   Problem: Activity: Goal: Risk for activity intolerance will decrease Outcome: Progressing   Problem: Nutrition: Goal: Adequate nutrition will be maintained Outcome: Progressing   Problem: Coping: Goal: Level of anxiety will decrease Outcome: Progressing   Problem: Elimination: Goal: Will not experience complications related to bowel motility Outcome: Progressing Goal: Will not experience complications related to urinary retention Outcome: Progressing   Problem: Pain Managment: Goal: General experience of comfort will improve Outcome: Progressing   Problem: Safety: Goal: Ability to remain free from injury will improve Outcome: Progressing   Problem: Skin Integrity: Goal: Risk for impaired skin integrity will decrease Outcome: Progressing   Problem: Education: Goal: Ability to demonstrate management of disease process will improve Outcome: Progressing Goal: Ability to verbalize understanding of medication therapies will improve Outcome: Progressing Goal: Individualized Educational Video(s) Outcome: Progressing   Problem: Activity: Goal: Capacity to carry out activities will improve Outcome: Progressing   Problem: Cardiac: Goal:  Ability to achieve and maintain adequate cardiopulmonary perfusion will improve Outcome: Progressing   Problem: Activity: Goal: Ability to tolerate increased activity will improve Outcome: Progressing   Problem: Respiratory: Goal: Ability to maintain a clear airway and adequate ventilation will improve Outcome: Progressing   Problem: Role Relationship: Goal: Method of communication will improve Outcome: Progressing

## 2020-09-30 NOTE — Progress Notes (Signed)
Placed patient on CPAP, via FFM, auto titrate settings. 5L O2 bleed in. Tolerating well at this time.

## 2020-10-01 DIAGNOSIS — J9601 Acute respiratory failure with hypoxia: Secondary | ICD-10-CM | POA: Diagnosis not present

## 2020-10-01 LAB — CBC
HCT: 28.8 % — ABNORMAL LOW (ref 39.0–52.0)
Hemoglobin: 9 g/dL — ABNORMAL LOW (ref 13.0–17.0)
MCH: 28.2 pg (ref 26.0–34.0)
MCHC: 31.3 g/dL (ref 30.0–36.0)
MCV: 90.3 fL (ref 80.0–100.0)
Platelets: 419 10*3/uL — ABNORMAL HIGH (ref 150–400)
RBC: 3.19 MIL/uL — ABNORMAL LOW (ref 4.22–5.81)
RDW: 15.6 % — ABNORMAL HIGH (ref 11.5–15.5)
WBC: 10.8 10*3/uL — ABNORMAL HIGH (ref 4.0–10.5)
nRBC: 0 % (ref 0.0–0.2)

## 2020-10-01 LAB — GLUCOSE, CAPILLARY
Glucose-Capillary: 175 mg/dL — ABNORMAL HIGH (ref 70–99)
Glucose-Capillary: 215 mg/dL — ABNORMAL HIGH (ref 70–99)
Glucose-Capillary: 184 mg/dL — ABNORMAL HIGH (ref 70–99)
Glucose-Capillary: 152 mg/dL — ABNORMAL HIGH (ref 70–99)

## 2020-10-01 LAB — BASIC METABOLIC PANEL
Anion gap: 10 (ref 5–15)
BUN: 31 mg/dL — ABNORMAL HIGH (ref 8–23)
CO2: 29 mmol/L (ref 22–32)
Calcium: 7.7 mg/dL — ABNORMAL LOW (ref 8.9–10.3)
Chloride: 93 mmol/L — ABNORMAL LOW (ref 98–111)
Creatinine, Ser: 1.11 mg/dL (ref 0.61–1.24)
GFR, Estimated: >60 mL/min (ref >60)
Glucose, Bld: 155 mg/dL — ABNORMAL HIGH (ref 70–99)
Potassium: 4.1 mmol/L (ref 3.5–5.1)
Sodium: 132 mmol/L — ABNORMAL LOW (ref 135–145)

## 2020-10-01 LAB — MAGNESIUM: Magnesium: 1.5 mg/dL — ABNORMAL LOW (ref 1.7–2.4)

## 2020-10-01 MED ORDER — MAGNESIUM SULFATE 2 GM/50ML IV SOLN
2.0000 g | Freq: Once | INTRAVENOUS | Status: AC
Start: 2020-10-01 — End: 2020-10-01
  Administered 2020-10-01: 2 g via INTRAVENOUS
  Filled 2020-10-01: qty 50

## 2020-10-01 MED ORDER — PANTOPRAZOLE SODIUM 20 MG PO TBEC
20.0000 mg | DELAYED_RELEASE_TABLET | Freq: Every day | ORAL | Status: DC
  Filled 2020-10-01: qty 1

## 2020-10-01 MED ORDER — FAMOTIDINE 20 MG PO TABS
20.0000 mg | ORAL_TABLET | Freq: Every day | ORAL | Status: DC
  Administered 2020-10-01: 20 mg via ORAL
  Filled 2020-10-01: qty 1

## 2020-10-01 MED ORDER — PANTOPRAZOLE SODIUM 40 MG PO TBEC
40.0000 mg | DELAYED_RELEASE_TABLET | Freq: Every day | ORAL | Status: DC
  Administered 2020-10-01 – 2020-10-02 (×2): 40 mg via ORAL
  Filled 2020-10-01 (×2): qty 1

## 2020-10-01 MED ORDER — DILTIAZEM HCL ER COATED BEADS 180 MG PO CP24
180.0000 mg | ORAL_CAPSULE | Freq: Every day | ORAL | Status: DC
  Administered 2020-10-02: 180 mg via ORAL
  Filled 2020-10-01: qty 1

## 2020-10-01 NOTE — Progress Notes (Signed)
Occupational Therapy Treatment Patient Details Name: Gary [name] MRN: [id number] DOB: [birth year] Today's Date: 10/01/2020    History of present illness Patient is a 70 y/o male who presents on 09/03/20 with progressive SOB. Found to have acute respiratory failure, likely due to new onset of heart failure and A-fib with RVR. CXR with R-side effusion, prominent interstitial edema/disease. S/p cardiac cath 4/19. Pt with hypotension 4/21 and drop in hgb on 4/22. Workup for GIB with intubation and urgent bedside EGD on 4/22 with PT sign off. Extubated on 4/26. PT reordered on 4/27. Cardiac cath 5/9 with no significant findings. Weaned off HHFNC to 5L HFNC on 5/11. PMH includes HTN, DM.   OT comments  Pt progressing well towards OT goals, remains motivated to regain independence and return home. Pt overall Supervision for mobility to/from bathroom and toileting task with RW, no overt LOB. Provided energy conservation handout and reinforced strategies to implement during ADLs/IADLs at home. Pt completed all in-room activity on 6 L O2 with desats to 82% with quick recovery to 90% after seated rest break. Pt would benefit from continued ADL practice, as well as introduction of IADL endurance strategies with rehab services.    Follow Up Recommendations  CIR    Equipment Recommendations  3 in 1 bedside commode    Recommendations for Other Services Rehab consult    Precautions / Restrictions Precautions Precautions: Fall;Other (comment) Precaution Comments: Watch SpO2 Restrictions Weight Bearing Restrictions: No       Mobility Bed Mobility               General bed mobility comments: in chair on arrival and end of session    Transfers Overall transfer level: Modified independent Equipment used: Rolling walker (2 wheeled) Transfers: Sit to/from Stand Sit to Stand: Modified independent (Device/Increase time)              Balance Overall balance assessment: Needs  assistance Sitting-balance support: No upper extremity supported;Feet supported Sitting balance-Leahy Scale: Good     Standing balance support: Bilateral upper extremity supported Standing balance-Leahy Scale: Fair Standing balance comment: fair static standing, use of B UE support for mobility                           ADL either performed or assessed with clinical judgement   ADL Overall ADL's : Needs assistance/impaired                         Toilet Transfer: Supervision/safety;Ambulation;RW;Regular Toilet   Toileting- Water quality scientist and Hygiene: Supervision/safety;Sit to/from stand Toileting - Clothing Manipulation Details (indicate cue type and reason): completed urination task in standing with RW. good problem solving manuevering of RW in bathroom     Functional mobility during ADLs: Supervision/safety;Rolling walker General ADL Comments: Pt with continued deficits in cardiopulmonary tolerance though motivated to return home. Improving balance with use of RW and monitoring of controlled breathing throughout activity. Provided energy conservation handout and reinforced strategies to use at home. Discussed progression of IADL tasks once BADLs mastered     Vision   Vision Assessment?: No apparent visual deficits   Perception     Praxis      Cognition Arousal/Alertness: Awake/alert Behavior During Therapy: WFL for tasks assessed/performed Overall Cognitive Status: Within Functional Limits for tasks assessed  Exercises General Exercises - Lower Extremity Long Arc Quad: Both;Seated;Strengthening;AROM;20 reps Hip Flexion/Marching: Both;Seated;Strengthening;AROM;20 reps   Shoulder Instructions       General Comments recieved on 5 L O2, 94%. completed mobility to/from bathroom on 6 L (tank limitations) with desat to 82%, recovers within 1.5 min of seated rest break to 90%. Returned to 5  L o2.    Pertinent Vitals/ Pain       Pain Assessment: No/denies pain Pain Score: 4  Pain Location: rt knee Pain Descriptors / Indicators: Aching;Sore Pain Intervention(s): Limited activity within patient's tolerance;Monitored during session;Repositioned  Home Living                                          Prior Functioning/Environment              Frequency  Min 2X/week        Progress Toward Goals  OT Goals(current goals can now be found in the care plan section)  Progress towards OT goals: Progressing toward goals  Acute Rehab OT Goals Patient Stated Goal: Get stronger and return home more independent OT Goal Formulation: With patient Time For Goal Achievement: 10/05/20 Potential to Achieve Goals: Good ADL Goals Pt Will Perform Grooming: with set-up;sitting;standing Pt Will Perform Upper Body Bathing: with set-up;sitting Pt Will Perform Lower Body Bathing: with min assist;sit to/from stand Pt Will Perform Upper Body Dressing: with set-up;sitting Pt Will Perform Lower Body Dressing: with min assist;with adaptive equipment;sit to/from stand Pt Will Transfer to Toilet: with min guard assist;stand pivot transfer;bedside commode Pt Will Perform Toileting - Clothing Manipulation and hygiene: with min guard assist;sit to/from stand  Plan Discharge plan remains appropriate    Co-evaluation                 AM-PAC OT "6 Clicks" Daily Activity     Outcome Measure   Help from another person eating meals?: None Help from another person taking care of personal grooming?: None Help from another person toileting, which includes using toliet, bedpan, or urinal?: A Little Help from another person bathing (including washing, rinsing, drying)?: A Little Help from another person to put on and taking off regular upper body clothing?: None Help from another person to put on and taking off regular lower body clothing?: A Little 6 Click Score: 21    End  of Session Equipment Utilized During Treatment: Rolling walker;Oxygen  OT Visit Diagnosis: Unsteadiness on feet (R26.81);Muscle weakness (generalized) (M62.81) Pain - Right/Left: Right Pain - part of body: Knee   Activity Tolerance Patient tolerated treatment well   Patient Left in chair;with call bell/phone within reach;with nursing/sitter in room;with family/visitor present   Nurse Communication Mobility status;Other (comment) (O2)        Time: 8563-1497 OT Time Calculation (min): 22 min  Charges: OT General Charges $OT Visit: 1 Visit OT Treatments $Self Care/Home Management : 8-22 mins  Malachy Chamber, OTR/L Acute Rehab Services Office: [phone]   Layla Maw 10/01/2020, 12:20 PM

## 2020-10-01 NOTE — Progress Notes (Signed)
Physical Therapy Treatment Patient Details Name: Gary [name] MRN: [id number] DOB: 1951/04/20 Today's Date: 10/01/2020    History of Present Illness Patient is a 70 y/o male who presents on 09/03/20 with progressive SOB. Found to have acute respiratory failure, likely due to new onset of heart failure and A-fib with RVR. CXR with R-side effusion, prominent interstitial edema/disease. S/p cardiac cath 4/19. Pt with hypotension 4/21 and drop in hgb on 4/22. Workup for GIB with intubation and urgent bedside EGD on 4/22 with PT sign off. Extubated on 4/26. PT reordered on 4/27. Cardiac cath 5/9 with no significant findings. Weaned off HHFNC to 5L HFNC on 5/11. PMH includes HTN, DM.    PT Comments    Pt pleasant and eager to progress mobility. On arrival pt on 5L with SpO2 88% with bump to 8L for activity with SpO2 83-91% with specifics under gait. Pt return to 5L for HEP with SpO2 85-90% during HEP with cues for sequence, breathing technique and progression. Pt moving further off [place] but tolerating max of 50' with 23 min seated rest. Will continue to follow.     Follow Up Recommendations  CIR;Supervision for mobility/OOB     Equipment Recommendations       Recommendations for Other Services       Precautions / Restrictions Precautions Precautions: Fall;Other (comment) Precaution Comments: Watch SpO2    Mobility  Bed Mobility               General bed mobility comments: in chair on arrival and end of session    Transfers Overall transfer level: Modified independent                  Ambulation/Gait Ambulation/Gait assistance: Min guard Gait Distance (Feet): 50 Feet Assistive device: Rolling walker (2 wheeled) Gait Pattern/deviations: Step-through pattern;Decreased stride length   Gait velocity interpretation: <1.8 ft/sec, indicate of risk for recurrent falls General Gait Details: cues for safety, proximity to RW, not talking and walking and energy conservation. Pt  able to walk 50' on 8L x 3 trials with 2-3 min seated rest between trial. 1st trial pt talking despite cues and sats 84-88% during gait, 2nd trial no talking with SpO2 90% during gait but drop to 85% at rest with 2 min recovery back to 90%. 3rd trial SpO2 88-92% during gait without talking with 2.5 min recovery once seated due to drop to 84% and recovery to 92%   Stairs             Wheelchair Mobility    Modified Rankin (Stroke Patients Only)       Balance Overall balance assessment: Needs assistance   Sitting balance-Leahy Scale: Good     Standing balance support: Bilateral upper extremity supported Standing balance-Leahy Scale: Poor Standing balance comment: bil UE support on Rw for gait                            Cognition Arousal/Alertness: Awake/alert Behavior During Therapy: WFL for tasks assessed/performed Overall Cognitive Status: Within Functional Limits for tasks assessed                                        Exercises General Exercises - Lower Extremity Long Arc Quad: Both;Seated;Strengthening;AROM;20 reps Hip Flexion/Marching: Both;Seated;Strengthening;AROM;20 reps    General Comments        Pertinent Vitals/Pain Pain  Score: 4  Pain Location: rt knee Pain Descriptors / Indicators: Aching;Sore Pain Intervention(s): Limited activity within patient's tolerance;Monitored during session;Repositioned    Home Living                      Prior Function            PT Goals (current goals can now be found in the care plan section) Progress towards PT goals: Progressing toward goals    Frequency    Min 3X/week      PT Plan Current plan remains appropriate    Co-evaluation              AM-PAC PT "6 Clicks" Mobility   Outcome Measure  Help needed turning from your back to your side while in a flat bed without using bedrails?: None Help needed moving from lying on your back to sitting on the side of a  flat bed without using bedrails?: A Little Help needed moving to and from a bed to a chair (including a wheelchair)?: A Little Help needed standing up from a chair using your arms (e.g., wheelchair or bedside chair)?: A Little Help needed to walk in hospital room?: A Little Help needed climbing 3-5 steps with a railing? : A Lot 6 Click Score: 18    End of Session Equipment Utilized During Treatment: Oxygen Activity Tolerance: Patient tolerated treatment well Patient left: in chair;with call bell/phone within reach Nurse Communication: Mobility status PT Visit Diagnosis: Muscle weakness (generalized) (M62.81);Difficulty in walking, not elsewhere classified (R26.2);Other (comment)     Time: 3212-2482 PT Time Calculation (min) (ACUTE ONLY): 37 min  Charges:  $Gait Training: 8-22 mins $Therapeutic Exercise: 8-22 mins                     Naiomi Musto P, PT Acute Rehabilitation Services Pager: [phone] Office: [place] 10/01/2020, 11:42 AM

## 2020-10-01 NOTE — Progress Notes (Signed)
Inpatient Rehab Admissions Coordinator:   I have no beds available for this patient to admit to CIR today.  Will continue to follow for timing of potential admission pending bed availability.   Shann Medal, PT, DPT Admissions Coordinator [phone] 10/01/20  10:47 AM

## 2020-10-01 NOTE — Progress Notes (Addendum)
Patient ID: Gary [name], male   DOB: 04-01-1951, 70 y.o.   MRN: [id number]     Advanced Heart Failure Rounding Note  PCP-Cardiologist: Elouise Munroe, MD   Subjective:   Events  4/22  EGD 4/22 showed 2 ulcers in duodenum that were not actively bleeding.  On PO Protonix, Hgb 10>9.3>8.6  4/23 Intubated. Dobutamine stopped due to marked tachycardia. Started antibiotics for pneumonia. Blood CX- NGTD. Sputum with strep.  4/24 Did not tolerate fast veletri wean.  4/25 Veletri stopped. Extubated. Given 1u PRBC  5/1 Amio gtt stopped>>PO 400 mg daily  5/3 Right thoracentesis with 1200cc out (mostly transudative) 5/5 In A fib . High Res CT- redemonstrated spiculated mass anterior RML with trace left pleural effusion.  5/5 Started on pulse-dose steroids 5/9 RHC mild pulmonary htn, normal left sided filling pressures, high cardiac output.  5/10 Bubble Study Negative   Stable over the weekend. Remains in AF, 80s-90s currently. Back on Eliquis Hgb stable at 9.0.     On 5L [place]. He notes significant subjective improvement and increase in exercise tolerance. Waiting on CIR bed.   Objective:   Weight Range: 82.2 kg Body mass index is 28.38 kg/m.   Vital Signs:   Temp:  [98.1 F (36.7 C)-98.7 F (37.1 C)] 98.7 F (37.1 C) (05/16 0419) Pulse Rate:  [70-86] 72 (05/16 0419) Resp:  [17-20] 17 (05/16 0419) BP: (98-111)/(58-70) 109/64 (05/16 0419) SpO2:  [90 %-97 %] 90 % (05/16 0910) Weight:  [82.2 kg] 82.2 kg (05/16 0500) Last BM Date: 09/30/20  Weight change: Filed Weights   09/29/20 0500 09/30/20 0500 10/01/20 0500  Weight: 82.2 kg 82.5 kg 82.2 kg    Intake/Output:   Intake/Output Summary (Last 24 hours) at 10/01/2020 0923 Last data filed at 10/01/2020 0420 Gross per 24 hour  Intake 714 ml  Output 3700 ml  Net -2986 ml      Physical Exam   PHYSICAL EXAM: General:  Well appearing. No respiratory difficulty HEENT: normal Neck: supple. no JVD. Carotids 2+ bilat; no bruits. No  lymphadenopathy or thyromegaly appreciated. Cor: PMI nondisplaced. irregularly irregular rhythm and rate. No rubs, gallops or murmurs. Lungs: decreased BS at the bases, no wheezing  Abdomen: soft, nontender, nondistended. No hepatosplenomegaly. No bruits or masses. Good bowel sounds. Extremities: no cyanosis, clubbing, rash, edema Neuro: alert & oriented x 3, cranial nerves grossly intact. moves all 4 extremities w/o difficulty. Affect pleasant.   Telemetry    A fib 80s-90s Personally reviewed  Labs    CBC Recent Labs    09/30/20 0126 10/01/20 0044  WBC 11.5* 10.8*  HGB 9.5* 9.0*  HCT 30.2* 28.8*  MCV 88.6 90.3  PLT 449* 448*   Basic Metabolic Panel Recent Labs    09/30/20 0126 10/01/20 0044  NA 134* 132*  K 4.0 4.1  CL 96* 93*  CO2 28 29  GLUCOSE 117* 155*  BUN 31* 31*  CREATININE 0.94 1.11  CALCIUM 7.9* 7.7*   Liver Function Tests No results for input(s): AST, ALT, ALKPHOS, BILITOT, PROT, ALBUMIN in the last 72 hours. No results for input(s): LIPASE, AMYLASE in the last 72 hours. Cardiac Enzymes No results for input(s): CKTOTAL, CKMB, CKMBINDEX, TROPONINI in the last 72 hours.  BNP: BNP (last 3 results) Recent Labs    09/03/20 1433 09/20/20 0047  BNP 1,202.7* 360.6*    ProBNP (last 3 results) No results for input(s): PROBNP in the last 8760 hours.   D-Dimer No results for input(s): DDIMER in the  last 72 hours. Hemoglobin A1C No results for input(s): HGBA1C in the last 72 hours. Fasting Lipid Panel No results for input(s): CHOL, HDL, LDLCALC, TRIG, CHOLHDL, LDLDIRECT in the last 72 hours. Thyroid Function Tests No results for input(s): TSH, T4TOTAL, T3FREE, THYROIDAB in the last 72 hours.  Invalid input(s): FREET3  Other results:   Imaging    No results found.   Medications:     Scheduled Medications: . sodium chloride   Intravenous Once  . amiodarone  200 mg Oral Daily  . apixaban  5 mg Oral BID  . arformoterol  15 mcg  Nebulization BID  . atorvastatin  40 mg Oral Daily  . budesonide  0.5 mg Nebulization BID  . chlorhexidine  15 mL Mouth Rinse BID  . diltiazem  120 mg Oral Daily  . empagliflozin  10 mg Oral Daily  . famotidine  20 mg Oral Daily  . feeding supplement (GLUCERNA SHAKE)  237 mL Oral TID BM  . ferrous sulfate  325 mg Oral Q breakfast  . insulin aspart  0-15 Units Subcutaneous TID WC  . insulin glargine  6 Units Subcutaneous Daily  . mouth rinse  15 mL Mouth Rinse q12n4p  . metFORMIN  1,000 mg Oral BID WC  . midodrine  10 mg Oral TID WC  . multivitamin with minerals  1 tablet Oral Daily  . [START ON 10/02/2020] predniSONE  5 mg Oral Q breakfast  . revefenacin  175 mcg Nebulization Daily  . saline  1 application Each Nare BID  . sildenafil  40 mg Oral TID  . sodium chloride flush  10-40 mL Intracatheter Q12H  . sodium chloride flush  3 mL Intravenous Q12H  . torsemide  20 mg Oral Daily    Infusions: . sodium chloride Stopped (09/10/20 1336)  . sodium chloride Stopped (09/24/20 0700)    PRN Medications: sodium chloride, acetaminophen, guaiFENesin-dextromethorphan, ondansetron (ZOFRAN) IV, polyethylene glycol, sodium chloride    Assessment/Plan    1. Acute upper GI bleed with symptomatic anemia - hgb dropped to 6.7. Transfused.->  Eliquis held  - CT negative for RP bleed - EGD with 2 ulcers not acutely bleeding.  - On protonix PO 40 mg bid  - Off  AC for 2 weeks to allow GI healing - Biopsy back for H pylori. On treatment per GI - Eliquis restarted 5/12 -> Hgb stable at 9.0  2. PAH with cor pulmonale - CT chest 4/22: No PE or ILD - Echo LVEF 60% severe RV dilation and HK - Cath with moderate PAH. PA = 66/31 (46) PCW = 13 Fick = 4.1/2.1 PVR = 7.8 WU - Suspect this is WHO Group 3 PAH due to hypoxic lung disease/OHS/OSA - Will order PFTs (when better compensated) and will need outpatient sleep study - Serologies for completed. Note abnormal ANA, with borderline elevation in  chromatin antibody, but all other serologies negative. ANCA pendingESR only mildly abnormal at 44 - repeat RHC 09/24/20 PA 47/23 (32) PCW 14 - Continue sildenafil 40 mg tid. On midodrine 10 mg three times a day for BP support  - Volume status stable.  - Continue jardiance 10 mg daily and torsemide 20 daily   3. Paroxysmal AF - new onset. Unclear duration  - Eliquis on hold d/t GI bleed - Was in NSR 2 weeks ago --> back in AF - Off AC with recent large GI bleed - Continue SCDs.  - Continue PO amio 200 mg daily . - Eliquis restarted 5/12. Consider  repeat DCCV after 3 weeks a/c -  May be candidate for Watchman LAA closure if we can get him stable on [facility] for 1-2 months. Would avoid long-term amio use given underlying lung disease if at all possible.  - Rates improved on cardizem 120    4. CAD  -mostly non-obstructive - Continue atorvastatin 40 mg daily.  - No ASA with AC - No s/s ischemia   5. R lung mass - High Res CT- redemonstrated spiculated mass anterior RML with trace left pleural effusion.   6. Shock - Suspect mixed hemorrhagic/cardiogenic (RV failure).   - Resolved  7. Acute hypoxemic respiratory failure with bilateral pleural effusions - Intubated pre-EGD on 4/22.   - Extubated--> on HFNC-- very slow wean. On 30 L + 40%  - Respiratory status remains tenuous. As noted before, degree of hypoxemia far out of proportion to HF or PAH. Continue with aggressive pulmonary toilet and mobilization. - With response to O2 supplementation shunt physiology less likely.  -s/p R thora with 1200 cc out. (mostly transudative)  - High Res CT- redemonstrated spiculated mass anterior RML with trace left pleural effusion. - RHC 5/9 w/ mild pulmonary htn, normal left sided filling pressures, high cardiac output. - ? PVOD but would suspect pulmonary pressures to be higher - Echo w/o evidence of peripheral shunting  - Will need PFTs as an outpatient  - Has improved gradually.  - Continue  torsemide 20 daily to keep dry  8. Deconditioning - Needs CIR. He is ready for transfer from cardiac perspective if/when bed available.    Length of Stay: [address] PA-C  10/01/2020, 9:23 AM  Advanced Heart Failure Team Pager [phone] (M-F; 7a - 5p)  Please contact [facility] for night-coverage after hours (5p -7a ) and weekends on [email or web address]  Patient seen and examined with the above-signed Advanced Practice Provider and/or Housestaff. I personally reviewed laboratory data, imaging studies and relevant notes. I independently examined the patient and formulated the important aspects of the plan. I have edited the note to reflect any of my changes or salient points. I have personally discussed the plan with the patient and/or family.  Respiratory status continues to improve. AF rate stable. Tolerating Eliquis   General:  Sitting in chair No resp difficulty HEENT: normal Neck: supple. no JVD. Carotids 2+ bilat; no bruits. No lymphadenopathy or thryomegaly appreciated. Cor: PMI nondisplaced. Irregular rate & rhythm. No rubs, gallops or murmurs. Lungs: coarse Abdomen: soft, nontender, nondistended. No hepatosplenomegaly. No bruits or masses. Good bowel sounds. Extremities: no cyanosis, clubbing, rash, edema Neuro: alert & orientedx3, cranial nerves grossly intact. moves all 4 extremities w/o difficulty. Affect pleasant  Remains stable from cardiac perspective. Will continue current regimen. Plan DC-CV and possible Watchman eval in 4 weeks.   We will follow at a distance.   Glori Bickers, MD  10:20 AM

## 2020-10-01 NOTE — Progress Notes (Signed)
NAME:  Gary [name], MRN:  [id number], DOB:  07-Jun-1950, LOS: 67 ADMISSION DATE:  09/03/2020, CONSULTATION DATE:  4/22 REFERRING MD:  Nori Riis, CHIEF COMPLAINT:  Dyspnea   History of Present Illness:  70 y/o male admitted with dyspnea and new oxygen requirements.  Found to have pulmonary hypertension. Hospitalization complicated by GI bleeding, went to the ICU and required intubation in setting of hemorrhagic and cardiogenic shock. Treated with dobutuamine and iloprost while intubated.  Extubated after 4 days.  Since extubation pulmonary has been following for persistently elevated oxygen requirements.    Pertinent  Medical History  Former smoker HTN DM Traumatic crush injury from a tree branch with multiple fractures Epistaxis requiring packing   Significant Hospital Events: Including procedures, antibiotic start and stop dates in addition to other pertinent events    4/18 admitted with SOB, echo with elevated right heart pressures, new finding of A. Fib  4/18 CT angiogram chest > small bilateral effusions, patchy infiltrates R lung, RML opacity, no PE  4/19 R/L heart cath with concern for pulmonary hypertension; Mean PA pressure 46, PCWP 13, PVR 7.8 WU  4/20 on 15L HFNC  4/21 hypotensive episode, responded to IVF, down to 5L O2  4/22 ABLA, to ICU, intubated, severe hypoxemia; EGD with nonbleeding duodenal ulcer  4/23 failed veletri wean  4/26 veletri weaned and pt extubated  4/27 transferred Out of ICU  4/29 PCCM re-consulted for recommendations regarding ongoing hypoxia and high oxygen demand. CCM ordering CXR, CTA chest  4/29 CT angiogram > no PE, progresive bilatearl perihilar airspace disease, worse in upper lobes, nodular consolidation in RML, R>L bilateral effusions . 5/3 R thoracentesis> 1.2 L fluid removed, exudate (1/3 light's criteria; pleural fluid LDH is > 2/3 upper limit of normal serum LDH, albumin gradient < 1), lymphocytic predominant . 09/20/2020 initiation of  steroids . 5/5 high-resolution CT chest redemonstrate spiculated mass anterior right middle lobe 3.4x1.7, small trace left pleural effusion; started on pulse-dose steroids . 5/7 down to 15L/ 40% . 5/8 on 22L/ 50%, needing more O2 at night . 09/24/20  repeat RHC : PA 47/23 (32) PCW 14 . 5/13 down to 5L [place].   Interim History / Subjective:  Feeling better.  Really enjoys the CPAP getting excellent sleep.  No distress.  Currently 5 L per nasal cannula improving daily  Objective   Blood pressure 101/61, pulse 72, temperature 98.7 F (37.1 C), temperature source Oral, resp. rate 17, height 5' 7"  (1.702 m), weight 82.2 kg, SpO2 90 %.        Intake/Output Summary (Last 24 hours) at 10/01/2020 1119 Last data filed at 10/01/2020 6808 Gross per 24 hour  Intake 477 ml  Output 3750 ml  Net -3273 ml   Filed Weights   09/29/20 0500 09/30/20 0500 10/01/20 0500  Weight: 82.2 kg 82.5 kg 82.2 kg    Examination:  General this is a pleasant 70 year old white male he is currently sitting up in the chair he is in no acute distress and this is following physical therapy HEENT normocephalic atraumatic no jugular venous distention is appreciated his mucous membranes are moist Pulmonary crackles both bases a little more diminished in the right base currently no accessory use able to speak in full sentences currently 5 L per nasal cannula Cardiac regular rate and rhythm no murmur rub or gallop appreciated Abdomen is soft, not tender, no organomegaly Extremities are warm and dry with brisk capillary refill Neuro awake oriented no focal deficit   Labs/imaging that  I havepersonally reviewed  (right click and "Reselect all SmartList Selections" daily)   Per below  Resolved Hospital Problem list     Assessment & Plan:   Acute respiratory failure with hypoxemia in setting of pulmonary hypertension and bilateral pulmonary infiltrates with R>L pleural effusion R persistent pleural effusion, left effusion  resolved. WHO group 3 pulmonary hypertension Atrial fibrillation  Barret's esophagitis Gastritis due to H. 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 CAD, nonobstructive. Acute blood loss anemia and hemorrhagic shock  Pulm dx Acute respiratory failure with hypoxemia in setting of pulmonary hypertension and bilateral pulmonary infiltrates with R>L pleural effusion R>L exudative pleural effusions, postthoracentesis WHO group 3 pulm hypertension Occupational smoke exposure, COPD Right middle lobe nodule, question malignancy  Discussion Presumed secondary pulmonary hypertension due to pulmonary disease.  He has had improvement in his pulmonary infiltrates with aggressive diuresis, also improved PASP & mean PAP with diuresis and the addition of sildenafil.  No evidence of thromboembolic disease.  We have treated him with corticosteroids in the event that some of his interstitial infiltrates were inflammatory (autoimmune work-up unrevealing), now tapering.  He is also on therapy for presumed obstructive lung disease (will need PFT as an outpatient).  He has a residual right middle lobe opacity that is masslike in appearance, concerning for possible primary lung malignancy although no evidence for lymphangitic process that would contribute to hypoxemia on imaging. -> At this point seemingly a mix of all of the above.  The residual question being really what is the etiology of the right middle lobe opacification  Plan/recs Continue slow steroid taper Continue to wean oxygen as able Continue daily diuretics Continue scheduled sildenafil and midodrine He is currently on auto titration CPAP, he is doing well with this and tolerating it much better than BiPAP previously used, he needs to be discharged home with this however that may be challenging, as often folks need polysomnogram before they can Clementeen Graham, DO 10/01/20 11:19 AM [facility] qualify, also  difficult obtaining the actual machines currently due to national backorder Prior to discharge she needs arterial blood gas, to look for hypercarbia, might change our recommendations to use BiPAP instead of CPAP After discharge he will need follow-up CT imaging specifically to evaluate the right middle lobe opacity.  If still present, and oxygen requirements improved we would need to consider whether or not proceeding with bronchoscopy would be warranted to rule out possible malignancy he will need to follow-up with either Dr. Valeta Harms or Dr. Lamonte Sakai

## 2020-10-01 NOTE — Progress Notes (Signed)
Family Medicine Teaching Service Daily Progress Note Intern Pager: [phone]  Patient name: Gary [name] Medical record number: [id number] Date of birth: 1951/03/10 Age: 70 y.o. Gender: male  Primary Care Provider: Jalene Mullet, PA-C Consultants: Cardiology, Pulmonology Code Status: FULL  Pt Overview and Major Events to Date:  4/18 admitted 4/19 right/left heart cath with concern for pulmonary HTN 4/22 2u pRBC, intubated, EGD (nonbleeding duodenal ulcers), transferred to ICU 4/26 extubated, 1u pRBC 4/28 FPTS resumed care 5/3: 1200 cc serosanguinous fluid removed via thoracentesis 5/5: HR CT with spiculated mass in RML with hilar adenopathy; started on pulse dose steroids 5/9: Repeat RHC 5/10: Echo with bubble study  5/11: Weaned to 5L [place]   Assessment and Plan: Gary [name] a 70 y.o.malewhopresentedwith acute hypoxemic respiratory failure 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 PMHx significant for: HTN, T2DM, HLD, depression.  Acute Hypoxemic Respiratory Failure 2/2unknown etiology  mildPAH  Patient is satting well on CPAP, tolerating well overnight and on 5L O2 this AM. Appreciate pulmonology and cardiology's care and recommendations for this patient.  -Cardiology following; appreciate recommendations  -Continue Torsemide 20 mg daily  -Per notes patient is stable from a cardiology perspective to go to CIR -Pulmonology following; appreciation recommendations -Wean O2 as able  -Ambulate, pulmonary hygiene  -Ready for CIR from a pulmonology standpoint per notes  -Weaning steroids  -We will need to discharge on CPAP -Continue CPAP nightly  Atrial Fibrillation with RVR Heart rates 70-97.  Potassium within normal limits 4.1. Mag 1.5 today. -Mag sulphate 2g IV once -f/u Mag tom AM - diltiazem 120 mg daily -Eliquis 47m BID -Continue Amiodarone 400 mg daily  -Goal K >4, Mg >2; will  replete  Hypoalbuminemia  -Continue protein supplementation  AKI: Stable, improved to baseline Creatinine1.24>1.0>1.17>0.96> 0.94>1.11. Continue daily torsemide.Baseline appears to be around 0.9. -Monitor with BMP  [name]: Stable On triple therapy. Hemoglobin remained stable, 9.0 today. -Completed  triple therapy: Amoxicillin 1g BID, Clarithromycin 500 mg BID on 5/15  -Continue Protonix 40 mg once daily  Severe Protein Calorie Malnutrition -Continue nutritional supplementation  CAD: Chronic, stable -Atorvastatin 40 mg daily -Holding ASA  Type 2 DM: Chronic, stable CBG's  137-227in last 24 hours.fasting 175 in AM. Home medications include Metformin, pioglitazone and Glipizide.  -Monitor CBG's -Currently on Lantus 6 units daily -mSSI with HS  -Continuemetformin 1000 mg BID -Continue10 mg Empagliflozin -HoldhomeGlipizideand pioglitazone  Hypertension with recent Hypotension BP's normotensive last 24 hours -Continue Midodrine 10 mg TID  -Monitor BP's  FEN/GI:Heart Healthy PGHW:[id number] mg BID   Status is: Inpatient  Remains inpatient appropriate because:Patient now awaiting CIR   Dispo: The patient is from: Home              Anticipated d/c is to: CIR              Patient currently is medically stable to d/c to CIR   Difficult to place patient No  Subjective:  No acute overnight events. Patient evaluated at bedside this morning.  He is in good spirits and states ready to be discharged to his rehab place.  Notes having bowel movement yesterday and added that he cleaned himself on his own.  Took shower on Saturday and planning to take 1 today.  No other complaints. Objective: Temp:  [98.1 F (36.7 C)-98.7 F (37.1 C)] 98.7 F (37.1 C) (05/16 0419) Pulse Rate:  [70-86] 72 (05/16 0419) Resp:  [17-20] 17 (  05/16 0419) BP: (98-111)/(58-70) 109/64 (05/16 0419) SpO2:  [89 %-97 %] 97 % (05/16 0419) Weight:  [181 lb 3.5 oz (82.2  kg)] 181 lb 3.5 oz (82.2 kg) (05/16 0500) Physical Exam: General: Patient sitting comfortably in chair, NAD Heart: Irregular rhythm, no murmur auscultated Lungs: Clear to auscultation bilaterally Abdomen: Soft, nondistended, nontender, bowel sounds present MSK: Mild edema around ankles R>L Skin: Warm and dry  Laboratory: Recent Labs  Lab 09/29/20 0030 09/30/20 0126 10/01/20 0044  WBC 9.4 11.5* 10.8*  HGB 8.9* 9.5* 9.0*  HCT 28.9* 30.2* 28.8*  PLT 423* 449* 419*   Recent Labs  Lab 09/29/20 0030 09/30/20 0126 10/01/20 0044  NA 133* 134* 132*  K 4.4 4.0 4.1  CL 95* 96* 93*  CO2 26 28 29   BUN 36* 31* 31*  CREATININE 1.00 0.94 1.11  CALCIUM 7.6* 7.9* 7.7*  GLUCOSE 182* 117* 155*    Imaging/Diagnostic Tests: No results found.   Honor Junes, MD 10/01/2020, 8:49 AM PGY-1, [place] Intern pager: [phone], text pages welcome

## 2020-10-02 ENCOUNTER — Inpatient Hospital Stay (HOSPITAL_COMMUNITY)
Admission: RE | Admit: 2020-10-02 | Discharge: 2020-10-10 | DRG: 945 | Disposition: A | Discharge status: Home / Self Care | Payer: Medicare Other | Source: Intra-hospital | Attending: Physical Medicine & Rehabilitation | Admitting: Physical Medicine & Rehabilitation

## 2020-10-02 ENCOUNTER — Other Ambulatory Visit: Payer: Self-pay

## 2020-10-02 ENCOUNTER — Encounter (HOSPITAL_COMMUNITY): Payer: Self-pay | Admitting: Family Medicine

## 2020-10-02 ENCOUNTER — Encounter (HOSPITAL_COMMUNITY): Payer: Self-pay | Admitting: Physical Medicine & Rehabilitation

## 2020-10-02 DIAGNOSIS — I480 Paroxysmal atrial fibrillation: Secondary | ICD-10-CM | POA: Diagnosis present

## 2020-10-02 DIAGNOSIS — I482 Chronic atrial fibrillation, unspecified: Secondary | ICD-10-CM | POA: Diagnosis present

## 2020-10-02 DIAGNOSIS — E785 Hyperlipidemia, unspecified: Secondary | ICD-10-CM | POA: Diagnosis present

## 2020-10-02 DIAGNOSIS — I4891 Unspecified atrial fibrillation: Secondary | ICD-10-CM | POA: Diagnosis present

## 2020-10-02 DIAGNOSIS — I2510 Atherosclerotic heart disease of native coronary artery without angina pectoris: Secondary | ICD-10-CM | POA: Diagnosis present

## 2020-10-02 DIAGNOSIS — R0902 Hypoxemia: Secondary | ICD-10-CM | POA: Diagnosis not present

## 2020-10-02 DIAGNOSIS — E669 Obesity, unspecified: Secondary | ICD-10-CM

## 2020-10-02 DIAGNOSIS — Z820 Family history of epilepsy and other diseases of the nervous system: Secondary | ICD-10-CM

## 2020-10-02 DIAGNOSIS — R918 Other nonspecific abnormal finding of lung field: Secondary | ICD-10-CM

## 2020-10-02 DIAGNOSIS — Z9981 Dependence on supplemental oxygen: Secondary | ICD-10-CM

## 2020-10-02 DIAGNOSIS — E1169 Type 2 diabetes mellitus with other specified complication: Secondary | ICD-10-CM | POA: Diagnosis present

## 2020-10-02 DIAGNOSIS — Z7982 Long term (current) use of aspirin: Secondary | ICD-10-CM | POA: Diagnosis not present

## 2020-10-02 DIAGNOSIS — Z7984 Long term (current) use of oral hypoglycemic drugs: Secondary | ICD-10-CM

## 2020-10-02 DIAGNOSIS — I509 Heart failure, unspecified: Secondary | ICD-10-CM

## 2020-10-02 DIAGNOSIS — I4819 Other persistent atrial fibrillation: Secondary | ICD-10-CM | POA: Diagnosis not present

## 2020-10-02 DIAGNOSIS — R5381 Other malaise: Principal | ICD-10-CM | POA: Diagnosis present

## 2020-10-02 DIAGNOSIS — E871 Hypo-osmolality and hyponatremia: Secondary | ICD-10-CM | POA: Diagnosis present

## 2020-10-02 DIAGNOSIS — K264 Chronic or unspecified duodenal ulcer with hemorrhage: Secondary | ICD-10-CM | POA: Diagnosis not present

## 2020-10-02 DIAGNOSIS — N179 Acute kidney failure, unspecified: Secondary | ICD-10-CM | POA: Diagnosis not present

## 2020-10-02 DIAGNOSIS — E46 Unspecified protein-calorie malnutrition: Secondary | ICD-10-CM

## 2020-10-02 DIAGNOSIS — Z87891 Personal history of nicotine dependence: Secondary | ICD-10-CM | POA: Diagnosis not present

## 2020-10-02 DIAGNOSIS — Z79899 Other long term (current) drug therapy: Secondary | ICD-10-CM | POA: Diagnosis not present

## 2020-10-02 DIAGNOSIS — D62 Acute posthemorrhagic anemia: Secondary | ICD-10-CM | POA: Diagnosis present

## 2020-10-02 DIAGNOSIS — Z823 Family history of stroke: Secondary | ICD-10-CM | POA: Diagnosis not present

## 2020-10-02 DIAGNOSIS — E8809 Other disorders of plasma-protein metabolism, not elsewhere classified: Secondary | ICD-10-CM | POA: Diagnosis present

## 2020-10-02 DIAGNOSIS — Z833 Family history of diabetes mellitus: Secondary | ICD-10-CM | POA: Diagnosis not present

## 2020-10-02 DIAGNOSIS — G4733 Obstructive sleep apnea (adult) (pediatric): Secondary | ICD-10-CM | POA: Diagnosis present

## 2020-10-02 DIAGNOSIS — J9611 Chronic respiratory failure with hypoxia: Secondary | ICD-10-CM | POA: Diagnosis present

## 2020-10-02 DIAGNOSIS — I10 Essential (primary) hypertension: Secondary | ICD-10-CM | POA: Diagnosis present

## 2020-10-02 DIAGNOSIS — I2609 Other pulmonary embolism with acute cor pulmonale: Secondary | ICD-10-CM

## 2020-10-02 DIAGNOSIS — J449 Chronic obstructive pulmonary disease, unspecified: Secondary | ICD-10-CM

## 2020-10-02 DIAGNOSIS — J9610 Chronic respiratory failure, unspecified whether with hypoxia or hypercapnia: Secondary | ICD-10-CM

## 2020-10-02 DIAGNOSIS — D72828 Other elevated white blood cell count: Secondary | ICD-10-CM | POA: Diagnosis not present

## 2020-10-02 DIAGNOSIS — D72829 Elevated white blood cell count, unspecified: Secondary | ICD-10-CM

## 2020-10-02 LAB — GLUCOSE, CAPILLARY
Glucose-Capillary: 141 mg/dL — ABNORMAL HIGH (ref 70–99)
Glucose-Capillary: 237 mg/dL — ABNORMAL HIGH (ref 70–99)
Glucose-Capillary: 129 mg/dL — ABNORMAL HIGH (ref 70–99)
Glucose-Capillary: 114 mg/dL — ABNORMAL HIGH (ref 70–99)

## 2020-10-02 LAB — BASIC METABOLIC PANEL
Anion gap: 12 (ref 5–15)
BUN: 28 mg/dL — ABNORMAL HIGH (ref 8–23)
CO2: 29 mmol/L (ref 22–32)
Calcium: 8.1 mg/dL — ABNORMAL LOW (ref 8.9–10.3)
Chloride: 92 mmol/L — ABNORMAL LOW (ref 98–111)
Creatinine, Ser: 1.09 mg/dL (ref 0.61–1.24)
GFR, Estimated: >60 mL/min (ref >60)
Glucose, Bld: 139 mg/dL — ABNORMAL HIGH (ref 70–99)
Potassium: 4.3 mmol/L (ref 3.5–5.1)
Sodium: 133 mmol/L — ABNORMAL LOW (ref 135–145)

## 2020-10-02 LAB — CBC
HCT: 31.1 % — ABNORMAL LOW (ref 39.0–52.0)
Hemoglobin: 9.9 g/dL — ABNORMAL LOW (ref 13.0–17.0)
MCH: 28.4 pg (ref 26.0–34.0)
MCHC: 31.8 g/dL (ref 30.0–36.0)
MCV: 89.1 fL (ref 80.0–100.0)
Platelets: 405 10*3/uL — ABNORMAL HIGH (ref 150–400)
RBC: 3.49 MIL/uL — ABNORMAL LOW (ref 4.22–5.81)
RDW: 15.9 % — ABNORMAL HIGH (ref 11.5–15.5)
WBC: 12.4 10*3/uL — ABNORMAL HIGH (ref 4.0–10.5)
nRBC: 0 % (ref 0.0–0.2)

## 2020-10-02 LAB — MAGNESIUM: Magnesium: 1.8 mg/dL (ref 1.7–2.4)

## 2020-10-02 MED ORDER — GUAIFENESIN-DM 100-10 MG/5ML PO SYRP
5.0000 mL | ORAL_SOLUTION | Freq: Four times a day (QID) | ORAL | Status: DC | PRN
Start: 2020-10-02 — End: 2020-10-10

## 2020-10-02 MED ORDER — ADULT MULTIVITAMIN W/MINERALS CH
1.0000 | ORAL_TABLET | Freq: Every day | ORAL | Status: DC
  Administered 2020-10-03 – 2020-10-10 (×8): 1 via ORAL
  Filled 2020-10-02 (×8): qty 1

## 2020-10-02 MED ORDER — PROCHLORPERAZINE EDISYLATE 10 MG/2ML IJ SOLN: 5.0000 mg | Freq: Four times a day (QID) | INTRAMUSCULAR | Status: DC | PRN

## 2020-10-02 MED ORDER — INSULIN GLARGINE 100 UNIT/ML ~~LOC~~ SOLN
4.0000 [IU] | Freq: Every day | SUBCUTANEOUS | Status: DC
  Administered 2020-10-02: 4 [IU] via SUBCUTANEOUS
  Filled 2020-10-02: qty 0.04

## 2020-10-02 MED ORDER — APIXABAN 5 MG PO TABS
5.0000 mg | ORAL_TABLET | Freq: Two times a day (BID) | ORAL | Status: DC
  Administered 2020-10-02 – 2020-10-10 (×16): 5 mg via ORAL
  Filled 2020-10-02 (×16): qty 1

## 2020-10-02 MED ORDER — ASPIRIN EC 81 MG PO TBEC
81.0000 mg | DELAYED_RELEASE_TABLET | Freq: Every day | ORAL | Status: DC
  Administered 2020-10-03 – 2020-10-10 (×8): 81 mg via ORAL
  Filled 2020-10-02 (×8): qty 1

## 2020-10-02 MED ORDER — UMECLIDINIUM BROMIDE 62.5 MCG/INH IN AEPB
1.0000 | INHALATION_SPRAY | Freq: Every day | RESPIRATORY_TRACT | Status: DC
  Filled 2020-10-02: qty 7

## 2020-10-02 MED ORDER — FLUTICASONE FUROATE-VILANTEROL 200-25 MCG/INH IN AEPB
1.0000 | INHALATION_SPRAY | Freq: Every day | RESPIRATORY_TRACT | Status: DC
  Filled 2020-10-02: qty 28

## 2020-10-02 MED ORDER — METFORMIN HCL 500 MG PO TABS
1000.0000 mg | ORAL_TABLET | Freq: Two times a day (BID) | ORAL | Status: DC
  Administered 2020-10-02 – 2020-10-10 (×16): 1000 mg via ORAL
  Filled 2020-10-02 (×16): qty 2

## 2020-10-02 MED ORDER — GLUCERNA SHAKE PO LIQD: 237.0000 mL | Freq: Three times a day (TID) | ORAL | 0 refills | Status: DC

## 2020-10-02 MED ORDER — SALINE SPRAY 0.65 % NA SOLN
1.0000 | NASAL | Status: DC | PRN
  Filled 2020-10-02: qty 44

## 2020-10-02 MED ORDER — TRAZODONE HCL 50 MG PO TABS: 25.0000 mg | ORAL_TABLET | Freq: Every evening | ORAL | Status: DC | PRN

## 2020-10-02 MED ORDER — INSULIN ASPART 100 UNIT/ML IJ SOLN
0.0000 [IU] | Freq: Every day | INTRAMUSCULAR | Status: DC
  Administered 2020-10-03: 2 [IU] via SUBCUTANEOUS

## 2020-10-02 MED ORDER — FERROUS SULFATE 325 (65 FE) MG PO TABS
325.0000 mg | ORAL_TABLET | Freq: Every day | ORAL | Status: DC
  Administered 2020-10-03 – 2020-10-10 (×8): 325 mg via ORAL
  Filled 2020-10-02 (×8): qty 1

## 2020-10-02 MED ORDER — FERROUS SULFATE 325 (65 FE) MG PO TABS
325.0000 mg | ORAL_TABLET | Freq: Every day | ORAL | 3 refills | Status: DC
Start: 2020-10-02 — End: 2020-10-09

## 2020-10-02 MED ORDER — TORSEMIDE 20 MG PO TABS
20.0000 mg | ORAL_TABLET | Freq: Every day | ORAL | Status: DC
  Administered 2020-10-03 – 2020-10-10 (×8): 20 mg via ORAL
  Filled 2020-10-02 (×8): qty 1

## 2020-10-02 MED ORDER — ENSURE MAX PROTEIN PO LIQD
11.0000 [oz_av] | Freq: Two times a day (BID) | ORAL | Status: DC
  Administered 2020-10-02 – 2020-10-10 (×12): 11 [oz_av] via ORAL

## 2020-10-02 MED ORDER — FLEET ENEMA 7-19 GM/118ML RE ENEM: 1.0000 | ENEMA | Freq: Once | RECTAL | Status: DC | PRN

## 2020-10-02 MED ORDER — INSULIN ASPART 100 UNIT/ML IJ SOLN
0.0000 [IU] | Freq: Three times a day (TID) | INTRAMUSCULAR | Status: DC
  Administered 2020-10-02: 1 [IU] via SUBCUTANEOUS
  Administered 2020-10-03 – 2020-10-05 (×6): 2 [IU] via SUBCUTANEOUS
  Administered 2020-10-06 (×2): 1 [IU] via SUBCUTANEOUS
  Administered 2020-10-06: 3 [IU] via SUBCUTANEOUS
  Administered 2020-10-07: 1 [IU] via SUBCUTANEOUS
  Administered 2020-10-07 (×2): 2 [IU] via SUBCUTANEOUS
  Administered 2020-10-08: 1 [IU] via SUBCUTANEOUS
  Administered 2020-10-08: 2 [IU] via SUBCUTANEOUS
  Administered 2020-10-08: 1 [IU] via SUBCUTANEOUS
  Administered 2020-10-09: 2 [IU] via SUBCUTANEOUS
  Administered 2020-10-09: 1 [IU] via SUBCUTANEOUS
  Administered 2020-10-09 – 2020-10-10 (×2): 2 [IU] via SUBCUTANEOUS

## 2020-10-02 MED ORDER — DILTIAZEM HCL ER COATED BEADS 180 MG PO CP24: 180.0000 mg | ORAL_CAPSULE | Freq: Every day | ORAL | 0 refills | Status: DC

## 2020-10-02 MED ORDER — BUDESONIDE 0.5 MG/2ML IN SUSP
0.2500 mg | Freq: Two times a day (BID) | RESPIRATORY_TRACT | Status: DC
  Administered 2020-10-03 – 2020-10-10 (×15): 0.25 mg via RESPIRATORY_TRACT
  Filled 2020-10-02 (×16): qty 2

## 2020-10-02 MED ORDER — AMIODARONE HCL 200 MG PO TABS: 200.0000 mg | ORAL_TABLET | Freq: Every day | ORAL | Status: DC

## 2020-10-02 MED ORDER — REVEFENACIN 175 MCG/3ML IN SOLN
175.0000 ug | Freq: Every day | RESPIRATORY_TRACT | Status: DC
  Administered 2020-10-03 – 2020-10-10 (×6): 175 ug via RESPIRATORY_TRACT
  Filled 2020-10-02 (×8): qty 3

## 2020-10-02 MED ORDER — ACETAMINOPHEN 325 MG PO TABS
325.0000 mg | ORAL_TABLET | ORAL | Status: DC | PRN
  Administered 2020-10-03 – 2020-10-08 (×6): 650 mg via ORAL
  Filled 2020-10-02 (×6): qty 2

## 2020-10-02 MED ORDER — AYR SALINE NASAL NA GEL: 1.0000 "application " | Freq: Two times a day (BID) | NASAL | Status: DC

## 2020-10-02 MED ORDER — PROSOURCE PLUS PO LIQD
30.0000 mL | Freq: Two times a day (BID) | ORAL | Status: DC
  Administered 2020-10-02 – 2020-10-10 (×15): 30 mL via ORAL
  Filled 2020-10-02 (×16): qty 30

## 2020-10-02 MED ORDER — ORAL CARE MOUTH RINSE
15.0000 mL | Freq: Two times a day (BID) | OROMUCOSAL | Status: DC
  Administered 2020-10-02 – 2020-10-10 (×13): 15 mL via OROMUCOSAL

## 2020-10-02 MED ORDER — ORAL CARE MOUTH RINSE: 15.0000 mL | Freq: Two times a day (BID) | OROMUCOSAL | 0 refills | Status: DC

## 2020-10-02 MED ORDER — SALINE SPRAY 0.65 % NA SOLN: 1.0000 | NASAL | 0 refills | Status: DC | PRN

## 2020-10-02 MED ORDER — MAGNESIUM SULFATE IN D5W 1-5 GM/100ML-% IV SOLN
1.0000 g | Freq: Once | INTRAVENOUS | Status: AC
  Administered 2020-10-02: 1 g via INTRAVENOUS
  Filled 2020-10-02: qty 100

## 2020-10-02 MED ORDER — MIDODRINE HCL 10 MG PO TABS: 10.0000 mg | ORAL_TABLET | Freq: Three times a day (TID) | ORAL | Status: DC

## 2020-10-02 MED ORDER — SALINE SPRAY 0.65 % NA SOLN
1.0000 | Freq: Two times a day (BID) | NASAL | Status: DC
  Administered 2020-10-02 – 2020-10-10 (×14): 1 via NASAL
  Filled 2020-10-02 (×2): qty 44

## 2020-10-02 MED ORDER — PANTOPRAZOLE SODIUM 40 MG PO TBEC
40.0000 mg | DELAYED_RELEASE_TABLET | Freq: Every day | ORAL | Status: DC
  Administered 2020-10-03 – 2020-10-10 (×8): 40 mg via ORAL
  Filled 2020-10-02 (×8): qty 1

## 2020-10-02 MED ORDER — AMIODARONE HCL 200 MG PO TABS
200.0000 mg | ORAL_TABLET | Freq: Every day | ORAL | Status: DC
  Administered 2020-10-03 – 2020-10-10 (×8): 200 mg via ORAL
  Filled 2020-10-02 (×8): qty 1

## 2020-10-02 MED ORDER — EMPAGLIFLOZIN 10 MG PO TABS: 10.0000 mg | ORAL_TABLET | Freq: Every day | ORAL | Status: DC

## 2020-10-02 MED ORDER — TORSEMIDE 20 MG PO TABS: 20.0000 mg | ORAL_TABLET | Freq: Every day | ORAL | Status: DC

## 2020-10-02 MED ORDER — PROCHLORPERAZINE 25 MG RE SUPP: 12.5000 mg | Freq: Four times a day (QID) | RECTAL | Status: DC | PRN

## 2020-10-02 MED ORDER — POLYETHYLENE GLYCOL 3350 17 G PO PACK
17.0000 g | PACK | Freq: Every day | ORAL | Status: DC
  Administered 2020-10-03 – 2020-10-10 (×5): 17 g via ORAL
  Filled 2020-10-02 (×9): qty 1

## 2020-10-02 MED ORDER — SILDENAFIL CITRATE 20 MG PO TABS
40.0000 mg | ORAL_TABLET | Freq: Three times a day (TID) | ORAL | Status: DC
  Administered 2020-10-02 – 2020-10-10 (×24): 40 mg via ORAL
  Filled 2020-10-02 (×27): qty 2

## 2020-10-02 MED ORDER — MIDODRINE HCL 5 MG PO TABS
10.0000 mg | ORAL_TABLET | Freq: Three times a day (TID) | ORAL | Status: DC
  Administered 2020-10-02 – 2020-10-10 (×24): 10 mg via ORAL
  Filled 2020-10-02 (×24): qty 2

## 2020-10-02 MED ORDER — METFORMIN HCL 500 MG PO TABS: 1000.0000 mg | ORAL_TABLET | Freq: Two times a day (BID) | ORAL | Status: DC

## 2020-10-02 MED ORDER — PROCHLORPERAZINE MALEATE 5 MG PO TABS: 5.0000 mg | ORAL_TABLET | Freq: Four times a day (QID) | ORAL | Status: DC | PRN

## 2020-10-02 MED ORDER — AYR SALINE NASAL NA GEL: 1.0000 "application " | Freq: Two times a day (BID) | NASAL | 0 refills | Status: DC

## 2020-10-02 MED ORDER — APIXABAN 5 MG PO TABS: 5.0000 mg | ORAL_TABLET | Freq: Two times a day (BID) | ORAL | Status: DC

## 2020-10-02 MED ORDER — PANTOPRAZOLE SODIUM 40 MG PO TBEC: 40.0000 mg | DELAYED_RELEASE_TABLET | Freq: Every day | ORAL | 0 refills | Status: DC

## 2020-10-02 MED ORDER — POLYETHYLENE GLYCOL 3350 17 G PO PACK: 17.0000 g | PACK | Freq: Every day | ORAL | Status: DC | PRN

## 2020-10-02 MED ORDER — INCRUSE ELLIPTA 62.5 MCG/INH IN AEPB: 1.0000 | INHALATION_SPRAY | Freq: Every day | RESPIRATORY_TRACT | Status: DC

## 2020-10-02 MED ORDER — MAGNESIUM SULFATE 50 % IJ SOLN: 1.0000 g | Freq: Once | INTRAMUSCULAR | Status: DC

## 2020-10-02 MED ORDER — DILTIAZEM HCL ER COATED BEADS 180 MG PO CP24
180.0000 mg | ORAL_CAPSULE | Freq: Every day | ORAL | Status: DC
  Administered 2020-10-03 – 2020-10-10 (×8): 180 mg via ORAL
  Filled 2020-10-02 (×8): qty 1

## 2020-10-02 MED ORDER — DIPHENHYDRAMINE HCL 12.5 MG/5ML PO ELIX: 12.5000 mg | ORAL_SOLUTION | Freq: Four times a day (QID) | ORAL | Status: DC | PRN

## 2020-10-02 MED ORDER — SILDENAFIL CITRATE 20 MG PO TABS: 40.0000 mg | ORAL_TABLET | Freq: Three times a day (TID) | ORAL | 0 refills | Status: DC

## 2020-10-02 MED ORDER — BISACODYL 10 MG RE SUPP: 10.0000 mg | Freq: Every day | RECTAL | Status: DC | PRN

## 2020-10-02 MED ORDER — ATORVASTATIN CALCIUM 40 MG PO TABS
40.0000 mg | ORAL_TABLET | Freq: Every evening | ORAL | Status: DC
  Administered 2020-10-02 – 2020-10-09 (×8): 40 mg via ORAL
  Filled 2020-10-02 (×8): qty 1

## 2020-10-02 MED ORDER — EMPAGLIFLOZIN 10 MG PO TABS
10.0000 mg | ORAL_TABLET | Freq: Every day | ORAL | Status: DC
  Administered 2020-10-03 – 2020-10-10 (×8): 10 mg via ORAL
  Filled 2020-10-02 (×8): qty 1

## 2020-10-02 MED ORDER — BREO ELLIPTA 200-25 MCG/INH IN AEPB: 1.0000 | INHALATION_SPRAY | Freq: Every day | RESPIRATORY_TRACT | Status: DC

## 2020-10-02 MED ORDER — ALUM & MAG HYDROXIDE-SIMETH 200-200-20 MG/5ML PO SUSP: 30.0000 mL | ORAL | Status: DC | PRN

## 2020-10-02 NOTE — Progress Notes (Signed)
Patient ID: Gary [name], male   DOB: 01/10/1951, 70 y.o.   MRN: [id number]     Advanced Heart Failure Rounding Note  PCP-Cardiologist: Elouise Munroe, MD   Subjective:   Events  4/22  EGD 4/22 showed 2 ulcers in duodenum that were not actively bleeding.  On PO Protonix, Hgb 10>9.3>8.6  4/23 Intubated. Dobutamine stopped due to marked tachycardia. Started antibiotics for pneumonia. Blood CX- NGTD. Sputum with strep.  4/24 Did not tolerate fast veletri wean.  4/25 Veletri stopped. Extubated. Given 1u PRBC  5/1 Amio gtt stopped>>PO 400 mg daily  5/3 Right thoracentesis with 1200cc out (mostly transudative) 5/5 In A fib . High Res CT- redemonstrated spiculated mass anterior RML with trace left pleural effusion.  5/5 Started on pulse-dose steroids 5/9 RHC mild pulmonary htn, normal left sided filling pressures, high cardiac output.  5/10 Bubble Study Negative   HRs improved on higher dose of diltiazem. resp status stable. hgb 9.9   For CIR today.   Objective:   Weight Range: 82.2 kg Body mass index is 28.38 kg/m.   Vital Signs:   Temp:  [98.1 F (36.7 C)-98.4 F (36.9 C)] 98.1 F (36.7 C) (05/17 0400) Pulse Rate:  [70-105] 70 (05/17 0400) Resp:  [15-22] 20 (05/17 0400) BP: (101-130)/(58-80) 124/80 (05/17 0400) SpO2:  [91 %-99 %] 95 % (05/17 0837) Last BM Date: 09/30/20  Weight change: Filed Weights   09/29/20 0500 09/30/20 0500 10/01/20 0500  Weight: 82.2 kg 82.5 kg 82.2 kg    Intake/Output:   Intake/Output Summary (Last 24 hours) at 10/02/2020 1020 Last data filed at 10/01/2020 2202 Gross per 24 hour  Intake 53 ml  Output 2200 ml  Net -2147 ml      Physical Exam   General:  Sitting in chair . No resp difficulty HEENT: normal Neck: supple. no JVD. Carotids 2+ bilat; no bruits. No lymphadenopathy or thryomegaly appreciated. Cor: PMI nondisplaced. Irregular rate & rhythm. No rubs, gallops or murmurs. Lungs: clear Abdomen: soft, nontender, nondistended. No  hepatosplenomegaly. No bruits or masses. Good bowel sounds. Extremities: no cyanosis, clubbing, rash, edema Neuro: alert & orientedx3, cranial nerves grossly intact. moves all 4 extremities w/o difficulty. Affect pleasant   Telemetry    A fib 70-80s Personally reviewed  Labs    CBC Recent Labs    10/01/20 0044 10/02/20 0206  WBC 10.8* 12.4*  HGB 9.0* 9.9*  HCT 28.8* 31.1*  MCV 90.3 89.1  PLT 419* 462*   Basic Metabolic Panel Recent Labs    10/01/20 0044 10/02/20 0206  NA 132* 133*  K 4.1 4.3  CL 93* 92*  CO2 29 29  GLUCOSE 155* 139*  BUN 31* 28*  CREATININE 1.11 1.09  CALCIUM 7.7* 8.1*  MG 1.5* 1.8   Liver Function Tests No results for input(s): AST, ALT, ALKPHOS, BILITOT, PROT, ALBUMIN in the last 72 hours. No results for input(s): LIPASE, AMYLASE in the last 72 hours. Cardiac Enzymes No results for input(s): CKTOTAL, CKMB, CKMBINDEX, TROPONINI in the last 72 hours.  BNP: BNP (last 3 results) Recent Labs    09/03/20 1433 09/20/20 0047  BNP 1,202.7* 360.6*    ProBNP (last 3 results) No results for input(s): PROBNP in the last 8760 hours.   D-Dimer No results for input(s): DDIMER in the last 72 hours. Hemoglobin A1C No results for input(s): HGBA1C in the last 72 hours. Fasting Lipid Panel No results for input(s): CHOL, HDL, LDLCALC, TRIG, CHOLHDL, LDLDIRECT in the last 72 hours. Thyroid  Function Tests No results for input(s): TSH, T4TOTAL, T3FREE, THYROIDAB in the last 72 hours.  Invalid input(s): FREET3  Other results:   Imaging    No results found.   Medications:     Scheduled Medications: . sodium chloride   Intravenous Once  . amiodarone  200 mg Oral Daily  . apixaban  5 mg Oral BID  . arformoterol  15 mcg Nebulization BID  . atorvastatin  40 mg Oral Daily  . budesonide  0.5 mg Nebulization BID  . chlorhexidine  15 mL Mouth Rinse BID  . diltiazem  180 mg Oral Daily  . empagliflozin  10 mg Oral Daily  . feeding supplement  (GLUCERNA SHAKE)  237 mL Oral TID BM  . ferrous sulfate  325 mg Oral Q breakfast  . insulin aspart  0-15 Units Subcutaneous TID WC  . insulin glargine  4 Units Subcutaneous Daily  . mouth rinse  15 mL Mouth Rinse q12n4p  . metFORMIN  1,000 mg Oral BID WC  . midodrine  10 mg Oral TID WC  . multivitamin with minerals  1 tablet Oral Daily  . pantoprazole  40 mg Oral Daily  . revefenacin  175 mcg Nebulization Daily  . saline  1 application Each Nare BID  . sildenafil  40 mg Oral TID  . sodium chloride flush  10-40 mL Intracatheter Q12H  . sodium chloride flush  3 mL Intravenous Q12H  . torsemide  20 mg Oral Daily    Infusions: . sodium chloride Stopped (09/10/20 1336)  . sodium chloride Stopped (09/24/20 0700)  . magnesium sulfate bolus IVPB 1 g (10/02/20 0928)    PRN Medications: sodium chloride, acetaminophen, guaiFENesin-dextromethorphan, ondansetron (ZOFRAN) IV, polyethylene glycol, sodium chloride    Assessment/Plan    1. Acute upper GI bleed with symptomatic anemia - hgb dropped to 6.7. Transfused.->  Eliquis held  - CT negative for RP bleed - EGD with 2 ulcers not acutely bleeding.  - On protonix PO 40 mg bid  - Off  AC for 2 weeks to allow GI healing - Biopsy back for H pylori. On treatment per GI - Eliquis restarted 5/12 -> Hgb stable at 9.9  2. PAH with cor pulmonale - CT chest 4/22: No PE or ILD - Echo LVEF 60% severe RV dilation and HK - Cath with moderate PAH. PA = 66/31 (46) PCW = 13 Fick = 4.1/2.1 PVR = 7.8 WU - Suspect this is WHO Group 3 PAH due to hypoxic lung disease/OHS/OSA - Will order PFTs (when better compensated) and will need outpatient sleep study - Serologies for completed. Note abnormal ANA, with borderline elevation in chromatin antibody, but all other serologies negative. ANCA pendingESR only mildly abnormal at 44 - repeat RHC 09/24/20 PA 47/23 (32) PCW 14 - Continue sildenafil 40 mg tid. On midodrine 10 mg three times a day for BP support  -  Volume status stable.  - Continue jardiance 10 mg daily and torsemide 20 daily  - stable   3. Paroxysmal AF - new onset. Unclear duration  - Eliquis on hold d/t GI bleed - Was in NSR 2 weeks ago --> back in AF - Off AC with recent large GI bleed - Continue SCDs.  - Continue PO amio 200 mg daily . - Eliquis restarted 5/12. Consider repeat DCCV after 3 weeks a/c -  May be candidate for Watchman LAA closure if we can get him stable on [facility] for 1-2 months. Would avoid long-term amio use given underlying  lung disease if at all possible.  - Rates improved on cardizem 180   4. CAD  -mostly non-obstructive - Continue atorvastatin 40 mg daily.  - No ASA with AC - No s/s ischemia   5. R lung mass - High Res CT- redemonstrated spiculated mass anterior RML with trace left pleural effusion.   6. Shock - Suspect mixed hemorrhagic/cardiogenic (RV failure).   - Resolved  7. Acute hypoxemic respiratory failure with bilateral pleural effusions - Intubated pre-EGD on 4/22.   - Extubated--> on HFNC-- very slow wean. On 30 L + 40%  - Respiratory status remains tenuous. As noted before, degree of hypoxemia far out of proportion to HF or PAH. Continue with aggressive pulmonary toilet and mobilization. - With response to O2 supplementation shunt physiology less likely.  -s/p R thora with 1200 cc out. (mostly transudative)  - High Res CT- redemonstrated spiculated mass anterior RML with trace left pleural effusion. - RHC 5/9 w/ mild pulmonary htn, normal left sided filling pressures, high cardiac output. - ? PVOD but would suspect pulmonary pressures to be higher - Echo w/o evidence of peripheral shunting  - Will need PFTs as an outpatient  - Has improved gradually.  - Continue torsemide 20 daily to keep dry  8. Deconditioning - Ready for CIR today. We will follow at a distance  Length of Stay: [place], MD  10/02/2020, 10:20 AM  Advanced Heart Failure Team Pager [phone] (M-F; [place])  Please contact [facility] for night-coverage after hours (5p -7a ) and weekends on [email or web address]

## 2020-10-02 NOTE — TOC Transition Note (Signed)
Transition of Care [facility]) - CM/SW Discharge Note   Patient Details  Name: Gary [name] MRN: [id number] Date of Birth: Oct 10, 1950  Transition of Care [facility]) CM/SW Contact:  Zenon Mayo, RN Phone Number: 10/02/2020, 1:15 PM   Clinical Narrative:    Patient is for dc to CIR today.   Final next level of care: IP Rehab Facility Barriers to Discharge: No Barriers Identified   Patient Goals and CMS Choice        Discharge Placement                       Discharge Plan and Services                                     Social Determinants of Health (SDOH) Interventions Food Insecurity Interventions: Intervention Not Indicated Financial Strain Interventions: Intervention Not Indicated Housing Interventions: Intervention Not Indicated Transportation Interventions: Intervention Not Indicated   Readmission Risk Interventions No flowsheet data found.

## 2020-10-02 NOTE — Progress Notes (Signed)
Inpatient Rehab Admissions Coordinator:   I have a bed available for this patient to admit to CIR today.  Dr. Wynelle Cleveland in agreement.  I will let pt/family and TOC team know.   Shann Medal, PT, DPT Admissions Coordinator [phone] 10/02/20  9:47 AM

## 2020-10-02 NOTE — Progress Notes (Signed)
Inpatient Rehabilitation  Patient information reviewed and entered into eRehab system by Dagmar Adcox M. Allizon Woznick, M.A., CCC/SLP, PPS Coordinator.  Information including medical coding, functional ability and quality indicators will be reviewed and updated through discharge.    

## 2020-10-02 NOTE — Progress Notes (Signed)
Follow up to place patient on CPAP. Patient resting comfortably on CPAP at this time.

## 2020-10-02 NOTE — Progress Notes (Signed)
Patient admitted to rehab on a bed, wife at bedside. PERRLA. A+ox4. Follows commands and responds appropriately. Lung sounds are diminished to upper lobes. Bowel sounds active to all 4 quadrants. Pulses are strong +2, irregular. Patient is on a heart healthy/carb mod diet; ACHS. Patient reports no pain at this time. Skin assessment performed with second nurse; scattered bruising noted to abdomen and bilateral arms and hernia to lower abdomen. No edema noted to lower extremities upon admission. Patient able to stand up with minimal assist x1. Call bell in reach. Will continue to monitor.

## 2020-10-02 NOTE — Progress Notes (Signed)
PMR Admission Coordinator Pre-Admission Assessment   Patient: Gary [name] is an 70 y.o., male MRN: [id number] DOB: 11-26-1950 Height: _0  (170.2 cm) Weight: 82.2 kg   Insurance Information HMO:     PPO:      PCP:      IPA:      80/20:      OTHER:  PRIMARY: Medicare A and B      Policy#: [id number]      Subscriber: patient CM Name:        Phone#:       Fax#:   Pre-Cert#:        Employer: Retired Benefits:  Phone #:       Name: Checked in [place]. Date: 10/17/13     Deduct: $1556      Out of Pocket Max: none      Life Max: N/A CIR: 100%      SNF: 100 days Outpatient: 80%     Co-Pay: 20% Home Health: 100%      Co-Pay: none DME: 80%     Co-Pay: 20% Providers: patient's choice  SECONDARY: Generic commercial Cign[employer] supplement      Policy#: [id number]     Phone#: [phone]   Financial Counselor:        Phone#:     The "Data Collection Information Summary" for patients in Inpatient Rehabilitation Facilities with attached "Privacy Act [place] Records" was provided and verbally reviewed with: Patient and Family   Emergency Contact Information         Contact Information     Name Relation Home Work Mobile    [place] Spouse [phone]         Current Medical History  Patient Admitting Diagnosis: Debility   History of Present Illness: A 70 y/o male who presents on 09/03/20 with progressive SOB. Found to have acute respiratory failure likely due to new onset of heart failure and A-fib with RVR. CXR- Rt effusion, prominent interstitial edema/disease. s/p cardiac cath 09/04/20. 4/21 hypotension, 4/22 drop in Hgb, GIB with intubation and urgent bedside EGD on 4/22 with PT sign off.  Pt was extubated on 4/26. PT reordered on 4/27.  PMhx: HTN and DM.   Patient's medical record from [facility] has been reviewed by the rehabilitation admission coordinator and physician.   Past Medical History      Past Medical  History:  Diagnosis Date  . Diabetes mellitus without complication ([place])    . Epistaxis 2014    Secondary to a nasal abnormality, treated and resolved  . Hyperlipidemia due to type 2 diabetes mellitus ([place])    . Hypertension        Family History   family history includes Alzheimer's disease in his mother; Diabetes in his brother and mother; Heart Problems in his father; Stroke in his father.   Prior Rehab/Hospitalizations Has the patient had prior rehab or hospitalizations prior to admission? No   Has the patient had major surgery during 100 days prior to admission? No               Current Medications   Current Facility-Administered Medications:  .  0.9 %  sodium chloride infusion (Manually program via Guardrails IV Fluids), , Intravenous, Once, Bensimhon, Daniel R, MD .  0.9 %  sodium chloride infusion, 250 mL, Intravenous, PRN, Bensimhon, Shaune Pascal, MD, Paused at 09/10/20 1336 .  0.9 %  sodium chloride infusion, 250 mL, Intravenous, Continuous, Bensimhon, Quillian Quince  R, MD, Stopped at 09/24/20 0700 .  acetaminophen (TYLENOL) tablet 650 mg, 650 mg, Oral, Q4H PRN, Bensimhon, Shaune Pascal, MD, 650 mg at 09/26/20 1142 .  amiodarone (PACERONE) tablet 200 mg, 200 mg, Oral, Daily, Bensimhon, Shaune Pascal, MD, 200 mg at 10/02/20 0921 .  apixaban (ELIQUIS) tablet 5 mg, 5 mg, Oral, BID, Espinoza, Alejandra, DO, 5 mg at 10/02/20 0921 .  arformoterol (BROVANA) nebulizer solution 15 mcg, 15 mcg, Nebulization, BID, Bensimhon, Shaune Pascal, MD, 15 mcg at 10/02/20 0834 .  atorvastatin (LIPITOR) tablet 40 mg, 40 mg, Oral, Daily, Bensimhon, Shaune Pascal, MD, 40 mg at 10/02/20 0921 .  budesonide (PULMICORT) nebulizer solution 0.5 mg, 0.5 mg, Nebulization, BID, Bensimhon, Shaune Pascal, MD, 0.5 mg at 10/02/20 0834 .  chlorhexidine (PERIDEX) 0.12 % solution 15 mL, 15 mL, Mouth Rinse, BID, Bensimhon, Shaune Pascal, MD, 15 mL at 10/02/20 0921 .  diltiazem (CARDIZEM CD) 24 hr capsule 180 mg, 180 mg, Oral, Daily, Bensimhon, Shaune Pascal, MD,  180 mg at 10/02/20 0921 .  empagliflozin (JARDIANCE) tablet 10 mg, 10 mg, Oral, Daily, Bensimhon, Shaune Pascal, MD, 10 mg at 10/02/20 0921 .  feeding supplement (GLUCERNA SHAKE) (GLUCERNA SHAKE) liquid 237 mL, 237 mL, Oral, TID BM, Bensimhon, Shaune Pascal, MD, 237 mL at 10/01/20 1419 .  ferrous sulfate tablet 325 mg, 325 mg, Oral, Q breakfast, Hensel, Jamal Collin, MD, 325 mg at 10/02/20 0920 .  guaiFENesin-dextromethorphan (ROBITUSSIN DM) 100-10 MG/5ML syrup 5 mL, 5 mL, Oral, Q4H PRN, Bensimhon, Shaune Pascal, MD, 5 mL at 09/17/20 0211 .  insulin aspart (novoLOG) injection 0-15 Units, 0-15 Units, Subcutaneous, TID WC, Bensimhon, Shaune Pascal, MD, 2 Units at 10/02/20 [phone] .  insulin glargine (LANTUS) injection 4 Units, 4 Units, Subcutaneous, Daily, Pray, Margaret E, MD .  magnesium sulfate IVPB 1 g 100 mL, 1 g, Intravenous, Once, Carollee Leitz, MD, Last Rate: 100 mL/hr at 10/02/20 0928, 1 g at 10/02/20 0928 .  MEDLINE mouth rinse, 15 mL, Mouth Rinse, q12n4p, Bensimhon, Shaune Pascal, MD, 15 mL at 10/01/20 1725 .  metFORMIN (GLUCOPHAGE) tablet 1,000 mg, 1,000 mg, Oral, BID WC, Hensel, Jamal Collin, MD, 1,000 mg at 10/02/20 0920 .  midodrine (PROAMATINE) tablet 10 mg, 10 mg, Oral, TID WC, Bensimhon, Shaune Pascal, MD, 10 mg at 10/02/20 0920 .  multivitamin with minerals tablet 1 tablet, 1 tablet, Oral, Daily, Bensimhon, Shaune Pascal, MD, 1 tablet at 10/02/20 [phone] .  ondansetron (ZOFRAN) injection 4 mg, 4 mg, Intravenous, Q6H PRN, Bensimhon, Shaune Pascal, MD .  pantoprazole (PROTONIX) EC tablet 40 mg, 40 mg, Oral, Daily, Dagar, Anjali, MD, 40 mg at 10/02/20 0927 .  polyethylene glycol (MIRALAX / GLYCOLAX) packet 17 g, 17 g, Oral, Daily PRN, Espinoza, Alejandra, DO, 17 g at 10/02/20 0653 .  revefenacin (YUPELRI) nebulizer solution 175 mcg, 175 mcg, Nebulization, Daily, Bensimhon, Shaune Pascal, MD, 175 mcg at 10/02/20 0834 .  saline (AYR) nasal gel with aloe 1 application, 1 application, Each Nare, BID, Bensimhon, Shaune Pascal, MD, 1 application at  35/46/56 0920 .  sildenafil (REVATIO) tablet 40 mg, 40 mg, Oral, TID, Bensimhon, Shaune Pascal, MD, 40 mg at 10/02/20 0920 .  sodium chloride (OCEAN) 0.65 % nasal spray 1 spray, 1 spray, Each Nare, PRN, Bensimhon, Shaune Pascal, MD, 1 spray at 10/02/20 0920 .  sodium chloride flush (NS) 0.9 % injection 10-40 mL, 10-40 mL, Intracatheter, Q12H, Bensimhon, Shaune Pascal, MD, 10 mL at 10/02/20 0924 .  sodium chloride flush (NS) 0.9 % injection 3 mL, 3 mL,  Intravenous, Q12H, Bensimhon, Shaune Pascal, MD, 3 mL at 10/02/20 0924 .  torsemide (DEMADEX) tablet 20 mg, 20 mg, Oral, Daily, Bensimhon, Shaune Pascal, MD, 20 mg at 10/02/20 2956   Patients Current Diet:     Diet Order                      Diet Heart Room service appropriate? Yes; Fluid consistency: Thin  Diet effective now                      Precautions / Restrictions Precautions Precautions: Fall,Other (comment) Precaution Comments: Watch SpO2 Restrictions Weight Bearing Restrictions: No    Has the patient had 2 or more falls or a fall with injury in the past year? No   Prior Activity Level Limited Community (1-2x/wk): Went out of house 3-4 days a week.   Prior Functional Level Self Care: Did the patient need help bathing, dressing, using the toilet or eating? Independent   Indoor Mobility: Did the patient need assistance with walking from room to room (with or without device)? Independent   Stairs: Did the patient need assistance with internal or external stairs (with or without device)? Independent   Functional Cognition: Did the patient need help planning regular tasks such as shopping or remembering to take medications? Independent   Home Assistive Devices / Equipment Home Assistive Devices/Equipment: None Home Equipment: Shower seat   Prior Device Use: Indicate devices/aids used by the patient prior to current illness, exacerbation or injury? None of the above   Current Functional Level Cognition   Overall Cognitive Status: Within  Functional Limits for tasks assessed Orientation Level: Oriented X4 General Comments: Some anxiety related to SOB with mobility; responds well to cues for pursed lip breathing (good recall from prior sessions) and encouragement    Extremity Assessment (includes Sensation/Coordination)   Upper Extremity Assessment: Overall WFL for tasks assessed  Lower Extremity Assessment: Defer to PT evaluation     ADLs   Overall ADL's : Needs assistance/impaired Eating/Feeding: Set up,Sitting Grooming: Wash/dry face,Wash/dry hands,Oral care,Set up,Sitting,Standing Grooming Details (indicate cue type and reason): Initially standing with seated rest breaks 4WRW Upper Body Bathing: Set up,Sitting Lower Body Bathing: Min guard,Sitting/lateral leans Lower Body Bathing Details (indicate cue type and reason): simulated via LB dressing from recliner Upper Body Dressing : Minimal assistance,Sitting Lower Body Dressing: Minimal assistance,Sit to/from stand,Cueing for compensatory techniques Lower Body Dressing Details (indicate cue type and reason): Pt requires assist to fully leg go of walker when pulling pants up.  Cues given to let go one hand at a time. Toilet Transfer: Animal nutritionist Details (indicate cue type and reason): simulated via functional mobility Toileting- Water quality scientist and Hygiene: Supervision/safety,Sit to/from Sales promotion account executive Details (indicate cue type and reason): completed urination task in standing with RW. good problem solving manuevering of RW in bathroom Functional mobility during ADLs: Supervision/safety,Rolling walker General ADL Comments: Pt with continued deficits in cardiopulmonary tolerance though motivated to return home. Improving balance with use of RW and monitoring of controlled breathing throughout activity. Provided energy conservation handout and reinforced strategies to use at home. Discussed  progression of IADL tasks once BADLs mastered     Mobility   Overal bed mobility: Modified Independent Bed Mobility: Supine to Sit Rolling: Min guard Sidelying to sit: Min assist Supine to sit: Min assist Sit to supine: Modified independent (Device/Increase time),HOB elevated General bed mobility comments: in chair on arrival and end of session  Transfers   Overall transfer level: Modified independent Equipment used: Rolling walker (2 wheeled) Transfers: Sit to/from Stand Sit to Stand: Modified independent (Device/Increase time) Stand pivot transfers: Min guard General transfer comment: sit<>stands from recliner supervision for safety. Pt walked an hour before with nursing     Ambulation / Gait / Stairs / Wheelchair Mobility   Ambulation/Gait Ambulation/Gait assistance: Counsellor (Feet): 50 Feet Assistive device: Rolling walker (2 wheeled) Gait Pattern/deviations: Step-through pattern,Decreased stride length General Gait Details: cues for safety, proximity to RW, not talking and walking and energy conservation. Pt able to walk 50' on 8L x 3 trials with 2-3 min seated rest between trial. 1st trial pt talking despite cues and sats 84-88% during gait, 2nd trial no talking with SpO2 90% during gait but drop to 85% at rest with 2 min recovery back to 90%. 3rd trial SpO2 88-92% during gait without talking with 2.5 min recovery once seated due to drop to 84% and recovery to 92% Gait velocity: Decreased Gait velocity interpretation: <1.8 ft/sec, indicate of risk for recurrent falls     Posture / Balance Dynamic Sitting Balance Sitting balance - Comments: sitting on edge of chair unsupported with no UE support and no LOB Balance Overall balance assessment: Needs assistance Sitting-balance support: No upper extremity supported,Feet supported Sitting balance-Leahy Scale: Good Sitting balance - Comments: sitting on edge of chair unsupported with no UE support and no  LOB Standing balance support: Bilateral upper extremity supported Standing balance-Leahy Scale: Fair Standing balance comment: fair static standing, use of B UE support for mobility     Special needs/care consideration Oxygen 5-6L at rest, 6-8L with activity and Diabetic management yes    Previous Home Environment (from acute therapy documentation) Living Arrangements: Spouse/significant other Available Help at Discharge: Family,Available PRN/intermittently Type of Home: House Home Layout: One level Home Access: Stairs to enter,Ramped entrance Entrance Stairs-Rails: None Entrance Stairs-Number of Steps: 2 Bathroom Shower/Tub: Multimedia programmer: Standard Bathroom Accessibility: Yes How Accessible: Accessible via walker [place]: No   Discharge Living Setting Plans for Discharge Living Setting: Patient's home,House,Lives with (comment) (Lives with wife) Type of Home at Discharge: House Discharge Home Layout: One level Discharge Home Access: Ramped entrance,Stairs to enter Entrance Stairs-Number of Steps: 2 steps but have ramp at the back of house Discharge Bathroom Shower/Tub: Walk-in shower,Curtain Discharge Bathroom Toilet: Standard Discharge Bathroom Accessibility: Yes How Accessible: Accessible via wheelchair,Accessible via walker Does the patient have any problems obtaining your medications?: No   Social/Family/Support Systems Patient Roles: Spouse (Has a wife.) Contact Information: [place] - spouse - [phone] Anticipated Caregiver: wife Ability/Limitations of Caregiver: Wife works but is currently on [employer] and is trying to extend [employer] Caregiver Availability: 24/7 (Will have a neighbor to help wife if needed.) Discharge Plan Discussed with Primary Caregiver: Yes Is Caregiver In Agreement with Plan?: Yes Does Caregiver/Family have Issues with Lodging/Transportation while Pt is in Rehab?: No   Goals Patient/Family Goal for Rehab: PT/OT mod I  and supervision goals Expected length of stay: 7-10 days Cultural Considerations: None Pt/Family Agrees to Admission and willing to participate: Yes Program Orientation Provided & Reviewed with Pt/Caregiver Including Roles  & Responsibilities: Yes   Decrease burden of Care through IP rehab admission: N/A   Possible need for SNF placement upon discharge: Not anticipated   Patient Condition: I have reviewed medical records from [facility] health, spoken with CM, and patient and spouse. I met with patient at the bedside for inpatient rehabilitation  assessment.  Patient will benefit from ongoing PT and OT, can actively participate in 3 hours of therapy a day 5 days of the week, and can make measurable gains during the admission.  Patient will also benefit from the coordinated team approach during an Inpatient Acute Rehabilitation admission.  The patient will receive intensive therapy as well as Rehabilitation physician, nursing, social worker, and care management interventions.  Due to bladder management, bowel management, safety, skin/wound care, disease management, medication administration, pain management and patient education the patient requires 24 hour a day rehabilitation nursing.  The patient is currently min assist to min guard with mobility and basic ADLs with desaturations to 84% on 8L, recovers to 90% within 1-2 minutes standing rest break.  Discharge setting and therapy post discharge at home with home health is anticipated.  Patient has agreed to participate in the Acute Inpatient Rehabilitation Program and will admit today.   Preadmission Screen Completed By: Jodell Cipro, with updates by Michel Santee, 10/02/2020 10:09 AM ______________________________________________________________________   Discussed status with Dr. Naaman Plummer on 10/02/20  at 10:09 AM  and received approval for admission today.   Admission Coordinator:  Retta Diones, RN, time 10/02/20 Sudie Grumbling 10:09 AM      Assessment/Plan: Diagnosis: debility after heart failure, respiratory failure 1. Does the need for close, 24 hr/day Medical supervision in concert with the patient's rehab needs make it unreasonable for this patient to be served in a less intensive setting? Yes 2. Co-Morbidities requiring supervision/potential complications: afib, PAH, GIB 3. Due to bladder management, bowel management, safety, skin/wound care, disease management, medication administration, pain management and patient education, does the patient require 24 hr/day rehab nursing? Yes 4. Does the patient require coordinated care of a physician, rehab nurse, PT, OT, and SLP to address physical and functional deficits in the context of the above medical diagnosis(es)? Yes Addressing deficits in the following areas: balance, endurance, locomotion, strength, transferring, bowel/bladder control, bathing, dressing, feeding, grooming, toileting and psychosocial support 5. Can the patient actively participate in an intensive therapy program of at least 3 hrs of therapy 5 days a week? Yes 6. The potential for patient to make measurable gains while on inpatient rehab is excellent 7. Anticipated functional outcomes upon discharge from inpatient rehab: modified independent and supervision PT, modified independent and supervision OT, n/a SLP 8. Estimated rehab length of stay to reach the above functional goals is: 7-10 days 9. Anticipated discharge destination: Home 10. Overall Rehab/Functional Prognosis: excellent     MD Signature: [name], MD, [facility] 10/02/2020

## 2020-10-02 NOTE — Plan of Care (Signed)

## 2020-10-02 NOTE — H&P (Signed)
Physical Medicine and Rehabilitation Admission H&P    Chief Complaint  Patient presents with  . Debility   : HPI:  Gary [name] is a 70 year old male with history of T2DM, HTN, epistaxis (requiring IR guided embolization), crush injury from tree branch (left clavicle Fx, lung contusion, skull Fx and pelvic Fx);  who was admitted on 09/03/20 with acute hypoxic respiratory failure requiring 15 L nonrebreather mask as well as IV diuresis for fluid overload.  He was noted to be in A. fib and had ST changes with BNP 1202.  2D echo showed EF of 55 to 60% with mild concentric left ventricular hypertrophy, mild MVR,  moderate TVR and moderately dilated right atrium.  CTA chest showed bilateral pleural effusion with patchy infiltrates and opacity in RML with little change from 06/27/20. he underwent cardiac cath revealing chronically occluded distal RCA, moderate PAH with moderately reduced cardiac output and nonobstructive CAD.     He was started on eliquis but developed acute blood loss anemia with drop in H/H from 13-->6.7 on 04/22 due to melena and required intubation due to tenuous respiratory status prior to EGD.  Eliquis placed on hold and EGD by Dr. Lyndel Safe revealed large non-bleeding duodenal ulcer, erosive gastritis, short segment Barrett's esophagus. He was treated with IV protonix and transitioned to po PPI bid and has been treated for H pylori. He tolerated extubation on 04/26. Hypotension treated with pressors and he was slowly weaned off vent. Dr. Haroldine Laws consulted to help with management of overload and PAH.  He was started on  milrionone added to help with diuresis and required IV amiodarone due to recurrent A fib. Sildenafil and Demadex added with midodrine for BP support.    He continued to require HFNC 40L/min and PCCM reconsulted for input and felt symptoms multifactorial due to cor pulmonale, WHO group 3 PAH, infiltrates and overload.   CTA chest repeated on 04/29 was negative for PE but  revealed progressive bilateral airspace disease greater in BUE felt to favor multifocal infection over edema and R>L pleural effusion.  He underwent thoracocentesis of 1200 cc serosanguinous exudative fluid from right pleural space on 05/03.  He continued to be hypoxic and high resolution CT chest 05/05 showed spiculated masslike opacity anterior RML with adjacent pleural retraction with right hilar and mediastinal lymphadenopathy concerning for malignancy with nodal metastatic disease and no evidence of fibrotic interstitial lung disease.  He was started on pulse dose steroids (completed 05/17) with plans for bronch/EBUS in the future. Repeat cardiac cath on 05/09 revaled mild pulmonary HTN with normal filling pressors and bubble study negative. Plans for PFTs and outpatient sleep study. CPAP ordered and patient showing compliance.  He has been weaned down to  HFNC 5L at rest and 8 L with minimal activity --->drop in oxygenation to low 80's. CIR recommended due to functional deficits.     Review of Systems  Constitutional: Negative for chills and fever.  HENT: Positive for hearing loss. Negative for tinnitus.   Eyes: Negative for blurred vision.  Respiratory: Positive for cough and shortness of breath.   Cardiovascular: Negative for chest pain and palpitations.  Gastrointestinal: Negative for constipation, heartburn and nausea.  Genitourinary: Negative for dysuria and urgency.  Musculoskeletal: Positive for joint pain (left knee OA) and myalgias.  Skin: Negative for itching and rash.  Neurological: Positive for weakness. Negative for headaches.  Psychiatric/Behavioral: Negative for memory loss. The patient is not nervous/anxious and does not have insomnia.  Past Medical History:  Diagnosis Date  . Diabetes mellitus without complication ([place])   . Epistaxis 2014   Secondary to a nasal abnormality, treated and resolved  . Hyperlipidemia due to type 2 diabetes mellitus ([place])   . Hypertension      Past Surgical History:  Procedure Laterality Date  . BIOPSY  09/07/2020   Procedure: BIOPSY;  Surgeon: Jackquline Denmark, MD;  Location: [facility] ENDOSCOPY;  Service: Endoscopy;;  . ESOPHAGOGASTRODUODENOSCOPY N/A 09/07/2020   Procedure: ESOPHAGOGASTRODUODENOSCOPY (EGD);  Surgeon: Jackquline Denmark, MD;  Location[facility] ENDOSCOPY;  Service: Endoscopy;  Laterality: N/A;  Bedside in ICU  . NOSE SURGERY  2014  . PELVIC FRACTURE SURGERY  2013  . RIGHT HEART CATH N/A 09/24/2020   Procedure: RIGHT HEART CATH;  Surgeon: Jolaine Artist, MD;  Location: [place] [facility] CV LAB;  Service: Cardiovascular;  Laterality: N/A;  . RIGHT/LEFT HEART CATH AND CORONARY ANGIOGRAPHY N/A 09/04/2020   Procedure: RIGHT/LEFT HEART CATH AND CORONARY ANGIOGRAPHY;  Surgeon: Jolaine Artist, MD;  Location: [place] CV LAB;  Service: Cardiovascular;  Laterality: N/A;    Family History  Problem Relation Age of Onset  . Diabetes Mother   . Alzheimer's disease Mother   . Stroke Father   . Heart Problems Father        had a pacemaker  . 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  He smoked 1 PPDX 10 years. He has never used smokeless tobacco. He reports that he drinks a beer occasionally. He dose not use drugs.   Allergies  Allergen Reactions  . Novocain [Procaine] Other (See Comments)    Unknown reaction as a young child    Medications Prior to Admission  Medication Sig Dispense Refill  . acetaminophen (TYLENOL) 325 MG tablet Take 650 mg by mouth every 6 (six) hours as needed for headache (pain).    Marland Kitchen amLODipine (NORVASC) 5 MG tablet Take 5 mg by mouth every morning.    Marland Kitchen aspirin EC 81 MG tablet Take 81 mg by mouth every morning. Swallow whole.    Marland Kitchen atorvastatin (LIPITOR) 40 MG tablet Take 40 mg by mouth every evening.    Marland Kitchen buPROPion (WELLBUTRIN SR) 150 MG 12 hr tablet Take 150 mg by mouth 2 (two) times daily.    . enalapril-hydrochlorothiazide  (VASERETIC) 10-25 MG per tablet Take 1 tablet by mouth at bedtime.    Marland Kitchen glipiZIDE (GLUCOTROL XL) 10 MG 24 hr tablet Take 10 mg by mouth every morning.    . metFORMIN (GLUCOPHAGE) 850 MG tablet Take 850-1,700 mg by mouth See admin instructions. Take 2 tablets (1700 mg) by mouth every morning and 1 tablet (850 mg) at night    . Multiple Vitamin (MULTIVITAMIN WITH MINERALS) TABS tablet Take 1 tablet by mouth daily.    . pioglitazone (ACTOS) 15 MG tablet Take 15 mg by mouth 2 (two) times daily.      Drug Regimen Review  Drug regimen was reviewed and remains appropriate with no significant issues identified  Home: Home Living Family/patient expects to be discharged to:: Private residence Living Arrangements: Spouse/significant other Available Help at Discharge: Family,Available PRN/intermittently Type of Home: House Home Access: Stairs to enter,Ramped entrance Entrance Stairs-Number of Steps: 2 Entrance Stairs-Rails: None Home Layout: One level Bathroom Shower/Tub: Multimedia programmer: Standard Bathroom Accessibility: Yes Home Equipment: Shower seat   Functional History: Prior Function Level of Independence: Independent Comments: Per Chart: Patient reports  independent with ADLs, IADLs, cooking/cleaning. Cares for 10 dogs at home. Retired Arts administrator.  Functional Status:  Mobility: Bed Mobility Overal bed mobility: Modified Independent Bed Mobility: Supine to Sit Rolling: Min guard Sidelying to sit: Min assist Supine to sit: Min assist Sit to supine: Modified independent (Device/Increase time),HOB elevated General bed mobility comments: in chair on arrival and end of session Transfers Overall transfer level: Modified independent Equipment used: Rolling walker (2 wheeled) Transfers: Sit to/from Stand Sit to Stand: Modified independent (Device/Increase time) Stand pivot transfers: Min guard General transfer comment: sit<>stands from recliner supervision for safety. Pt  walked an hour before with nursing Ambulation/Gait Ambulation/Gait assistance: Min guard Gait Distance (Feet): 50 Feet Assistive device: Rolling walker (2 wheeled) Gait Pattern/deviations: Step-through pattern,Decreased stride length General Gait Details: cues for safety, proximity to RW, not talking and walking and energy conservation. Pt able to walk 50' on 8L x 3 trials with 2-3 min seated rest between trial. 1st trial pt talking despite cues and sats 84-88% during gait, 2nd trial no talking with SpO2 90% during gait but drop to 85% at rest with 2 min recovery back to 90%. 3rd trial SpO2 88-92% during gait without talking with 2.5 min recovery once seated due to drop to 84% and recovery to 92% Gait velocity: Decreased Gait velocity interpretation: <1.8 ft/sec, indicate of risk for recurrent falls    ADL: ADL Overall ADL's : Needs assistance/impaired Eating/Feeding: Set up,Sitting Grooming: Wash/dry face,Wash/dry hands,Oral care,Set up,Sitting,Standing Grooming Details (indicate cue type and reason): Initially standing with seated rest breaks 4WRW Upper Body Bathing: Set up,Sitting Lower Body Bathing: Min guard,Sitting/lateral leans Lower Body Bathing Details (indicate cue type and reason): simulated via LB dressing from recliner Upper Body Dressing : Minimal assistance,Sitting Lower Body Dressing: Minimal assistance,Sit to/from stand,Cueing for compensatory techniques Lower Body Dressing Details (indicate cue type and reason): Pt requires assist to fully leg go of walker when pulling pants up.  Cues given to let go one hand at a time. Toilet Transfer: Animal nutritionist Details (indicate cue type and reason): simulated via functional mobility Toileting- Water quality scientist and Hygiene: Supervision/safety,Sit to/from Sales promotion account executive Details (indicate cue type and reason): completed urination task in standing with RW.  good problem solving manuevering of RW in bathroom Functional mobility during ADLs: Supervision/safety,Rolling walker General ADL Comments: Pt with continued deficits in cardiopulmonary tolerance though motivated to return home. Improving balance with use of RW and monitoring of controlled breathing throughout activity. Provided energy conservation handout and reinforced strategies to use at home. Discussed progression of IADL tasks once BADLs mastered  Cognition: Cognition Overall Cognitive Status: Within Functional Limits for tasks assessed Orientation Level: Oriented X4 Cognition Arousal/Alertness: Awake/alert Behavior During Therapy: WFL for tasks assessed/performed Overall Cognitive Status: Within Functional Limits for tasks assessed General Comments: Some anxiety related to SOB with mobility; responds well to cues for pursed lip breathing (good recall from prior sessions) and encouragement  Physical Exam: Blood pressure 124/80, pulse 70, temperature 98.1 F (36.7 C), temperature source Oral, resp. rate 20, height 5' 7"  (1.702 m), weight 82.2 kg, SpO2 95 %. Physical Exam Vitals reviewed.  Constitutional:      General: He is not in acute distress.    Appearance: He is well-developed.     Comments: Sitting in chair --NAD on HFNC 5 L  HENT:     Head: Normocephalic.     Comments: Nasal bridge with healing pressure related injury Cardiovascular:     Rate and Rhythm:  Normal rate and regular rhythm.     Pulses: Normal pulses.  Pulmonary:     Effort: Pulmonary effort is normal.     Breath sounds: Examination of the right-upper field reveals wheezing. Examination of the left-upper field reveals wheezing. Examination of the right-lower field reveals decreased breath sounds. Examination of the left-lower field reveals decreased breath sounds. Decreased breath sounds and wheezing present.  Abdominal:     General: Bowel sounds are normal. There is no distension.     Palpations: Abdomen is  soft.  Musculoskeletal:        General: No swelling or tenderness.     Cervical back: Normal range of motion.  Skin:    General: Skin is warm.  Neurological:     Mental Status: He is oriented to person, place, and time.     Comments: Decreased hearing but certainly functional. Alert and oriented x 3. Normal insight and awareness. Intact Memory. Normal language and speech. Cranial nerve exam unremarkable. Motor 4+/5 UE's, LE's 3/5 HF, 3+ KE and 4/5 ADF/PF. No sensory findings. Normal tone.   Psychiatric:        Mood and Affect: Mood normal.        Thought Content: Thought content normal.     Results for orders placed or performed during the hospital encounter of 09/03/20 (from the past 48 hour(s))  Glucose, capillary     Status: Abnormal   Collection Time: 09/30/20 11:32 AM  Result Value Ref Range   Glucose-Capillary 227 (H) 70 - 99 mg/dL    Comment: Glucose reference range applies only to samples taken after fasting for at least 8 hours.  Glucose, capillary     Status: Abnormal   Collection Time: 09/30/20  4:33 PM  Result Value Ref Range   Glucose-Capillary 176 (H) 70 - 99 mg/dL    Comment: Glucose reference range applies only to samples taken after fasting for at least 8 hours.  Glucose, capillary     Status: Abnormal   Collection Time: 09/30/20  9:20 PM  Result Value Ref Range   Glucose-Capillary 159 (H) 70 - 99 mg/dL    Comment: Glucose reference range applies only to samples taken after fasting for at least 8 hours.  Basic metabolic panel     Status: Abnormal   Collection Time: 10/01/20 12:44 AM  Result Value Ref Range   Sodium 132 (L) 135 - 145 mmol/L   Potassium 4.1 3.5 - 5.1 mmol/L   Chloride 93 (L) 98 - 111 mmol/L   CO2 29 22 - 32 mmol/L   Glucose, Bld 155 (H) 70 - 99 mg/dL    Comment: Glucose reference range applies only to samples taken after fasting for at least 8 hours.   BUN 31 (H) 8 - 23 mg/dL   Creatinine, Ser 1.11 0.61 - 1.24 mg/dL   Calcium 7.7 (L) 8.9 -  10.3 mg/dL   GFR, Estimated >60 >60 mL/min    Comment: (NOTE) Calculated using the CKD-EPI Creatinine Equation (2021)    Anion gap 10 5 - 15    Comment: Performed at [facility] [address] [place], [place] [address]  CBC     Status: Abnormal   Collection Time: 10/01/20 12:44 AM  Result Value Ref Range   WBC 10.8 (H) 4.0 - 10.5 K/uL   RBC 3.19 (L) 4.22 - 5.81 MIL/uL   Hemoglobin 9.0 (L) 13.0 - 17.0 g/dL   HCT 28.8 (L) 39.0 - 52.0 %   MCV 90.3  80.0 - 100.0 fL   MCH 28.2 26.0 - 34.0 pg   MCHC 31.3 30.0 - 36.0 g/dL   RDW 15.6 (H) 11.5 - 15.5 %   Platelets 419 (H) 150 - 400 K/uL   nRBC 0.0 0.0 - 0.2 %    Comment: Performed at [facility] [address] [place], [place] [address]  Magnesium     Status: Abnormal   Collection Time: 10/01/20 12:44 AM  Result Value Ref Range   Magnesium 1.5 (L) 1.7 - 2.4 mg/dL    Comment: Performed at [facility] [address], [place], [place] [address]  Glucose, capillary     Status: Abnormal   Collection Time: 10/01/20  6:37 AM  Result Value Ref Range   Glucose-Capillary 175 (H) 70 - 99 mg/dL    Comment: Glucose reference range applies only to samples taken after fasting for at least 8 hours.  Glucose, capillary     Status: Abnormal   Collection Time: 10/01/20 11:23 AM  Result Value Ref Range   Glucose-Capillary 215 (H) 70 - 99 mg/dL    Comment: Glucose reference range applies only to samples taken after fasting for at least 8 hours.   Comment 1 Notify RN    Comment 2 Document in Chart   Glucose, capillary     Status: Abnormal   Collection Time: 10/01/20  4:37 PM  Result Value Ref Range   Glucose-Capillary 184 (H) 70 - 99 mg/dL    Comment: Glucose reference range applies only to samples taken after fasting for at least 8 hours.   Comment 1 Notify RN    Comment 2 Document in Chart   Glucose, capillary     Status: Abnormal   Collection Time: 10/01/20  9:48 PM  Result Value Ref Range   Glucose-Capillary 152 (H) 70  - 99 mg/dL    Comment: Glucose reference range applies only to samples taken after fasting for at least 8 hours.  Basic metabolic panel     Status: Abnormal   Collection Time: 10/02/20  2:06 AM  Result Value Ref Range   Sodium 133 (L) 135 - 145 mmol/L   Potassium 4.3 3.5 - 5.1 mmol/L   Chloride 92 (L) 98 - 111 mmol/L   CO2 29 22 - 32 mmol/L   Glucose, Bld 139 (H) 70 - 99 mg/dL    Comment: Glucose reference range applies only to samples taken after fasting for at least 8 hours.   BUN 28 (H) 8 - 23 mg/dL   Creatinine, Ser 1.09 0.61 - 1.24 mg/dL   Calcium 8.1 (L) 8.9 - 10.3 mg/dL   GFR, Estimated >60 >60 mL/min    Comment: (NOTE) Calculated using the CKD-EPI Creatinine Equation (2021)    Anion gap 12 5 - 15    Comment: Performed at [facility] [address] [place], [place] [address]  CBC     Status: Abnormal   Collection Time: 10/02/20  2:06 AM  Result Value Ref Range   WBC 12.4 (H) 4.0 - 10.5 K/uL   RBC 3.49 (L) 4.22 - 5.81 MIL/uL   Hemoglobin 9.9 (L) 13.0 - 17.0 g/dL   HCT 31.1 (L) 39.0 - 52.0 %   MCV 89.1 80.0 - 100.0 fL   MCH 28.4 26.0 - 34.0 pg   MCHC 31.8 30.0 - 36.0 g/dL   RDW 15.9 (H) 11.5 - 15.5 %   Platelets 405 (H) 150 - 400 K/uL   nRBC 0.0  0.0 - 0.2 %    Comment: Performed at [facility], [place] [address], [place], [place] [address]  Magnesium     Status: None   Collection Time: 10/02/20  2:06 AM  Result Value Ref Range   Magnesium 1.8 1.7 - 2.4 mg/dL    Comment: Performed at [facility] [address], [place], [place] [address]  Glucose, capillary     Status: Abnormal   Collection Time: 10/02/20  6:43 AM  Result Value Ref Range   Glucose-Capillary 141 (H) 70 - 99 mg/dL    Comment: Glucose reference range applies only to samples taken after fasting for at least 8 hours.   No results found.     Medical Problem List and Plan: 1.  Functional and mobility deficits secondary to debility after respiratory failure and multiple associated  complications  -patient may shower  -ELOS/Goals: 7-10 days, mod I to supervision with PT and OT 2.  Antithrombotics: -DVT/anticoagulation:  Pharmaceutical: Other (comment)--Eliquis resumed 05/12 and question plans for [facility] closure.   -antiplatelet therapy: N/A 3. Pain Management: N/A 4. Mood: LCSW to follow for evaluation and support.   -antipsychotic agents: N/a 5. Neuropsych: This patient is capable of making decisions on his own behalf. 6. Skin/Wound Care: Routine pressure relief measures. 7. Fluids/Electrolytes/Nutrition:  Heart healthy diet. Recheck BMET in am.  8. OSA/Hypoxic respiratory failure: Completed pulse dose steroids on 05/17.   --pulmonary hygiene with flutter valve. CPAP. [place] nebs/day. Pulmicort nebs bid.   9. PAH with cor pulmonale:  Felt to be in setting of hypoxic lung disease/OHS/OSA  -On sildenafil, torsemide, Jardiance and midodrine for BP support.  10. GIB due to gastritis/duodenal ulcers/short segment Barrett's: Now on once daily Protonix.   --Back on DOAC. Monitor for any signs of recurrent bleeding.  11. A fib: Monitor HR tid--controlled on amiodarone and now back on Eliquis.   12. Hyponatremia: Has been stable stable around 132-135 range 13. AKI: Has resolved with normalization of SCr and will likely need to stay dry.  14. Hypomagnesemia: Improved with IV supplementation. Recheck in am. 15. Spiculated RML mass: Follow up with PCCM for further work up after d/c.  16. T2DM: Well controlled with Hgb A1c- 6.7. Continue metformin 1000 mg bid.   --Monitor BS ac/hs and use SSI for elevated BS.  17.  Leucocytosis: Likely reactive due to steroids  --Monitor for signs of infection/recovery.        Bary Leriche, PA-C 10/02/2020

## 2020-10-02 NOTE — Progress Notes (Signed)
Patient transferred to [phone]. Pt belongings sent with patient. Pt wife at beside during transfer.

## 2020-10-02 NOTE — H&P (Signed)
Physical Medicine and Rehabilitation Admission H&P        Chief Complaint  Patient presents with  . Debility   : HPI:  Gary [name] is a 70 year old male with history of T2DM, HTN, epistaxis (requiring IR guided embolization), crush injury from tree branch (left clavicle Fx, lung contusion, skull Fx and pelvic Fx);  who was admitted on 09/03/20 with acute hypoxic respiratory failure requiring 15 L nonrebreather mask as well as IV diuresis for fluid overload.  He was noted to be in A. fib and had ST changes with BNP 1202.  2D echo showed EF of 55 to 60% with mild concentric left ventricular hypertrophy, mild MVR,  moderate TVR and moderately dilated right atrium.  CTA chest showed bilateral pleural effusion with patchy infiltrates and opacity in RML with little change from 06/27/20. he underwent cardiac cath revealing chronically occluded distal RCA, moderate PAH with moderately reduced cardiac output and nonobstructive CAD.      He was started on eliquis but developed acute blood loss anemia with drop in H/H from 13-->6.7 on 04/22 due to melena and required intubation due to tenuous respiratory status prior to EGD.  Eliquis placed on hold and EGD by Dr. Lyndel Safe revealed large non-bleeding duodenal ulcer, erosive gastritis, short segment Barrett's esophagus. He was treated with IV protonix and transitioned to po PPI bid and has been treated for H pylori. He tolerated extubation on 04/26. Hypotension treated with pressors and he was slowly weaned off vent. Dr. Haroldine Laws consulted to help with management of overload and PAH.  He was started on  milrionone added to help with diuresis and required IV amiodarone due to recurrent A fib. Sildenafil and Demadex added with midodrine for BP support.     He continued to require HFNC 40L/min and PCCM reconsulted for input and felt symptoms multifactorial due to cor pulmonale, WHO group 3 PAH, infiltrates and overload.   CTA chest repeated on 04/29 was negative  for PE but revealed progressive bilateral airspace disease greater in BUE felt to favor multifocal infection over edema and R>L pleural effusion.  He underwent thoracocentesis of 1200 cc serosanguinous exudative fluid from right pleural space on 05/03.  He continued to be hypoxic and high resolution CT chest 05/05 showed spiculated masslike opacity anterior RML with adjacent pleural retraction with right hilar and mediastinal lymphadenopathy concerning for malignancy with nodal metastatic disease and no evidence of fibrotic interstitial lung disease.  He was started on pulse dose steroids (completed 05/17) with plans for bronch/EBUS in the future. Repeat cardiac cath on 05/09 revaled mild pulmonary HTN with normal filling pressors and bubble study negative. Plans for PFTs and outpatient sleep study. CPAP ordered and patient showing compliance.  He has been weaned down to  HFNC 5L at rest and 8 L with minimal activity --->drop in oxygenation to low 80's. CIR recommended due to functional deficits.       Review of Systems  Constitutional: Negative for chills and fever.  HENT: Positive for hearing loss. Negative for tinnitus.   Eyes: Negative for blurred vision.  Respiratory: Positive for cough and shortness of breath.   Cardiovascular: Negative for chest pain and palpitations.  Gastrointestinal: Negative for constipation, heartburn and nausea.  Genitourinary: Negative for dysuria and urgency.  Musculoskeletal: Positive for joint pain (left knee OA) and myalgias.  Skin: Negative for itching and rash.  Neurological: Positive for weakness. Negative for headaches.  Psychiatric/Behavioral: Negative for memory loss. The patient is  not nervous/anxious and does not have insomnia.       Past Medical History:  Diagnosis Date  . Diabetes mellitus without complication ([place])    . Epistaxis 2014    Secondary to a nasal abnormality, treated and resolved  . Hyperlipidemia due to type 2 diabetes mellitus ([place])     . Hypertension             Past Surgical History:  Procedure Laterality Date  . BIOPSY   09/07/2020    Procedure: BIOPSY;  Surgeon: Jackquline Denmark, MD;  Location: [facility] ENDOSCOPY;  Service: Endoscopy;;  . ESOPHAGOGASTRODUODENOSCOPY N/A 09/07/2020    Procedure: ESOPHAGOGASTRODUODENOSCOPY (EGD);  Surgeon: Jackquline Denmark, MD;  Location: [facility] ENDOSCOPY;  Service: Endoscopy;  Laterality: N/A;  Bedside in ICU  . NOSE SURGERY   2014  . PELVIC FRACTURE SURGERY   2013  . RIGHT HEART CATH N/A 09/24/2020    Procedure: RIGHT HEART CATH;  Surgeon: Jolaine Artist, MD;  Location: [place] CV LAB;  Service: Cardiovascular;  Laterality: N/A;  . RIGHT/LEFT HEART CATH AND CORONARY ANGIOGRAPHY N/A 09/04/2020    Procedure: RIGHT/LEFT HEART CATH AND CORONARY ANGIOGRAPHY;  Surgeon: Jolaine Artist, MD;  Location: [place] CV LAB;  Service: Cardiovascular;  Laterality: N/A;           Family History  Problem Relation Age of Onset  . Diabetes Mother    . Alzheimer's disease Mother    . Stroke Father    . Heart Problems Father          had a pacemaker  . 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  He smoked 1 PPDX 10 years. He has never used smokeless tobacco. He reports that he drinks a beer occasionally. He dose not use drugs.          Allergies  Allergen Reactions  . Novocain [Procaine] Other (See Comments)      Unknown reaction as a young child            Medications Prior to Admission  Medication Sig Dispense Refill  . acetaminophen (TYLENOL) 325 MG tablet Take 650 mg by mouth every 6 (six) hours as needed for headache (pain).      Marland Kitchen amLODipine (NORVASC) 5 MG tablet Take 5 mg by mouth every morning.      Marland Kitchen aspirin EC 81 MG tablet Take 81 mg by mouth every morning. Swallow whole.      Marland Kitchen atorvastatin (LIPITOR) 40 MG tablet Take 40 mg by mouth every evening.      Marland Kitchen buPROPion (WELLBUTRIN SR) 150 MG 12 hr tablet Take  150 mg by mouth 2 (two) times daily.      . enalapril-hydrochlorothiazide (VASERETIC) 10-25 MG per tablet Take 1 tablet by mouth at bedtime.      Marland Kitchen glipiZIDE (GLUCOTROL XL) 10 MG 24 hr tablet Take 10 mg by mouth every morning.      . metFORMIN (GLUCOPHAGE) 850 MG tablet Take 850-1,700 mg by mouth See admin instructions. Take 2 tablets (1700 mg) by mouth every morning and 1 tablet (850 mg) at night      . Multiple Vitamin (MULTIVITAMIN WITH MINERALS) TABS tablet Take 1 tablet by mouth daily.      . pioglitazone (ACTOS) 15 MG tablet Take 15 mg by mouth 2 (two) times daily.          Drug Regimen Review  Drug regimen was reviewed and remains appropriate with no significant issues identified   Home: Home Living Family/patient expects to be discharged to:: Private residence Living Arrangements: Spouse/significant other Available Help at Discharge: Family,Available PRN/intermittently Type of Home: House Home Access: Stairs to enter,Ramped entrance Entrance Stairs-Number of Steps: 2 Entrance Stairs-Rails: None Home Layout: One level Bathroom Shower/Tub: Multimedia programmer: Standard Bathroom Accessibility: Yes Home Equipment: Industrial/product designer History: Prior Function Level of Independence: Independent Comments: Per Chart: Patient reports independent with ADLs, IADLs, cooking/cleaning. Cares for 10 dogs at home. Retired Arts administrator.   Functional Status:  Mobility: Bed Mobility Overal bed mobility: Modified Independent Bed Mobility: Supine to Sit Rolling: Min guard Sidelying to sit: Min assist Supine to sit: Min assist Sit to supine: Modified independent (Device/Increase time),HOB elevated General bed mobility comments: in chair on arrival and end of session Transfers Overall transfer level: Modified independent Equipment used: Rolling walker (2 wheeled) Transfers: Sit to/from Stand Sit to Stand: Modified independent (Device/Increase time) Stand pivot transfers:  Min guard General transfer comment: sit<>stands from recliner supervision for safety. Pt walked an hour before with nursing Ambulation/Gait Ambulation/Gait assistance: Min guard Gait Distance (Feet): 50 Feet Assistive device: Rolling walker (2 wheeled) Gait Pattern/deviations: Step-through pattern,Decreased stride length General Gait Details: cues for safety, proximity to RW, not talking and walking and energy conservation. Pt able to walk 50' on 8L x 3 trials with 2-3 min seated rest between trial. 1st trial pt talking despite cues and sats 84-88% during gait, 2nd trial no talking with SpO2 90% during gait but drop to 85% at rest with 2 min recovery back to 90%. 3rd trial SpO2 88-92% during gait without talking with 2.5 min recovery once seated due to drop to 84% and recovery to 92% Gait velocity: Decreased Gait velocity interpretation: <1.8 ft/sec, indicate of risk for recurrent falls   ADL: ADL Overall ADL's : Needs assistance/impaired Eating/Feeding: Set up,Sitting Grooming: Wash/dry face,Wash/dry hands,Oral care,Set up,Sitting,Standing Grooming Details (indicate cue type and reason): Initially standing with seated rest breaks 4WRW Upper Body Bathing: Set up,Sitting Lower Body Bathing: Min guard,Sitting/lateral leans Lower Body Bathing Details (indicate cue type and reason): simulated via LB dressing from recliner Upper Body Dressing : Minimal assistance,Sitting Lower Body Dressing: Minimal assistance,Sit to/from stand,Cueing for compensatory techniques Lower Body Dressing Details (indicate cue type and reason): Pt requires assist to fully leg go of walker when pulling pants up.  Cues given to let go one hand at a time. Toilet Transfer: Animal nutritionist Details (indicate cue type and reason): simulated via functional mobility Toileting- Water quality scientist and Hygiene: Supervision/safety,Sit to/from Sales promotion account executive  Details (indicate cue type and reason): completed urination task in standing with RW. good problem solving manuevering of RW in bathroom Functional mobility during ADLs: Supervision/safety,Rolling walker General ADL Comments: Pt with continued deficits in cardiopulmonary tolerance though motivated to return home. Improving balance with use of RW and monitoring of controlled breathing throughout activity. Provided energy conservation handout and reinforced strategies to use at home. Discussed progression of IADL tasks once BADLs mastered   Cognition: Cognition Overall Cognitive Status: Within Functional Limits for tasks assessed Orientation Level: Oriented X4 Cognition Arousal/Alertness: Awake/alert Behavior During Therapy: WFL for tasks assessed/performed Overall Cognitive Status: Within Functional Limits for tasks assessed General Comments: Some anxiety related to SOB with mobility; responds well to cues for pursed lip breathing (good recall from prior sessions) and encouragement   Physical Exam: Blood pressure 124/80, pulse 70,  temperature 98.1 F (36.7 C), temperature source Oral, resp. rate 20, height 5' 7"  (1.702 m), weight 82.2 kg, SpO2 95 %. Physical Exam Vitals reviewed.  Constitutional:      General: He is not in acute distress.    Appearance: He is well-developed.     Comments: Sitting in chair --NAD on HFNC 5 L  HENT:     Head: Normocephalic.     Comments: Nasal bridge with healing pressure related injury Cardiovascular:     Rate and Rhythm: Normal rate and regular rhythm.     Pulses: Normal pulses.  Pulmonary:     Effort: Pulmonary effort is normal.     Breath sounds: Examination of the right-upper field reveals wheezing. Examination of the left-upper field reveals wheezing. Examination of the right-lower field reveals decreased breath sounds. Examination of the left-lower field reveals decreased breath sounds. Decreased breath sounds and wheezing present.  Abdominal:      General: Bowel sounds are normal. There is no distension.     Palpations: Abdomen is soft.  Musculoskeletal:        General: No swelling or tenderness.     Cervical back: Normal range of motion.  Skin:    General: Skin is warm.  Neurological:     Mental Status: He is oriented to person, place, and time.     Comments: Decreased hearing but certainly functional. Alert and oriented x 3. Normal insight and awareness. Intact Memory. Normal language and speech. Cranial nerve exam unremarkable. Motor 4+/5 UE's, LE's 3/5 HF, 3+ KE and 4/5 ADF/PF. No sensory findings. Normal tone.   Psychiatric:        Mood and Affect: Mood normal.        Thought Content: Thought content normal.        Lab Results Last 48 Hours        Results for orders placed or performed during the hospital encounter of 09/03/20 (from the past 48 hour(s))  Glucose, capillary     Status: Abnormal    Collection Time: 09/30/20 11:32 AM  Result Value Ref Range    Glucose-Capillary 227 (H) 70 - 99 mg/dL      Comment: Glucose reference range applies only to samples taken after fasting for at least 8 hours.  Glucose, capillary     Status: Abnormal    Collection Time: 09/30/20  4:33 PM  Result Value Ref Range    Glucose-Capillary 176 (H) 70 - 99 mg/dL      Comment: Glucose reference range applies only to samples taken after fasting for at least 8 hours.  Glucose, capillary     Status: Abnormal    Collection Time: 09/30/20  9:20 PM  Result Value Ref Range    Glucose-Capillary 159 (H) 70 - 99 mg/dL      Comment: Glucose reference range applies only to samples taken after fasting for at least 8 hours.  Basic metabolic panel     Status: Abnormal    Collection Time: 10/01/20 12:44 AM  Result Value Ref Range    Sodium 132 (L) 135 - 145 mmol/L    Potassium 4.1 3.5 - 5.1 mmol/L    Chloride 93 (L) 98 - 111 mmol/L    CO2 29 22 - 32 mmol/L    Glucose, Bld 155 (H) 70 - 99 mg/dL      Comment: Glucose reference range applies only to  samples taken after fasting for at least 8 hours.    BUN 31 (H) 8 - 23  mg/dL    Creatinine, Ser 1.11 0.61 - 1.24 mg/dL    Calcium 7.7 (L) 8.9 - 10.3 mg/dL    GFR, Estimated >60 >60 mL/min      Comment: (NOTE) Calculated using the CKD-EPI Creatinine Equation (2021)      Anion gap 10 5 - 15      Comment: Performed at [facility] [address], [place], [place] [address]  CBC     Status: Abnormal    Collection Time: 10/01/20 12:44 AM  Result Value Ref Range    WBC 10.8 (H) 4.0 - 10.5 K/uL    RBC 3.19 (L) 4.22 - 5.81 MIL/uL    Hemoglobin 9.0 (L) 13.0 - 17.0 g/dL    HCT 28.8 (L) 39.0 - 52.0 %    MCV 90.3 80.0 - 100.0 fL    MCH 28.2 26.0 - 34.0 pg    MCHC 31.3 30.0 - 36.0 g/dL    RDW 15.6 (H) 11.5 - 15.5 %    Platelets 419 (H) 150 - 400 K/uL    nRBC 0.0 0.0 - 0.2 %      Comment: Performed at [facility] [address] [place], [place] [address]  Magnesium     Status: Abnormal    Collection Time: 10/01/20 12:44 AM  Result Value Ref Range    Magnesium 1.5 (L) 1.7 - 2.4 mg/dL      Comment: Performed at [facility] [address], [place], [place] [address]  Glucose, capillary     Status: Abnormal    Collection Time: 10/01/20  6:37 AM  Result Value Ref Range    Glucose-Capillary 175 (H) 70 - 99 mg/dL      Comment: Glucose reference range applies only to samples taken after fasting for at least 8 hours.  Glucose, capillary     Status: Abnormal    Collection Time: 10/01/20 11:23 AM  Result Value Ref Range    Glucose-Capillary 215 (H) 70 - 99 mg/dL      Comment: Glucose reference range applies only to samples taken after fasting for at least 8 hours.    Comment 1 Notify RN      Comment 2 Document in Chart    Glucose, capillary     Status: Abnormal    Collection Time: 10/01/20  4:37 PM  Result Value Ref Range    Glucose-Capillary 184 (H) 70 - 99 mg/dL      Comment: Glucose reference range applies only to samples taken after fasting for at least 8 hours.     Comment 1 Notify RN      Comment 2 Document in Chart    Glucose, capillary     Status: Abnormal    Collection Time: 10/01/20  9:48 PM  Result Value Ref Range    Glucose-Capillary 152 (H) 70 - 99 mg/dL      Comment: Glucose reference range applies only to samples taken after fasting for at least 8 hours.  Basic metabolic panel     Status: Abnormal    Collection Time: 10/02/20  2:06 AM  Result Value Ref Range    Sodium 133 (L) 135 - 145 mmol/L    Potassium 4.3 3.5 - 5.1 mmol/L    Chloride 92 (L) 98 - 111 mmol/L    CO2 29 22 - 32 mmol/L    Glucose, Bld 139 (H) 70 - 99 mg/dL      Comment: Glucose reference range applies only to samples taken  after fasting for at least 8 hours.    BUN 28 (H) 8 - 23 mg/dL    Creatinine, Ser 1.09 0.61 - 1.24 mg/dL    Calcium 8.1 (L) 8.9 - 10.3 mg/dL    GFR, Estimated >60 >60 mL/min      Comment: (NOTE) Calculated using the CKD-EPI Creatinine Equation (2021)      Anion gap 12 5 - 15      Comment: Performed at [facility] [address] [place], [place] [address]  CBC     Status: Abnormal    Collection Time: 10/02/20  2:06 AM  Result Value Ref Range    WBC 12.4 (H) 4.0 - 10.5 K/uL    RBC 3.49 (L) 4.22 - 5.81 MIL/uL    Hemoglobin 9.9 (L) 13.0 - 17.0 g/dL    HCT 31.1 (L) 39.0 - 52.0 %    MCV 89.1 80.0 - 100.0 fL    MCH 28.4 26.0 - 34.0 pg    MCHC 31.8 30.0 - 36.0 g/dL    RDW 15.9 (H) 11.5 - 15.5 %    Platelets 405 (H) 150 - 400 K/uL    nRBC 0.0 0.0 - 0.2 %      Comment: Performed at [facility], [place] [address], [place], [place] [address]  Magnesium     Status: None    Collection Time: 10/02/20  2:06 AM  Result Value Ref Range    Magnesium 1.8 1.7 - 2.4 mg/dL      Comment: Performed at [facility] [address] [place], [place] [address]  Glucose, capillary     Status: Abnormal    Collection Time: 10/02/20  6:43 AM  Result Value Ref Range    Glucose-Capillary 141 (H) 70 - 99 mg/dL      Comment: Glucose reference range  applies only to samples taken after fasting for at least 8 hours.      Imaging Results (Last 48 hours)  No results found.           Medical Problem List and Plan: 1.  Functional and mobility deficits secondary to debility after respiratory failure and multiple associated complications             -patient may shower             -ELOS/Goals: 7-10 days, mod I to supervision with PT and OT 2.  Antithrombotics: -DVT/anticoagulation:  Pharmaceutical: Other (comment)--Eliquis resumed 05/12 and question plans for [facility] closure.              -antiplatelet therapy: N/A 3. Pain Management: N/A 4. Mood: LCSW to follow for evaluation and support.              -antipsychotic agents: N/a 5. Neuropsych: This patient is capable of making decisions on his own behalf. 6. Skin/Wound Care: Routine pressure relief measures. 7. Fluids/Electrolytes/Nutrition:  Heart healthy diet. Recheck BMET in am.  8. OSA/Hypoxic respiratory failure: Completed pulse dose steroids on 05/17.              --pulmonary hygiene with flutter valve. CPAP. Yupleri nebs/day. Pulmicort nebs bid.   9. PAH with cor pulmonale:  Felt to be in setting of hypoxic lung disease/OHS/OSA             -On sildenafil, torsemide, Jardiance and midodrine for BP support.  10. GIB due to gastritis/duodenal ulcers/short segment Barrett's: Now on once daily Protonix.              --  Back on DOAC. Monitor for any signs of recurrent bleeding.  11. A fib: Monitor HR tid--controlled on amiodarone and now back on Eliquis.   12. Hyponatremia: Has been stable stable around 132-135 range 13. AKI: Has resolved with normalization of SCr and will likely need to stay dry.  14. Hypomagnesemia: Improved with IV supplementation. Recheck in am. 15. Spiculated RML mass: Follow up with PCCM for further work up after d/c.  16. T2DM: Well controlled with Hgb A1c- 6.7. Continue metformin 1000 mg bid.              --Monitor BS ac/hs and use SSI for elevated BS.   17.  Leucocytosis: Likely reactive due to steroids             --Monitor for signs of infection/recovery.            Bary Leriche, PA-C 10/02/2020  I have personally performed a face to face diagnostic evaluation of this patient and formulated the key components of the plan.  Additionally, I have personally reviewed laboratory data, imaging studies, as well as relevant notes and concur with the physician assistant's documentation above.  The patient's status has not changed from the original H&P.  Any changes in documentation from the acute care chart have been noted above.  [name], MD, Mellody Drown

## 2020-10-02 NOTE — Progress Notes (Signed)
Patient ID: Gary [name], male   DOB: [birth year], 70 y.o.   MRN: [id number] Met with the patient and wife to review role of the nurse CM and initiate education. Reviewed CMM/HH diet, HF-daily wts, low salt, high protein, increased magnesium diet. Reviewed rehab schedule and plan of care, therapy team conference schedule and ELOS. Continue to follow along to discharge to address educational needs and collaborate with the SW to facilitate preparation for discharge. Margarito Liner

## 2020-10-03 DIAGNOSIS — E871 Hypo-osmolality and hyponatremia: Secondary | ICD-10-CM

## 2020-10-03 LAB — CBC WITH DIFFERENTIAL/PLATELET
Abs Immature Granulocytes: 0.1 10*3/uL — ABNORMAL HIGH (ref 0.00–0.07)
Basophils Absolute: 0 10*3/uL (ref 0.0–0.1)
Basophils Relative: 0 %
Eosinophils Absolute: 0.1 10*3/uL (ref 0.0–0.5)
Eosinophils Relative: 1 %
HCT: 31.1 % — ABNORMAL LOW (ref 39.0–52.0)
Hemoglobin: 9.6 g/dL — ABNORMAL LOW (ref 13.0–17.0)
Immature Granulocytes: 1 %
Lymphocytes Relative: 12 %
Lymphs Abs: 1.3 10*3/uL (ref 0.7–4.0)
MCH: 27.7 pg (ref 26.0–34.0)
MCHC: 30.9 g/dL (ref 30.0–36.0)
MCV: 89.6 fL (ref 80.0–100.0)
Monocytes Absolute: 0.6 10*3/uL (ref 0.1–1.0)
Monocytes Relative: 6 %
Neutro Abs: 8.9 10*3/uL — ABNORMAL HIGH (ref 1.7–7.7)
Neutrophils Relative %: 80 %
Platelets: 333 10*3/uL (ref 150–400)
RBC: 3.47 MIL/uL — ABNORMAL LOW (ref 4.22–5.81)
RDW: 15.9 % — ABNORMAL HIGH (ref 11.5–15.5)
WBC: 11 10*3/uL — ABNORMAL HIGH (ref 4.0–10.5)
nRBC: 0 % (ref 0.0–0.2)

## 2020-10-03 LAB — COMPREHENSIVE METABOLIC PANEL
ALT: 33 U/L (ref 0–44)
AST: 21 U/L (ref 15–41)
Albumin: 2.1 g/dL — ABNORMAL LOW (ref 3.5–5.0)
Alkaline Phosphatase: 54 U/L (ref 38–126)
Anion gap: 6 (ref 5–15)
BUN: 24 mg/dL — ABNORMAL HIGH (ref 8–23)
CO2: 33 mmol/L — ABNORMAL HIGH (ref 22–32)
Calcium: 7.8 mg/dL — ABNORMAL LOW (ref 8.9–10.3)
Chloride: 94 mmol/L — ABNORMAL LOW (ref 98–111)
Creatinine, Ser: 0.9 mg/dL (ref 0.61–1.24)
GFR, Estimated: >60 mL/min (ref >60)
Glucose, Bld: 85 mg/dL (ref 70–99)
Potassium: 4 mmol/L (ref 3.5–5.1)
Sodium: 133 mmol/L — ABNORMAL LOW (ref 135–145)
Total Bilirubin: 0.8 mg/dL (ref 0.3–1.2)
Total Protein: 4.7 g/dL — ABNORMAL LOW (ref 6.5–8.1)

## 2020-10-03 LAB — GLUCOSE, CAPILLARY
Glucose-Capillary: 79 mg/dL (ref 70–99)
Glucose-Capillary: 169 mg/dL — ABNORMAL HIGH (ref 70–99)
Glucose-Capillary: 174 mg/dL — ABNORMAL HIGH (ref 70–99)
Glucose-Capillary: 206 mg/dL — ABNORMAL HIGH (ref 70–99)

## 2020-10-03 LAB — MAGNESIUM: Magnesium: 1.9 mg/dL (ref 1.7–2.4)

## 2020-10-03 MED ORDER — ARFORMOTEROL TARTRATE 15 MCG/2ML IN NEBU: 15.0000 ug | INHALATION_SOLUTION | Freq: Two times a day (BID) | RESPIRATORY_TRACT | Status: DC

## 2020-10-03 MED ORDER — ARFORMOTEROL TARTRATE 15 MCG/2ML IN NEBU
15.0000 ug | INHALATION_SOLUTION | Freq: Two times a day (BID) | RESPIRATORY_TRACT | Status: DC
  Administered 2020-10-04 – 2020-10-10 (×11): 15 ug via RESPIRATORY_TRACT
  Filled 2020-10-03 (×9): qty 2

## 2020-10-03 NOTE — Evaluation (Addendum)
Physical Therapy Assessment and Plan  Patient Details  Name: Gary [name] MRN: [id number] Date of Birth: Jun 10, 1950  PT Diagnosis: Abnormal posture, Abnormality of gait, Difficulty walking and Pain in joint Rehab Potential: Excellent ELOS: 7-10 days   Today's Date: 10/03/2020 PT Individual Time: 5093-2671 and 2458-0998 PT Individual Time Calculation (min): 70 min and 72 min    Hospital Problem: Principal Problem:   Debility   Past Medical History:  Past Medical History:  Diagnosis Date  . Accidentally struck by tree 2013   with skull fx, lung contusion, left clavicle Fx, bilateral pelvic Fx.   . Diabetes mellitus without complication ([place])   . Epistaxis 2014   Secondary to a nasal abnormality, treated and resolved  . Hyperlipidemia due to type 2 diabetes mellitus ([place])   . Hypertension    Past Surgical History:  Past Surgical History:  Procedure Laterality Date  . BIOPSY  09/07/2020   Procedure: BIOPSY;  Surgeon: Jackquline Denmark, MD;  Location: [facility] ENDOSCOPY;  Service: Endoscopy;;  . ESOPHAGOGASTRODUODENOSCOPY N/A 09/07/2020   Procedure: ESOPHAGOGASTRODUODENOSCOPY (EGD);  Surgeon: Jackquline Denmark, MD;  Location: [facility] ENDOSCOPY;  Service: Endoscopy;  Laterality: N/A;  Bedside in ICU  . NOSE SURGERY  2014  . PELVIC FRACTURE SURGERY  2013  . RIGHT HEART CATH N/A 09/24/2020   Procedure: RIGHT HEART CATH;  Surgeon: Jolaine Artist, MD;  Location: [place] [facility] LAB;  Service: Cardiovascular;  Laterality: N/A;  . RIGHT/LEFT HEART CATH AND CORONARY ANGIOGRAPHY N/A 09/04/2020   Procedure: RIGHT/LEFT HEART CATH AND CORONARY ANGIOGRAPHY;  Surgeon: Jolaine Artist, MD;  Location: [place] [facility];  Service: Cardiovascular;  Laterality: N/A;    Assessment & Plan Clinical Impression: Patient is a 70 y.o. year old male with history of T2DM, HTN, epistaxis (requiring IR guided embolization), crush injury from tree branch(left clavicle Fx, lung contusion, skull Fx and pelvic Fx); who was  admitted on 09/03/20 with acute hypoxic respiratory failurerequiring 15 L nonrebreather mask as well as IV diuresis for fluid overload. He was noted to be in A. fib and had ST changes with BNP 1202. 2D echo showed EF of 55 to 60% with mild concentric left ventricular hypertrophy, mild MVR,moderate TVR and moderately dilated right atrium.CTA chest showed bilateral pleural effusion with patchy infiltrates and opacity in RML with little change from 06/27/20.he underwent cardiac cath revealing chronically occluded distal RCA, moderate PAH with moderately reduced cardiac outputand nonobstructive CAD.  Hewas started on eliquis butdeveloped acute blood loss anemia with drop in H/H from 13-->6.7on 04/22 due to melena and required intubation due to tenuous respiratory status prior to EGD. Eliquis placed on hold and EGD by Dr. Lyndel Safe revealed large non-bleeding duodenal ulcer, erosive gastritis, short segment Barrett's esophagus. He was treated with IV protonix and transitioned to po PPI bid and has been treated for H pylori. He tolerated extubation on 04/26.Hypotension treated with pressors and he was slowly weaned off vent. Dr. Haroldine Laws consulted to help with management of overload and PAH. He was started on milrionone added to help with diuresis and required IV amiodarone due to recurrent A fib. Sildenafil and Demadex added with midodrine for BP support.   He continued to require HFNC 40L/min and PCCM reconsulted for input and felt symptoms multifactorial due to cor pulmonale, WHO group 3 PAH, infiltrates and overload. CTA chest repeated on 04/29 was negative for PE but revealed progressive bilateral airspace disease greater in BUE felt to favor multifocal infection over edema and R>L pleural effusion. He underwent  thoracocentesis of 1200 cc serosanguinous exudative fluid from right pleural space on 05/03. He continued to be hypoxic and high resolution CT chest 05/05 showed spiculated masslike  opacity anterior RML with adjacent pleural retraction with right hilar and mediastinal lymphadenopathy concerning for malignancy with nodal metastatic disease and no evidence of fibrotic interstitial lung disease. He was started on pulse dose steroids (completed 05/17) with plans for bronch/EBUS in the future. Repeat cardiac cath on 05/09 revaled mild pulmonary HTN with normal filling pressors and bubble study negative. Plans for PFTs and outpatient sleep study. CPAP ordered and patient showing compliance. He has been weaned down to HFNC 5L at rest and 8 L with minimal activity --->drop in oxygenation to low 80's. CIR recommended due to functional deficits.   Patient transferred to CIR on 10/02/2020 .   Patient currently requires min with mobility secondary to muscle weakness, decreased cardiorespiratoy endurance and decreased oxygen support and decreased standing balance, decreased postural control and decreased balance strategies.  Prior to hospitalization, patient was independent  with mobility and lived with Spouse in a House home.  Home access is 2Stairs to enter,Ramped entrance (ramped entrance connected to back porch).  Patient will benefit from skilled PT intervention to maximize safe functional mobility, minimize fall risk and decrease caregiver burden for planned discharge home with intermittent assist.  Anticipate patient will benefit from follow up OP at discharge.  PT - End of Session Activity Tolerance: Tolerates 30+ min activity with multiple rests Endurance Deficit: Yes Endurance Deficit Description: requires 6 L/min O2 with activity to maintain SPO2 >90% PT Assessment Rehab Potential (ACUTE/IP ONLY): Excellent PT Barriers to Discharge: Decreased caregiver support;Home environment access/layout;New oxygen PT Patient demonstrates impairments in the following area(s): Balance;Skin Integrity;Edema;Endurance;Motor;Nutrition;Pain;Perception;Safety PT Transfers Functional Problem(s): Bed  Mobility;Bed to Chair;Car;Furniture;Floor PT Locomotion Functional Problem(s): Ambulation;Wheelchair Mobility;Stairs PT Plan PT Intensity: Minimum of 1-2 x/day ,45 to 90 minutes PT Frequency: 5 out of 7 days PT Duration Estimated Length of Stay: 7-10 days PT Treatment/Interventions: Ambulation/gait training;Community reintegration;DME/adaptive equipment instruction;Neuromuscular re-education;Psychosocial support;Stair training;UE/LE Strength taining/ROM;Wheelchair propulsion/positioning;Balance/vestibular training;Discharge planning;Functional electrical stimulation;Pain management;Skin care/wound management;Therapeutic Activities;UE/LE Coordination activities;Therapeutic Exercise;Splinting/orthotics;Patient/family education;Functional mobility training;Disease management/prevention PT Transfers Anticipated Outcome(s): mod I using LRAD PT Locomotion Anticipated Outcome(s): Mod I household and supervision community with LRAD PT Recommendation Recommendations for Other Services: Therapeutic Recreation consult Therapeutic Recreation Interventions: Stress management Follow Up Recommendations: Outpatient PT;Other (comment) (Pulmonary rehab?) Patient destination: Home Equipment Details: Patient has RW   PT Evaluation Precautions/Restrictions Precautions Precautions: Fall;Other (comment) Precaution Comments: Watch SpO2 Restrictions Weight Bearing Restrictions: No Home Living/Prior Functioning Home Living Available Help at Discharge: Family;Available PRN/intermittently Type of Home: House Home Access: Stairs to enter;Ramped entrance (ramped entrance connected to back porch) Entrance Stairs-Number of Steps: 2 Entrance Stairs-Rails: None Home Layout: One level Bathroom Shower/Tub: Multimedia programmer: Programmer, systems: Yes  Lives With: Spouse Prior Function  Able to Take Stairs?: Yes Driving: Yes Vocation: Retired Comments: Patient reports independent with  ADLs, IADLs, cooking/cleaning. Cares for 10 dogs at home. Retired Arts administrator. Vision/Perception  Baseline Vision/History: Wears glasses Wears Glasses: At all times Patient Visual Report: No change from baseline Vision Assessment?: No apparent visual deficits Cognition Overall Cognitive Status: Within Functional Limits for tasks assessed Orientation Level: Oriented X4 Memory: Appears intact Safety/Judgment: Appears intact Sensation Sensation Light Touch: Appears Intact Coordination Gross Motor Movements are Fluid and Coordinated: No Fine Motor Movements are Fluid and Coordinated: Yes Coordination and Movement Description: generalized deconditioning Motor  Motor Motor: Abnormal postural alignment and control  Motor - Skilled Clinical Observations: geenralized weakness   Trunk/Postural Assessment  Cervical Assessment Cervical Assessment: Exceptions to [facility] (forward head) Thoracic Assessment Thoracic Assessment: Exceptions to [facility] (rounded shoulders) Lumbar Assessment Lumbar Assessment: Exceptions to [facility] (posterior pelvic tilt) Postural Control Postural Control: Deficits on evaluation (decreased/delayed)  Balance Balance Balance Assessed: Yes Standardized Balance Assessment Standardized Balance Assessment: Berg Balance Test Berg Balance Test Sit to Stand: Able to stand  independently using hands Standing Unsupported: Able to stand safely 2 minutes Sitting with Back Unsupported but Feet Supported on Floor or Stool: Able to sit safely and securely 2 minutes Stand to Sit: Controls descent by using hands Transfers: Able to transfer safely, definite need of hands Standing Unsupported with Eyes Closed: Able to stand 10 seconds safely Standing Ubsupported with Feet Together: Able to place feet together independently and stand for 1 minute with supervision From Standing, Reach Forward with Outstretched Arm: Can reach forward >12 cm safely (5") From Standing Position, Pick up Object  from Floor: Able to pick up shoe safely and easily From Standing Position, Turn to Look Behind Over each Shoulder: Looks behind from both sides and weight shifts well Turn 360 Degrees: Able to turn 360 degrees safely but slowly Standing Unsupported, Alternately Place Feet on Step/Stool: Needs assistance to keep from falling or unable to try Standing Unsupported, One Foot in Front: Able to take small step independently and hold 30 seconds Standing on One Leg: Tries to lift leg/unable to hold 3 seconds but remains standing independently Total Score: 40 Dynamic Sitting Balance Dynamic Sitting - Balance Support: No upper extremity supported;Feet supported Dynamic Sitting - Level of Assistance: 6: Modified independent (Device/Increase time) Dynamic Standing Balance Dynamic Standing - Balance Support: During functional activity;No upper extremity supported Dynamic Standing - Level of Assistance: 4: Min assist Extremity Assessment  RLE Assessment RLE Assessment: Within Functional Limits Active Range of Motion (AROM) Comments: WFL for functional mobility General Strength Comments: Grossly 4+/5 throughout LLE Assessment LLE Assessment: Within Functional Limits Active Range of Motion (AROM) Comments: WFL for functional mobility General Strength Comments: Grossly 4+/5 throughout  Care Tool Care Tool Bed Mobility Roll left and right activity   Roll left and right assist level: Independent    Sit to lying activity   Sit to lying assist level: Independent    Lying to sitting edge of bed activity   Lying to sitting edge of bed assist level: Independent     Care Tool Transfers Sit to stand transfer   Sit to stand assist level: Contact Guard/Touching assist Sit to stand assistive device: Walker  Chair/bed transfer   Chair/bed transfer assist level: Contact Guard/Touching assist Chair/bed transfer assistive device: Proofreader transfer assist level:  Administrator, Civil Service   Assist level: Contact Guard/Touching assist Assistive device: Walker-rolling Max distance: 40 ft on 6 L/min O2  Walk 10 feet activity   Assist level: Contact Guard/Touching assist Assistive device: Walker-rolling   Walk 50 feet with 2 turns activity Walk 50 feet with 2 turns activity did not occur: Safety/medical concerns (decreased activity tolerance, oxygen desaturation to 87%)      Walk 150 feet activity Walk 150 feet activity did not occur: Safety/medical concerns      Walk 10 feet on uneven surfaces activity   Assist level: Minimal Assistance - Patient > 75% Assistive device: IT consultant  level: Minimal Assistance - Patient > 75% Stairs assistive device: 2 hand rails Max number of stairs: 4  Walk up/down 1 step activity   Walk up/down 1 step (curb) assist level: Minimal Assistance - Patient > 75% Walk up/down 1 step or curb assistive device: Walker    Walk up/down 4 steps activity Walk up/down 4 steps assist level: Minimal Assistance - Patient > 75% Walk up/down 4 steps assistive device: 2 hand rails  Walk up/down 12 steps activity Walk up/down 12 steps activity did not occur: Safety/medical concerns (decreased activity tolerance)      Pick up small objects from floor   Pick up small object from the floor assist level: Supervision/Verbal cueing    Wheelchair   Type of Wheelchair: Manual   Wheelchair assist level: Supervision/Verbal cueing Max wheelchair distance: >200 ft  Wheel 50 feet with 2 turns activity   Assist Level: Supervision/Verbal cueing  Wheel 150 feet activity   Assist Level: Supervision/Verbal cueing    Refer to Care Plan for Long Term Goals  SHORT TERM GOAL WEEK 1 PT Short Term Goal 1 (Week 1): STG=LTG due to short ELOS  Recommendations for other services: Therapeutic Recreation  Stress management  Skilled Therapeutic Intervention Session 1: Evaluation  completed (see details above and below) with education on PT POC and goals and individual treatment initiated with focus on functional mobility/transfers, LE strength, dynamic standing balance/coordination, ambulation, stair navigation, simulated car transfers, and improved endurance with activity Patient provided with 18"x18" wheelchair with standard cushion and adjustments made to promote optimal seating posture and pressure distribution. Patient also provided with RW for use in room and therapist adjusted to proper height for patient.  Patient in recliner in the room upon PT arrival. Patient alert and agreeable to PT session. Patient denied pain during session.  Vitals: Sitting: BP 103/67, HR 71 (irregular/fluctuating), SPO2: 96% on 5L/min Standing: BP 93/53, HR irregular/fluctuation, SPO2 99% on 5L/min Standing x3 min: BP 93/56, HR irregular/fluctuating, SPO2 97% on 5L/min Patient maintained on 6L/min with mobility with desaturation to 87% with good recovery, see details below End of session in sitting: BP 100/63, HR 71 (irregular/fluctuating), SPO2 98% on 5L/min HR fluctuating/irregular throughout session, RN made aware, reports patient with A-fib since admission and is being managed by medical team.  Therapeutic Activity: Transfers: Patient performed sit to/from stand x3 and stand pivot x2 with CGA progressing to supervision with use of RW at patient's request due to fear of falling. Provided verbal cues for scooting forward, forward weight shift and reaching back to sit for safety. Patient performed a simulated sedab height car transfer with CGA using car frame and step-in technique. Educated on turn-sit technique following by bringing his legs into the vehicle for improved safety and balance with activity, patient stated understanding.    Gait Training:  Patient ambulated 40 feet and 80 feet using RW with CGA-close supervision on 6L/min O2, SPO2 dropped to 87-88% and recovered to >95% <1  min with cues for pursed lip breathing each trial. Ambulated with very slow gait speed, decreased step length and height, increased B hip and knee flexion in stance, forward trunk lean, and downward head gaze. Provided verbal cues for erect posture, paced breathing, and proximity to RW for safety. 6 Min Walk Test:  Instructed patient to ambulate as quickly and as safely as possible for 6 minutes using LRAD. Patient was allowed to take standing rest breaks without stopping the test, but if he required a sitting rest break the clock  would be stopped and the test would be over.  Results: 80 feet, stopping at 3 min 11 sec, using a RW with CGA-close supervision on 6L/min O2. SPO2 88% and RPE 5/10 at end of test. Patient recovered to 97% in 1 min with pursed lip breathing on 6L/min.   Patient ambulated up/down a ramp, over 10 feet of mulch (unlevel surface), and up/down a curb to simulate community ambulation over unlevel surfaces with CGA-min A using RW. Provided cues for technique and use of AD.  Wheelchair Mobility:  Patient propelled wheelchair >200 feet with supervision using B upper extremities.   Patient required increased rest breaks and time to complete tasks due to decreased activity tolerance.  Instructed pt in results of PT evaluation as detailed above, PT POC, rehab potential, rehab goals, and discharge recommendations. Additionally discussed CIR's policies regarding fall safety and use of chair alarm and/or quick release belt. Pt verbalized understanding and in agreement. Will update pt's family members as they become available.   Patient in recliner in the room at end of session with breaks locked, chair alarm set, and all needs within reach.   Session 2: Patient in recliner in the room upon PT arrival. Patient alert and agreeable to PT session. Patient reported 4-5/10 L knee pain at end of session, patient reports hx of L knee pain with OA, RN made aware. PT provided repositioning, rest  breaks, and distraction as pain interventions throughout session.   Therapeutic Activity: Bed Mobility: Patient performed rolling R/L and supine to/from sit independently in a flat bed without use of bed rails.  Transfers: Patient performed sit to/from stand x8 with supervision with and without RW. Provided verbal cues for scooting forward, forward weight shift, and reaching back to sit when using RW. Attempted standing with hands on thighs x1 to focus on forward weight shift. Patient reported increased pain in his knee and was unable to attempt a second time due to L knee pain.   Gait Training:  Increased O2 to 8L/min for gait and stair training for improved oxygen saturations with higher endurance demands. SPO2 89-98% throughout. Patient ambulated 90 feet using RW with close supervision and PT managing O2 tank and bringing w/c behind due to decreased activity tolerance. Ambulated as above. Patient ascended/descended 4-6" steps using B rails with min A-CGA. Performed step-to gait pattern. Provided cues for technique and sequencing. Required a standing rest break at the top before descending steps due to SOB.   Wheelchair Mobility:  Patient was transported in the w/c with total A throughout session for energy conservation and time management.  Neuromuscular Re-ed: Patient performed the Berg Balance Test: Patient demonstrates increased fall risk as noted by score of 40/56 on Berg Balance Scale.  (<36= high risk for falls, close to 100%; 37-45 significant >80%; 46-51 moderate >50%; 52-55 lower >25%) Educated patient on results and interpretation, discussed use of RW for balance and endurance deficits at d/c, patient in agreement.   Patient in recliner in the room at end of session with breaks locked, chair alarm set, and all needs within reach.   Discharge Criteria: Patient will be discharged from PT if patient refuses treatment 3 consecutive times without medical reason, if treatment goals not  met, if there is a change in medical status, if patient makes no progress towards goals or if patient is discharged from hospital.  The above assessment, treatment plan, treatment alternatives and goals were discussed and mutually agreed upon: by patient   L  PT,  DPT  10/03/2020, 3:48 PM

## 2020-10-03 NOTE — Progress Notes (Signed)
PROGRESS NOTE   Subjective/Complaints: Had a pretty good night. Didn't receive CPAP until after 11pm.   ROS: Patient denies fever, rash, sore throat, blurred vision, nausea, vomiting, diarrhea, cough, shortness of breath or chest pain, joint or back pain, headache, or mood change.    Objective:   No results found. Recent Labs    10/02/20 0206 10/03/20 0538  WBC 12.4* 11.0*  HGB 9.9* 9.6*  HCT 31.1* 31.1*  PLT 405* 333   Recent Labs    10/02/20 0206 10/03/20 0538  NA 133* 133*  K 4.3 4.0  CL 92* 94*  CO2 29 33*  GLUCOSE 139* 85  BUN 28* 24*  CREATININE 1.09 0.90  CALCIUM 8.1* 7.8*    Intake/Output Summary (Last 24 hours) at 10/03/2020 0839 Last data filed at 10/03/2020 0736 Gross per 24 hour  Intake 480 ml  Output 975 ml  Net -495 ml        Physical Exam: Vital Signs Blood pressure (!) 101/51, pulse 70, temperature (!) 96.7 F (35.9 C), resp. rate 16, SpO2 100 %.  General: Alert and oriented x 3, No apparent distress HEENT: Head is normocephalic, atraumatic, PERRLA, EOMI, sclera anicteric, oral mucosa pink and moist, dentition intact, ext ear canals clear, O2 5L Neck: Supple without JVD or lymphadenopathy Heart: Reg rate and rhythm. No murmurs rubs or gallops Chest: CTA bilaterally without wheezes, rales, or rhonchi; no distress Abdomen: Soft, non-tender, non-distended, bowel sounds positive. Extremities: No clubbing, cyanosis, or edema. Pulses are 2+ Psych: Pt's affect is appropriate. Pt is cooperative Skin: Clean and intact without signs of breakdown Neuro: Pt is cognitively appropriate with normal insight, memory, and awareness. Cranial nerves 2-12 are intact. Sl HOH. Sensory exam is normal. Reflexes are 2+ in all 4's. Fine motor coordination is intact. No tremors. Motor function is grossly 4/5 UE and 3-4/5 LE prox to distal..  Musculoskeletal: Full ROM, No pain with AROM or PROM in the neck, trunk,  or extremities. Posture appropriate     Assessment/Plan: 1. Functional deficits which require 3+ hours per day of interdisciplinary therapy in a comprehensive inpatient rehab setting.  Physiatrist is providing close team supervision and 24 hour management of active medical problems listed below.  Physiatrist and rehab team continue to assess barriers to discharge/monitor patient progress toward functional and medical goals  Care Tool:  Bathing              Bathing assist       Upper Body Dressing/Undressing Upper body dressing   What is the patient wearing?: Hospital gown only    Upper body assist Assist Level: Set up assist    Lower Body Dressing/Undressing Lower body dressing      What is the patient wearing?: Pants     Lower body assist Assist for lower body dressing: Minimal Assistance - Patient > 75%     Toileting Toileting    Toileting assist Assist for toileting: Minimal Assistance - Patient > 75%     Transfers Chair/bed transfer  Transfers assist           Locomotion Ambulation   Ambulation assist  Walk 10 feet activity   Assist           Walk 50 feet activity   Assist           Walk 150 feet activity   Assist           Walk 10 feet on uneven surface  activity   Assist           Wheelchair     Assist               Wheelchair 50 feet with 2 turns activity    Assist            Wheelchair 150 feet activity     Assist          Blood pressure (!) 101/51, pulse 70, temperature (!) 96.7 F (35.9 C), resp. rate 16, SpO2 100 %.  Medical Problem List and Plan: 1.Functional and mobility deficitssecondary to debility after respiratory failure and multiple associated complications -patient may shower -ELOS/Goals: 7-10 days, mod I to supervision with PT and OT  -Patient is beginning CIR therapies today including PT and OT  2.  Antithrombotics: -DVT/anticoagulation:Pharmaceutical:Other (comment)--Eliquis resumed 05/12 and question plans for [facility] LAA closure. -antiplatelet therapy: N/A 3. Pain Management:N/A 4. Mood:LCSW to follow for evaluation and support. -antipsychotic agents: N/a 5. Neuropsych: This patientiscapable of making decisions on hisown behalf. 6. Skin/Wound Care:Routine pressure relief measures. 7. Fluids/Electrolytes/Nutrition:encourage PO  -I personally reviewed the patient's labs today.    -encourage appropriate fluids 8. OSA/Hypoxic respiratory failure: Completed pulse dose steroids on 05/17.  --pulmonary hygiene with flutter valve. CPAP. Yupleri nebs/day. Pulmicort nebs bid.  9. PAH with cor pulmonale: Felt to be in setting of hypoxic lung disease/OHS/OSA -On sildenafil, torsemide, Jardiance and midodrine for BP support.  10. GIB due to gastritis/duodenal ulcers/short segment Barrett's: Now on once daily Protonix.  --Back on DOAC. Monitor for any signs of recurrent bleeding.  11. A fib: Monitor HR tid--controlled on amiodarone and now back on Eliquis.  12. Hyponatremia: mild, stable at 133 13. AKI: Has resolved with normalization of SCr and will likely need to stay dry.   -BUN/Cr stable 14. Hypomagnesemia: Improved with IV supplementation.   -1.9 today  -daily oral supplement 15. Spiculated RML mass: Follow up with PCCM for further work up after d/c. 16. T2DM: Well controlled with Hgb A1c- 6.7. Continue metformin 1000 mg bid.  --Monitor BS ac/hs and use SSI for elevated BS.  17. Leucocytosis: Likely reactive due to steroids---11k 5/18 --Monitor for signs of infection/recovery.     LOS: 1 days A FACE TO [place] 10/03/2020, 8:39 AM

## 2020-10-03 NOTE — Discharge Instructions (Addendum)
Inpatient Rehab Discharge Instructions  Gary [name] Discharge date and time: No discharge date for patient encounter.   Activities/Precautions/ Functional Status: Activity: activity as tolerated Diet: diabetic diet Wound Care: Routine skin checks Functional status:  ___ No restrictions     ___ Walk up steps independently ___ 24/7 supervision/assistance   ___ Walk up steps with assistance ___ Intermittent supervision/assistance  ___ Bathe/dress independently ___ Walk with walker     _x__ Bathe/dress with assistance ___ Walk Independently    ___ Shower independently ___ Walk with assistance    ___ Shower with assistance ___ No alcohol     ___ Return to work/school ________  COMMUNITY REFERRALS UPON DISCHARGE:     Outpatient: PT     OT             Agency: [facility] Phone: [phone]             Appointment Date/Time:*Please expect follow-up within 7-10 business days to discuss scheduling appointment. If you have not received follow-up, be sure to contact the branch directly.*  Medical Equipment/Items Ordered: oxygen, triology machine                                                 Agency/Supplier: [facility] [phone]    Special Instructions:  No driving smoking or alcohol  Continue oxygen therapy as directed.  CPAP as directed  My questions have been answered and I understand these instructions. I will adhere to these goals and the provided educational materials after my discharge from the hospital.  Patient/Caregiver Signature _______________________________ Date __________  Clinician Signature _______________________________________ Date __________  Please bring this form and your medication list with you to all your follow-up doctor's appointments. Information on my medicine - ELIQUIS (apixaban)  This medication education was reviewed with me or my healthcare representative as part of my discharge preparation.    Why was Eliquis  prescribed for you? Eliquis was prescribed for you to reduce the risk of a blood clot forming that can cause a stroke if you have a medical condition called atrial fibrillation (a type of irregular heartbeat).  What do You need to know about Eliquis ? Take your Eliquis TWICE DAILY - one tablet in the morning and one tablet in the evening with or without food. If you have difficulty swallowing the tablet whole please discuss with your pharmacist how to take the medication safely.  Take Eliquis exactly as prescribed by your doctor and DO NOT stop taking Eliquis without talking to the doctor who prescribed the medication.  Stopping may increase your risk of developing a stroke.  Refill your prescription before you run out.  After discharge, you should have regular check-up appointments with your healthcare provider that is prescribing your Eliquis.  In the future your dose may need to be changed if your kidney function or weight changes by a significant amount or as you get older.  What do you do if you miss a dose? If you miss a dose, take it as soon as you remember on the same day and resume taking twice daily.  Do not take more than one dose of ELIQUIS at the same time to make up a missed dose.  Important Safety Information A possible side effect of Eliquis is bleeding. You should call your healthcare provider right away if you experience any  of the following: ? Bleeding from an injury or your nose that does not stop. ? Unusual colored urine (red or dark brown) or unusual colored stools (red or black). ? Unusual bruising for unknown reasons. ? A serious fall or if you hit your head (even if there is no bleeding).  Some medicines may interact with Eliquis and might increase your risk of bleeding or clotting while on Eliquis. To help avoid this, consult your healthcare provider or pharmacist prior to using any new prescription or non-prescription medications, including herbals, vitamins,  non-steroidal anti-inflammatory drugs (NSAIDs) and supplements.  This website has more information on Eliquis (apixaban): http://www.eliquis.com/eliquis/home

## 2020-10-03 NOTE — Evaluation (Signed)
Occupational Therapy Assessment and Plan  Patient Details  Name: Gary [name] MRN: [id number] Date of Birth: 02-Oct-1950  OT Diagnosis: abnormal posture and muscle weakness (generalized) Rehab Potential: Rehab Potential (ACUTE ONLY): Good ELOS: 7-10   Today's Date: 10/03/2020 OT Individual Time: 6195-0932 OT Individual Time Calculation (min): 60 min     Hospital Problem: Principal Problem:   Debility   Past Medical History:  Past Medical History:  Diagnosis Date  . Accidentally struck by tree 2013   with skull fx, lung contusion, left clavicle Fx, bilateral pelvic Fx.   . Diabetes mellitus without complication ([place])   . Epistaxis 2014   Secondary to a nasal abnormality, treated and resolved  . Hyperlipidemia due to type 2 diabetes mellitus ([place])   . Hypertension    Past Surgical History:  Past Surgical History:  Procedure Laterality Date  . BIOPSY  09/07/2020   Procedure: BIOPSY;  Surgeon: Jackquline Denmark, MD;  Location[facility] ENDOSCOPY;  Service: Endoscopy;;  . ESOPHAGOGASTRODUODENOSCOPY N/A 09/07/2020   Procedure: ESOPHAGOGASTRODUODENOSCOPY (EGD);  Surgeon: Jackquline Denmark, MD;  Location: [facility] ENDOSCOPY;  Service: Endoscopy;  Laterality: N/A;  Bedside in ICU  . NOSE SURGERY  2014  . PELVIC FRACTURE SURGERY  2013  . RIGHT HEART CATH N/A 09/24/2020   Procedure: RIGHT HEART CATH;  Surgeon: Jolaine Artist, MD;  Location: [place] CV LAB;  Service: Cardiovascular;  Laterality: N/A;  . RIGHT/LEFT HEART CATH AND CORONARY ANGIOGRAPHY N/A 09/04/2020   Procedure: RIGHT/LEFT HEART CATH AND CORONARY ANGIOGRAPHY;  Surgeon: Jolaine Artist, MD;  Location: [place] CV LAB;  Service: Cardiovascular;  Laterality: N/A;    Assessment & Plan Clinical Impression:  70 y/o male who presents on 09/03/20 with progressive SOB. Found to have acute respiratory failure, likely due to new onset of heart failure and A-fib with RVR. CXR with R-side effusion, prominent interstitial edema/disease. S/p  cardiac cath 4/19. Pt with hypotension 4/21 and drop in hgb on 4/22. Workup for GIB with intubation and urgent bedside EGD on 4/22 with PT sign off. Extubated on 4/26. PT reordered on 4/27. Cardiac cath 5/9 with no significant findings. Weaned off HHFNC to 5L HFNC on 5/11. PMH includes HTN, DM.   Patient currently requires min with basic self-care skills secondary to muscle weakness, decreased cardiorespiratoy endurance and decreased oxygen support and decreased standing balance, decreased postural control and decreased balance strategies.  Prior to hospitalization, patient could complete BADL/IADL with independent .  Patient will benefit from skilled intervention to decrease level of assist with basic self-care skills and increase independence with basic self-care skills prior to discharge home with care partner.  Anticipate patient will require intermittent supervision and no further OT follow recommended.  OT - End of Session Activity Tolerance: Tolerates 30+ min activity with multiple rests Endurance Deficit: Yes OT Assessment Rehab Potential (ACUTE ONLY): Good OT Barriers to Discharge: New oxygen OT Patient demonstrates impairments in the following area(s): Balance;Endurance;Safety;Skin Integrity OT Basic ADL's Functional Problem(s): Bathing;Dressing;Toileting;Grooming OT Transfers Functional Problem(s): Toilet;Tub/Shower OT Plan OT Intensity: Minimum of 1-2 x/day, 45 to 90 minutes OT Frequency: 5 out of 7 days OT Duration/Estimated Length of Stay: 7-10 OT Treatment/Interventions: Balance/vestibular training;Discharge planning;Pain management;Self Care/advanced ADL retraining;Therapeutic Activities;UE/LE Coordination activities;Therapeutic Exercise;Skin care/wound managment;Patient/family education;Functional mobility training;Disease mangement/prevention;Community reintegration;DME/adaptive equipment instruction;Neuromuscular re-education;Psychosocial support;Splinting/orthotics;UE/LE  Strength taining/ROM OT Self Feeding Anticipated Outcome(s): no goal OT Basic Self-Care Anticipated Outcome(s): MOD I OT Toileting Anticipated Outcome(s): MOD I; S shower OT Bathroom Transfers Anticipated Outcome(s): MOD I toilwt; S shower OT  Recommendation Patient destination: Home Follow Up Recommendations: None Equipment Recommended: None recommended by OT Equipment Details: pt has shower chair and BSc   OT Evaluation Precautions/Restrictions  Precautions Precautions: Fall;Other (comment) Precaution Comments: Watch SpO2 Restrictions Weight Bearing Restrictions: No General Chart Reviewed: No Family/Caregiver Present: No Vital Signs Therapy Vitals Temp: (!) 96.7 F (35.9 C) Pulse Rate: 70 Resp: 16 BP: (!) 101/51 Patient Position (if appropriate): Lying Oxygen Therapy SpO2: 100 % O2 Device: CPAP Pain Pain Assessment Pain Scale: 0-10 Pain Score: 0-No pain Home Living/Prior Functioning Home Living Family/patient expects to be discharged to:: Private residence Living Arrangements: Spouse/significant other Available Help at Discharge: Family,Available PRN/intermittently Type of Home: House Home Access: Stairs to enter,Ramped entrance [employer] of Steps: 2 Entrance Stairs-Rails: None Home Layout: One level Bathroom Shower/Tub: Multimedia programmer: Standard Bathroom Accessibility: Yes Prior Function Comments: Per Chart: Patient reports independent with ADLs, IADLs, cooking/cleaning. Cares for 10 dogs at home. Retired Arts administrator. Vision Baseline Vision/History: Wears glasses Wears Glasses: At all times Patient Visual Report: No change from baseline Vision Assessment?: No apparent visual deficits Perception    WFL Praxis   WFL Cognition Overall Cognitive Status: Within Functional Limits for tasks assessed Orientation Level: Place;Situation;Person Person: Oriented Place: Oriented Situation: Oriented Year: 2022 Month: May Day of Week:  Correct Memory: Appears intact Immediate Memory Recall: Sock;Blue;Bed Memory Recall Sock: Without Cue Memory Recall Blue: Without Cue Memory Recall Bed: Without Cue Sensation Sensation Light Touch: Appears Intact Coordination Gross Motor Movements are Fluid and Coordinated: Yes Fine Motor Movements are Fluid and Coordinated: Yes Motor  Motor Motor: Abnormal postural alignment and control Motor - Skilled Clinical Observations: geenralized weakness  Trunk/Postural Assessment  Cervical Assessment Cervical Assessment:  (head forward) Thoracic Assessment Thoracic Assessment:  (kyphotic) Lumbar Assessment Lumbar Assessment:  (post pelvic tilt) Postural Control Postural Control: Deficits on evaluation  Balance Balance Balance Assessed: Yes Dynamic Sitting Balance Dynamic Sitting - Level of Assistance: 6: Modified independent (Device/Increase time) Dynamic Standing Balance Dynamic Standing - Level of Assistance: 4: Min assist Extremity/Trunk Assessment RUE Assessment General Strength Comments: generalized weakness LUE Assessment General Strength Comments: generalized weakness  Care Tool Care Tool Self Care Eating   Eating Assist Level: Independent    Oral Care    Oral Care Assist Level: Independent    Bathing   Body parts bathed by patient: Right arm;Left arm;Abdomen;Chest;Front perineal area;Buttocks;Right upper leg;Left upper leg;Right lower leg;Left lower leg;Face     Assist Level: Minimal Assistance - Patient > 75%    Upper Body Dressing(including orthotics)   What is the patient wearing?: Pull over shirt   Assist Level: Set up assist    Lower Body Dressing (excluding footwear)   What is the patient wearing?: Pants Assist for lower body dressing: Minimal Assistance - Patient > 75%    Putting on/Taking off footwear   What is the patient wearing?: Shoes;Ted hose Assist for footwear: Minimal Assistance - Patient > 75%       Care Tool Toileting Toileting  activity   Assist for toileting: Minimal Assistance - Patient > 75%     Care Tool Bed Mobility Roll left and right activity        Sit to lying activity        Lying to sitting edge of bed activity         Care Tool Transfers Sit to stand transfer   Sit to stand assist level: Minimal Assistance - Patient > 75%    Chair/bed transfer  Chair/bed transfer assist level: Minimal Assistance - Patient > 75%     Toilet transfer   Assist Level: Minimal Assistance - Patient > 75%     Care Tool Cognition Expression of Ideas and Wants     Understanding Verbal and Non-Verbal Content     Memory/Recall Ability *first 3 days only      Refer to Care Plan for Long Term Goals  SHORT TERM GOAL WEEK 1 OT Short Term Goal 1 (Week 1): STG=LTG d/t ELOS  Recommendations for other services: Therapeutic Recreation  Outing/community reintegration   Skilled Therapeutic Intervention 1:1. Pt educated on role/purpose of OT, CIR, ELOS and POC. Pt particiaptes on BADL at ambulatory/shower level with RW as stated below. Pts biggest limiting factor is poor oxygen support needing up to 6L on O2 via [place] for recovery after dipping to 86% at lowest point after shower. Exited session with pt seated in bed, exit alarm on and call light in reach   ADL ADL Eating: Independent Where Assessed-Eating: Edge of bed Grooming: Independent Where Assessed-Grooming: Sitting at sink Upper Body Bathing: Setup Where Assessed-Upper Body Bathing: Shower Lower Body Bathing: Minimal assistance Where Assessed-Lower Body Bathing: Shower Upper Body Dressing: Setup Where Assessed-Upper Body Dressing: Chair Lower Body Dressing: Minimal assistance Where Assessed-Lower Body Dressing: Chair Toileting: Minimal assistance Where Assessed-Toileting: Glass blower/designer: Psychiatric nurse Method: Counselling psychologist: Energy manager: Environmental health practitioner Method: Heritage manager: Civil engineer, contracting with back Mobility  Transfers Sit to Stand: Minimal Assistance - Patient > 75% Stand to Sit: Minimal Assistance - Patient > 75%   Discharge Criteria: Patient will be discharged from OT if patient refuses treatment 3 consecutive times without medical reason, if treatment goals not met, if there is a change in medical status, if patient makes no progress towards goals or if patient is discharged from hospital.  The above assessment, treatment plan, treatment alternatives and goals were discussed and mutually agreed upon: by patient  Tonny Branch 10/03/2020, 9:00 AM

## 2020-10-03 NOTE — Progress Notes (Signed)
Inpatient Rehabilitation Medication Review by a Pharmacist  A complete drug regimen review was completed for this patient to identify any potential clinically significant medication issues.  Clinically significant medication issues were identified:  yes   Type of Medication Issue Identified Description of Issue Urgent (address now) Non-Urgent (address on AM team rounds) Plan Plan Accepted by Provider? (Yes / No / Pending AM Rounds)  Drug Interaction(s) (clinically significant)       Duplicate Therapy       Allergy       No Medication Administration End Date       Incorrect Dose       Additional Drug Therapy Needed  Pt on Arformoterol nebs during inpatient admission. Non-Urgent Message sent to [facility]   Other         Name of provider notified for urgent issues identified: Algis Liming  Provider Method of Notification: secure chat message   For non-urgent medication issues to be resolved on team rounds tomorrow morning a [facility] Secure Chat Handoff was sent to: n/a   Pharmacist comments: n/a  Time spent performing this drug regimen review (minutes):  20   Gary [name], Gary [name] 10/03/2020 10:19 AM

## 2020-10-04 LAB — GLUCOSE, CAPILLARY
Glucose-Capillary: 106 mg/dL — ABNORMAL HIGH (ref 70–99)
Glucose-Capillary: 181 mg/dL — ABNORMAL HIGH (ref 70–99)
Glucose-Capillary: 171 mg/dL — ABNORMAL HIGH (ref 70–99)
Glucose-Capillary: 113 mg/dL — ABNORMAL HIGH (ref 70–99)

## 2020-10-04 NOTE — Care Management (Signed)
Inpatient [facility] Individual Statement of Services  Patient Name:  Gary [name]  Date:  10/04/2020  Welcome to the [facility].  Our goal is to provide you with an individualized program based on your diagnosis and situation, designed to meet your specific needs.  With this comprehensive rehabilitation program, you will be expected to participate in at least 3 hours of rehabilitation therapies Monday-Friday, with modified therapy programming on the weekends.  Your rehabilitation program will include the following services:  Physical Therapy (PT), Occupational Therapy (OT), 24 hour per day rehabilitation nursing, Therapeutic Recreaction (TR), Psychology, Neuropsychology, Care Coordinator, Rehabilitation Medicine, Nutrition Services, Pharmacy Services and Other  Weekly team conferences will be held on Tuesdays to discuss your progress.  Your Inpatient Rehabilitation Care Coordinator will talk with you frequently to get your input and to update you on team discussions.  Team conferences with you and your family in attendance may also be held.  Expected length of stay: 7-10 days   Overall anticipated outcome: Independent with an Assistive device to Supervision  Depending on your progress and recovery, your program may change. Your Inpatient Rehabilitation Care Coordinator will coordinate services and will keep you informed of any changes. Your Inpatient Rehabilitation Care Coordinator's name and contact numbers are listed  below.  The following services may also be recommended but are not provided by the [facility] will be made to provide these services after discharge if needed.  Arrangements include referral to agencies that provide these services.  Your insurance has been verified to be:  Medicare  A/B  Your primary doctor is:  Kassie Mends  Pertinent information will be shared with your doctor and your insurance company.  Inpatient Rehabilitation Care Coordinator:  Cathleen Corti [phone] or ([phone]  Information discussed with and copy given to patient by: Rana Snare, 10/04/2020, 12:20 PM

## 2020-10-04 NOTE — Progress Notes (Addendum)
PROGRESS NOTE   Subjective/Complaints: No problems overnight except for fit of cpap mask. Had a good day with therapies  ROS: Patient denies fever, rash, sore throat, blurred vision, nausea, vomiting, diarrhea, cough,   chest pain, joint or back pain, headache, or mood change.     Objective:   No results found. Recent Labs    10/02/20 0206 10/03/20 0538  WBC 12.4* 11.0*  HGB 9.9* 9.6*  HCT 31.1* 31.1*  PLT 405* 333   Recent Labs    10/02/20 0206 10/03/20 0538  NA 133* 133*  K 4.3 4.0  CL 92* 94*  CO2 29 33*  GLUCOSE 139* 85  BUN 28* 24*  CREATININE 1.09 0.90  CALCIUM 8.1* 7.8*    Intake/Output Summary (Last 24 hours) at 10/04/2020 1036 Last data filed at 10/04/2020 0842 Gross per 24 hour  Intake 720 ml  Output 1950 ml  Net -1230 ml        Physical Exam: Vital Signs Blood pressure 98/61, pulse 75, temperature 97.8 F (36.6 C), resp. rate 16, SpO2 98 %.  Constitutional: No distress . Vital signs reviewed. HEENT: EOMI, oral membranes moist Neck: supple Cardiovascular: RRR without murmur. No JVD    Respiratory/Chest: CTA Bilaterally without wheezes or rales. Normal effort    GI/Abdomen: BS +, non-tender, non-distended Ext: no clubbing, cyanosis, or edema Psych: pleasant and cooperative Skin: scab on bridge of nose. Reddened area proximally as well. Neuro: Pt is cognitively appropriate with normal insight, memory, and awareness. Cranial nerves 2-12 are intact. Sl HOH. Sensory exam is normal. Reflexes are 2+ in all 4's. Fine motor coordination is intact. No tremors. Motor function is grossly 4/5 UE and 3-4/5 LE prox to distal..  Musculoskeletal: Full ROM, No pain with AROM or PROM in the neck, trunk, or extremities. Posture appropriate     Assessment/Plan: 1. Functional deficits which require 3+ hours per day of interdisciplinary therapy in a comprehensive inpatient rehab setting.  Physiatrist is  providing close team supervision and 24 hour management of active medical problems listed below.  Physiatrist and rehab team continue to assess barriers to discharge/monitor patient progress toward functional and medical goals  Care Tool:  Bathing    Body parts bathed by patient: Right arm,Left arm,Abdomen,Chest,Front perineal area,Buttocks,Right upper leg,Left upper leg,Right lower leg,Left lower leg,Face         Bathing assist Assist Level: Minimal Assistance - Patient > 75%     Upper Body Dressing/Undressing Upper body dressing   What is the patient wearing?: Pull over shirt    Upper body assist Assist Level: Set up assist    Lower Body Dressing/Undressing Lower body dressing      What is the patient wearing?: Pants     Lower body assist Assist for lower body dressing: Minimal Assistance - Patient > 75%     Toileting Toileting    Toileting assist Assist for toileting: Independent with assistive device Assistive Device Comment: urinal   Transfers Chair/bed transfer  Transfers assist     Chair/bed transfer assist level: Supervision/Verbal cueing Chair/bed transfer assistive device: Programmer, multimedia   Ambulation assist      Assist level: Contact Guard/Touching  assist Assistive device: Walker-rolling Max distance: 40 ft on 6 L/min O2   Walk 10 feet activity   Assist     Assist level: Contact Guard/Touching assist Assistive device: Walker-rolling   Walk 50 feet activity   Assist Walk 50 feet with 2 turns activity did not occur: Safety/medical concerns (decreased activity tolerance, oxygen desaturation to 87%)         Walk 150 feet activity   Assist Walk 150 feet activity did not occur: Safety/medical concerns         Walk 10 feet on uneven surface  activity   Assist     Assist level: Minimal Assistance - Patient > 75% Assistive device: Aeronautical engineer   Type of Wheelchair: Manual     Wheelchair assist level: Supervision/Verbal cueing Max wheelchair distance: >200 ft    Wheelchair 50 feet with 2 turns activity    Assist        Assist Level: Supervision/Verbal cueing   Wheelchair 150 feet activity     Assist      Assist Level: Supervision/Verbal cueing   Blood pressure 98/61, pulse 75, temperature 97.8 F (36.6 C), resp. rate 16, SpO2 98 %.  Medical Problem List and Plan: 1.Functional and mobility deficitssecondary to debility after respiratory failure and multiple associated complications -patient may shower -ELOS/Goals: 7-10 days, mod I to supervision with PT and OT  -Continue CIR therapies including PT, OT  2. Antithrombotics: -DVT/anticoagulation:Pharmaceutical:Other (comment)--Eliquis resumed 05/12 and question plans for [facility] closure. -antiplatelet therapy: N/A 3. Pain Management:N/A 4. Mood:LCSW to follow for evaluation and support. -antipsychotic agents: N/a 5. Neuropsych: This patientiscapable of making decisions on hisown behalf. 6. Skin/Wound Care:Routine pressure relief measures. 7. Fluids/Electrolytes/Nutrition:encourage PO 8. OSA/Hypoxic respiratory failure: Completed pulse dose steroids on 05/17.  --pulmonary hygiene with flutter valve. CPAP. Yupleri nebs/day. Pulmicort nebs bid.  -weaning oxygen as possible  -pt working with RT on appropriate fitting face mask. Will need foam dressing on nose to avoid further pressure breakdown from mask. 9. PAH with cor pulmonale: Felt to be in setting of hypoxic lung disease/OHS/OSA -On sildenafil, torsemide, Jardiance and midodrine for BP support.  10. GIB due to gastritis/duodenal ulcers/short segment Barrett's: Now on once daily Protonix.  --Back on DOAC. Monitor for any signs of recurrent bleeding.  11. A fib: Monitor HR tid--controlled on amiodarone and now back on Eliquis.   12. Hyponatremia: mild, stable at 133 13. AKI: Has resolved with normalization of SCr and will likely need to stay dry.   -BUN/Cr stable on recent labs 68. Hypomagnesemia: Improved with IV supplementation.   -1.9    -daily oral supplement 15. Spiculated RML mass: Follow up with PCCM for further work up after d/c. 16. T2DM:   Hgb A1c- 6.7. Continue, jardiance, metformin 1000 mg bid.   CBG (last 3)  Recent Labs    10/03/20 1621 10/03/20 2054 10/04/20 0604  GLUCAP 174* 206* 106*   -may need to consider adding another agent   --Monitor BS ac/hs and use SSI for elevated BS.  17. Leucocytosis: Likely reactive due to steroids---11k 5/18 --Monitor for signs of infection/recovery.     LOS: 2 days A FACE TO [place] 10/04/2020, 10:35 AM

## 2020-10-04 NOTE — Progress Notes (Signed)
Physical Therapy Session Note  Patient Details  Name: Gary [name] MRN: [id number] Date of Birth: Jul 11, 1950  Today's Date: 10/04/2020 PT Individual Time: 5830-9407 PT Individual Time Calculation (min): 70 min   Short Term Goals: Week 1:  PT Short Term Goal 1 (Week 1): STG=LTG due to short ELOS  Skilled Therapeutic Interventions/Progress Updates:     Patient in recliner in the room upon PT arrival. Patient alert and agreeable to PT session. Patient reported 3/10 B knee pain during session, RN made aware. PT provided repositioning, rest breaks, and distraction as pain interventions throughout session.   Patient maintained on 5L/min at rest and with standing activities and 8L/min during endurance/gait training for increased distance and duration of activity. SPO2 dropped to low 80's following activity and recovered to >92% <2 min with cues for pursed lip breathing.  Therapeutic Activity: Transfers: Patient performed sit to/from stand x6 with supervision with and without RW from recliner, w/c, and outdoor bench. Provided verbal cues for reaching back to sit for controlled descent. Patient performed an ambulatory transfer to/form the bathroom. Patient voided in standing with CGA for balance/safety. Performed hand hygiene independently after with close supervision for safety.   Wheelchair Mobility:  Patient was transported in the w/c with total A to from [facility] and on the unit throughout session for energy conservation and time management.  Neuromuscular Re-ed: Patient performed the following activities: Standing balance 5x1-2 min reaching for visual target, progression to grasping and handing off object with progressing further away from BOS, including lifting onto toes and picking object from the floor  Therapeutic Exercise: Patient performed the following exercises with verbal and tactile cues for proper technique. Ambulation for increased endurance with a functional activity 85  feet and 60 feet over unlevel/outdoor surfaces and 155 feet over level tile using RW with close supervision and w/c follow for longer distances due to decreased activity tolerance. Provided verbal cues for paced breathing, pacing steps, erect posture for increased.  Patient required increased time and rest breaks due to decreased activity tolerance and O2 desaturation during session. Provided education on energy conservation techniques and management of caring for his 10 dogs safely at home.  Patient in recliner in the room at end of session with breaks locked and all needs within reach.    Therapy Documentation Precautions:  Precautions Precautions: Fall,Other (comment) Precaution Comments: Watch SpO2 Restrictions Weight Bearing Restrictions: No   Therapy/Group: Individual Therapy  Kelcey Wickstrom L Donell Sliwinski PT, DPT  10/04/2020, 12:48 PM

## 2020-10-04 NOTE — Progress Notes (Signed)
Occupational Therapy Session Note  Patient Details  Name: Gary [name] MRN: [id number] Date of Birth: 06-30-1950  Today's Date: 10/04/2020 OT Individual Time: 8115-7262 OT Individual Time Calculation (min): 72 min    Today's Date: 10/04/2020 OT Individual Time: 1115-1200 OT Individual Time Calculation (min): 45 min    Short Term Goals: Week 1:  OT Short Term Goal 1 (Week 1): STG=LTG d/t ELOS  Skilled Therapeutic Interventions/Progress Updates:    Pt received in EOB with no pain reported. Pt agreeable to complete BADL at shower level. Pt on 5L O2 at EOB resting at 88-89% O2 increasing to low 90s with pursed lip breathing. ADL:  Overall BADL performance completed at CGA-set up level with RW at ambulatory level. Pt wife already set out clothing. VC for draping clothing over walker to tranport into bathroom at ambulatory level. Pt on 6L O2 for all mobility rebounding to 90s after ~1 min of pursed lip breathing with MIN vc.  Increased time for rest and recovery for O2 levels after all BADL tasks/transfers. Pt  HR increased into 120s with activity and pt requires cuing for quiet seated rest break d/t tendency to talk. OT dmeo use of bag over foot to don teds. Pt able to don shoes and will trial ted donning tomorrow.  Pt left at end of session in recliner with exit alarm on, call light in reach and all needs met  Session 2: Pt received in recliner agreeable to OT with pt agreeable to rearrange schedule. Pt completes item retrieval at ambulatory level with reacher, reacher bag, and (handout about) walker tray. Pt gathers items from all levels floor-head height with S (A to manage O2 tank. Pt requires seated rest lasting 1.5 min on 6L O2 to rebound back into 90s. Pt transfers into walk in shower practicing crossing threshold posterior method with RW and couch transfer to simulate home environment. Exited session with pt seated in bed, exit alarm on and call light in reach   Therapy  Documentation Precautions:  Precautions Precautions: Fall,Other (comment) Precaution Comments: Watch SpO2 Restrictions Weight Bearing Restrictions: No General:   Vital Signs: Therapy Vitals Temp: 97.8 F (36.6 C) Pulse Rate: 73 Resp: 16 BP: 98/61 Patient Position (if appropriate): Lying Oxygen Therapy SpO2: 94 % O2 Device: CPAP O2 Flow Rate (L/min): 5 L/min Pain:   ADL: ADL Eating: Independent Where Assessed-Eating: Edge of bed Grooming: Independent Where Assessed-Grooming: Sitting at sink Upper Body Bathing: Setup Where Assessed-Upper Body Bathing: Shower Lower Body Bathing: Minimal assistance Where Assessed-Lower Body Bathing: Shower Upper Body Dressing: Setup Where Assessed-Upper Body Dressing: Chair Lower Body Dressing: Minimal assistance Where Assessed-Lower Body Dressing: Chair Toileting: Minimal assistance Where Assessed-Toileting: Glass blower/designer: Psychiatric nurse Method: Counselling psychologist: Energy manager: Environmental education officer Method: Heritage manager: Civil engineer, contracting with back Glass blower/designer   Exercises:   Other Treatments:     Therapy/Group: Individual Therapy  Tonny Branch 10/04/2020, 6:47 AM

## 2020-10-05 DIAGNOSIS — R5381 Other malaise: Secondary | ICD-10-CM | POA: Diagnosis not present

## 2020-10-05 LAB — GLUCOSE, CAPILLARY
Glucose-Capillary: 119 mg/dL — ABNORMAL HIGH (ref 70–99)
Glucose-Capillary: 197 mg/dL — ABNORMAL HIGH (ref 70–99)
Glucose-Capillary: 170 mg/dL — ABNORMAL HIGH (ref 70–99)
Glucose-Capillary: 107 mg/dL — ABNORMAL HIGH (ref 70–99)

## 2020-10-05 NOTE — IPOC Note (Signed)
Overall Plan of Care [facility]) Patient Details Name: Xabi Wittler MRN: [id number] DOB: [birth year]  Admitting Diagnosis: [facility] Problems: Principal Problem:   Debility     Functional Problem List: Nursing Endurance,Medication Management,Safety,Pain,Edema  PT Balance,Skin Integrity,Edema,Endurance,Motor,Nutrition,Pain,Perception,Safety  OT Balance,Endurance,Safety,Skin Integrity  SLP    TR         Basic ADL's: OT Bathing,Dressing,Toileting,Grooming     Advanced  ADL's: OT       Transfers: PT Bed Mobility,Bed to [place]  OT Toilet,Tub/Shower     Locomotion: PT Ambulation,Wheelchair Mobility,Stairs     Additional Impairments: OT    SLP        TR      Anticipated Outcomes Item Anticipated Outcome  Self Feeding no goal  Swallowing      Basic self-care  MOD I  Toileting  MOD I; S shower   Bathroom Transfers MOD I toilwt; S shower  Bowel/Bladder  n/a  Transfers  mod I using LRAD  Locomotion  Mod I household and supervision community with LRAD  Communication     Cognition     Pain  pain at or below level 4  Safety/Judgment  maintain safety with cues/reminders   Therapy Plan: PT Intensity: Minimum of 1-2 x/day ,45 to 90 minutes PT Frequency: 5 out of 7 days PT Duration Estimated Length of Stay: 7-10 days OT Intensity: Minimum of 1-2 x/day, 45 to 90 minutes OT Frequency: 5 out of 7 days OT Duration/Estimated Length of Stay: 7-10     Due to the current state of emergency, patients may not be receiving their 3-hours of Medicare-mandated therapy.   Team Interventions: Nursing Interventions Medication Management,Disease Management/Prevention,Pain Management,Patient/Family Education,Discharge Planning  PT interventions Ambulation/gait training,Community reintegration,DME/adaptive equipment instruction,Neuromuscular re-education,Psychosocial support,Stair training,UE/LE Strength taining/ROM,Wheelchair  propulsion/positioning,Balance/vestibular training,Discharge planning,Functional electrical stimulation,Pain management,Skin care/wound management,Therapeutic Activities,UE/LE Coordination activities,Therapeutic Exercise,Splinting/orthotics,Patient/family education,Functional mobility training,Disease management/prevention  OT Interventions Balance/vestibular training,Discharge planning,Pain management,Self Care/advanced ADL retraining,Therapeutic Activities,UE/LE Coordination activities,Therapeutic Exercise,Skin care/wound managment,Patient/family education,Functional mobility training,Disease Programmer, applications re-education,Psychosocial support,Splinting/orthotics,UE/LE Strength taining/ROM  SLP Interventions    TR Interventions    SW/CM Interventions Discharge Planning,Psychosocial Support,Patient/Family Education   Barriers to Discharge MD  Medical stability  Nursing Decreased caregiver support,New oxygen 1 level ramped back entrance/1 ste front entrance with wife who works day shift  PT Decreased caregiver support,Home environment access/layout,New oxygen    OT New oxygen    SLP      SW       Team Discharge Planning: Destination: PT-Home ,OT- Home , SLP-  Projected Follow-up: PT-Outpatient PT,Other (comment) (Pulmonary rehab?), OT-  None, SLP-  Projected Equipment Needs: PT- , OT- None recommended by OT, SLP-  Equipment Details: PT-Patient has RW, OT-pt has shower chair and BSc Patient/family involved in discharge planning: PT- Patient,  OT-Patient, SLP-   MD ELOS: 7-10 days Medical Rehab Prognosis:  Excellent Assessment: The patient has been admitted for CIR therapies with the diagnosis of debility after respiratory failure and multiple medical. The team will be addressing functional mobility, strength, stamina, balance, safety, adaptive techniques and equipment, self-care, bowel and bladder mgt, patient and  caregiver education, breathing techniques, respiratory stamina, breathing techniques. Goals have been set at mod I with mobility and self-care.   Due to the current state of emergency, patients may not be receiving their 3 hours per day of Medicare-mandated therapy.    [name], MD, FAAPMR      See Team Conference Notes for weekly updates to the plan of care

## 2020-10-05 NOTE — Progress Notes (Addendum)
Patient ID: Gary [name], male   DOB: 05/06/1951, 70 y.o.   MRN: [id number]     Advanced Heart Failure Rounding Note  PCP-Cardiologist: Elouise Munroe, MD   Subjective:   Events  4/22  EGD 4/22 showed 2 ulcers in duodenum that were not actively bleeding.  On PO Protonix, Hgb 10>9.3>8.6  4/23 Intubated. Dobutamine stopped due to marked tachycardia. Started antibiotics for pneumonia. Blood CX- NGTD. Sputum with strep.  4/24 Did not tolerate fast veletri wean.  4/25 Veletri stopped. Extubated. Given 1u PRBC  5/1 Amio gtt stopped>>PO 400 mg daily  5/3 Right thoracentesis with 1200cc out (mostly transudative) 5/5 In A fib . High Res CT- redemonstrated spiculated mass anterior RML with trace left pleural effusion.  5/5 Started on pulse-dose steroids 5/9 RHC mild pulmonary htn, normal left sided filling pressures, high cardiac output.  5/10 Bubble Study Negative  5/12 Eliquis restarted.   Oxygen weaned down to 4 liters.   Feeling better. Denies SOB.    Objective:   Weight Range:   There is no height or weight on file to calculate BMI.   Vital Signs:   Temp:  [97.5 F (36.4 C)-98.5 F (36.9 C)] 98.5 F (36.9 C) (05/20 1338) Pulse Rate:  [67-72] 72 (05/20 1338) Resp:  [17-19] 17 (05/20 1338) BP: (92-110)/(47-71) 92/47 (05/20 1338) SpO2:  [93 %-100 %] 95 % (05/20 1338) FiO2 (%):  [100 %] 100 % (05/19 2220) Last BM Date: 10/04/20  Weight change: There were no vitals filed for this visit.  Intake/Output:   Intake/Output Summary (Last 24 hours) at 10/05/2020 1434 Last data filed at 10/05/2020 1331 Gross per 24 hour  Intake 640 ml  Output 1700 ml  Net -1060 ml      Physical Exam   General:  Sitting in the chair.  No resp difficulty HEENT: normal Neck: supple. no JVD. Carotids 2+ bilat; no bruits. No lymphadenopathy or thryomegaly appreciated. Cor: PMI nondisplaced. Regular rate & rhythm. No rubs, gallops or murmurs. Lungs: clear on 4 liters. On 4 liters.  Abdomen:  soft, nontender, nondistended. No hepatosplenomegaly. No bruits or masses. Good bowel sounds. Extremities: no cyanosis, clubbing, rash, edema.  Neuro: alert & orientedx3, cranial nerves grossly intact. moves all 4 extremities w/o difficulty. Affect pleasant   Labs    CBC Recent Labs    10/03/20 0538  WBC 11.0*  NEUTROABS 8.9*  HGB 9.6*  HCT 31.1*  MCV 89.6  PLT 572   Basic Metabolic Panel Recent Labs    10/03/20 0538  NA 133*  K 4.0  CL 94*  CO2 33*  GLUCOSE 85  BUN 24*  CREATININE 0.90  CALCIUM 7.8*  MG 1.9   Liver Function Tests Recent Labs    10/03/20 0538  AST 21  ALT 33  ALKPHOS 54  BILITOT 0.8  PROT 4.7*  ALBUMIN 2.1*   No results for input(s): LIPASE, AMYLASE in the last 72 hours. Cardiac Enzymes No results for input(s): CKTOTAL, CKMB, CKMBINDEX, TROPONINI in the last 72 hours.  BNP: BNP (last 3 results) Recent Labs    09/03/20 1433 09/20/20 0047  BNP 1,202.7* 360.6*    ProBNP (last 3 results) No results for input(s): PROBNP in the last 8760 hours.   D-Dimer No results for input(s): DDIMER in the last 72 hours. Hemoglobin A1C No results for input(s): HGBA1C in the last 72 hours. Fasting Lipid Panel No results for input(s): CHOL, HDL, LDLCALC, TRIG, CHOLHDL, LDLDIRECT in the last 72 hours. Thyroid Function Tests  No results for input(s): TSH, T4TOTAL, T3FREE, THYROIDAB in the last 72 hours.  Invalid input(s): FREET3  Other results:   Imaging    No results found.   Medications:     Scheduled Medications: . (feeding supplement) PROSource Plus  30 mL Oral BID BM  . amiodarone  200 mg Oral Daily  . apixaban  5 mg Oral BID  . arformoterol  15 mcg Nebulization BID  . aspirin EC  81 mg Oral Q breakfast  . atorvastatin  40 mg Oral QPM  . budesonide (PULMICORT) nebulizer solution  0.25 mg Nebulization BID  . diltiazem  180 mg Oral Daily  . empagliflozin  10 mg Oral Daily  . ferrous sulfate  325 mg Oral Q breakfast  . insulin  aspart  0-5 Units Subcutaneous QHS  . insulin aspart  0-9 Units Subcutaneous TID WC  . mouth rinse  15 mL Mouth Rinse q12n4p  . metFORMIN  1,000 mg Oral BID WC  . midodrine  10 mg Oral TID WC  . multivitamin with minerals  1 tablet Oral Daily  . pantoprazole  40 mg Oral Daily  . polyethylene glycol  17 g Oral Daily  . Ensure Max Protein  11 oz Oral BID  . revefenacin  175 mcg Nebulization Daily  . sildenafil  40 mg Oral TID  . sodium chloride  1 spray Each Nare BID  . torsemide  20 mg Oral Daily    Infusions:   PRN Medications: acetaminophen, alum & mag hydroxide-simeth, bisacodyl, diphenhydrAMINE, guaiFENesin-dextromethorphan, polyethylene glycol, prochlorperazine **OR** prochlorperazine **OR** prochlorperazine, sodium phosphate, traZODone    Assessment/Plan    1. Acute upper GI bleed with symptomatic anemia - hgb dropped to 6.7. Transfused.->  Eliquis held  - CT negative for RP bleed - EGD with 2 ulcers not acutely bleeding.  - On protonix PO 40 mg bid  - Off  AC for 2 weeks to allow GI healing. Eliquis restarted 5/18 - Biopsy back for H pylori. On treatment per GI - Eliquis restarted 5/12 -> Hgb stable at 9.6   2. PAH with cor pulmonale - CT chest 4/22: No PE or ILD - Echo LVEF 60% severe RV dilation and HK - Cath with moderate PAH. PA = 66/31 (46) PCW = 13 Fick = 4.1/2.1 PVR = 7.8 WU - Suspect this is WHO Group 3 PAH due to hypoxic lung disease/OHS/OSA - Will order PFTs (when better compensated) and will need outpatient sleep study - Serologies for completed. Note abnormal ANA, with borderline elevation in chromatin antibody, but all other serologies negative. ANCA pendingESR only mildly abnormal at 44 - repeat RHC 09/24/20 PA 47/23 (32) PCW 14 - Continue sildenafil 40 mg tid. On midodrine 10 mg three times a day for Bp support  - Volume status stable. Continue torsemide 20 mg daily.  - Continue jardiance 10 mg daily and torsemide 20 daily   3. Paroxysmal AF - new  onset. Unclear duration  - Was in NSR 2 weeks ago --> back in AF - Off AC with recent large GI bleed but restarted last week.  - Continue PO amio 400 mg daily . - Not candidate for DC-CV without AC.  - May be candidate for [facility] LAA closure if we can get him stable on [facility] for 1-2 months. Would avoid long-term amio use given underlying lung disease if at all possible.  - Rates improved on cardizem 120    4. CAD  -mostly non-obstructive - Continue atorvastatin 40 mg daily.  -  No ASA with AC - No s/s ischemia   5. R lung mass - High Res CT- redemonstrated spiculated mass anterior RML with trace left pleural effusion.   6. Shock - Suspect mixed hemorrhagic/cardiogenic (RV failure).   - Resolved  7. Acute hypoxemic respiratory failure with bilateral pleural effusions - Intubated pre-EGD on 4/22.   - Extubated--> on HFNC-- very slow wean. On 30 L + 40%  - Respiratory status remains tenuous. As noted before, degree of hypoxemia far out of proportion to HF or PAH. Continue with aggressive pulmonary toilet and mobilization. - With response to O2 supplementation shunt physiology less likely.  -s/p R thora with 1200 cc out. (mostly transudative)  - High Res CT- redemonstrated spiculated mass anterior RML with trace left pleural effusion. - RHC 5/9 w/ mild pulmonary htn, normal left sided filling pressures, high cardiac output. - ? PVOD but would suspect pulmonary pressures to be higher - Echo w/o evidence of peripheral shunting  - Will need PFTs as an outpatient  - Has improved gradually.  - Continue torsemide 20 daily to keep dry  CIR appreciated.    Length of Stay: 3  Amy Clegg, NP  10/05/2020, 2:34 PM  Advanced Heart Failure Team Pager [phone] (M-F; 7a - 5p)  Please contact [facility] for night-coverage after hours (5p -7a ) and weekends on [email or web address]   Patient seen and examined with the above-signed Advanced Practice Provider and/or Housestaff. I personally reviewed  laboratory data, imaging studies and relevant notes. I independently examined the patient and formulated the important aspects of the plan. I have edited the note to reflect any of my changes or salient points. I have personally discussed the plan with the patient and/or family.  Respiratory status much improved. AFL rates improved on cardizem. Tolerating Eliquis, hgb stable  General: Sitting in chair . No resp difficulty HEENT: normal Neck: supple. no JVD. Carotids 2+ bilat; no bruits. No lymphadenopathy or thryomegaly appreciated. Cor: PMI nondisplaced. Irregular rate & rhythm. No rubs, gallops or murmurs. Lungs: clear Abdomen: soft, nontender, nondistended. No hepatosplenomegaly. No bruits or masses. Good bowel sounds. Extremities: no cyanosis, clubbing, rash, edema Neuro: alert & orientedx3, cranial nerves grossly intact. moves all 4 extremities w/o difficulty. Affect pleasant  Respiratory status improved. AFL rate well controlled. Tolerating Eliquis.   HF team will see again next week. Please call [facility] over weekend with any issues.   Glori Bickers, MD  4:50 PM

## 2020-10-05 NOTE — Progress Notes (Signed)
PROGRESS NOTE   Subjective/Complaints: Had a better night with CPAP last night. Wore foam dressing on nose. Feels that activity tolerance is better  ROS: Patient denies fever, rash, sore throat, blurred vision, nausea, vomiting, diarrhea, cough,  chest pain, joint or back pain, headache, or mood change.    Objective:   No results found. Recent Labs    10/03/20 0538  WBC 11.0*  HGB 9.6*  HCT 31.1*  PLT 333   Recent Labs    10/03/20 0538  NA 133*  K 4.0  CL 94*  CO2 33*  GLUCOSE 85  BUN 24*  CREATININE 0.90  CALCIUM 7.8*    Intake/Output Summary (Last 24 hours) at 10/05/2020 1118 Last data filed at 10/05/2020 0854 Gross per 24 hour  Intake 680 ml  Output 2000 ml  Net -1320 ml        Physical Exam: Vital Signs Blood pressure (!) 110/58, pulse 69, temperature 98.2 F (36.8 C), resp. rate 18, SpO2 96 %.  Constitutional: No distress . Vital signs reviewed. HEENT: EOMI, oral membranes moist Neck: supple Cardiovascular: RRR without murmur. No JVD    Respiratory/Chest: CTA Bilaterally without wheezes or rales. Normal effort    GI/Abdomen: BS +, non-tender, non-distended Ext: no clubbing, cyanosis, or edema Psych: pleasant and cooperative Skin: scab on bridge of nose. Reddened area proximally less prominent Neuro: Pt is cognitively appropriate with normal insight, memory, and awareness. Cranial nerves 2-12 are intact. Remains sl HOH. Sensory exam is normal. Reflexes are 2+ in all 4's. Fine motor coordination is intact. No tremors. Motor function is grossly 4/5 UE and 3-4/5 LE prox to distal.--no changes.  Musculoskeletal: Full ROM, No pain with AROM or PROM in the neck, trunk, or extremities. Posture appropriate      Assessment/Plan: 1. Functional deficits which require 3+ hours per day of interdisciplinary therapy in a comprehensive inpatient rehab setting.  Physiatrist is providing close team supervision  and 24 hour management of active medical problems listed below.  Physiatrist and rehab team continue to assess barriers to discharge/monitor patient progress toward functional and medical goals  Care Tool:  Bathing    Body parts bathed by patient: Right arm,Left arm,Abdomen,Chest,Front perineal area,Buttocks,Right upper leg,Left upper leg,Right lower leg,Left lower leg,Face         Bathing assist Assist Level: Minimal Assistance - Patient > 75%     Upper Body Dressing/Undressing Upper body dressing   What is the patient wearing?: Pull over shirt    Upper body assist Assist Level: Set up assist    Lower Body Dressing/Undressing Lower body dressing      What is the patient wearing?: Pants     Lower body assist Assist for lower body dressing: Minimal Assistance - Patient > 75%     Toileting Toileting    Toileting assist Assist for toileting: Independent with assistive device Assistive Device Comment: Urinal   Transfers Chair/bed transfer  Transfers assist     Chair/bed transfer assist level: Supervision/Verbal cueing Chair/bed transfer assistive device: Programmer, multimedia   Ambulation assist      Assist level: Supervision/Verbal cueing Assistive device: Walker-rolling Max distance: 155 ft   Walk  10 feet activity   Assist     Assist level: Supervision/Verbal cueing Assistive device: Walker-rolling   Walk 50 feet activity   Assist Walk 50 feet with 2 turns activity did not occur: Safety/medical concerns (decreased activity tolerance, oxygen desaturation to 87%)  Assist level: Supervision/Verbal cueing Assistive device: Walker-rolling    Walk 150 feet activity   Assist Walk 150 feet activity did not occur: Safety/medical concerns  Assist level: Supervision/Verbal cueing Assistive device: Walker-rolling    Walk 10 feet on uneven surface  activity   Assist     Assist level: Supervision/Verbal cueing Assistive device:  Walker-rolling   Wheelchair     Assist   Type of Wheelchair: Manual    Wheelchair assist level: Supervision/Verbal cueing Max wheelchair distance: >200 ft    Wheelchair 50 feet with 2 turns activity    Assist        Assist Level: Supervision/Verbal cueing   Wheelchair 150 feet activity     Assist      Assist Level: Supervision/Verbal cueing   Blood pressure (!) 110/58, pulse 69, temperature 98.2 F (36.8 C), resp. rate 18, SpO2 96 %.  Medical Problem List and Plan: 1.Functional and mobility deficitssecondary to debility after respiratory failure and multiple associated complications -patient may shower -ELOS/Goals: 7-10 days, mod I to supervision with PT and OT  -Continue CIR therapies including PT, OT   2. Antithrombotics: -DVT/anticoagulation:Pharmaceutical:Other (comment)--Eliquis resumed 05/12 and question plans for [facility] LAA closure. -antiplatelet therapy: N/A 3. Pain Management:N/A 4. Mood:LCSW to follow for evaluation and support. -antipsychotic agents: N/a 5. Neuropsych: This patientiscapable of making decisions on hisown behalf. 6. Skin/Wound Care:Routine pressure relief measures. 7. Fluids/Electrolytes/Nutrition:encourage PO  -recheck labs monday 8. OSA/Hypoxic respiratory failure: Completed pulse dose steroids on 05/17.  --pulmonary hygiene with flutter valve. CPAP. Yupleri nebs/day. Pulmicort nebs bid.  -wean oxygen as possible  -did better with foam dressing on nose last night 9. PAH with cor pulmonale: Felt to be in setting of hypoxic lung disease/OHS/OSA -On sildenafil, torsemide, Jardiance and midodrine for BP support.  10. GIB due to gastritis/duodenal ulcers/short segment Barrett's: Now on once daily Protonix.  --Back on DOAC. Monitor for any signs of recurrent bleeding.  11. A fib: Monitor HR tid--controlled on amiodarone and now  back on Eliquis.  12. Hyponatremia: mild, stable at 133 13. AKI: Has resolved with normalization of SCr and will likely need to stay dry.   -BUN/Cr stable on recent labs 41. Hypomagnesemia: Improved with IV supplementation.   -1.9    -daily oral supplement  -recheck Monday 15. Spiculated RML mass: Follow up with PCCM for further work up after d/c. 16. T2DM:   Hgb A1c- 6.7. Continue, jardiance, metformin 1000 mg bid.   CBG (last 3)  Recent Labs    10/04/20 1614 10/04/20 2045 10/05/20 0634  GLUCAP 171* 113* 119*   -5/20 improved control   --Monitor BS ac/hs and use SSI for elevated BS.  17. Leucocytosis: Likely reactive due to steroids---11k 5/18 --Monitor for signs of infection/recovery.     LOS: 3 days A FACE TO [place] 10/05/2020, 11:18 AM

## 2020-10-05 NOTE — Progress Notes (Signed)
Physical Therapy Session Note  Patient Details  Name: Gary [name] MRN: [id number] Date of Birth: 11-25-1950  Today's Date: 10/05/2020 PT Individual Time:  -      Short Term Goals: Week 1:  PT Short Term Goal 1 (Week 1): STG=LTG due to short ELOS  Skilled Therapeutic Interventions/Progress Updates:     Patient in recliner in the room upon PT arrival. Patient alert and agreeable to PT session. Patient reported 2/10 B knee pain during session, RN made aware. PT provided repositioning, rest breaks, and distraction as pain interventions throughout session.   SPO2 >93% on 4 L/min at rest and with basic transfers, desaturated to mid 80's with quick recovery in sitting for NMR and gait training on 6 L/min. HR irregular/variable between 50's-130's, RN made aware.  Therapeutic Activity: Transfers: Patient performed sit to/from stand x10 and stand pivot x2 with close supervision without AD. Provided verbal cues for backing up to the chair and reaching back to control descent for safety, as patient tends to hurry to sit.  Gait Training:  Patient ambulated 95 feet using RW with supervision. Ambulated with decreased gait speed, decreased step length and height, increased B hip and knee flexion in stance, forward trunk lean, and downward head gaze. Provided verbal cues for erect posture, increased step length and gait speed, shoulder depression, and paced breahting.  Wheelchair Mobility:  Patient was transported in the w/c with total A throughout session for energy conservation and time management.  Neuromuscular Re-ed: Patient performed the following dynamic balance and gait activities: -standing marching x1 min, slow cadence for focus on breath support and balance with CGA-close supervision -forwards/backwards walking 5 ft 3x2 laps next to mat table for safety without AD with CGA for balance, cues for increased step length for increased balance challenge and gluteal activation with backwards  walking -side-stepping R/L x5 ft 2x2 laps next to mat table for safety without AD with CGA for balance, cues for keeping toes forward for increased glut med activation  Patient in recliner in the room at end of session with breaks locked, chair alarm set, and all needs within reach.    Therapy Documentation Precautions:  Precautions Precautions: Fall,Other (comment) Precaution Comments: Watch SpO2 Restrictions Weight Bearing Restrictions: No    Therapy/Group: Individual Therapy  Avyaan Summer L Shiv Shuey PT, DPT  10/05/2020, 5:38 AM

## 2020-10-05 NOTE — Progress Notes (Signed)
Inpatient Rehabilitation Care Coordinator Assessment and Plan Patient Details  Name: Gary [name] MRN: [id number] Date of Birth: Oct 08, 1950  Today's Date: 10/05/2020  Hospital Problems: Principal Problem:   Debility  Past Medical History:  Past Medical History:  Diagnosis Date  . Accidentally struck by tree 2013   with skull fx, lung contusion, left clavicle Fx, bilateral pelvic Fx.   . Diabetes mellitus without complication ([place])   . Epistaxis 2014   Secondary to a nasal abnormality, treated and resolved  . Hyperlipidemia due to type 2 diabetes mellitus ([place])   . Hypertension    Past Surgical History:  Past Surgical History:  Procedure Laterality Date  . BIOPSY  09/07/2020   Procedure: BIOPSY;  Surgeon: Jackquline Denmark, MD;  Location: [facility] ENDOSCOPY;  Service: Endoscopy;;  . ESOPHAGOGASTRODUODENOSCOPY N/A 09/07/2020   Procedure: ESOPHAGOGASTRODUODENOSCOPY (EGD);  Surgeon: Jackquline Denmark, MD;  Location: [facility] ENDOSCOPY;  Service: Endoscopy;  Laterality: N/A;  Bedside in ICU  . NOSE SURGERY  2014  . PELVIC FRACTURE SURGERY  2013  . RIGHT HEART CATH N/A 09/24/2020   Procedure: RIGHT HEART CATH;  Surgeon: Jolaine Artist, MD;  Location: [place] CV LAB;  Service: Cardiovascular;  Laterality: N/A;  . RIGHT/LEFT HEART CATH AND CORONARY ANGIOGRAPHY N/A 09/04/2020   Procedure: RIGHT/LEFT HEART CATH AND CORONARY ANGIOGRAPHY;  Surgeon: Jolaine Artist, MD;  Location: [place] CV LAB;  Service: Cardiovascular;  Laterality: N/A;   Social History:  reports that he has never smoked. He has never used smokeless tobacco. He reports that he does not drink alcohol and does not use drugs.  Family / Support Systems Marital Status: Married How Long?: 15 years Patient Roles: Spouse Spouse/Significant Other: Gary [name] Children: 1 adult dtr that lives in [place] (from a previous marriage). Other Supports: neighborPharmacologist Anticipated Caregiver: Wife and neighbor to  assist Ability/Limitations of Caregiver: Wife works at [employer]  as an Glass blower/designer Mon-Fri or Mon-Sat 7am-4pm Caregiver Availability: Intermittent Family Dynamics: Pt lives with his wife Gary.  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 Education: some college Read: Yes Write: Yes Employment Status: Retired Date Retired/Disabled/Unemployed: 10/18/2003 Age Retired: 89 Legal History/Current Legal Issues: Denies Guardian/Conservator: N/A   Abuse/Neglect Abuse/Neglect Assessment Can Be Completed: Yes Physical Abuse: Denies Verbal Abuse: Denies Sexual Abuse: Denies Exploitation of patient/patient's resources: Denies Self-Neglect: Denies  Emotional Status Pt's affect, behavior and adjustment status: Pt in good spirits at time of visit Recent Psychosocial Issues: Denies Psychiatric History: Denies Substance Abuse History: Admits that he quit smoking cigarettes in 2005; occassional beer  Patient / Family Perceptions, Expectations & Goals Pt/Family understanding of illness & functional limitations: Pt and family have a general understanding of pt care needs Premorbid pt/family roles/activities: Independent Anticipated changes in roles/activities/participation: Assistance with some ADLs/IADLs Pt/family expectations/goals: Regain independence adn get stronger  [employer]: None Premorbid Home Care/DME Agencies: None Transportation available at discharge: wife Resource referrals recommended: Neuropsychology  Discharge Planning Living Arrangements: Spouse/significant other Support Systems: Spouse/significant other,Friends/neighbors Type of Residence: Private residence Insurance Resources: [employer] (specify) (Cign[employer] Supplement; Medications- Octuplus (mail order)) Financial Resources: Comptroller (Comment) (pension/retirement) Financial Screen Referred: No Living Expenses: Mortgage Money Management: Patient,Spouse Does the patient have any problems obtaining your medications?: No Home Management: Pt wife manages all home care needs such as cleaning. Pt cooks all meals Patient/Family Preliminary Plans: No changes Care Coordinator Barriers  to Discharge: Decreased caregiver support,Lack of/limited family support Care Coordinator Anticipated Follow Up Needs: HH/OP Expected length of stay: 7-10 days  Clinical Impression SW met with pt in room to introduce self, explain role, and discuss discharge process. Pt is not a English as a second language teacher. Wife has HCPOA forms and will bring in. Ramped entrance at back door. Pt DME: cane, 3in1 BSC, walk-in shower. Pt currently on oxygen, and will need to determine if needed at discharge. Pt called his wife Gary while SW in room. SW introduce self, explained role, discussed discharge process, and informed on ELOS 7-10 days. Wife will come in for family edu on Tuesday 1pm-3pm. SW to follow-up with updates after team conference.   Briceida Rasberry A Natahlia Hoggard 10/05/2020, 2:26 PM

## 2020-10-05 NOTE — Progress Notes (Signed)
Occupational Therapy Session Note  Patient Details  Name: Gary [name] MRN: [id number] Date of Birth: 12-20-1950  Today's Date: 10/05/2020 OT Individual Time: 4128-7867 OT Individual Time Calculation (min): 75 min   Today's Date: 10/05/2020 OT Individual Time:  -      Short Term Goals: Week 1:  OT Short Term Goal 1 (Week 1): STG=LTG d/t ELOS  Skilled Therapeutic Interventions/Progress Updates:    Session 1:  Pt received in EOB ready for session. 6L O2 required via [place] for mobility and ADLs this morning with skilled monitoring of vitals. Pt with increased fatigue this date but implements breathing strategies and rest breaks as needed with min cuing  ADL:  BADL completed at [place] ambulatory level this date with A only to manage O2 cord. Pt with O2 sat dipping to mid 80s on 6L O2 req 30-60 sec to recover. Pt able ot implement gather strategies leared yesterday for clean/dirty clothing retrieval. Pt stands for grooming demo improved endurance.   Therapeutic activity Pt educated on 2 breathing exercises: heart/belly breath for improved diaphram function- expand belly on inhales/contract on exhales; ujjayi breathe- longer exhales to increase CO2 displacement and calm CNS.  Pt left at end of session in recliner with exit alarm on, call light in reach and all needs met   Session 2: Pt participated in rhythmic drumming group. Pain in shoulders from use at end of session. RN aware and ice provided. Focus of group on BUE coordination, strengthening, endurance, timing/control, activity tolerance, and social participation and engagement. Pt performs session from seated position for energy conservation. Skilled interventions included rest breaks as needed, cues for breathing techniques, grading movement up. Warm up performed prior to exercises and UB stretching completed at end of group with demo from OT. Pt able to select preferred song to share with group. Returned pt to room at end of session.  Exited session with pt seated in recliner, exit alarm on and call light in reach Therapy Documentation Precautions:  Precautions Precautions: Fall,Other (comment) Precaution Comments: Watch SpO2 Restrictions Weight Bearing Restrictions: No General:   Vital Signs: Therapy Vitals Temp: 98.2 F (36.8 C) Pulse Rate: 69 Resp: 18 BP: (!) 102/57 Patient Position (if appropriate): Lying Oxygen Therapy SpO2: 93 % O2 Device: CPAP O2 Flow Rate (L/min): 4 L/min Pain:   ADL: ADL Eating: Independent Where Assessed-Eating: Edge of bed Grooming: Independent Where Assessed-Grooming: Sitting at sink Upper Body Bathing: Setup Where Assessed-Upper Body Bathing: Shower Lower Body Bathing: Minimal assistance Where Assessed-Lower Body Bathing: Shower Upper Body Dressing: Setup Where Assessed-Upper Body Dressing: Chair Lower Body Dressing: Minimal assistance Where Assessed-Lower Body Dressing: Chair Toileting: Minimal assistance Where Assessed-Toileting: Glass blower/designer: Psychiatric nurse Method: Counselling psychologist: Energy manager: Environmental education officer Method: Heritage manager: Civil engineer, contracting with back Glass blower/designer   Exercises:   Other Treatments:     Therapy/Group: Individual Therapy and Group Therapy  Tonny Branch 10/05/2020, 7:00 AM

## 2020-10-06 LAB — GLUCOSE, CAPILLARY
Glucose-Capillary: 121 mg/dL — ABNORMAL HIGH (ref 70–99)
Glucose-Capillary: 209 mg/dL — ABNORMAL HIGH (ref 70–99)
Glucose-Capillary: 141 mg/dL — ABNORMAL HIGH (ref 70–99)
Glucose-Capillary: 156 mg/dL — ABNORMAL HIGH (ref 70–99)

## 2020-10-06 NOTE — Progress Notes (Signed)
Occupational Therapy Session Note  Patient Details  Name: Gary [name] MRN: [id number] Date of Birth: Jan 23, 1951  Today's Date: 10/06/2020 OT Individual Time: 5361-4431 OT Individual Time Calculation (min): 57 min   Short Term Goals: Week 1:  OT Short Term Goal 1 (Week 1): STG=LTG d/t ELOS     Skilled Therapeutic Interventions/Progress Updates:    Pt greeted in the recliner, requesting to shower, satting at 93% on 4L at rest. Bumped him up to 6L prior to mobility. Pt completed toileting (using standard toilet, B+B void), bathing (sitting in shower), and dressing (sit<stand from toilet using RW and also sitting in recliner) during session. All functional transfers completed at ambulatory level using RW with supervision assist, OT managing portable 02 tank. Worked on pt self monitoring his 02 sats for carryover during his daily routine post d/c. Sats 85% post shower, 85% post dressing on toilet, and 86% post donning Teds in recliner. Sats increased to 91-92% with min cues for diaphragmatic breathing given ~45-68 sec. Pt utilizing adaptive strategies and AE as needed during LB dressing with setup assistance. He remained in recliner at close of session, all needs within reach, in care of respiratory therapist and RN.   Therapy Documentation Precautions:  Precautions Precautions: Fall,Other (comment) Precaution Comments: Watch SpO2 Restrictions Weight Bearing Restrictions: No Vital Signs: Oxygen Therapy SpO2: 95 % O2 Device: Nasal Cannula O2 Flow Rate (L/min): 3 L/min Pain: no c/o pain during tx Pain Assessment Pain Scale: 0-10 Pain Score: 0-No pain ADL: ADL Eating: Independent Where Assessed-Eating: Edge of bed Grooming: Independent Where Assessed-Grooming: Sitting at sink Upper Body Bathing: Setup Where Assessed-Upper Body Bathing: Shower Lower Body Bathing: Minimal assistance Where Assessed-Lower Body Bathing: Shower Upper Body Dressing: Setup Where Assessed-Upper Body Dressing:  Chair Lower Body Dressing: Minimal assistance Where Assessed-Lower Body Dressing: Chair Toileting: Minimal assistance Where Assessed-Toileting: Glass blower/designer: Psychiatric nurse Method: Counselling psychologist: Energy manager: Environmental education officer Method: Heritage manager: Shower seat with back      Therapy/Group: Individual Therapy  Timya Trimmer A Saylor Sheckler 10/06/2020, 12:28 PM

## 2020-10-07 DIAGNOSIS — Z9981 Dependence on supplemental oxygen: Secondary | ICD-10-CM

## 2020-10-07 DIAGNOSIS — N179 Acute kidney failure, unspecified: Secondary | ICD-10-CM

## 2020-10-07 DIAGNOSIS — E46 Unspecified protein-calorie malnutrition: Secondary | ICD-10-CM

## 2020-10-07 DIAGNOSIS — E871 Hypo-osmolality and hyponatremia: Secondary | ICD-10-CM

## 2020-10-07 DIAGNOSIS — I4819 Other persistent atrial fibrillation: Secondary | ICD-10-CM

## 2020-10-07 DIAGNOSIS — D72828 Other elevated white blood cell count: Secondary | ICD-10-CM

## 2020-10-07 DIAGNOSIS — D72829 Elevated white blood cell count, unspecified: Secondary | ICD-10-CM

## 2020-10-07 DIAGNOSIS — E8809 Other disorders of plasma-protein metabolism, not elsewhere classified: Secondary | ICD-10-CM

## 2020-10-07 LAB — GLUCOSE, CAPILLARY
Glucose-Capillary: 145 mg/dL — ABNORMAL HIGH (ref 70–99)
Glucose-Capillary: 191 mg/dL — ABNORMAL HIGH (ref 70–99)
Glucose-Capillary: 161 mg/dL — ABNORMAL HIGH (ref 70–99)
Glucose-Capillary: 153 mg/dL — ABNORMAL HIGH (ref 70–99)

## 2020-10-07 NOTE — Progress Notes (Signed)
PROGRESS NOTE   Subjective/Complaints: Patient seen sitting up in his chair this morning.  He states he slept well overnight.  He states therapies are going well.  He is looking forward to therapies today.  He was seen by heart failure team, notes reviewed- no changes, follow-up this week  ROS: Denies CP, SOB, N/V/D  Objective:   No results found. No results for input(s): WBC, HGB, HCT, PLT in the last 72 hours. No results for input(s): NA, K, CL, CO2, GLUCOSE, BUN, CREATININE, CALCIUM in the last 72 hours.  Intake/Output Summary (Last 24 hours) at 10/07/2020 0927 Last data filed at 10/07/2020 0900 Gross per 24 hour  Intake 360 ml  Output 1890 ml  Net -1530 ml        Physical Exam: Vital Signs Blood pressure 99/65, pulse 72, temperature 98 F (36.7 C), resp. rate 18, SpO2 93 %. Constitutional: No distress . Vital signs reviewed. HENT: Normocephalic.  Atraumatic. Eyes: EOMI. No discharge. Cardiovascular: No JVD.  Irregularly irregular. Respiratory: Normal effort.  No stridor.  Bilateral clear to auscultation.  + [place]. GI: Non-distended.  BS +. Skin: Warm and dry.   Bridge of nose with dressing CDI Psych: Normal mood.  Normal behavior.  Good spirits. Musc: No edema in extremities.  No tenderness in extremities. Neuro: Alert Motor: Bilateral upper extremities: 5/5 proximal distal Bilateral lower extremities: Hip flexion, knee extension 4-4+/5, ankle dorsiflexion 4+/5  Assessment/Plan: 1. Functional deficits which require 3+ hours per day of interdisciplinary therapy in a comprehensive inpatient rehab setting.  Physiatrist is providing close team supervision and 24 hour management of active medical problems listed below.  Physiatrist and rehab team continue to assess barriers to discharge/monitor patient progress toward functional and medical goals  Care Tool:  Bathing    Body parts bathed by patient: Right arm,Left  arm,Abdomen,Chest,Front perineal area,Buttocks,Right upper leg,Left upper leg,Right lower leg,Left lower leg,Face         Bathing assist Assist Level: Set up assist     Upper Body Dressing/Undressing Upper body dressing   What is the patient wearing?: Pull over shirt    Upper body assist Assist Level: Independent with assistive device    Lower Body Dressing/Undressing Lower body dressing      What is the patient wearing?: Pants,Underwear/pull up     Lower body assist Assist for lower body dressing: Supervision/Verbal cueing     Toileting Toileting    Toileting assist Assist for toileting: Supervision/Verbal cueing Assistive Device Comment: Urinal   Transfers Chair/bed transfer  Transfers assist     Chair/bed transfer assist level: Supervision/Verbal cueing Chair/bed transfer assistive device: Programmer, multimedia   Ambulation assist      Assist level: Supervision/Verbal cueing Assistive device: Walker-rolling Max distance: 155 ft   Walk 10 feet activity   Assist     Assist level: Supervision/Verbal cueing Assistive device: Walker-rolling   Walk 50 feet activity   Assist Walk 50 feet with 2 turns activity did not occur: Safety/medical concerns (decreased activity tolerance, oxygen desaturation to 87%)  Assist level: Supervision/Verbal cueing Assistive device: Walker-rolling    Walk 150 feet activity   Assist Walk 150 feet activity did  not occur: Safety/medical concerns  Assist level: Supervision/Verbal cueing Assistive device: Walker-rolling    Walk 10 feet on uneven surface  activity   Assist     Assist level: Supervision/Verbal cueing Assistive device: Aeronautical engineer Will patient use wheelchair at discharge?: No (Per PT long term goals) Type of Wheelchair: Manual    Wheelchair assist level: Supervision/Verbal cueing Max wheelchair distance: >200 ft    Wheelchair 50 feet with 2 turns  activity    Assist        Assist Level: Supervision/Verbal cueing   Wheelchair 150 feet activity     Assist      Assist Level: Supervision/Verbal cueing   Blood pressure 99/65, pulse 72, temperature 98 F (36.7 C), resp. rate 18, SpO2 93 %.  Medical Problem List and Plan: 1.Functional and mobility deficitssecondary to debility after respiratory failure and multiple associated complications  Continue CIR 2. Antithrombotics: -DVT/anticoagulation:Pharmaceutical:Other (comment)--Eliquis resumed 05/12 and question plans for [facility] L[facility] closure. -antiplatelet therapy: N/A 3. Pain Management:N/A 4. Mood:LCSW to follow for evaluation and support. -antipsychotic agents: N/a 5. Neuropsych: This patientiscapable of making decisions on hisown behalf. 6. Skin/Wound Care:Routine pressure relief measures. 7. Fluids/Electrolytes/Nutrition:encourage PO 8. OSA/Hypoxic respiratory failure: Completed pulse dose steroids on 05/17.  --pulmonary hygiene with flutter valve. CPAP. [place] nebs/day. Pulmicort nebs bid.  -Wean supplemental oxygen as tolerated 9. PAH with cor pulmonale: Felt to be in setting of hypoxic lung disease/OHS/OSA -On sildenafil, torsemide, Jardiance and midodrine for BP support.  10. GIB due to gastritis/duodenal ulcers/short segment Barrett's: Now on once daily Protonix.  --Back on DOAC. Monitor for any signs of recurrent bleeding.  11. A fib: Monitor HR tid--controlled on amiodarone and now back on Eliquis.   Rate controlled on 5/22 12. Hyponatremia: mild, stable at 133 13. AKI: Has resolved with normalization of SCr and will likely need to stay dry.   BUN/Creatinine 24/0.94 on 5/18, labs ordered for tomorrow 14. Hypomagnesemia: Improved with IV supplementation.   -1.9    -daily oral supplement  -recheck tomorrow 15. Spiculated RML mass: Follow up with PCCM for further work up after  d/c. 16. T2DM:   Hgb A1c- 6.7. Continue, jardiance, metformin 1000 mg bid.   CBG (last 3)  Recent Labs    10/06/20 1658 10/06/20 2055 10/07/20 0557  GLUCAP 141* 156* 145*   Mildly elevated on 5/22, monitor for now   --Monitor BS ac/hs and use SSI for elevated BS.  17. Leucocytosis: Likely reactive due to steroids  WBCs 11.0 on 5/18, continue to monitor --Monitor for signs of infection/recovery.  18.  Hyponatremia  Sodium 133 on 5/18, stable, labs ordered for tomorrow 19.  Hypoalbuminemia  Supplement initiated  LOS: 5 days A FACE TO FACE EVALUATION WAS PERFORMED  Medora Roorda Lorie Phenix 10/07/2020, 9:27 AM

## 2020-10-07 NOTE — Progress Notes (Signed)
Occupational Therapy Session Note  Patient Details  Name: Gary [name] MRN: [id number] Date of Birth: 08-Aug-1950  Today's Date: 10/07/2020 OT Individual Time: 6440-3474 OT Individual Time Calculation (min): 60 min   Short Term Goals: Week 1:  OT Short Term Goal 1 (Week 1): STG=LTG d/t ELOS  Skilled Therapeutic Interventions/Progress Updates:    Pt greeted in the recliner, affect bright at the sight of therapist. Requesting to shower this AM, satting at 97% on 4L. Pt completed toileting (using elevated toilet, +bladder void), bathing (sitting on shower seat, door ajar for improved ventilation), dressing (sit<stand using RW from elevated toilet and also sitting in recliner), and grooming tasks (standing at sink) during session. All functional transfers completed at ambulatory level using RW with supervision assist. Pt gathered clothing items before engaging in his morning routine with 1 cue to initiate. Like during our session yesterday, worked on pt self monitoring his 02 sats during routine and also building in rest breaks/slowing pace for improved respiratory capacities. 02 sats post shower 86% rebounding to 92% in ~30 sec, post dressing in bathroom 86% rebounding to 91% in ~16 sec, post dressing sitting in recliner 86% rebounding to 92% in ~25 sec. Min cues for using adaptive bag method to don Ted hose by himself. At end of session pt remained sitting up in the recliner, hooked back up to room 02 on 4L at rest, all needs within reach. Tx focus placed on ADL retraining, functional transfers, standing balance, pt education, and maintaining stable vitals during functional activity.   Therapy Documentation Precautions:  Precautions Precautions: Fall,Other (comment) Precaution Comments: Watch SpO2 Restrictions Weight Bearing Restrictions: No Vital Signs: Oxygen Therapy SpO2: 97 % O2 Device: Nasal Cannula O2 Flow Rate (L/min): 4 L/min Pain:in the Rt knee, pt premedicated with tylenol    ADL: ADL Eating: Independent Where Assessed-Eating: Edge of bed Grooming: Independent Where Assessed-Grooming: Sitting at sink Upper Body Bathing: Setup Where Assessed-Upper Body Bathing: Shower Lower Body Bathing: Minimal assistance Where Assessed-Lower Body Bathing: Shower Upper Body Dressing: Setup Where Assessed-Upper Body Dressing: Chair Lower Body Dressing: Minimal assistance Where Assessed-Lower Body Dressing: Chair Toileting: Minimal assistance Where Assessed-Toileting: Glass blower/designer: Psychiatric nurse Method: Counselling psychologist: Energy manager: Environmental education officer Method: Heritage manager: Shower seat with back      Therapy/Group: Individual Therapy  Skeet Simmer 10/07/2020, 12:34 PM

## 2020-10-07 NOTE — Progress Notes (Signed)
Physical Therapy Session Note  Patient Details  Name: Gary [name] MRN: [id number] Date of Birth: 1950-11-28  Today's Date: 10/07/2020 PT Individual Time: 1105-1202 and 1400-1500 PT Individual Time Calculation (min): 57 min and 60 min   Short Term Goals: Week 1:  PT Short Term Goal 1 (Week 1): STG=LTG due to short ELOS  Skilled Therapeutic Interventions/Progress Updates:     Session 1: Patient in recliner in the room on 4 L/min O2 upon PT arrival. Patient alert and agreeable to PT session. Patient reported intermittent 2-3/10 R knee pain during session, RN made aware. PT provided repositioning, rest breaks, and distraction as pain interventions throughout session.   Vitals: Increased patient to 6 L/min with activity during session. SPO2 dropped to mid 80's with activity, recovered to >92% <90 sec with pursed lip breathing. HR elevated to 143 bpm after 3 steps on first trial, recovered to <100 bpm <1 min, otherwise 65-100 bpm throughout session.  Therapeutic Activity: Transfers: Patient performed sit to/from stand x7 with supervision-mod I with use of RW. Provided verbal cues for completing a turn and reaching back to sit for safety x2. Patient was continent of bladder using a urinal with mod I during session.  Gait Training:  Patient ambulated 15 feet then ascended/descended 1-5" step using RW with supervision to simulate porch entry to home. Performed step-to gait pattern leading with L while ascending and R while desending. Provided demonstration andcues for technique and sequencing.  Patient ascending/descended 3-6" steps using RW with supervision to simulate car port entry to home x2. Performed step-to gait pattern leading with L while ascending backwards and R while descending forwards. Provided demonstration and cues for technique and sequencing.   Therapeutic Exercise: Patient performed the following exercises with verbal and tactile cues for proper technique. Patient ambulated 127  ft and 176 ft feet using RW with supervision for increased endurance with a functional activity. Ambulated with decreased gait speed, decreased step length and height, forward trunk lean, and increased shoulder elevation.  Provided verbal cues for shoulder depression, increased foot clearance, and paced breathing.  Patient required increased time and rest breaks with mobility due to decreased activity tolerance, discussed home set-up, energy conservation techniques at home and in the community, as patient states he needs to be able to do the grocery shopping, and d/c planning during rest breaks.  Patient in recliner in the room on 4 L/min O2 at end of session with breaks locked and all needs within reach.   Session 2: Patient in recliner in the room with his wife present upon PT arrival. Patient alert and agreeable to PT session. Patient denied pain during session. Patient's wife participated in family education throughout session.   Vitals: Increased patient to 6 L/min with activity during session. SPO2 dropped to mid 80's with activity, recovered to >92% <90 sec with pursed lip breathing. HR elevated to 140 bpm after ambulating on unlevel surfaces, recovered to <100 bpm <1 min, otherwise 65-100 bpm throughout session.  Educated patient and his wife on recommendations for use of RW with all mobility at d/c, challenges with O2 line management and fall risk at home, energy conservation strategies, fall risk/prevention, home modifications to prevent falls, and activation of emergency services in the event of a fall during session. Patient's wife observed all mobility with PT provided cues for safe guarding technique throughout, with plans to have his wife perform all mobility with patient during education scheduled on Tuesday. Discussed d/c planning, home set-up, and PT goals throughout  session.   Provided shorter RW during session to reduce shoulder elevation, RW was too low for patient causing  increased forward trunk flexion, returned to original RW with cues for shoulder depression during session.   Therapeutic Activity: Transfers: Patient performed sit to/from stand x6 with mod I with use of RW.  Gait Training:  Patient ambulated up/down a ramp, over 10 feet of mulch (unlevel surface), and up/down a curb to simulate community ambulation over unlevel surfaces using RW with close supervision and total A for O2 management . Provided cues for technique and use of AD. Patient ascending/descended 3-6" steps using RW with supervision to simulate car port entry to home. Performed step-to gait pattern leading with L while ascending backwards and R while descending forwards. Provided demonstration and cues for technique and sequencing.   Therapeutic Exercise: Patient performed the following exercises with verbal and tactile cues for proper technique. Patient ambulated 175 ft feet using RW with supervision for increased endurance with a functional activity. Ambulated with decreased gait speed, decreased step length and height, forward trunk lean, and increased shoulder elevation.  Provided verbal cues for shoulder depression, increased foot clearance, and paced breathing.  Patient required increased time and rest breaks with mobility due to decreased activity tolerance.  Patient in recliner in the room on 4 L/min O2 at end of session with breaks locked and all needs within reach.     Therapy Documentation Precautions:  Precautions Precautions: Fall,Other (comment) Precaution Comments: Watch SpO2 Restrictions Weight Bearing Restrictions: No   Therapy/Group: Individual Therapy  Iris Hairston L Vira Chaplin PT, DPT  10/07/2020, 12:05 PM

## 2020-10-08 LAB — BASIC METABOLIC PANEL
Anion gap: 8 (ref 5–15)
BUN: 19 mg/dL (ref 8–23)
CO2: 28 mmol/L (ref 22–32)
Calcium: 7.9 mg/dL — ABNORMAL LOW (ref 8.9–10.3)
Chloride: 98 mmol/L (ref 98–111)
Creatinine, Ser: 0.96 mg/dL (ref 0.61–1.24)
GFR, Estimated: >60 mL/min (ref >60)
Glucose, Bld: 159 mg/dL — ABNORMAL HIGH (ref 70–99)
Potassium: 3.7 mmol/L (ref 3.5–5.1)
Sodium: 134 mmol/L — ABNORMAL LOW (ref 135–145)

## 2020-10-08 LAB — GLUCOSE, CAPILLARY
Glucose-Capillary: 147 mg/dL — ABNORMAL HIGH (ref 70–99)
Glucose-Capillary: 185 mg/dL — ABNORMAL HIGH (ref 70–99)
Glucose-Capillary: 124 mg/dL — ABNORMAL HIGH (ref 70–99)
Glucose-Capillary: 150 mg/dL — ABNORMAL HIGH (ref 70–99)

## 2020-10-08 LAB — CBC
HCT: 30.3 % — ABNORMAL LOW (ref 39.0–52.0)
Hemoglobin: 9.2 g/dL — ABNORMAL LOW (ref 13.0–17.0)
MCH: 27.3 pg (ref 26.0–34.0)
MCHC: 30.4 g/dL (ref 30.0–36.0)
MCV: 89.9 fL (ref 80.0–100.0)
Platelets: 210 10*3/uL (ref 150–400)
RBC: 3.37 MIL/uL — ABNORMAL LOW (ref 4.22–5.81)
RDW: 16.6 % — ABNORMAL HIGH (ref 11.5–15.5)
WBC: 8.4 10*3/uL (ref 4.0–10.5)
nRBC: 0 % (ref 0.0–0.2)

## 2020-10-08 LAB — MAGNESIUM: Magnesium: 1.5 mg/dL — ABNORMAL LOW (ref 1.7–2.4)

## 2020-10-08 MED ORDER — MAGNESIUM GLUCONATE 500 MG PO TABS
500.0000 mg | ORAL_TABLET | Freq: Every day | ORAL | Status: DC
  Administered 2020-10-09: 500 mg via ORAL
  Filled 2020-10-08: qty 1

## 2020-10-08 MED ORDER — MAGNESIUM SULFATE 2 GM/50ML IV SOLN
2.0000 g | Freq: Once | INTRAVENOUS | Status: AC
  Administered 2020-10-08: 2 g via INTRAVENOUS
  Filled 2020-10-08: qty 50

## 2020-10-08 NOTE — Progress Notes (Signed)
PROGRESS NOTE   Subjective/Complaints: Pt had a good weekend. Moving better. Happy with progress. Was able to get down to 4L Oxygen at times  ROS: Patient denies fever, rash, sore throat, blurred vision, nausea, vomiting, diarrhea, cough, shortness of breath or chest pain, joint or back pain, headache, or mood change.    Objective:   No results found. Recent Labs    10/08/20 0640  WBC 8.4  HGB 9.2*  HCT 30.3*  PLT 210   Recent Labs    10/08/20 0640  NA 134*  K 3.7  CL 98  CO2 28  GLUCOSE 159*  BUN 19  CREATININE 0.96  CALCIUM 7.9*    Intake/Output Summary (Last 24 hours) at 10/08/2020 1039 Last data filed at 10/08/2020 0700 Gross per 24 hour  Intake 560 ml  Output 2050 ml  Net -1490 ml        Physical Exam: Vital Signs Blood pressure (!) 103/53, pulse 75, temperature 98.4 F (36.9 C), resp. rate 18, SpO2 95 %. Constitutional: No distress . Vital signs reviewed. HEENT: EOMI, oral membranes moist Neck: supple Cardiovascular: RRR without murmur. No JVD    Respiratory/Chest: CTA Bilaterally without wheezes or rales. Normal effort. O2 5L GI/Abdomen: BS +, non-tender, non-distended Ext: no clubbing, cyanosis, or edema Psych: pleasant and cooperative Skin: Warm and dry.   Bridge of nose with scabs--stable Musc: No edema in extremities.  No tenderness in extremities. Neuro: Alert Motor: Bilateral upper extremities: 5/5 proximal distal Bilateral lower extremities: Hip flexion, knee extension 4-4+/5, ankle dorsiflexion 4+/5  Assessment/Plan: 1. Functional deficits which require 3+ hours per day of interdisciplinary therapy in a comprehensive inpatient rehab setting.  Physiatrist is providing close team supervision and 24 hour management of active medical problems listed below.  Physiatrist and rehab team continue to assess barriers to discharge/monitor patient progress toward functional and medical  goals  Care Tool:  Bathing    Body parts bathed by patient: Right arm,Left arm,Abdomen,Chest,Front perineal area,Buttocks,Right upper leg,Left upper leg,Right lower leg,Left lower leg,Face         Bathing assist Assist Level: Set up assist     Upper Body Dressing/Undressing Upper body dressing   What is the patient wearing?: Pull over shirt    Upper body assist Assist Level: Independent with assistive device    Lower Body Dressing/Undressing Lower body dressing      What is the patient wearing?: Pants,Underwear/pull up     Lower body assist Assist for lower body dressing: Supervision/Verbal cueing     Toileting Toileting    Toileting assist Assist for toileting: Supervision/Verbal cueing Assistive Device Comment: Urinal   Transfers Chair/bed transfer  Transfers assist     Chair/bed transfer assist level: Independent with assistive device Chair/bed transfer assistive device: Museum/gallery exhibitions officer assist      Assist level: Supervision/Verbal cueing Assistive device: Walker-rolling Max distance: 176 ft   Walk 10 feet activity   Assist     Assist level: Supervision/Verbal cueing Assistive device: Walker-rolling   Walk 50 feet activity   Assist Walk 50 feet with 2 turns activity did not occur: Safety/medical concerns (decreased activity tolerance, oxygen desaturation to  87%)  Assist level: Supervision/Verbal cueing Assistive device: Walker-rolling    Walk 150 feet activity   Assist Walk 150 feet activity did not occur: Safety/medical concerns  Assist level: Supervision/Verbal cueing Assistive device: Walker-rolling    Walk 10 feet on uneven surface  activity   Assist     Assist level: Supervision/Verbal cueing Assistive device: Walker-rolling   Wheelchair     Assist Will patient use wheelchair at discharge?: No (Per PT long term goals) Type of Wheelchair: Manual    Wheelchair assist level:  Supervision/Verbal cueing Max wheelchair distance: >200 ft    Wheelchair 50 feet with 2 turns activity    Assist        Assist Level: Supervision/Verbal cueing   Wheelchair 150 feet activity     Assist      Assist Level: Supervision/Verbal cueing   Blood pressure (!) 103/53, pulse 75, temperature 98.4 F (36.9 C), resp. rate 18, SpO2 95 %.  Medical Problem List and Plan: 1.Functional and mobility deficitssecondary to debility after respiratory failure and multiple associated complications  -Continue CIR therapies including PT, OT  2. Antithrombotics: -DVT/anticoagulation:Pharmaceutical:Other (comment)--Eliquis resumed 05/12 and question plans for [facility] LAA closure. -antiplatelet therapy: N/A 3. Pain Management:N/A 4. Mood:LCSW to follow for evaluation and support. -antipsychotic agents: N/a 5. Neuropsych: This patientiscapable of making decisions on hisown behalf. 6. Skin/Wound Care:Routine pressure relief measures. 7. Fluids/Electrolytes/Nutrition:encourage PO 8. OSA/Hypoxic respiratory failure: Completed pulse dose steroids on 05/17.  --pulmonary hygiene with flutter valve. CPAP. Yupleri nebs/day. Pulmicort nebs bid.  -Wean supplemental oxygen as tolerated--has been as low as 4L 9. PAH with cor pulmonale: Felt to be in setting of hypoxic lung disease/OHS/OSA -On sildenafil, torsemide, Jardiance and midodrine for BP support.  10. GIB due to gastritis/duodenal ulcers/short segment Barrett's: Now on once daily Protonix.  --Back on DOAC. Monitor for any signs of recurrent bleeding.  11. A fib: Monitor HR tid--controlled on amiodarone and now back on Eliquis.   Rate controlled on 5/23 12. Hyponatremia: mild, stable at 133 13. AKI: Has resolved with normalization of SCr and will likely need to stay dry.   BUN/Creatinine 24/0.94 on 5/18--> 19/0.96 14. Hypomagnesemia: Improved with IV  supplementation.   -1.5 again today 5/23-- 15. Spiculated RML mass: Follow up with PCCM for further work up after d/c. 16. T2DM:   Hgb A1c- 6.7. Continue, jardiance, metformin 1000 mg bid.   CBG (last 3)  Recent Labs    10/07/20 1647 10/07/20 2105 10/08/20 0547  GLUCAP 161* 153* 147*   Mildly elevated on 5/22, monitor for now   --Monitor BS ac/hs and use SSI for elevated BS.  17. Leucocytosis: Likely reactive due to steroids  WBCs 11.0 on 5/18, continue to monitor --Monitor for signs of infection/recovery.  18.  Hyponatremia  Sodium 133 on 5/18, stable, 134 5/23 19.  Hypoalbuminemia  Supplement initiated  LOS: 6 days A FACE TO FACE EVALUATION WAS PERFORMED  [name] 10/08/2020, 10:39 AM

## 2020-10-08 NOTE — Progress Notes (Signed)
Patient ID: Gary [name], male   DOB: 1951/02/05, 70 y.o.   MRN: [id number]  SW received updates from pt wife informing she has DME: RW, 3in1 BSC and shower chair and did not need. She wanted to know if she was required to come in for family edu tomorrow since she will also have to be here to pick him up on date of discharge as well. SW informed will check with therapist.   SW received updates from therapy, that additional therapies were not needed. SW informed pt wife [name] she did not need to come in. SW to follow-up after team conference.   Loralee Pacas, MSW, [place] Office: [phone] Cell: [phone] Fax: [phone]

## 2020-10-08 NOTE — Progress Notes (Signed)
Physical Therapy Session Note  Patient Details  Name: Gary [name] MRN: [id number] Date of Birth: 05-14-1951  Today's Date: 10/08/2020 PT Individual Time: [phone] and [id number] PT Individual Time Calculation (min): 70 min and 10 min  Short Term Goals: Week 1:  PT Short Term Goal 1 (Week 1): STG=LTG due to short ELOS  Skilled Therapeutic Interventions/Progress Updates:     Session 1: Patient in recliner in the room with RT performing breathing treatment upon PT arrival. Patient alert and agreeable to PT session. Patient reported 3/10 R quad soreness during session, RN made aware. PT provided repositioning, rest breaks, and distraction as pain interventions throughout session.   Vitals: Increased patient to 6 L/min with activity during session. SPO2 dropped to mid 80's with activity, recovered to >90% <90 sec with pursed lip breathing. HR between 65-113 bpm throughout session.  Therapeutic Activity: Transfers: Patient performed sit to/from stand x6 with and without RW and mod I. Provided verbal cues for forward weight shift without AD.  Gait Training:  6 Min Walk Test:  Instructed patient to ambulate as quickling and as safely as possible for 6 minutes using LRAD. Patient was allowed to take standing rest breaks without stopping the test, but if he required a sitting rest break the clock would be stopped and the test would be over.  Results: 373 feet (113.7 meters. avg speed 0.32 m/s) using a RW on 6L/min O2. SPO2 85% and RPE 8/10 at end of test. Patient recovered to 91% in <1 min with pursed lip breathing on 6L/min. Performed 2 standing rest breaks <10 sec each.    Patient ambulated a total of 386 ft before resting in sitting. Provided cues for pursed lip breathing, shoulder depression, and increased foot clearance throughout.   Neuromuscular Re-ed: Patient performed the Berg Balance Scale: Patient demonstrates increased fall risk as noted by score of 46/56 on Berg Balance Scale.  (<36=  high risk for falls, close to 100%; 37-45 significant >80%; 46-51 moderate >50%; 52-55 lower >25%) Educated patient on results and interpretation, recommended use of RW with all mobility due to moderate fall risk score, patient in agreement.  Therapeutic Exercise: Patient performed the following exercises during breathing treatment with verbal and tactile cues for proper technique. -seated marching x1 min -diaphragmatic breathing with manual resistance at diaphragm x1 min, patient with poor motor control initially, improved with repetition -LAQ B x5  Patient required increased time and rest breaks with all mobility. Provided education on progression of mobility with OPPT, management of fatigue with increased activity or lack of sleep, and provided and reviewed [place] exercise B HEP handout for mini squats, sit to stands, tandem stance, single leg stance, backwards walking, and side stepping (removed stairs and figure 8 walking due to lack of steps and O2 line management).  Patient in recliner in the room at end of session with breaks locked and all needs within reach.   Session 2: Patient in recliner in the room upon PT arrival. Patient anxious and frustrated about new IV placement. Discussed with RN who reported patient with very low blood value of magnesium with need for IV magnesium administration. Educated patient on signs and symptoms of low magnesium, focus on fatigue and muscle weakness, as patient reported those during am session. Discussed good nutritional sources of magnesium from [facility] website and long term consequences to cardiovascular and bone health if magnesium remains low. Patient receptive to education. Patient received a call from his wife and requested time to discuss this with  her. Patient missed 20 min of skilled PT due to fatigue/anxiety/phone call, RN made aware. Will attempt to make-up missed time as able.  Patient in recliner in the room at end of session with breaks locked and  all needs within reach.    Therapy Documentation Precautions:  Precautions Precautions: Fall,Other (comment) Precaution Comments: Watch SpO2 Restrictions Weight Bearing Restrictions: No General:   Vital Signs: Oxygen Therapy SpO2: 95 % O2 Device: Nasal Cannula O2 Flow Rate (L/min): 4 L/min Balance: Balance Balance Assessed: Yes Standardized Balance Assessment Standardized Balance Assessment: Berg Balance Test Berg Balance Test Sit to Stand: Able to stand  independently using hands Standing Unsupported: Able to stand safely 2 minutes Sitting with Back Unsupported but Feet Supported on Floor or Stool: Able to sit safely and securely 2 minutes Stand to Sit: Controls descent by using hands Transfers: Able to transfer safely, definite need of hands Standing Unsupported with Eyes Closed: Able to stand 10 seconds safely Standing Ubsupported with Feet Together: Able to place feet together independently and stand 1 minute safely From Standing, Reach Forward with Outstretched Arm: Can reach forward >12 cm safely (5") From Standing Position, Pick up Object from Floor: Able to pick up shoe safely and easily From Standing Position, Turn to Look Behind Over each Shoulder: Looks behind from both sides and weight shifts well Turn 360 Degrees: Able to turn 360 degrees safely in 4 seconds or less Standing Unsupported, Alternately Place Feet on Step/Stool: Able to complete >2 steps/needs minimal assist Standing Unsupported, One Foot in Front: Able to place foot tandem independently and hold 30 seconds Standing on One Leg: Tries to lift leg/unable to hold 3 seconds but remains standing independently Total Score: 46/56   Therapy/Group: Individual Therapy  Bret Stamour L Betti Goodenow PT, DPT  10/08/2020, 12:38 PM

## 2020-10-08 NOTE — Progress Notes (Signed)
Occupational Therapy Session Note  Patient Details  Name: Gary [name] MRN: [id number] Date of Birth: [birth year]  Today's Date: 10/08/2020 OT Individual Time: 7494-4967 OT Individual Time Calculation (min): 55 min   Session 2: OT Individual Time: 1300-`1330 OT Individual Time Calculation (min): 30 min    Short Term Goals: Week 1:  OT Short Term Goal 1 (Week 1): STG=LTG d/t ELOS  Skilled Therapeutic Interventions/Progress Updates:    Session 1 Pt received sitting in recliner with 2/10 pain in his R knee, reporting this is chronic and shower requested as intervention. Pt on 5L O2 at rest with SpO2 at 90%. His O2 was titrated to 6L for functional mobility from recliner to bathroom, approx 10 feet. He had a desaturation to 78% following and required a full 90 sec to rebound to > 90 %. Pt with good carryover of energy conservation strategies but edu was reinforced throughout session. He required for rest breaks throughout shower. One desaturation in shower following hair washing to 80% with rebound in 60 sec with deep breathing. Pt completed all bathing at supervision level. Pt dressed from toilet with supervision-mod I overall. Increased time required throughout session for Spo2 monitoring and rest breaks needed for O2 rebound. Pt returned to his recliner, using RW for 10 ft of functional mobility at supervision level. Similar recovery time needed. Teds donned by OT d/t time constraints. Pt was left sitting up with all needs met- on 5L O2.   Session 2: Pt received in recliner with no c/o pain, agreeable to OT session. Pt completed 100 ft of functional mobility with supervision overall, on 6L O2. Following mobility pt Spo2 at 77% and rebounded to 93% within 3 minutes. Pt completed blocked practice sit <>stands with no UE reliance, x3 repetitions with min A during final 2 trials d/t fatigue. Pt required extended rest break before completing second trial with similar performance. Desaturation to 79% on  6L O2, 40mnute to rebound to >90%. Pt returned to his room and was left sitting in the recliner with all needs met.    Therapy Documentation Precautions:  Precautions Precautions: Fall,Other (comment) Precaution Comments: Watch SpO2 Restrictions Weight Bearing Restrictions: No   Therapy/Group: Individual Therapy  SCurtis Sites5/23/2022, 6:24 AM

## 2020-10-09 ENCOUNTER — Other Ambulatory Visit (HOSPITAL_COMMUNITY): Payer: Self-pay

## 2020-10-09 DIAGNOSIS — J9610 Chronic respiratory failure, unspecified whether with hypoxia or hypercapnia: Secondary | ICD-10-CM

## 2020-10-09 DIAGNOSIS — J449 Chronic obstructive pulmonary disease, unspecified: Secondary | ICD-10-CM

## 2020-10-09 LAB — MAGNESIUM: Magnesium: 1.7 mg/dL (ref 1.7–2.4)

## 2020-10-09 LAB — GLUCOSE, CAPILLARY
Glucose-Capillary: 131 mg/dL — ABNORMAL HIGH (ref 70–99)
Glucose-Capillary: 151 mg/dL — ABNORMAL HIGH (ref 70–99)
Glucose-Capillary: 170 mg/dL — ABNORMAL HIGH (ref 70–99)
Glucose-Capillary: 156 mg/dL — ABNORMAL HIGH (ref 70–99)

## 2020-10-09 MED ORDER — ATORVASTATIN CALCIUM 40 MG PO TABS
40.0000 mg | ORAL_TABLET | Freq: Every evening | ORAL | 0 refills | Status: AC
  Filled 2020-10-09: qty 30, 30d supply, fill #0

## 2020-10-09 MED ORDER — APIXABAN 5 MG PO TABS
5.0000 mg | ORAL_TABLET | Freq: Two times a day (BID) | ORAL | 0 refills | Status: DC
  Filled 2020-10-09: qty 60, 30d supply, fill #0

## 2020-10-09 MED ORDER — SILDENAFIL CITRATE 20 MG PO TABS
40.0000 mg | ORAL_TABLET | Freq: Three times a day (TID) | ORAL | 0 refills | Status: DC
  Filled 2020-10-09: qty 180, 30d supply, fill #0

## 2020-10-09 MED ORDER — POTASSIUM CHLORIDE ER 10 MEQ PO TBCR
20.0000 meq | EXTENDED_RELEASE_TABLET | Freq: Every day | ORAL | Status: DC
  Administered 2020-10-09 – 2020-10-10 (×2): 20 meq via ORAL
  Filled 2020-10-09 (×3): qty 2

## 2020-10-09 MED ORDER — DILTIAZEM HCL ER COATED BEADS 180 MG PO CP24
180.0000 mg | ORAL_CAPSULE | Freq: Every day | ORAL | 0 refills | Status: DC
  Filled 2020-10-09: qty 30, 30d supply, fill #0

## 2020-10-09 MED ORDER — POTASSIUM CHLORIDE CRYS ER 20 MEQ PO TBCR
20.0000 meq | EXTENDED_RELEASE_TABLET | Freq: Every day | ORAL | 0 refills | Status: AC
  Filled 2020-10-09: qty 30, 30d supply, fill #0

## 2020-10-09 MED ORDER — METFORMIN HCL 500 MG PO TABS
1000.0000 mg | ORAL_TABLET | Freq: Two times a day (BID) | ORAL | 0 refills | Status: AC
  Filled 2020-10-09: qty 120, 30d supply, fill #0

## 2020-10-09 MED ORDER — MAGNESIUM GLUCONATE 500 MG PO TABS
500.0000 mg | ORAL_TABLET | Freq: Two times a day (BID) | ORAL | 0 refills | Status: AC
  Filled 2020-10-09: qty 60, 30d supply, fill #0

## 2020-10-09 MED ORDER — MAGNESIUM GLUCONATE 500 MG PO TABS
500.0000 mg | ORAL_TABLET | Freq: Two times a day (BID) | ORAL | Status: DC
  Administered 2020-10-09 – 2020-10-10 (×2): 500 mg via ORAL
  Filled 2020-10-09 (×3): qty 1

## 2020-10-09 MED ORDER — PANTOPRAZOLE SODIUM 40 MG PO TBEC
40.0000 mg | DELAYED_RELEASE_TABLET | Freq: Every day | ORAL | 0 refills | Status: DC
  Filled 2020-10-09: qty 30, 30d supply, fill #0

## 2020-10-09 MED ORDER — FERROUS SULFATE 325 (65 FE) MG PO TABS
325.0000 mg | ORAL_TABLET | Freq: Every day | ORAL | 0 refills | Status: DC
  Filled 2020-10-09: qty 30, 30d supply, fill #0

## 2020-10-09 MED ORDER — EMPAGLIFLOZIN 10 MG PO TABS
10.0000 mg | ORAL_TABLET | Freq: Every day | ORAL | 0 refills | Status: AC
  Filled 2020-10-09: qty 14, 14d supply, fill #0
  Filled 2020-10-30: qty 14, 14d supply, fill #1

## 2020-10-09 MED ORDER — TORSEMIDE 20 MG PO TABS
20.0000 mg | ORAL_TABLET | Freq: Every day | ORAL | 0 refills | Status: DC
  Filled 2020-10-09: qty 30, 30d supply, fill #0

## 2020-10-09 MED ORDER — MIDODRINE HCL 10 MG PO TABS
10.0000 mg | ORAL_TABLET | Freq: Three times a day (TID) | ORAL | 0 refills | Status: DC
  Filled 2020-10-09: qty 90, 30d supply, fill #0

## 2020-10-09 MED ORDER — POLYETHYLENE GLYCOL 3350 17 G PO PACK: 17.0000 g | PACK | Freq: Every day | ORAL | 0 refills | Status: AC

## 2020-10-09 MED ORDER — BREO ELLIPTA 200-25 MCG/INH IN AEPB
1.0000 | INHALATION_SPRAY | Freq: Every day | RESPIRATORY_TRACT | 0 refills | Status: DC
  Filled 2020-10-09: qty 1, 1d supply, fill #0

## 2020-10-09 MED ORDER — MAGNESIUM OXIDE 400 MG PO TABS
400.0000 mg | ORAL_TABLET | Freq: Every day | ORAL | 0 refills | Status: DC
  Filled 2020-10-09: qty 30, 30d supply, fill #0

## 2020-10-09 MED ORDER — MAGNESIUM GLUCONATE 500 MG PO TABS
500.0000 mg | ORAL_TABLET | Freq: Every day | ORAL | 0 refills | Status: DC
  Filled 2020-10-09: qty 30, 30d supply, fill #0

## 2020-10-09 MED ORDER — AMIODARONE HCL 200 MG PO TABS
200.0000 mg | ORAL_TABLET | Freq: Every day | ORAL | 0 refills | Status: DC
  Filled 2020-10-09: qty 30, 30d supply, fill #0

## 2020-10-09 MED ORDER — INCRUSE ELLIPTA 62.5 MCG/INH IN AEPB
1.0000 | INHALATION_SPRAY | Freq: Every day | RESPIRATORY_TRACT | 0 refills | Status: DC
  Filled 2020-10-09: qty 1, 1d supply, fill #0

## 2020-10-09 NOTE — Progress Notes (Incomplete)
Physical Therapy Session Note  Patient Details  Name: Gary [name] MRN: [id number] Date of Birth: [birth year]  {CHL IP REHAB PT TIME CALCULATION:[id number]}  Short Term Goals: Week 1:  PT Short Term Goal 1 (Week 1): STG=LTG due to short ELOS  Skilled Therapeutic Interventions/Progress Updates:     Pt received seated in chair in dayroom. Agreeable to therapy and no complaint of pain. Pt on 4L O2 but PT notes that tank is essentially empty and very little oxygen being provided. O2 sats at 91% and drop slowly to 88% over course of ~5 minutes. PT titrates to 2L and oxygen quickly increases to 97^%. Pt ambulates x175' with RW, initially on 2L and increasing to 6L over course of gait distance, with pt noted to be dyspneic. Following ambulation pt at 81% O2 with HR 118. Pt sits with pursed lip breathing and sats increase to >95% with 2-3 minutes, and HR down to 70 bpm. Following extended seated rest break with emphasis on pursed lip breathing, pt instructed to stand and march in place until he feels "shortness of breath symptoms". On 6L O2, pt able to march in place for 1 minute. Pt noted to be dyspneic upon sitting back down. O2 sats monitore and pt drops to 83% with HR into 120s. Pt educated on importance of monitoring level of exertion and taking rest breaks prior to becoming as dyspneic. Pt verbalizes understanding. Pt requests to perform ramp navigation. Pt ambulates up ramp with RW and supervision with cues on safe RW management. Pt also ambulates across "mulch" and down/up 6 inch step with RW. Pt takes extended seated rest break with focus on pursed lip breathing. Stands step transfer to recliner with RW. Left with all needs within reach.  Therapy Documentation Precautions:  Precautions Precautions: Fall,Other (comment) Precaution Comments: Watch SpO2 Restrictions Weight Bearing Restrictions: No    Therapy/Group: Individual Therapy  Breck Coons, PT, DPT 10/09/2020, 4:02 PM

## 2020-10-09 NOTE — Progress Notes (Addendum)
PROGRESS NOTE   Subjective/Complaints: No new issues. Slept well with CPAP and oxygen. Still requiring 4-6L O2.   ROS: Patient denies fever, rash, sore throat, blurred vision, nausea, vomiting, diarrhea, cough,  chest pain, joint or back pain, headache, or mood change.    Objective:   No results found. Recent Labs    10/08/20 0640  WBC 8.4  HGB 9.2*  HCT 30.3*  PLT 210   Recent Labs    10/08/20 0640  NA 134*  K 3.7  CL 98  CO2 28  GLUCOSE 159*  BUN 19  CREATININE 0.96  CALCIUM 7.9*    Intake/Output Summary (Last 24 hours) at 10/09/2020 1230 Last data filed at 10/09/2020 1036 Gross per 24 hour  Intake 600 ml  Output 2700 ml  Net -2100 ml        Physical Exam: Vital Signs Blood pressure 110/70, pulse 70, temperature 98 F (36.7 C), temperature source Oral, resp. rate 20, SpO2 90 %. Constitutional: No distress . Vital signs reviewed. HEENT: EOMI, oral membranes moist Neck: supple Cardiovascular: RRR without murmur. No JVD    Respiratory/Chest: CTA Bilaterally without wheezes or rales. Normal effort, O2 5L   GI/Abdomen: BS +, non-tender, non-distended Ext: no clubbing, cyanosis, or edema Psych: pleasant and cooperative Skin: Warm and dry.   Bridge of nose with scabs--no change Musc: No edema in extremities.  No tenderness in extremities. Neuro: Alert Motor: Bilateral upper extremities: 5/5 proximal distal Bilateral lower extremities: Hip flexion, knee extension 4+/5, ankle dorsiflexion 4+/5  Assessment/Plan: 1. Functional deficits which require 3+ hours per day of interdisciplinary therapy in a comprehensive inpatient rehab setting.  Physiatrist is providing close team supervision and 24 hour management of active medical problems listed below.  Physiatrist and rehab team continue to assess barriers to discharge/monitor patient progress toward functional and medical goals  Care Tool:  Bathing     Body parts bathed by patient: Right arm,Left arm,Abdomen,Chest,Front perineal area,Buttocks,Right upper leg,Left upper leg,Right lower leg,Left lower leg,Face         Bathing assist Assist Level: Set up assist     Upper Body Dressing/Undressing Upper body dressing   What is the patient wearing?: Pull over shirt    Upper body assist Assist Level: Independent with assistive device    Lower Body Dressing/Undressing Lower body dressing      What is the patient wearing?: Pants,Underwear/pull up     Lower body assist Assist for lower body dressing: Supervision/Verbal cueing     Toileting Toileting    Toileting assist Assist for toileting: Supervision/Verbal cueing Assistive Device Comment: Urinal   Transfers Chair/bed transfer  Transfers assist     Chair/bed transfer assist level: Independent with assistive device Chair/bed transfer assistive device: Museum/gallery exhibitions officer assist      Assist level: Supervision/Verbal cueing Assistive device: Walker-rolling Max distance: 386 ft   Walk 10 feet activity   Assist     Assist level: Supervision/Verbal cueing Assistive device: Walker-rolling   Walk 50 feet activity   Assist Walk 50 feet with 2 turns activity did not occur: Safety/medical concerns (decreased activity tolerance, oxygen desaturation to 87%)  Assist level:  Supervision/Verbal cueing Assistive device: Walker-rolling    Walk 150 feet activity   Assist Walk 150 feet activity did not occur: Safety/medical concerns  Assist level: Supervision/Verbal cueing Assistive device: Walker-rolling    Walk 10 feet on uneven surface  activity   Assist     Assist level: Supervision/Verbal cueing Assistive device: Walker-rolling   Wheelchair     Assist Will patient use wheelchair at discharge?: No (Per PT long term goals) Type of Wheelchair: Manual    Wheelchair assist level: Supervision/Verbal cueing Max wheelchair  distance: >200 ft    Wheelchair 50 feet with 2 turns activity    Assist        Assist Level: Supervision/Verbal cueing   Wheelchair 150 feet activity     Assist      Assist Level: Supervision/Verbal cueing   Blood pressure 110/70, pulse 70, temperature 98 F (36.7 C), temperature source Oral, resp. rate 20, SpO2 90 %.  Medical Problem List and Plan: 1.Functional and mobility deficitssecondary to debility after respiratory failure and multiple associated complications  DC home 6/75 with home health 2. Antithrombotics: -DVT/anticoagulation:Pharmaceutical:Other (comment)--Eliquis resumed 05/12 and question plans for [facility] closure. -antiplatelet therapy: N/A 3. Pain Management:N/A 4. Mood:LCSW to follow for evaluation and support. -antipsychotic agents: N/a 5. Neuropsych: This patientiscapable of making decisions on hisown behalf. 6. Skin/Wound Care:Routine pressure relief measures. 7. Fluids/Electrolytes/Nutrition:encourage PO 8. 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  -pulmonary hygiene with flutter valve. CPAP. Yupleri nebs/day. Pulmicort nebs bid.  -Remains oxygen dependent   -Patient presents with chronic hypercapnia associated with COPD/OSA. The use of the NIV will assist with hypercapnia (pCO2 is 58.2 with elevated bicarb on 09/11/20) and can reduce risk of exacerbations and future hospitalizations when used at night and during the day. All alternate devices [phone] and [id number]) have been proven ineffective to provide essential volume control necessary to maintain acceptable CO2 levels. An NIV with AVAPS AE is necessary to prevent patient harm. Interruption or failure to provide NIV would quickly lead to exacerbation of the patient's condition, hospital admission, and likely harm to the patient. Continued use is preferred.  9. PAH with cor pulmonale: Felt to be in  setting of hypoxic lung disease/OHS/OSA -On sildenafil, torsemide, Jardiance and midodrine for BP support.  10. GIB due to gastritis/duodenal ulcers/short segment Barrett's: Now on once daily Protonix.  --Back on DOAC. Monitor for any signs of recurrent bleeding.  11. A fib: Monitor HR tid--controlled on amiodarone and now back on Eliquis.   Rate controlled on 5/24 12. Hyponatremia: mild, stable at 133 13. AKI: Has resolved with normalization of SCr and will likely need to stay dry.   BUN/Creatinine 24/0.94 on 5/18--> 19/0.96 14. Hypomagnesemia: Improved with IV supplementation.   -1.5 again today 5/23-  1.7 today 5/24---increase mag supp to bid   -recheck mg++ tomorrow- 15. Spiculated RML mass: Follow up with PCCM for further work up after d/c. 16. T2DM:   Hgb A1c- 6.7. Continue, jardiance, metformin 1000 mg bid.   CBG (last 3)  Recent Labs    10/08/20 2108 10/09/20 0601 10/09/20 1114  GLUCAP 150* 131* 151*   Fair control  17. Leucocytosis: Likely reactive due to steroids  WBCs  8.4 18.  Hyponatremia  Sodium 133 on 5/18, stable, 134 5/23 19.  Hypoalbuminemia  Supplement initiated  LOS: 7 days A FACE TO FACE EVALUATION WAS PERFORMED  [name] 10/09/2020, 12:30 PM

## 2020-10-09 NOTE — Progress Notes (Signed)
Patient ID: Gary [name], male   DOB: 1951-03-04, 70 y.o.   MRN: [id number]     Advanced Heart Failure Rounding Note  PCP-Cardiologist: Elouise Munroe, MD   Subjective:    Working with CIR. Feels much better. Breathing better.   HR in 70s   Objective:   Weight Range:   There is no height or weight on file to calculate BMI.   Vital Signs:   Temp:  [97.6 F (36.4 C)-98.4 F (36.9 C)] 98.4 F (36.9 C) (05/24 1301) Pulse Rate:  [69-71] 71 (05/24 1301) Resp:  [17-20] 17 (05/24 1301) BP: (102-110)/(59-70) 102/59 (05/24 1301) SpO2:  [89 %-97 %] 97 % (05/24 1301) Last BM Date: 10/07/20  Weight change: There were no vitals filed for this visit.  Intake/Output:   Intake/Output Summary (Last 24 hours) at 10/09/2020 1502 Last data filed at 10/09/2020 1240 Gross per 24 hour  Intake 400 ml  Output 2700 ml  Net -2300 ml      Physical Exam   General:  Sitting in gym. No resp difficulty HEENT: normal Neck: supple. no JVD. Carotids 2+ bilat; no bruits. No lymphadenopathy or thryomegaly appreciated. Cor: PMI nondisplaced. Irregular rate & rhythm. No rubs, gallops or murmurs. Lungs: clear Abdomen: soft, nontender, nondistended. No hepatosplenomegaly. No bruits or masses. Good bowel sounds. Extremities: no cyanosis, clubbing, rash, edema Neuro: alert & orientedx3, cranial nerves grossly intact. moves all 4 extremities w/o difficulty. Affect pleasant    Labs    CBC Recent Labs    10/08/20 0640  WBC 8.4  HGB 9.2*  HCT 30.3*  MCV 89.9  PLT 657   Basic Metabolic Panel Recent Labs    10/08/20 0640 10/09/20 0506  NA 134*  --   K 3.7  --   CL 98  --   CO2 28  --   GLUCOSE 159*  --   BUN 19  --   CREATININE 0.96  --   CALCIUM 7.9*  --   MG 1.5* 1.7   Liver Function Tests No results for input(s): AST, ALT, ALKPHOS, BILITOT, PROT, ALBUMIN in the last 72 hours. No results for input(s): LIPASE, AMYLASE in the last 72 hours. Cardiac Enzymes No results for input(s):  CKTOTAL, CKMB, CKMBINDEX, TROPONINI in the last 72 hours.  BNP: BNP (last 3 results) Recent Labs    09/03/20 1433 09/20/20 0047  BNP 1,202.7* 360.6*    ProBNP (last 3 results) No results for input(s): PROBNP in the last 8760 hours.   D-Dimer No results for input(s): DDIMER in the last 72 hours. Hemoglobin A1C No results for input(s): HGBA1C in the last 72 hours. Fasting Lipid Panel No results for input(s): CHOL, HDL, LDLCALC, TRIG, CHOLHDL, LDLDIRECT in the last 72 hours. Thyroid Function Tests No results for input(s): TSH, T4TOTAL, T3FREE, THYROIDAB in the last 72 hours.  Invalid input(s): FREET3  Other results:   Imaging    No results found.   Medications:     Scheduled Medications: . (feeding supplement) PROSource Plus  30 mL Oral BID BM  . amiodarone  200 mg Oral Daily  . apixaban  5 mg Oral BID  . arformoterol  15 mcg Nebulization BID  . aspirin EC  81 mg Oral Q breakfast  . atorvastatin  40 mg Oral QPM  . budesonide (PULMICORT) nebulizer solution  0.25 mg Nebulization BID  . diltiazem  180 mg Oral Daily  . empagliflozin  10 mg Oral Daily  . ferrous sulfate  325 mg Oral Q  breakfast  . insulin aspart  0-5 Units Subcutaneous QHS  . insulin aspart  0-9 Units Subcutaneous TID WC  . magnesium gluconate  500 mg Oral BID  . mouth rinse  15 mL Mouth Rinse q12n4p  . metFORMIN  1,000 mg Oral BID WC  . midodrine  10 mg Oral TID WC  . multivitamin with minerals  1 tablet Oral Daily  . pantoprazole  40 mg Oral Daily  . polyethylene glycol  17 g Oral Daily  . Ensure Max Protein  11 oz Oral BID  . revefenacin  175 mcg Nebulization Daily  . sildenafil  40 mg Oral TID  . sodium chloride  1 spray Each Nare BID  . torsemide  20 mg Oral Daily    Infusions:   PRN Medications: acetaminophen, alum & mag hydroxide-simeth, bisacodyl, diphenhydrAMINE, guaiFENesin-dextromethorphan, polyethylene glycol, prochlorperazine **OR** prochlorperazine **OR** prochlorperazine,  sodium phosphate, traZODone    Assessment/Plan    1. Acute upper GI bleed with symptomatic anemia - hgb dropped to 6.7. Transfused.->  Eliquis held  - CT negative for RP bleed - EGD with 2 ulcers not acutely bleeding.  - On protonix PO 40 mg bid  - Off  AC for 2 weeks to allow GI healing. Eliquis restarted 5/18 - Biopsy back for H pylori. On treatment per GI - Eliquis restarted 5/12 -> Hgb stable at 9.2  2. PAH with cor pulmonale - CT chest 4/22: No PE or ILD - Echo LVEF 60% severe RV dilation and HK - Cath with moderate PAH. PA = 66/31 (46) PCW = 13 Fick = 4.1/2.1 PVR = 7.8 WU - Suspect this is WHO Group 3 PAH due to hypoxic lung disease/OHS/OSA - Will order PFTs (when better compensated) and will need outpatient sleep study - Serologies for completed. Note abnormal ANA, with borderline elevation in chromatin antibody, but all other serologies negative. ANCA pendingESR only mildly abnormal at 44 - repeat RHC 09/24/20 PA 47/23 (32) PCW 14 - Continue sildenafil 40 mg tid. On midodrine 10 mg three times a day for Bp support. SBP 100-110  - Volume status stable. Continue torsemide 20 mg daily.  - Continue jardiance 10 mg daily  3. Paroxysmal AF - new onset. Unclear duration  - Was in NSR 2 weeks ago --> back in AF - Off AC with recent large GI bleed but restarted last week.  - Continue PO amio 400 mg daily . - Not candidate for DC-CV without AC.  - May be candidate for Watchman LAA closure if we can get him stable on [facility] for 1-2 months. Would avoid long-term amio use given underlying lung disease if at all possible.  - Rates stable in 70s on cardizem 120    4. CAD  -mostly non-obstructive - Continue atorvastatin 40 mg daily.  - No ASA with AC - No s/s ischemia   5. R lung mass - High Res CT- redemonstrated spiculated mass anterior RML with trace left pleural effusion.   6. Shock - Suspect mixed hemorrhagic/cardiogenic (RV failure).   - Resolved  7. Acute hypoxemic  respiratory failure with bilateral pleural effusions - Intubated pre-EGD on 4/22.   - Extubated--> on HFNC-- very slow wean. On 30 L + 40%  - Respiratory status remains tenuous. As noted before, degree of hypoxemia far out of proportion to HF or PAH. Continue with aggressive pulmonary toilet and mobilization. - With response to O2 supplementation shunt physiology less likely.  -s/p R thora with 1200 cc out. (mostly transudative)  -  High Res CT- redemonstrated spiculated mass anterior RML with trace left pleural effusion. - RHC 5/9 w/ mild pulmonary htn, normal left sided filling pressures, high cardiac output. - ? PVOD but would suspect pulmonary pressures to be higher - Echo w/o evidence of peripheral shunting  - Will need PFTs as an outpatient  - Has improved gradually.  - Continue torsemide 20 daily to keep dry  CIR appreciated.   He is planning for d/c tomorrow. Continue current HF meds. We will arrange outpatient f/u with eventual DC-CV. Supp K and mag   Length of Stay: 7  Glori Bickers, MD  10/09/2020, 3:02 PM  Advanced Heart Failure Team Pager [phone] (M-F; 7a - 5p)  Please contact [facility] for night-coverage after hours (5p -7a ) and weekends on [email or web address]

## 2020-10-09 NOTE — Progress Notes (Signed)
Occupational Therapy Session Note  Patient Details  Name: Gary [name] MRN: [id number] Date of Birth: 1951-04-02  Today's Date: 10/09/2020 OT Individual Time: 5001-6429 OT Individual Time Calculation (min): 31 min    Short Term Goals: Week 1:  OT Short Term Goal 1 (Week 1): STG=LTG d/t ELOS  Skilled Therapeutic Interventions/Progress Updates:     Pt received seated in recliner, denies pain, agreeable to therapy. Session focus on activity tolerance and PLB in prep for improved ADL performance. SatO2 at 95% on 5L at start of session. STS and amb to and from gym with RW with close S. Sat02 at 80% and HR at 139 bpm post activity on 6L. Coached through PLB and SatO2 rebounded to 90% within 3 min. In prep for improved activity tolerance/endurance, pt completed 1x20 of chest press + forward/backward arm circles with 4 lb dowel rod with good form. SatO2 decreased to 88%. Reports no concerns prior to DC tomorrow, is very excited to see his 10 dogs. Amb back to room and back to recliner with close S + RW.    Pt left in recliner with chair alarm engaged, call bell in reach, and all immediate needs met.   Therapy Documentation Precautions:  Precautions Precautions: Fall,Other (comment) Precaution Comments: Watch SpO2 Restrictions Weight Bearing Restrictions: No Pain: denies   ADL: See Care Tool for more details.   Therapy/Group: Individual Therapy  Volanda Napoleon MS, OTR/L  10/09/2020, 6:49 AM

## 2020-10-09 NOTE — Progress Notes (Signed)
Occupational Therapy Session Note  Patient Details  Name: Gary [name] MRN: [id number] Date of Birth: [birth year]  Today's Date: 10/09/2020 OT Individual Time: 4033-5331 OT Individual Time Calculation (min): 60 min    Short Term Goals: Week 1:  OT Short Term Goal 1 (Week 1): STG=LTG d/t ELOS  Skilled Therapeutic Interventions/Progress Updates:    Pt received sitting in recliner with no c/o pain. Pt reporting he is feeling a lot better after admin of magnesium via IV. Extensive discussion/education re d/c, home mobility/ADLs, O2 dependence, and fall risk reduction. Upon entry pt on 4L O2 , Spo2 at 86%. Encouraged deep breathing and pt was able to rebound to 93%. Pt completed LB dressing, changing his pants with mod I and required rest break following d/t desaturation to 78%. He rebounded within 60 seconds. Pt completed 100 ft of functional mobility on 6L O2, desaturation to 80% and required rest break before returning to his room. Pt was left sitting up in the recliner with all needs met, 5L O2 via [place].   Therapy Documentation Precautions:  Precautions Precautions: Fall,Other (comment) Precaution Comments: Watch SpO2 Restrictions Weight Bearing Restrictions: No  Therapy/Group: Individual Therapy  Curtis Sites 10/09/2020, 6:19 AM

## 2020-10-09 NOTE — Patient Care Conference (Signed)
Inpatient RehabilitationTeam Conference and Plan of Care Update Date: 10/09/2020   Time: 10:15 AM    Patient Name: Gary [name]      Medical Record Number: [id number]  Date of Birth: 01-14-1951 Sex: Male         Room/Bed: 4W16C/4W16C-01 Payor Info: Payor: MEDICARE / Plan: MEDICARE PART A AND B / Product Type: *No Product type* /    Admit Date/Time:  10/02/2020  2:26 PM  Primary Diagnosis:  [facility] Problems: Principal Problem:   Debility Active Problems:   Hypoalbuminemia due to protein-calorie malnutrition (HCC)   Hyponatremia   Leucocytosis   Hypomagnesemia   AKI (acute kidney injury) ([place])   Supplemental oxygen dependent   Chronic respiratory failure ([place])   COPD with hypoxia [facility])    Expected Discharge Date: Expected Discharge Date: 10/10/20  Team Members Present: Physician leading conference: Dr. Alger Simons Care Coodinator Present: Loralee Pacas, LCSWA;Laurrie Toppin Creig Hines, RN, BSN, [place] Nurse Present: Dorthula Nettles, RN PT Present: Tereasa Coop, PT OT Present: Laverle Hobby, OT PPS Coordinator present : Gunnar Fusi, SLP     Current Status/Progress Goal Weekly Team Focus  Bowel/Bladder   continent x2  remain continent  assess qshift and prn   Swallow/Nutrition/ Hydration             ADL's   at goal level- mod I, requires cueing for rest breaks and SpO2 monitoring, 6LO2 with activity  mod I overall  d/c planning, dynamic standing balance, ADLs, activity tolerance   Mobility   Supervision-mod I overall, gait 373 ft with RW, requires 6 L/min O2 with mobility  Mod I household, supervision community level  Activity tolerance, gait and stair training, balance, functional mobility, energy conservation techniques, patient/caregiver education   Communication   Able to make needs known  patient will continue to verbalize needs  all needs be met qshift   Safety/Cognition/ Behavioral Observations            Pain   denies pain  patient will remain pain free       Skin   skin intact  skin will remain free from injuries        Discharge Planning:  D/c to home with intermittent support from wife as she works; and neighbor lives next door who is able to come by and assist with care needs.   Team Discussion: Will go home on O2, doing well medically. Continent B/B, has pain to the right, treating appropriately. Will have intermittent support at discharge, wife works. Patient on target to meet rehab goals: yes, at goal level with OT, Mod I. Walking > 350 ft with RW. At goal level with PT, mod I. Ready for discharge.  *See Care Plan and progress notes for long and short-term goals.   Revisions to Treatment Plan:  Medically ready for discharge.  Teaching Needs: Family education complete.  Current Barriers to Discharge: Decreased caregiver support, Medical stability, Home enviroment access/layout, Lack of/limited family support, Weight bearing restrictions, Medication compliance, Behavior and New oxygen  Possible Resolutions to Barriers: Continue current medications, provide emotional support.     Medical Summary Current Status: debility after respiratory failure and multiple medical issues. oxygen dependent currently. fluid balanced. osa on cpap  Barriers to Discharge: Medical stability   Possible Resolutions to Barriers/Weekly Focus: daily assessment of labs, oxygen needs,   Continued Need for Acute Rehabilitation Level of Care: The patient requires daily medical management by a physician with specialized training in physical medicine and rehabilitation for the  following reasons: Direction of a multidisciplinary physical rehabilitation program to maximize functional independence : Yes Medical management of patient stability for increased activity during participation in an intensive rehabilitation regime.: Yes Analysis of laboratory values and/or radiology reports with any subsequent need for medication adjustment and/or medical intervention.  : Yes   I attest that I was present, lead the team conference, and concur with the assessment and plan of the team.   Cristi Loron 10/09/2020, 2:30 PM

## 2020-10-09 NOTE — Progress Notes (Signed)
Physical Therapy Discharge Summary  Patient Details  Name: Gary [name] MRN: [id number] Date of Birth: 03-14-1951  Today's Date: 10/10/2020   Patient has met 5 of 7 long term goals due to improved activity tolerance, improved balance, improved postural control, increased strength, decreased pain and ability to compensate for deficits.  Patient to discharge at an ambulatory level Supervision.   Patient's care partner is independent to provide the necessary physical assistance at discharge.  Reasons goals not met: Patient requires supervision for O2 line management and is progressing to mod I for household gait. Patient's wife able to provide this level of assist for the first few days that patient is home. Patient unable to progress to 8 steps for community access, however, only has 3 STE home and is able to continue to progress with this in an OP setting.   Recommendation:  Patient will benefit from ongoing skilled PT services in outpatient setting to continue to advance safe functional mobility, address ongoing impairments in balance, activity tolerance, functional mobility, gait and stair training, strengthening, patient/caregiver education, and minimize fall risk.  Equipment: RW  Reasons for discharge: treatment goals met  Patient/family agrees with progress made and goals achieved: Yes  PT Discharge Precautions/Restrictions Precautions Precautions: Fall;Other (comment) Precaution Comments: Watch SpO2 Restrictions Other Position/Activity Restrictions: 4-6 L/min O2 Vital Signs Therapy Vitals Temp: 98.3 F (36.8 C) Temp Source: Oral Pulse Rate: 75 Resp: 20 BP: 107/61 Patient Position (if appropriate): Sitting Oxygen Therapy SpO2: 95 % O2 Device: Nasal Cannula O2 Flow Rate (L/min): 3 L/min Pain   Vision/Perception  Perception Perception: Within Functional Limits Praxis Praxis: Intact  Cognition Overall Cognitive Status: Within Functional Limits for tasks  assessed Arousal/Alertness: Awake/alert Attention: Selective Selective Attention: Appears intact Memory: Appears intact Awareness: Appears intact Problem Solving: Appears intact Safety/Judgment: Appears intact Sensation Sensation Light Touch: Appears Intact Hot/Cold: Appears Intact Proprioception: Appears Intact Stereognosis: Appears Intact Coordination Gross Motor Movements are Fluid and Coordinated: Yes Fine Motor Movements are Fluid and Coordinated: Yes Coordination and Movement Description: Still with generalized weakness, much improved Motor  Motor Motor: Within Functional Limits Motor - Discharge Observations: Still with generalized weakness and O2 dependence  Mobility Bed Mobility Bed Mobility: Rolling Right;Rolling Left;Supine to Sit;Sit to Supine Rolling Right: Independent Rolling Left: Independent Supine to Sit: Independent Sit to Supine: Independent Transfers Transfers: Sit to Stand;Stand to Sit;Stand Pivot Transfers Sit to Stand: Independent with assistive device Stand to Sit: Independent with assistive device Stand Pivot Transfers: Independent with assistive device Transfer (Assistive device): Rolling walker Locomotion  Gait Ambulation: Yes Gait Assistance: Contact Guard/Touching assist Gait Distance (Feet): 373 Feet (during 6 min walk test with 2 standing rest breaks <10 sec on 6L/min) Assistive device: Rolling walker Gait Gait: Yes Gait Pattern: Decreased stride length;Decreased hip/knee flexion - left;Decreased hip/knee flexion - right;Decreased trunk rotation;Trunk flexed Gait velocity: Decreased Stairs / Additional Locomotion Stairs: Yes Stairs Assistance: Contact Guard/Touching assist;Minimal Assistance - Patient > 75% Stair Management Technique: With walker;Two rails Number of Stairs: 4 Height of Stairs: 6 (3 steps using RW to simulate home entry with, 4 steps B rails) Ramp: Supervision/Verbal cueing (RW) Curb: Supervision/Verbal cueing  (RW) Wheelchair Mobility Wheelchair Mobility: No  Trunk/Postural Assessment  Cervical Assessment Cervical Assessment: Within Functional Limits Thoracic Assessment Thoracic Assessment: Exceptions to [facility] (rounded shoulders) Lumbar Assessment Lumbar Assessment: Within Functional Limits Postural Control Postural Control: Deficits on evaluation (delayed righting reactions)  Balance Balance Balance Assessed: Yes Standardized Balance Assessment Standardized Balance Assessment: Oceanographer Test [facility] Balance Test Sit  to Stand: Able to stand  independently using hands Standing Unsupported: Able to stand safely 2 minutes Sitting with Back Unsupported but Feet Supported on Floor or Stool: Able to sit safely and securely 2 minutes Stand to Sit: Controls descent by using hands Transfers: Able to transfer safely, definite need of hands Standing Unsupported with Eyes Closed: Able to stand 10 seconds safely Standing Ubsupported with Feet Together: Able to place feet together independently and stand 1 minute safely From Standing, Reach Forward with Outstretched Arm: Can reach forward >12 cm safely (5") From Standing Position, Pick up Object from Floor: Able to pick up shoe safely and easily From Standing Position, Turn to Look Behind Over each Shoulder: Looks behind from both sides and weight shifts well Turn 360 Degrees: Able to turn 360 degrees safely in 4 seconds or less Standing Unsupported, Alternately Place Feet on Step/Stool: Able to complete >2 steps/needs minimal assist Standing Unsupported, One Foot in Front: Able to place foot tandem independently and hold 30 seconds Standing on One Leg: Tries to lift leg/unable to hold 3 seconds but remains standing independently Total Score: 46 Dynamic Sitting Balance Dynamic Sitting - Balance Support: Feet supported;During functional activity Dynamic Sitting - Level of Assistance: 7: Independent Dynamic Standing Balance Dynamic Standing - Balance  Support: During functional activity;Right upper extremity supported;Left upper extremity supported Dynamic Standing - Level of Assistance: 6: Modified independent (Device/Increase time) Extremity Assessment  RLE Assessment RLE Assessment: Within Functional Limits Active Range of Motion (AROM) Comments: WFL for functional mobility General Strength Comments: Grossly 4+ to 5/5 throughout LLE Assessment LLE Assessment: Within Functional Limits Active Range of Motion (AROM) Comments: WFL for functional mobility General Strength Comments: Grossly 4+ to 5/5 throughout    Zenda Herskowitz L Laquida Cotrell PT, DPT  10/10/2020, 7:54 AM

## 2020-10-09 NOTE — Progress Notes (Signed)
Occupational Therapy Discharge Summary  Patient Details  Name: Gary [name] MRN: [id number] Date of Birth: 06-13-1950  Today's Date: 10/09/2020 OT Individual Time: 1305-1400 OT Individual Time Calculation (min): 55 min    Patient has met 9 of 9 long term goals due to improved activity tolerance, improved balance, postural control, ability to compensate for deficits and improved awareness.  Patient to discharge at overall Modified Independent level.  Patient's care partner is independent to provide the necessary physical assistance at discharge.  Pt's wife has not completed in person family edu but is comfortable providing supervision.   Reasons goals not met: All treatment goals met.   Recommendation:  Patient will benefit from ongoing skilled OT services in home health setting to continue to advance functional skills in the area of BADL and iADL.  Equipment: No equipment provided  Reasons for discharge: treatment goals met and discharge from hospital  Patient/family agrees with progress made and goals achieved: Yes   Skilled OT Intervention: Pt sitting in recliner with no c/o pain, on 5L O2, SpO2 at 93%. Pt completed 100 ft of functional mobility to the therapy gym with RW at mod I level, on 6L O2, Spo2 dropping to 88% but rebounding to 92% within 2 minutes. Session focused on d/c planning and education, as well as functional activity tolerance/challenging cardiorespiratory endurance. Inconsistent pulse oximeter readings frequently making readings difficult to trust, often used pt symptomology as guide for grading activity. Reciprocal stepping activity completed 2x20 repetitions with extended rest break between trials and supervision overall. Pt did have much better O2 maintenance this session than this morning and this has been a trend the past few days so provided edu on planning home activities. Discussed IADLs participation including kitchen access and cooking, as well as gardening and  strategies to use for energy conservation. Pt given handout re energy conservation. Pt was passed off to PT in the gym.   OT Discharge Precautions/Restrictions  Precautions Precautions: Fall;Other (comment) Precaution Comments: Watch SpO2 Restrictions Weight Bearing Restrictions: No   Vital Signs Therapy Vitals Temp: 98.4 F (36.9 C) Pulse Rate: 71 Resp: 17 BP: (!) 102/59 Patient Position (if appropriate): Sitting Oxygen Therapy SpO2: 97 % O2 Device: Nasal Cannula   ADL ADL Eating: Independent Where Assessed-Eating: Edge of bed Grooming: Independent Where Assessed-Grooming: Sitting at sink Upper Body Bathing: Independent Where Assessed-Upper Body Bathing: Shower Lower Body Bathing: Modified independent Where Assessed-Lower Body Bathing: Shower Upper Body Dressing: Modified independent (Device) Where Assessed-Upper Body Dressing: Chair Lower Body Dressing: Modified independent Where Assessed-Lower Body Dressing: Chair Toileting: Modified independent Where Assessed-Toileting: Glass blower/designer: Diplomatic Services operational officer Method: Counselling psychologist: Energy manager: Modified independent Social research officer, government Method: Heritage manager: Civil engineer, contracting with back Vision Baseline Vision/History: Wears glasses Wears Glasses: At all times Patient Visual Report: No change from baseline Vision Assessment?: No apparent visual deficits Perception  Perception: Within Functional Limits Praxis Praxis: Intact Cognition Overall Cognitive Status: Within Functional Limits for tasks assessed Arousal/Alertness: Awake/alert Orientation Level: Oriented X4 Attention: Selective Selective Attention: Appears intact Memory: Appears intact Awareness: Appears intact Problem Solving: Appears intact Safety/Judgment: Appears intact Sensation Sensation Light Touch: Appears Intact Hot/Cold: Appears  Intact Proprioception: Appears Intact Stereognosis: Appears Intact Coordination Gross Motor Movements are Fluid and Coordinated: Yes Fine Motor Movements are Fluid and Coordinated: Yes Coordination and Movement Description: Still with generalized weakness, much improved Motor  Motor Motor: Within Functional Limits Motor - Discharge Observations: Still with generalized weakness and O2  dependence Mobility  Bed Mobility Bed Mobility: Rolling Right;Rolling Left;Supine to Sit;Sit to Supine Rolling Right: Independent Rolling Left: Independent Supine to Sit: Independent Sit to Supine: Independent Transfers Sit to Stand: Independent with assistive device Stand to Sit: Independent with assistive device  Trunk/Postural Assessment  Cervical Assessment Cervical Assessment: Within Functional Limits Thoracic Assessment Thoracic Assessment: Exceptions to [facility] (rounded shoulders) Lumbar Assessment Lumbar Assessment: Within Functional Limits Postural Control Postural Control: Deficits on evaluation (delayed righting reactions)  Balance Balance Balance Assessed: Yes Dynamic Sitting Balance Dynamic Sitting - Balance Support: No upper extremity supported;Feet supported Dynamic Sitting - Level of Assistance: 6: Modified independent (Device/Increase time) Dynamic Standing Balance Dynamic Standing - Balance Support: During functional activity;No upper extremity supported Dynamic Standing - Level of Assistance: 6: Modified independent (Device/Increase time) Extremity/Trunk Assessment RUE Assessment RUE Assessment: Within Functional Limits LUE Assessment LUE Assessment: Within Functional Limits   Curtis Sites 10/09/2020, 1:17 PM

## 2020-10-09 NOTE — Progress Notes (Signed)
Placed patient on CPAP via FFM, auto titrate settings with 3 lpm O2 bleed in.

## 2020-10-09 NOTE — Progress Notes (Addendum)
Patient ID: Gary [name], male   DOB: 13-Mar-1951, 70 y.o.   MRN: [id number]  SW called pt wife Gary [name]  to provide updates from team conference and pt d/c date tomorrow 5/25. SW explained how pt will go home with home o2. Wife asked about pt having a CPAP machine. Reports pt does not have one. SW informed will explore options for CPAP as typically need a sleep study to be conducted.  SW spoke with [place] liaison to discuss possible options. Discussed triology machine and will see if pt will be approved. Requesting documentation from attending. SW sent documents to attending, and waiting on signature.   SW met with pt and pt wife in room to provide updates, and waiting to see if husband will be approved for triology machine. SW confirms oxygen tanks in room. Requesting pt have portable oxygen tanks. SW informed will ask, however, possibly not likely due to high liter flow. SW informed will follow-up once confirmed.   Loralee Pacas, MSW, [place] Office: [phone] Cell: [phone] Fax: [phone]

## 2020-10-10 ENCOUNTER — Other Ambulatory Visit (HOSPITAL_COMMUNITY): Payer: Self-pay

## 2020-10-10 DIAGNOSIS — R0902 Hypoxemia: Secondary | ICD-10-CM

## 2020-10-10 DIAGNOSIS — J449 Chronic obstructive pulmonary disease, unspecified: Secondary | ICD-10-CM

## 2020-10-10 DIAGNOSIS — J9611 Chronic respiratory failure with hypoxia: Secondary | ICD-10-CM

## 2020-10-10 LAB — GLUCOSE, CAPILLARY
Glucose-Capillary: 160 mg/dL — ABNORMAL HIGH (ref 70–99)
Glucose-Capillary: 188 mg/dL — ABNORMAL HIGH (ref 70–99)

## 2020-10-10 LAB — MAGNESIUM: Magnesium: 1.6 mg/dL — ABNORMAL LOW (ref 1.7–2.4)

## 2020-10-10 NOTE — Progress Notes (Signed)
PROGRESS NOTE   Subjective/Complaints: Had a good night. Anxious to get home!. Tolerated CPAP well.   ROS: Patient denies fever, rash, sore throat, blurred vision, nausea, vomiting, diarrhea, cough,   chest pain, joint or back pain, headache, or mood change.   Objective:   No results found. Recent Labs    10/08/20 0640  WBC 8.4  HGB 9.2*  HCT 30.3*  PLT 210   Recent Labs    10/08/20 0640  NA 134*  K 3.7  CL 98  CO2 28  GLUCOSE 159*  BUN 19  CREATININE 0.96  CALCIUM 7.9*    Intake/Output Summary (Last 24 hours) at 10/10/2020 0834 Last data filed at 10/10/2020 0511 Gross per 24 hour  Intake 200 ml  Output 2650 ml  Net -2450 ml        Physical Exam: Vital Signs Blood pressure 107/61, pulse 75, temperature 98.3 F (36.8 C), temperature source Oral, resp. rate 20, SpO2 95 %. Constitutional: No distress . Vital signs reviewed. HEENT: EOMI, oral membranes moist Neck: supple Cardiovascular: RRR without murmur. No JVD    Respiratory/Chest: CTA Bilaterally without wheezes or rales. Normal effort    GI/Abdomen: BS +, non-tender, non-distended Ext: no clubbing, cyanosis, or edema Psych: pleasant and cooperative Skin: Warm and dry.   Bridge of nose with scabs--no change Musc: No edema in extremities.  No tenderness in extremities. Neuro: Alert Motor: Bilateral upper extremities: 5/5 proximal distal Bilateral lower extremities: Hip flexion, knee extension 4+/5, ankle dorsiflexion 4+/5  Assessment/Plan: 1. Functional deficits which require 3+ hours per day of interdisciplinary therapy in a comprehensive inpatient rehab setting.  Physiatrist is providing close team supervision and 24 hour management of active medical problems listed below.  Physiatrist and rehab team continue to assess barriers to discharge/monitor patient progress toward functional and medical goals  Care Tool:  Bathing    Body parts  bathed by patient: Right arm,Left arm,Abdomen,Chest,Front perineal area,Buttocks,Right upper leg,Left upper leg,Right lower leg,Left lower leg,Face         Bathing assist Assist Level: Independent with assistive device     Upper Body Dressing/Undressing Upper body dressing   What is the patient wearing?: Pull over shirt    Upper body assist Assist Level: Independent with assistive device    Lower Body Dressing/Undressing Lower body dressing      What is the patient wearing?: Pants,Underwear/pull up     Lower body assist Assist for lower body dressing: Independent with assitive device     Toileting Toileting    Toileting assist Assist for toileting: Independent with assistive device Assistive Device Comment: Urinal   Transfers Chair/bed transfer  Transfers assist     Chair/bed transfer assist level: Independent with assistive device Chair/bed transfer assistive device: Museum/gallery exhibitions officer assist      Assist level: Supervision/Verbal cueing Assistive device: Walker-rolling Max distance: 373 ft   Walk 10 feet activity   Assist     Assist level: Supervision/Verbal cueing Assistive device: Walker-rolling   Walk 50 feet activity   Assist Walk 50 feet with 2 turns activity did not occur: Safety/medical concerns (decreased activity tolerance, oxygen desaturation to 87%)  Assist level: Supervision/Verbal cueing Assistive device: Walker-rolling    Walk 150 feet activity   Assist Walk 150 feet activity did not occur: Safety/medical concerns  Assist level: Supervision/Verbal cueing Assistive device: Walker-rolling    Walk 10 feet on uneven surface  activity   Assist     Assist level: Supervision/Verbal cueing Assistive device: Aeronautical engineer Will patient use wheelchair at discharge?: No Type of Wheelchair: Manual Wheelchair activity did not occur: N/A  Wheelchair assist level:  Supervision/Verbal cueing Max wheelchair distance: >200 ft    Wheelchair 50 feet with 2 turns activity    Assist    Wheelchair 50 feet with 2 turns activity did not occur: N/A   Assist Level: Supervision/Verbal cueing   Wheelchair 150 feet activity     Assist  Wheelchair 150 feet activity did not occur: N/A   Assist Level: Supervision/Verbal cueing   Blood pressure 107/61, pulse 75, temperature 98.3 F (36.8 C), temperature source Oral, resp. rate 20, SpO2 95 %.  Medical Problem List and Plan: 1.Functional and mobility deficitssecondary to debility after respiratory failure and multiple associated complications  DC home today  -f/u with cardiology, primary, pulmonary 2. Antithrombotics: -DVT/anticoagulation:Pharmaceutical:Other (comment)--Eliquis resumed 05/12 and question plans for [facility] closure. -antiplatelet therapy: N/A 3. Pain Management:N/A 4. Mood:LCSW to follow for evaluation and support. -antipsychotic agents: N/a 5. Neuropsych: This patientiscapable of making decisions on hisown behalf. 6. Skin/Wound Care:Routine pressure relief measures. 7. Fluids/Electrolytes/Nutrition:encourage PO 8. 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  -pulmonary hygiene with flutter valve. CPAP. Yupleri nebs/day. Pulmicort nebs bid.  -Remains oxygen dependent --home on oxygen 5L  -Patient presents with chronic hypercapnia associated with COPD/OSA. The use of the NIV will assist with hypercapnia (pCO2 is 58.2 with elevated bicarb on 09/11/20) and can reduce risk of exacerbations and future hospitalizations when used at night and during the day. All alternate devices [id number] and [id number]) have been proven ineffective to provide essential volume control necessary to maintain acceptable CO2 levels. An NIV with AVAPS AE is necessary to prevent patient harm. Interruption or failure to  provide NIV would quickly lead to exacerbation of the patient's condition, hospital admission, and likely harm to the patient. Continued use is preferred.  9. PAH with cor pulmonale: Felt to be in setting of hypoxic lung disease/OHS/OSA -On sildenafil, torsemide, Jardiance and midodrine for BP support.  10. GIB due to gastritis/duodenal ulcers/short segment Barrett's: Now on once daily Protonix.  --Back on DOAC. Monitor for any signs of recurrent bleeding.  11. A fib: Monitor HR tid--controlled on amiodarone and now back on Eliquis.   Rate controlled on 5/25 12. Hyponatremia: mild, stable at 133 13. AKI: Has resolved with normalization of SCr and will likely need to stay dry.   BUN/Creatinine 24/0.94 on 5/18--> 19/0.96 14. Hypomagnesemia: Improved with IV supplementation.   -1.6 today. Continue bid supplementation  -f/u with cards/primary 15. Spiculated RML mass: Follow up with PCCM for further work up after d/c. 16. T2DM:   Hgb A1c- 6.7. Continue, jardiance, metformin 1000 mg bid.   CBG (last 3)  Recent Labs    10/09/20 1648 10/09/20 2108 10/10/20 0605  GLUCAP 170* 156* 160*   Fair control  17. Leucocytosis: Likely reactive due to steroids  WBCs  8.4 18.  Hyponatremia  Sodium 133 on 5/18, stable, 134 5/23 19.  Hypoalbuminemia  Supplement initiated  LOS: 8 days A FACE TO FACE EVALUATION WAS PERFORMED  Gary [name] 10/10/2020, 8:34 AM

## 2020-10-10 NOTE — Progress Notes (Signed)
Inpatient Rehabilitation Care Coordinator Discharge Note  The overall goal for the admission was met for:   Discharge location: Yes. D/c to home with his wife who will provide intermittent support and support from a neighbor.   Length of Stay: Yes. 7 days.   Discharge activity level: Yes. Supervision for ambulation.  Home/community participation: Yes. Limited.   Services provided included: MD, RD, PT, OT, RN, CM, TR, Pharmacy, Neuropsych and SW  Financial Services: Medicare  Choices offered to/list presented to:Yes  Follow-up services arranged: Outpatient: [facility] Outpatient PT/OT and DME: [facility] [place] for oxygen and triology machine  Comments (or additional information):  Patient/Family verbalized understanding of follow-up arrangements: Yes  Individual responsible for coordination of the follow-up plan: Contact pt wife [name] [phone]  Confirmed correct DME delivered: Rana Snare 10/10/2020    Rana Snare

## 2020-10-10 NOTE — Progress Notes (Addendum)
Patient ID: Gary [name], male   DOB: 08/13/1950, 70 y.o.   MRN: [id number]   SW sent [facility] requested forms to hopefully get pt approved for triology machine.  *SW received updates that pt was approved. He intends to follow-up with patient wife to inform on triology machine and waiting on follow-up on when their RT can come out to the home for setup.SW also asked to review all DME with regard to oxygen that will be delivered as well, and wife request for a portable tank since pt would like to use at church. Confirms their RT will need to see pt due to high liter oxygen flow.   SW confirmed with physicians pt can use oxygen at night until triology machine is set up at home. SW emailed pt wife on above details with regard to the triology machine and encouraged follow-up with SW due to giving updates with patient's and may not be able to see her before I leave.   SW faxed outpatient PT/OT referral to [facility] (p:[phone]/f:[phone]). *SW confirmed with [facility] referral received.  *SW informed later that pt and wife were waiting for SW. SW was not aware as was giving updates. SW left message for pt wife [name] [phone] apologizing for inconvience, and encouraged follow-up. SW also sent email apologizing again, and informed them updates from [place] is that the RT will come out to the home tomorrow morning to set up the triology machine, and RT will call as well. SW advised per attending pt should continue to use oxygen at bedtime until he has triology machine. SW encouraged follow-up if needed.   Loralee Pacas, MSW, [place] Office: [phone] Cell: [phone] Fax: [phone]

## 2020-10-11 ENCOUNTER — Other Ambulatory Visit (HOSPITAL_COMMUNITY): Payer: Self-pay

## 2020-10-11 ENCOUNTER — Telehealth (HOSPITAL_COMMUNITY): Payer: Self-pay

## 2020-10-11 NOTE — Discharge Summary (Signed)
Physician Discharge Summary  Patient ID: Gary [name] MRN: [id number] DOB/AGE: [birth year] 72 y.o.  Admit date: 10/02/2020 Discharge date: 10/10/2020  Discharge Diagnoses:  Principal Problem:   Debility Active Problems:   Atrial fibrillation ([place])   Hypoalbuminemia due to protein-calorie malnutrition (HCC)   Hyponatremia   Leucocytosis   Hypomagnesemia   Supplemental oxygen dependent   Chronic respiratory failure (HCC)   COPD with hypoxia (HCC)   Lung mass   Acute blood loss anemia   Discharged Condition: stable   Significant Diagnostic Studies: N/A   Labs:  Basic Metabolic Panel: Recent Labs  Lab 10/08/20 0640 10/09/20 0506 10/10/20 0512  NA 134*  --   --   K 3.7  --   --   CL 98  --   --   CO2 28  --   --   GLUCOSE 159*  --   --   BUN 19  --   --   CREATININE 0.96  --   --   CALCIUM 7.9*  --   --   MG 1.5* 1.7 1.6*    CBC: CBC Latest Ref Rng & Units 10/08/2020 10/03/2020 10/02/2020  WBC 4.0 - 10.5 K/uL 8.4 11.0(H) 12.4(H)  Hemoglobin 13.0 - 17.0 g/dL 9.2(L) 9.6(L) 9.9(L)  Hematocrit 39.0 - 52.0 % 30.3(L) 31.1(L) 31.1(L)  Platelets 150 - 400 K/uL 210 333 405(H)    CBG: Recent Labs  Lab 10/09/20 1114 10/09/20 1648 10/09/20 2108 10/10/20 0605 10/10/20 1135  GLUCAP 151* 170* 156* 160* 188*    Brief HPI:   Gary [name] is a 70 y.o. male with history of T2DM, HTN, epistaxis who was admitted on 09/03/2020 with acute hypoxic respiratory failure and required IV diuresis for fluid overload and was noted to be in A. fib.  Work-up done revealing bilateral pleural effusions as well as chronically occluded distal RCA, moderate PAH with reduced cardiac output and nonobstructive CAD.  He was started on Eliquis but developed acute blood loss anemia with melena due to bleeding duodenal ulcer, erosive gastritis and short segment Barrett's esophagus.  He was treated with IV Protonix, treated for H. pylori and transition to p.o. PPI postextubation.  Hypertension  was resolving and fluid overload managed with milrinone as well as medication adjustment by CHF team.   He continued to require high flow oxygen and symptoms were felt to be multifactorial due to cor pulmonale.  He underwent thoracocentesis of right pleural effusion and follow-up CT chest showed spiculated masslike opacity anterior RML with right hilar and mediastinal adenopathy concerning for malignancy. Plans for bronch/EBUS in the future. He was treated with pulse dose steroids and repeat cardiac cath 05/09  showed mild pulmonary hypertension with normal filling pressures.  He continued to have poor activity tolerance with hypoxia affecting ADLs and mobility. CIR was recommended due to functional decline.    Hospital Course: [name] was admitted to rehab 10/02/2020 for inpatient therapies to consist of PT and OT at least three hours five days a week. Past admission physiatrist, therapy team and rehab RN have worked together to provide customized collaborative inpatient rehab.  His blood pressures were monitored on TID basis and cardiology was following for adjustment of cardiac medications as indicated. He was showing improvement in CPAP use and was approved for Triology use at home.  His respiratory status continues to be stable with use of 5 L of oxygen per nasal cannula.  He was tolerating DOAC without any signs of recurrent bleeding.  Follow  up CBC showed H/H to be stable and reactive leucocytosis had resolved.   Heart rate was controlled on amiodarone.  He continues on magnesium twice daily for supplementation.  Follow-up labs showed hyponatremia to be slowly improving and renal status is stable.  Diabetes was monitored with a CHS CBG checks and blood sugars have been reasonably controlled.  Protein supplements added to help with low calorie malnutrition and his overall p.o. intake has been good. He has made good gains and was modified independent in supervised setting. He will continue to  receive follow up Outpatient PT and OT at [facility]  after discharge.    Rehab course: During patient's stay in rehab weekly team conferences were held to monitor patient's progress, set goals and discuss barriers to discharge. At admission, patient required min assist with ADL tasks and with mobility. He has had improvement in activity tolerance, balance, postural control as well as ability to compensate for deficits..  He was able to complete ADL tasks at modified independent level.  He was modified independent for transfers and to ambulate household distances.  He requires supervision due to safety with oxygen use.  Family education not completed but wife was is to provide supervision needed.   Discharge disposition: 01-Home or Self Care  Diet: Heart healthy/carb modified.  Special Instructions: 1. Need to wear CPAP when sleeping/napping.  2. No driving, smoking or alcohol.   Allergies as of 10/10/2020      Reactions   Novocain [procaine] Other (See Comments)   Unknown reaction as a young child      Medication List    STOP taking these medications   feeding supplement (GLUCERNA SHAKE) Liqd   mouth rinse Liqd solution   saline Gel   sodium chloride 0.65 % Soln nasal spray Commonly known as: OCEAN     TAKE these medications   acetaminophen 325 MG tablet Commonly known as: TYLENOL Take 650 mg by mouth every 6 (six) hours as needed for headache (pain).   amiodarone 200 MG tablet Commonly known as: PACERONE Take 1 tablet (200 mg total) by mouth daily.   aspirin EC 81 MG tablet Take 81 mg by mouth every morning. Swallow whole.   atorvastatin 40 MG tablet Commonly known as: LIPITOR Take 1 tablet (40 mg total) by mouth every evening.   Breo Ellipta 200-25 MCG/INH Aepb Generic drug: fluticasone furoate-vilanterol Inhale 1 puff into the lungs daily.   Cartia XT 180 MG 24 hr capsule Generic drug: diltiazem Take 1 capsule (180 mg total) by mouth daily.   Eliquis 5  MG Tabs tablet Generic drug: apixaban Take 1 tablet (5 mg total) by mouth 2 (two) times daily.   FeroSul 325 (65 FE) MG tablet Generic drug: ferrous sulfate Take 1 tablet (325 mg total) by mouth daily with breakfast.   Incruse Ellipta 62.5 MCG/INH Aepb Generic drug: umeclidinium bromide Inhale 1 puff into the lungs daily.   Jardiance 10 MG Tabs tablet Generic drug: empagliflozin Take 1 tablet (10 mg total) by mouth daily.   magnesium gluconate 500 MG tablet Commonly known as: MAGONATE Take 1 tablet (500 mg total) by mouth 2 (two) times daily.   metFORMIN 500 MG tablet Commonly known as: GLUCOPHAGE Take 2 tablets (1,000 mg total) by mouth 2 (two) times daily with a meal.   midodrine 10 MG tablet Commonly known as: PROAMATINE Take 1 tablet (10 mg total) by mouth 3 (three) times daily with meals.   multivitamin with minerals Tabs tablet Take 1  tablet by mouth daily.   pantoprazole 40 MG tablet Commonly known as: PROTONIX Take 1 tablet (40 mg total) by mouth daily.   polyethylene glycol 17 g packet Commonly known as: MIRALAX / GLYCOLAX Take 17 g by mouth daily.   potassium chloride SA 20 MEQ tablet Commonly known as: KLOR-CON Take 1 tablet (20 mEq total) by mouth daily.   sildenafil 20 MG tablet Commonly known as: REVATIO Take 2 tablets (40 mg total) by mouth 3 (three) times daily.   torsemide 20 MG tablet Commonly known as: DEMADEX Take 1 tablet (20 mg total) by mouth daily.       Follow-up Information    [name], MD Follow up.   Specialty: Physical Medicine and Rehabilitation Why: No formal follow-up needed Contact information: [address] [place] [address] [phone]        Elouise Munroe, MD Follow up.   Specialties: Cardiology, Radiology Why: Call for appointment Contact information: [address] [place] [address] [phone]        Jackquline Denmark, MD Follow up.   Specialties:  Gastroenterology, Internal Medicine Why: As needed Contact information: [place] [address] [phone]        Collene Gobble, MD Follow up.   Specialty: Pulmonary Disease Why: call for appointment Contact information: [place] [address] [phone]               Signed: Bary Leriche 10/15/2020, 1:43 AM

## 2020-10-11 NOTE — Telephone Encounter (Signed)
Pharmacy Transitions of Care Follow-up Telephone Call  Date of discharge: 10/10/20  Discharge Diagnosis: Debility   How have you been since you were released from the hospital? Well  Medication changes made at discharge:  - START: Magnesium gluconate, miralax, Klor-Con  - STOPPED: Nasal spray, mouth rinse, saline gel, and feeding supplement   - CHANGED: None  Medication changes verified by the patient? Yes    Medication Accessibility:  Home Pharmacy: Mail Order   Was the patient provided with refills on discharged medications? No   Have all prescriptions been transferred from [facility] to home pharmacy? N/A  Is the patient able to afford medications? Yes . Notable copays: Only low cost medication was the Eliquis and Jardiance which were free. Other medications were between 8-18 dollars each . Eligible patient assistance: N/A    Medication Review: APIXABAN (ELIQUIS)  Continue apixaban 5 mg BID.  - Discussed importance of taking medication around the same time everyday  - Reviewed potential DDIs with patient  - Advised patient of medications to avoid (NSAIDs, ASA)  - Educated that Tylenol (acetaminophen) will be the preferred analgesic to prevent risk of bleeding  - Emphasized importance of monitoring for signs and symptoms of bleeding (abnormal bruising, prolonged bleeding, nose bleeds, bleeding from gums, discolored urine, black tarry stools)  - Advised patient to alert all providers of anticoagulation therapy prior to starting a new medication or having a procedure.  Follow-up Appointments:  F/u with Dr. Naaman Plummer F/u with Dr. Margaretann Loveless and call for an appt F/u with Dr. Lamonte Sakai and call for an appt  If their condition worsens, is the pt aware to call PCP or go to the Emergency Dept.? Yes  Final Patient Assessment: Pt was on apix 61m before admission and medication was restarted after discharge.

## 2020-10-15 DIAGNOSIS — D62 Acute posthemorrhagic anemia: Secondary | ICD-10-CM

## 2020-10-15 DIAGNOSIS — R918 Other nonspecific abnormal finding of lung field: Secondary | ICD-10-CM

## 2020-10-17 LAB — FUNGUS CULTURE WITH STAIN

## 2020-10-17 LAB — FUNGAL ORGANISM REFLEX

## 2020-10-17 LAB — FUNGUS CULTURE RESULT

## 2020-10-23 ENCOUNTER — Telehealth: Payer: Self-pay

## 2020-10-23 NOTE — Telephone Encounter (Signed)
SW received e-mail from patient wife [name] who was asking about patient switching from outpatient therapies at [facility] and switching to home health therapies, and patient being low on Jardiance medication. SW informed will ask the physician if patient can switch. However, encouraged her to follow-up with the clinic going forward with concerns and medication needs since SW does not follow patients once they discharge from the hospital. SW informed rehab physician, and waiting on next steps.

## 2020-10-24 ENCOUNTER — Telehealth: Payer: Self-pay

## 2020-10-24 NOTE — Telephone Encounter (Signed)
SW spoke with Jackelyn Poling Dabbs/[facility] ([phone]/f:[phone]) to discuss referral. Reports they will come to the home to provide outpatient therapies for patient since she understands there has been a challenge with getting him to therapies. States they only have PT at this time, but will incorporate outpatient therapy goals, and it will be billed as outpatient therapy.  SW updated pt wife via email.   *Wife reports she was able to get the medication patient needed filled through his PCP.  Case closed to SW.

## 2020-10-30 ENCOUNTER — Other Ambulatory Visit (INDEPENDENT_AMBULATORY_CARE_PROVIDER_SITE_OTHER): Payer: Self-pay | Admitting: Physician Assistant

## 2020-10-30 ENCOUNTER — Other Ambulatory Visit (HOSPITAL_COMMUNITY): Payer: Self-pay

## 2020-10-30 DIAGNOSIS — I509 Heart failure, unspecified: Secondary | ICD-10-CM

## 2020-10-30 DIAGNOSIS — M6281 Muscle weakness (generalized): Secondary | ICD-10-CM | POA: Diagnosis not present

## 2020-10-30 DIAGNOSIS — J9610 Chronic respiratory failure, unspecified whether with hypoxia or hypercapnia: Secondary | ICD-10-CM | POA: Diagnosis not present

## 2020-10-30 DIAGNOSIS — R5381 Other malaise: Secondary | ICD-10-CM | POA: Diagnosis not present

## 2020-10-30 DIAGNOSIS — R0902 Hypoxemia: Secondary | ICD-10-CM | POA: Diagnosis not present

## 2020-11-01 ENCOUNTER — Telehealth: Payer: Self-pay

## 2020-11-01 NOTE — Telephone Encounter (Signed)
Debbie, PT from [facility] requesting verbal orders for HHPT 12 visits and she was saying something else but her phone was going in and out, couldn't understand. I called Debbie back and left message with approval and to call back about the other.

## 2020-11-06 ENCOUNTER — Other Ambulatory Visit: Payer: Self-pay | Admitting: Physician Assistant

## 2020-11-06 DIAGNOSIS — R0902 Hypoxemia: Secondary | ICD-10-CM | POA: Diagnosis not present

## 2020-11-06 DIAGNOSIS — R5381 Other malaise: Secondary | ICD-10-CM | POA: Diagnosis not present

## 2020-11-06 DIAGNOSIS — J9610 Chronic respiratory failure, unspecified whether with hypoxia or hypercapnia: Secondary | ICD-10-CM | POA: Diagnosis not present

## 2020-11-06 DIAGNOSIS — M6281 Muscle weakness (generalized): Secondary | ICD-10-CM | POA: Diagnosis not present

## 2020-11-07 ENCOUNTER — Other Ambulatory Visit: Payer: Self-pay | Admitting: Physician Assistant

## 2020-11-07 ENCOUNTER — Other Ambulatory Visit (INDEPENDENT_AMBULATORY_CARE_PROVIDER_SITE_OTHER): Payer: Self-pay | Admitting: Physician Assistant

## 2020-11-08 ENCOUNTER — Other Ambulatory Visit (INDEPENDENT_AMBULATORY_CARE_PROVIDER_SITE_OTHER): Payer: Self-pay | Admitting: Physician Assistant

## 2020-11-08 DIAGNOSIS — M6281 Muscle weakness (generalized): Secondary | ICD-10-CM | POA: Diagnosis not present

## 2020-11-08 DIAGNOSIS — J9610 Chronic respiratory failure, unspecified whether with hypoxia or hypercapnia: Secondary | ICD-10-CM | POA: Diagnosis not present

## 2020-11-08 DIAGNOSIS — R0902 Hypoxemia: Secondary | ICD-10-CM | POA: Diagnosis not present

## 2020-11-08 DIAGNOSIS — R5381 Other malaise: Secondary | ICD-10-CM | POA: Diagnosis not present

## 2020-11-13 DIAGNOSIS — J9610 Chronic respiratory failure, unspecified whether with hypoxia or hypercapnia: Secondary | ICD-10-CM | POA: Diagnosis not present

## 2020-11-13 DIAGNOSIS — R0902 Hypoxemia: Secondary | ICD-10-CM | POA: Diagnosis not present

## 2020-11-13 DIAGNOSIS — R5381 Other malaise: Secondary | ICD-10-CM | POA: Diagnosis not present

## 2020-11-13 DIAGNOSIS — M6281 Muscle weakness (generalized): Secondary | ICD-10-CM | POA: Diagnosis not present

## 2020-11-14 NOTE — Telephone Encounter (Signed)
Dr. Valeta Harms, please see mychart message sent by pt and advise: [place], Loralie Champagne, DO 15 hours ago (7:42 PM)  Dr Valeta Harms, how can i get a portable o2 concentrator i can wear out side ? I have a house concentrator and 3 small D tanks but it is hard to move around with the tanks especially at [place] or [place]? I have mentioned this to my friend at [facility] here in [place] and she said she could possibly get me one but since we were referred to [facility] that she would need a rx for it. [facility] is out of [place] And have not been alot of help.. please help me with this. Thx RAYNELL SCOTT   Please advise if you would be okay with pt switching DMEs to [facility] in [place], [place]. Also, we might need to have pt come back to the office to see if he could tolerate POC based on how many liters he needs.

## 2020-11-15 DIAGNOSIS — R5381 Other malaise: Secondary | ICD-10-CM | POA: Diagnosis not present

## 2020-11-15 DIAGNOSIS — J9610 Chronic respiratory failure, unspecified whether with hypoxia or hypercapnia: Secondary | ICD-10-CM | POA: Diagnosis not present

## 2020-11-15 DIAGNOSIS — M6281 Muscle weakness (generalized): Secondary | ICD-10-CM | POA: Diagnosis not present

## 2020-11-15 DIAGNOSIS — R0902 Hypoxemia: Secondary | ICD-10-CM | POA: Diagnosis not present

## 2020-11-21 ENCOUNTER — Telehealth: Payer: Self-pay

## 2020-11-21 DIAGNOSIS — I4891 Unspecified atrial fibrillation: Secondary | ICD-10-CM | POA: Diagnosis not present

## 2020-11-21 DIAGNOSIS — I272 Pulmonary hypertension, unspecified: Secondary | ICD-10-CM | POA: Diagnosis not present

## 2020-11-21 DIAGNOSIS — E119 Type 2 diabetes mellitus without complications: Secondary | ICD-10-CM | POA: Diagnosis not present

## 2020-11-21 DIAGNOSIS — G4733 Obstructive sleep apnea (adult) (pediatric): Secondary | ICD-10-CM | POA: Diagnosis not present

## 2020-11-21 DIAGNOSIS — D509 Iron deficiency anemia, unspecified: Secondary | ICD-10-CM | POA: Diagnosis not present

## 2020-11-21 DIAGNOSIS — E7849 Other hyperlipidemia: Secondary | ICD-10-CM | POA: Diagnosis not present

## 2020-11-21 DIAGNOSIS — I10 Essential (primary) hypertension: Secondary | ICD-10-CM | POA: Diagnosis not present

## 2020-11-21 DIAGNOSIS — Z8711 Personal history of peptic ulcer disease: Secondary | ICD-10-CM | POA: Diagnosis not present

## 2020-11-21 NOTE — Telephone Encounter (Signed)
ATC Gary [name] with [employer] to ask if one pt could have trilogy and home O2 with one DME company ([employer]) and a POC with a different DME Counsellor). Question is in reference to a MyChart message sent by pt.

## 2020-11-21 NOTE — Telephone Encounter (Signed)
Spoke with Mariann Laster at [facility] and in order for pt to switch all O2 needs over to them [facility] we would need a new order. Dr. Valeta Harms please advise on this.

## 2020-12-02 ENCOUNTER — Emergency Department (HOSPITAL_COMMUNITY): Payer: Medicare Other

## 2020-12-02 ENCOUNTER — Encounter (HOSPITAL_COMMUNITY): Payer: Self-pay | Admitting: Emergency Medicine

## 2020-12-02 ENCOUNTER — Inpatient Hospital Stay (HOSPITAL_COMMUNITY)
Admission: EM | Admit: 2020-12-02 | Discharge: 2020-12-11 | DRG: 291 | Disposition: A | Discharge status: Home Health | Payer: Medicare Other | Attending: Family Medicine | Admitting: Family Medicine

## 2020-12-02 ENCOUNTER — Other Ambulatory Visit: Payer: Self-pay

## 2020-12-02 DIAGNOSIS — T380X5A Adverse effect of glucocorticoids and synthetic analogues, initial encounter: Secondary | ICD-10-CM | POA: Diagnosis present

## 2020-12-02 DIAGNOSIS — E662 Morbid (severe) obesity with alveolar hypoventilation: Secondary | ICD-10-CM | POA: Diagnosis present

## 2020-12-02 DIAGNOSIS — J811 Chronic pulmonary edema: Secondary | ICD-10-CM | POA: Diagnosis not present

## 2020-12-02 DIAGNOSIS — S0031XA Abrasion of nose, initial encounter: Secondary | ICD-10-CM | POA: Diagnosis present

## 2020-12-02 DIAGNOSIS — I2510 Atherosclerotic heart disease of native coronary artery without angina pectoris: Secondary | ICD-10-CM | POA: Diagnosis present

## 2020-12-02 DIAGNOSIS — I4819 Other persistent atrial fibrillation: Secondary | ICD-10-CM | POA: Diagnosis not present

## 2020-12-02 DIAGNOSIS — Z820 Family history of epilepsy and other diseases of the nervous system: Secondary | ICD-10-CM

## 2020-12-02 DIAGNOSIS — R059 Cough, unspecified: Secondary | ICD-10-CM | POA: Diagnosis not present

## 2020-12-02 DIAGNOSIS — I272 Pulmonary hypertension, unspecified: Secondary | ICD-10-CM | POA: Diagnosis not present

## 2020-12-02 DIAGNOSIS — I2721 Secondary pulmonary arterial hypertension: Secondary | ICD-10-CM | POA: Diagnosis present

## 2020-12-02 DIAGNOSIS — J9621 Acute and chronic respiratory failure with hypoxia: Secondary | ICD-10-CM | POA: Diagnosis present

## 2020-12-02 DIAGNOSIS — E785 Hyperlipidemia, unspecified: Secondary | ICD-10-CM | POA: Diagnosis not present

## 2020-12-02 DIAGNOSIS — R0603 Acute respiratory distress: Secondary | ICD-10-CM

## 2020-12-02 DIAGNOSIS — Z9889 Other specified postprocedural states: Secondary | ICD-10-CM

## 2020-12-02 DIAGNOSIS — R918 Other nonspecific abnormal finding of lung field: Secondary | ICD-10-CM | POA: Diagnosis not present

## 2020-12-02 DIAGNOSIS — J948 Other specified pleural conditions: Secondary | ICD-10-CM | POA: Diagnosis not present

## 2020-12-02 DIAGNOSIS — E1169 Type 2 diabetes mellitus with other specified complication: Secondary | ICD-10-CM | POA: Diagnosis present

## 2020-12-02 DIAGNOSIS — I110 Hypertensive heart disease with heart failure: Secondary | ICD-10-CM | POA: Diagnosis present

## 2020-12-02 DIAGNOSIS — Z7982 Long term (current) use of aspirin: Secondary | ICD-10-CM

## 2020-12-02 DIAGNOSIS — I959 Hypotension, unspecified: Secondary | ICD-10-CM | POA: Diagnosis present

## 2020-12-02 DIAGNOSIS — Z6827 Body mass index (BMI) 27.0-27.9, adult: Secondary | ICD-10-CM

## 2020-12-02 DIAGNOSIS — R911 Solitary pulmonary nodule: Secondary | ICD-10-CM | POA: Diagnosis not present

## 2020-12-02 DIAGNOSIS — Z20822 Contact with and (suspected) exposure to covid-19: Secondary | ICD-10-CM | POA: Diagnosis present

## 2020-12-02 DIAGNOSIS — Z823 Family history of stroke: Secondary | ICD-10-CM

## 2020-12-02 DIAGNOSIS — E876 Hypokalemia: Secondary | ICD-10-CM | POA: Diagnosis present

## 2020-12-02 DIAGNOSIS — I50813 Acute on chronic right heart failure: Secondary | ICD-10-CM | POA: Diagnosis not present

## 2020-12-02 DIAGNOSIS — Z888 Allergy status to other drugs, medicaments and biological substances status: Secondary | ICD-10-CM

## 2020-12-02 DIAGNOSIS — J9622 Acute and chronic respiratory failure with hypercapnia: Secondary | ICD-10-CM | POA: Diagnosis not present

## 2020-12-02 DIAGNOSIS — I2729 Other secondary pulmonary hypertension: Secondary | ICD-10-CM | POA: Diagnosis present

## 2020-12-02 DIAGNOSIS — Z7901 Long term (current) use of anticoagulants: Secondary | ICD-10-CM

## 2020-12-02 DIAGNOSIS — I480 Paroxysmal atrial fibrillation: Secondary | ICD-10-CM | POA: Diagnosis not present

## 2020-12-02 DIAGNOSIS — I5082 Biventricular heart failure: Secondary | ICD-10-CM | POA: Diagnosis present

## 2020-12-02 DIAGNOSIS — I4891 Unspecified atrial fibrillation: Secondary | ICD-10-CM | POA: Diagnosis present

## 2020-12-02 DIAGNOSIS — I517 Cardiomegaly: Secondary | ICD-10-CM | POA: Diagnosis not present

## 2020-12-02 DIAGNOSIS — Z87891 Personal history of nicotine dependence: Secondary | ICD-10-CM | POA: Diagnosis not present

## 2020-12-02 DIAGNOSIS — Z9981 Dependence on supplemental oxygen: Secondary | ICD-10-CM

## 2020-12-02 DIAGNOSIS — I509 Heart failure, unspecified: Secondary | ICD-10-CM | POA: Diagnosis not present

## 2020-12-02 DIAGNOSIS — D509 Iron deficiency anemia, unspecified: Secondary | ICD-10-CM | POA: Diagnosis present

## 2020-12-02 DIAGNOSIS — X58XXXA Exposure to other specified factors, initial encounter: Secondary | ICD-10-CM | POA: Diagnosis present

## 2020-12-02 DIAGNOSIS — Z79899 Other long term (current) drug therapy: Secondary | ICD-10-CM

## 2020-12-02 DIAGNOSIS — I50811 Acute right heart failure: Secondary | ICD-10-CM | POA: Diagnosis not present

## 2020-12-02 DIAGNOSIS — J810 Acute pulmonary edema: Secondary | ICD-10-CM | POA: Diagnosis not present

## 2020-12-02 DIAGNOSIS — R0902 Hypoxemia: Secondary | ICD-10-CM

## 2020-12-02 DIAGNOSIS — Z7984 Long term (current) use of oral hypoglycemic drugs: Secondary | ICD-10-CM

## 2020-12-02 DIAGNOSIS — J9811 Atelectasis: Secondary | ICD-10-CM | POA: Diagnosis present

## 2020-12-02 DIAGNOSIS — G4733 Obstructive sleep apnea (adult) (pediatric): Secondary | ICD-10-CM | POA: Diagnosis not present

## 2020-12-02 DIAGNOSIS — R0602 Shortness of breath: Secondary | ICD-10-CM | POA: Diagnosis not present

## 2020-12-02 DIAGNOSIS — J449 Chronic obstructive pulmonary disease, unspecified: Secondary | ICD-10-CM | POA: Diagnosis present

## 2020-12-02 DIAGNOSIS — J90 Pleural effusion, not elsewhere classified: Secondary | ICD-10-CM | POA: Diagnosis present

## 2020-12-02 DIAGNOSIS — Z833 Family history of diabetes mellitus: Secondary | ICD-10-CM | POA: Diagnosis not present

## 2020-12-02 DIAGNOSIS — R0600 Dyspnea, unspecified: Secondary | ICD-10-CM | POA: Diagnosis not present

## 2020-12-02 DIAGNOSIS — I700 Atherosclerosis of aorta: Secondary | ICD-10-CM | POA: Diagnosis not present

## 2020-12-02 DIAGNOSIS — I5021 Acute systolic (congestive) heart failure: Secondary | ICD-10-CM | POA: Diagnosis present

## 2020-12-02 DIAGNOSIS — I482 Chronic atrial fibrillation, unspecified: Secondary | ICD-10-CM | POA: Diagnosis present

## 2020-12-02 DIAGNOSIS — R590 Localized enlarged lymph nodes: Secondary | ICD-10-CM | POA: Diagnosis not present

## 2020-12-02 LAB — COMPREHENSIVE METABOLIC PANEL
ALT: 19 U/L (ref 0–44)
AST: 34 U/L (ref 15–41)
Albumin: 3.5 g/dL (ref 3.5–5.0)
Alkaline Phosphatase: 66 U/L (ref 38–126)
Anion gap: 17 — ABNORMAL HIGH (ref 5–15)
BUN: 25 mg/dL — ABNORMAL HIGH (ref 8–23)
CO2: 22 mmol/L (ref 22–32)
Calcium: 8.8 mg/dL — ABNORMAL LOW (ref 8.9–10.3)
Chloride: 98 mmol/L (ref 98–111)
Creatinine, Ser: 1.25 mg/dL — ABNORMAL HIGH (ref 0.61–1.24)
GFR, Estimated: >60 mL/min (ref >60)
Glucose, Bld: 240 mg/dL — ABNORMAL HIGH (ref 70–99)
Potassium: 4.1 mmol/L (ref 3.5–5.1)
Sodium: 137 mmol/L (ref 135–145)
Total Bilirubin: 1.1 mg/dL (ref 0.3–1.2)
Total Protein: 7.4 g/dL (ref 6.5–8.1)

## 2020-12-02 LAB — BRAIN NATRIURETIC PEPTIDE: B Natriuretic Peptide: 574.4 pg/mL — ABNORMAL HIGH (ref 0.0–100.0)

## 2020-12-02 LAB — CBC WITH DIFFERENTIAL/PLATELET
Abs Immature Granulocytes: 0.03 10*3/uL (ref 0.00–0.07)
Basophils Absolute: 0 10*3/uL (ref 0.0–0.1)
Basophils Relative: 0 %
Eosinophils Absolute: 0 10*3/uL (ref 0.0–0.5)
Eosinophils Relative: 0 %
HCT: 39.6 % (ref 39.0–52.0)
Hemoglobin: 11.4 g/dL — ABNORMAL LOW (ref 13.0–17.0)
Immature Granulocytes: 0 %
Lymphocytes Relative: 9 %
Lymphs Abs: 0.9 10*3/uL (ref 0.7–4.0)
MCH: 23.3 pg — ABNORMAL LOW (ref 26.0–34.0)
MCHC: 28.8 g/dL — ABNORMAL LOW (ref 30.0–36.0)
MCV: 80.8 fL (ref 80.0–100.0)
Monocytes Absolute: 0.7 10*3/uL (ref 0.1–1.0)
Monocytes Relative: 6 %
Neutro Abs: 8.7 10*3/uL — ABNORMAL HIGH (ref 1.7–7.7)
Neutrophils Relative %: 85 %
Platelets: 371 10*3/uL (ref 150–400)
RBC: 4.9 MIL/uL (ref 4.22–5.81)
RDW: 18.7 % — ABNORMAL HIGH (ref 11.5–15.5)
WBC: 10.4 10*3/uL (ref 4.0–10.5)
nRBC: 0 % (ref 0.0–0.2)

## 2020-12-02 LAB — TROPONIN I (HIGH SENSITIVITY)
Troponin I (High Sensitivity): 27 ng/L — ABNORMAL HIGH (ref <18)
Troponin I (High Sensitivity): 27 ng/L — ABNORMAL HIGH (ref <18)

## 2020-12-02 LAB — LACTIC ACID, PLASMA
Lactic Acid, Venous: 3.3 mmol/L (ref 0.5–1.9)
Lactic Acid, Venous: 1.6 mmol/L (ref 0.5–1.9)

## 2020-12-02 LAB — CBG MONITORING, ED: Glucose-Capillary: 131 mg/dL — ABNORMAL HIGH (ref 70–99)

## 2020-12-02 LAB — RESP PANEL BY RT-PCR (FLU A&B, COVID) ARPGX2
Influenza A by PCR: NEGATIVE
Influenza B by PCR: NEGATIVE
SARS Coronavirus 2 by RT PCR: NEGATIVE

## 2020-12-02 LAB — GLUCOSE, CAPILLARY: Glucose-Capillary: 168 mg/dL — ABNORMAL HIGH (ref 70–99)

## 2020-12-02 MED ORDER — POLYETHYLENE GLYCOL 3350 17 G PO PACK: 17.0000 g | PACK | Freq: Every day | ORAL | Status: DC | PRN

## 2020-12-02 MED ORDER — SODIUM CHLORIDE 0.9% FLUSH
3.0000 mL | Freq: Two times a day (BID) | INTRAVENOUS | Status: DC
  Administered 2020-12-02 – 2020-12-11 (×18): 3 mL via INTRAVENOUS

## 2020-12-02 MED ORDER — OXYCODONE HCL 5 MG PO TABS: 5.0000 mg | ORAL_TABLET | ORAL | Status: DC | PRN

## 2020-12-02 MED ORDER — FUROSEMIDE 10 MG/ML IJ SOLN
60.0000 mg | Freq: Once | INTRAMUSCULAR | Status: AC
  Administered 2020-12-02: 60 mg via INTRAVENOUS
  Filled 2020-12-02: qty 6

## 2020-12-02 MED ORDER — ASPIRIN EC 81 MG PO TBEC
81.0000 mg | DELAYED_RELEASE_TABLET | Freq: Every morning | ORAL | Status: DC
  Administered 2020-12-03 – 2020-12-11 (×9): 81 mg via ORAL
  Filled 2020-12-02 (×8): qty 1

## 2020-12-02 MED ORDER — INSULIN ASPART 100 UNIT/ML IJ SOLN
0.0000 [IU] | Freq: Three times a day (TID) | INTRAMUSCULAR | Status: DC
  Administered 2020-12-03 (×3): 3 [IU] via SUBCUTANEOUS
  Administered 2020-12-04: 2 [IU] via SUBCUTANEOUS
  Administered 2020-12-04 (×2): 3 [IU] via SUBCUTANEOUS
  Administered 2020-12-05: 5 [IU] via SUBCUTANEOUS
  Administered 2020-12-05: 2 [IU] via SUBCUTANEOUS
  Administered 2020-12-05 – 2020-12-06 (×2): 11 [IU] via SUBCUTANEOUS
  Administered 2020-12-06: 3 [IU] via SUBCUTANEOUS
  Administered 2020-12-06: 15 [IU] via SUBCUTANEOUS
  Administered 2020-12-07: 11 [IU] via SUBCUTANEOUS
  Administered 2020-12-07: 5 [IU] via SUBCUTANEOUS
  Administered 2020-12-07: 15 [IU] via SUBCUTANEOUS
  Administered 2020-12-08 (×2): 5 [IU] via SUBCUTANEOUS
  Administered 2020-12-08: 8 [IU] via SUBCUTANEOUS
  Administered 2020-12-09: 5 [IU] via SUBCUTANEOUS
  Administered 2020-12-09 (×2): 11 [IU] via SUBCUTANEOUS
  Administered 2020-12-10: 3 [IU] via SUBCUTANEOUS
  Administered 2020-12-10: 11 [IU] via SUBCUTANEOUS
  Administered 2020-12-10: 5 [IU] via SUBCUTANEOUS
  Administered 2020-12-11: 8 [IU] via SUBCUTANEOUS
  Administered 2020-12-11: 5 [IU] via SUBCUTANEOUS

## 2020-12-02 MED ORDER — ACETAMINOPHEN 650 MG RE SUPP: 650.0000 mg | Freq: Four times a day (QID) | RECTAL | Status: DC | PRN

## 2020-12-02 MED ORDER — UMECLIDINIUM BROMIDE 62.5 MCG/INH IN AEPB
1.0000 | INHALATION_SPRAY | Freq: Every day | RESPIRATORY_TRACT | Status: DC
  Administered 2020-12-03 – 2020-12-11 (×9): 1 via RESPIRATORY_TRACT
  Filled 2020-12-02 (×2): qty 7

## 2020-12-02 MED ORDER — APIXABAN 5 MG PO TABS: 5.0000 mg | ORAL_TABLET | Freq: Two times a day (BID) | ORAL | Status: DC

## 2020-12-02 MED ORDER — AMIODARONE HCL 200 MG PO TABS
200.0000 mg | ORAL_TABLET | Freq: Every day | ORAL | Status: DC
  Administered 2020-12-03 – 2020-12-11 (×9): 200 mg via ORAL
  Filled 2020-12-02 (×9): qty 1

## 2020-12-02 MED ORDER — SILDENAFIL CITRATE 20 MG PO TABS
40.0000 mg | ORAL_TABLET | Freq: Three times a day (TID) | ORAL | Status: DC
  Administered 2020-12-02 – 2020-12-11 (×26): 40 mg via ORAL
  Filled 2020-12-02 (×30): qty 2

## 2020-12-02 MED ORDER — ONDANSETRON HCL 4 MG PO TABS: 4.0000 mg | ORAL_TABLET | Freq: Four times a day (QID) | ORAL | Status: DC | PRN

## 2020-12-02 MED ORDER — SODIUM CHLORIDE 0.9 % IV SOLN
2.0000 g | Freq: Once | INTRAVENOUS | Status: AC
  Administered 2020-12-02: 2 g via INTRAVENOUS
  Filled 2020-12-02: qty 2

## 2020-12-02 MED ORDER — HEPARIN SODIUM (PORCINE) 5000 UNIT/ML IJ SOLN
5000.0000 [IU] | Freq: Three times a day (TID) | INTRAMUSCULAR | Status: DC
  Administered 2020-12-02 – 2020-12-03 (×2): 5000 [IU] via SUBCUTANEOUS
  Filled 2020-12-02 (×2): qty 1

## 2020-12-02 MED ORDER — DILTIAZEM HCL ER COATED BEADS 180 MG PO CP24
180.0000 mg | ORAL_CAPSULE | Freq: Every day | ORAL | Status: DC
  Administered 2020-12-03: 180 mg via ORAL
  Filled 2020-12-02: qty 1

## 2020-12-02 MED ORDER — VANCOMYCIN HCL 1500 MG/300ML IV SOLN
1500.0000 mg | Freq: Once | INTRAVENOUS | Status: DC
  Filled 2020-12-02: qty 300

## 2020-12-02 MED ORDER — ACETAMINOPHEN 325 MG PO TABS
650.0000 mg | ORAL_TABLET | Freq: Four times a day (QID) | ORAL | Status: DC | PRN
  Administered 2020-12-03 – 2020-12-10 (×6): 650 mg via ORAL
  Filled 2020-12-02 (×6): qty 2

## 2020-12-02 MED ORDER — MIDODRINE HCL 5 MG PO TABS
10.0000 mg | ORAL_TABLET | Freq: Three times a day (TID) | ORAL | Status: DC
  Administered 2020-12-02 – 2020-12-11 (×27): 10 mg via ORAL
  Filled 2020-12-02 (×27): qty 2

## 2020-12-02 MED ORDER — DOCUSATE SODIUM 100 MG PO CAPS
100.0000 mg | ORAL_CAPSULE | Freq: Two times a day (BID) | ORAL | Status: DC
  Administered 2020-12-02 – 2020-12-11 (×18): 100 mg via ORAL
  Filled 2020-12-02 (×18): qty 1

## 2020-12-02 MED ORDER — FUROSEMIDE 10 MG/ML IJ SOLN: 40.0000 mg | Freq: Two times a day (BID) | INTRAMUSCULAR | Status: DC

## 2020-12-02 MED ORDER — BISACODYL 5 MG PO TBEC: 5.0000 mg | DELAYED_RELEASE_TABLET | Freq: Every day | ORAL | Status: DC | PRN

## 2020-12-02 MED ORDER — MORPHINE SULFATE (PF) 2 MG/ML IV SOLN: 2.0000 mg | INTRAVENOUS | Status: DC | PRN

## 2020-12-02 MED ORDER — PANTOPRAZOLE SODIUM 40 MG PO TBEC
40.0000 mg | DELAYED_RELEASE_TABLET | Freq: Every day | ORAL | Status: DC
  Administered 2020-12-03 – 2020-12-11 (×9): 40 mg via ORAL
  Filled 2020-12-02 (×9): qty 1

## 2020-12-02 MED ORDER — FLUTICASONE FUROATE-VILANTEROL 200-25 MCG/INH IN AEPB
1.0000 | INHALATION_SPRAY | Freq: Every day | RESPIRATORY_TRACT | Status: DC
  Administered 2020-12-03 – 2020-12-11 (×9): 1 via RESPIRATORY_TRACT
  Filled 2020-12-02: qty 28

## 2020-12-02 MED ORDER — ONDANSETRON HCL 4 MG/2ML IJ SOLN: 4.0000 mg | Freq: Four times a day (QID) | INTRAMUSCULAR | Status: DC | PRN

## 2020-12-02 MED ORDER — INSULIN ASPART 100 UNIT/ML IJ SOLN
0.0000 [IU] | Freq: Every day | INTRAMUSCULAR | Status: DC
  Administered 2020-12-03 – 2020-12-04 (×2): 2 [IU] via SUBCUTANEOUS
  Administered 2020-12-06: 4 [IU] via SUBCUTANEOUS
  Administered 2020-12-08: 5 [IU] via SUBCUTANEOUS
  Administered 2020-12-09: 2 [IU] via SUBCUTANEOUS
  Administered 2020-12-10: 4 [IU] via SUBCUTANEOUS

## 2020-12-02 MED ORDER — BUSPIRONE HCL 10 MG PO TABS
5.0000 mg | ORAL_TABLET | Freq: Two times a day (BID) | ORAL | Status: DC
  Administered 2020-12-02 – 2020-12-11 (×18): 5 mg via ORAL
  Filled 2020-12-02 (×18): qty 1

## 2020-12-02 MED ORDER — ATORVASTATIN CALCIUM 40 MG PO TABS
40.0000 mg | ORAL_TABLET | Freq: Every evening | ORAL | Status: DC
  Administered 2020-12-02 – 2020-12-10 (×9): 40 mg via ORAL
  Filled 2020-12-02 (×7): qty 1

## 2020-12-02 MED ORDER — POLYETHYLENE GLYCOL 3350 17 G PO PACK
17.0000 g | PACK | Freq: Every day | ORAL | Status: DC
  Filled 2020-12-02 (×3): qty 1

## 2020-12-02 NOTE — ED Notes (Signed)
RT notified to switch pt to venturi mask when RT is available.

## 2020-12-02 NOTE — Plan of Care (Signed)

## 2020-12-02 NOTE — ED Notes (Addendum)
Switched to 6LPM  at this time. Pt O2 dropped to 84%. Switched back to non rebreather at this time at 15LPM. O2 increased to 100%.

## 2020-12-02 NOTE — ED Triage Notes (Signed)
Patient coming from home. Complaint of shortness of breath since yesterday. Patient states he felt short of breath yesterday afternoon, slept fine last night on CPAP, but started feeling short of breath again this morning, normally wears 4L [place].

## 2020-12-02 NOTE — ED Provider Notes (Signed)
[facility] EMERGENCY DEPARTMENT Provider Note   CSN: [id number] Arrival date & time: 12/02/20  5027     History Chief Complaint  Patient presents with   Shortness of Breath    Gary [name] is a 70 y.o. male.  HPI Patient presents with dyspnea.  Patient has multiple medical issues including CHF, COPD, chronic respiratory failure requiring 24/7 oxygen.  He notes that since yesterday without obvious precipitant he has had worsening dyspnea.  He was able to sleep somewhat, using CPAP, but with worsening symptoms today called for transport. He denies other concurrent complaints including appreciable pain, nausea, vomiting.  No fever. Per EMS the patient was hypoxic, 80% on his typical 4 L via nasal cannula requiring nonrebreather mask to achieve saturation in the mid 90% range.     Past Medical History:  Diagnosis Date   Accidentally struck by tree 2013   with skull fx, lung contusion, left clavicle Fx, bilateral pelvic Fx.    Diabetes mellitus without complication ([place])    Epistaxis 2014   Secondary to a nasal abnormality, treated and resolved   Hyperlipidemia due to type 2 diabetes mellitus ([place])    Hypertension     Patient Active Problem List   Diagnosis Date Noted   Lung mass 10/15/2020   Acute blood loss anemia 10/15/2020   Chronic respiratory failure ([place]) 10/09/2020   COPD with hypoxia ([place]) 10/09/2020   Hypoalbuminemia due to protein-calorie malnutrition (HCC)    Hyponatremia    Leucocytosis    Hypomagnesemia    Supplemental oxygen dependent    Debility 10/02/2020   ARDS (adult respiratory distress syndrome) (HCC)    Pleural effusion, right    Atypical atrial flutter (HCC)    Acute on chronic right heart failure (HCC)    Acute congestive heart failure ([place]) 09/03/2020   Atrial fibrillation ([place]) 09/03/2020   Acute respiratory failure with hypoxia ([place]) 09/03/2020   Right bundle branch block 09/03/2020   Hyperlipidemia due to type 2  diabetes mellitus [facility])     Past Surgical History:  Procedure Laterality Date   BIOPSY  09/07/2020   Procedure: BIOPSY;  Surgeon: Jackquline Denmark, MD;  Location: [facility] ENDOSCOPY;  Service: Endoscopy;;   ESOPHAGOGASTRODUODENOSCOPY N/A 09/07/2020   Procedure: ESOPHAGOGASTRODUODENOSCOPY (EGD);  Surgeon: Jackquline Denmark, MD;  Location: [facility] ENDOSCOPY;  Service: Endoscopy;  Laterality: N/A;  Bedside in ICU   NOSE SURGERY  2014   PELVIC FRACTURE SURGERY  2013   RIGHT HEART CATH N/A 09/24/2020   Procedure: RIGHT HEART CATH;  Surgeon: Jolaine Artist, MD;  Location: [facility] CV LAB;  Service: Cardiovascular;  Laterality: N/A;   RIGHT/LEFT HEART CATH AND CORONARY ANGIOGRAPHY N/A 09/04/2020   Procedure: RIGHT/LEFT HEART CATH AND CORONARY ANGIOGRAPHY;  Surgeon: Jolaine Artist, MD;  Location: [place] CV LAB;  Service: Cardiovascular;  Laterality: N/A;       Family History  Problem Relation Age of Onset   Diabetes Mother    Alzheimer's disease Mother    Stroke Father    Heart Problems Father        had a pacemaker   Diabetes Brother     Social History   Tobacco Use   Smoking status: Never   Smokeless tobacco: Never  Substance Use Topics   Alcohol use: No   Drug use: No    Home Medications Prior to Admission medications   Medication Sig Start Date End Date Taking? Authorizing Provider  acetaminophen (TYLENOL) 325 MG tablet Take 650  mg by mouth every 6 (six) hours as needed for headache (pain).    [provider]  amiodarone (PACERONE) 200 MG tablet Take 1 tablet (200 mg total) by mouth daily. 10/09/20   Angiulli, Lavon Paganini, PA-C  apixaban (ELIQUIS) 5 MG TABS tablet Take 1 tablet (5 mg total) by mouth 2 (two) times daily. 10/09/20   Angiulli, Lavon Paganini, PA-C  aspirin EC 81 MG tablet Take 81 mg by mouth every morning. Swallow whole.    [provider]  atorvastatin (LIPITOR) 40 MG tablet Take 1 tablet (40 mg total) by mouth every evening. 10/09/20   Angiulli, Lavon Paganini,  PA-C  diltiazem (CARDIZEM CD) 180 MG 24 hr capsule Take 1 capsule (180 mg total) by mouth daily. 10/09/20   Angiulli, Lavon Paganini, PA-C  empagliflozin (JARDIANCE) 10 MG TABS tablet Take 1 tablet (10 mg total) by mouth daily. 10/09/20   Angiulli, Lavon Paganini, PA-C  ferrous sulfate 325 (65 FE) MG tablet Take 1 tablet (325 mg total) by mouth daily with breakfast. 10/09/20   Angiulli, Lavon Paganini, PA-C  fluticasone furoate-vilanterol (BREO ELLIPTA) 200-25 MCG/INH AEPB Inhale 1 puff into the lungs daily. 10/09/20   Angiulli, Lavon Paganini, PA-C  magnesium gluconate (MAGONATE) 500 MG tablet Take 1 tablet (500 mg total) by mouth 2 (two) times daily. 10/09/20   Angiulli, Lavon Paganini, PA-C  metFORMIN (GLUCOPHAGE) 500 MG tablet Take 2 tablets (1,000 mg total) by mouth 2 (two) times daily with a meal. 10/09/20   Angiulli, Lavon Paganini, PA-C  midodrine (PROAMATINE) 10 MG tablet Take 1 tablet (10 mg total) by mouth 3 (three) times daily with meals. 10/09/20   Angiulli, Lavon Paganini, PA-C  Multiple Vitamin (MULTIVITAMIN WITH MINERALS) TABS tablet Take 1 tablet by mouth daily.    [provider]  pantoprazole (PROTONIX) 40 MG tablet Take 1 tablet (40 mg total) by mouth daily. 10/09/20   Angiulli, Lavon Paganini, PA-C  polyethylene glycol (MIRALAX / GLYCOLAX) 17 g packet Take 17 g by mouth daily. 10/10/20   Angiulli, Lavon Paganini, PA-C  potassium chloride SA (KLOR-CON) 20 MEQ tablet Take 1 tablet (20 mEq total) by mouth daily. 10/09/20   Angiulli, Lavon Paganini, PA-C  sildenafil (REVATIO) 20 MG tablet Take 2 tablets (40 mg total) by mouth 3 (three) times daily. 10/09/20   Angiulli, Lavon Paganini, PA-C  torsemide (DEMADEX) 20 MG tablet Take 1 tablet (20 mg total) by mouth daily. 10/09/20   Angiulli, Lavon Paganini, PA-C  umeclidinium bromide (INCRUSE ELLIPTA) 62.5 MCG/INH AEPB Inhale 1 puff into the lungs daily. 10/09/20   Angiulli, Lavon Paganini, PA-C    Allergies    Novocain [procaine]  Review of Systems   Review of Systems  Constitutional:        Per HPI, otherwise  negative  HENT:         Per HPI, otherwise negative  Respiratory:         Per HPI, otherwise negative  Cardiovascular:        Per HPI, otherwise negative  Gastrointestinal:  Negative for vomiting.  Endocrine:       Negative aside from HPI  Genitourinary:        Neg aside from HPI   Musculoskeletal:        Per HPI, otherwise negative  Skin: Negative.   Neurological:  Positive for weakness. Negative for syncope.   Physical Exam Updated Vital Signs BP 111/66   Pulse 65   Temp 98.3 F (36.8 C) (Oral)   Resp Marland Kitchen)  26   SpO2 100%   Physical Exam Vitals and nursing note reviewed.  Constitutional:      Appearance: He is ill-appearing and diaphoretic.  HENT:     Head: Normocephalic and atraumatic.  Eyes:     Conjunctiva/sclera: Conjunctivae normal.  Cardiovascular:     Rate and Rhythm: Normal rate. Rhythm irregular.  Pulmonary:     Effort: Tachypnea present.     Breath sounds: Decreased breath sounds present.  Abdominal:     General: There is no distension.  Musculoskeletal:     Right lower leg: Edema present.     Left lower leg: Edema present.  Skin:    General: Skin is warm.  Neurological:     Mental Status: He is alert and oriented to person, place, and time.    ED Results / Procedures / Treatments   Labs (all labs ordered are listed, but only abnormal results are displayed) Labs Reviewed  COMPREHENSIVE METABOLIC PANEL - Abnormal; Notable for the following components:      Result Value   Glucose, Bld 240 (*)    BUN 25 (*)    Creatinine, Ser 1.25 (*)    Calcium 8.8 (*)    Anion gap 17 (*)    All other components within normal limits  CBC WITH DIFFERENTIAL/PLATELET - Abnormal; Notable for the following components:   Hemoglobin 11.4 (*)    MCH 23.3 (*)    MCHC 28.8 (*)    RDW 18.7 (*)    Neutro Abs 8.7 (*)    All other components within normal limits  BRAIN NATRIURETIC PEPTIDE - Abnormal; Notable for the following components:   B Natriuretic Peptide 574.4  (*)    All other components within normal limits  LACTIC ACID, PLASMA - Abnormal; Notable for the following components:   Lactic Acid, Venous 3.3 (*)    All other components within normal limits  TROPONIN I (HIGH SENSITIVITY) - Abnormal; Notable for the following components:   Troponin I (High Sensitivity) 27 (*)    All other components within normal limits  LACTIC ACID, PLASMA  TROPONIN I (HIGH SENSITIVITY)    EKG EKG Interpretation  Date/Time:  Sunday December 02 2020 10:04:49 EDT Ventricular Rate:  90 PR Interval:    QRS Duration: 142 QT Interval:  416 QTC Calculation: 508 R Axis:   9 Text Interpretation: in a pattern of bigeminy Right bundle branch block T wave abnormality Poor data quality Abnormal ECG Confirmed by Carmin Muskrat [phone]) on 12/02/2020 10:10:44 AM  Radiology CT Chest Wo Contrast  Result Date: 12/02/2020 CLINICAL DATA:  Pleural effusion. EXAM: CT CHEST WITHOUT CONTRAST TECHNIQUE: Multidetector CT imaging of the chest was performed following the standard protocol without IV contrast. COMPARISON:  Sep 20, 2020 FINDINGS: Cardiovascular: Enlarged cardiac silhouette. Calcific atherosclerotic disease of the coronary arteries and aorta. Mediastinum/Nodes: Increasing in mediastinal and right hilar lymphadenopathy. There is narrowing of the right upper and middle lobe bronchi. Lungs/Pleura: Increased in size right pleural effusion which now occupies approximately 50% of the right hemithorax. Previously demonstrated spiculated mass in the anterior aspect of the right middle lobe is obscured by airspace consolidation. Airspace consolidation/atelectasis is also present in most of the right lower lobe. Diffuse hazy airspace opacities persist throughout both lungs. Upper Abdomen: No acute abnormality. Musculoskeletal: No chest wall mass or suspicious bone lesions identified. IMPRESSION: 1. Increased in size right pleural effusion which now occupies approximately 50% of the right  hemithorax. 2. Previously demonstrated spiculated mass in the anterior aspect  of the right middle lobe is obscured by airspace consolidation. 3. Airspace consolidation/atelectasis in most of the right lower lobe and middle lobe. 4. Diffuse hazy airspace opacities persist throughout both lungs. 5. Increasing in mediastinal and right hilar lymphadenopathy. 6. Enlarged cardiac silhouette. 7. Calcific atherosclerotic disease of the coronary arteries and aorta. 8. Aortic atherosclerosis. Aortic Atherosclerosis (ICD10-I70.0). Electronically Signed   By: Fidela Salisbury M.D.   On: 12/02/2020 12:36   DG Chest Port 1 View  Result Date: 12/02/2020 CLINICAL DATA:  Short of breath. EXAM: PORTABLE CHEST 1 VIEW COMPARISON:  Radiograph 09/28/2020 FINDINGS: A stable large cardiac silhouette. There is dense opacification of the RIGHT lower lobe which is new from prior. Central venous congestion is present. No pneumothorax. IMPRESSION: Dense opacification of the RIGHT lower lobe consistent with increased RIGHT pleural effusion. Presumably a component of atelectasis in the RIGHT lower lobe. Cannot exclude RIGHT lower lobe infiltrate Electronically Signed   By: Suzy Bouchard M.D.   On: 12/02/2020 10:53    Procedures Procedures   Medications Ordered in ED Medications  furosemide (LASIX) injection 60 mg (has no administration in time range)  ceFEPIme (MAXIPIME) 1 g in sodium chloride 0.9 % 100 mL IVPB (has no administration in time range)    ED Course  I have reviewed the triage vital signs and the nursing notes.  Pertinent labs & imaging results that were available during my care of the patient were reviewed by me and considered in my medical decision making (see chart for details).  Chart review notable for discharge 2 months ago following presentation with increasing dyspnea.  During that hospitalization patient had CT scan demonstrating spiculated mass right upper lobe as well as pleural  effusions.  Catheterization from earlier this year results below: Dist RCA lesion is 100% stenosed. Ost LAD to Mid LAD lesion is 20% stenosed. Mid LAD lesion is 40% stenosed. Prox RCA lesion is 40% stenosed.   Findings:   Ao = 95/60 (76) LV = 95/14 RA = 12 RV = 60/13 PA = 66/31 (46) PCW = 13 Fick cardiac output/index = 4.1/2.1 PVR = 7.8 WU Ao sat = 99% PA sat =63%, 65%   Assessment: 1. 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 There is a large RV marginal branch that is widely patent 2. EF 60-65% 3. Moderate PAH with elevated PVR and moderately reduced cardiac output   Update:, Initial evaluation notable for pleural effusions bilaterally, possible pneumonia.  CT scan pending. Now, on nonrebreather mask patient has supplemental oxygen at 15 L saturation 95% abnormal Cardiac 60s, A. fib, abnormal  2:03 PM CT consistent with fluid overload, possible pneumonia given the patient's tachypnea, new oxygen requirement, he will receive antibiotics, there is no leukocytosis, no fever, this may be presumptive. Patient's presentation more consistent with worsening fluid status, and he has required IV Lasix given his elevated BNP, gross pulmonary congestion, abnormal CT scan. No evidence for concurrent coronary ischemia, the patient currently has no chest pain.  Low suspicion for PE, patient is on Eliquis. Patient requires admission for further monitoring, management. MDM Rules/Calculators/A&P MDM Number of Diagnoses or Management Options Respiratory distress: new, needed workup   Amount and/or Complexity of Data Reviewed Clinical lab tests: ordered and reviewed Tests in the radiology section of CPT: ordered and reviewed Tests in the medicine section of CPT: reviewed and ordered Decide to obtain previous medical records or to obtain history from someone other than the patient: yes Obtain history  from someone other than the patient:  yes Review and summarize past medical records: yes Discuss the patient with other providers: yes Independent visualization of images, tracings, or specimens: yes  Risk of Complications, Morbidity, and/or Mortality Presenting problems: high Diagnostic procedures: high Management options: high  Critical Care Total time providing critical care: 30-74 minutes (45)  Patient Progress Patient progress: improved   Final Clinical Impression(s) / ED Diagnoses Final diagnoses:  Respiratory distress     Carmin Muskrat, MD 12/02/20 1406

## 2020-12-02 NOTE — ED Notes (Signed)
RN was going to attempt to switch pt to venturi mask. Pt stood up to use the urinal and became very short of breath with sternal accessory muscles used. Pt O2 maintained at 90-91% at this time. Pt remains currently on a nonrebreather until pt feels better. Will continue to monitor.

## 2020-12-02 NOTE — ED Notes (Signed)
Dr.Yates aware that pt HR is currently in the 120s-130s bpm. Pt remains short of breath even with 15LPM high flow [place]. MD aware. Will continue to monitor.
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
## 2020-12-03 ENCOUNTER — Encounter (HOSPITAL_COMMUNITY)
Admission: EM | Disposition: A | Discharge status: Home Health | Payer: Self-pay | Source: Home / Self Care | Attending: Family Medicine

## 2020-12-03 DIAGNOSIS — I50811 Acute right heart failure: Secondary | ICD-10-CM

## 2020-12-03 DIAGNOSIS — J90 Pleural effusion, not elsewhere classified: Secondary | ICD-10-CM

## 2020-12-03 DIAGNOSIS — I509 Heart failure, unspecified: Secondary | ICD-10-CM | POA: Diagnosis not present

## 2020-12-03 DIAGNOSIS — J9622 Acute and chronic respiratory failure with hypercapnia: Secondary | ICD-10-CM | POA: Diagnosis not present

## 2020-12-03 DIAGNOSIS — I4819 Other persistent atrial fibrillation: Secondary | ICD-10-CM | POA: Diagnosis not present

## 2020-12-03 DIAGNOSIS — J9621 Acute and chronic respiratory failure with hypoxia: Secondary | ICD-10-CM | POA: Diagnosis not present

## 2020-12-03 DIAGNOSIS — Z7901 Long term (current) use of anticoagulants: Secondary | ICD-10-CM | POA: Diagnosis not present

## 2020-12-03 DIAGNOSIS — I272 Pulmonary hypertension, unspecified: Secondary | ICD-10-CM

## 2020-12-03 LAB — GLUCOSE, CAPILLARY
Glucose-Capillary: 180 mg/dL — ABNORMAL HIGH (ref 70–99)
Glucose-Capillary: 167 mg/dL — ABNORMAL HIGH (ref 70–99)
Glucose-Capillary: 163 mg/dL — ABNORMAL HIGH (ref 70–99)
Glucose-Capillary: 224 mg/dL — ABNORMAL HIGH (ref 70–99)

## 2020-12-03 LAB — CBC
HCT: 35.5 % — ABNORMAL LOW (ref 39.0–52.0)
Hemoglobin: 10.6 g/dL — ABNORMAL LOW (ref 13.0–17.0)
MCH: 23.5 pg — ABNORMAL LOW (ref 26.0–34.0)
MCHC: 29.9 g/dL — ABNORMAL LOW (ref 30.0–36.0)
MCV: 78.5 fL — ABNORMAL LOW (ref 80.0–100.0)
Platelets: 358 10*3/uL (ref 150–400)
RBC: 4.52 MIL/uL (ref 4.22–5.81)
RDW: 18.2 % — ABNORMAL HIGH (ref 11.5–15.5)
WBC: 9.3 10*3/uL (ref 4.0–10.5)
nRBC: 0 % (ref 0.0–0.2)

## 2020-12-03 LAB — BASIC METABOLIC PANEL
Anion gap: 12 (ref 5–15)
BUN: 22 mg/dL (ref 8–23)
CO2: 28 mmol/L (ref 22–32)
Calcium: 8.5 mg/dL — ABNORMAL LOW (ref 8.9–10.3)
Chloride: 101 mmol/L (ref 98–111)
Creatinine, Ser: 1.17 mg/dL (ref 0.61–1.24)
GFR, Estimated: >60 mL/min (ref >60)
Glucose, Bld: 119 mg/dL — ABNORMAL HIGH (ref 70–99)
Potassium: 3.7 mmol/L (ref 3.5–5.1)
Sodium: 141 mmol/L (ref 135–145)

## 2020-12-03 SURGERY — CANCELLED PROCEDURE
Laterality: Right

## 2020-12-03 MED ORDER — FERROUS SULFATE 325 (65 FE) MG PO TABS
325.0000 mg | ORAL_TABLET | Freq: Every day | ORAL | Status: DC
  Administered 2020-12-04 – 2020-12-11 (×8): 325 mg via ORAL
  Filled 2020-12-03 (×8): qty 1

## 2020-12-03 MED ORDER — FUROSEMIDE 10 MG/ML IJ SOLN
40.0000 mg | Freq: Two times a day (BID) | INTRAMUSCULAR | Status: DC
  Administered 2020-12-04 – 2020-12-06 (×5): 40 mg via INTRAVENOUS
  Filled 2020-12-03 (×5): qty 4

## 2020-12-03 MED ORDER — ENSURE ENLIVE PO LIQD
237.0000 mL | Freq: Two times a day (BID) | ORAL | Status: DC
  Administered 2020-12-03 – 2020-12-07 (×4): 237 mL via ORAL
  Filled 2020-12-03: qty 237

## 2020-12-03 MED ORDER — FUROSEMIDE 10 MG/ML IJ SOLN
60.0000 mg | Freq: Once | INTRAMUSCULAR | Status: AC
  Administered 2020-12-03: 60 mg via INTRAVENOUS
  Filled 2020-12-03: qty 6

## 2020-12-03 MED ORDER — FUROSEMIDE 10 MG/ML IJ SOLN: 80.0000 mg | Freq: Once | INTRAMUSCULAR | Status: DC

## 2020-12-03 MED ORDER — ALPRAZOLAM 0.5 MG PO TABS
0.5000 mg | ORAL_TABLET | Freq: Three times a day (TID) | ORAL | Status: DC | PRN
  Administered 2020-12-03 – 2020-12-04 (×2): 0.5 mg via ORAL
  Filled 2020-12-03 (×2): qty 1

## 2020-12-03 MED ORDER — SODIUM CHLORIDE 0.9 % IV SOLN
2.0000 g | Freq: Two times a day (BID) | INTRAVENOUS | Status: DC
  Administered 2020-12-03 (×2): 2 g via INTRAVENOUS
  Filled 2020-12-03 (×3): qty 2

## 2020-12-03 MED ORDER — HEPARIN BOLUS VIA INFUSION
3000.0000 [IU] | Freq: Once | INTRAVENOUS | Status: AC
  Administered 2020-12-03: 3000 [IU] via INTRAVENOUS
  Filled 2020-12-03: qty 3000

## 2020-12-03 MED ORDER — LACTATED RINGERS IV BOLUS
1000.0000 mL | Freq: Once | INTRAVENOUS | Status: AC
  Administered 2020-12-03: 1000 mL via INTRAVENOUS

## 2020-12-03 MED ORDER — SALINE SPRAY 0.65 % NA SOLN
1.0000 | NASAL | Status: DC | PRN
  Filled 2020-12-03: qty 44

## 2020-12-03 MED ORDER — HEPARIN (PORCINE) 25000 UT/250ML-% IV SOLN
1200.0000 [IU]/h | INTRAVENOUS | Status: DC
  Administered 2020-12-03: 1200 [IU]/h via INTRAVENOUS
  Filled 2020-12-03: qty 250

## 2020-12-03 MED ORDER — DILTIAZEM HCL-DEXTROSE 125-5 MG/125ML-% IV SOLN (PREMIX)
5.0000 mg/h | INTRAVENOUS | Status: DC
  Administered 2020-12-03: 5 mg/h via INTRAVENOUS
  Filled 2020-12-03 (×3): qty 125

## 2020-12-03 NOTE — Evaluation (Signed)
Physical Therapy Evaluation Patient Details Name: Gary [name] MRN: [id number] DOB: 1951/04/07 Today's Date: 12/03/2020   History of Present Illness  70 y.o. male presenting to ED 12/02/20 with SOB. Patient admitted with acute on chronic respiratory failure with hypoxia and acute on chronic R-sided heart failure. Previous admission 4/18-5/17 for acute respiratory failure with hypoxia associated with PAH with cor pulmonale. Then went to [facility] 5/17-5/25/22.  PMHx significant for DM, CHF, lung mass, HTN, CHF, and COPD on home O2.  Clinical Impression  Pt was able to get up a second time OOB to chair with PT.  Wife present throughout and insightful re: pt's PLOF.  Min assist overall for transfer, but that is about all he could handle.  He sat and recovered for ~5 mins before we could start seated HEP and even then we needed regular rest breaks to regain his breathing.  O2 sats were as low as 76% on 15 L HFNC and as high as 90%.  Per wife's report, he normally hovers around 88% on 6 L O2 [place] at home.   PT to follow acutely for deficits listed below.       Follow Up Recommendations Home health PT    Equipment Recommendations  None recommended by PT    Recommendations for Other Services       Precautions / Restrictions Precautions Precautions: Fall Precaution Comments: Monitor HR and SpO2      Mobility  Bed Mobility Overal bed mobility: Needs Assistance Bed Mobility: Supine to Sit     Supine to sit: Supervision     General bed mobility comments: HOB elevated and used rail    Transfers Overall transfer level: Needs assistance Equipment used: 1 person hand held assist Transfers: Sit to/from [facility] Sit to Stand: Min assist Stand pivot transfers: Min assist       General transfer comment: Min assist due to bil knee stiffness with 2-3 pivotal steps to the chair right next to the bed.  Respiratory state would not let us walk further.  Ambulation/Gait              General Gait Details: unable due to increased WOB with just OOB to chair.  Pt on HFNC 15L and O2 sats dropped to 76% fluctuating from the mid to upper 80s.  Wife reports 88% is where he mostly lives at home, but he is only on 6L O2 [place] normally.  Stairs            Wheelchair Mobility    Modified Rankin (Stroke Patients Only)       Balance Overall balance assessment: Needs assistance Sitting-balance support: Feet supported;No upper extremity supported Sitting balance-Leahy Scale: Good     Standing balance support: Bilateral upper extremity supported;Single extremity supported Standing balance-Leahy Scale: Poor Standing balance comment: needs external support in standing.                             Pertinent Vitals/Pain Pain Assessment: Faces Faces Pain Scale: Hurts even more Pain Location: neck and bil knee stiffness Pain Descriptors / Indicators: Grimacing;Guarding Pain Intervention(s): Limited activity within patient's tolerance;Monitored during session;Repositioned;Heat applied (heat applied to neck)    Home Living Family/patient expects to be discharged to:: Private residence Living Arrangements: Spouse/significant other Available Help at Discharge: Family;Available PRN/intermittently Type of Home: House Home Access: Stairs to enter;Ramped entrance;Other (comment) (ramped entrance connected to back porch) Entrance Stairs-Rails: None Entrance Stairs-Number of Steps: 2  Home Layout: One level Home Equipment: Walker - 2 wheels;Cane - quad;Shower seat;Grab bars - tub/shower;Other (comment) (Adjustable matress, home O2)      Prior Function Level of Independence: Needs assistance   Gait / Transfers Assistance Needed: Reports use of RW vs SPC for household ambulation  ADL's / Homemaking Assistance Needed: Wife provides supervision A for bathing at shower level; reports providing PRN supervision A for toilet transfers        Hand Dominance    Dominant Hand: Right    Extremity/Trunk Assessment   Upper Extremity Assessment Upper Extremity Assessment: Defer to OT evaluation    Lower Extremity Assessment Lower Extremity Assessment: RLE deficits/detail;LLE deficits/detail RLE Deficits / Details: 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  Grossly 3/5 per fuctionall OOB to chair assessment. 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  Grossly 3/5 per fuctionall OOB to chair assessment.    Cervical / Trunk Assessment Cervical / Trunk Assessment: Kyphotic;Other exceptions Cervical / Trunk Exceptions: very guarded in bil UT bil shoulder elevation with increased resp distress, likely causing some of the neck pain/stiffness  Communication   Communication: No difficulties  Cognition Arousal/Alertness: Awake/alert Behavior During Therapy: WFL for tasks assessed/performed Overall Cognitive Status: Within Functional Limits for tasks assessed                                        General Comments      Exercises General Exercises - Lower Extremity Ankle Circles/Pumps: AROM;Both;20 reps Long Arc Quad: AROM;Both;15 reps Hip Flexion/Marching: AROM;Both;10 reps   Assessment/Plan    PT Assessment Patient needs continued PT services  PT Problem List Decreased strength;Decreased activity tolerance;Decreased balance;Decreased mobility;Decreased knowledge of use of DME;Cardiopulmonary status limiting activity       PT Treatment Interventions DME instruction;Gait training;Stair training;Functional mobility training;Therapeutic activities;Therapeutic exercise;Balance training;Patient/family education    PT Goals (Current goals can be found in the Care Plan section)  Acute Rehab PT Goals Patient Stated Goal: To return home. PT Goal Formulation: With patient/family Time For Goal Achievement: 12/17/20 Potential to Achieve Goals: Good     Frequency Min 3X/week   Barriers to discharge        Co-evaluation               AM-PAC PT "6 Clicks" Mobility  Outcome Measure Help needed turning from your back to your side while in a flat bed without using bedrails?: A Little Help needed moving from lying on your back to sitting on the side of a flat bed without using bedrails?: A Little Help needed moving to and from a bed to a chair (including a wheelchair)?: A Little Help needed standing up from a chair using your arms (e.g., wheelchair or bedside chair)?: A Little Help needed to walk in hospital room?: A Lot Help needed climbing 3-5 steps with a railing? : A Lot 6 Click Score: 16    End of Session Equipment Utilized During Treatment: Oxygen Activity Tolerance: Other (comment) (limited by DOE) Patient left: in chair;with call bell/phone within reach;with family/visitor present   PT Visit Diagnosis: Muscle weakness (generalized) (M62.81);Difficulty in walking, not elsewhere classified (R26.2)    Time: 9163-8466 PT Time Calculation (min) (ACUTE ONLY): 33 min   Charges:   PT Evaluation $PT Eval Moderate Complexity: 1 Mod PT Treatments $  Therapeutic Exercise: 8-22 mins        Verdene Lennert, PT, DPT  Acute Rehabilitation Ortho Tech Supervisor [phone] pager [phone] office

## 2020-12-03 NOTE — Progress Notes (Signed)
PROGRESS NOTE    Gary [name]  VHQ:[id number] DOB: Jul 23, 1950 DOA: 12/02/2020 PCP: Jalene Mullet, PA-C   Chief Complain: Shortness of breath  Brief Narrative: Patient is a 70 year old male with history of diabetes, hypertension, hyperlipidemia, congestive heart failure, COPD on home oxygen/trilogy machine who presented with shortness of breath to the emergency department.  He was recently admitted here from 4/18-5/17 for acute respiratory failure with hypoxia associated with pulmonary hypertension/cor pulmonale and was discharged on home oxygen.  He uses 4 to 5 L of oxygen at home.  On his last hospitalization, hospital course was complicated with upper GI bleed and was intubated to undergo EGD which showed 2 bleeding ulcers, he was also transfused with 3 units of PRBC at the time.  He was also started on anticoagulation for new onset A. fib. CT chest done on this admission showed right pleural effusion, a spiculated mass on the right side, airspace consolidation.  Started on antibiotics.  PCCM consulted, plan is for thoracentesis today.  Still on high flow oxygen.  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  Uses trilogy machine at home at night.  After his last hospitalization, he was discharged on 4-5 L of oxygen per minute. CT of the chest showed moderate right pleural effusion, apparently right middle lobe mass, right airspace consolidation. PCCM planning for thoracentesis today.  Continue cefepime for now for possible superimposed pneumonia. He was on high flow oxygen this morning.  Right lung mass: Patient said  he was diagnosed with right lung mass in 2014 and was in surveillance.  PCCM following, may need bronc.  Needs to follow-up pleural fluid cytology  A. fib with RVR:- He was on oral  amiodarone, oral Cardizem at home.  Started on IV Cardizem today for persistent A. fib with RVR.  Cardiology also consulted.  Eliquis on hold for further procedures, ordered heparin drip  Acute on chronic right-sided heart failure: Extensive hospitalization recently for cardiac issues, had cath x2.  Takes Demadex at home.  Given Lasix 40 mg IV once in the emergency department.  On aspirin, sildenafil Cardiology consulted. Elevated BNP  Hypotension: On midodrine at home.  Continued  Hyperlipidemia: On Lipitor  Diabetes type 2: Jardiance, Glucophage at home.  Recent hemoglobin C of 5.2.  Currently on sliding scale insulin.          DVT prophylaxis:Eliquis Code Status: Full Family Communication: None at bedside Status is: Inpatient  Remains inpatient appropriate because:Hemodynamically unstable  Dispo: The patient is from: Home              Anticipated d/c is to: Home              Patient currently is not medically stable to d/c.   Difficult to place patient No     Consultants: PCCM,cardiogy  Procedures:None yet  Antimicrobials:  Anti-infectives (From admission, onward)    Start     Dose/Rate Route Frequency Ordered Stop   12/03/20 1130  ceFEPIme (MAXIPIME) 2 g in sodium chloride 0.9 % 100 mL IVPB        2 g 200 mL/hr over 30 Minutes Intravenous  Every 12 hours 12/03/20 1037     12/02/20 1600  vancomycin (VANCOREADY) IVPB 1500 mg/300 mL  Status:  Discontinued        1,500 mg 150 mL/hr over 120 Minutes Intravenous  Once 12/02/20 1524 12/02/20 1633   12/02/20 1415  ceFEPIme (MAXIPIME) 2 g in sodium chloride 0.9 % 100 mL IVPB        2 g 200 mL/hr over 30 Minutes Intravenous  Once 12/02/20 1402 12/02/20 1622       Subjective:  Patient seen and examined the bedside this morning.  On A. fib  with RVR during my evaluation.  On high flow oxygen.  He was sitting on the bed, looks in mild to moderate respiratory distress.   Objective: Vitals:   12/02/20 1906 12/02/20 2300 12/03/20 0300 12/03/20 0849  BP: 117/73 117/79 (!) 114/55 109/72  Pulse: (!) 133 (!) 110 92 (!) 131  Resp: (!) 36 (!) 24 (!) 29 (!) 32  Temp: 97.9 F (36.6 C) 97.9 F (36.6 C) 97.9 F (36.6 C) 98.3 F (36.8 C)  TempSrc: Oral Oral Axillary Oral  SpO2: 95% 90% 100% 95%  Weight: 79.7 kg     Height: 5' 7"  (1.702 m)       Intake/Output Summary (Last 24 hours) at 12/03/2020 1319 Last data filed at 12/02/2020 1700 Gross per 24 hour  Intake 100 ml  Output 1900 ml  Net -1800 ml   Filed Weights   12/02/20 1522 12/02/20 1906  Weight: 81.3 kg 79.7 kg    Examination:  General exam: Deconditioned, chronically ill looking, dyspneic  Respiratory system: Diminished air sound on the right side  cardiovascular system: Irregularly irregular rhythm  gastrointestinal system: Abdomen is nondistended, soft and nontender. No organomegaly or masses felt. Normal bowel sounds heard. Central nervous system: Alert and oriented. No focal neurological deficits. Extremities: No edema, no clubbing ,no cyanosis Skin: No rashes, lesions or ulcers,no icterus ,no pallor MSK: Normal muscle bulk,tone ,power Psychiatry: Judgement and insight appear normal. Mood & affect appropriate.     Data Reviewed: I have personally reviewed following labs and imaging studies  CBC: Recent Labs  Lab 12/02/20 1018 12/03/20 0451  WBC 10.4 9.3  NEUTROABS 8.7*  --   HGB 11.4* 10.6*  HCT 39.6 35.5*  MCV 80.8 78.5*  PLT 371 751   Basic Metabolic Panel: Recent Labs  Lab 12/02/20 1018 12/03/20 0451  NA 137 141  K 4.1 3.7  CL 98 101  CO2 22 28  GLUCOSE 240* 119*  BUN 25* 22  CREATININE 1.25* 1.17  CALCIUM 8.8* 8.5*   GFR: Estimated Creatinine Clearance: 57.7 mL/min (by C-G formula based on SCr of 1.17 mg/dL). Liver Function  Tests: Recent Labs  Lab 12/02/20 1018  AST 34  ALT 19  ALKPHOS 66  BILITOT 1.1  PROT 7.4  ALBUMIN 3.5   No results for input(s): LIPASE, AMYLASE in the last 168 hours. No results for input(s): AMMONIA in the last 168 hours. Coagulation Profile: No results for input(s): INR, PROTIME in the last 168 hours. Cardiac Enzymes: No results for input(s): CKTOTAL, CKMB, CKMBINDEX, TROPONINI in the last 168 hours. BNP (last 3 results) No results for input(s): PROBNP in the last 8760 hours. HbA1C: No results for input(s): HGBA1C in the last 72 hours. CBG: Recent Labs  Lab 12/02/20 1656 12/02/20 2211 12/03/20 0850 12/03/20 1146  GLUCAP 131* 168* 180* 167*   Lipid Profile: No results for input(s): CHOL, HDL, LDLCALC, TRIG, CHOLHDL, LDLDIRECT in the  last 72 hours. Thyroid Function Tests: No results for input(s): TSH, T4TOTAL, FREET4, T3FREE, THYROIDAB in the last 72 hours. Anemia Panel: No results for input(s): VITAMINB12, FOLATE, FERRITIN, TIBC, IRON, RETICCTPCT in the last 72 hours. Sepsis Labs: Recent Labs  Lab 12/02/20 1018 12/02/20 1228  LATICACIDVEN 3.3* 1.6    Recent Results (from the past 240 hour(s))  Resp Panel by RT-PCR (Flu A&B, Covid) Nasopharyngeal Swab     Status: None   Collection Time: 12/02/20 10:18 AM   Specimen: Nasopharyngeal Swab; Nasopharyngeal(NP) swabs in vial transport medium  Result Value Ref Range Status   SARS Coronavirus 2 by RT PCR NEGATIVE NEGATIVE Final    Comment: (NOTE) SARS-CoV-2 target nucleic acids are NOT DETECTED.  The SARS-CoV-2 RNA is generally detectable in upper respiratory specimens during the acute phase of infection. The lowest concentration of SARS-CoV-2 viral copies this assay can detect is 138 copies/mL. A negative result does not preclude SARS-Cov-2 infection and should not be used as the sole basis for treatment or other patient management decisions. A negative result may occur with  improper specimen collection/handling,  submission of specimen other than nasopharyngeal swab, presence of viral mutation(s) within the areas targeted by this assay, and inadequate number of viral copies(<138 copies/mL). A negative result must be combined with clinical observations, patient history, and epidemiological information. The expected result is Negative.  Fact Sheet for Patients:  EntrepreneurPulse.com.au  Fact Sheet for Healthcare Providers:  IncredibleEmployment.be  This test is no t yet approved or cleared by the [place] FDA and  has been authorized for detection and/or diagnosis of SARS-CoV-2 by FDA under an Emergency Use Authorization (EUA). This EUA will remain  in effect (meaning this test can be used) for the duration of the COVID-19 declaration under Section 564(b)(1) of the Act, 21 U.S.C.section 360bbb-3(b)(1), unless the authorization is terminated  or revoked sooner.       Influenza A by PCR NEGATIVE NEGATIVE Final   Influenza B by PCR NEGATIVE NEGATIVE Final    Comment: (NOTE) The Xpert Xpress SARS-CoV-2/FLU/RSV plus assay is intended as an aid in the diagnosis of influenza from Nasopharyngeal swab specimens and should not be used as a sole basis for treatment. Nasal washings and aspirates are unacceptable for Xpert Xpress SARS-CoV-2/FLU/RSV testing.  Fact Sheet for Patients: EntrepreneurPulse.com.au  Fact Sheet for Healthcare Providers: IncredibleEmployment.be  This test is not yet approved or cleared by the [place] FDA and has been authorized for detection and/or diagnosis of SARS-CoV-2 by FDA under an Emergency Use Authorization (EUA). This EUA will remain in effect (meaning this test can be used) for the duration of the COVID-19 declaration under Section 564(b)(1) of the Act, 21 U.S.C. section 360bbb-3(b)(1), unless the authorization is terminated or revoked.  Performed at [facility], [place] [address], [place], [place] [address]          Radiology Studies: CT Chest Wo Contrast  Result Date: 12/02/2020 CLINICAL DATA:  Pleural effusion. EXAM: CT CHEST WITHOUT CONTRAST TECHNIQUE: Multidetector CT imaging of the chest was performed following the standard protocol without IV contrast. COMPARISON:  Sep 20, 2020 FINDINGS: Cardiovascular: Enlarged cardiac silhouette. Calcific atherosclerotic disease of the coronary arteries and aorta. Mediastinum/Nodes: Increasing in mediastinal and right hilar lymphadenopathy. There is narrowing of the right upper and middle lobe bronchi. Lungs/Pleura: Increased in size right pleural effusion which now occupies approximately 50% of the right hemithorax. Previously demonstrated spiculated mass in the anterior aspect of the right middle lobe is obscured  by airspace consolidation. Airspace consolidation/atelectasis is also present in most of the right lower lobe. Diffuse hazy airspace opacities persist throughout both lungs. Upper Abdomen: No acute abnormality. Musculoskeletal: No chest wall mass or suspicious bone lesions identified. IMPRESSION: 1. Increased in size right pleural effusion which now occupies approximately 50% of the right hemithorax. 2. Previously demonstrated spiculated mass in the anterior aspect of the right middle lobe is obscured by airspace consolidation. 3. Airspace consolidation/atelectasis in most of the right lower lobe and middle lobe. 4. Diffuse hazy airspace opacities persist throughout both lungs. 5. Increasing in mediastinal and right hilar lymphadenopathy. 6. Enlarged cardiac silhouette. 7. Calcific atherosclerotic disease of the coronary arteries and aorta. 8. Aortic atherosclerosis. Aortic Atherosclerosis (ICD10-I70.0). Electronically Signed   By: Fidela Salisbury M.D.   On: 12/02/2020 12:36   DG Chest Port 1 View  Result Date: 12/02/2020 CLINICAL DATA:  Short of breath. EXAM: PORTABLE CHEST 1 VIEW COMPARISON:  Radiograph 09/28/2020  FINDINGS: A stable large cardiac silhouette. There is dense opacification of the RIGHT lower lobe which is new from prior. Central venous congestion is present. No pneumothorax. IMPRESSION: Dense opacification of the RIGHT lower lobe consistent with increased RIGHT pleural effusion. Presumably a component of atelectasis in the RIGHT lower lobe. Cannot exclude RIGHT lower lobe infiltrate Electronically Signed   By: Suzy Bouchard M.D.   On: 12/02/2020 10:53        Scheduled Meds:  amiodarone  200 mg Oral Daily   aspirin EC  81 mg Oral q morning   atorvastatin  40 mg Oral QPM   busPIRone  5 mg Oral BID   docusate sodium  100 mg Oral BID   fluticasone furoate-vilanterol  1 puff Inhalation Daily   insulin aspart  0-15 Units Subcutaneous TID WC   insulin aspart  0-5 Units Subcutaneous QHS   midodrine  10 mg Oral TID WC   pantoprazole  40 mg Oral Daily   polyethylene glycol  17 g Oral Daily   sildenafil  40 mg Oral TID   sodium chloride flush  3 mL Intravenous Q12H   umeclidinium bromide  1 puff Inhalation Daily   Continuous Infusions:  ceFEPime (MAXIPIME) IV 2 g (12/03/20 1256)   diltiazem (CARDIZEM) infusion       LOS: 1 day    Time spent: 35 mins.More than 50% of that time was spent in counseling and/or coordination of care.      Shelly Coss, MD [facility] P7/18/2022, 1:19 PM

## 2020-12-03 NOTE — Consult Note (Addendum)
Cardiology Consultation:   Patient ID: Gary [name] MRN: [id number]; DOB: 02/12/1951  Admit date: 12/02/2020 Date of Consult: 12/03/2020  PCP:  Jalene Mullet, PA-C   [place] Providers Cardiologist:  Elouise Munroe, MD        Patient Profile:   Gary [name] is a 70 y.o. male with a PMH of CAD, paroxysmal atrial fibrillation, right heart failure, PAH with cor pulmonale, COPD, HTN, HLD, DM type 2, and GI bleed, who is being seen 12/03/2020 for the evaluation of atrial fibrillation at the request of Dr. Tawanna Solo.  History of Present Illness:   Gary [name] had a lengthy admission April-May 2022 after presenting with acute respiratory failure felt to be 2/2 pulmHTN with cor pulmonale. Cardiology and advanced heart failure followed throughout his admission. His hospital course was complicated by new onset atrial fibrillation for which he was started on anticoagulation. He subsequently had a GI bleed requiring 3 uPRBC. EGD showed two non-bleeding duodenal ulcers and he was started on PPI and triple therapy for H.pylori infection. Eliquis restarted without complications. Echocardiogram that admission showed EF 55-60%, mild concentric LVH, moderate RAE, severely reduced RVSF with severe enlargement, moderately elevated PA pressures (59.4 mmHg), mild MR, and moderate TR. He underwent a R/LHC which showed 100% dRCA stenosis which appeared chronic, otherwise 20% ostial-mid LAD stenosis, 40% mLAD stenosis, and 40% pRCA stenosis which were medically managed, in addition to moderate PAH with elevated PVR and moderately reduced cardiac output. Subsequent RHC showed very mild PAH with normal-high cardiac output. Bubble study showed no evidence of right to left shunting at rest or with valsalva maneuver. He was ultimately discharged home on sildenafil for his PAH with midodrine TID for BP support. He was recommended to undergo PFTs once better compensated and an outpatient sleep study. He was  discharged home on amiodarone 464m daily, diltiazem 1264mdaily, and eliquis 56m7mID for management of his atrial fibrillation. He was recommended to consider DCCV outpatient should he remain in atrial fibrillation with consideration for Watchman LAA closure. He has not been followed by cardiology since that time. Scheduled to see Dr. BraHarl Bowie9/22.  He presented to the ED 12/02/20 with complaints of SOB. He woke up the morning of 12/02/20 feeling systemically ill, "like I was hit by a truck". He had not noticed any change in his chronic cough nor had he had any known sick exposures. He was noted to be hypoxic to 80% prompting wife to bring him to the ED for further evaluation. In hindsight his wife noticed his diuretic was not as effective over the past week. She also noticed he had some pedal edema this past week which was not resolved upon waking in the morning.   Hospital course: afebrile, intermittently tachypneic, hypoxic placed on HFNC>NRB>HFNC, initially regular HR then elevated to the 130s, and soft but stable blood pressures. Labs notable for electrolytes wnl, Cr 1.25>1.1 (baseline 0.9>1.1, Hgb 11.4, PLT 371, lactate 3.3>1.6, BNP 574, HsTrop 27>27. EKG is difficult to interpret due to artifact/poor quality. CXR showed dense opacifications of the RLL c/w increase right pleural effusion (likely atelectasis but cannot r/o infiltrate). CT Chest showed increased right pleural effusion, diffuse hazy airspace opacities, increased mediastinal and right hilar lymphadenopathy, and ongoing RML spiculated mass. He was started on IV antibiotics and given IV lasix in the ED. Admitted to medicine. PCCM consulted and patient planned for thoracentesis tomorrow. Cardiology asked to evaluate for atrial fibrillation with RVR and management of diuresis.  Past Medical History:  Diagnosis Date   Accidentally struck by tree 2013   with skull fx, lung contusion, left clavicle Fx, bilateral pelvic Fx.    Atrial  fibrillation ([place]) 09/2020   on Eliquis   Diabetes mellitus without complication ([place])    Epistaxis 2014   Secondary to a nasal abnormality, treated and resolved   Hyperlipidemia due to type 2 diabetes mellitus ([place])    Hypertension     Past Surgical History:  Procedure Laterality Date   BIOPSY  09/07/2020   Procedure: BIOPSY;  Surgeon: Jackquline Denmark, MD;  Location: [facility] ENDOSCOPY;  Service: Endoscopy;;   ESOPHAGOGASTRODUODENOSCOPY N/A 09/07/2020   Procedure: ESOPHAGOGASTRODUODENOSCOPY (EGD);  Surgeon: Jackquline Denmark, MD;  Location: [facility] ENDOSCOPY;  Service: Endoscopy;  Laterality: N/A;  Bedside in ICU   NOSE SURGERY  2014   PELVIC FRACTURE SURGERY  2013   RIGHT HEART CATH N/A 09/24/2020   Procedure: RIGHT HEART CATH;  Surgeon: Jolaine Artist, MD;  Location: [place] CV LAB;  Service: Cardiovascular;  Laterality: N/A;   RIGHT/LEFT HEART CATH AND CORONARY ANGIOGRAPHY N/A 09/04/2020   Procedure: RIGHT/LEFT HEART CATH AND CORONARY ANGIOGRAPHY;  Surgeon: Jolaine Artist, MD;  Location: [place] CV LAB;  Service: Cardiovascular;  Laterality: N/A;     Home Medications:  Prior to Admission medications   Medication Sig Start Date End Date Taking? Authorizing Provider  acetaminophen (TYLENOL) 325 MG tablet Take 650 mg by mouth every 6 (six) hours as needed for headache (pain).   Yes [provider]  amiodarone (PACERONE) 200 MG tablet Take 1 tablet (200 mg total) by mouth daily. 10/09/20  Yes Angiulli, Lavon Paganini, PA-C  apixaban (ELIQUIS) 5 MG TABS tablet Take 1 tablet (5 mg total) by mouth 2 (two) times daily. 10/09/20  Yes Angiulli, Lavon Paganini, PA-C  aspirin EC 81 MG tablet Take 81 mg by mouth every morning. Swallow whole.   Yes [provider]  atorvastatin (LIPITOR) 40 MG tablet Take 1 tablet (40 mg total) by mouth every evening. 10/09/20  Yes Angiulli, Lavon Paganini, PA-C  busPIRone (BUSPAR) 15 MG tablet Take 7.5 mg by mouth 2 (two) times daily. 11/27/20  Yes [provider]  diltiazem (CARDIZEM CD) 180 MG 24 hr capsule Take 1 capsule (180 mg total) by mouth daily. 10/09/20  Yes Angiulli, Lavon Paganini, PA-C  empagliflozin (JARDIANCE) 10 MG TABS tablet Take 1 tablet (10 mg total) by mouth daily. 10/09/20  Yes Angiulli, Lavon Paganini, PA-C  Ferrous Sulfate (IRON PO) Take 1 tablet by mouth daily.   Yes [provider]  fluticasone furoate-vilanterol (BREO ELLIPTA) 200-25 MCG/INH AEPB Inhale 1 puff into the lungs daily. 10/09/20  Yes Angiulli, Lavon Paganini, PA-C  magnesium gluconate (MAGONATE) 500 MG tablet Take 1 tablet (500 mg total) by mouth 2 (two) times daily. 10/09/20  Yes Angiulli, Lavon Paganini, PA-C  metFORMIN (GLUCOPHAGE) 500 MG tablet Take 2 tablets (1,000 mg total) by mouth 2 (two) times daily with a meal. 10/09/20  Yes Angiulli, Lavon Paganini, PA-C  midodrine (PROAMATINE) 10 MG tablet Take 1 tablet (10 mg total) by mouth 3 (three) times daily with meals. 10/09/20  Yes Angiulli, Lavon Paganini, PA-C  Multiple Vitamin (MULTIVITAMIN WITH MINERALS) TABS tablet Take 1 tablet by mouth daily.   Yes [provider]  pantoprazole (PROTONIX) 40 MG tablet Take 1 tablet (40 mg total) by mouth daily. 10/09/20  Yes Angiulli, Lavon Paganini, PA-C  polyethylene glycol (MIRALAX / GLYCOLAX) 17 g packet Take 17 g  by mouth daily. 10/10/20  Yes Angiulli, Lavon Paganini, PA-C  potassium chloride SA (KLOR-CON) 20 MEQ tablet Take 1 tablet (20 mEq total) by mouth daily. 10/09/20  Yes Angiulli, Lavon Paganini, PA-C  sildenafil (REVATIO) 20 MG tablet Take 2 tablets (40 mg total) by mouth 3 (three) times daily. 10/09/20  Yes Angiulli, Lavon Paganini, PA-C  torsemide (DEMADEX) 20 MG tablet Take 1 tablet (20 mg total) by mouth daily. 10/09/20  Yes Angiulli, Lavon Paganini, PA-C  umeclidinium bromide (INCRUSE ELLIPTA) 62.5 MCG/INH AEPB Inhale 1 puff into the lungs daily. Patient taking differently: Inhale 1 puff into the lungs every evening. 10/09/20  Yes Angiulli, Lavon Paganini, PA-C    Inpatient Medications: Scheduled Meds:   amiodarone  200 mg Oral Daily   aspirin EC  81 mg Oral q morning   atorvastatin  40 mg Oral QPM   busPIRone  5 mg Oral BID   docusate sodium  100 mg Oral BID   fluticasone furoate-vilanterol  1 puff Inhalation Daily   insulin aspart  0-15 Units Subcutaneous TID WC   insulin aspart  0-5 Units Subcutaneous QHS   midodrine  10 mg Oral TID WC   pantoprazole  40 mg Oral Daily   polyethylene glycol  17 g Oral Daily   sildenafil  40 mg Oral TID   sodium chloride flush  3 mL Intravenous Q12H   umeclidinium bromide  1 puff Inhalation Daily   Continuous Infusions:  ceFEPime (MAXIPIME) IV 2 g (12/03/20 1256)   diltiazem (CARDIZEM) infusion 5 mg/hr (12/03/20 1322)   PRN Meds: acetaminophen **OR** acetaminophen, bisacodyl, morphine injection, ondansetron **OR** ondansetron (ZOFRAN) IV, oxyCODONE, polyethylene glycol  Allergies:    Allergies  Allergen Reactions   Novocain [Procaine] Other (See Comments)    Unknown reaction as a young child    Social History:   Social History   Socioeconomic History   Marital status: Married    Spouse name: Not on file   Number of children: Not on file   Years of education: Not on file   Highest education level: Not on file  Occupational History   Occupation: Retired Arts administrator  Tobacco Use   Smoking status: Former    Packs/day: 1.00    Years: 10.00    Pack years: 10.00    Types: Cigarettes   Smokeless tobacco: Never  Substance and Sexual Activity   Alcohol use: No   Drug use: No   Sexual activity: Not on file  Other Topics Concern   Not on file  Social History Narrative   Not on file   Social Determinants of Health   Financial Resource Strain: Low Risk    Difficulty of Paying Living Expenses: Not very hard  Food Insecurity: No Food Insecurity   Worried About Charity fundraiser in the Last Year: Never true   [place] in the Last Year: Never true  Transportation Needs: No Transportation Needs   Lack of Transportation (Medical):  No   Lack of Transportation (Non-Medical): No  Physical Activity: Not on file  Stress: Not on file  Social Connections: Not on file  Intimate Partner Violence: Not on file    Family History:    Family History  Problem Relation Age of Onset   Diabetes Mother    Alzheimer's disease Mother    Stroke Father    Heart Problems Father        had a pacemaker   Diabetes Brother      ROS:  Please see the history of present illness.   All other ROS reviewed and negative.     Physical Exam/Data:   Vitals:   12/02/20 2300 12/03/20 0300 12/03/20 0849 12/03/20 1300  BP: 117/79 (!) 114/55 109/72 (!) 98/57  Pulse: (!) 110 92 (!) 131 66  Resp: (!) 24 (!) 29 (!) 32 (!) 26  Temp: 97.9 F (36.6 C) 97.9 F (36.6 C) 98.3 F (36.8 C)   TempSrc: Oral Axillary Oral   SpO2: 90% 100% 95% 91%  Weight:      Height:        Intake/Output Summary (Last 24 hours) at 12/03/2020 1341 Last data filed at 12/02/2020 1700 Gross per 24 hour  Intake 100 ml  Output 1900 ml  Net -1800 ml   Last 3 Weights 12/02/2020 12/02/2020 10/01/2020  Weight (lbs) 175 lb 11.3 oz 179 lb 3.2 oz 181 lb 3.5 oz  Weight (kg) 79.7 kg 81.285 kg 82.2 kg     Body mass index is 27.52 kg/m.  General:  Chronically ill appearing gentleman  HEENT: sclera anicteric Neck: no JVD Vascular: No carotid bruits; distal pulses 2+ bilaterally Cardiac:  normal S1, S2; IRIR; no overt murmurs, rubs, or gallops Lungs: decreased breath sounds in RLL, faint crackles at LLL.   Abd: soft, nontender, no hepatomegaly  Ext: no edema Musculoskeletal:  No deformities, BUE and BLE strength normal and equal Skin: warm and dry  Neuro:  CNs 2-12 intact, no focal abnormalities noted Psych:  Normal affect   EKG:  The EKG was personally reviewed and demonstrates:  difficult to interpret due to artifact/poor quality Telemetry:  Telemetry was personally reviewed and demonstrates:  atrial fibrillation with variable HRs ranging from 60s-130s,  PVCs  Relevant CV Studies:  Echocardiogram 08/2020: 1. There is severe RV enlargement with severely depressed RV systolic  function and moderately elevated PASP. The interventricular septum is  flattened in systole consistent with RV pressure overload. Constellation  of findings highly concerning for  pulmonary embolism. Cardiology team aware and CTA pending.   2. Left ventricular ejection fraction, by estimation, is 55 to 60%. The  left ventricle has normal function. Wall motion difficult to assess due to  poor visualization of the LV endocardium but appears grossly normal. There  is mild concentric left  ventricular hypertrophy. Diastolic function is indeterminant due to atrial  fibrillation. There is the interventricular septum is flattened in  systole, consistent with right ventricular pressure overload.   3. Right ventricular systolic function is severely reduced. The right  ventricular size is severely enlarged. There is moderately elevated  pulmonary artery systolic pressure. The estimated right ventricular  systolic pressure is 07.3 mmHg.   4. Right atrial size was moderately dilated.   5. The mitral valve is grossly normal. Mild mitral valve regurgitation.   6. Tricuspid valve regurgitation is moderate.   7. The aortic valve is tricuspid. There is moderate calcification of the  aortic valve. There is moderate thickening of the aortic valve. Aortic  valve regurgitation is trivial. Mild to moderate aortic valve  sclerosis/calcification is present, without any   evidence of aortic stenosis.   8. The inferior vena cava is dilated in size with <50% respiratory  variability, suggesting right atrial pressure of 15 mmHg.   R/LHC 08/2020: Assessment: 1. 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 There is a large RV marginal branch that is widely patent 2. EF 60-65% 3. Moderate PAH with elevated PVR and moderately  reduced cardiac  output   Plan/Discussion:   Will manage CAD medically. Will need w/u for PAH and cor pulmonale .  Diagnostic Dominance: Right    RHC 09/2020: Assessment: 1. Mild pulmonary HTN 2. Normal left-sided filling pressures 3. High cardiac output by Fick (suspect to hyperoxygenation with Bipap). Output normal by thermo 4. No evidence of intracardiac shunting.   Plan/Discussion:   Very mild PAH. Normal to high cardiac output. No evidence of intracardiac shunting. Cannot exclude peripheral shunting.  Echocardiogram limited bubble study 09/2020: There is no evidence of right to left shunting at rest or with the  Valsalva maneuver.  The right heart chambers are moderately-to-severely dilated and right  ventricular systolic function is severely decreased.     Laboratory Data:  High Sensitivity Troponin:   Recent Labs  Lab 12/02/20 1018 12/02/20 1232  TROPONINIHS 27* 27*     Chemistry Recent Labs  Lab 12/02/20 1018 12/03/20 0451  NA 137 141  K 4.1 3.7  CL 98 101  CO2 22 28  GLUCOSE 240* 119*  BUN 25* 22  CREATININE 1.25* 1.17  CALCIUM 8.8* 8.5*  GFRNONAA >60 >60  ANIONGAP 17* 12    Recent Labs  Lab 12/02/20 1018  PROT 7.4  ALBUMIN 3.5  AST 34  ALT 19  ALKPHOS 66  BILITOT 1.1   Hematology Recent Labs  Lab 12/02/20 1018 12/03/20 0451  WBC 10.4 9.3  RBC 4.90 4.52  HGB 11.4* 10.6*  HCT 39.6 35.5*  MCV 80.8 78.5*  MCH 23.3* 23.5*  MCHC 28.8* 29.9*  RDW 18.7* 18.2*  PLT 371 358   BNP Recent Labs  Lab 12/02/20 1018  BNP 574.4*    DDimer No results for input(s): DDIMER in the last 168 hours.   Radiology/Studies:  CT Chest Wo Contrast  Result Date: 12/02/2020 CLINICAL DATA:  Pleural effusion. EXAM: CT CHEST WITHOUT CONTRAST TECHNIQUE: Multidetector CT imaging of the chest was performed following the standard protocol without IV contrast. COMPARISON:  Sep 20, 2020 FINDINGS: Cardiovascular: Enlarged cardiac silhouette. Calcific atherosclerotic disease of  the coronary arteries and aorta. Mediastinum/Nodes: Increasing in mediastinal and right hilar lymphadenopathy. There is narrowing of the right upper and middle lobe bronchi. Lungs/Pleura: Increased in size right pleural effusion which now occupies approximately 50% of the right hemithorax. Previously demonstrated spiculated mass in the anterior aspect of the right middle lobe is obscured by airspace consolidation. Airspace consolidation/atelectasis is also present in most of the right lower lobe. Diffuse hazy airspace opacities persist throughout both lungs. Upper Abdomen: No acute abnormality. Musculoskeletal: No chest wall mass or suspicious bone lesions identified. IMPRESSION: 1. Increased in size right pleural effusion which now occupies approximately 50% of the right hemithorax. 2. Previously demonstrated spiculated mass in the anterior aspect of the right middle lobe is obscured by airspace consolidation. 3. Airspace consolidation/atelectasis in most of the right lower lobe and middle lobe. 4. Diffuse hazy airspace opacities persist throughout both lungs. 5. Increasing in mediastinal and right hilar lymphadenopathy. 6. Enlarged cardiac silhouette. 7. Calcific atherosclerotic disease of the coronary arteries and aorta. 8. Aortic atherosclerosis. Aortic Atherosclerosis (ICD10-I70.0). Electronically Signed   By: Fidela Salisbury M.D.   On: 12/02/2020 12:36   DG Chest Port 1 View  Result Date: 12/02/2020 CLINICAL DATA:  Short of breath. EXAM: PORTABLE CHEST 1 VIEW COMPARISON:  Radiograph 09/28/2020 FINDINGS: A stable large cardiac silhouette. There is dense opacification of the RIGHT lower lobe which is new from prior. Central venous congestion is present. No pneumothorax.  IMPRESSION: Dense opacification of the RIGHT lower lobe consistent with increased RIGHT pleural effusion. Presumably a component of atelectasis in the RIGHT lower lobe. Cannot exclude RIGHT lower lobe infiltrate Electronically Signed    By: Suzy Bouchard M.D.   On: 12/02/2020 10:53     Assessment and Plan:   1. Acute on chronic hypoxemic and hypercapnic respiratory failure: he presented with increased SOB, LE edema, and decreased UOP with po diuretics over the past week. Likely multifactorial in the setting of decompensated right heart failure, increased pleural effusion, and atelectasis. BNP 574. He was started on IV lasix with UOP -1.8L this admission. Additionally started on IV antibiotics. PCCM consulted with plans for right thoracentesis tomorrow.  - Will give IV lasix 70m x1 today, then start 42mBID tomorrow - Will update a limited echocardiogram to re-evaluate LV function.  - Continue to monitor strict I &Os and daily weights - Continue to monitor electrolytes and replete as needed to maintain K >4, Mg >2  - Continue antibiotics per primary team.  2. Persistent atrial fibrillation: suspect he has been in persistent afib since his discharge 09/2020. He is asymptomatic. Wife reports somewhat labile HR's at home, though generally <100. Hard to say how this is contributing to #1. His eliquis was held today in anticipation of a thoracentesis. He was transitioned from po>IV diltiazem for rate control, overall improved bu still quite labile on telemetry - Continue diltiazem gtt for now - Continue lovenox for now - anticipate restarting eliquis 2m41mID prior to discharge once clear no further procedures are necessary - Continue amiodarone 200m77mily  3. PAH 2/2 underlying OSA/OHS: was started on CPAP/Trilogy outpatient and has been tolerating well.  - Continue sildenafil - Continue CPAP - wife to bring home mask to avoid further irritation to nasal bridge   4. CAD: no complaints of chest pain. HsTrop with minimal elevation and flat trend not c/w ACS. EKG largely uninterruptable but non-ischemic.  - Continue aspirin and statin   Risk Assessment/Risk Scores:  { New York Heart Association (NYHA) Functional Class NYHA  Class II  CHA2DS2-VASc Score = 4  This indicates a 4.8% annual risk of stroke. The patient's score is based upon: CHF History: Yes HTN History: Yes Diabetes History: Yes Stroke History: No Vascular Disease History: No Age Score: 1 Gender Score: 0       For questions or updates, please contact [facility] consult [email or web address] for contact info under    Signed, KrisAbigail Butts-C  12/03/2020 1:41 PM   ---------------------------------------------------------------------------------------------   History and all data above reviewed.  Patient examined.  I agree with the findings as above.  PaulDavieon Stockhama 70 y50male with PAF, OHS/OSA currently on treatment with severe right heart failure and severe pulmonary hypertension, COPD, HTN, HLD, DM2, recent GI bleed now s/p treatment for H. pylori.  He presents today after 1 week of reduced responsiveness to torsemide and 24 hours of acute shortness of breath.  His wife notes that on Saturday he ate barbecue and on Saturday night became more short of breath, and by Sunday morning he was significantly short of breath with hypoxia on home pulse oximeter.  He also had elevated heart rates in atrial fibrillation.  ECG is difficult to interpret in the setting of significant artifact but does appear to show atrial fibrillation.  I suspect his A. fib has been persistent since hospital dismissal in May.  He continues on amiodarone and takes oral diltiazem  at home.  On presentation to the ED he was converted to IV diltiazem.  Rates are currently well controlled.  He is responding well to IV diuresis thus far.  Oral anticoagulation is on hold in anticipation of thoracentesis tomorrow.  No recurrent bleeding since last hospital admission after treatment of H. pylori infection.  Constitutional: No acute distress, sitting in the tripod position but subjectively not in distress. Eyes: pupils equally round and reactive to light, sclera  non-icteric, normal conjunctiva and lids ENMT: normal dentition, moist mucous membranes.  Over the bridge of the nose there is a large wound with granulation tissue and scab.  No active drainage noted Cardiovascular: irregular rhythm, normal rate, no murmurs. S1 and S2 normal. Radial pulses normal bilaterally. No jugular venous distention sitting upright.  Respiratory: Decreased breath sounds right lung base to the mid lung field. GI : normal bowel sounds, soft and nontender. No distention.   MSK: extremities warm, well perfused. No edema.  NEURO: grossly nonfocal exam, moves all extremities. PSYCH: alert and oriented x 3, normal mood and affect.   All available labs, radiology testing, previous records reviewed. Agree with documented assessment and plan of my colleague as stated above with the following additions or changes:  Principal Problem:   Acute on chronic respiratory failure with hypoxia ([place]) Active Problems:   Hyperlipidemia due to type 2 diabetes mellitus ([facility])   Atrial fibrillation ([place])   Acute on chronic right heart failure ([facility])   COPD with hypoxia ([facility])   Lung mass    Plan:  Acute on chronic right heart failure in the setting of severe pulmonary hypertension likely group 3 -appears to be in decompensated right heart failure.  Has responded well to diuresis thus far.  Continue diuresis today and tomorrow and will continue to evaluate for response.  Nidus for decompensation may have been increased salt load versus continued atrial fibrillation with possibly increased rates at home prompting decompensation and presentation.  We discussed strategies for titrating oral diuretics at home.  For now we will diurese in hospital.  Pulmonary medicine is involved as well and look forward to their recommendations for COPD as well as the management of the large right pleural effusion.  A plan to drain this tomorrow.  Atrial fibrillation-Eliquis is on hold for procedure, we will start IV  heparin.   Length of Stay:  LOS: 1 day   Elouise Munroe, MD [facility] 4:47 PM  12/03/2020

## 2020-12-03 NOTE — Progress Notes (Signed)
ANTICOAGULATION CONSULT NOTE - Initial Consult  Pharmacy Consult for Heparin Indication: atrial fibrillation  Allergies  Allergen Reactions   Novocain [Procaine] Other (See Comments)    Unknown reaction as a young child    Patient Measurements: Height: 5' 7"  (170.2 cm) Weight: 79.7 kg (175 lb 11.3 oz) IBW/kg (Calculated) : 66.1 Heparin Dosing Weight: 79.7  Vital Signs: Temp: 98.3 F (36.8 C) (07/18 0849) Temp Source: Oral (07/18 0849) BP: 109/72 (07/18 0849) Pulse Rate: 131 (07/18 0849)  Labs: Recent Labs    12/02/20 1018 12/02/20 1232 12/03/20 0451  HGB 11.4*  --  10.6*  HCT 39.6  --  35.5*  PLT 371  --  358  CREATININE 1.25*  --  1.17  TROPONINIHS 27* 27*  --     Estimated Creatinine Clearance: 57.7 mL/min (by C-G formula based on SCr of 1.17 mg/dL).   Medical History: Past Medical History:  Diagnosis Date   Accidentally struck by tree 2013   with skull fx, lung contusion, left clavicle Fx, bilateral pelvic Fx.    Atrial fibrillation ([place]) 09/2020   on Eliquis   Diabetes mellitus without complication ([place])    Epistaxis 2014   Secondary to a nasal abnormality, treated and resolved   Hyperlipidemia due to type 2 diabetes mellitus ([place])    Hypertension     Assessment: 70 year old male who presented for a COPD exacerbation on Eliquis for atrial fibrillation prior to admission and transitioned to subcutaneous heparin for DVT ppx. Pharmacy was consulted for IV heparin dosing. Since he was receiving  and received his last dose of 5000 Units of heparin at 0544 on 7/18 and last Eliquis dose was 0700 on 7/17, no bolus is necessary and 1200 units/hr was initiated. Because patient was taking Eliquis at home, then heparin dosing should be based on the aPTT until the levels correlate with heparin levels. No signs or symptoms of bleeding.  Goal of Therapy:  Heparin level 0.3-0.7 units/ml aPTT 66-102 seconds Monitor platelets by anticoagulation protocol: Yes   Plan:   Discontinue subcutaneous heparin Initiate heparin IV 1200 Units/hr Check heparin level and aPTT 8hrs after infusion starts Follow-up time of thoracentesis to hold heparin IV  Daily heparin level, aPTT, and CBC Monitor for signs and symptoms of bleeding   Varney Daily, PharmD PGY1 Pharmacy Resident  Please check AMION for all [facility] pharmacy phone numbers After 10:00 PM call main pharmacy [phone]

## 2020-12-03 NOTE — Progress Notes (Signed)
Pharmacy Antibiotic Note  Tamar Lipscomb is a 70 y.o. male admitted on 12/02/2020 with  HCAP .  Pharmacy has been consulted for Cefepime dosing.  Plan: Start cefepime IV 2 g q12h for HCAP  Height: 5' 7"  (170.2 cm) Weight: 79.7 kg (175 lb 11.3 oz) IBW/kg (Calculated) : 66.1  Temp (24hrs), Avg:98 F (36.7 C), Min:97.9 F (36.6 C), Max:98.3 F (36.8 C)  Recent Labs  Lab 12/02/20 1018 12/02/20 1228 12/03/20 0451  WBC 10.4  --  9.3  CREATININE 1.25*  --  1.17  LATICACIDVEN 3.3* 1.6  --     Estimated Creatinine Clearance: 57.7 mL/min (by C-G formula based on SCr of 1.17 mg/dL).    Allergies  Allergen Reactions   Novocain [Procaine] Other (See Comments)    Unknown reaction as a young child    Antimicrobials this admission: Cefepime 7/18 >>   Microbiology results: 7/18 BCx: none collected   Thank you for allowing pharmacy to be a part of this patient's care.  Varney Daily, PharmD PGY1 Pharmacy Resident  Please check AMION for all [facility] pharmacy phone numbers After 10:00 PM call main pharmacy [phone]

## 2020-12-03 NOTE — Progress Notes (Signed)
NAME:  Gary [name], MRN:  [id number], DOB:  1950/08/16, LOS: 1 ADMISSION DATE:  12/02/2020, CONSULTATION DATE: 12/02/2020 REFERRING MD: Dr. 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  He was hospitalized in April with subsequent rehab stay (home 5/25) for hypoxemic respiratory failure.  That course was characterized by improvement with slow diuresis, addition of sildenafil, right thoracentesis 1200 cc transudate.  Complicated by an upper GI bleed due to peptic ulcer disease.  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  He states that he woke up on 7/17 feeling systemically ill "like he was hit by a truck".  Dyspneic and progressively hypoxemic.  He has not had any sick exposures, denies any viral prodrome.  He has stable cough productive of some white sputum, denies purulence or hemoptysis.  He has been taking his medications reliably and has also been reliable with his oxygen and trilogy ventilator at night.  In the emergency department he had increased hypoxemia, O2 uptitrated initially to 1.00 mask, then Venturi mask and now on 15 L/min high flow nasal cannula.  He has undergone diuresis and states that he is beginning to feel some improvement but is clearly not back to usual baseline and has a higher oxygen need above his baseline 6 L/min.  Pertinent  Medical History   Past Medical History:  Diagnosis Date   Accidentally struck by tree 2013   with skull fx, lung contusion, left clavicle Fx, bilateral pelvic Fx.    Atrial fibrillation ([place]) 09/2020   on Eliquis   Diabetes mellitus without complication ([place])    Epistaxis 2014    Secondary to a nasal abnormality, treated and resolved   Hyperlipidemia due to type 2 diabetes mellitus ([place])    Hypertension      Significant Hospital Events: Including procedures, antibiotic start and stop dates in addition to other pertinent events   7/17 Admit with SOB, hypoxia.  CT chest showed mediastinal and hilar lymphadenopathy with narrowing of the right upper and right middle lobe bronchi.  Increased right pleural effusion, moderate in size with complete atelectasis of the right lower lobe, almost complete atelectasis of the right middle lobe.  Diffuse hazy airspace opacities bilaterally  Interim History / Subjective:  Pt reports he urinated all night after lasix, states his breathing is better  I/O ~ 2.5L (wife who is an EMT reports, not documented)  Objective   Blood pressure 109/72, pulse (!) 131, temperature 98.3 F (36.8 C), temperature source Oral, resp. rate (!) 32, height 5' 7"  (1.702 m), weight 79.7 kg, SpO2 95 %.    FiO2 (%):  [55 %] 55 %   Intake/Output Summary (Last 24 hours) at 12/03/2020 1324 Last data filed at 12/02/2020 1700 Gross per 24 hour  Intake 100 ml  Output 1900 ml  Net -1800 ml   Filed Weights   12/02/20 1522 12/02/20 1906  Weight: 81.3 kg 79.7 kg    Examination: General: chronically ill appearing adult male sitting up in chair in NAD HEENT: MM pink/moist, [place] O2 15L, nasal bridge pressure ulcer dry, anicteric  Neuro: AAOx4, speech clear, MAE  CV: s1s2 irr irr, AF 90's on monitor, no m/r/g PULM: non-labored on 15L, prolonged exp  phase, lungs with posterior crackles  GI: soft, bsx4 active  Extremities: warm/dry, trace to 1+ edema  Skin: no rashes or lesions  Resolved Hospital Problem list     Assessment & Plan:   Acute on chronic hypoxemic and hypercapnic respiratory failure.  Multifactorial, principally due to decompensated right (more so than left) heart failure, increased pleural effusion with right middle lobe and right lower lobe  atelectasis.   No evidence of acute exacerbation of his COPD, no wheezing. -plan for R thoracentesis 7/18 to tap dry > suspect transudative effusion due to decompensated cor pulmonale  -follow up CT chest after thora to evaluate RML spiculated mass  -gentle diuresis, assess for need daily  -plan to resume anticoagulation after procedure -continue bronchodilators  Multifactorial chronic hypercapnic and hypoxic respiratory failure on nocturnal ventilation -QHS nocturnal ventilation, have asked wife to bring home mask due to nasal injury  -O2 as needed for sats >90%  Secondary pulmonary hypertension due to underlying lung disease, OSA/OHS, left-sided heart disease. -diuresis per Cardiology,  -continue sildenafil  -ensure therapy for OSA/OHS   Right middle lobe spiculated nodule.  Very suspicious for primary malignancy. -CT chest w/o contrast post thoracentesis to evaluate RML nodule  -given his O2 needs, deconditioning and recent prolonged hospitalization with difficulty coming off mechanical ventilation makes him high risk for procedures and will have to move forward cautiously with work up      Acute renal insufficiency  Baseline creatinine 0.96 10/08/2020 -follow BMP    Best Practice (right click and "Reselect all SmartList Selections" daily)  Diet/type: Regular consistency (see orders) DVT prophylaxis: prophylactic heparin  GI prophylaxis: PPI Lines: N/A Foley:  N/A Code Status:  full code Last date of multidisciplinary goals of care discussion: per primary   Critical care time: [place], MSN, APRN, NP-C, AGACNP-BC [facility] 12/03/2020, 1:24 PM   Please see [email or web address] for pager details.   From 7A-7P if no response, please call [phone] After hours, please call ELink [phone]

## 2020-12-03 NOTE — Plan of Care (Signed)

## 2020-12-03 NOTE — Evaluation (Signed)
Occupational Therapy Evaluation Patient Details Name: Gary [name] MRN: [id number] DOB: 07-24-1950 Today's Date: 12/03/2020    History of Present Illness 70 y.o. male presenting to ED 7/17 with SOB. Patient admitted with acute on chronic respiratory failure with hypoxia and acute on chronic R-sided heart failure. Previous admission 4/18-5/17 for acute respiratory failure with hypoxia associated with PAH with cor pulmonale. PMHx significant for DM, CHF, lung mass, HTN, CHF, and COPD on home O2.   Clinical Impression   PTA patient was living with his spouse in a private residence and reports ability to complete ADLs including bathing/dressing at shower level with supervision A and use of RW vs SPC. Patient currently functioning below baseline and is greatly limited by decreased cardiopulmonary status requiring 15L via HFNC (desat to 87% with transfer to recliner) and generalized weakness. Session limited to bed to chair only secondary to the above. Patient would benefit from continued acute OT services in prep for safe d/c home. Patient/family declining HHOT but would like HHPT post d/c. OT will continue to follow acutely.      Follow Up Recommendations  No OT follow up;Other (comment) (Family declining [facility] but would like HHPT)    Equipment Recommendations  None recommended by OT (Patient has necessary DME)    Recommendations for Other Services       Precautions / Restrictions Precautions Precautions: Fall Precaution Comments: Monitor HR and SpO2 Restrictions Weight Bearing Restrictions: No      Mobility Bed Mobility Overal bed mobility: Needs Assistance Bed Mobility: Supine to Sit     Supine to sit: Supervision     General bed mobility comments: Supervision A for supine to EOB with HOB elevated >45 degrees. Cues for pacing.    Transfers Overall transfer level: Needs assistance Equipment used: 1 person hand held assist Transfers: Sit to/from [employer] Sit to Stand: Min guard Stand pivot transfers: Min guard       General transfer comment: Min guard with HHA +1 and cues for pacing for sit to stand from EOB positioned in lowest setting and for stand-pivot to recliner.    Balance Overall balance assessment: Mild deficits observed, not formally tested                                         ADL either performed or assessed with clinical judgement   ADL Overall ADL's : Needs assistance/impaired                     Lower Body Dressing: Min guard;Sit to/from stand                 General ADL Comments: Patient greatly limited by decreased cardiopulmonary status and decreased activity tolerance requiring 15L HFNC.     Vision Baseline Vision/History: Wears glasses Wears Glasses: At all times Patient Visual Report: No change from baseline       Perception     Praxis      Pertinent Vitals/Pain Pain Assessment: No/denies pain     Hand Dominance Right   Extremity/Trunk Assessment Upper Extremity Assessment Upper Extremity Assessment: Generalized weakness   Lower Extremity Assessment Lower Extremity Assessment: Defer to PT evaluation   Cervical / Trunk Assessment Cervical / Trunk Assessment: Kyphotic   Communication Communication Communication: No difficulties   Cognition Arousal/Alertness: Awake/alert Behavior During Therapy: WFL for tasks assessed/performed Overall Cognitive Status: Within Functional  Limits for tasks assessed                                     General Comments  SpO2 94% on 15L HFNC, HR 102bpm and RR 27. Desat to 87% with sit to stand and stand-pivot to recliner. Max HR 140's with bed mobility. Session limited to bed > chair only secondary to increased HR and decreased SpO2 with mild activity.    Exercises     Shoulder Instructions      Home Living Family/patient expects to be discharged to:: Private residence Living Arrangements:  Spouse/significant other Available Help at Discharge: Family;Available PRN/intermittently Type of Home: House Home Access: Stairs to enter;Ramped entrance;Other (comment) (ramped entrance connected to back porch) Entrance Stairs-Number of Steps: 2 Entrance Stairs-Rails: None Home Layout: One level     Bathroom Shower/Tub: Occupational psychologist: Standard Bathroom Accessibility: Yes   Home Equipment: Environmental consultant - 2 wheels;Cane - quad;Shower seat;Grab bars - tub/shower;Other (comment) (Adjustable matress, home O2)          Prior Functioning/Environment Level of Independence: Needs assistance  Gait / Transfers Assistance Needed: Reports use of RW vs SPC for household ambulation ADL's / Homemaking Assistance Needed: Wife provides supervision A for bathing at shower level; reports providing PRN supervision A for toilet transfers            OT Problem List: Decreased strength;Decreased activity tolerance;Impaired balance (sitting and/or standing);Cardiopulmonary status limiting activity      OT Treatment/Interventions: Self-care/ADL training;Therapeutic exercise;Energy conservation;Therapeutic activities;Patient/family education;Balance training    OT Goals(Current goals can be found in the care plan section) Acute Rehab OT Goals Patient Stated Goal: To return home. OT Goal Formulation: With patient Time For Goal Achievement: 12/17/20 Potential to Achieve Goals: Good ADL Goals Additional ADL Goal #1: Patient will complete ADLs with supervision A grossly with SpO2 >90% on 3L O2 vi [place] in prep for safe d/c home. Additional ADL Goal #2: Patient will independently recall 3 energy conservation techinques in prep for ADLs/IADLs. Additional ADL Goal #3: Patient will recall 3 techniques to decrease risk of falls in prep for safe return home.  OT Frequency: Min 2X/week   Barriers to D/C:            Co-evaluation              AM-PAC OT "6 Clicks" Daily Activity      Outcome Measure Help from another person eating meals?: None Help from another person taking care of personal grooming?: A Little Help from another person toileting, which includes using toliet, bedpan, or urinal?: A Little Help from another person bathing (including washing, rinsing, drying)?: A Little Help from another person to put on and taking off regular upper body clothing?: A Little Help from another person to put on and taking off regular lower body clothing?: A Little 6 Click Score: 19   End of Session Equipment Utilized During Treatment: Gait belt;Oxygen Nurse Communication: Mobility status  Activity Tolerance: Patient tolerated treatment well;Other (comment) (Session limited 2/2 vitals) Patient left: in chair;with call bell/phone within reach  OT Visit Diagnosis: Unsteadiness on feet (R26.81);Muscle weakness (generalized) (M62.81)                Time: 0349-1791 OT Time Calculation (min): 29 min Charges:  OT General Charges $OT Visit: 1 Visit OT Evaluation $OT Eval Moderate Complexity: 1 Mod OT Treatments $Therapeutic Activity: 8-22  mins  Aaira Oestreicher H. OTR/L Supplemental OT, Department of rehab services [phone]  Takiyah Bohnsack R H. 12/03/2020, 10:54 AM

## 2020-12-03 NOTE — Progress Notes (Signed)
Initial Nutrition Assessment  DOCUMENTATION CODES:   Not applicable  INTERVENTION:  Provide Ensure Enlive po BID, each supplement provides 350 kcal and 20 grams of protein  Encourage adequate PO intake.   NUTRITION DIAGNOSIS:   Increased nutrient needs related to chronic illness (COPD, CHF) as evidenced by estimated needs.  GOAL:   Patient will meet greater than or equal to 90% of their needs  MONITOR:   PO intake, Supplement acceptance, Skin, Labs, I & O's  REASON FOR ASSESSMENT:   Consult Assessment of nutrition requirement/status  ASSESSMENT:   70 year old male with history of diabetes, hypertension, hyperlipidemia, congestive heart failure, COPD on home oxygen/trilogy machine who presented with shortness of breath. CT chest done on this admission showed right pleural effusion, a spiculated mass on the right side, airspace consolidation.  Pt unavailable during attempted time of visit. Pt is currently on HFNC. Per MD, breathing improving. Pt currently on lasix diuresing. Plans for R thoracentesis today. RD unable to obtain pt nutrition history at this time. RD to order nutritional supplements to aid in caloric and protein needs. Unable to complete Nutrition-Focused physical exam at this time.   Labs and medications reviewed.   Diet Order:   Diet Order             Diet heart healthy/carb modified Room service appropriate? Yes; Fluid consistency: Thin  Diet effective now                   EDUCATION NEEDS:   Education needs have been addressed  Skin:  Skin Assessment: Reviewed RN Assessment  Last BM:  7/16  Height:   Ht Readings from Last 1 Encounters:  12/02/20 5' 7"  (1.702 m)    Weight:   Wt Readings from Last 1 Encounters:  12/02/20 79.7 kg    BMI:  Body mass index is 27.52 kg/m.  Estimated Nutritional Needs:   Kcal:  2000-2200  Protein:  100-110 grams  Fluid:  >/= 2 L/day  Corrin Parker, MS, RD, LDN RD pager number/after hours  weekend pager number on Amion.

## 2020-12-04 ENCOUNTER — Inpatient Hospital Stay (HOSPITAL_COMMUNITY): Payer: Medicare Other

## 2020-12-04 DIAGNOSIS — R0602 Shortness of breath: Secondary | ICD-10-CM | POA: Diagnosis not present

## 2020-12-04 DIAGNOSIS — I4819 Other persistent atrial fibrillation: Secondary | ICD-10-CM | POA: Diagnosis not present

## 2020-12-04 DIAGNOSIS — J9621 Acute and chronic respiratory failure with hypoxia: Secondary | ICD-10-CM | POA: Diagnosis not present

## 2020-12-04 DIAGNOSIS — I509 Heart failure, unspecified: Secondary | ICD-10-CM | POA: Diagnosis not present

## 2020-12-04 DIAGNOSIS — J90 Pleural effusion, not elsewhere classified: Secondary | ICD-10-CM | POA: Diagnosis not present

## 2020-12-04 DIAGNOSIS — J9622 Acute and chronic respiratory failure with hypercapnia: Secondary | ICD-10-CM | POA: Diagnosis not present

## 2020-12-04 LAB — BODY FLUID CELL COUNT WITH DIFFERENTIAL
Eos, Fluid: 0 %
Lymphs, Fluid: 93 %
Monocyte-Macrophage-Serous Fluid: 6 % — ABNORMAL LOW (ref 50–90)
Neutrophil Count, Fluid: 1 % (ref 0–25)
Total Nucleated Cell Count, Fluid: 810 uL (ref 0–1000)

## 2020-12-04 LAB — CBC WITH DIFFERENTIAL/PLATELET
Abs Immature Granulocytes: 0.03 10*3/uL (ref 0.00–0.07)
Basophils Absolute: 0 10*3/uL (ref 0.0–0.1)
Basophils Relative: 0 %
Eosinophils Absolute: 0.1 10*3/uL (ref 0.0–0.5)
Eosinophils Relative: 1 %
HCT: 32.1 % — ABNORMAL LOW (ref 39.0–52.0)
Hemoglobin: 9.6 g/dL — ABNORMAL LOW (ref 13.0–17.0)
Immature Granulocytes: 0 %
Lymphocytes Relative: 8 %
Lymphs Abs: 0.9 10*3/uL (ref 0.7–4.0)
MCH: 23.1 pg — ABNORMAL LOW (ref 26.0–34.0)
MCHC: 29.9 g/dL — ABNORMAL LOW (ref 30.0–36.0)
MCV: 77.3 fL — ABNORMAL LOW (ref 80.0–100.0)
Monocytes Absolute: 1 10*3/uL (ref 0.1–1.0)
Monocytes Relative: 10 %
Neutro Abs: 8.5 10*3/uL — ABNORMAL HIGH (ref 1.7–7.7)
Neutrophils Relative %: 81 %
Platelets: 308 10*3/uL (ref 150–400)
RBC: 4.15 MIL/uL — ABNORMAL LOW (ref 4.22–5.81)
RDW: 18.4 % — ABNORMAL HIGH (ref 11.5–15.5)
WBC: 10.5 10*3/uL (ref 4.0–10.5)
nRBC: 0 % (ref 0.0–0.2)

## 2020-12-04 LAB — BASIC METABOLIC PANEL
Anion gap: 7 (ref 5–15)
BUN: 20 mg/dL (ref 8–23)
CO2: 30 mmol/L (ref 22–32)
Calcium: 8.1 mg/dL — ABNORMAL LOW (ref 8.9–10.3)
Chloride: 99 mmol/L (ref 98–111)
Creatinine, Ser: 0.99 mg/dL (ref 0.61–1.24)
GFR, Estimated: >60 mL/min (ref >60)
Glucose, Bld: 114 mg/dL — ABNORMAL HIGH (ref 70–99)
Potassium: 3.3 mmol/L — ABNORMAL LOW (ref 3.5–5.1)
Sodium: 136 mmol/L (ref 135–145)

## 2020-12-04 LAB — LACTATE DEHYDROGENASE, PLEURAL OR PERITONEAL FLUID: LD, Fluid: 124 U/L — ABNORMAL HIGH (ref 3–23)

## 2020-12-04 LAB — ECHOCARDIOGRAM LIMITED
Calc EF: 54.6 %
Height: 67 in
S' Lateral: 2.6 cm
Single Plane A2C EF: 56.5 %
Single Plane A4C EF: 52.1 %
Weight: 2811.31 oz

## 2020-12-04 LAB — GLUCOSE, CAPILLARY
Glucose-Capillary: 140 mg/dL — ABNORMAL HIGH (ref 70–99)
Glucose-Capillary: 154 mg/dL — ABNORMAL HIGH (ref 70–99)
Glucose-Capillary: 155 mg/dL — ABNORMAL HIGH (ref 70–99)
Glucose-Capillary: 209 mg/dL — ABNORMAL HIGH (ref 70–99)

## 2020-12-04 LAB — PROCALCITONIN: Procalcitonin: <0.1 ng/mL

## 2020-12-04 LAB — IRON AND TIBC
Iron: 29 ug/dL — ABNORMAL LOW (ref 45–182)
Saturation Ratios: 7 % — ABNORMAL LOW (ref 17.9–39.5)
TIBC: 406 ug/dL (ref 250–450)
UIBC: 377 ug/dL

## 2020-12-04 LAB — PROTEIN, PLEURAL OR PERITONEAL FLUID: Total protein, fluid: 4 g/dL

## 2020-12-04 LAB — MAGNESIUM: Magnesium: 1.5 mg/dL — ABNORMAL LOW (ref 1.7–2.4)

## 2020-12-04 LAB — HEPARIN LEVEL (UNFRACTIONATED): Heparin Unfractionated: >1.1 IU/mL — ABNORMAL HIGH (ref 0.30–0.70)

## 2020-12-04 LAB — GLUCOSE, PLEURAL OR PERITONEAL FLUID: Glucose, Fluid: 174 mg/dL

## 2020-12-04 LAB — APTT: aPTT: 76 seconds — ABNORMAL HIGH (ref 24–36)

## 2020-12-04 LAB — FERRITIN: Ferritin: 32 ng/mL (ref 24–336)

## 2020-12-04 MED ORDER — MAGNESIUM SULFATE 2 GM/50ML IV SOLN
2.0000 g | Freq: Once | INTRAVENOUS | Status: AC
  Administered 2020-12-04: 2 g via INTRAVENOUS
  Filled 2020-12-04: qty 50

## 2020-12-04 MED ORDER — DILTIAZEM HCL 30 MG PO TABS
30.0000 mg | ORAL_TABLET | Freq: Four times a day (QID) | ORAL | Status: DC
  Administered 2020-12-04 – 2020-12-11 (×22): 30 mg via ORAL
  Filled 2020-12-04 (×25): qty 1

## 2020-12-04 MED ORDER — LACTATED RINGERS IV BOLUS
500.0000 mL | Freq: Once | INTRAVENOUS | Status: AC
  Administered 2020-12-04: 500 mL via INTRAVENOUS

## 2020-12-04 MED ORDER — HEPARIN (PORCINE) 25000 UT/250ML-% IV SOLN
1800.0000 [IU]/h | INTRAVENOUS | Status: DC
  Administered 2020-12-04: 1200 [IU]/h via INTRAVENOUS
  Administered 2020-12-06 – 2020-12-07 (×2): 1800 [IU]/h via INTRAVENOUS
  Filled 2020-12-04 (×6): qty 250

## 2020-12-04 MED ORDER — POTASSIUM CHLORIDE CRYS ER 20 MEQ PO TBCR
40.0000 meq | EXTENDED_RELEASE_TABLET | Freq: Once | ORAL | Status: AC
  Administered 2020-12-04: 40 meq via ORAL
  Filled 2020-12-04: qty 2

## 2020-12-04 NOTE — Progress Notes (Addendum)
Progress Note  Patient Name: Gary [name] Date of Encounter: 12/04/2020  [facility] Cardiologist: Elouise Munroe, MD   Subjective   Underwent thoracentesis, resting had xanax so takes a few seconds to answer questions  Inpatient Medications    Scheduled Meds:  amiodarone  200 mg Oral Daily   aspirin EC  81 mg Oral q morning   atorvastatin  40 mg Oral QPM   busPIRone  5 mg Oral BID   docusate sodium  100 mg Oral BID   feeding supplement  237 mL Oral BID BM   ferrous sulfate  325 mg Oral Q breakfast   fluticasone furoate-vilanterol  1 puff Inhalation Daily   furosemide  40 mg Intravenous BID   insulin aspart  0-15 Units Subcutaneous TID WC   insulin aspart  0-5 Units Subcutaneous QHS   midodrine  10 mg Oral TID WC   pantoprazole  40 mg Oral Daily   polyethylene glycol  17 g Oral Daily   sildenafil  40 mg Oral TID   sodium chloride flush  3 mL Intravenous Q12H   umeclidinium bromide  1 puff Inhalation Daily   Continuous Infusions:  diltiazem (CARDIZEM) infusion Stopped (12/03/20 2349)   heparin 1,200 Units/hr (12/03/20 1743)   magnesium sulfate bolus IVPB     PRN Meds: acetaminophen **OR** acetaminophen, ALPRAZolam, bisacodyl, morphine injection, ondansetron **OR** ondansetron (ZOFRAN) IV, oxyCODONE, polyethylene glycol, sodium chloride   Vital Signs    Vitals:   12/04/20 1134 12/04/20 1239 12/04/20 1247 12/04/20 1405  BP: 97/61 97/65 97/70  118/88  Pulse: 98 (!) 115 (!) 114 (!) 106  Resp: (!) 22 18 15  (!) 25  Temp: 98.5 F (36.9 C) 98.3 F (36.8 C)    TempSrc: Oral Oral    SpO2: 96% 96%  91%  Weight:      Height:        Intake/Output Summary (Last 24 hours) at 12/04/2020 1413 Last data filed at 12/04/2020 1100 Gross per 24 hour  Intake --  Output 2100 ml  Net -2100 ml   Last 3 Weights 12/02/2020 12/02/2020 10/01/2020  Weight (lbs) 175 lb 11.3 oz 179 lb 3.2 oz 181 lb 3.5 oz  Weight (kg) 79.7 kg 81.285 kg 82.2 kg      Telemetry    Atrial fib  - Personally Reviewed  ECG    No new - Personally Reviewed  Physical Exam   GEN: No acute distress.   Neck: No JVD Cardiac: irreg irreg, no murmurs, rubs, or gallops.  Respiratory: Rhonchi throughout  to auscultation bilaterally. GI: Soft, nontender, non-distended  MS: No edema; No deformity. Neuro:  Nonfocal  Psych: Normal affect   Labs    High Sensitivity Troponin:   Recent Labs  Lab 12/02/20 1018 12/02/20 1232  TROPONINIHS 27* 27*      Chemistry Recent Labs  Lab 12/02/20 1018 12/03/20 0451 12/04/20 0206  NA 137 141 136  K 4.1 3.7 3.3*  CL 98 101 99  CO2 22 28 30   GLUCOSE 240* 119* 114*  BUN 25* 22 20  CREATININE 1.25* 1.17 0.99  CALCIUM 8.8* 8.5* 8.1*  PROT 7.4  --   --   ALBUMIN 3.5  --   --   AST 34  --   --   ALT 19  --   --   ALKPHOS 66  --   --   BILITOT 1.1  --   --   GFRNONAA >60 >60 >60  ANIONGAP 17* 12  7     Hematology Recent Labs  Lab 12/02/20 1018 12/03/20 0451 12/04/20 0206  WBC 10.4 9.3 10.5  RBC 4.90 4.52 4.15*  HGB 11.4* 10.6* 9.6*  HCT 39.6 35.5* 32.1*  MCV 80.8 78.5* 77.3*  MCH 23.3* 23.5* 23.1*  MCHC 28.8* 29.9* 29.9*  RDW 18.7* 18.2* 18.4*  PLT 371 358 308    BNP Recent Labs  Lab 12/02/20 1018  BNP 574.4*     DDimer No results for input(s): DDIMER in the last 168 hours.   Radiology    ECHOCARDIOGRAM LIMITED  Result Date: 12/04/2020    ECHOCARDIOGRAM LIMITED REPORT   Patient Name:   Gary [name] Date of Exam: 12/04/2020 Medical Rec #:  [id number]          Height:       67.0 in Accession #:    [id number]         Weight:       175.7 lb Date of Birth:  [birth year]          BSA:          1.914 m Patient Age:    70 years           BP:           113/71 mmHg Patient Gender: M                  HR:           101 bpm. Exam Location:  Inpatient Procedure: Limited Echo, Cardiac Doppler, Color Doppler and 3D Echo Indications:    R06.02 SOB  History:        Patient has prior history of Echocardiogram examinations, most                  recent 09/25/2020. Abnormal ECG, COPD, Arrythmias:Atrial Flutter,                 Atrial Fibrillation and RBBB, Signs/Symptoms:Shortness of Breath                 and Dyspnea; Risk Factors:Dyslipidemia. RV failure.  Sonographer:    [place] Referring Phys: [id number] Abigail Butts  Sonographer Comments: Patient in high fowler's position due to hypoxia. Pateint destatted to 69 just moving from chair to bed for echo. IMPRESSIONS  1. Left ventricular ejection fraction, by estimation, is 55 to 60%. The left ventricle has normal function. The left ventricle has no regional wall motion abnormalities. There is moderate concentric left ventricular hypertrophy. Left ventricular diastolic function could not be evaluated. There is the interventricular septum is flattened in systole and diastole, consistent with right ventricular pressure and volume overload.  2. Right ventricular systolic function is severely reduced. The right ventricular size is moderately enlarged. There is moderately elevated pulmonary artery systolic pressure. The estimated right ventricular systolic pressure is 25.8 mmHg.  3. Left atrial size was moderately dilated.  4. Right atrial size was moderately dilated.  5. The mitral valve is normal in structure. Trivial mitral valve regurgitation.  6. The aortic valve is tricuspid. There is mild calcification of the aortic valve. Aortic valve regurgitation is not visualized. Mild aortic valve sclerosis is present, with no evidence of aortic valve stenosis.  7. The inferior vena cava is dilated in size with <50% respiratory variability, suggesting right atrial pressure of 15 mmHg. Comparison(s): No significant change from prior study. Prior images reviewed side by side. FINDINGS  Left Ventricle: Left ventricular ejection fraction, by estimation,  is 55 to 60%. The left ventricle has normal function. The left ventricle has no regional wall motion abnormalities. The left ventricular internal cavity  size was normal in size. There is  moderate concentric left ventricular hypertrophy. The interventricular septum is flattened in systole and diastole, consistent with right ventricular pressure and volume overload. Left ventricular diastolic function could not be evaluated. Left ventricular diastolic function could not be evaluated due to atrial fibrillation. Right Ventricle: The right ventricular size is moderately enlarged. Right ventricular systolic function is severely reduced. There is moderately elevated pulmonary artery systolic pressure. The tricuspid regurgitant velocity is 3.33 m/s, and with an assumed right atrial pressure of 15 mmHg, the estimated right ventricular systolic pressure is 42.6 mmHg. Left Atrium: Left atrial size was moderately dilated. Right Atrium: Right atrial size was moderately dilated. Pericardium: There is no evidence of pericardial effusion. Mitral Valve: The mitral valve is normal in structure. There is mild thickening of the mitral valve leaflet(s). Mild mitral annular calcification. Trivial mitral valve regurgitation. Tricuspid Valve: The tricuspid valve is normal in structure. Tricuspid valve regurgitation is mild. Aortic Valve: The aortic valve is tricuspid. There is mild calcification of the aortic valve. Aortic valve regurgitation is not visualized. Mild aortic valve sclerosis is present, with no evidence of aortic valve stenosis. Pulmonic Valve: The pulmonic valve was normal in structure. Pulmonic valve regurgitation is trivial. Aorta: The aortic root and ascending aorta are structurally normal, with no evidence of dilitation. Venous: The inferior vena cava is dilated in size with less than 50% respiratory variability, suggesting right atrial pressure of 15 mmHg. IAS/Shunts: No atrial level shunt detected by color flow Doppler. Additional Comments: There is pleural effusion in the right lateral region. LEFT VENTRICLE PLAX 2D LVIDd:         3.50 cm LVIDs:         2.60 cm LV  PW:         1.60 cm LV IVS:        1.40 cm LVOT diam:     1.90 cm LVOT Area:     2.84 cm  LV Volumes (MOD) LV vol d, MOD A2C: 88.8 ml LV vol d, MOD A4C: 59.3 ml LV vol s, MOD A2C: 38.6 ml LV vol s, MOD A4C: 28.4 ml LV SV MOD A2C:     50.2 ml LV SV MOD A4C:     59.3 ml LV SV MOD BP:      41.1 ml IVC IVC diam: 2.40 cm LEFT ATRIUM         Index LA diam:    4.60 cm 2.40 cm/m   AORTA Ao Root diam: 3.00 cm Ao Asc diam:  3.90 cm TRICUSPID VALVE TR Peak grad:   44.4 mmHg TR Vmax:        333.00 cm/s  SHUNTS Systemic Diam: 1.90 cm Sanda Klein MD Electronically signed by Sanda Klein MD Signature Date/Time: 12/04/2020/10:04:05 AM    Final     Cardiac Studies   Echo 12/04/20 IMPRESSIONS     1. Left ventricular ejection fraction, by estimation, is 55 to 60%. The  left ventricle has normal function. The left ventricle has no regional  wall motion abnormalities. There is moderate concentric left ventricular  hypertrophy. Left ventricular  diastolic function could not be evaluated. There is the interventricular  septum is flattened in systole and diastole, consistent with right  ventricular pressure and volume overload.   2. Right ventricular systolic function is severely reduced. The right  ventricular size is moderately enlarged. There is moderately elevated  pulmonary artery systolic pressure. The estimated right ventricular  systolic pressure is 15.4 mmHg.   3. Left atrial size was moderately dilated.   4. Right atrial size was moderately dilated.   5. The mitral valve is normal in structure. Trivial mitral valve  regurgitation.   6. The aortic valve is tricuspid. There is mild calcification of the  aortic valve. Aortic valve regurgitation is not visualized. Mild aortic  valve sclerosis is present, with no evidence of aortic valve stenosis.   7. The inferior vena cava is dilated in size with <50% respiratory  variability, suggesting right atrial pressure of 15 mmHg.   Comparison(s): No  significant change from prior study. Prior images  reviewed side by side.   FINDINGS   Left Ventricle: Left ventricular ejection fraction, by estimation, is 55  to 60%. The left ventricle has normal function. The left ventricle has no  regional wall motion abnormalities. The left ventricular internal cavity  size was normal in size. There is   moderate concentric left ventricular hypertrophy. The interventricular  septum is flattened in systole and diastole, consistent with right  ventricular pressure and volume overload. Left ventricular diastolic  function could not be evaluated. Left  ventricular diastolic function could not be evaluated due to atrial  fibrillation.   Right Ventricle: The right ventricular size is moderately enlarged. Right  ventricular systolic function is severely reduced. There is moderately  elevated pulmonary artery systolic pressure. The tricuspid regurgitant  velocity is 3.33 m/s, and with an  assumed right atrial pressure of 15 mmHg, the estimated right ventricular  systolic pressure is 00.8 mmHg.   Left Atrium: Left atrial size was moderately dilated.   Right Atrium: Right atrial size was moderately dilated.   Pericardium: There is no evidence of pericardial effusion.   Mitral Valve: The mitral valve is normal in structure. There is mild  thickening of the mitral valve leaflet(s). Mild mitral annular  calcification. Trivial mitral valve regurgitation.   Tricuspid Valve: The tricuspid valve is normal in structure. Tricuspid  valve regurgitation is mild.   Aortic Valve: The aortic valve is tricuspid. There is mild calcification  of the aortic valve. Aortic valve regurgitation is not visualized. Mild  aortic valve sclerosis is present, with no evidence of aortic valve  stenosis.   Pulmonic Valve: The pulmonic valve was normal in structure. Pulmonic valve  regurgitation is trivial.   Aorta: The aortic root and ascending aorta are structurally  normal, with  no evidence of dilitation.   Venous: The inferior vena cava is dilated in size with less than 50%  respiratory variability, suggesting right atrial pressure of 15 mmHg.   IAS/Shunts: No atrial level shunt detected by color flow Doppler.   R/LHC 08/2020: Assessment: 1. 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 There is a large RV marginal branch that is widely patent 2. EF 60-65% 3. Moderate PAH with elevated PVR and moderately reduced cardiac output   Plan/Discussion:   Will manage CAD medically. Will need w/u for PAH and cor pulmonale .   Diagnostic Dominance: Right      RHC 09/2020: Assessment: 1. Mild pulmonary HTN 2. Normal left-sided filling pressures 3. High cardiac output by Fick (suspect to hyperoxygenation with Bipap). Output normal by thermo 4. No evidence of intracardiac shunting.   Plan/Discussion:   Very mild PAH. Normal to high cardiac output. No evidence of intracardiac shunting. Cannot  exclude peripheral shunting.   Echocardiogram limited bubble study 09/2020: There is no evidence of right to left shunting at rest or with the  Valsalva maneuver.  The right heart chambers are moderately-to-severely dilated and right  ventricular systolic function is severely decreased.      Patient Profile     70 y.o. male  PMH of CAD, paroxysmal atrial fibrillation, right heart failure, PAH with cor pulmonale, COPD, HTN, HLD, DM type 2, and GI bleed  in May transfused 3 units following for atrial fib.   Assessment & Plan    Acute on chronic hypoxemic and hypercapnic resp failure  with pl effusion and RH failure.  - neg 3900 since admit no weights' -on lasix 40 IV BID -echo similar to one done 09/2020 -pl effuison with thoracentesis removed about 2L at bedside  Persistent atrial fib has been in a fib since May when discharged in a fib -on amio-200 mg dialy, but no DCCV until [facility] for 3 weeks at least.    -may be candidate for watchman LAA closure device if he can be stable on AC for 1-2 months  -would avoid long term amio use with underlying lung disease if at all possible.  - on heparin to cover off eliquis for possible thoracentesis. Resume per CCM   PAH 2/2 mod by cath underlying OSA/OHS/hypoxic lung disease  was started on CPAP/Trilogy outpatient and has been tolerating well. -continue sildenafil and cpap  Low BP on midodrine 10 mg TID  CAD  -mostly nonobstructive -no ASA with AC  Anemia per IM Hgb 9.6 down from 11.4   Rt middle lobe spiculated nodule possible primary malignancy         For questions or updates, please contact [facility] Please consult [email or web address] for contact info under        Signed, Cecilie Kicks, NP  12/04/2020, 2:13 PM    Patient seen and examined with Cecilie Kicks, NP.  Agree as above, with the following exceptions and changes as noted below.  Patient is resting but awakens to voice after thoracentesis.  He received Xanax and is slightly sedated still.  Successful thoracentesis performed by pulmonary medicine with 2500 cc of yellowish appearing fluid drained from the right pleural space.  He remains in atrial fibrillation with rates between 80-100.  Gen: NAD, CV: iRRR, no murmurs, Lungs: clear, Abd: soft, Extrem: Warm, well perfused, no edema, Neuro/Psych: alert and oriented x 3, normal mood and affect. All available labs, radiology testing, previous records reviewed.  He has been transition to oral diltiazem which may require titration for best rate control.  Recommend continuing diuresis at this time.  Care discussed with patient's wife at the bedside.  Elouise Munroe, MD 12/04/20 6:14 PM

## 2020-12-04 NOTE — Progress Notes (Signed)
PT Cancellation Note  Patient Details Name: Daryel Kenneth MRN: [id number] DOB: 19-Jan-1951   Cancelled Treatment:    Reason Eval/Treat Not Completed: Patient at procedure or test/unavailable. Patient is getting ready to go to Thoracentesis. Will re-attempt at later time/day.    Desera Graffeo 12/04/2020, 1:57 PM

## 2020-12-04 NOTE — Progress Notes (Signed)
  Echocardiogram 2D Echocardiogram has been performed.  Gary [name] 12/04/2020, 9:56 AM

## 2020-12-04 NOTE — Progress Notes (Signed)
   12/03/20 0832  Assess: MEWS Score  ECG Heart Rate (!) 134  Level of Consciousness Alert  Assess: MEWS Score  MEWS Temp 0  MEWS Systolic 0  MEWS Pulse 3  MEWS RR 2  MEWS LOC 0  MEWS Score 5  MEWS Score Color Red  Assess: if the MEWS score is Yellow or Red  Were vital signs taken at a resting state? Yes  Focused Assessment No change from prior assessment  Early Detection of Sepsis Score *See Row Information* Low  MEWS guidelines implemented *See Row Information* Yes  Treat  MEWS Interventions Administered scheduled meds/treatments  Escalate  MEWS: Escalate Red: discuss with charge nurse/RN and provider, consider discussing with RRT  Notify: Charge Nurse/RN  Name of Charge Nurse/RN Notified Sam RN  Date Charge Nurse/RN Notified 12/03/20  Notify: Provider  Provider Name/Title Dr. Lorelle Gibbs  Date Provider Notified 12/03/20  Time Provider Notified 0900  Notification Type Face-to-face  Notification Reason Other (Comment) (high HR)  Provider response In department  Date of Provider Response 12/03/20  Time of Provider Response 0901  Document  Patient Outcome Stabilized after interventions  Progress note created (see row info) Yes
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
## 2020-12-04 NOTE — Progress Notes (Addendum)
PROGRESS NOTE    Gary [name]  DHR:[id number] DOB: 27-Nov-1950 DOA: 12/02/2020 PCP: Jalene Mullet, PA-C   Chief Complain: Shortness of breath  Brief Narrative: Patient is a 70 year old male with history of diabetes, hypertension, hyperlipidemia, congestive heart failure, COPD on home oxygen/trilogy machine who presented with shortness of breath to the emergency department.  He was recently admitted here from 4/18-5/17 for acute respiratory failure with hypoxia associated with pulmonary hypertension/cor pulmonale and was discharged on home oxygen.  He uses 4 to 5 L of oxygen at home.  On his last hospitalization, hospital course was complicated with upper GI bleed and was intubated to undergo EGD which showed 2 bleeding ulcers, he was also transfused with 3 units of PRBC at the time.  He was also started on anticoagulation for new onset A. fib. CT chest done on this admission showed right pleural effusion, a spiculated mass on the right side, airspace consolidation.  Started on antibiotics.  PCCM consulted, plan is for thoracentesis today.  Still on high flow oxygen.  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  Uses trilogy machine at home at night.  After his last hospitalization, he was discharged on 4-5 L of oxygen per minute. CT of the chest showed moderate right pleural effusion, apparently right middle lobe mass, right airspace consolidation. PCCM planning for thoracentesis today. D/Ced cefepime ,procalcitonin is negative He was on high flow oxygen this morning.  Right lung mass: Patient said he was diagnosed with  right lung mass in 2014 and was in surveillance.  PCCM following, may need bronc/CT chest.  Needs to follow-up pleural fluid cytology  A. fib with RVR:- He was on oral  amiodarone, oral Cardizem at home.  Started on IV Cardizem for persistent A. fib with RVR.  Cardiology also consulted.  Eliquis on hold for further procedures, ordered heparin drip  Acute on chronic right-sided heart failure: Extensive hospitalization recently for cardiac issues, had cath x2.  Takes Demadex at home.  On lasix 40 mg bid IV.  On aspirin, sildenafil Cardiology consulted and following Elevated BNP.  Echo done on this admission showed ejection fraction of 55 to 60%, moderate concentric left ventricular hypertrophy, severely reduced right ventricular systolic function, moderately elevated pulmonary artery systolic pressure.  Hypotension: On midodrine at home.  Continued  Hyperlipidemia: On Lipitor  Microcytic anemia: We will check iron panel.  Hemoglobin currently stable.  Hypokalemia/hypomagnesemia: Being supplemented.  Diabetes type 2: Jardiance, Glucophage at home.  Recent hemoglobin C of 5.2.  Currently on sliding scale insulin.  Significant debility/deconditioning: We will involve PT/OT when appropriate.   Nutrition Problem: Increased nutrient needs Etiology: chronic illness (COPD, CHF)      DVT prophylaxis:Eliquis Code Status: Full Family Communication: Called and discussed with wife on phone today Status is: Inpatient  Remains inpatient appropriate because:Hemodynamically unstable  Dispo: The patient is from: Home              Anticipated d/c is to: Home              Patient currently is not medically stable to d/c.   Difficult to place patient  No     Consultants: PCCM,cardiogy  Procedures:None yet  Antimicrobials:  Anti-infectives (From admission, onward)    Start     Dose/Rate Route Frequency Ordered Stop   12/03/20 1130  ceFEPIme (MAXIPIME) 2 g in sodium chloride 0.9 % 100 mL IVPB         2 g 200 mL/hr over 30 Minutes Intravenous Every 12 hours 12/03/20 1037     12/02/20 1600  vancomycin (VANCOREADY) IVPB 1500 mg/300 mL  Status:  Discontinued        1,500 mg 150 mL/hr over 120 Minutes Intravenous  Once 12/02/20 1524 12/02/20 1633   12/02/20 1415  ceFEPIme (MAXIPIME) 2 g in sodium chloride 0.9 % 100 mL IVPB        2 g 200 mL/hr over 30 Minutes Intravenous  Once 12/02/20 1402 12/02/20 1622       Subjective:  Patient seen and examined the bedside this morning.  Still on high flow oxygen today.  Respiratory status has not worsened from yesterday and he was sitting on the bed, appears mildly dyspneic.  Heart rate is better than yesterday.  Plan for thoracentesis by PCCM today.  Denies any new questions or concerns   Objective: Vitals:   12/04/20 0044 12/04/20 0318 12/04/20 0706 12/04/20 0754  BP: (!) 110/55 (!) 95/59 113/71   Pulse: 66  (!) 104   Resp: 20  20   Temp:  98.4 F (36.9 C) 98.6 F (37 C)   TempSrc:  Oral Oral   SpO2:   93% 94%  Weight:      Height:        Intake/Output Summary (Last 24 hours) at 12/04/2020 0834 Last data filed at 12/04/2020 0500 Gross per 24 hour  Intake --  Output 1450 ml  Net -1450 ml   Filed Weights   12/02/20 1522 12/02/20 1906  Weight: 81.3 kg 79.7 kg    Examination:   General exam: Deconditioned,chronically ill looking,in mild to moderate respiratory distress Respiratory system: Diminished air sound bilaterally more on the right ,no wheezes or crackles  Cardiovascular system: Irregularly irregular rhythm Gastrointestinal system: Abdomen is nondistended, soft and nontender. Central nervous system: Alert and oriented Extremities: No edema, no clubbing ,no cyanosis Skin: No rashes, no ulcers,no icterus    Data Reviewed: I have personally reviewed following labs and imaging studies  CBC: Recent Labs  Lab 12/02/20 1018 12/03/20 0451 12/04/20 0206  WBC 10.4 9.3 10.5  NEUTROABS 8.7*  --  8.5*  HGB 11.4*  10.6* 9.6*  HCT 39.6 35.5* 32.1*  MCV 80.8 78.5* 77.3*  PLT 371 358 357   Basic Metabolic Panel: Recent Labs  Lab 12/02/20 1018 12/03/20 0451 12/04/20 0206  NA 137 141 136  K 4.1 3.7 3.3*  CL 98 101 99  CO2 22 28 30   GLUCOSE 240* 119* 114*  BUN 25* 22 20  CREATININE 1.25* 1.17 0.99  CALCIUM 8.8* 8.5* 8.1*  MG  --   --  1.5*   GFR: Estimated Creatinine Clearance: 68.2 mL/min (by C-G formula based on SCr of 0.99 mg/dL). Liver Function Tests: Recent Labs  Lab 12/02/20 1018  AST 34  ALT 19  ALKPHOS 66  BILITOT 1.1  PROT 7.4  ALBUMIN 3.5   No results for input(s): LIPASE, AMYLASE in the last 168 hours. No results for input(s): AMMONIA in the last 168 hours. Coagulation Profile: No results for input(s): INR, PROTIME in the last 168 hours. Cardiac Enzymes: No results for input(s): CKTOTAL, CKMB, CKMBINDEX,  TROPONINI in the last 168 hours. BNP (last 3 results) No results for input(s): PROBNP in the last 8760 hours. HbA1C: No results for input(s): HGBA1C in the last 72 hours. CBG: Recent Labs  Lab 12/03/20 0850 12/03/20 1146 12/03/20 1615 12/03/20 2030 12/04/20 0705  GLUCAP 180* 167* 163* 224* 140*   Lipid Profile: No results for input(s): CHOL, HDL, LDLCALC, TRIG, CHOLHDL, LDLDIRECT in the last 72 hours. Thyroid Function Tests: No results for input(s): TSH, T4TOTAL, FREET4, T3FREE, THYROIDAB in the last 72 hours. Anemia Panel: No results for input(s): VITAMINB12, FOLATE, FERRITIN, TIBC, IRON, RETICCTPCT in the last 72 hours. Sepsis Labs: Recent Labs  Lab 12/02/20 1018 12/02/20 1228  LATICACIDVEN 3.3* 1.6    Recent Results (from the past 240 hour(s))  Resp Panel by RT-PCR (Flu A&B, Covid) Nasopharyngeal Swab     Status: None   Collection Time: 12/02/20 10:18 AM   Specimen: Nasopharyngeal Swab; Nasopharyngeal(NP) swabs in vial transport medium  Result Value Ref Range Status   SARS Coronavirus 2 by RT PCR NEGATIVE NEGATIVE Final    Comment:  (NOTE) SARS-CoV-2 target nucleic acids are NOT DETECTED.  The SARS-CoV-2 RNA is generally detectable in upper respiratory specimens during the acute phase of infection. The lowest concentration of SARS-CoV-2 viral copies this assay can detect is 138 copies/mL. A negative result does not preclude SARS-Cov-2 infection and should not be used as the sole basis for treatment or other patient management decisions. A negative result may occur with  improper specimen collection/handling, submission of specimen other than nasopharyngeal swab, presence of viral mutation(s) within the areas targeted by this assay, and inadequate number of viral copies(<138 copies/mL). A negative result must be combined with clinical observations, patient history, and epidemiological information. The expected result is Negative.  Fact Sheet for Patients:  EntrepreneurPulse.com.au  Fact Sheet for Healthcare Providers:  IncredibleEmployment.be  This test is no t yet approved or cleared by the [place] FDA and  has been authorized for detection and/or diagnosis of SARS-CoV-2 by FDA under an Emergency Use Authorization (EUA). This EUA will remain  in effect (meaning this test can be used) for the duration of the COVID-19 declaration under Section 564(b)(1) of the Act, 21 U.S.C.section 360bbb-3(b)(1), unless the authorization is terminated  or revoked sooner.       Influenza A by PCR NEGATIVE NEGATIVE Final   Influenza B by PCR NEGATIVE NEGATIVE Final    Comment: (NOTE) The Xpert Xpress SARS-CoV-2/FLU/RSV plus assay is intended as an aid in the diagnosis of influenza from Nasopharyngeal swab specimens and should not be used as a sole basis for treatment. Nasal washings and aspirates are unacceptable for Xpert Xpress SARS-CoV-2/FLU/RSV testing.  Fact Sheet for Patients: EntrepreneurPulse.com.au  Fact Sheet for Healthcare  Providers: IncredibleEmployment.be  This test is not yet approved or cleared by the [place] FDA and has been authorized for detection and/or diagnosis of SARS-CoV-2 by FDA under an Emergency Use Authorization (EUA). This EUA will remain in effect (meaning this test can be used) for the duration of the COVID-19 declaration under Section 564(b)(1) of the Act, 21 U.S.C. section 360bbb-3(b)(1), unless the authorization is terminated or revoked.  Performed at [facility], [place] [address] [place], [place] [address]          Radiology Studies: CT Chest Wo Contrast  Result Date: 12/02/2020 CLINICAL DATA:  Pleural effusion. EXAM: CT CHEST WITHOUT CONTRAST TECHNIQUE: Multidetector CT imaging of the chest was performed following the standard protocol without IV contrast.  COMPARISON:  Sep 20, 2020 FINDINGS: Cardiovascular: Enlarged cardiac silhouette. Calcific atherosclerotic disease of the coronary arteries and aorta. Mediastinum/Nodes: Increasing in mediastinal and right hilar lymphadenopathy. There is narrowing of the right upper and middle lobe bronchi. Lungs/Pleura: Increased in size right pleural effusion which now occupies approximately 50% of the right hemithorax. Previously demonstrated spiculated mass in the anterior aspect of the right middle lobe is obscured by airspace consolidation. Airspace consolidation/atelectasis is also present in most of the right lower lobe. Diffuse hazy airspace opacities persist throughout both lungs. Upper Abdomen: No acute abnormality. Musculoskeletal: No chest wall mass or suspicious bone lesions identified. IMPRESSION: 1. Increased in size right pleural effusion which now occupies approximately 50% of the right hemithorax. 2. Previously demonstrated spiculated mass in the anterior aspect of the right middle lobe is obscured by airspace consolidation. 3. Airspace consolidation/atelectasis in most of the right lower lobe and middle  lobe. 4. Diffuse hazy airspace opacities persist throughout both lungs. 5. Increasing in mediastinal and right hilar lymphadenopathy. 6. Enlarged cardiac silhouette. 7. Calcific atherosclerotic disease of the coronary arteries and aorta. 8. Aortic atherosclerosis. Aortic Atherosclerosis (ICD10-I70.0). Electronically Signed   By: Fidela Salisbury M.D.   On: 12/02/2020 12:36   DG Chest Port 1 View  Result Date: 12/02/2020 CLINICAL DATA:  Short of breath. EXAM: PORTABLE CHEST 1 VIEW COMPARISON:  Radiograph 09/28/2020 FINDINGS: A stable large cardiac silhouette. There is dense opacification of the RIGHT lower lobe which is new from prior. Central venous congestion is present. No pneumothorax. IMPRESSION: Dense opacification of the RIGHT lower lobe consistent with increased RIGHT pleural effusion. Presumably a component of atelectasis in the RIGHT lower lobe. Cannot exclude RIGHT lower lobe infiltrate Electronically Signed   By: Suzy Bouchard M.D.   On: 12/02/2020 10:53        Scheduled Meds:  amiodarone  200 mg Oral Daily   aspirin EC  81 mg Oral q morning   atorvastatin  40 mg Oral QPM   busPIRone  5 mg Oral BID   docusate sodium  100 mg Oral BID   feeding supplement  237 mL Oral BID BM   ferrous sulfate  325 mg Oral Q breakfast   fluticasone furoate-vilanterol  1 puff Inhalation Daily   furosemide  40 mg Intravenous BID   insulin aspart  0-15 Units Subcutaneous TID WC   insulin aspart  0-5 Units Subcutaneous QHS   midodrine  10 mg Oral TID WC   pantoprazole  40 mg Oral Daily   polyethylene glycol  17 g Oral Daily   potassium chloride  40 mEq Oral Once   sildenafil  40 mg Oral TID   sodium chloride flush  3 mL Intravenous Q12H   umeclidinium bromide  1 puff Inhalation Daily   Continuous Infusions:  ceFEPime (MAXIPIME) IV 2 g (12/03/20 2252)   diltiazem (CARDIZEM) infusion Stopped (12/03/20 2349)   heparin 1,200 Units/hr (12/03/20 1743)     LOS: 2 days    Time spent: 35  mins.More than 50% of that time was spent in counseling and/or coordination of care.      Shelly Coss, MD [facility] P7/19/2022, 8:34 AM

## 2020-12-04 NOTE — Progress Notes (Signed)
ANTICOAGULATION CONSULT NOTE - Follow Up Consult  Pharmacy Consult for Heparin Indication: atrial fibrillation  Allergies  Allergen Reactions   Novocain [Procaine] Other (See Comments)    Unknown reaction as a young child    Patient Measurements: Height: 5' 7"  (170.2 cm) Weight: 79.7 kg (175 lb 11.3 oz) IBW/kg (Calculated) : 66.1 Heparin Dosing Weight: TBW  Vital Signs: Temp: 98.5 F (36.9 C) (07/18 2033) Temp Source: Oral (07/18 2033) BP: 110/55 (07/19 0044) Pulse Rate: 66 (07/19 0044)  Labs: Recent Labs    12/02/20 1018 12/02/20 1232 12/03/20 0451 12/04/20 0206  HGB 11.4*  --  10.6* 9.6*  HCT 39.6  --  35.5* 32.1*  PLT 371  --  358 308  APTT  --   --   --  76*  HEPARINUNFRC  --   --   --  >1.10*  CREATININE 1.25*  --  1.17 0.99  TROPONINIHS 27* 27*  --   --     Estimated Creatinine Clearance: 68.2 mL/min (by C-G formula based on SCr of 0.99 mg/dL).   Medications:  Scheduled:   amiodarone  200 mg Oral Daily   aspirin EC  81 mg Oral q morning   atorvastatin  40 mg Oral QPM   busPIRone  5 mg Oral BID   docusate sodium  100 mg Oral BID   feeding supplement  237 mL Oral BID BM   ferrous sulfate  325 mg Oral Q breakfast   fluticasone furoate-vilanterol  1 puff Inhalation Daily   furosemide  40 mg Intravenous BID   insulin aspart  0-15 Units Subcutaneous TID WC   insulin aspart  0-5 Units Subcutaneous QHS   midodrine  10 mg Oral TID WC   pantoprazole  40 mg Oral Daily   polyethylene glycol  17 g Oral Daily   sildenafil  40 mg Oral TID   sodium chloride flush  3 mL Intravenous Q12H   umeclidinium bromide  1 puff Inhalation Daily   Infusions:   ceFEPime (MAXIPIME) IV 2 g (12/03/20 2252)   diltiazem (CARDIZEM) infusion Stopped (12/03/20 2349)   heparin 1,200 Units/hr (12/03/20 1743)    Assessment: 70 y.o. M on Eliquis PTA for afib. Last dose 7/17 0700. Eliquis being held and heparin bridge. Planning for thoracentesis 7/19.  Eliquis affecting heparin  level (>1.1). aPTT 76 sec (therapeutic) on gtt at 1200 units/hr.  Goal of Therapy:  Heparin level 0.3-0.7 units/ml, aPTT 66-102 sec Monitor platelets by anticoagulation protocol: Yes   Plan:  Continue heparin at 1200 units/hr - noted to stop today at 1100 for thoracentesis F/u post thoracentesis  Sherlon Handing, PharmD, BCPS Please see amion for complete clinical pharmacist phone list 12/04/2020,3:14 AM

## 2020-12-04 NOTE — Procedures (Signed)
Thoracentesis  Procedure Note  Gary [name]  [id number]  February 28, 1951  Date:12/04/20  Time:2:54 PM   Provider Performing:Sie Formisano   Procedure: Thoracentesis with imaging guidance (70141)  Indication(s) Pleural Effusion  Consent Risks of the procedure as well as the alternatives and risks of each were explained to the patient and/or caregiver.  Consent for the procedure was obtained and is signed in the bedside chart  Anesthesia Topical only with 1% lidocaine    Time Out Verified patient identification, verified procedure, site/side was marked, verified correct patient position, special equipment/implants available, medications/allergies/relevant history reviewed, required imaging and test results available.   Sterile Technique Maximal sterile technique including full sterile barrier drape, hand hygiene, sterile gown, sterile gloves, mask, hair covering, sterile ultrasound probe cover (if used).  Procedure Description Ultrasound was used to identify appropriate pleural anatomy for placement and overlying skin marked.  Area of drainage cleaned and draped in sterile fashion. Lidocaine was used to anesthetize the skin and subcutaneous tissue.  2500 cc's of yellowish appearing fluid was drained from the right pleural space. Catheter then removed and bandaid applied to site.   Complications/Tolerance None; patient tolerated the procedure well. Chest X-ray is ordered to confirm no post-procedural complication.   EBL Minimal   Specimen(s) Pleural fluid

## 2020-12-04 NOTE — Plan of Care (Signed)

## 2020-12-04 NOTE — Progress Notes (Signed)
   12/02/20 1906  Assess: MEWS Score  Temp 97.9 F (36.6 C)  BP 117/73  Pulse Rate (!) 133  ECG Heart Rate (!) 133  Resp (!) 36  SpO2 95 %  O2 Device Non-rebreather Mask  Assess: MEWS Score  MEWS Temp 0  MEWS Systolic 0  MEWS Pulse 3  MEWS RR 3  MEWS LOC 0  MEWS Score 6  MEWS Score Color Red  Assess: if the MEWS score is Yellow or Red  Were vital signs taken at a resting state? Yes  Focused Assessment No change from prior assessment  Early Detection of Sepsis Score *See Row Information* High  MEWS guidelines implemented *See Row Information* Yes  Treat  MEWS Interventions Escalated (See documentation below);Consulted Respiratory Therapy  Pain Scale 0-10  Pain Score 0  Take Vital Signs  Increase Vital Sign Frequency  Red: Q 1hr X 4 then Q 4hr X 4, if remains red, continue Q 4hrs  Escalate  MEWS: Escalate Red: discuss with charge nurse/RN and provider, consider discussing with RRT  Notify: Charge Nurse/RN  Name of Charge Nurse/RN Notified  Firefighter, RN )  Date Charge Nurse/RN Notified 12/02/20  Time Charge Nurse/RN Notified 1913  Notify: Rapid Response  Name of Rapid Response RN Notified yes  Date Rapid Response Notified  Saralyn Pilar, RN )  Time Rapid Response Notified 1517  Document  Patient Outcome Other (Comment) (See notes. )  Progress note created (see row info) Yes

## 2020-12-05 DIAGNOSIS — J9621 Acute and chronic respiratory failure with hypoxia: Secondary | ICD-10-CM | POA: Diagnosis not present

## 2020-12-05 DIAGNOSIS — J90 Pleural effusion, not elsewhere classified: Secondary | ICD-10-CM | POA: Diagnosis not present

## 2020-12-05 DIAGNOSIS — R918 Other nonspecific abnormal finding of lung field: Secondary | ICD-10-CM

## 2020-12-05 DIAGNOSIS — J9622 Acute and chronic respiratory failure with hypercapnia: Secondary | ICD-10-CM | POA: Diagnosis not present

## 2020-12-05 DIAGNOSIS — I4819 Other persistent atrial fibrillation: Secondary | ICD-10-CM | POA: Diagnosis not present

## 2020-12-05 DIAGNOSIS — I509 Heart failure, unspecified: Secondary | ICD-10-CM | POA: Diagnosis not present

## 2020-12-05 LAB — HEPARIN LEVEL (UNFRACTIONATED)
Heparin Unfractionated: 0.81 IU/mL — ABNORMAL HIGH (ref 0.30–0.70)
Heparin Unfractionated: 0.87 IU/mL — ABNORMAL HIGH (ref 0.30–0.70)

## 2020-12-05 LAB — GLUCOSE, CAPILLARY
Glucose-Capillary: 325 mg/dL — ABNORMAL HIGH (ref 70–99)
Glucose-Capillary: 224 mg/dL — ABNORMAL HIGH (ref 70–99)
Glucose-Capillary: 137 mg/dL — ABNORMAL HIGH (ref 70–99)
Glucose-Capillary: 154 mg/dL — ABNORMAL HIGH (ref 70–99)
Glucose-Capillary: 174 mg/dL — ABNORMAL HIGH (ref 70–99)

## 2020-12-05 LAB — BASIC METABOLIC PANEL
Anion gap: 10 (ref 5–15)
BUN: 18 mg/dL (ref 8–23)
CO2: 28 mmol/L (ref 22–32)
Calcium: 8 mg/dL — ABNORMAL LOW (ref 8.9–10.3)
Chloride: 97 mmol/L — ABNORMAL LOW (ref 98–111)
Creatinine, Ser: 1.2 mg/dL (ref 0.61–1.24)
GFR, Estimated: >60 mL/min (ref >60)
Glucose, Bld: 141 mg/dL — ABNORMAL HIGH (ref 70–99)
Potassium: 3.5 mmol/L (ref 3.5–5.1)
Sodium: 135 mmol/L (ref 135–145)

## 2020-12-05 LAB — CBC
HCT: 31.6 % — ABNORMAL LOW (ref 39.0–52.0)
Hemoglobin: 9.3 g/dL — ABNORMAL LOW (ref 13.0–17.0)
MCH: 22.9 pg — ABNORMAL LOW (ref 26.0–34.0)
MCHC: 29.4 g/dL — ABNORMAL LOW (ref 30.0–36.0)
MCV: 77.8 fL — ABNORMAL LOW (ref 80.0–100.0)
Platelets: 331 10*3/uL (ref 150–400)
RBC: 4.06 MIL/uL — ABNORMAL LOW (ref 4.22–5.81)
RDW: 18.6 % — ABNORMAL HIGH (ref 11.5–15.5)
WBC: 10 10*3/uL (ref 4.0–10.5)
nRBC: 0 % (ref 0.0–0.2)

## 2020-12-05 LAB — APTT
aPTT: 48 seconds — ABNORMAL HIGH (ref 24–36)
aPTT: 46 seconds — ABNORMAL HIGH (ref 24–36)
aPTT: 65 seconds — ABNORMAL HIGH (ref 24–36)

## 2020-12-05 LAB — MAGNESIUM
Magnesium: 1.7 mg/dL (ref 1.7–2.4)
Magnesium: 1.6 mg/dL — ABNORMAL LOW (ref 1.7–2.4)

## 2020-12-05 LAB — POTASSIUM: Potassium: 3.7 mmol/L (ref 3.5–5.1)

## 2020-12-05 LAB — CYTOLOGY - NON PAP

## 2020-12-05 MED ORDER — MAGNESIUM SULFATE IN D5W 1-5 GM/100ML-% IV SOLN
1.0000 g | Freq: Once | INTRAVENOUS | Status: AC
  Administered 2020-12-06: 1 g via INTRAVENOUS
  Filled 2020-12-05: qty 100

## 2020-12-05 MED ORDER — LACTATED RINGERS IV BOLUS
500.0000 mL | Freq: Once | INTRAVENOUS | Status: AC
  Administered 2020-12-05: 500 mL via INTRAVENOUS

## 2020-12-05 MED ORDER — METHYLPREDNISOLONE SODIUM SUCC 125 MG IJ SOLR
60.0000 mg | INTRAMUSCULAR | Status: DC
  Administered 2020-12-05 – 2020-12-06 (×2): 60 mg via INTRAVENOUS
  Filled 2020-12-05 (×2): qty 2

## 2020-12-05 NOTE — Progress Notes (Signed)
ANTICOAGULATION CONSULT NOTE - Follow Up Consult  Pharmacy Consult for Heparin Indication: atrial fibrillation  Allergies  Allergen Reactions   Novocain [Procaine] Other (See Comments)    Unknown reaction as a young child    Patient Measurements: Height: 5' 7"  (170.2 cm) Weight: 79.7 kg (175 lb 11.3 oz) IBW/kg (Calculated) : 66.1 Heparin Dosing Weight: TBW  Vital Signs: Temp: 99.1 F (37.3 C) (07/20 0300) Temp Source: Axillary (07/20 0300) BP: 97/61 (07/20 1100) Pulse Rate: 90 (07/20 0300)  Labs: Recent Labs    12/02/20 1232 12/03/20 0451 12/03/20 0451 12/04/20 0206 12/05/20 0010 12/05/20 1003  HGB  --  10.6*   < > 9.6* 9.3*  --   HCT  --  35.5*  --  32.1* 31.6*  --   PLT  --  358  --  308 331  --   APTT  --   --   --  76* 48* 46*  HEPARINUNFRC  --   --   --  >1.10* 0.87* 0.81*  CREATININE  --  1.17  --  0.99 1.20  --   TROPONINIHS 27*  --   --   --   --   --    < > = values in this interval not displayed.     Estimated Creatinine Clearance: 56.3 mL/min (by C-G formula based on SCr of 1.2 mg/dL).   Medications:  Scheduled:   amiodarone  200 mg Oral Daily   aspirin EC  81 mg Oral q morning   atorvastatin  40 mg Oral QPM   busPIRone  5 mg Oral BID   diltiazem  30 mg Oral Q6H   docusate sodium  100 mg Oral BID   feeding supplement  237 mL Oral BID BM   ferrous sulfate  325 mg Oral Q breakfast   fluticasone furoate-vilanterol  1 puff Inhalation Daily   furosemide  40 mg Intravenous BID   insulin aspart  0-15 Units Subcutaneous TID WC   insulin aspart  0-5 Units Subcutaneous QHS   midodrine  10 mg Oral TID WC   pantoprazole  40 mg Oral Daily   polyethylene glycol  17 g Oral Daily   sildenafil  40 mg Oral TID   sodium chloride flush  3 mL Intravenous Q12H   umeclidinium bromide  1 puff Inhalation Daily   Infusions:   heparin 1,300 Units/hr (12/05/20 6808)    Assessment: 70 y.o. M on Eliquis PTA for afib. Last dose 7/17 0700. Eliquis being held and  heparin bridge. S/p thoracentesis 7/19 for which heparin was briefly held  Eliquis affecting heparin level (0.81). aPTT down to 46 sec (therapeutic) on gtt at 1300 units/hr.    Goal of Therapy:  Heparin level 0.3-0.7 units/ml aPTT 66-102 secs Monitor platelets by anticoagulation protocol: Yes   Plan:  Inc heparin to 1450 units/hr F/u 8 hr aPTT  Monitor daily HL and aPTT   Albertina Parr, PharmD., BCPS, BCCCP Clinical Pharmacist Please refer to [facility] for unit-specific pharmacist

## 2020-12-05 NOTE — Progress Notes (Signed)
ANTICOAGULATION CONSULT NOTE - Follow Up Consult  Pharmacy Consult for Heparin Indication: atrial fibrillation  Allergies  Allergen Reactions   Novocain [Procaine] Other (See Comments)    Unknown reaction as a young child    Patient Measurements: Height: 5' 7"  (170.2 cm) Weight: 79.7 kg (175 lb 11.3 oz) IBW/kg (Calculated) : 66.1 Heparin Dosing Weight: TBW  Vital Signs: Temp: 98.6 F (37 C) (07/19 2338) Temp Source: Axillary (07/19 2338) BP: 98/44 (07/19 2338) Pulse Rate: 78 (07/19 2338)  Labs: Recent Labs    12/02/20 1018 12/02/20 1232 12/03/20 0451 12/04/20 0206 12/05/20 0010  HGB 11.4*  --  10.6* 9.6* 9.3*  HCT 39.6  --  35.5* 32.1* 31.6*  PLT 371  --  358 308 331  APTT  --   --   --  76* 48*  HEPARINUNFRC  --   --   --  >1.10* 0.87*  CREATININE 1.25*  --  1.17 0.99 1.20  TROPONINIHS 27* 27*  --   --   --      Estimated Creatinine Clearance: 56.3 mL/min (by C-G formula based on SCr of 1.2 mg/dL).   Medications:  Scheduled:   amiodarone  200 mg Oral Daily   aspirin EC  81 mg Oral q morning   atorvastatin  40 mg Oral QPM   busPIRone  5 mg Oral BID   diltiazem  30 mg Oral Q6H   docusate sodium  100 mg Oral BID   feeding supplement  237 mL Oral BID BM   ferrous sulfate  325 mg Oral Q breakfast   fluticasone furoate-vilanterol  1 puff Inhalation Daily   furosemide  40 mg Intravenous BID   insulin aspart  0-15 Units Subcutaneous TID WC   insulin aspart  0-5 Units Subcutaneous QHS   midodrine  10 mg Oral TID WC   pantoprazole  40 mg Oral Daily   polyethylene glycol  17 g Oral Daily   sildenafil  40 mg Oral TID   sodium chloride flush  3 mL Intravenous Q12H   umeclidinium bromide  1 puff Inhalation Daily   Infusions:   heparin 1,200 Units/hr (12/04/20 1815)    Assessment: 70 y.o. M on Eliquis PTA for afib. Last dose 7/17 0700. Eliquis being held and heparin bridge. Planning for thoracentesis 7/19.  Eliquis affecting heparin level (>1.1). aPTT 76 sec  (therapeutic) on gtt at 1200 units/hr.  7/20 AM update:  aPTT low after re-start s/p procedure  Goal of Therapy:  Heparin level 0.3-0.7 units/ml aPTT 66-102 secs Monitor platelets by anticoagulation protocol: Yes   Plan:  Inc heparin to 1300 units/hr 1000 aPTT and heparin level  Narda Bonds, PharmD, BCPS Clinical Pharmacist Phone: [phone]

## 2020-12-05 NOTE — Progress Notes (Signed)
PROGRESS NOTE    Narayan Scull  WJX:[id number] DOB: 12/25/1950 DOA: 12/02/2020 PCP: Jalene Mullet, PA-C   Brief Narrative: Gary [name] is a 70 y.o. male with history of diabetes, hypertension, hyperlipidemia, congestive heart failure, COPD on home oxygen and trilogy machine for chronic respiratory failure with hypoxia and hypercapnia.  Patient presented secondary to shortness of breath with evidence of acute on chronic respiratory failure.  Patient required high flow nasal cannula.  Hospitalization complicated by acute on chronic right heart failure, atrial fibrillation with RVR, worsening of underlying lung disease.  Patient is currently on diuresis and has received a right thoracentesis to help with a large right pleural effusion.  Patient also noted to have a concerning right middle lobe mass.  Attempting to wean patient's oxygen use.    Assessment & Plan:   Principal Problem:   Acute on chronic respiratory failure with hypoxia (HCC) Active Problems:   Hyperlipidemia due to type 2 diabetes mellitus (HCC)   Atrial fibrillation (HCC)   Acute on chronic right heart failure (HCC)   COPD with hypoxia (HCC)   Lung mass   Acute on chronic respiratory failure with hypoxia and hypercapnia COPD CT chest significant for right pleural effusion, right middle lobe mass and right airspace consolidation. Thoracentesis performed on 7/19 significant for likely exudative fluid; culture with no growth to date.  Patient reports feeling a little better since the thoracentesis but still requiring 15 Lpm. Patient was empirically treated with cefepime IV which was discontinued after negative procalcitonin. -Wean oxygen to home 4-5 Lpm -Pulmonology recommendations: Continue bronchodilators -Continue Incruse Ellipta, Breo Ellipta  Right lung mass Associated mediastinal adenopathy.  Cytology is pending from pleural effusion.  Unable to biopsy secondary to severe hypoxia. -Follow-up cytology from  pleural effusion  Atrial fibrillation with RVR Patient is on Eliquis, amiodarone and Cardizem as an outpatient. Started on Cardizem IV and now transitioned to PO. Eliquis on hold secondary to procedures and patient started on heparin drip. Cardiology consulted for management -Cardiology recommendations: Pending today  Acute on chronic right-sided heart failure Patient is on Demadex as an outpatient. Currently on Lasix -Continue Lasix -Cardiology recommendations: Pending today  Pulmonary artery hypertension OSA/OHS -Continue Revatio -Continue trilogy nightly  Hypotension Chronic. Patient is on midodrine as an outpatient. Complicated by above problems.  Hyperlipidemia -Continue Lipitor  Microcytic anemia Patient is on iron supplementation as an outpatient.  Hypokalemia Hypomagnesemia Replete as needed.  Diabetes mellitus, type 2 Hemoglobin A1C of 5.2%. Patient is on Jardiance, metformin as an outpatient. Started on SSI inpatient. -Continue SSI  DVT prophylaxis: Heparin IV Code Status:   Code Status: Full Code Family Communication: None at bedside Disposition Plan: Discharge home versus SNF pending ability to wean oxygen, continue cardiology and pulmonology recommendations   Consultants:  Pulmonology Cardiology  Procedures:    Antimicrobials: Cefepime IV   Subjective: Patient reports breathing little better today compared to yesterday after his thoracentesis.  No chest pain.  Currently without dyspnea at rest.  Objective: Vitals:   12/05/20 0800 12/05/20 1100 12/05/20 1700 12/05/20 1954  BP:  97/61 (!) 90/41 (!) 96/53  Pulse:    74  Resp:    17  Temp:    99.5 F (37.5 C)  TempSrc:    Oral  SpO2: 100%   99%  Weight:      Height:        Intake/Output Summary (Last 24 hours) at 12/05/2020 2040 Last data filed at 12/05/2020 1400 Gross per 24  hour  Intake 480 ml  Output 1800 ml  Net -1320 ml   Filed Weights   12/02/20 1522 12/02/20 1906  Weight: 81.3  kg 79.7 kg    Examination:  General exam: Appears calm and comfortable Respiratory system: Rales.  Respiratory effort normal. Cardiovascular system: S1 & S2 heard, RRR. No murmurs, rubs, gallops or clicks. Gastrointestinal system: Abdomen is nondistended, soft and nontender. Normal bowel sounds heard. Central nervous system: Alert and oriented. No focal neurological deficits. Musculoskeletal: No calf tenderness Skin: No cyanosis. No rashes Psychiatry: Judgement and insight appear normal. Mood & affect appropriate.     Data Reviewed: I have personally reviewed following labs and imaging studies  CBC Lab Results  Component Value Date   WBC 10.0 12/05/2020   RBC 4.06 (L) 12/05/2020   HGB 9.3 (L) 12/05/2020   HCT 31.6 (L) 12/05/2020   MCV 77.8 (L) 12/05/2020   MCH 22.9 (L) 12/05/2020   PLT 331 12/05/2020   MCHC 29.4 (L) 12/05/2020   RDW 18.6 (H) 12/05/2020   LYMPHSABS 0.9 12/04/2020   MONOABS 1.0 12/04/2020   EOSABS 0.1 12/04/2020   BASOSABS 0.0 38/46/6599     Last metabolic panel Lab Results  Component Value Date   NA 135 12/05/2020   K 3.5 12/05/2020   CL 97 (L) 12/05/2020   CO2 28 12/05/2020   BUN 18 12/05/2020   CREATININE 1.20 12/05/2020   GLUCOSE 141 (H) 12/05/2020   GFRNONAA >60 12/05/2020   GFRAA 80 (L) 05/14/2013   CALCIUM 8.0 (L) 12/05/2020   PROT 7.4 12/02/2020   ALBUMIN 3.5 12/02/2020   BILITOT 1.1 12/02/2020   ALKPHOS 66 12/02/2020   AST 34 12/02/2020   ALT 19 12/02/2020   ANIONGAP 10 12/05/2020    CBG (last 3)  Recent Labs    12/05/20 1138 12/05/20 1623 12/05/20 1920  GLUCAP 224* 137* 154*     GFR: Estimated Creatinine Clearance: 56.3 mL/min (by C-G formula based on SCr of 1.2 mg/dL).  Coagulation Profile: No results for input(s): INR, PROTIME in the last 168 hours.  Recent Results (from the past 240 hour(s))  Resp Panel by RT-PCR (Flu A&B, Covid) Nasopharyngeal Swab     Status: None   Collection Time: 12/02/20 10:18 AM    Specimen: Nasopharyngeal Swab; Nasopharyngeal(NP) swabs in vial transport medium  Result Value Ref Range Status   SARS Coronavirus 2 by RT PCR NEGATIVE NEGATIVE Final    Comment: (NOTE) SARS-CoV-2 target nucleic acids are NOT DETECTED.  The SARS-CoV-2 RNA is generally detectable in upper respiratory specimens during the acute phase of infection. The lowest concentration of SARS-CoV-2 viral copies this assay can detect is 138 copies/mL. A negative result does not preclude SARS-Cov-2 infection and should not be used as the sole basis for treatment or other patient management decisions. A negative result may occur with  improper specimen collection/handling, submission of specimen other than nasopharyngeal swab, presence of viral mutation(s) within the areas targeted by this assay, and inadequate number of viral copies(<138 copies/mL). A negative result must be combined with clinical observations, patient history, and epidemiological information. The expected result is Negative.  Fact Sheet for Patients:  EntrepreneurPulse.com.au  Fact Sheet for Healthcare Providers:  IncredibleEmployment.be  This test is no t yet approved or cleared by the [place] FDA and  has been authorized for detection and/or diagnosis of SARS-CoV-2 by FDA under an Emergency Use Authorization (EUA). This EUA will remain  in effect (meaning this test can be used) for the  duration of the COVID-19 declaration under Section 564(b)(1) of the Act, 21 U.S.C.section 360bbb-3(b)(1), unless the authorization is terminated  or revoked sooner.       Influenza A by PCR NEGATIVE NEGATIVE Final   Influenza B by PCR NEGATIVE NEGATIVE Final    Comment: (NOTE) The Xpert Xpress SARS-CoV-2/FLU/RSV plus assay is intended as an aid in the diagnosis of influenza from Nasopharyngeal swab specimens and should not be used as a sole basis for treatment. Nasal washings and aspirates are  unacceptable for Xpert Xpress SARS-CoV-2/FLU/RSV testing.  Fact Sheet for Patients: EntrepreneurPulse.com.au  Fact Sheet for Healthcare Providers: IncredibleEmployment.be  This test is not yet approved or cleared by the [place] FDA and has been authorized for detection and/or diagnosis of SARS-CoV-2 by FDA under an Emergency Use Authorization (EUA). This EUA will remain in effect (meaning this test can be used) for the duration of the COVID-19 declaration under Section 564(b)(1) of the Act, 21 U.S.C. section 360bbb-3(b)(1), unless the authorization is terminated or revoked.  Performed at [facility], [place] [address], [place], [place] [address]   Body fluid culture w Gram Stain     Status: None (Preliminary result)   Collection Time: 12/04/20  2:58 PM   Specimen: Body Fluid  Result Value Ref Range Status   Specimen Description FLUID RIGHT PLEURAL  Final   Special Requests NONE  Final   Gram Stain   Final    FEW WBC PRESENT, PREDOMINANTLY MONONUCLEAR NO ORGANISMS SEEN    Culture   Final    NO GROWTH < 24 HOURS Performed at [facility], [address] [place], [place] [address]    Report Status PENDING  Incomplete        Radiology Studies: CT CHEST WO CONTRAST  Result Date: 12/04/2020 CLINICAL DATA:  Dyspnea, hypoxia, pulmonary nodule EXAM: CT CHEST WITHOUT CONTRAST TECHNIQUE: Multidetector CT imaging of the chest was performed following the standard protocol without IV contrast. COMPARISON:  12/02/2020, 09/20/2020 FINDINGS: Cardiovascular: Extensive multi-vessel coronary artery calcification. Mild global cardiomegaly. No pericardial effusion. Central pulmonary arteries are enlarged in keeping with changes of pulmonary arterial hypertension. The thoracic aorta is of normal caliber. Moderate atherosclerotic calcification noted throughout the thoracic aorta. Mediastinum/Nodes: Visualized thyroid is unremarkable. There is  extensive mediastinal adenopathy which appears grossly stable since prior examination right hilar adenopathy is also suspected though is difficult to clearly delineate in absence of contrast administration. Precarinal lymph node measures 1.9 x 2.8 cm in greatest dimension, axial # 61. The esophagus is unremarkable. Lungs/Pleura: Interval right thoracentesis. Small pleural fluid persists. Improved aeration of the a right middle and lower lobes with extensive multifocal consolidation persisting in these regions, possibly reflective of superimposed atypical infectious process. There is, additionally, progressive ground-glass infiltrate within the left upper lobe and patchy infiltrate within the right upper lobe. As noted previously, there is engorgement of the central pulmonary vasculature as well as ground-glass pulmonary infiltrate within the lung bases bilaterally as noted interlobular septal thickening suggesting at least some degree of superimposed pulmonary edema, slightly improved since prior examination. No pneumothorax. Central airways are widely patent. Known spiculated mass within the right middle lobe is obscured by multifocal consolidation within this region. Upper Abdomen: No acute abnormality. Musculoskeletal: No acute bone abnormality. Osseous structures are age-appropriate. No lytic or blastic bone lesion. IMPRESSION: Interval right thoracentesis. Small residual pleural fluid. Improved aeration of the right lung base. Findings in keeping with moderate pulmonary edema, improved since prior examination. Superimposed progressive  multifocal consolidation within the right lung base and airspace infiltrate within the upper lung zones bilaterally suggestive of a superimposed atypical infectious or inflammatory process. No central obstructing lesion. Stable mediastinal adenopathy. Extensive multi-vessel coronary artery calcification. Morphologic changes in keeping with pulmonary arterial hypertension. Stable  cardiomegaly. Aortic Atherosclerosis (ICD10-I70.0). Electronically Signed   By: Fidela Salisbury MD   On: 12/04/2020 20:43   DG CHEST PORT 1 VIEW  Result Date: 12/04/2020 CLINICAL DATA:  Right thoracentesis, cough, short of breath EXAM: PORTABLE CHEST 1 VIEW COMPARISON:  12/02/2020 FINDINGS: Single frontal view of the chest demonstrates an enlarged cardiac silhouette. Persistent central vascular congestion and interstitial prominence. Decreased right pleural effusion after thoracentesis. Persistent consolidation at the right lung base consistent with atelectasis. No pneumothorax. No acute bony abnormalities. IMPRESSION: 1. No complication after right thoracentesis, with small residual right pleural effusion identified. 2. Stable right basilar atelectasis. 3. Continued central vascular congestion and interstitial prominence compatible with volume overload. Electronically Signed   By: Randa Ngo M.D.   On: 12/04/2020 18:10   ECHOCARDIOGRAM LIMITED  Result Date: 12/04/2020    ECHOCARDIOGRAM LIMITED REPORT   Patient Name:   Gary [name] Date of Exam: 12/04/2020 Medical Rec #:  [id number]          Height:       67.0 in Accession #:    [id number]         Weight:       175.7 lb Date of Birth:  [birth year]          BSA:          1.914 m Patient Age:    48 years           BP:           113/71 mmHg Patient Gender: M                  HR:           101 bpm. Exam Location:  Inpatient Procedure: Limited Echo, Cardiac Doppler, Color Doppler and 3D Echo Indications:    R06.02 SOB  History:        Patient has prior history of Echocardiogram examinations, most                 recent 09/25/2020. Abnormal ECG, COPD, Arrythmias:Atrial Flutter,                 Atrial Fibrillation and RBBB, Signs/Symptoms:Shortness of Breath                 and Dyspnea; Risk Factors:Dyslipidemia. RV failure.  Sonographer:    Smock Referring Phys: [id number] Abigail Butts  Sonographer Comments: Patient in high fowler's position due to  hypoxia. Pateint destatted to 68 just moving from chair to bed for echo. IMPRESSIONS  1. Left ventricular ejection fraction, by estimation, is 55 to 60%. The left ventricle has normal function. The left ventricle has no regional wall motion abnormalities. There is moderate concentric left ventricular hypertrophy. Left ventricular diastolic function could not be evaluated. There is the interventricular septum is flattened in systole and diastole, consistent with right ventricular pressure and volume overload.  2. Right ventricular systolic function is severely reduced. The right ventricular size is moderately enlarged. There is moderately elevated pulmonary artery systolic pressure. The estimated right ventricular systolic pressure is 74.1 mmHg.  3. Left atrial size was moderately dilated.  4. Right atrial size was moderately dilated.  5. The mitral valve  is normal in structure. Trivial mitral valve regurgitation.  6. The aortic valve is tricuspid. There is mild calcification of the aortic valve. Aortic valve regurgitation is not visualized. Mild aortic valve sclerosis is present, with no evidence of aortic valve stenosis.  7. The inferior vena cava is dilated in size with <50% respiratory variability, suggesting right atrial pressure of 15 mmHg. Comparison(s): No significant change from prior study. Prior images reviewed side by side. FINDINGS  Left Ventricle: Left ventricular ejection fraction, by estimation, is 55 to 60%. The left ventricle has normal function. The left ventricle has no regional wall motion abnormalities. The left ventricular internal cavity size was normal in size. There is  moderate concentric left ventricular hypertrophy. The interventricular septum is flattened in systole and diastole, consistent with right ventricular pressure and volume overload. Left ventricular diastolic function could not be evaluated. Left ventricular diastolic function could not be evaluated due to atrial fibrillation.  Right Ventricle: The right ventricular size is moderately enlarged. Right ventricular systolic function is severely reduced. There is moderately elevated pulmonary artery systolic pressure. The tricuspid regurgitant velocity is 3.33 m/s, and with an assumed right atrial pressure of 15 mmHg, the estimated right ventricular systolic pressure is 03.5 mmHg. Left Atrium: Left atrial size was moderately dilated. Right Atrium: Right atrial size was moderately dilated. Pericardium: There is no evidence of pericardial effusion. Mitral Valve: The mitral valve is normal in structure. There is mild thickening of the mitral valve leaflet(s). Mild mitral annular calcification. Trivial mitral valve regurgitation. Tricuspid Valve: The tricuspid valve is normal in structure. Tricuspid valve regurgitation is mild. Aortic Valve: The aortic valve is tricuspid. There is mild calcification of the aortic valve. Aortic valve regurgitation is not visualized. Mild aortic valve sclerosis is present, with no evidence of aortic valve stenosis. Pulmonic Valve: The pulmonic valve was normal in structure. Pulmonic valve regurgitation is trivial. Aorta: The aortic root and ascending aorta are structurally normal, with no evidence of dilitation. Venous: The inferior vena cava is dilated in size with less than 50% respiratory variability, suggesting right atrial pressure of 15 mmHg. IAS/Shunts: No atrial level shunt detected by color flow Doppler. Additional Comments: There is pleural effusion in the right lateral region. LEFT VENTRICLE PLAX 2D LVIDd:         3.50 cm LVIDs:         2.60 cm LV PW:         1.60 cm LV IVS:        1.40 cm LVOT diam:     1.90 cm LVOT Area:     2.84 cm  LV Volumes (MOD) LV vol d, MOD A2C: 88.8 ml LV vol d, MOD A4C: 59.3 ml LV vol s, MOD A2C: 38.6 ml LV vol s, MOD A4C: 28.4 ml LV SV MOD A2C:     50.2 ml LV SV MOD A4C:     59.3 ml LV SV MOD BP:      41.1 ml IVC IVC diam: 2.40 cm LEFT ATRIUM         Index LA diam:    4.60 cm  2.40 cm/m   AORTA Ao Root diam: 3.00 cm Ao Asc diam:  3.90 cm TRICUSPID VALVE TR Peak grad:   44.4 mmHg TR Vmax:        333.00 cm/s  SHUNTS Systemic Diam: 1.90 cm Dani [place] Croitoru MD Electronically signed by Sanda Klein MD Signature Date/Time: 12/04/2020/10:04:05 AM    Final  Scheduled Meds:  amiodarone  200 mg Oral Daily   aspirin EC  81 mg Oral q morning   atorvastatin  40 mg Oral QPM   busPIRone  5 mg Oral BID   diltiazem  30 mg Oral Q6H   docusate sodium  100 mg Oral BID   feeding supplement  237 mL Oral BID BM   ferrous sulfate  325 mg Oral Q breakfast   fluticasone furoate-vilanterol  1 puff Inhalation Daily   furosemide  40 mg Intravenous BID   insulin aspart  0-15 Units Subcutaneous TID WC   insulin aspart  0-5 Units Subcutaneous QHS   methylPREDNISolone (SOLU-MEDROL) injection  60 mg Intravenous Q24H   midodrine  10 mg Oral TID WC   pantoprazole  40 mg Oral Daily   polyethylene glycol  17 g Oral Daily   sildenafil  40 mg Oral TID   sodium chloride flush  3 mL Intravenous Q12H   umeclidinium bromide  1 puff Inhalation Daily   Continuous Infusions:  heparin 1,450 Units/hr (12/05/20 1313)     LOS: 3 days     Cordelia Poche, MD [facility] 12/05/2020, 8:40 PM  If 7PM-7AM, please contact night-coverage [email or web address]

## 2020-12-05 NOTE — Progress Notes (Signed)
OT Cancellation Note  Patient Details Name: Gary [name] MRN: [id number] DOB: [birth year]   Cancelled Treatment:    Reason Eval/Treat Not Completed: Patient declined reporting fatigue from previous PT session. OT to check back as time allows.   Gloris Manchester OTR/L Supplemental OT, Department of rehab services [phone]  Zan Triska R H. 12/05/2020, 11:55 AM

## 2020-12-05 NOTE — Progress Notes (Signed)
Physical Therapy Treatment Patient Details Name: Gary [name] MRN: [id number] DOB: 04-17-1951 Today's Date: 12/05/2020    History of Present Illness 70 y.o. male presenting to ED 12/02/20 with SOB. Patient admitted with acute on chronic respiratory failure with hypoxia and acute on chronic R-sided heart failure. 7/19 thoracentesis. Previous admission 4/18-5/17 for acute respiratory failure with hypoxia associated with PAH with cor pulmonale. Then went to [facility] 5/17-5/25/22.  PMHx significant for DM, CHF, lung mass, HTN, CHF, and COPD on home O2.    PT Comments    Patient tolerating activity much better today. Still on 15L HFNC but with mild dyspnea and sats 94-97% with walking x 30 ft. HR with ?increase to 120-140 bpm (lots of artifact) but returned to sitting and quickly returned to good waveform with HR 107 bpm.     Follow Up Recommendations  Home health PT     Equipment Recommendations  None recommended by PT    Recommendations for Other Services       Precautions / Restrictions Precautions Precautions: Fall Precaution Comments: Monitor HR and SpO2    Mobility  Bed Mobility Overal bed mobility: Needs Assistance Bed Mobility: Supine to Sit     Supine to sit: Supervision     General bed mobility comments: HOB elevated ~20 degrees    Transfers Overall transfer level: Needs assistance Equipment used: Rolling walker (2 wheeled) Transfers: Sit to/from Stand Sit to Stand: Min assist         General transfer comment: Min assist for steadying as coming to stand  Ambulation/Gait Ambulation/Gait assistance: Min assist;+2 safety/equipment Gait Distance (Feet): 30 Feet Assistive device: Rolling walker (2 wheeled) Gait Pattern/deviations: Step-through pattern;Decreased stride length;Trunk flexed     General Gait Details: very mild dyspnea and ?HR elevating into 120-140s (lots of artifact and quickly returned to 107 with good waveform).   Stairs              Wheelchair Mobility    Modified Rankin (Stroke Patients Only)       Balance Overall balance assessment: Needs assistance Sitting-balance support: Feet supported;No upper extremity supported Sitting balance-Leahy Scale: Good     Standing balance support: Bilateral upper extremity supported;Single extremity supported Standing balance-Leahy Scale: Poor Standing balance comment: needs external support in standing.                            Cognition Arousal/Alertness: Awake/alert Behavior During Therapy: WFL for tasks assessed/performed Overall Cognitive Status: Within Functional Limits for tasks assessed                                        Exercises      General Comments General comments (skin integrity, edema, etc.): on 15L HFNC with sats 94-97% throughout session      Pertinent Vitals/Pain Pain Assessment: Faces Faces Pain Scale: Hurts even more Pain Location: neck Pain Descriptors / Indicators: Grimacing;Guarding Pain Intervention(s): Limited activity within patient's tolerance;Monitored during session;Repositioned    Home Living                      Prior Function            PT Goals (current goals can now be found in the care plan section) Acute Rehab PT Goals Patient Stated Goal: To return home. Time For Goal Achievement: 12/17/20 Potential to  Achieve Goals: Good Progress towards PT goals: Progressing toward goals    Frequency    Min 3X/week      PT Plan Current plan remains appropriate    Co-evaluation              AM-PAC PT "6 Clicks" Mobility   Outcome Measure  Help needed turning from your back to your side while in a flat bed without using bedrails?: A Little Help needed moving from lying on your back to sitting on the side of a flat bed without using bedrails?: A Little Help needed moving to and from a bed to a chair (including a wheelchair)?: A Little Help needed standing up from a  chair using your arms (e.g., wheelchair or bedside chair)?: A Little Help needed to walk in hospital room?: A Lot Help needed climbing 3-5 steps with a railing? : A Lot 6 Click Score: 16    End of Session Equipment Utilized During Treatment: Oxygen Activity Tolerance: Patient tolerated treatment well Patient left: in chair;with call bell/phone within reach;with chair alarm set Nurse Communication: Mobility status;Other (comment) (?can turn down O2 based on sats during session) PT Visit Diagnosis: Muscle weakness (generalized) (M62.81);Difficulty in walking, not elsewhere classified (R26.2)     Time: 1001-1018 PT Time Calculation (min) (ACUTE ONLY): 17 min  Charges:  $Gait Training: 8-22 mins                      Arby Barrette, PT Pager [phone]    Rexanne Mano 12/05/2020, 12:15 PM
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
## 2020-12-05 NOTE — Progress Notes (Signed)
ANTICOAGULATION CONSULT NOTE - Follow Up Consult  Pharmacy Consult for IV Heparin Indication: atrial fibrillation  Allergies  Allergen Reactions   Novocain [Procaine] Other (See Comments)    Unknown reaction as a young child    Patient Measurements: Height: 5' 7"  (170.2 cm) Weight: 79.7 kg (175 lb 11.3 oz) IBW/kg (Calculated) : 66.1 Heparin Dosing Weight: 79.7 kg  Vital Signs: Temp: 99.5 F (37.5 C) (07/20 1954) Temp Source: Oral (07/20 1954) BP: 96/53 (07/20 1954) Pulse Rate: 74 (07/20 1954)  Labs: Recent Labs    12/03/20 0451 12/03/20 0451 12/04/20 0206 12/05/20 0010 12/05/20 1003 12/05/20 2015  HGB 10.6*  --  9.6* 9.3*  --   --   HCT 35.5*  --  32.1* 31.6*  --   --   PLT 358  --  308 331  --   --   APTT  --    < > 76* 48* 46* 65*  HEPARINUNFRC  --   --  >1.10* 0.87* 0.81*  --   CREATININE 1.17  --  0.99 1.20  --   --    < > = values in this interval not displayed.    Estimated Creatinine Clearance: 56.3 mL/min (by C-G formula based on SCr of 1.2 mg/dL).   Assessment: 70 yr old man on apixaban PTA for afib (last dose 7/17 at 0700). Apixaban on hold with IV heparin bridge, pending possible procedures. Pt is S/P thoracentesis on 7/19, for which heparin was briefly held. Given recent apixaban exposure, will monitor anticoagulation using aPTT until aPTT and heparin levels correlate.  aPTT ~7 hrs after heparin infusion was increased to 1450 units/hr was 65 sec, which is just below the goal range for this pt. H/H 9.3/31.6, plt 331 (CBC stable). Per RN, no issues with IV or bleeding observed.  Goal of Therapy:  Heparin level 0.3-0.7 units/ml aPTT 66-102 secs Monitor platelets by anticoagulation protocol: Yes   Plan:  Increase heparin infusion to 1550 units/hr Check aPTT, heparin level in 7-8  hrs Monitor daily aPTT, heparin level, CBC Monitor for bleeding  Gillermina Hu, PharmD, BCPS, [facility] Clinical Pharmacist

## 2020-12-06 ENCOUNTER — Telehealth: Payer: Self-pay | Admitting: Critical Care Medicine

## 2020-12-06 DIAGNOSIS — I4819 Other persistent atrial fibrillation: Secondary | ICD-10-CM | POA: Diagnosis not present

## 2020-12-06 DIAGNOSIS — J9622 Acute and chronic respiratory failure with hypercapnia: Secondary | ICD-10-CM | POA: Diagnosis not present

## 2020-12-06 DIAGNOSIS — I509 Heart failure, unspecified: Secondary | ICD-10-CM | POA: Diagnosis not present

## 2020-12-06 DIAGNOSIS — I2729 Other secondary pulmonary hypertension: Secondary | ICD-10-CM

## 2020-12-06 DIAGNOSIS — I50813 Acute on chronic right heart failure: Secondary | ICD-10-CM

## 2020-12-06 DIAGNOSIS — G4733 Obstructive sleep apnea (adult) (pediatric): Secondary | ICD-10-CM | POA: Diagnosis not present

## 2020-12-06 DIAGNOSIS — J9621 Acute and chronic respiratory failure with hypoxia: Secondary | ICD-10-CM | POA: Diagnosis not present

## 2020-12-06 DIAGNOSIS — R590 Localized enlarged lymph nodes: Secondary | ICD-10-CM

## 2020-12-06 DIAGNOSIS — R918 Other nonspecific abnormal finding of lung field: Secondary | ICD-10-CM

## 2020-12-06 DIAGNOSIS — J810 Acute pulmonary edema: Secondary | ICD-10-CM

## 2020-12-06 LAB — BASIC METABOLIC PANEL
Anion gap: 9 (ref 5–15)
BUN: 31 mg/dL — ABNORMAL HIGH (ref 8–23)
CO2: 28 mmol/L (ref 22–32)
Calcium: 8.2 mg/dL — ABNORMAL LOW (ref 8.9–10.3)
Chloride: 94 mmol/L — ABNORMAL LOW (ref 98–111)
Creatinine, Ser: 1.09 mg/dL (ref 0.61–1.24)
GFR, Estimated: >60 mL/min (ref >60)
Glucose, Bld: 374 mg/dL — ABNORMAL HIGH (ref 70–99)
Potassium: 3.9 mmol/L (ref 3.5–5.1)
Sodium: 131 mmol/L — ABNORMAL LOW (ref 135–145)

## 2020-12-06 LAB — CBC
HCT: 32.2 % — ABNORMAL LOW (ref 39.0–52.0)
Hemoglobin: 9.5 g/dL — ABNORMAL LOW (ref 13.0–17.0)
MCH: 23 pg — ABNORMAL LOW (ref 26.0–34.0)
MCHC: 29.5 g/dL — ABNORMAL LOW (ref 30.0–36.0)
MCV: 78 fL — ABNORMAL LOW (ref 80.0–100.0)
Platelets: 292 10*3/uL (ref 150–400)
RBC: 4.13 MIL/uL — ABNORMAL LOW (ref 4.22–5.81)
RDW: 18.4 % — ABNORMAL HIGH (ref 11.5–15.5)
WBC: 8.2 10*3/uL (ref 4.0–10.5)
nRBC: 0 % (ref 0.0–0.2)

## 2020-12-06 LAB — GLUCOSE, CAPILLARY
Glucose-Capillary: 185 mg/dL — ABNORMAL HIGH (ref 70–99)
Glucose-Capillary: 339 mg/dL — ABNORMAL HIGH (ref 70–99)
Glucose-Capillary: 375 mg/dL — ABNORMAL HIGH (ref 70–99)
Glucose-Capillary: 303 mg/dL — ABNORMAL HIGH (ref 70–99)

## 2020-12-06 LAB — BRAIN NATRIURETIC PEPTIDE: B Natriuretic Peptide: 492.4 pg/mL — ABNORMAL HIGH (ref 0.0–100.0)

## 2020-12-06 LAB — APTT
aPTT: 65 seconds — ABNORMAL HIGH (ref 24–36)
aPTT: 66 seconds — ABNORMAL HIGH (ref 24–36)

## 2020-12-06 LAB — HEPARIN LEVEL (UNFRACTIONATED): Heparin Unfractionated: 0.85 IU/mL — ABNORMAL HIGH (ref 0.30–0.70)

## 2020-12-06 MED ORDER — MAGNESIUM SULFATE IN D5W 1-5 GM/100ML-% IV SOLN
1.0000 g | Freq: Once | INTRAVENOUS | Status: AC
  Administered 2020-12-06: 1 g via INTRAVENOUS
  Filled 2020-12-06: qty 100

## 2020-12-06 NOTE — Progress Notes (Signed)
PROGRESS NOTE    Gary [name]  HEN:[id number] DOB: [birth year] DOA: 12/02/2020 PCP: Jalene Mullet, PA-C   Brief Narrative: Gary [name] is a 70 y.o. male with history of diabetes, hypertension, hyperlipidemia, congestive heart failure, COPD on home oxygen and trilogy machine for chronic respiratory failure with hypoxia and hypercapnia.  Patient presented secondary to shortness of breath with evidence of acute on chronic respiratory failure.  Patient required high flow nasal cannula.  Hospitalization complicated by acute on chronic right heart failure, atrial fibrillation with RVR, worsening of underlying lung disease.  Patient is currently on diuresis and has received a right thoracentesis to help with a large right pleural effusion.  Patient also noted to have a concerning right middle lobe mass.  Attempting to wean patient's oxygen use.    Assessment & Plan:   Principal Problem:   Acute on chronic respiratory failure with hypoxia (HCC) Active Problems:   Hyperlipidemia due to type 2 diabetes mellitus (HCC)   Atrial fibrillation (HCC)   Acute on chronic right heart failure (HCC)   COPD with hypoxia (HCC)   Lung mass   Acute on chronic respiratory failure with hypoxia and hypercapnia COPD CT chest significant for right pleural effusion, right middle lobe mass and right airspace consolidation. Thoracentesis performed on 7/19 significant for likely exudative fluid; culture with no growth to date.  Patient reports feeling a little better since the thoracentesis and is improving with Lasix diuresis. Patient was empirically treated with cefepime IV which was discontinued after negative procalcitonin. I was able to wean him down to 6 lpm at bedside -Wean oxygen to home 4-5 Lpm -Pulmonology recommendations: Continue bronchodilators -Continue Incruse Ellipta, Breo Ellipta  Right lung mass Associated mediastinal adenopathy.  Cytology is pending from pleural effusion.  Unable to  biopsy secondary to severe hypoxia. Cytology without evidence of malignant cells -Pulmonology recommendations: considering outpatient biopsy  Atrial fibrillation with RVR Patient is on Eliquis, amiodarone and Cardizem as an outpatient. Started on Cardizem IV and now transitioned to PO. Eliquis on hold secondary to procedures and patient started on heparin drip. Cardiology consulted for management -Cardiology recommendations: resume Eliquis per PCCM  Acute on chronic right-sided heart failure Patient is on Demadex as an outpatient. Currently on Lasix -Continue Lasix -Cardiology recommendations: Transition to torsemide in AM  Pulmonary artery hypertension OSA/OHS -Continue Revatio -Continue trilogy nightly  Hypotension Chronic. Patient is on midodrine as an outpatient. Complicated by above problems. -Continue midodrine  Hyperlipidemia -Continue Lipitor  Microcytic anemia Patient is on iron supplementation as an outpatient. Stable.  Hypokalemia Hypomagnesemia Replete as needed.  Diabetes mellitus, type 2 Hemoglobin A1C of 5.2%. Patient is on Jardiance, metformin as an outpatient. Started on SSI inpatient. -Continue SSI  DVT prophylaxis: Heparin IV Code Status:   Code Status: Full Code Family Communication: None at bedside Disposition Plan: Discharge home (HHPT) likely in 24 hours pending PCCM recommendations. Appears he will be ready from a cardiology standpoint.   Consultants:  Pulmonology Cardiology  Procedures:    Antimicrobials: Cefepime IV   Subjective: Feels much better today. No dyspnea. Worked with therapy today and yesterday.  Objective: Vitals:   12/06/20 0318 12/06/20 0354 12/06/20 0358 12/06/20 0733  BP:  106/65  100/64  Pulse:  82  90  Resp: (P) 19 20  (!) 22  Temp:  99 F (37.2 C)    TempSrc:  Axillary    SpO2:  100%  99%  Weight:   81.2 kg   Height:  Intake/Output Summary (Last 24 hours) at 12/06/2020 1116 Last data filed at  12/06/2020 0610 Gross per 24 hour  Intake 790.01 ml  Output 1600 ml  Net -809.99 ml    Filed Weights   12/02/20 1522 12/02/20 1906 12/06/20 0358  Weight: 81.3 kg 79.7 kg 81.2 kg    Examination:  General exam: Appears calm and comfortable Respiratory system: Rales. Respiratory effort normal. Cardiovascular system: S1 & S2 heard, RRR. No murmurs, rubs, gallops or clicks. Gastrointestinal system: Abdomen is nondistended, soft and nontender. Easily reducible ventral hernia. Normal bowel sounds heard. Central nervous system: Alert and oriented. No focal neurological deficits. Musculoskeletal:  No calf tenderness Skin: No cyanosis. No rashes Psychiatry: Judgement and insight appear normal. Mood & affect appropriate.     Data Reviewed: I have personally reviewed following labs and imaging studies  CBC Lab Results  Component Value Date   WBC 8.2 12/06/2020   RBC 4.13 (L) 12/06/2020   HGB 9.5 (L) 12/06/2020   HCT 32.2 (L) 12/06/2020   MCV 78.0 (L) 12/06/2020   MCH 23.0 (L) 12/06/2020   PLT 292 12/06/2020   MCHC 29.5 (L) 12/06/2020   RDW 18.4 (H) 12/06/2020   LYMPHSABS 0.9 12/04/2020   MONOABS 1.0 12/04/2020   EOSABS 0.1 12/04/2020   BASOSABS 0.0 35/36/1443     Last metabolic panel Lab Results  Component Value Date   NA 135 12/05/2020   K 3.7 12/05/2020   CL 97 (L) 12/05/2020   CO2 28 12/05/2020   BUN 18 12/05/2020   CREATININE 1.20 12/05/2020   GLUCOSE 141 (H) 12/05/2020   GFRNONAA >60 12/05/2020   GFRAA 80 (L) 05/14/2013   CALCIUM 8.0 (L) 12/05/2020   PROT 7.4 12/02/2020   ALBUMIN 3.5 12/02/2020   BILITOT 1.1 12/02/2020   ALKPHOS 66 12/02/2020   AST 34 12/02/2020   ALT 19 12/02/2020   ANIONGAP 10 12/05/2020    CBG (last 3)  Recent Labs    12/05/20 1920 12/05/20 2323 12/06/20 0757  GLUCAP 154* 174* 185*      GFR: Estimated Creatinine Clearance: 56.7 mL/min (by C-G formula based on SCr of 1.2 mg/dL).  Coagulation Profile: No results for input(s):  INR, PROTIME in the last 168 hours.  Recent Results (from the past 240 hour(s))  Resp Panel by RT-PCR (Flu A&B, Covid) Nasopharyngeal Swab     Status: None   Collection Time: 12/02/20 10:18 AM   Specimen: Nasopharyngeal Swab; Nasopharyngeal(NP) swabs in vial transport medium  Result Value Ref Range Status   SARS Coronavirus 2 by RT PCR NEGATIVE NEGATIVE Final    Comment: (NOTE) SARS-CoV-2 target nucleic acids are NOT DETECTED.  The SARS-CoV-2 RNA is generally detectable in upper respiratory specimens during the acute phase of infection. The lowest concentration of SARS-CoV-2 viral copies this assay can detect is 138 copies/mL. A negative result does not preclude SARS-Cov-2 infection and should not be used as the sole basis for treatment or other patient management decisions. A negative result may occur with  improper specimen collection/handling, submission of specimen other than nasopharyngeal swab, presence of viral mutation(s) within the areas targeted by this assay, and inadequate number of viral copies(<138 copies/mL). A negative result must be combined with clinical observations, patient history, and epidemiological information. The expected result is Negative.  Fact Sheet for Patients:  EntrepreneurPulse.com.au  Fact Sheet for Healthcare Providers:  IncredibleEmployment.be  This test is no t yet approved or cleared by the [place] FDA and  has been authorized for detection  and/or diagnosis of SARS-CoV-2 by FDA under an Emergency Use Authorization (EUA). This EUA will remain  in effect (meaning this test can be used) for the duration of the COVID-19 declaration under Section 564(b)(1) of the Act, 21 U.S.C.section 360bbb-3(b)(1), unless the authorization is terminated  or revoked sooner.       Influenza A by PCR NEGATIVE NEGATIVE Final   Influenza B by PCR NEGATIVE NEGATIVE Final    Comment: (NOTE) The Xpert Xpress  SARS-CoV-2/FLU/RSV plus assay is intended as an aid in the diagnosis of influenza from Nasopharyngeal swab specimens and should not be used as a sole basis for treatment. Nasal washings and aspirates are unacceptable for Xpert Xpress SARS-CoV-2/FLU/RSV testing.  Fact Sheet for Patients: EntrepreneurPulse.com.au  Fact Sheet for Healthcare Providers: IncredibleEmployment.be  This test is not yet approved or cleared by the [place] FDA and has been authorized for detection and/or diagnosis of SARS-CoV-2 by FDA under an Emergency Use Authorization (EUA). This EUA will remain in effect (meaning this test can be used) for the duration of the COVID-19 declaration under Section 564(b)(1) of the Act, 21 U.S.C. section 360bbb-3(b)(1), unless the authorization is terminated or revoked.  Performed at [facility], [place] [address], [place], [place] [address]   Body fluid culture w Gram Stain     Status: None (Preliminary result)   Collection Time: 12/04/20  2:58 PM   Specimen: Body Fluid  Result Value Ref Range Status   Specimen Description FLUID RIGHT PLEURAL  Final   Special Requests NONE  Final   Gram Stain   Final    FEW WBC PRESENT, PREDOMINANTLY MONONUCLEAR NO ORGANISMS SEEN    Culture   Final    NO GROWTH 2 DAYS Performed at [facility], [address], [place], [place] [address]    Report Status PENDING  Incomplete         Radiology Studies: CT CHEST WO CONTRAST  Result Date: 12/04/2020 CLINICAL DATA:  Dyspnea, hypoxia, pulmonary nodule EXAM: CT CHEST WITHOUT CONTRAST TECHNIQUE: Multidetector CT imaging of the chest was performed following the standard protocol without IV contrast. COMPARISON:  12/02/2020, 09/20/2020 FINDINGS: Cardiovascular: Extensive multi-vessel coronary artery calcification. Mild global cardiomegaly. No pericardial effusion. Central pulmonary arteries are enlarged in keeping with changes of pulmonary  arterial hypertension. The thoracic aorta is of normal caliber. Moderate atherosclerotic calcification noted throughout the thoracic aorta. Mediastinum/Nodes: Visualized thyroid is unremarkable. There is extensive mediastinal adenopathy which appears grossly stable since prior examination right hilar adenopathy is also suspected though is difficult to clearly delineate in absence of contrast administration. Precarinal lymph node measures 1.9 x 2.8 cm in greatest dimension, axial # 61. The esophagus is unremarkable. Lungs/Pleura: Interval right thoracentesis. Small pleural fluid persists. Improved aeration of the a right middle and lower lobes with extensive multifocal consolidation persisting in these regions, possibly reflective of superimposed atypical infectious process. There is, additionally, progressive ground-glass infiltrate within the left upper lobe and patchy infiltrate within the right upper lobe. As noted previously, there is engorgement of the central pulmonary vasculature as well as ground-glass pulmonary infiltrate within the lung bases bilaterally as noted interlobular septal thickening suggesting at least some degree of superimposed pulmonary edema, slightly improved since prior examination. No pneumothorax. Central airways are widely patent. Known spiculated mass within the right middle lobe is obscured by multifocal consolidation within this region. Upper Abdomen: No acute abnormality. Musculoskeletal: No acute bone abnormality. Osseous structures are age-appropriate. No lytic or blastic bone lesion. IMPRESSION:  Interval right thoracentesis. Small residual pleural fluid. Improved aeration of the right lung base. Findings in keeping with moderate pulmonary edema, improved since prior examination. Superimposed progressive multifocal consolidation within the right lung base and airspace infiltrate within the upper lung zones bilaterally suggestive of a superimposed atypical infectious or  inflammatory process. No central obstructing lesion. Stable mediastinal adenopathy. Extensive multi-vessel coronary artery calcification. Morphologic changes in keeping with pulmonary arterial hypertension. Stable cardiomegaly. Aortic Atherosclerosis (ICD10-I70.0). Electronically Signed   By: Fidela Salisbury MD   On: 12/04/2020 20:43   DG CHEST PORT 1 VIEW  Result Date: 12/04/2020 CLINICAL DATA:  Right thoracentesis, cough, short of breath EXAM: PORTABLE CHEST 1 VIEW COMPARISON:  12/02/2020 FINDINGS: Single frontal view of the chest demonstrates an enlarged cardiac silhouette. Persistent central vascular congestion and interstitial prominence. Decreased right pleural effusion after thoracentesis. Persistent consolidation at the right lung base consistent with atelectasis. No pneumothorax. No acute bony abnormalities. IMPRESSION: 1. No complication after right thoracentesis, with small residual right pleural effusion identified. 2. Stable right basilar atelectasis. 3. Continued central vascular congestion and interstitial prominence compatible with volume overload. Electronically Signed   By: Randa Ngo M.D.   On: 12/04/2020 18:10        Scheduled Meds:  amiodarone  200 mg Oral Daily   aspirin EC  81 mg Oral q morning   atorvastatin  40 mg Oral QPM   busPIRone  5 mg Oral BID   diltiazem  30 mg Oral Q6H   docusate sodium  100 mg Oral BID   feeding supplement  237 mL Oral BID BM   ferrous sulfate  325 mg Oral Q breakfast   fluticasone furoate-vilanterol  1 puff Inhalation Daily   furosemide  40 mg Intravenous BID   insulin aspart  0-15 Units Subcutaneous TID WC   insulin aspart  0-5 Units Subcutaneous QHS   methylPREDNISolone (SOLU-MEDROL) injection  60 mg Intravenous Q24H   midodrine  10 mg Oral TID WC   pantoprazole  40 mg Oral Daily   polyethylene glycol  17 g Oral Daily   sildenafil  40 mg Oral TID   sodium chloride flush  3 mL Intravenous Q12H   umeclidinium bromide  1 puff  Inhalation Daily   Continuous Infusions:  heparin 1,700 Units/hr (12/06/20 0550)     LOS: 4 days     Cordelia Poche, MD [facility] 12/06/2020, 11:16 AM  If 7PM-7AM, please contact night-coverage [email or web address]

## 2020-12-06 NOTE — Progress Notes (Signed)
Physical Therapy Treatment Patient Details Name: Gary [name] MRN: [id number] DOB: 09-28-1950 Today's Date: 12/06/2020    History of Present Illness 70 y.o. male presenting to ED 12/02/20 with SOB. Patient admitted with acute on chronic respiratory failure with hypoxia and acute on chronic R-sided heart failure. 7/19 thoracentesis. Previous admission 4/18-5/17 for acute respiratory failure with hypoxia associated with PAH with cor pulmonale. Then went to [facility] 5/17-5/25/22.  PMHx significant for DM, CHF, lung mass, HTN, CHF, and COPD on home O2.    PT Comments    Patient on 8L at rest at beginning of session. As began ambulating, immediately dropped to 86% and O2 increased to 10L. Patient maintained sats 89-90% throughout ambulation, however dropped into 70s once seated after walk. Pt able to use pursed lip breathing to slow breathing, but required briefly to incr O2 to 15L for recovery of sats to 88%. By end of session pt back down to 8L with sats 88%.     Follow Up Recommendations  Home health PT     Equipment Recommendations  None recommended by PT    Recommendations for Other Services       Precautions / Restrictions Precautions Precautions: Fall Precaution Comments: Monitor HR and SpO2    Mobility  Bed Mobility                    Transfers Overall transfer level: Needs assistance Equipment used: Rolling walker (2 wheeled) Transfers: Sit to/from Stand Sit to Stand: Min guard         General transfer comment: no steadying assist needed  Ambulation/Gait Ambulation/Gait assistance: Min guard Gait Distance (Feet): 100 Feet Assistive device: Rolling walker (2 wheeled) Gait Pattern/deviations: Step-through pattern;Decreased stride length;Trunk flexed     General Gait Details: tolerated increased distance, however on return to seated rest sats continued to drop (on 10L) to 78% with very slow recovery (81% after 3 minutes; incr to 15L and sats gradually  recovered to 95%; turned down to 10 L and at end of session decr to 8L (as he was at beginning of session)   Stairs             Wheelchair Mobility    Modified Rankin (Stroke Patients Only)       Balance Overall balance assessment: Needs assistance Sitting-balance support: Feet supported;No upper extremity supported Sitting balance-Leahy Scale: Good     Standing balance support: Bilateral upper extremity supported;Single extremity supported Standing balance-Leahy Scale: Poor Standing balance comment: needs external support in standing.                            Cognition Arousal/Alertness: Awake/alert Behavior During Therapy: WFL for tasks assessed/performed Overall Cognitive Status: Within Functional Limits for tasks assessed                                        Exercises      General Comments        Pertinent Vitals/Pain Pain Assessment: No/denies pain    Home Living                      Prior Function            PT Goals (current goals can now be found in the care plan section) Acute Rehab PT Goals Patient Stated Goal: To return  home. Time For Goal Achievement: 12/17/20 Potential to Achieve Goals: Good Progress towards PT goals: Progressing toward goals    Frequency    Min 3X/week      PT Plan Current plan remains appropriate    Co-evaluation              AM-PAC PT "6 Clicks" Mobility   Outcome Measure  Help needed turning from your back to your side while in a flat bed without using bedrails?: A Little Help needed moving from lying on your back to sitting on the side of a flat bed without using bedrails?: A Little Help needed moving to and from a bed to a chair (including a wheelchair)?: A Little Help needed standing up from a chair using your arms (e.g., wheelchair or bedside chair)?: A Little Help needed to walk in hospital room?: A Lot Help needed climbing 3-5 steps with a railing? : A  Lot 6 Click Score: 16    End of Session Equipment Utilized During Treatment: Oxygen Activity Tolerance: Treatment limited secondary to medical complications (Comment) (decr sats) Patient left: in chair;with call bell/phone within reach;with chair alarm set Nurse Communication: Mobility status;Other (comment) (sat response during exercise) PT Visit Diagnosis: Muscle weakness (generalized) (M62.81);Difficulty in walking, not elsewhere classified (R26.2)     Time: 4536-4680 PT Time Calculation (min) (ACUTE ONLY): 22 min  Charges:  $Gait Training: 8-22 mins                      Arby Barrette, PT Pager [phone]    Rexanne Mano 12/06/2020, 4:17 PM

## 2020-12-06 NOTE — Progress Notes (Signed)
Progress Note  Patient Name: Gary [name] Date of Encounter: 12/06/2020  [facility] Cardiologist: Elouise Munroe, MD   Subjective   Feels much better. "I am ready to go home".  I spoke to the patient's wife by phone as well.  Inpatient Medications    Scheduled Meds:  amiodarone  200 mg Oral Daily   aspirin EC  81 mg Oral q morning   atorvastatin  40 mg Oral QPM   busPIRone  5 mg Oral BID   diltiazem  30 mg Oral Q6H   docusate sodium  100 mg Oral BID   feeding supplement  237 mL Oral BID BM   ferrous sulfate  325 mg Oral Q breakfast   fluticasone furoate-vilanterol  1 puff Inhalation Daily   furosemide  40 mg Intravenous BID   insulin aspart  0-15 Units Subcutaneous TID WC   insulin aspart  0-5 Units Subcutaneous QHS   methylPREDNISolone (SOLU-MEDROL) injection  60 mg Intravenous Q24H   midodrine  10 mg Oral TID WC   pantoprazole  40 mg Oral Daily   polyethylene glycol  17 g Oral Daily   sildenafil  40 mg Oral TID   sodium chloride flush  3 mL Intravenous Q12H   umeclidinium bromide  1 puff Inhalation Daily   Continuous Infusions:  heparin 1,700 Units/hr (12/06/20 0550)   PRN Meds: acetaminophen **OR** acetaminophen, ALPRAZolam, bisacodyl, morphine injection, ondansetron **OR** ondansetron (ZOFRAN) IV, oxyCODONE, polyethylene glycol, sodium chloride   Vital Signs    Vitals:   12/06/20 0318 12/06/20 0354 12/06/20 0358 12/06/20 0733  BP:  106/65  100/64  Pulse:  82  90  Resp: (P) 19 20  (!) 22  Temp:  99 F (37.2 C)    TempSrc:  Axillary    SpO2:  100%  99%  Weight:   81.2 kg   Height:        Intake/Output Summary (Last 24 hours) at 12/06/2020 1037 Last data filed at 12/06/2020 0610 Gross per 24 hour  Intake 790.01 ml  Output 1600 ml  Net -809.99 ml   Last 3 Weights 12/06/2020 12/02/2020 12/02/2020  Weight (lbs) 179 lb 0.2 oz 175 lb 11.3 oz 179 lb 3.2 oz  Weight (kg) 81.2 kg 79.7 kg 81.285 kg      Telemetry    Regular rhythm heart rate 80,  unclear if flutter wave versus P wave, will get ECG to confirm- Personally Reviewed  ECG    No new - Personally Reviewed  Physical Exam   GEN: No acute distress.   Neck: No JVD Cardiac: RRR, no murmurs, rubs, or gallops.  Respiratory: Clear to auscultation bilaterally. GI: Soft, nontender, non-distended  MS: No edema; No deformity. Neuro:  Nonfocal  Psych: Normal affect     Labs    High Sensitivity Troponin:   Recent Labs  Lab 12/02/20 1018 12/02/20 1232  TROPONINIHS 27* 27*      Chemistry Recent Labs  Lab 12/02/20 1018 12/03/20 0451 12/04/20 0206 12/05/20 0010 12/05/20 2241  NA 137 141 136 135  --   K 4.1 3.7 3.3* 3.5 3.7  CL 98 101 99 97*  --   CO2 22 28 30 28   --   GLUCOSE 240* 119* 114* 141*  --   BUN 25* 22 20 18   --   CREATININE 1.25* 1.17 0.99 1.20  --   CALCIUM 8.8* 8.5* 8.1* 8.0*  --   PROT 7.4  --   --   --   --  ALBUMIN 3.5  --   --   --   --   AST 34  --   --   --   --   ALT 19  --   --   --   --   ALKPHOS 66  --   --   --   --   BILITOT 1.1  --   --   --   --   GFRNONAA >60 >60 >60 >60  --   ANIONGAP 17* 12 7 10   --      Hematology Recent Labs  Lab 12/04/20 0206 12/05/20 0010 12/06/20 0411  WBC 10.5 10.0 8.2  RBC 4.15* 4.06* 4.13*  HGB 9.6* 9.3* 9.5*  HCT 32.1* 31.6* 32.2*  MCV 77.3* 77.8* 78.0*  MCH 23.1* 22.9* 23.0*  MCHC 29.9* 29.4* 29.5*  RDW 18.4* 18.6* 18.4*  PLT 308 331 292    BNP Recent Labs  Lab 12/02/20 1018 12/06/20 0411  BNP 574.4* 492.4*     DDimer No results for input(s): DDIMER in the last 168 hours.   Radiology    CT CHEST WO CONTRAST  Result Date: 12/04/2020 CLINICAL DATA:  Dyspnea, hypoxia, pulmonary nodule EXAM: CT CHEST WITHOUT CONTRAST TECHNIQUE: Multidetector CT imaging of the chest was performed following the standard protocol without IV contrast. COMPARISON:  12/02/2020, 09/20/2020 FINDINGS: Cardiovascular: Extensive multi-vessel coronary artery calcification. Mild global cardiomegaly. No  pericardial effusion. Central pulmonary arteries are enlarged in keeping with changes of pulmonary arterial hypertension. The thoracic aorta is of normal caliber. Moderate atherosclerotic calcification noted throughout the thoracic aorta. Mediastinum/Nodes: Visualized thyroid is unremarkable. There is extensive mediastinal adenopathy which appears grossly stable since prior examination right hilar adenopathy is also suspected though is difficult to clearly delineate in absence of contrast administration. Precarinal lymph node measures 1.9 x 2.8 cm in greatest dimension, axial # 61. The esophagus is unremarkable. Lungs/Pleura: Interval right thoracentesis. Small pleural fluid persists. Improved aeration of the a right middle and lower lobes with extensive multifocal consolidation persisting in these regions, possibly reflective of superimposed atypical infectious process. There is, additionally, progressive ground-glass infiltrate within the left upper lobe and patchy infiltrate within the right upper lobe. As noted previously, there is engorgement of the central pulmonary vasculature as well as ground-glass pulmonary infiltrate within the lung bases bilaterally as noted interlobular septal thickening suggesting at least some degree of superimposed pulmonary edema, slightly improved since prior examination. No pneumothorax. Central airways are widely patent. Known spiculated mass within the right middle lobe is obscured by multifocal consolidation within this region. Upper Abdomen: No acute abnormality. Musculoskeletal: No acute bone abnormality. Osseous structures are age-appropriate. No lytic or blastic bone lesion. IMPRESSION: Interval right thoracentesis. Small residual pleural fluid. Improved aeration of the right lung base. Findings in keeping with moderate pulmonary edema, improved since prior examination. Superimposed progressive multifocal consolidation within the right lung base and airspace infiltrate  within the upper lung zones bilaterally suggestive of a superimposed atypical infectious or inflammatory process. No central obstructing lesion. Stable mediastinal adenopathy. Extensive multi-vessel coronary artery calcification. Morphologic changes in keeping with pulmonary arterial hypertension. Stable cardiomegaly. Aortic Atherosclerosis (ICD10-I70.0). Electronically Signed   By: Fidela Salisbury MD   On: 12/04/2020 20:43   DG CHEST PORT 1 VIEW  Result Date: 12/04/2020 CLINICAL DATA:  Right thoracentesis, cough, short of breath EXAM: PORTABLE CHEST 1 VIEW COMPARISON:  12/02/2020 FINDINGS: Single frontal view of the chest demonstrates an enlarged cardiac silhouette. Persistent central vascular congestion and interstitial prominence. Decreased  right pleural effusion after thoracentesis. Persistent consolidation at the right lung base consistent with atelectasis. No pneumothorax. No acute bony abnormalities. IMPRESSION: 1. No complication after right thoracentesis, with small residual right pleural effusion identified. 2. Stable right basilar atelectasis. 3. Continued central vascular congestion and interstitial prominence compatible with volume overload. Electronically Signed   By: Randa Ngo M.D.   On: 12/04/2020 18:10    Cardiac Studies   Echo 12/04/20 IMPRESSIONS     1. Left ventricular ejection fraction, by estimation, is 55 to 60%. The  left ventricle has normal function. The left ventricle has no regional  wall motion abnormalities. There is moderate concentric left ventricular  hypertrophy. Left ventricular  diastolic function could not be evaluated. There is the interventricular  septum is flattened in systole and diastole, consistent with right  ventricular pressure and volume overload.   2. Right ventricular systolic function is severely reduced. The right  ventricular size is moderately enlarged. There is moderately elevated  pulmonary artery systolic pressure. The estimated right  ventricular  systolic pressure is 38.4 mmHg.   3. Left atrial size was moderately dilated.   4. Right atrial size was moderately dilated.   5. The mitral valve is normal in structure. Trivial mitral valve  regurgitation.   6. The aortic valve is tricuspid. There is mild calcification of the  aortic valve. Aortic valve regurgitation is not visualized. Mild aortic  valve sclerosis is present, with no evidence of aortic valve stenosis.   7. The inferior vena cava is dilated in size with <50% respiratory  variability, suggesting right atrial pressure of 15 mmHg.   Comparison(s): No significant change from prior study. Prior images  reviewed side by side.   FINDINGS   Left Ventricle: Left ventricular ejection fraction, by estimation, is 55  to 60%. The left ventricle has normal function. The left ventricle has no  regional wall motion abnormalities. The left ventricular internal cavity  size was normal in size. There is   moderate concentric left ventricular hypertrophy. The interventricular  septum is flattened in systole and diastole, consistent with right  ventricular pressure and volume overload. Left ventricular diastolic  function could not be evaluated. Left  ventricular diastolic function could not be evaluated due to atrial  fibrillation.   Right Ventricle: The right ventricular size is moderately enlarged. Right  ventricular systolic function is severely reduced. There is moderately  elevated pulmonary artery systolic pressure. The tricuspid regurgitant  velocity is 3.33 m/s, and with an  assumed right atrial pressure of 15 mmHg, the estimated right ventricular  systolic pressure is 66.5 mmHg.   Left Atrium: Left atrial size was moderately dilated.   Right Atrium: Right atrial size was moderately dilated.   Pericardium: There is no evidence of pericardial effusion.   Mitral Valve: The mitral valve is normal in structure. There is mild  thickening of the mitral valve  leaflet(s). Mild mitral annular  calcification. Trivial mitral valve regurgitation.   Tricuspid Valve: The tricuspid valve is normal in structure. Tricuspid  valve regurgitation is mild.   Aortic Valve: The aortic valve is tricuspid. There is mild calcification  of the aortic valve. Aortic valve regurgitation is not visualized. Mild  aortic valve sclerosis is present, with no evidence of aortic valve  stenosis.   Pulmonic Valve: The pulmonic valve was normal in structure. Pulmonic valve  regurgitation is trivial.   Aorta: The aortic root and ascending aorta are structurally normal, with  no evidence of dilitation.  Venous: The inferior vena cava is dilated in size with less than 50%  respiratory variability, suggesting right atrial pressure of 15 mmHg.   IAS/Shunts: No atrial level shunt detected by color flow Doppler.   R/LHC 08/2020: Assessment: 1. 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 There is a large RV marginal branch that is widely patent 2. EF 60-65% 3. Moderate PAH with elevated PVR and moderately reduced cardiac output   Plan/Discussion:   Will manage CAD medically. Will need w/u for PAH and cor pulmonale .   Diagnostic Dominance: Right      RHC 09/2020: Assessment: 1. Mild pulmonary HTN 2. Normal left-sided filling pressures 3. High cardiac output by Fick (suspect to hyperoxygenation with Bipap). Output normal by thermo 4. No evidence of intracardiac shunting.   Plan/Discussion:   Very mild PAH. Normal to high cardiac output. No evidence of intracardiac shunting. Cannot exclude peripheral shunting.   Echocardiogram limited bubble study 09/2020: There is no evidence of right to left shunting at rest or with the  Valsalva maneuver.  The right heart chambers are moderately-to-severely dilated and right  ventricular systolic function is severely decreased.      Patient Profile     70 y.o. male  PMH of  CAD, paroxysmal atrial fibrillation, right heart failure, PAH with cor pulmonale, COPD, HTN, HLD, DM type 2, and GI bleed  in May transfused 3 units following for atrial fib and RHF.   Assessment & Plan     Acute on chronic hypoxemic and hypercapnic resp failure  with pl effusion and RH failure.  - neg 6.5 L since admit, weights are inaccurate were not obtained -He appears much better today and closer to baseline.  We will transition back to torsemide 20 mg daily starting tomorrow. -echo similar to one done 09/2020 -pl effuison with thoracentesis removed about 2.5L at bedside, feels this was the most helpful.  Cytology with no malignant cells.  Persistent atrial fib has been in a fib since May when discharged in a fib -on amio 200 mg  daily -would avoid long term amio use with underlying lung disease if at all possible.  - on heparin, resume eliquis when stable from PCCM.  Per patient they are considering repeat imaging. -Rhythm is regular today, will get ECG to confirm rhythm given difficult to interpret telemetry due to baseline artifact.   PAH 2/2 mod by cath underlying OSA/OHS/hypoxic lung disease  was started on CPAP/Trilogy outpatient and has been tolerating well. -continue sildenafil and cpap  Low BP on midodrine 10 mg TID  CAD  -mostly nonobstructive -no ASA with AC  Anemia per IM Hgb 9.5 down from 11.4  - we may want to consider [facility] referral as outpatient.  Rt middle lobe spiculated nodule per PCCM.  Patient is improving and may be nearing appropriateness for hospital discharge from a cardiovascular standpoint.      For questions or updates, please contact [facility] Please consult [email or web address] for contact info under        Signed, Elouise Munroe, MD  12/05/2020, 9:00 PM

## 2020-12-06 NOTE — Progress Notes (Signed)
Interval CV note:  ECG obtained, appears to be in sinus rhythm with RBBB. On anticoagulation with IV heparin while eliquis is on hold for any pulmonary procedures.  He is symptomatically much better today, suspect afib contributes significantly to his SOB.

## 2020-12-06 NOTE — Progress Notes (Signed)
ANTICOAGULATION CONSULT NOTE - Follow Up Consult  Pharmacy Consult for heparin Indication: atrial fibrillation  Labs: Recent Labs    12/04/20 0206 12/05/20 0010 12/05/20 1003 12/05/20 2015 12/06/20 0411  HGB 9.6* 9.3*  --   --  9.5*  HCT 32.1* 31.6*  --   --  32.2*  PLT 308 331  --   --  292  APTT 76* 48* 46* 65* 65*  HEPARINUNFRC >1.10* 0.87* 0.81*  --  0.85*  CREATININE 0.99 1.20  --   --   --     Assessment: 70yo male remains subtherapeutic on heparin with no change in PTT despite rate increase; no gtt issues or signs of bleeding per RN.  Goal of Therapy:  aPTT 66-102 seconds   Plan:  Will increase heparin gtt by 10% to 1700 units/hr and check PTT in 6-8 hours.    Wynona Neat, PharmD, BCPS  12/06/2020,5:40 AM

## 2020-12-06 NOTE — Telephone Encounter (Signed)
I have sent In order for CT chest at [facility] to be done before appt with Dr. Valeta Harms on 12/12/20. Nothing further needed.

## 2020-12-06 NOTE — Progress Notes (Signed)
ANTICOAGULATION CONSULT NOTE - Follow Up Consult  Pharmacy Consult for Heparin Indication: atrial fibrillation  Allergies  Allergen Reactions   Novocain [Procaine] Other (See Comments)    Unknown reaction as a young child    Patient Measurements: Height: 5' 7"  (170.2 cm) Weight: 81.2 kg (179 lb 0.2 oz) IBW/kg (Calculated) : 66.1 Heparin Dosing Weight:  81.2 kg  Vital Signs: Temp: 98 F (36.7 C) (07/21 1152) Temp Source: Oral (07/21 1152) BP: 99/56 (07/21 1152) Pulse Rate: 90 (07/21 0733)  Labs: Recent Labs    12/04/20 0206 12/05/20 0010 12/05/20 1003 12/05/20 2015 12/06/20 0411 12/06/20 1303  HGB 9.6* 9.3*  --   --  9.5*  --   HCT 32.1* 31.6*  --   --  32.2*  --   PLT 308 331  --   --  292  --   APTT 76* 48* 46* 65* 65* 66*  HEPARINUNFRC >1.10* 0.87* 0.81*  --  0.85*  --   CREATININE 0.99 1.20  --   --   --  1.09    Estimated Creatinine Clearance: 62.5 mL/min (by C-G formula based on SCr of 1.09 mg/dL).   Assessment: Anticoag: IV heparin while holding PTA Eliquis for afib. LD Eliquis 7/17 at 0700. S/p thoracentesis on 7/19 - IV heparin 0.85 elevated this AM from DOAC interaction , Hgb 9.5 stable. Plts 292. aPTT 66 low end of goal range.  Goal of Therapy:  Heparin level 0.3-0.7 units/ml Monitor platelets by anticoagulation protocol: Yes   Plan:  Increase IV heparin to 1800 units/hr so doesn't drop below goal. Monitor daily HL and aPTT  F/u resuming home Eliquis when ok with CCM. Needs f/u CT     Nataliyah Packham S. Alford Highland, PharmD, BCPS Clinical Staff Pharmacist [email or web address]  Alford Highland, Raiden Haydu Stillinger 12/06/2020,1:56 PM

## 2020-12-06 NOTE — Plan of Care (Signed)
  Problem: Activity: Goal: Risk for activity intolerance will decrease Outcome: Progressing   Problem: Nutrition: Goal: Adequate nutrition will be maintained Outcome: Progressing   Problem: Coping: Goal: Level of anxiety will decrease Outcome: Progressing   Problem: Elimination: Goal: Will not experience complications related to bowel motility Outcome: Progressing   

## 2020-12-06 NOTE — Care Management Important Message (Signed)
Important Message  Patient Details  Name: Gary [name] MRN: [id number] Date of Birth: [birth year]   Medicare Important Message Given:  Yes     Orbie Pyo 12/06/2020, 3:38 PM

## 2020-12-06 NOTE — Progress Notes (Signed)
Progress Note  Patient Name: Gary [name] Date of Encounter: 12/06/2020  [facility] Cardiologist: Elouise Munroe, MD   Subjective   Feels well, no complaints  Inpatient Medications    Scheduled Meds:  amiodarone  200 mg Oral Daily   aspirin EC  81 mg Oral q morning   atorvastatin  40 mg Oral QPM   busPIRone  5 mg Oral BID   diltiazem  30 mg Oral Q6H   docusate sodium  100 mg Oral BID   feeding supplement  237 mL Oral BID BM   ferrous sulfate  325 mg Oral Q breakfast   fluticasone furoate-vilanterol  1 puff Inhalation Daily   furosemide  40 mg Intravenous BID   insulin aspart  0-15 Units Subcutaneous TID WC   insulin aspart  0-5 Units Subcutaneous QHS   methylPREDNISolone (SOLU-MEDROL) injection  60 mg Intravenous Q24H   midodrine  10 mg Oral TID WC   pantoprazole  40 mg Oral Daily   polyethylene glycol  17 g Oral Daily   sildenafil  40 mg Oral TID   sodium chloride flush  3 mL Intravenous Q12H   umeclidinium bromide  1 puff Inhalation Daily   Continuous Infusions:  heparin 1,700 Units/hr (12/06/20 0550)   PRN Meds: acetaminophen **OR** acetaminophen, ALPRAZolam, bisacodyl, morphine injection, ondansetron **OR** ondansetron (ZOFRAN) IV, oxyCODONE, polyethylene glycol, sodium chloride   Vital Signs    Vitals:   12/06/20 0318 12/06/20 0354 12/06/20 0358 12/06/20 0733  BP:  106/65  100/64  Pulse:  82  90  Resp: (P) 19 20  (!) 22  Temp:  99 F (37.2 C)    TempSrc:  Axillary    SpO2:  100%  99%  Weight:   81.2 kg   Height:        Intake/Output Summary (Last 24 hours) at 12/06/2020 0814 Last data filed at 12/06/2020 0610 Gross per 24 hour  Intake 1030.01 ml  Output 1600 ml  Net -569.99 ml   Last 3 Weights 12/06/2020 12/02/2020 12/02/2020  Weight (lbs) 179 lb 0.2 oz 175 lb 11.3 oz 179 lb 3.2 oz  Weight (kg) 81.2 kg 79.7 kg 81.285 kg      Telemetry    Atrial fib rates 80-100 - Personally Reviewed  ECG    No new - Personally  Reviewed  Physical Exam   GEN: No acute distress.   Neck: No JVD Cardiac: iRRR, no murmurs, rubs, or gallops.  Respiratory: Clear to auscultation bilaterally. GI: Soft, nontender, non-distended  MS: No edema; No deformity. Neuro:  Nonfocal  Psych: Normal affect    Labs    High Sensitivity Troponin:   Recent Labs  Lab 12/02/20 1018 12/02/20 1232  TROPONINIHS 27* 27*      Chemistry Recent Labs  Lab 12/02/20 1018 12/03/20 0451 12/04/20 0206 12/05/20 0010 12/05/20 2241  NA 137 141 136 135  --   K 4.1 3.7 3.3* 3.5 3.7  CL 98 101 99 97*  --   CO2 22 28 30 28   --   GLUCOSE 240* 119* 114* 141*  --   BUN 25* 22 20 18   --   CREATININE 1.25* 1.17 0.99 1.20  --   CALCIUM 8.8* 8.5* 8.1* 8.0*  --   PROT 7.4  --   --   --   --   ALBUMIN 3.5  --   --   --   --   AST 34  --   --   --   --  ALT 19  --   --   --   --   ALKPHOS 66  --   --   --   --   BILITOT 1.1  --   --   --   --   GFRNONAA >60 >60 >60 >60  --   ANIONGAP 17* 12 7 10   --      Hematology Recent Labs  Lab 12/04/20 0206 12/05/20 0010 12/06/20 0411  WBC 10.5 10.0 8.2  RBC 4.15* 4.06* 4.13*  HGB 9.6* 9.3* 9.5*  HCT 32.1* 31.6* 32.2*  MCV 77.3* 77.8* 78.0*  MCH 23.1* 22.9* 23.0*  MCHC 29.9* 29.4* 29.5*  RDW 18.4* 18.6* 18.4*  PLT 308 331 292    BNP Recent Labs  Lab 12/02/20 1018 12/06/20 0411  BNP 574.4* 492.4*     DDimer No results for input(s): DDIMER in the last 168 hours.   Radiology    CT CHEST WO CONTRAST  Result Date: 12/04/2020 CLINICAL DATA:  Dyspnea, hypoxia, pulmonary nodule EXAM: CT CHEST WITHOUT CONTRAST TECHNIQUE: Multidetector CT imaging of the chest was performed following the standard protocol without IV contrast. COMPARISON:  12/02/2020, 09/20/2020 FINDINGS: Cardiovascular: Extensive multi-vessel coronary artery calcification. Mild global cardiomegaly. No pericardial effusion. Central pulmonary arteries are enlarged in keeping with changes of pulmonary arterial hypertension.  The thoracic aorta is of normal caliber. Moderate atherosclerotic calcification noted throughout the thoracic aorta. Mediastinum/Nodes: Visualized thyroid is unremarkable. There is extensive mediastinal adenopathy which appears grossly stable since prior examination right hilar adenopathy is also suspected though is difficult to clearly delineate in absence of contrast administration. Precarinal lymph node measures 1.9 x 2.8 cm in greatest dimension, axial # 61. The esophagus is unremarkable. Lungs/Pleura: Interval right thoracentesis. Small pleural fluid persists. Improved aeration of the a right middle and lower lobes with extensive multifocal consolidation persisting in these regions, possibly reflective of superimposed atypical infectious process. There is, additionally, progressive ground-glass infiltrate within the left upper lobe and patchy infiltrate within the right upper lobe. As noted previously, there is engorgement of the central pulmonary vasculature as well as ground-glass pulmonary infiltrate within the lung bases bilaterally as noted interlobular septal thickening suggesting at least some degree of superimposed pulmonary edema, slightly improved since prior examination. No pneumothorax. Central airways are widely patent. Known spiculated mass within the right middle lobe is obscured by multifocal consolidation within this region. Upper Abdomen: No acute abnormality. Musculoskeletal: No acute bone abnormality. Osseous structures are age-appropriate. No lytic or blastic bone lesion. IMPRESSION: Interval right thoracentesis. Small residual pleural fluid. Improved aeration of the right lung base. Findings in keeping with moderate pulmonary edema, improved since prior examination. Superimposed progressive multifocal consolidation within the right lung base and airspace infiltrate within the upper lung zones bilaterally suggestive of a superimposed atypical infectious or inflammatory process. No central  obstructing lesion. Stable mediastinal adenopathy. Extensive multi-vessel coronary artery calcification. Morphologic changes in keeping with pulmonary arterial hypertension. Stable cardiomegaly. Aortic Atherosclerosis (ICD10-I70.0). Electronically Signed   By: Fidela Salisbury MD   On: 12/04/2020 20:43   DG CHEST PORT 1 VIEW  Result Date: 12/04/2020 CLINICAL DATA:  Right thoracentesis, cough, short of breath EXAM: PORTABLE CHEST 1 VIEW COMPARISON:  12/02/2020 FINDINGS: Single frontal view of the chest demonstrates an enlarged cardiac silhouette. Persistent central vascular congestion and interstitial prominence. Decreased right pleural effusion after thoracentesis. Persistent consolidation at the right lung base consistent with atelectasis. No pneumothorax. No acute bony abnormalities. IMPRESSION: 1. No complication after right thoracentesis, with small  residual right pleural effusion identified. 2. Stable right basilar atelectasis. 3. Continued central vascular congestion and interstitial prominence compatible with volume overload. Electronically Signed   By: Randa Ngo M.D.   On: 12/04/2020 18:10   ECHOCARDIOGRAM LIMITED  Result Date: 12/04/2020    ECHOCARDIOGRAM LIMITED REPORT   Patient Name:   CANDIDO FLOTT Date of Exam: 12/04/2020 Medical Rec #:  [id number]          Height:       67.0 in Accession #:    [id number]         Weight:       175.7 lb Date of Birth:  December 03, 1950          BSA:          1.914 m Patient Age:    34 years           BP:           113/71 mmHg Patient Gender: M                  HR:           101 bpm. Exam Location:  Inpatient Procedure: Limited Echo, Cardiac Doppler, Color Doppler and 3D Echo Indications:    R06.02 SOB  History:        Patient has prior history of Echocardiogram examinations, most                 recent 09/25/2020. Abnormal ECG, COPD, Arrythmias:Atrial Flutter,                 Atrial Fibrillation and RBBB, Signs/Symptoms:Shortness of Breath                 and  Dyspnea; Risk Factors:Dyslipidemia. RV failure.  Sonographer:    [place] Referring Phys: [id number] Abigail Butts  Sonographer Comments: Patient in high fowler's position due to hypoxia. Pateint destatted to 63 just moving from chair to bed for echo. IMPRESSIONS  1. Left ventricular ejection fraction, by estimation, is 55 to 60%. The left ventricle has normal function. The left ventricle has no regional wall motion abnormalities. There is moderate concentric left ventricular hypertrophy. Left ventricular diastolic function could not be evaluated. There is the interventricular septum is flattened in systole and diastole, consistent with right ventricular pressure and volume overload.  2. Right ventricular systolic function is severely reduced. The right ventricular size is moderately enlarged. There is moderately elevated pulmonary artery systolic pressure. The estimated right ventricular systolic pressure is 30.8 mmHg.  3. Left atrial size was moderately dilated.  4. Right atrial size was moderately dilated.  5. The mitral valve is normal in structure. Trivial mitral valve regurgitation.  6. The aortic valve is tricuspid. There is mild calcification of the aortic valve. Aortic valve regurgitation is not visualized. Mild aortic valve sclerosis is present, with no evidence of aortic valve stenosis.  7. The inferior vena cava is dilated in size with <50% respiratory variability, suggesting right atrial pressure of 15 mmHg. Comparison(s): No significant change from prior study. Prior images reviewed side by side. FINDINGS  Left Ventricle: Left ventricular ejection fraction, by estimation, is 55 to 60%. The left ventricle has normal function. The left ventricle has no regional wall motion abnormalities. The left ventricular internal cavity size was normal in size. There is  moderate concentric left ventricular hypertrophy. The interventricular septum is flattened in systole and diastole, consistent with right  ventricular pressure and volume overload. Left ventricular  diastolic function could not be evaluated. Left ventricular diastolic function could not be evaluated due to atrial fibrillation. Right Ventricle: The right ventricular size is moderately enlarged. Right ventricular systolic function is severely reduced. There is moderately elevated pulmonary artery systolic pressure. The tricuspid regurgitant velocity is 3.33 m/s, and with an assumed right atrial pressure of 15 mmHg, the estimated right ventricular systolic pressure is 62.9 mmHg. Left Atrium: Left atrial size was moderately dilated. Right Atrium: Right atrial size was moderately dilated. Pericardium: There is no evidence of pericardial effusion. Mitral Valve: The mitral valve is normal in structure. There is mild thickening of the mitral valve leaflet(s). Mild mitral annular calcification. Trivial mitral valve regurgitation. Tricuspid Valve: The tricuspid valve is normal in structure. Tricuspid valve regurgitation is mild. Aortic Valve: The aortic valve is tricuspid. There is mild calcification of the aortic valve. Aortic valve regurgitation is not visualized. Mild aortic valve sclerosis is present, with no evidence of aortic valve stenosis. Pulmonic Valve: The pulmonic valve was normal in structure. Pulmonic valve regurgitation is trivial. Aorta: The aortic root and ascending aorta are structurally normal, with no evidence of dilitation. Venous: The inferior vena cava is dilated in size with less than 50% respiratory variability, suggesting right atrial pressure of 15 mmHg. IAS/Shunts: No atrial level shunt detected by color flow Doppler. Additional Comments: There is pleural effusion in the right lateral region. LEFT VENTRICLE PLAX 2D LVIDd:         3.50 cm LVIDs:         2.60 cm LV PW:         1.60 cm LV IVS:        1.40 cm LVOT diam:     1.90 cm LVOT Area:     2.84 cm  LV Volumes (MOD) LV vol d, MOD A2C: 88.8 ml LV vol d, MOD A4C: 59.3 ml LV vol s, MOD  A2C: 38.6 ml LV vol s, MOD A4C: 28.4 ml LV SV MOD A2C:     50.2 ml LV SV MOD A4C:     59.3 ml LV SV MOD BP:      41.1 ml IVC IVC diam: 2.40 cm LEFT ATRIUM         Index LA diam:    4.60 cm 2.40 cm/m   AORTA Ao Root diam: 3.00 cm Ao Asc diam:  3.90 cm TRICUSPID VALVE TR Peak grad:   44.4 mmHg TR Vmax:        333.00 cm/s  SHUNTS Systemic Diam: 1.90 cm Sanda Klein MD Electronically signed by Sanda Klein MD Signature Date/Time: 12/04/2020/10:04:05 AM    Final     Cardiac Studies   Echo 12/04/20 IMPRESSIONS     1. Left ventricular ejection fraction, by estimation, is 55 to 60%. The  left ventricle has normal function. The left ventricle has no regional  wall motion abnormalities. There is moderate concentric left ventricular  hypertrophy. Left ventricular  diastolic function could not be evaluated. There is the interventricular  septum is flattened in systole and diastole, consistent with right  ventricular pressure and volume overload.   2. Right ventricular systolic function is severely reduced. The right  ventricular size is moderately enlarged. There is moderately elevated  pulmonary artery systolic pressure. The estimated right ventricular  systolic pressure is 52.8 mmHg.   3. Left atrial size was moderately dilated.   4. Right atrial size was moderately dilated.   5. The mitral valve is normal in structure. Trivial mitral valve  regurgitation.  6. The aortic valve is tricuspid. There is mild calcification of the  aortic valve. Aortic valve regurgitation is not visualized. Mild aortic  valve sclerosis is present, with no evidence of aortic valve stenosis.   7. The inferior vena cava is dilated in size with <50% respiratory  variability, suggesting right atrial pressure of 15 mmHg.   Comparison(s): No significant change from prior study. Prior images  reviewed side by side.   FINDINGS   Left Ventricle: Left ventricular ejection fraction, by estimation, is 55  to 60%. The left  ventricle has normal function. The left ventricle has no  regional wall motion abnormalities. The left ventricular internal cavity  size was normal in size. There is   moderate concentric left ventricular hypertrophy. The interventricular  septum is flattened in systole and diastole, consistent with right  ventricular pressure and volume overload. Left ventricular diastolic  function could not be evaluated. Left  ventricular diastolic function could not be evaluated due to atrial  fibrillation.   Right Ventricle: The right ventricular size is moderately enlarged. Right  ventricular systolic function is severely reduced. There is moderately  elevated pulmonary artery systolic pressure. The tricuspid regurgitant  velocity is 3.33 m/s, and with an  assumed right atrial pressure of 15 mmHg, the estimated right ventricular  systolic pressure is 57.3 mmHg.   Left Atrium: Left atrial size was moderately dilated.   Right Atrium: Right atrial size was moderately dilated.   Pericardium: There is no evidence of pericardial effusion.   Mitral Valve: The mitral valve is normal in structure. There is mild  thickening of the mitral valve leaflet(s). Mild mitral annular  calcification. Trivial mitral valve regurgitation.   Tricuspid Valve: The tricuspid valve is normal in structure. Tricuspid  valve regurgitation is mild.   Aortic Valve: The aortic valve is tricuspid. There is mild calcification  of the aortic valve. Aortic valve regurgitation is not visualized. Mild  aortic valve sclerosis is present, with no evidence of aortic valve  stenosis.   Pulmonic Valve: The pulmonic valve was normal in structure. Pulmonic valve  regurgitation is trivial.   Aorta: The aortic root and ascending aorta are structurally normal, with  no evidence of dilitation.   Venous: The inferior vena cava is dilated in size with less than 50%  respiratory variability, suggesting right atrial pressure of 15 mmHg.    IAS/Shunts: No atrial level shunt detected by color flow Doppler.   R/LHC 08/2020: Assessment: 1. 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 There is a large RV marginal branch that is widely patent 2. EF 60-65% 3. Moderate PAH with elevated PVR and moderately reduced cardiac output   Plan/Discussion:   Will manage CAD medically. Will need w/u for PAH and cor pulmonale .   Diagnostic Dominance: Right      RHC 09/2020: Assessment: 1. Mild pulmonary HTN 2. Normal left-sided filling pressures 3. High cardiac output by Fick (suspect to hyperoxygenation with Bipap). Output normal by thermo 4. No evidence of intracardiac shunting.   Plan/Discussion:   Very mild PAH. Normal to high cardiac output. No evidence of intracardiac shunting. Cannot exclude peripheral shunting.   Echocardiogram limited bubble study 09/2020: There is no evidence of right to left shunting at rest or with the  Valsalva maneuver.  The right heart chambers are moderately-to-severely dilated and right  ventricular systolic function is severely decreased.      Patient Profile     70 y.o. male  PMH of CAD, paroxysmal atrial fibrillation, right heart failure, PAH with cor pulmonale, COPD, HTN, HLD, DM type 2, and GI bleed  in May transfused 3 units following for atrial fib and RHF.   Assessment & Plan    Acute on chronic hypoxemic and hypercapnic resp failure  with pl effusion and RH failure.  - neg 4.8 L since admit no weights -on lasix 40 IV BID, continue today, Cr varying but near admit - may want to resume home torsemide in 1-2 days -echo similar to one done 09/2020 -pl effuison with thoracentesis removed about 2.5L at bedside  Persistent atrial fib has been in a fib since May when discharged in a fib -on amio-200 mg  daily -would avoid long term amio use with underlying lung disease if at all possible.  - on heparin, resume eliquis when stable from  PCCM.  PAH 2/2 mod by cath underlying OSA/OHS/hypoxic lung disease  was started on CPAP/Trilogy outpatient and has been tolerating well. -continue sildenafil and cpap  Low BP on midodrine 10 mg TID  CAD  -mostly nonobstructive -no ASA with AC  Anemia per IM Hgb 9.5 down from 11.4  - we may want to consider [facility] referral as outpatient.  Rt middle lobe spiculated nodule per PCCM.        For questions or updates, please contact [place] Please consult [email or web address] for contact info under        Signed, Elouise Munroe, MD  12/05/2020, 9:00 PM
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
## 2020-12-06 NOTE — Progress Notes (Signed)
Occupational Therapy Treatment Patient Details Name: Gary [name] MRN: [id number] DOB: [birth year] Today's Date: 12/06/2020    History of present illness 70 y.o. male presenting to ED 12/02/20 with SOB. Patient admitted with acute on chronic respiratory failure with hypoxia and acute on chronic R-sided heart failure. 7/19 thoracentesis. Previous admission 4/18-5/17 for acute respiratory failure with hypoxia associated with PAH with cor pulmonale. Then went to [facility] 5/17-5/25/22.  PMHx significant for DM, CHF, lung mass, HTN, CHF, and COPD on home O2.   OT comments  Pt received sitting EOB upon arrival, agreeable to OT session this AM. Pt is progressing toward established goals. Pt engaged in toileting ( min A ), BSC transfer (stand pivot min guard), washing hands in sitting (set up). Pt did not c/o SOB or fatigue this session, however, noted to desat with functional transfer from [facility] to chair. Pt on 13L HFNC (values listed below) with improved recovery with rest and engagement in learned pursed lip breathing with min verbal cueing. Pt will benefit to continue acute level OT to address established goals and to maximize independence with ADLs prior to DC. DC recommendation and frequency remains the same.     Follow Up Recommendations  No OT follow up;Other (comment) (Family declining [place] but would like HHPT)    Equipment Recommendations  None recommended by OT (Patient has necessary DME)    Recommendations for Other Services      Precautions / Restrictions Precautions Precautions: Fall Precaution Comments: Monitor HR and SpO2 Restrictions Weight Bearing Restrictions: Yes       Mobility Bed Mobility Overal bed mobility: Needs Assistance             General bed mobility comments: Pt received seated EOB upon arrival.    Transfers Overall transfer level: Needs assistance Equipment used: Rolling walker (2 wheeled) Transfers: Sit to/from Stand Sit to Stand: Min assist Stand  pivot transfers: Min assist       General transfer comment: Min assist for safety, cues for safety awareness and use of AE for functional transfers.    Balance Overall balance assessment: Needs assistance Sitting-balance support: Feet supported;No upper extremity supported Sitting balance-Leahy Scale: Good     Standing balance support: Bilateral upper extremity supported;Single extremity supported Standing balance-Leahy Scale: Poor Standing balance comment: needs external support in standing.                           ADL either performed or assessed with clinical judgement   ADL Overall ADL's : Needs assistance/impaired     Grooming: Wash/dry hands;Set up;Sitting Grooming Details (indicate cue type and reason): pt presented with wash cloth to wash hands after toileting.                 Toilet Transfer: Magazine features editor Details (indicate cue type and reason): Pt received requiring toileting, min guard stand pivot to BSC from EOB. Pt on 13L HFNC, intially at 100% SpO2 and decreased to 89% with transfer. Educated to perform pursed lip breathing techniques with cues for proper sequencing. Toileting- Clothing Manipulation and Hygiene: Cueing for safety;Cueing for sequencing;Sit to/from stand;Minimal assistance Toileting - Clothing Manipulation Details (indicate cue type and reason): Pt stood from [facility] with RW for safety to perform hygiene, min A for clothing management of gown.     Functional mobility during ADLs: Min guard;Rolling walker General ADL Comments: Once presented to recliner after toileting, pt decrease to 80% SpO2, however, able to  return to 94%  with rest and use of breathing strategies, decreased cues for sequencing with this attempt of pursed lip breathing.     Vision       Perception     Praxis      Cognition Arousal/Alertness: Awake/alert Behavior During Therapy: WFL for tasks assessed/performed Overall Cognitive Status: Within  Functional Limits for tasks assessed                                          Exercises     Shoulder Instructions       General Comments Pt on 13L HFNC with O2 range 90-97%, pt did desat with stand pivot transfer back to chair however, able to improve with rest and breathing. HR range of 87-91 throughout session.    Pertinent Vitals/ Pain       Pain Assessment: No/denies pain  Home Living                                          Prior Functioning/Environment              Frequency  Min 2X/week        Progress Toward Goals  OT Goals(current goals can now be found in the care plan section)  Progress towards OT goals: Progressing toward goals  Acute Rehab OT Goals Patient Stated Goal: To return home. OT Goal Formulation: With patient Time For Goal Achievement: 12/17/20 Potential to Achieve Goals: Good ADL Goals Additional ADL Goal #1: Patient will complete ADLs with supervision A grossly with SpO2 >90% on 3L O2 vi [place] in prep for safe d/c home. Additional ADL Goal #2: Patient will independently recall 3 energy conservation techinques in prep for ADLs/IADLs. Additional ADL Goal #3: Patient will recall 3 techniques to decrease risk of falls in prep for safe return home.  Plan Discharge plan remains appropriate;Frequency remains appropriate    Co-evaluation                 AM-PAC OT "6 Clicks" Daily Activity     Outcome Measure   Help from another person eating meals?: None Help from another person taking care of personal grooming?: A Little Help from another person toileting, which includes using toliet, bedpan, or urinal?: A Little Help from another person bathing (including washing, rinsing, drying)?: A Little Help from another person to put on and taking off regular upper body clothing?: A Little Help from another person to put on and taking off regular lower body clothing?: A Little 6 Click Score: 19    End of  Session Equipment Utilized During Treatment: Gait belt;Oxygen  OT Visit Diagnosis: Unsteadiness on feet (R26.81);Muscle weakness (generalized) (M62.81)   Activity Tolerance Patient tolerated treatment well;Other (comment) (Session limited 2/2 vitals)   Patient Left in chair;with call bell/phone within reach   Nurse Communication Mobility status        Time: 1829-9371 OT Time Calculation (min): 32 min  Charges: OT General Charges $OT Visit: 1 Visit OT Treatments $Self Care/Home Management : 23-37 mins  Minus Breeding, MSOT, OTR/L  Supplemental Rehabilitation Services  [phone]    Marius Ditch 12/06/2020, 12:07 PM

## 2020-12-07 ENCOUNTER — Inpatient Hospital Stay (HOSPITAL_COMMUNITY): Payer: Medicare Other

## 2020-12-07 DIAGNOSIS — J449 Chronic obstructive pulmonary disease, unspecified: Secondary | ICD-10-CM

## 2020-12-07 DIAGNOSIS — I4819 Other persistent atrial fibrillation: Secondary | ICD-10-CM | POA: Diagnosis not present

## 2020-12-07 DIAGNOSIS — R0902 Hypoxemia: Secondary | ICD-10-CM | POA: Diagnosis not present

## 2020-12-07 DIAGNOSIS — Z9889 Other specified postprocedural states: Secondary | ICD-10-CM

## 2020-12-07 DIAGNOSIS — J9622 Acute and chronic respiratory failure with hypercapnia: Secondary | ICD-10-CM | POA: Diagnosis not present

## 2020-12-07 DIAGNOSIS — J9621 Acute and chronic respiratory failure with hypoxia: Secondary | ICD-10-CM | POA: Diagnosis not present

## 2020-12-07 DIAGNOSIS — I509 Heart failure, unspecified: Secondary | ICD-10-CM | POA: Diagnosis not present

## 2020-12-07 LAB — GLUCOSE, CAPILLARY
Glucose-Capillary: 237 mg/dL — ABNORMAL HIGH (ref 70–99)
Glucose-Capillary: 316 mg/dL — ABNORMAL HIGH (ref 70–99)
Glucose-Capillary: 394 mg/dL — ABNORMAL HIGH (ref 70–99)
Glucose-Capillary: 177 mg/dL — ABNORMAL HIGH (ref 70–99)

## 2020-12-07 LAB — BASIC METABOLIC PANEL
Anion gap: 9 (ref 5–15)
BUN: 37 mg/dL — ABNORMAL HIGH (ref 8–23)
CO2: 29 mmol/L (ref 22–32)
Calcium: 8.4 mg/dL — ABNORMAL LOW (ref 8.9–10.3)
Chloride: 93 mmol/L — ABNORMAL LOW (ref 98–111)
Creatinine, Ser: 1 mg/dL (ref 0.61–1.24)
GFR, Estimated: >60 mL/min (ref >60)
Glucose, Bld: 257 mg/dL — ABNORMAL HIGH (ref 70–99)
Potassium: 4.2 mmol/L (ref 3.5–5.1)
Sodium: 131 mmol/L — ABNORMAL LOW (ref 135–145)

## 2020-12-07 LAB — CBC
HCT: 30.4 % — ABNORMAL LOW (ref 39.0–52.0)
Hemoglobin: 8.8 g/dL — ABNORMAL LOW (ref 13.0–17.0)
MCH: 22.6 pg — ABNORMAL LOW (ref 26.0–34.0)
MCHC: 28.9 g/dL — ABNORMAL LOW (ref 30.0–36.0)
MCV: 77.9 fL — ABNORMAL LOW (ref 80.0–100.0)
Platelets: 324 10*3/uL (ref 150–400)
RBC: 3.9 MIL/uL — ABNORMAL LOW (ref 4.22–5.81)
RDW: 18.1 % — ABNORMAL HIGH (ref 11.5–15.5)
WBC: 8.4 10*3/uL (ref 4.0–10.5)
nRBC: 0 % (ref 0.0–0.2)

## 2020-12-07 LAB — HEPARIN LEVEL (UNFRACTIONATED): Heparin Unfractionated: 0.78 IU/mL — ABNORMAL HIGH (ref 0.30–0.70)

## 2020-12-07 LAB — MAGNESIUM: Magnesium: 2 mg/dL (ref 1.7–2.4)

## 2020-12-07 LAB — APTT: aPTT: 86 seconds — ABNORMAL HIGH (ref 24–36)

## 2020-12-07 MED ORDER — INSULIN ASPART 100 UNIT/ML IJ SOLN
4.0000 [IU] | Freq: Three times a day (TID) | INTRAMUSCULAR | Status: DC
  Administered 2020-12-07 – 2020-12-08 (×4): 4 [IU] via SUBCUTANEOUS

## 2020-12-07 MED ORDER — TORSEMIDE 20 MG PO TABS
20.0000 mg | ORAL_TABLET | Freq: Every day | ORAL | Status: DC
  Administered 2020-12-07 – 2020-12-08 (×2): 20 mg via ORAL
  Filled 2020-12-07 (×2): qty 1

## 2020-12-07 MED ORDER — ENSURE ENLIVE PO LIQD
237.0000 mL | Freq: Three times a day (TID) | ORAL | Status: DC
  Administered 2020-12-07 – 2020-12-09 (×5): 237 mL via ORAL
  Filled 2020-12-07 (×2): qty 237

## 2020-12-07 MED ORDER — PREDNISONE 20 MG PO TABS
40.0000 mg | ORAL_TABLET | Freq: Every day | ORAL | Status: DC
  Administered 2020-12-08 – 2020-12-11 (×4): 40 mg via ORAL
  Filled 2020-12-07 (×4): qty 2

## 2020-12-07 NOTE — Consult Note (Signed)
   [facility] Inpatient Consult   12/07/2020  [facility] 1951-02-11 [id number]  [place] [facility] [ACO] Patient: Medicare CMS DCE  Primary Care Provider:  Jalene Mullet, PA-C  Patient screened for hospitalization with noted extreme high risk score for unplanned readmission risk and to assess for potential [place] Management service needs for post hospital transition.  Review of patient's medical record reveals patient is weaning oxygen down if possible.  Plan:  Continue to follow progress and disposition to assess for post hospital care management needs.    For questions contact:   Natividad Brood, RN BSN [facility] Liaison  [phone] business mobile phone Toll free office [phone]  Fax number: [phone] [email or web address]

## 2020-12-07 NOTE — Progress Notes (Signed)
Inpatient Diabetes Program Recommendations  AACE/ADA: New Consensus Statement on Inpatient Glycemic Control   Target Ranges:  Prepandial:   less than 140 mg/dL      Peak postprandial:   less than 180 mg/dL (1-2 hours)      Critically ill patients:  140 - 180 mg/dL   Results for Gary [name], Gary [name] (MRN [id number]) as of 12/07/2020 11:09  Ref. Range 12/06/2020 07:57 12/06/2020 11:49 12/06/2020 16:14 12/06/2020 20:37 12/07/2020 07:22  Glucose-Capillary Latest Ref Range: 70 - 99 mg/dL 185 (H) 339 (H) 375 (H) 303 (H) 237 (H)    Review of Glycemic Control  Diabetes history: DM2 Outpatient Diabetes medications: Metformin 1000 mg BID, Jardiance 10 mg daily Current orders for Inpatient glycemic control: Novolog 0-15 units TID with meals, Novolog 0-5 units QHS; Prednisone 40 mg QAM  Inpatient Diabetes Program Recommendations:    Insulin: Noted steroids decreased from Solumedrol to Prednisone.  If steroids are continued and post prandial glucose is consistently over 180 mg/dl, please consider ordering Novolog 4 units TID with meals for meal coverage if patient eats at least 50% of meals.  Thanks, Barnie Alderman, RN, MSN, CDE Diabetes Coordinator Inpatient Diabetes Program [phone] (Team Pager from 8am to 5pm)

## 2020-12-07 NOTE — Telephone Encounter (Signed)
The ct has been resc I will send this back to triage since the nurse called the DME

## 2020-12-07 NOTE — Plan of Care (Signed)

## 2020-12-07 NOTE — Plan of Care (Signed)

## 2020-12-07 NOTE — Progress Notes (Signed)
PROGRESS NOTE    Gary [name]  AJG:[id number] DOB: 1951/01/04 DOA: 12/02/2020 PCP: Jalene Mullet, PA-C   Brief Narrative: Gary [name] is a 70 y.o. male with history of diabetes, hypertension, hyperlipidemia, congestive heart failure, COPD on home oxygen and trilogy machine for chronic respiratory failure with hypoxia and hypercapnia.  Patient presented secondary to shortness of breath with evidence of acute on chronic respiratory failure.  Patient required high flow nasal cannula.  Hospitalization complicated by acute on chronic right heart failure, atrial fibrillation with RVR, worsening of underlying lung disease.  Patient is currently on diuresis and has received a right thoracentesis to help with a large right pleural effusion.  Patient also noted to have a concerning right middle lobe mass.  Attempting to wean patient's oxygen use.    Assessment & Plan:   Principal Problem:   Acute on chronic respiratory failure with hypoxia (HCC) Active Problems:   Hyperlipidemia due to type 2 diabetes mellitus (HCC)   Atrial fibrillation (HCC)   Acute on chronic right heart failure (HCC)   COPD with hypoxia (HCC)   Lung mass   S/P thoracentesis   Acute on chronic respiratory failure with hypoxia and hypercapnia COPD CT chest significant for right pleural effusion, right middle lobe mass and right airspace consolidation. Thoracentesis performed on 7/19 significant for likely exudative fluid; culture with no growth to date.  Patient reports feeling a little better since the thoracentesis and is improving with Lasix diuresis. Patient was empirically treated with cefepime IV which was discontinued after negative procalcitonin. I was able to wean him down to 6 lpm at bedside on 7/21 but he has increased back to 8-10 lpm today. It is possible he has reaccumulated fluid -Wean oxygen to home 4-5 Lpm -Pulmonology recommendations: Continue bronchodilators -Continue Incruse Ellipta, Breo  Ellipta -2 view chest x-ray  Right lung mass Associated mediastinal adenopathy.  Cytology is pending from pleural effusion.  Unable to biopsy secondary to severe hypoxia. Cytology without evidence of malignant cells -Pulmonology recommendations: considering outpatient biopsy  Atrial fibrillation with RVR Patient is on Eliquis, amiodarone and Cardizem as an outpatient. Started on Cardizem IV and now transitioned to PO. Eliquis on hold secondary to procedures and patient started on heparin drip. Cardiology consulted for management -Cardiology recommendations: resume Eliquis per PCCM  Acute on chronic right-sided heart failure Patient is on Demadex as an outpatient. Currently on Lasix -Start home torsemide  Pulmonary artery hypertension OSA/OHS -Continue Revatio -Continue trilogy nightly  Hypotension Chronic. Patient is on midodrine as an outpatient. Complicated by above problems. -Continue midodrine  Hyperlipidemia -Continue Lipitor  Microcytic anemia Patient is on iron supplementation as an outpatient. Stable.  Hypokalemia Hypomagnesemia Replete as needed.  Diabetes mellitus, type 2 Hemoglobin A1C of 5.2%. Patient is on Jardiance, metformin as an outpatient. Started on SSI inpatient. -Continue SSI  DVT prophylaxis: Heparin IV Code Status:   Code Status: Full Code Family Communication: None at bedside Disposition Plan: Discharge home (HHPT) pending ability to wean oxygen as his requirement has significantly increased   Consultants:  Pulmonology Cardiology  Procedures:    Antimicrobials: Cefepime IV   Subjective: Feels stable. No dyspnea. Oxygen requirement increased significantly overnight.  Objective: Vitals:   12/07/20 1207 12/07/20 1208 12/07/20 1209 12/07/20 1211  BP:      Pulse: 87 88 87 89  Resp: (!) 24 (!) 28 (!) 27 18  Temp:      TempSrc:      SpO2: (!) 88% (!) 88%  90% 91%  Weight:      Height:        Intake/Output Summary (Last 24 hours)  at 12/07/2020 1718 Last data filed at 12/07/2020 1253 Gross per 24 hour  Intake 600 ml  Output 700 ml  Net -100 ml    Filed Weights   12/02/20 1906 12/06/20 0358 12/07/20 0358  Weight: 79.7 kg 81.2 kg 83 kg    Examination:  General exam: Appears calm and comfortable Respiratory system: Rales at bases. Respiratory effort normal. Cardiovascular system: S1 & S2 heard, RRR. No murmurs, rubs, gallops or clicks. Gastrointestinal system: Abdomen is nondistended, soft and nontender. No organomegaly or masses felt. Normal bowel sounds heard. Central nervous system: Alert and oriented. No focal neurological deficits. Musculoskeletal: No calf tenderness Skin: No cyanosis. No rashes Psychiatry: Judgement and insight appear normal. Mood & affect appropriate.     Data Reviewed: I have personally reviewed following labs and imaging studies  CBC Lab Results  Component Value Date   WBC 8.4 12/07/2020   RBC 3.90 (L) 12/07/2020   HGB 8.8 (L) 12/07/2020   HCT 30.4 (L) 12/07/2020   MCV 77.9 (L) 12/07/2020   MCH 22.6 (L) 12/07/2020   PLT 324 12/07/2020   MCHC 28.9 (L) 12/07/2020   RDW 18.1 (H) 12/07/2020   LYMPHSABS 0.9 12/04/2020   MONOABS 1.0 12/04/2020   EOSABS 0.1 12/04/2020   BASOSABS 0.0 46/50/3546     Last metabolic panel Lab Results  Component Value Date   NA 131 (L) 12/07/2020   K 4.2 12/07/2020   CL 93 (L) 12/07/2020   CO2 29 12/07/2020   BUN 37 (H) 12/07/2020   CREATININE 1.00 12/07/2020   GLUCOSE 257 (H) 12/07/2020   GFRNONAA >60 12/07/2020   GFRAA 80 (L) 05/14/2013   CALCIUM 8.4 (L) 12/07/2020   PROT 7.4 12/02/2020   ALBUMIN 3.5 12/02/2020   BILITOT 1.1 12/02/2020   ALKPHOS 66 12/02/2020   AST 34 12/02/2020   ALT 19 12/02/2020   ANIONGAP 9 12/07/2020    CBG (last 3)  Recent Labs    12/07/20 0722 12/07/20 1135 12/07/20 1547  GLUCAP 237* 316* 394*      GFR: Estimated Creatinine Clearance: 68.9 mL/min (by C-G formula based on SCr of 1  mg/dL).  Coagulation Profile: No results for input(s): INR, PROTIME in the last 168 hours.  Recent Results (from the past 240 hour(s))  Resp Panel by RT-PCR (Flu A&B, Covid) Nasopharyngeal Swab     Status: None   Collection Time: 12/02/20 10:18 AM   Specimen: Nasopharyngeal Swab; Nasopharyngeal(NP) swabs in vial transport medium  Result Value Ref Range Status   SARS Coronavirus 2 by RT PCR NEGATIVE NEGATIVE Final    Comment: (NOTE) SARS-CoV-2 target nucleic acids are NOT DETECTED.  The SARS-CoV-2 RNA is generally detectable in upper respiratory specimens during the acute phase of infection. The lowest concentration of SARS-CoV-2 viral copies this assay can detect is 138 copies/mL. A negative result does not preclude SARS-Cov-2 infection and should not be used as the sole basis for treatment or other patient management decisions. A negative result may occur with  improper specimen collection/handling, submission of specimen other than nasopharyngeal swab, presence of viral mutation(s) within the areas targeted by this assay, and inadequate number of viral copies(<138 copies/mL). A negative result must be combined with clinical observations, patient history, and epidemiological information. The expected result is Negative.  Fact Sheet for Patients:  EntrepreneurPulse.com.au  Fact Sheet for Healthcare Providers:  IncredibleEmployment.be  This test is no t yet approved or cleared by the [place] and  has been authorized for detection and/or diagnosis of SARS-CoV-2 by FDA under an Emergency Use Authorization (EUA). This EUA will remain  in effect (meaning this test can be used) for the duration of the COVID-19 declaration under Section 564(b)(1) of the Act, 21 U.S.C.section 360bbb-3(b)(1), unless the authorization is terminated  or revoked sooner.       Influenza A by PCR NEGATIVE NEGATIVE Final   Influenza B by PCR NEGATIVE NEGATIVE  Final    Comment: (NOTE) The Xpert Xpress SARS-CoV-2/FLU/RSV plus assay is intended as an aid in the diagnosis of influenza from Nasopharyngeal swab specimens and should not be used as a sole basis for treatment. Nasal washings and aspirates are unacceptable for Xpert Xpress SARS-CoV-2/FLU/RSV testing.  Fact Sheet for Patients: EntrepreneurPulse.com.au  Fact Sheet for Healthcare Providers: IncredibleEmployment.be  This test is not yet approved or cleared by the [place] FDA and has been authorized for detection and/or diagnosis of SARS-CoV-2 by FDA under an Emergency Use Authorization (EUA). This EUA will remain in effect (meaning this test can be used) for the duration of the COVID-19 declaration under Section 564(b)(1) of the Act, 21 U.S.C. section 360bbb-3(b)(1), unless the authorization is terminated or revoked.  Performed at [facility], [place] [address], [place], [place] [address]   Body fluid culture w Gram Stain     Status: None (Preliminary result)   Collection Time: 12/04/20  2:58 PM   Specimen: Body Fluid  Result Value Ref Range Status   Specimen Description FLUID RIGHT PLEURAL  Final   Special Requests NONE  Final   Gram Stain   Final    FEW WBC PRESENT, PREDOMINANTLY MONONUCLEAR NO ORGANISMS SEEN    Culture   Final    NO GROWTH 3 DAYS Performed at [facility], [address] [place], [place] [address]    Report Status PENDING  Incomplete         Radiology Studies: No results found.      Scheduled Meds:  amiodarone  200 mg Oral Daily   aspirin EC  81 mg Oral q morning   atorvastatin  40 mg Oral QPM   busPIRone  5 mg Oral BID   diltiazem  30 mg Oral Q6H   docusate sodium  100 mg Oral BID   feeding supplement  237 mL Oral TID BM   ferrous sulfate  325 mg Oral Q breakfast   fluticasone furoate-vilanterol  1 puff Inhalation Daily   insulin aspart  0-15 Units Subcutaneous TID WC   insulin  aspart  0-5 Units Subcutaneous QHS   midodrine  10 mg Oral TID WC   pantoprazole  40 mg Oral Daily   polyethylene glycol  17 g Oral Daily   [START ON 12/08/2020] predniSONE  40 mg Oral Q breakfast   sildenafil  40 mg Oral TID   sodium chloride flush  3 mL Intravenous Q12H   umeclidinium bromide  1 puff Inhalation Daily   Continuous Infusions:  heparin 1,800 Units/hr (12/07/20 1053)     LOS: 5 days     Cordelia Poche, MD [facility] 12/07/2020, 5:18 PM  If 7PM-7AM, please contact night-coverage [email or web address]

## 2020-12-07 NOTE — Progress Notes (Signed)
Progress Note  Patient Name: Gary [name] Date of Encounter: 12/07/2020  [facility] Cardiologist: Elouise Munroe, MD   Subjective   Feels ready to go home. Much better than admission.   Inpatient Medications    Scheduled Meds:  amiodarone  200 mg Oral Daily   aspirin EC  81 mg Oral q morning   atorvastatin  40 mg Oral QPM   busPIRone  5 mg Oral BID   diltiazem  30 mg Oral Q6H   docusate sodium  100 mg Oral BID   feeding supplement  237 mL Oral BID BM   ferrous sulfate  325 mg Oral Q breakfast   fluticasone furoate-vilanterol  1 puff Inhalation Daily   insulin aspart  0-15 Units Subcutaneous TID WC   insulin aspart  0-5 Units Subcutaneous QHS   methylPREDNISolone (SOLU-MEDROL) injection  60 mg Intravenous Q24H   midodrine  10 mg Oral TID WC   pantoprazole  40 mg Oral Daily   polyethylene glycol  17 g Oral Daily   sildenafil  40 mg Oral TID   sodium chloride flush  3 mL Intravenous Q12H   umeclidinium bromide  1 puff Inhalation Daily   Continuous Infusions:  heparin 1,800 Units/hr (12/06/20 1947)   PRN Meds: acetaminophen **OR** acetaminophen, ALPRAZolam, bisacodyl, morphine injection, ondansetron **OR** ondansetron (ZOFRAN) IV, oxyCODONE, polyethylene glycol, sodium chloride   Vital Signs    Vitals:   12/07/20 0358 12/07/20 0652 12/07/20 0721 12/07/20 0755  BP:  110/60 120/62 120/62  Pulse:   88 81  Resp:   (!) 27 (!) 24  Temp:   98.2 F (36.8 C)   TempSrc:   Axillary   SpO2:   90% 90%  Weight: 83 kg     Height:        Intake/Output Summary (Last 24 hours) at 12/07/2020 0949 Last data filed at 12/06/2020 1900 Gross per 24 hour  Intake 720 ml  Output 950 ml  Net -230 ml   Last 3 Weights 12/07/2020 12/06/2020 12/02/2020  Weight (lbs) 182 lb 15.7 oz 179 lb 0.2 oz 175 lb 11.3 oz  Weight (kg) 83 kg 81.2 kg 79.7 kg      Telemetry    SR with RBBB Personally Reviewed  ECG    SR RBBB T wave abnl- Personally Reviewed  Physical Exam   GEN: No  acute distress.   Neck: No JVD Cardiac: RRR, no murmurs, rubs, or gallops.  Respiratory: Clear to auscultation bilaterally. GI: Soft, nontender, non-distended  MS: No edema; No deformity. Neuro:  Nonfocal  Psych: Normal affect   Labs    High Sensitivity Troponin:   Recent Labs  Lab 12/02/20 1018 12/02/20 1232  TROPONINIHS 27* 27*      Chemistry Recent Labs  Lab 12/02/20 1018 12/03/20 0451 12/05/20 0010 12/05/20 2241 12/06/20 1303 12/07/20 0243  NA 137   < > 135  --  131* 131*  K 4.1   < > 3.5 3.7 3.9 4.2  CL 98   < > 97*  --  94* 93*  CO2 22   < > 28  --  28 29  GLUCOSE 240*   < > 141*  --  374* 257*  BUN 25*   < > 18  --  31* 37*  CREATININE 1.25*   < > 1.20  --  1.09 1.00  CALCIUM 8.8*   < > 8.0*  --  8.2* 8.4*  PROT 7.4  --   --   --   --   --  ALBUMIN 3.5  --   --   --   --   --   AST 34  --   --   --   --   --   ALT 19  --   --   --   --   --   ALKPHOS 66  --   --   --   --   --   BILITOT 1.1  --   --   --   --   --   GFRNONAA >60   < > >60  --  >60 >60  ANIONGAP 17*   < > 10  --  9 9   < > = values in this interval not displayed.     Hematology Recent Labs  Lab 12/05/20 0010 12/06/20 0411 12/07/20 0243  WBC 10.0 8.2 8.4  RBC 4.06* 4.13* 3.90*  HGB 9.3* 9.5* 8.8*  HCT 31.6* 32.2* 30.4*  MCV 77.8* 78.0* 77.9*  MCH 22.9* 23.0* 22.6*  MCHC 29.4* 29.5* 28.9*  RDW 18.6* 18.4* 18.1*  PLT 331 292 324    BNP Recent Labs  Lab 12/02/20 1018 12/06/20 0411  BNP 574.4* 492.4*     DDimer No results for input(s): DDIMER in the last 168 hours.   Radiology    No results found.  Cardiac Studies   Echo 12/04/20 IMPRESSIONS     1. Left ventricular ejection fraction, by estimation, is 55 to 60%. The  left ventricle has normal function. The left ventricle has no regional  wall motion abnormalities. There is moderate concentric left ventricular  hypertrophy. Left ventricular  diastolic function could not be evaluated. There is the interventricular   septum is flattened in systole and diastole, consistent with right  ventricular pressure and volume overload.   2. Right ventricular systolic function is severely reduced. The right  ventricular size is moderately enlarged. There is moderately elevated  pulmonary artery systolic pressure. The estimated right ventricular  systolic pressure is 77.9 mmHg.   3. Left atrial size was moderately dilated.   4. Right atrial size was moderately dilated.   5. The mitral valve is normal in structure. Trivial mitral valve  regurgitation.   6. The aortic valve is tricuspid. There is mild calcification of the  aortic valve. Aortic valve regurgitation is not visualized. Mild aortic  valve sclerosis is present, with no evidence of aortic valve stenosis.   7. The inferior vena cava is dilated in size with <50% respiratory  variability, suggesting right atrial pressure of 15 mmHg.   Comparison(s): No significant change from prior study. Prior images  reviewed side by side.   FINDINGS   Left Ventricle: Left ventricular ejection fraction, by estimation, is 55  to 60%. The left ventricle has normal function. The left ventricle has no  regional wall motion abnormalities. The left ventricular internal cavity  size was normal in size. There is   moderate concentric left ventricular hypertrophy. The interventricular  septum is flattened in systole and diastole, consistent with right  ventricular pressure and volume overload. Left ventricular diastolic  function could not be evaluated. Left  ventricular diastolic function could not be evaluated due to atrial  fibrillation.   Right Ventricle: The right ventricular size is moderately enlarged. Right  ventricular systolic function is severely reduced. There is moderately  elevated pulmonary artery systolic pressure. The tricuspid regurgitant  velocity is 3.33 m/s, and with an  assumed right atrial pressure of 15 mmHg, the estimated right ventricular  systolic pressure is 31.5 mmHg.   Left Atrium: Left atrial size was moderately dilated.   Right Atrium: Right atrial size was moderately dilated.   Pericardium: There is no evidence of pericardial effusion.   Mitral Valve: The mitral valve is normal in structure. There is mild  thickening of the mitral valve leaflet(s). Mild mitral annular  calcification. Trivial mitral valve regurgitation.   Tricuspid Valve: The tricuspid valve is normal in structure. Tricuspid  valve regurgitation is mild.   Aortic Valve: The aortic valve is tricuspid. There is mild calcification  of the aortic valve. Aortic valve regurgitation is not visualized. Mild  aortic valve sclerosis is present, with no evidence of aortic valve  stenosis.   Pulmonic Valve: The pulmonic valve was normal in structure. Pulmonic valve  regurgitation is trivial.   Aorta: The aortic root and ascending aorta are structurally normal, with  no evidence of dilitation.   Venous: The inferior vena cava is dilated in size with less than 50%  respiratory variability, suggesting right atrial pressure of 15 mmHg.   IAS/Shunts: No atrial level shunt detected by color flow Doppler.   R/LHC 08/2020: Assessment: 1. 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 There is a large RV marginal branch that is widely patent 2. EF 60-65% 3. Moderate PAH with elevated PVR and moderately reduced cardiac output   Plan/Discussion:   Will manage CAD medically. Will need w/u for PAH and cor pulmonale .   Diagnostic Dominance: Right      RHC 09/2020: Assessment: 1. Mild pulmonary HTN 2. Normal left-sided filling pressures 3. High cardiac output by Fick (suspect to hyperoxygenation with Bipap). Output normal by thermo 4. No evidence of intracardiac shunting.   Plan/Discussion:   Very mild PAH. Normal to high cardiac output. No evidence of intracardiac shunting. Cannot exclude peripheral  shunting.   Echocardiogram limited bubble study 09/2020: There is no evidence of right to left shunting at rest or with the  Valsalva maneuver.  The right heart chambers are moderately-to-severely dilated and right  ventricular systolic function is severely decreased.      Patient Profile     70 y.o. male  PMH of CAD, paroxysmal atrial fibrillation, right heart failure, PAH with cor pulmonale, COPD, HTN, HLD, DM type 2, and GI bleed  in May transfused 3 units following for atrial fib and RHF.   Assessment & Plan    Acute on chronic hypoxemic and hypercapnic resp failure  with pl effusion and RH failure.  - neg 5.4 L since admit, weights are inaccurate and not obtained -torsemide 20 mg daily - home dose.  -echo similar to one done 09/2020 -pl effuison with thoracentesis removed about 2.5L at bedside, feels this was the most helpful.  Cytology with no malignant cells.  Paroxysmal atrial fib - back in sinus rhythm and symptomatically better. Suspect he went into afib 7-10 days ago when he first became symptomatic and that may have precipitate HF.  -on amio 200 mg  daily -would avoid long term amio use with underlying lung disease if at all possible - look forward to thoughts from Dr. Valeta Harms at [facility] follow up. - worsening anemia, would consider holding heparin with history of GI bleed and monitoring hemoglobin. Resume eliquis when hemoglobin stable.  - No procedures planned as PCCM has signed off yesterday.  PAH 2/2 mod by cath underlying OSA/OHS/hypoxic lung disease  was started on CPAP/Trilogy outpatient and has been tolerating well. -continue sildenafil  and cpap  Low BP on midodrine 10 mg TID  CAD  -mostly nonobstructive -no ASA with AC  Anemia per IM Hgb 8.8 down from 11.4  -needs review by IM for recurrent bleeding.  - we may want to consider [facility] referral as outpatient.  Rt middle lobe spiculated nodule per PCCM.    For questions or updates, please contact [place] Please consult [email or web address] for contact info under        Signed, Elouise Munroe, MD  12/05/2020, 9:00 PM

## 2020-12-07 NOTE — Progress Notes (Signed)
Physical Therapy Treatment Patient Details Name: Gary [name] MRN: [id number] DOB: [birth year] Today's Date: 12/07/2020    History of Present Illness 70 y.o. male presenting to ED 12/02/20 with SOB. Patient admitted with acute on chronic respiratory failure with hypoxia and acute on chronic R-sided heart failure. 7/19 thoracentesis. Previous admission 4/18-5/17 for acute respiratory failure with hypoxia associated with PAH with cor pulmonale. Then went to [facility] 5/17-5/25/22.  PMHx significant for DM, CHF, lung mass, HTN, CHF, and COPD on home O2.    PT Comments    Pt was seen for progression of gait and note that he continues to struggle to maintain O2 sats, with both trips on walker resulting in drop to 79%.  Pulse was 137 briefly at end of session but afterward quickly dropped to 110.   Pt is appropriate to go home but will also have his wife to care for him who has some medical experience and training for monitoring him.  HHPT to follow along for observation and care of his medical changes with moving.  See for acute PT goals as are below.  Follow Up Recommendations  Home health PT     Equipment Recommendations  None recommended by PT    Recommendations for Other Services       Precautions / Restrictions Precautions Precautions: Fall Precaution Comments: Monitor HR and SpO2 Restrictions Weight Bearing Restrictions: No    Mobility  Bed Mobility Overal bed mobility: Needs Assistance             General bed mobility comments: in chair when PT arrived    Transfers Overall transfer level: Needs assistance Equipment used: Rolling walker (2 wheeled) Transfers: Sit to/from Stand Sit to Stand: Min guard         General transfer comment: pt requires help for lines and safety but can stand well with minmal help  Ambulation/Gait Ambulation/Gait assistance: Min guard Gait Distance (Feet): 80 Feet Assistive device: Rolling walker (2 wheeled) Gait Pattern/deviations:  Step-through pattern;Decreased stride length;Trunk flexed Gait velocity: reduced   General Gait Details: requires 15 L O2 and slow pace without talking and still desats to 79%   Stairs             Wheelchair Mobility    Modified Rankin (Stroke Patients Only)       Balance Overall balance assessment: Needs assistance Sitting-balance support: Feet supported Sitting balance-Leahy Scale: Good     Standing balance support: Bilateral upper extremity supported;During functional activity Standing balance-Leahy Scale: Fair Standing balance comment: less than fair dynamically                            Cognition Arousal/Alertness: Awake/alert Behavior During Therapy: WFL for tasks assessed/performed Overall Cognitive Status: Within Functional Limits for tasks assessed                                        Exercises      General Comments General comments (skin integrity, edema, etc.): pt is up to walk with supervision to help with lines, and is expecting to leave soon.  Has difficulty controlling his O2 sats, drops even on 15L      Pertinent Vitals/Pain Pain Assessment: Faces Faces Pain Scale: Hurts little more Pain Location: back and neck Pain Descriptors / Indicators: Grimacing;Guarding Pain Intervention(s): Limited activity within patient's tolerance;Monitored during session  Home Living                      Prior Function            PT Goals (current goals can now be found in the care plan section) Acute Rehab PT Goals Patient Stated Goal: To return home. Progress towards PT goals: Progressing toward goals    Frequency    Min 3X/week      PT Plan Current plan remains appropriate    Co-evaluation              AM-PAC PT "6 Clicks" Mobility   Outcome Measure  Help needed turning from your back to your side while in a flat bed without using bedrails?: A Little Help needed moving from lying on your back  to sitting on the side of a flat bed without using bedrails?: A Little Help needed moving to and from a bed to a chair (including a wheelchair)?: A Little Help needed standing up from a chair using your arms (e.g., wheelchair or bedside chair)?: A Little Help needed to walk in hospital room?: A Little Help needed climbing 3-5 steps with a railing? : A Lot 6 Click Score: 17    End of Session Equipment Utilized During Treatment: Oxygen Activity Tolerance: Treatment limited secondary to medical complications (Comment) Patient left: in chair;with call bell/phone within reach;with chair alarm set Nurse Communication: Mobility status;Other (comment) PT Visit Diagnosis: Muscle weakness (generalized) (M62.81);Difficulty in walking, not elsewhere classified (R26.2)     Time: 3202-3343 PT Time Calculation (min) (ACUTE ONLY): 25 min  Charges:  $Gait Training: 8-22 mins $Therapeutic Activity: 8-22 mins              Ramond Dial 12/07/2020, 9:27 PM  Mee Hives, PT MS Acute Rehab Dept. Number: [place] [facility] [facility] [place]
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
## 2020-12-07 NOTE — Progress Notes (Signed)
ANTICOAGULATION CONSULT NOTE - Follow Up Consult  Pharmacy Consult for Heparin Indication: atrial fibrillation  Allergies  Allergen Reactions   Novocain [Procaine] Other (See Comments)    Unknown reaction as a young child    Patient Measurements: Height: 5' 7"  (170.2 cm) Weight: 83 kg (182 lb 15.7 oz) IBW/kg (Calculated) : 66.1 Heparin Dosing Weight:  81.2 kg  Vital Signs: Temp: 98.2 F (36.8 C) (07/22 0721) Temp Source: Axillary (07/22 0721) BP: 120/62 (07/22 0755) Pulse Rate: 81 (07/22 0755)  Labs: Recent Labs    12/05/20 0010 12/05/20 1003 12/05/20 2015 12/06/20 0411 12/06/20 1303 12/07/20 0243  HGB 9.3*  --   --  9.5*  --  8.8*  HCT 31.6*  --   --  32.2*  --  30.4*  PLT 331  --   --  292  --  324  APTT 48* 46*   < > 65* 66* 86*  HEPARINUNFRC 0.87* 0.81*  --  0.85*  --  0.78*  CREATININE 1.20  --   --   --  1.09 1.00   < > = values in this interval not displayed.     Estimated Creatinine Clearance: 68.9 mL/min (by C-G formula based on SCr of 1 mg/dL).   Assessment: Anticoag: IV heparin while holding PTA Eliquis for afib. LD Eliquis 7/17 at 0700. S/p thoracentesis on 7/19 - IV heparin 0.78 elevated this AM from DOAC interaction , Hgb 8.8 stable. Plts 324. aPTT 86, within goal range. Heparin level and aPTT are starting to correlate.   Goal of Therapy:  Heparin level 0.3-0.7 units/ml aPTT goal 66-102 Monitor platelets by anticoagulation protocol: Yes   Plan:  Continue IV heparin at 1800 units/hr   Monitor daily HL and aPTT  F/u resuming home Eliquis when ok with CCM. Needs f/u CT  Donald Pore, PharmD Pharmacy Resident 12/07/2020, 8:03 AM

## 2020-12-07 NOTE — Telephone Encounter (Signed)
See mychart message from 11/13/20.

## 2020-12-08 ENCOUNTER — Inpatient Hospital Stay (HOSPITAL_COMMUNITY): Payer: Medicare Other

## 2020-12-08 DIAGNOSIS — J9621 Acute and chronic respiratory failure with hypoxia: Secondary | ICD-10-CM | POA: Diagnosis not present

## 2020-12-08 DIAGNOSIS — I480 Paroxysmal atrial fibrillation: Secondary | ICD-10-CM

## 2020-12-08 LAB — CBC
HCT: 33.5 % — ABNORMAL LOW (ref 39.0–52.0)
Hemoglobin: 10 g/dL — ABNORMAL LOW (ref 13.0–17.0)
MCH: 23 pg — ABNORMAL LOW (ref 26.0–34.0)
MCHC: 29.9 g/dL — ABNORMAL LOW (ref 30.0–36.0)
MCV: 77.2 fL — ABNORMAL LOW (ref 80.0–100.0)
Platelets: 357 10*3/uL (ref 150–400)
RBC: 4.34 MIL/uL (ref 4.22–5.81)
RDW: 18.1 % — ABNORMAL HIGH (ref 11.5–15.5)
WBC: 10.7 10*3/uL — ABNORMAL HIGH (ref 4.0–10.5)
nRBC: 0 % (ref 0.0–0.2)

## 2020-12-08 LAB — BASIC METABOLIC PANEL
Anion gap: 8 (ref 5–15)
BUN: 38 mg/dL — ABNORMAL HIGH (ref 8–23)
CO2: 32 mmol/L (ref 22–32)
Calcium: 8.4 mg/dL — ABNORMAL LOW (ref 8.9–10.3)
Chloride: 96 mmol/L — ABNORMAL LOW (ref 98–111)
Creatinine, Ser: 0.88 mg/dL (ref 0.61–1.24)
GFR, Estimated: >60 mL/min (ref >60)
Glucose, Bld: 186 mg/dL — ABNORMAL HIGH (ref 70–99)
Potassium: 3.7 mmol/L (ref 3.5–5.1)
Sodium: 136 mmol/L (ref 135–145)

## 2020-12-08 LAB — BLOOD GAS, ARTERIAL
Acid-Base Excess: 6.9 mmol/L — ABNORMAL HIGH (ref 0.0–2.0)
Bicarbonate: 31.4 mmol/L — ABNORMAL HIGH (ref 20.0–28.0)
Drawn by: 535471
FIO2: 68
O2 Saturation: 88.5 %
Patient temperature: 36.6
pCO2 arterial: 48.1 mmHg — ABNORMAL HIGH (ref 32.0–48.0)
pH, Arterial: 7.428 (ref 7.350–7.450)
pO2, Arterial: 56.5 mmHg — ABNORMAL LOW (ref 83.0–108.0)

## 2020-12-08 LAB — GLUCOSE, CAPILLARY
Glucose-Capillary: 206 mg/dL — ABNORMAL HIGH (ref 70–99)
Glucose-Capillary: 244 mg/dL — ABNORMAL HIGH (ref 70–99)
Glucose-Capillary: 277 mg/dL — ABNORMAL HIGH (ref 70–99)
Glucose-Capillary: 408 mg/dL — ABNORMAL HIGH (ref 70–99)

## 2020-12-08 LAB — BODY FLUID CULTURE W GRAM STAIN: Culture: NO GROWTH

## 2020-12-08 LAB — HEPARIN LEVEL (UNFRACTIONATED): Heparin Unfractionated: 0.58 IU/mL (ref 0.30–0.70)

## 2020-12-08 LAB — APTT: aPTT: 65 seconds — ABNORMAL HIGH (ref 24–36)

## 2020-12-08 MED ORDER — FUROSEMIDE 10 MG/ML IJ SOLN
60.0000 mg | Freq: Three times a day (TID) | INTRAMUSCULAR | Status: DC
  Administered 2020-12-08 – 2020-12-11 (×9): 60 mg via INTRAVENOUS
  Filled 2020-12-08 (×10): qty 6

## 2020-12-08 MED ORDER — HEPARIN (PORCINE) 25000 UT/250ML-% IV SOLN
1650.0000 [IU]/h | INTRAVENOUS | Status: DC
  Administered 2020-12-08 – 2020-12-09 (×2): 1800 [IU]/h via INTRAVENOUS
  Filled 2020-12-08 (×3): qty 250

## 2020-12-08 MED ORDER — APIXABAN 5 MG PO TABS: 5.0000 mg | ORAL_TABLET | Freq: Two times a day (BID) | ORAL | Status: DC

## 2020-12-08 NOTE — Progress Notes (Signed)
PROGRESS NOTE    Gary [name]  WPY:[id number] DOB: 09-11-1950 DOA: 12/02/2020 PCP: Jalene Mullet, PA-C   Brief Narrative: Gary [name] is a 70 y.o. male with history of diabetes, hypertension, hyperlipidemia, congestive heart failure, COPD on home oxygen and trilogy machine for chronic respiratory failure with hypoxia and hypercapnia.  Patient presented secondary to shortness of breath with evidence of acute on chronic respiratory failure.  Patient required high flow nasal cannula.  Hospitalization complicated by acute on chronic right heart failure, atrial fibrillation with RVR, worsening of underlying lung disease.  Patient is currently on diuresis and has received a right thoracentesis to help with a large right pleural effusion.  Patient also noted to have a concerning right middle lobe mass.  Attempting to wean patient's oxygen use.    Assessment & Plan:   Principal Problem:   Acute on chronic respiratory failure with hypoxia (HCC) Active Problems:   Hyperlipidemia due to type 2 diabetes mellitus (HCC)   Atrial fibrillation (HCC)   Acute on chronic right heart failure (HCC)   COPD with hypoxia (HCC)   Lung mass   S/P thoracentesis   Acute on chronic respiratory failure with hypoxia and hypercapnia COPD CT chest significant for right pleural effusion, right middle lobe mass and right airspace consolidation. Thoracentesis performed on 7/19 significant for likely exudative fluid; culture with no growth to date.  Patient reports feeling a little better since the thoracentesis and is improving with Lasix diuresis. Patient was empirically treated with cefepime IV which was discontinued after negative procalcitonin. I was able to wean him down to 6 lpm at bedside on 7/21 but he has increased back to 12 lpm today. Chest x-ray appears improved from previous -Wean oxygen to home 4-5 Lpm -Pulmonology recommendations: Continue bronchodilators -Continue Incruse Ellipta, Breo  Ellipta -ABG; will possibly restart Lasix IV  Right lung mass Associated mediastinal adenopathy.  Cytology is pending from pleural effusion.  Unable to biopsy secondary to severe hypoxia. Cytology without evidence of malignant cells -Pulmonology recommendations: considering outpatient biopsy  Atrial fibrillation with RVR Patient is on Eliquis, amiodarone and Cardizem as an outpatient. Started on Cardizem IV and now transitioned to PO. Eliquis on hold secondary to procedures and patient started on heparin drip. Cardiology consulted for management -Cardiology recommendations: Resume Eliquis -Will resume Eliquis after pulmonology recommendations since patient was planned to have outpatient biopsy; plans may need to be changed  Acute on chronic right-sided heart failure Patient is on Demadex as an outpatient. Treated with IV lasix and transitioned back to home torsemide. -Continue home torsemide for now, but may need to escalate back to IV lasix  Pulmonary artery hypertension OSA/OHS -Continue Revatio -Continue trilogy nightly  Hypotension Chronic. Patient is on midodrine as an outpatient. Complicated by above problems. -Continue midodrine  Hyperlipidemia -Continue Lipitor  Microcytic anemia Patient is on iron supplementation as an outpatient. Stable.  Hypokalemia Hypomagnesemia Replete as needed.  Diabetes mellitus, type 2 Hemoglobin A1C of 5.2%. Patient is on Jardiance, metformin as an outpatient. Started on SSI inpatient. Uncontrolled secondary to steroids. -Continue SSI -Continue Novolog 4 units with meals  DVT prophylaxis: Heparin IV Code Status:   Code Status: Full Code Family Communication: None at bedside Disposition Plan: Discharge home (HHPT) pending ability to wean oxygen as his requirement has significantly increased   Consultants:  Pulmonology Cardiology  Procedures:  TRANSTHORACIC ECHOCARDIOGRAM (12/04/2020) IMPRESSIONS     1. Left ventricular ejection  fraction, by estimation, is 55 to 60%. The  left ventricle has normal function. The left ventricle has no regional  wall motion abnormalities. There is moderate concentric left ventricular  hypertrophy. Left ventricular  diastolic function could not be evaluated. There is the interventricular  septum is flattened in systole and diastole, consistent with right  ventricular pressure and volume overload.   2. Right ventricular systolic function is severely reduced. The right  ventricular size is moderately enlarged. There is moderately elevated  pulmonary artery systolic pressure. The estimated right ventricular  systolic pressure is 27.0 mmHg.   3. Left atrial size was moderately dilated.   4. Right atrial size was moderately dilated.   5. The mitral valve is normal in structure. Trivial mitral valve  regurgitation.   6. The aortic valve is tricuspid. There is mild calcification of the  aortic valve. Aortic valve regurgitation is not visualized. Mild aortic  valve sclerosis is present, with no evidence of aortic valve stenosis.   7. The inferior vena cava is dilated in size with <50% respiratory  variability, suggesting right atrial pressure of 15 mmHg.   Antimicrobials: Cefepime IV   Subjective: No dyspnea. Frustrated about hospital course. Concerned he will have another 40 day admission.  Objective: Vitals:   12/07/20 2032 12/08/20 0452 12/08/20 0732 12/08/20 0736  BP: (!) 117/47  115/67   Pulse: (!) 42  81   Resp: 20  (!) 22   Temp: (!) 97.2 F (36.2 C)  97.8 F (36.6 C)   TempSrc: Axillary  Oral   SpO2: 91%  95% 100%  Weight:  82.1 kg    Height:        Intake/Output Summary (Last 24 hours) at 12/08/2020 1059 Last data filed at 12/08/2020 0451 Gross per 24 hour  Intake 1113.72 ml  Output 1200 ml  Net -86.28 ml    Filed Weights   12/06/20 0358 12/07/20 0358 12/08/20 0452  Weight: 81.2 kg 83 kg 82.1 kg    Examination:  General exam: Appears calm and comfortable  and in no acute distress. Conversant Respiratory: Clear to auscultation except for rales at bases. Respiratory effort normal with no intercostal retractions or use of accessory muscles Cardiovascular: S1 & S2 heard, RRR. No murmurs, rubs, gallops or clicks. BLE 2+ LE edema Gastrointestinal: Abdomen is non-distended, soft and non-tender. Easily reducible and not-tender ventral hernia Normal bowel sounds heard Neurologic: No focal neurological deficits Musculoskeletal: No calf tenderness Skin: No cyanosis. No new rashes Psychiatry: Alert and oriented. Memory intact. Mood & affect appropriate    Data Reviewed: I have personally reviewed following labs and imaging studies  CBC Lab Results  Component Value Date   WBC 10.7 (H) 12/08/2020   RBC 4.34 12/08/2020   HGB 10.0 (L) 12/08/2020   HCT 33.5 (L) 12/08/2020   MCV 77.2 (L) 12/08/2020   MCH 23.0 (L) 12/08/2020   PLT 357 12/08/2020   MCHC 29.9 (L) 12/08/2020   RDW 18.1 (H) 12/08/2020   LYMPHSABS 0.9 12/04/2020   MONOABS 1.0 12/04/2020   EOSABS 0.1 12/04/2020   BASOSABS 0.0 35/00/9381     Last metabolic panel Lab Results  Component Value Date   NA 136 12/08/2020   K 3.7 12/08/2020   CL 96 (L) 12/08/2020   CO2 32 12/08/2020   BUN 38 (H) 12/08/2020   CREATININE 0.88 12/08/2020   GLUCOSE 186 (H) 12/08/2020   GFRNONAA >60 12/08/2020   GFRAA 80 (L) 05/14/2013   CALCIUM 8.4 (L) 12/08/2020   PROT 7.4 12/02/2020   ALBUMIN 3.5 12/02/2020  BILITOT 1.1 12/02/2020   ALKPHOS 66 12/02/2020   AST 34 12/02/2020   ALT 19 12/02/2020   ANIONGAP 8 12/08/2020    CBG (last 3)  Recent Labs    12/07/20 1547 12/07/20 2145 12/08/20 0719  GLUCAP 394* 177* 206*      GFR: Estimated Creatinine Clearance: 77.8 mL/min (by C-G formula based on SCr of 0.88 mg/dL).  Coagulation Profile: No results for input(s): INR, PROTIME in the last 168 hours.  Recent Results (from the past 240 hour(s))  Resp Panel by RT-PCR (Flu A&B, Covid)  Nasopharyngeal Swab     Status: None   Collection Time: 12/02/20 10:18 AM   Specimen: Nasopharyngeal Swab; Nasopharyngeal(NP) swabs in vial transport medium  Result Value Ref Range Status   SARS Coronavirus 2 by RT PCR NEGATIVE NEGATIVE Final    Comment: (NOTE) SARS-CoV-2 target nucleic acids are NOT DETECTED.  The SARS-CoV-2 RNA is generally detectable in upper respiratory specimens during the acute phase of infection. The lowest concentration of SARS-CoV-2 viral copies this assay can detect is 138 copies/mL. A negative result does not preclude SARS-Cov-2 infection and should not be used as the sole basis for treatment or other patient management decisions. A negative result may occur with  improper specimen collection/handling, submission of specimen other than nasopharyngeal swab, presence of viral mutation(s) within the areas targeted by this assay, and inadequate number of viral copies(<138 copies/mL). A negative result must be combined with clinical observations, patient history, and epidemiological information. The expected result is Negative.  Fact Sheet for Patients:  EntrepreneurPulse.com.au  Fact Sheet for Healthcare Providers:  IncredibleEmployment.be  This test is no t yet approved or cleared by the [place] FDA and  has been authorized for detection and/or diagnosis of SARS-CoV-2 by FDA under an Emergency Use Authorization (EUA). This EUA will remain  in effect (meaning this test can be used) for the duration of the COVID-19 declaration under Section 564(b)(1) of the Act, 21 U.S.C.section 360bbb-3(b)(1), unless the authorization is terminated  or revoked sooner.       Influenza A by PCR NEGATIVE NEGATIVE Final   Influenza B by PCR NEGATIVE NEGATIVE Final    Comment: (NOTE) The Xpert Xpress SARS-CoV-2/FLU/RSV plus assay is intended as an aid in the diagnosis of influenza from Nasopharyngeal swab specimens and should not be  used as a sole basis for treatment. Nasal washings and aspirates are unacceptable for Xpert Xpress SARS-CoV-2/FLU/RSV testing.  Fact Sheet for Patients: EntrepreneurPulse.com.au  Fact Sheet for Healthcare Providers: IncredibleEmployment.be  This test is not yet approved or cleared by the [place] FDA and has been authorized for detection and/or diagnosis of SARS-CoV-2 by FDA under an Emergency Use Authorization (EUA). This EUA will remain in effect (meaning this test can be used) for the duration of the COVID-19 declaration under Section 564(b)(1) of the Act, 21 U.S.C. section 360bbb-3(b)(1), unless the authorization is terminated or revoked.  Performed at [facility], [place] [address] [place], [place] [address]   Body fluid culture w Gram Stain     Status: None   Collection Time: 12/04/20  2:58 PM   Specimen: Body Fluid  Result Value Ref Range Status   Specimen Description FLUID RIGHT PLEURAL  Final   Special Requests NONE  Final   Gram Stain   Final    FEW WBC PRESENT, PREDOMINANTLY MONONUCLEAR NO ORGANISMS SEEN    Culture   Final    NO GROWTH 3 DAYS Performed at [facility],  [address] [place], [place] [address]    Report Status 12/08/2020 FINAL  Final         Radiology Studies: DG CHEST PORT 1 VIEW  Result Date: 12/07/2020 CLINICAL DATA:  70 year old male with hypoxia. EXAM: PORTABLE CHEST 1 VIEW COMPARISON:  Chest radiograph dated 12/04/2020. FINDINGS: There is cardiomegaly with central vascular congestion, improved since the prior radiograph. Small right pleural effusion and right lung base atelectasis or infiltrate. No pneumothorax. No acute osseous pathology. IMPRESSION: 1. Cardiomegaly with improving vascular congestion. 2. Small right pleural effusion and right lung base atelectasis or infiltrate. Electronically Signed   By: Anner Crete M.D.   On: 12/07/2020 20:10        Scheduled Meds:   amiodarone  200 mg Oral Daily   apixaban  5 mg Oral BID   aspirin EC  81 mg Oral q morning   atorvastatin  40 mg Oral QPM   busPIRone  5 mg Oral BID   diltiazem  30 mg Oral Q6H   docusate sodium  100 mg Oral BID   feeding supplement  237 mL Oral TID BM   ferrous sulfate  325 mg Oral Q breakfast   fluticasone furoate-vilanterol  1 puff Inhalation Daily   insulin aspart  0-15 Units Subcutaneous TID WC   insulin aspart  0-5 Units Subcutaneous QHS   insulin aspart  4 Units Subcutaneous TID WC   midodrine  10 mg Oral TID WC   pantoprazole  40 mg Oral Daily   polyethylene glycol  17 g Oral Daily   predniSONE  40 mg Oral Q breakfast   sildenafil  40 mg Oral TID   sodium chloride flush  3 mL Intravenous Q12H   torsemide  20 mg Oral Daily   umeclidinium bromide  1 puff Inhalation Daily   Continuous Infusions:     LOS: 6 days     Cordelia Poche, MD [facility] 12/08/2020, 10:59 AM  If 7PM-7AM, please contact night-coverage [email or web address]

## 2020-12-08 NOTE — Progress Notes (Signed)
ANTICOAGULATION CONSULT NOTE - Follow Up Consult  Pharmacy Consult for IV Heparin Indication: atrial fibrillation  Allergies  Allergen Reactions   Novocain [Procaine] Other (See Comments)    Unknown reaction as a young child    Patient Measurements: Height: 5' 7"  (170.2 cm) Weight: 82.1 kg (181 lb) IBW/kg (Calculated) : 66.1 Heparin Dosing Weight: 81.2 kg  Vital Signs: Temp: 97.8 F (36.6 C) (07/23 0732) Temp Source: Oral (07/23 0732) BP: 115/67 (07/23 0732) Pulse Rate: 81 (07/23 0732)  Labs: Recent Labs    12/06/20 0411 12/06/20 1303 12/07/20 0243 12/08/20 0242  HGB 9.5*  --  8.8* 10.0*  HCT 32.2*  --  30.4* 33.5*  PLT 292  --  324 357  APTT 65* 66* 86* 65*  HEPARINUNFRC 0.85*  --  0.78* 0.58  CREATININE  --  1.09 1.00 0.88    Estimated Creatinine Clearance: 77.8 mL/min (by C-G formula based on SCr of 0.88 mg/dL).   Assessment: 70 YO male who presented with shortness of breath and pleural effusion with a history of atrial fibrillation on apixaban 5 mg BID with last dose on 7/17 at 0700. Patient underwent thoracentesis on 7/19 and had to be transitioned to IV heparin and held home apixaban. Hgb and platelets are stable. Heparin level was therapeutic at 0.58 on 7/23 at 0242. No signs and symptoms of bleeding.  Goal of Therapy:  Heparin level 0.3-0.7 units/ml aPTT 66-102 seconds Monitor platelets by anticoagulation protocol: Yes   Plan:  Continue IV heparin at 1800 units/hr Monitor daily HL and aPTT Continue to monitor H&H and platelets F/u on resuming home Eliquis.   Varney Daily, PharmD PGY1 Pharmacy Resident  Please check AMION for all [facility] pharmacy phone numbers After 10:00 PM call main pharmacy [phone]

## 2020-12-08 NOTE — Progress Notes (Signed)
Progress Note  Patient Name: Gary [name] Date of Encounter: 12/08/2020  Primary Cardiologist: Elouise Munroe, MD  Subjective   Sitting in bedside chair.  Continues to feel better in terms of breathing status, no sense of chest pain or palpitations.  Inpatient Medications    Scheduled Meds:  amiodarone  200 mg Oral Daily   aspirin EC  81 mg Oral q morning   atorvastatin  40 mg Oral QPM   busPIRone  5 mg Oral BID   diltiazem  30 mg Oral Q6H   docusate sodium  100 mg Oral BID   feeding supplement  237 mL Oral TID BM   ferrous sulfate  325 mg Oral Q breakfast   fluticasone furoate-vilanterol  1 puff Inhalation Daily   insulin aspart  0-15 Units Subcutaneous TID WC   insulin aspart  0-5 Units Subcutaneous QHS   insulin aspart  4 Units Subcutaneous TID WC   midodrine  10 mg Oral TID WC   pantoprazole  40 mg Oral Daily   polyethylene glycol  17 g Oral Daily   predniSONE  40 mg Oral Q breakfast   sildenafil  40 mg Oral TID   sodium chloride flush  3 mL Intravenous Q12H   torsemide  20 mg Oral Daily   umeclidinium bromide  1 puff Inhalation Daily   Continuous Infusions:  heparin 1,800 Units/hr (12/07/20 1053)   PRN Meds: acetaminophen **OR** acetaminophen, ALPRAZolam, bisacodyl, morphine injection, ondansetron **OR** ondansetron (ZOFRAN) IV, oxyCODONE, polyethylene glycol, sodium chloride   Vital Signs    Vitals:   12/07/20 2032 12/08/20 0452 12/08/20 0732 12/08/20 0736  BP: (!) 117/47  115/67   Pulse: (!) 42  81   Resp: 20  (!) 22   Temp: (!) 97.2 F (36.2 C)  97.8 F (36.6 C)   TempSrc: Axillary  Oral   SpO2: 91%  95% 100%  Weight:  82.1 kg    Height:        Intake/Output Summary (Last 24 hours) at 12/08/2020 1002 Last data filed at 12/08/2020 0451 Gross per 24 hour  Intake 1113.72 ml  Output 1900 ml  Net -786.28 ml   Filed Weights   12/06/20 0358 12/07/20 0358 12/08/20 0452  Weight: 81.2 kg 83 kg 82.1 kg    Telemetry    Sinus rhythm.   Personally reviewed.  ECG    No ECG reviewed.  Physical Exam   GEN: No acute distress.   Neck: No JVD. Cardiac: RRR, no murmur, rub, or gallop.  Respiratory: Nonlabored.  Decreased breath sounds, no wheezing. GI: Soft, nontender, bowel sounds present. MS: No edema; No deformity. Neuro:  Nonfocal. Psych: Alert and oriented x 3. Normal affect.  Labs    Chemistry Recent Labs  Lab 12/02/20 1018 12/03/20 0451 12/06/20 1303 12/07/20 0243 12/08/20 0242  NA 137   < > 131* 131* 136  K 4.1   < > 3.9 4.2 3.7  CL 98   < > 94* 93* 96*  CO2 22   < > 28 29 32  GLUCOSE 240*   < > 374* 257* 186*  BUN 25*   < > 31* 37* 38*  CREATININE 1.25*   < > 1.09 1.00 0.88  CALCIUM 8.8*   < > 8.2* 8.4* 8.4*  PROT 7.4  --   --   --   --   ALBUMIN 3.5  --   --   --   --   AST 34  --   --   --   --  ALT 19  --   --   --   --   ALKPHOS 66  --   --   --   --   BILITOT 1.1  --   --   --   --   GFRNONAA >60   < > >60 >60 >60  ANIONGAP 17*   < > 9 9 8    < > = values in this interval not displayed.     Hematology Recent Labs  Lab 12/06/20 0411 12/07/20 0243 12/08/20 0242  WBC 8.2 8.4 10.7*  RBC 4.13* 3.90* 4.34  HGB 9.5* 8.8* 10.0*  HCT 32.2* 30.4* 33.5*  MCV 78.0* 77.9* 77.2*  MCH 23.0* 22.6* 23.0*  MCHC 29.5* 28.9* 29.9*  RDW 18.4* 18.1* 18.1*  PLT 292 324 357    Cardiac Enzymes Recent Labs  Lab 12/02/20 1018 12/02/20 1232  TROPONINIHS 27* 27*    BNP Recent Labs  Lab 12/02/20 1018 12/06/20 0411  BNP 574.4* 492.4*      Radiology    DG CHEST PORT 1 VIEW  Result Date: 12/07/2020 CLINICAL DATA:  70 year old male with hypoxia. EXAM: PORTABLE CHEST 1 VIEW COMPARISON:  Chest radiograph dated 12/04/2020. FINDINGS: There is cardiomegaly with central vascular congestion, improved since the prior radiograph. Small right pleural effusion and right lung base atelectasis or infiltrate. No pneumothorax. No acute osseous pathology. IMPRESSION: 1. Cardiomegaly with improving vascular  congestion. 2. Small right pleural effusion and right lung base atelectasis or infiltrate. Electronically Signed   By: Anner Crete M.D.   On: 12/07/2020 20:10    Cardiac Studies   Echocardiogram 12/04/2020:  1. Left ventricular ejection fraction, by estimation, is 55 to 60%. The  left ventricle has normal function. The left ventricle has no regional  wall motion abnormalities. There is moderate concentric left ventricular  hypertrophy. Left ventricular  diastolic function could not be evaluated. There is the interventricular  septum is flattened in systole and diastole, consistent with right  ventricular pressure and volume overload.   2. Right ventricular systolic function is severely reduced. The right  ventricular size is moderately enlarged. There is moderately elevated  pulmonary artery systolic pressure. The estimated right ventricular  systolic pressure is 12.1 mmHg.   3. Left atrial size was moderately dilated.   4. Right atrial size was moderately dilated.   5. The mitral valve is normal in structure. Trivial mitral valve  regurgitation.   6. The aortic valve is tricuspid. There is mild calcification of the  aortic valve. Aortic valve regurgitation is not visualized. Mild aortic  valve sclerosis is present, with no evidence of aortic valve stenosis.   7. The inferior vena cava is dilated in size with <50% respiratory  variability, suggesting right atrial pressure of 15 mmHg.   Patient Profile     70 y.o. male with a history of CAD, paroxysmal atrial fibrillation, right heart failure, PAH with cor pulmonale, COPD, HTN, HLD, DM type 2, and GI bleed.  Assessment & Plan   1.  Paroxysmal atrial fibrillation with CHA2DS2-VASc score of 4-5.  He is maintaining sinus rhythm, currently on oral amiodarone.  Previously on Eliquis which has been held with transition to IV heparin.  2.  History of GI bleed, currently anemic although hemoglobin up to 10.0 today.  3.  Pulmonary  hypertension in the setting of OSA/OHS/hypoxic lung disease and associated with cor pulmonale.  He continues on oral diuretic at this point and remains on sildenafil as well.  4.  CAD  with chronic distal RCA occlusion and otherwise nonobstructive disease managed medically.  Continue amiodarone, Cardizem, Lipitor, sildenafil, and Demadex.  Consider transitioning from heparin back to Eliquis if no further procedures are anticipated as an inpatient.  As discussed in the prior note, could consider referral for Watchman evaluation given history of GI bleed on anticoagulation.  Signed, Rozann Lesches, MD  12/08/2020, 10:02 AM
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
## 2020-12-08 NOTE — Procedures (Signed)
Thoracentesis  Procedure Note  Finch Costanzo  [id number]  May 15, 1951  Date:12/08/20  Time:6:35 PM   Provider Performing:Keiri Solano C Tamala Julian   Procedure: Thoracentesis with imaging guidance (60737)  Indication(s) Pleural Effusion  Consent Risks of the procedure as well as the alternatives and risks of each were explained to the patient and/or caregiver.  Consent for the procedure was obtained and is signed in the bedside chart  Anesthesia Topical only with 1% lidocaine    Time Out Verified patient identification, verified procedure, site/side was marked, verified correct patient position, special equipment/implants available, medications/allergies/relevant history reviewed, required imaging and test results available.   Sterile Technique Maximal sterile technique including full sterile barrier drape, hand hygiene, sterile gown, sterile gloves, mask, hair covering, sterile ultrasound probe cover (if used).  Procedure Description Ultrasound was used to identify appropriate pleural anatomy for placement and overlying skin marked.  Area of drainage cleaned and draped in sterile fashion. Lidocaine was used to anesthetize the skin and subcutaneous tissue.  1300 cc's of amber appearing fluid was drained from the right pleural space. Catheter then removed and bandaid applied to site.   Complications/Tolerance None; patient tolerated the procedure well. Chest X-ray is ordered to confirm no post-procedural complication.   EBL Minimal   Specimen(s) Pleural fluid

## 2020-12-09 DIAGNOSIS — J9621 Acute and chronic respiratory failure with hypoxia: Secondary | ICD-10-CM | POA: Diagnosis not present

## 2020-12-09 LAB — BASIC METABOLIC PANEL
Anion gap: 11 (ref 5–15)
BUN: 35 mg/dL — ABNORMAL HIGH (ref 8–23)
CO2: 32 mmol/L (ref 22–32)
Calcium: 8.2 mg/dL — ABNORMAL LOW (ref 8.9–10.3)
Chloride: 91 mmol/L — ABNORMAL LOW (ref 98–111)
Creatinine, Ser: 0.91 mg/dL (ref 0.61–1.24)
GFR, Estimated: >60 mL/min (ref >60)
Glucose, Bld: 305 mg/dL — ABNORMAL HIGH (ref 70–99)
Potassium: 3.5 mmol/L (ref 3.5–5.1)
Sodium: 134 mmol/L — ABNORMAL LOW (ref 135–145)

## 2020-12-09 LAB — CBC
HCT: 32.7 % — ABNORMAL LOW (ref 39.0–52.0)
Hemoglobin: 9.5 g/dL — ABNORMAL LOW (ref 13.0–17.0)
MCH: 22.4 pg — ABNORMAL LOW (ref 26.0–34.0)
MCHC: 29.1 g/dL — ABNORMAL LOW (ref 30.0–36.0)
MCV: 77.1 fL — ABNORMAL LOW (ref 80.0–100.0)
Platelets: 359 10*3/uL (ref 150–400)
RBC: 4.24 MIL/uL (ref 4.22–5.81)
RDW: 18.1 % — ABNORMAL HIGH (ref 11.5–15.5)
WBC: 8.3 10*3/uL (ref 4.0–10.5)
nRBC: 0 % (ref 0.0–0.2)

## 2020-12-09 LAB — GLUCOSE, CAPILLARY
Glucose-Capillary: 309 mg/dL — ABNORMAL HIGH (ref 70–99)
Glucose-Capillary: 224 mg/dL — ABNORMAL HIGH (ref 70–99)
Glucose-Capillary: 304 mg/dL — ABNORMAL HIGH (ref 70–99)
Glucose-Capillary: 326 mg/dL — ABNORMAL HIGH (ref 70–99)
Glucose-Capillary: 281 mg/dL — ABNORMAL HIGH (ref 70–99)

## 2020-12-09 LAB — HEPARIN LEVEL (UNFRACTIONATED): Heparin Unfractionated: 0.56 IU/mL (ref 0.30–0.70)

## 2020-12-09 LAB — MAGNESIUM: Magnesium: 1.7 mg/dL (ref 1.7–2.4)

## 2020-12-09 MED ORDER — INSULIN ASPART 100 UNIT/ML IJ SOLN
6.0000 [IU] | Freq: Three times a day (TID) | INTRAMUSCULAR | Status: DC
  Administered 2020-12-09 – 2020-12-11 (×8): 6 [IU] via SUBCUTANEOUS

## 2020-12-09 MED ORDER — INSULIN GLARGINE 100 UNIT/ML ~~LOC~~ SOLN
20.0000 [IU] | Freq: Every day | SUBCUTANEOUS | Status: DC
  Administered 2020-12-09 – 2020-12-11 (×3): 20 [IU] via SUBCUTANEOUS
  Filled 2020-12-09 (×3): qty 0.2

## 2020-12-09 MED ORDER — INSULIN ASPART 100 UNIT/ML IJ SOLN: 5.0000 [IU] | Freq: Three times a day (TID) | INTRAMUSCULAR | Status: DC

## 2020-12-09 MED ORDER — MAGNESIUM SULFATE 4 GM/100ML IV SOLN
4.0000 g | Freq: Once | INTRAVENOUS | Status: AC
  Administered 2020-12-09: 4 g via INTRAVENOUS
  Filled 2020-12-09: qty 100

## 2020-12-09 MED ORDER — POTASSIUM CHLORIDE CRYS ER 20 MEQ PO TBCR
40.0000 meq | EXTENDED_RELEASE_TABLET | Freq: Two times a day (BID) | ORAL | Status: AC
  Administered 2020-12-09 (×2): 40 meq via ORAL
  Filled 2020-12-09 (×2): qty 2

## 2020-12-09 NOTE — Progress Notes (Signed)
PROGRESS NOTE    Gary [name]  CZY:[id number] DOB: [birth year] DOA: 12/02/2020 PCP: Jalene Mullet, PA-C   Brief Narrative: Gary [name] is a 70 y.o. male with history of diabetes, hypertension, hyperlipidemia, congestive heart failure, COPD on home oxygen and trilogy machine for chronic respiratory failure with hypoxia and hypercapnia.  Patient presented secondary to shortness of breath with evidence of acute on chronic respiratory failure.  Patient required high flow nasal cannula.  Hospitalization complicated by acute on chronic right heart failure, atrial fibrillation with RVR, worsening of underlying lung disease.  Patient is currently on diuresis and has received a right thoracentesis to help with a large right pleural effusion.  Patient also noted to have a concerning right middle lobe mass.  Attempting to wean patient's oxygen use.    Assessment & Plan:   Principal Problem:   Acute on chronic respiratory failure with hypoxia (HCC) Active Problems:   Hyperlipidemia due to type 2 diabetes mellitus (HCC)   Atrial fibrillation (HCC)   Acute on chronic right heart failure (HCC)   COPD with hypoxia (HCC)   Lung mass   S/P thoracentesis   Acute on chronic respiratory failure with hypoxia and hypercapnia COPD CT chest significant for right pleural effusion, right middle lobe mass and right airspace consolidation. Thoracentesis performed on 7/19 significant for likely exudative fluid; culture with no growth to date.  Patient reports feeling a little better since the thoracentesis and is improving with Lasix diuresis. Patient was empirically treated with cefepime IV which was discontinued after negative procalcitonin. I was able to wean him down to 6 lpm at bedside on 7/21 but increased back to 12 lpm. Pulmonology re-consulted and performed bedside thoracentesis yielding ~1300 mL of fluid; cytology sent. Oxygen use decreasing again. -Wean oxygen to home 4-5 Lpm -Pulmonology  recommendations: pending today -Continue Incruse Ellipta, Breo Ellipta -Continue Lasix IV  Right lung mass Associated mediastinal adenopathy.  Unable to biopsy secondary to severe hypoxia. Cytology (7/19) without evidence of malignant cells -Pulmonology recommendations: considering outpatient biopsy -Cytology (7/23) pending  Recurrent pleural effusion Right sided. Concern this is malignant. Patient has required two large volume thoracenteses so far. May need to consider Pleurx catheter if reaccumulation is too quick.  Atrial fibrillation with RVR Patient is on Eliquis, amiodarone and Cardizem as an outpatient. Started on Cardizem IV and now transitioned to PO. Eliquis on hold secondary to procedures and patient started on heparin drip. Cardiology consulted for management -Cardiology recommendations: Resume Eliquis -Continue Heparin IV for now; resume Eliquis once there is a clearer picture for discharge  Acute on chronic right-sided heart failure Patient is on Demadex as an outpatient. Treated with IV lasix and transitioned back to home torsemide. -Continue IV lasix as mentioned above  Pulmonary artery hypertension OSA/OHS -Continue Revatio -Continue trilogy nightly  Hypotension Chronic. Patient is on midodrine as an outpatient. Complicated by above problems. -Continue midodrine  Hyperlipidemia -Continue Lipitor  Microcytic anemia Patient is on iron supplementation as an outpatient. Stable.  Hypokalemia Hypomagnesemia Replete as needed.  Diabetes mellitus, type 2 Hemoglobin A1C of 5.2%. Patient is on Jardiance, metformin as an outpatient. Started on SSI inpatient. Uncontrolled secondary to steroids. -Continue SSI -Continue Novolog 4 units with meals  DVT prophylaxis: Heparin IV Code Status:   Code Status: Full Code Family Communication: None at bedside Disposition Plan: Discharge home (HHPT) pending ability to wean oxygen as his requirement has significantly increased,  pending pulmonology recommendations   Consultants:  Pulmonology Cardiology  Procedures:  TRANSTHORACIC ECHOCARDIOGRAM (12/04/2020) IMPRESSIONS     1. Left ventricular ejection fraction, by estimation, is 55 to 60%. The  left ventricle has normal function. The left ventricle has no regional  wall motion abnormalities. There is moderate concentric left ventricular  hypertrophy. Left ventricular  diastolic function could not be evaluated. There is the interventricular  septum is flattened in systole and diastole, consistent with right  ventricular pressure and volume overload.   2. Right ventricular systolic function is severely reduced. The right  ventricular size is moderately enlarged. There is moderately elevated  pulmonary artery systolic pressure. The estimated right ventricular  systolic pressure is 45.6 mmHg.   3. Left atrial size was moderately dilated.   4. Right atrial size was moderately dilated.   5. The mitral valve is normal in structure. Trivial mitral valve  regurgitation.   6. The aortic valve is tricuspid. There is mild calcification of the  aortic valve. Aortic valve regurgitation is not visualized. Mild aortic  valve sclerosis is present, with no evidence of aortic valve stenosis.   7. The inferior vena cava is dilated in size with <50% respiratory  variability, suggesting right atrial pressure of 15 mmHg.   Antimicrobials: Cefepime IV   Subjective: No dyspnea today. No chest pain.  Objective: Vitals:   12/09/20 0337 12/09/20 0415 12/09/20 0749 12/09/20 0820  BP:  116/66 95/69   Pulse:  77 80   Resp:  20 20   Temp:  98.4 F (36.9 C) 97.7 F (36.5 C)   TempSrc:  Axillary Oral   SpO2:  97% 97% 97%  Weight: 78.8 kg     Height:        Intake/Output Summary (Last 24 hours) at 12/09/2020 1143 Last data filed at 12/09/2020 1006 Gross per 24 hour  Intake 254.34 ml  Output 6100 ml  Net -5845.66 ml    Filed Weights   12/07/20 0358 12/08/20 0452  12/09/20 0337  Weight: 83 kg 82.1 kg 78.8 kg    Examination:  General exam: Appears calm and comfortable Respiratory system: Bibasilar rales. Respiratory effort normal. Cardiovascular system: S1 & S2 heard, RRR. No murmurs, rubs, gallops or clicks. Gastrointestinal system: Abdomen is nondistended, soft and nontender. No organomegaly or masses felt. Normal bowel sounds heard. Central nervous system: Alert and oriented. No focal neurological deficits. Musculoskeletal: BLE edema. No calf tenderness Skin: No cyanosis. No rashes Psychiatry: Judgement and insight appear normal. Mood & affect appropriate.     Data Reviewed: I have personally reviewed following labs and imaging studies  CBC Lab Results  Component Value Date   WBC 8.3 12/09/2020   RBC 4.24 12/09/2020   HGB 9.5 (L) 12/09/2020   HCT 32.7 (L) 12/09/2020   MCV 77.1 (L) 12/09/2020   MCH 22.4 (L) 12/09/2020   PLT 359 12/09/2020   MCHC 29.1 (L) 12/09/2020   RDW 18.1 (H) 12/09/2020   LYMPHSABS 0.9 12/04/2020   MONOABS 1.0 12/04/2020   EOSABS 0.1 12/04/2020   BASOSABS 0.0 25/63/8937     Last metabolic panel Lab Results  Component Value Date   NA 134 (L) 12/09/2020   K 3.5 12/09/2020   CL 91 (L) 12/09/2020   CO2 32 12/09/2020   BUN 35 (H) 12/09/2020   CREATININE 0.91 12/09/2020   GLUCOSE 305 (H) 12/09/2020   GFRNONAA >60 12/09/2020   GFRAA 80 (L) 05/14/2013   CALCIUM 8.2 (L) 12/09/2020   PROT 7.4 12/02/2020   ALBUMIN 3.5 12/02/2020   BILITOT 1.1 12/02/2020  ALKPHOS 66 12/02/2020   AST 34 12/02/2020   ALT 19 12/02/2020   ANIONGAP 11 12/09/2020    CBG (last 3)  Recent Labs    12/08/20 2241 12/09/20 0142 12/09/20 0724  GLUCAP 408* 309* 224*      GFR: Estimated Creatinine Clearance: 68.6 mL/min (by C-G formula based on SCr of 0.91 mg/dL).  Coagulation Profile: No results for input(s): INR, PROTIME in the last 168 hours.  Recent Results (from the past 240 hour(s))  Resp Panel by RT-PCR (Flu A&B,  Covid) Nasopharyngeal Swab     Status: None   Collection Time: 12/02/20 10:18 AM   Specimen: Nasopharyngeal Swab; Nasopharyngeal(NP) swabs in vial transport medium  Result Value Ref Range Status   SARS Coronavirus 2 by RT PCR NEGATIVE NEGATIVE Final    Comment: (NOTE) SARS-CoV-2 target nucleic acids are NOT DETECTED.  The SARS-CoV-2 RNA is generally detectable in upper respiratory specimens during the acute phase of infection. The lowest concentration of SARS-CoV-2 viral copies this assay can detect is 138 copies/mL. A negative result does not preclude SARS-Cov-2 infection and should not be used as the sole basis for treatment or other patient management decisions. A negative result may occur with  improper specimen collection/handling, submission of specimen other than nasopharyngeal swab, presence of viral mutation(s) within the areas targeted by this assay, and inadequate number of viral copies(<138 copies/mL). A negative result must be combined with clinical observations, patient history, and epidemiological information. The expected result is Negative.  Fact Sheet for Patients:  EntrepreneurPulse.com.au  Fact Sheet for Healthcare Providers:  IncredibleEmployment.be  This test is no t yet approved or cleared by the [place] FDA and  has been authorized for detection and/or diagnosis of SARS-CoV-2 by FDA under an Emergency Use Authorization (EUA). This EUA will remain  in effect (meaning this test can be used) for the duration of the COVID-19 declaration under Section 564(b)(1) of the Act, 21 U.S.C.section 360bbb-3(b)(1), unless the authorization is terminated  or revoked sooner.       Influenza A by PCR NEGATIVE NEGATIVE Final   Influenza B by PCR NEGATIVE NEGATIVE Final    Comment: (NOTE) The Xpert Xpress SARS-CoV-2/FLU/RSV plus assay is intended as an aid in the diagnosis of influenza from Nasopharyngeal swab specimens and should  not be used as a sole basis for treatment. Nasal washings and aspirates are unacceptable for Xpert Xpress SARS-CoV-2/FLU/RSV testing.  Fact Sheet for Patients: EntrepreneurPulse.com.au  Fact Sheet for Healthcare Providers: IncredibleEmployment.be  This test is not yet approved or cleared by the [place] FDA and has been authorized for detection and/or diagnosis of SARS-CoV-2 by FDA under an Emergency Use Authorization (EUA). This EUA will remain in effect (meaning this test can be used) for the duration of the COVID-19 declaration under Section 564(b)(1) of the Act, 21 U.S.C. section 360bbb-3(b)(1), unless the authorization is terminated or revoked.  Performed at [facility], [place] [address], [place], [place] [address]   Body fluid culture w Gram Stain     Status: None   Collection Time: 12/04/20  2:58 PM   Specimen: Body Fluid  Result Value Ref Range Status   Specimen Description FLUID RIGHT PLEURAL  Final   Special Requests NONE  Final   Gram Stain   Final    FEW WBC PRESENT, PREDOMINANTLY MONONUCLEAR NO ORGANISMS SEEN    Culture   Final    NO GROWTH 3 DAYS Performed at [facility], [address],  [place] [address]    Report Status 12/08/2020 FINAL  Final         Radiology Studies: DG Chest 1 View  Result Date: 12/08/2020 CLINICAL DATA:  70 year old male status post thoracentesis. EXAM: CHEST  1 VIEW COMPARISON:  Chest radiograph dated 12/07/2020 FINDINGS: Interval decrease in the size of the right pleural effusion. No pneumothorax. There is cardiomegaly with vascular congestion. Pneumonia is not excluded. No acute osseous pathology. IMPRESSION: Interval decrease in the size of the right pleural effusion. No pneumothorax. Electronically Signed   By: Anner Crete M.D.   On: 12/08/2020 19:11   DG CHEST PORT 1 VIEW  Result Date: 12/07/2020 CLINICAL DATA:  70 year old male with hypoxia. EXAM: PORTABLE  CHEST 1 VIEW COMPARISON:  Chest radiograph dated 12/04/2020. FINDINGS: There is cardiomegaly with central vascular congestion, improved since the prior radiograph. Small right pleural effusion and right lung base atelectasis or infiltrate. No pneumothorax. No acute osseous pathology. IMPRESSION: 1. Cardiomegaly with improving vascular congestion. 2. Small right pleural effusion and right lung base atelectasis or infiltrate. Electronically Signed   By: Anner Crete M.D.   On: 12/07/2020 20:10        Scheduled Meds:  amiodarone  200 mg Oral Daily   aspirin EC  81 mg Oral q morning   atorvastatin  40 mg Oral QPM   busPIRone  5 mg Oral BID   diltiazem  30 mg Oral Q6H   docusate sodium  100 mg Oral BID   feeding supplement  237 mL Oral TID BM   ferrous sulfate  325 mg Oral Q breakfast   fluticasone furoate-vilanterol  1 puff Inhalation Daily   furosemide  60 mg Intravenous Q8H   insulin aspart  0-15 Units Subcutaneous TID WC   insulin aspart  0-5 Units Subcutaneous QHS   insulin aspart  6 Units Subcutaneous TID WC   insulin glargine  20 Units Subcutaneous Daily   midodrine  10 mg Oral TID WC   pantoprazole  40 mg Oral Daily   polyethylene glycol  17 g Oral Daily   predniSONE  40 mg Oral Q breakfast   sildenafil  40 mg Oral TID   sodium chloride flush  3 mL Intravenous Q12H   umeclidinium bromide  1 puff Inhalation Daily   Continuous Infusions:  heparin 1,800 Units/hr (12/08/20 1633)      LOS: 7 days     Cordelia Poche, MD [facility] 12/09/2020, 11:43 AM  If 7PM-7AM, please contact night-coverage [email or web address]

## 2020-12-09 NOTE — Progress Notes (Signed)
ANTICOAGULATION CONSULT NOTE - Follow Up Consult  Pharmacy Consult for Heparin Indication: atrial fibrillation  Allergies  Allergen Reactions   Novocain [Procaine] Other (See Comments)    Unknown reaction as a young child    Patient Measurements: Height: 5' 7"  (170.2 cm) Weight: 78.8 kg (173 lb 11.6 oz) IBW/kg (Calculated) : 66.1 Heparin Dosing Weight: 81.2 kg  Vital Signs: Temp: 97.7 F (36.5 C) (07/24 0749) Temp Source: Oral (07/24 0749) BP: 95/69 (07/24 0749) Pulse Rate: 80 (07/24 0749)  Labs: Recent Labs    12/06/20 1303 12/06/20 1303 12/07/20 0243 12/08/20 0242 12/09/20 0130  HGB  --    < > 8.8* 10.0* 9.5*  HCT  --   --  30.4* 33.5* 32.7*  PLT  --   --  324 357 359  APTT 66*  --  86* 65*  --   HEPARINUNFRC  --   --  0.78* 0.58 0.56  CREATININE 1.09  --  1.00 0.88 0.91   < > = values in this interval not displayed.    Estimated Creatinine Clearance: 68.6 mL/min (by C-G formula based on SCr of 0.91 mg/dL).  Assessment: 70 YO male who presented with shortness of breath and pleural effusion with a history of atrial fibrillation on apixaban 5 mg BID with last dose on 7/17 at 0700. Patient underwent thoracentesis on 7/19 and 7/23 so he was transitioned to IV heparin and held home apixaban. Hgb and platelets are stable. Heparin level was therapeutic at 0.56 on 7/24 at 0130. No signs and symptoms of bleeding.  Goal of Therapy:  Heparin level 0.3-0.7 units/ml Monitor platelets by anticoagulation protocol: Yes   Plan:  Continue IV heparin at 1800 units/hr Monitor daily HL, H&H, and platelets F/u on resuming home Eliquis   Varney Daily, PharmD PGY1 Pharmacy Resident  Please check AMION for all [facility] pharmacy phone numbers After 10:00 PM call main pharmacy [phone]
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
## 2020-12-10 ENCOUNTER — Inpatient Hospital Stay (HOSPITAL_COMMUNITY): Payer: Medicare Other

## 2020-12-10 ENCOUNTER — Other Ambulatory Visit (HOSPITAL_COMMUNITY): Payer: Medicare Other

## 2020-12-10 DIAGNOSIS — J9621 Acute and chronic respiratory failure with hypoxia: Secondary | ICD-10-CM | POA: Diagnosis not present

## 2020-12-10 LAB — BASIC METABOLIC PANEL
Anion gap: 10 (ref 5–15)
BUN: 36 mg/dL — ABNORMAL HIGH (ref 8–23)
CO2: 33 mmol/L — ABNORMAL HIGH (ref 22–32)
Calcium: 8.3 mg/dL — ABNORMAL LOW (ref 8.9–10.3)
Chloride: 88 mmol/L — ABNORMAL LOW (ref 98–111)
Creatinine, Ser: 0.84 mg/dL (ref 0.61–1.24)
GFR, Estimated: >60 mL/min (ref >60)
Glucose, Bld: 180 mg/dL — ABNORMAL HIGH (ref 70–99)
Potassium: 4.2 mmol/L (ref 3.5–5.1)
Sodium: 131 mmol/L — ABNORMAL LOW (ref 135–145)

## 2020-12-10 LAB — MAGNESIUM: Magnesium: 2.1 mg/dL (ref 1.7–2.4)

## 2020-12-10 LAB — CYTOLOGY - NON PAP

## 2020-12-10 LAB — HEPARIN LEVEL (UNFRACTIONATED)
Heparin Unfractionated: 0.87 IU/mL — ABNORMAL HIGH (ref 0.30–0.70)
Heparin Unfractionated: 0.7 IU/mL (ref 0.30–0.70)

## 2020-12-10 LAB — CBC
HCT: 35.7 % — ABNORMAL LOW (ref 39.0–52.0)
Hemoglobin: 10.3 g/dL — ABNORMAL LOW (ref 13.0–17.0)
MCH: 22.3 pg — ABNORMAL LOW (ref 26.0–34.0)
MCHC: 28.9 g/dL — ABNORMAL LOW (ref 30.0–36.0)
MCV: 77.4 fL — ABNORMAL LOW (ref 80.0–100.0)
Platelets: 359 10*3/uL (ref 150–400)
RBC: 4.61 MIL/uL (ref 4.22–5.81)
RDW: 18.2 % — ABNORMAL HIGH (ref 11.5–15.5)
WBC: 9.2 10*3/uL (ref 4.0–10.5)
nRBC: 0 % (ref 0.0–0.2)

## 2020-12-10 LAB — GLUCOSE, CAPILLARY
Glucose-Capillary: 178 mg/dL — ABNORMAL HIGH (ref 70–99)
Glucose-Capillary: 242 mg/dL — ABNORMAL HIGH (ref 70–99)
Glucose-Capillary: 308 mg/dL — ABNORMAL HIGH (ref 70–99)
Glucose-Capillary: 316 mg/dL — ABNORMAL HIGH (ref 70–99)

## 2020-12-10 MED ORDER — ENSURE MAX PROTEIN PO LIQD
11.0000 [oz_av] | Freq: Two times a day (BID) | ORAL | Status: DC
  Administered 2020-12-10 – 2020-12-11 (×4): 11 [oz_av] via ORAL
  Filled 2020-12-10 (×4): qty 330

## 2020-12-10 MED ORDER — APIXABAN 5 MG PO TABS
5.0000 mg | ORAL_TABLET | Freq: Two times a day (BID) | ORAL | Status: DC
  Administered 2020-12-10 – 2020-12-11 (×3): 5 mg via ORAL
  Filled 2020-12-10 (×3): qty 1

## 2020-12-10 NOTE — Telephone Encounter (Signed)
DC CT order, still hospitalized, delay for about a week.

## 2020-12-10 NOTE — Progress Notes (Signed)
Nutrition Follow-up  DOCUMENTATION CODES:   Not applicable  INTERVENTION:  D/c ensure enlive  Ensure Max po BID, each supplement provides 150 kcal and 30 grams of protein  NUTRITION DIAGNOSIS:   Increased nutrient needs related to chronic illness (COPD, CHF) as evidenced by estimated needs.  ongoing  GOAL:   Patient will meet greater than or equal to 90% of their needs  progressing  MONITOR:   PO intake, Supplement acceptance, Skin, Labs, I & O's  REASON FOR ASSESSMENT:   Consult Assessment of nutrition requirement/status  ASSESSMENT:   70 year old male with history of diabetes, hypertension, hyperlipidemia, congestive heart failure, COPD on home oxygen/trilogy machine who presented with shortness of breath. CT chest done on this admission showed right pleural effusion, a spiculated mass on the right side, airspace consolidation.  7/19 s/p thoracentesis  (no evidence of malignant cells) 7/23 s/p thoracentesis (cytology pending)  Oxygen needs decreasing again after thoracentesis.  Reports good appetite. PO intake: 75-100% x last 8 meal completions  Medications: Scheduled Meds:  amiodarone  200 mg Oral Daily   apixaban  5 mg Oral BID   aspirin EC  81 mg Oral q morning   atorvastatin  40 mg Oral QPM   busPIRone  5 mg Oral BID   diltiazem  30 mg Oral Q6H   docusate sodium  100 mg Oral BID   ferrous sulfate  325 mg Oral Q breakfast   fluticasone furoate-vilanterol  1 puff Inhalation Daily   furosemide  60 mg Intravenous Q8H   insulin aspart  0-15 Units Subcutaneous TID WC   insulin aspart  0-5 Units Subcutaneous QHS   insulin aspart  6 Units Subcutaneous TID WC   insulin glargine  20 Units Subcutaneous Daily   midodrine  10 mg Oral TID WC   pantoprazole  40 mg Oral Daily   polyethylene glycol  17 g Oral Daily   predniSONE  40 mg Oral Q breakfast   Ensure Max Protein  11 oz Oral BID   sildenafil  40 mg Oral TID   sodium chloride flush  3 mL Intravenous Q12H    umeclidinium bromide  1 puff Inhalation Daily   Continuous Infusions:  Labs: Recent Labs  Lab 12/07/20 0243 12/08/20 0242 12/09/20 0130 12/10/20 0359  NA 131* 136 134* 131*  K 4.2 3.7 3.5 4.2  CL 93* 96* 91* 88*  CO2 29 32 32 33*  BUN 37* 38* 35* 36*  CREATININE 1.00 0.88 0.91 0.84  CALCIUM 8.4* 8.4* 8.2* 8.3*  MG 2.0  --  1.7 2.1  GLUCOSE 257* 186* 305* 180*  CBGs 304-326-281  UOP: 6.6L x24 hours   Diet Order:   Diet Order             Diet heart healthy/carb modified Room service appropriate? Yes with Assist; Fluid consistency: Thin; Fluid restriction: 1500 mL Fluid  Diet effective now                   EDUCATION NEEDS:   Education needs have been addressed  Skin:  Skin Assessment: Reviewed RN Assessment  Last BM:  7/24  Height:   Ht Readings from Last 1 Encounters:  12/02/20 5' 7"  (1.702 m)    Weight:   Wt Readings from Last 1 Encounters:  12/10/20 76.6 kg     BMI:  Body mass index is 26.45 kg/m.  Estimated Nutritional Needs:   Kcal:  2000-2200  Protein:  100-110 grams  Fluid:  >/= 2 L/day  Larkin Ina, MS, RD, LDN (she/her/hers) RD pager number and weekend/on-call pager number located in [place].

## 2020-12-10 NOTE — Progress Notes (Signed)
Inpatient Diabetes Program Recommendations  AACE/ADA: New Consensus Statement on Inpatient Glycemic Control (2015)  Target Ranges:  Prepandial:   less than 140 mg/dL      Peak postprandial:   less than 180 mg/dL (1-2 hours)      Critically ill patients:  140 - 180 mg/dL   Lab Results  Component Value Date   GLUCAP 178 (H) 12/10/2020   HGBA1C 6.7 (H) 09/03/2020    Review of Glycemic Control Results for Gary [name], Gary [name] (MRN [id number]) as of 12/10/2020 10:06  Ref. Range 12/09/2020 11:48 12/09/2020 16:57 12/09/2020 22:21 12/10/2020 07:02  Glucose-Capillary Latest Ref Range: 70 - 99 mg/dL 304 (H) 326 (H) 281 (H) 178 (H)  Diabetes history: DM2 Outpatient Diabetes medications: Metformin 1000 mg BID, Jardiance 10 mg daily Current orders for Inpatient glycemic control: Novolog 0-15 units TID with meals, Novolog 0-5 units QHS; Novolog 6 units TID, Lantus 20 units QD Prednisone 40 mg QAM  Inpatient Diabetes Program Recommendations:    If patient to remain inpatient, consider increasing Lantus to 24 units QD and increasing meal coverage to Novolog 8 units TID (assuming patient is consuming >50% of meals).   Thanks, Bronson Curb, MSN, RNC-OB Diabetes Coordinator [phone] (8a-5p)
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
## 2020-12-10 NOTE — Progress Notes (Signed)
PROGRESS NOTE    Daxten Kovalenko  YEB:[id number] DOB: 11/24/1950 DOA: 12/02/2020 PCP: Jalene Mullet, PA-C   Brief Narrative: Rollin Kotowski is a 70 y.o. male with history of diabetes, hypertension, hyperlipidemia, congestive heart failure, COPD on home oxygen and trilogy machine for chronic respiratory failure with hypoxia and hypercapnia.  Patient presented secondary to shortness of breath with evidence of acute on chronic respiratory failure.  Patient required high flow nasal cannula.  Hospitalization complicated by acute on chronic right heart failure, atrial fibrillation with RVR, worsening of underlying lung disease.  Patient is currently on diuresis and has received a right thoracentesis to help with a large right pleural effusion.  Patient also noted to have a concerning right middle lobe mass.  Attempting to wean patient's oxygen use.    Assessment & Plan:   Principal Problem:   Acute on chronic respiratory failure with hypoxia (HCC) Active Problems:   Hyperlipidemia due to type 2 diabetes mellitus (HCC)   Atrial fibrillation (HCC)   Acute on chronic right heart failure (HCC)   Recurrent pleural effusion on right   COPD with hypoxia (HCC)   Lung mass   S/P thoracentesis   Acute on chronic respiratory failure with hypoxia and hypercapnia COPD CT chest significant for right pleural effusion, right middle lobe mass and right airspace consolidation. Thoracentesis performed on 7/19 significant for likely exudative fluid; culture with no growth to date.  Patient reports feeling a little better since the thoracentesis and is improving with Lasix diuresis. Patient was empirically treated with cefepime IV which was discontinued after negative procalcitonin. I was able to wean him down to 6 lpm at bedside on 7/21 but increased back to 12 lpm. Pulmonology re-consulted and performed bedside thoracentesis yielding ~1300 mL of fluid; cytology sent. Oxygen use decreasing again. -Wean  oxygen to home 4-5 Lpm; or lower if able -Pulmonology recommendations: Continue diuresis -Continue Incruse Ellipta, Breo Ellipta -Continue Lasix IV, pulmonology recommendations to maximize IV Lasix until renal intolerance prior to discharge  Right lung mass Associated mediastinal adenopathy.  Unable to biopsy secondary to severe hypoxia. Cytology (7/19) without evidence of malignant cells -Pulmonology recommendations: considering outpatient biopsy -Cytology (7/23) pending  Recurrent pleural effusion Right sided. Concern this is malignant. Patient has required two large volume thoracenteses so far. May need to consider Pleurx catheter if reaccumulation is too quick.  Atrial fibrillation with RVR Patient is on Eliquis, amiodarone and Cardizem as an outpatient. Started on Cardizem IV and now transitioned to PO. Eliquis on hold secondary to procedures and patient started on heparin drip. Cardiology consulted for management -Discontinue heparin and restart home Eliquis, 7/25  Acute on chronic right-sided heart failure Patient is on Demadex as an outpatient. Treated with IV lasix and transitioned back to home torsemide. -Continue IV lasix as mentioned above  Pulmonary artery hypertension OSA/OHS -Continue Revatio -Continue trilogy nightly  Hypotension Chronic. Patient is on midodrine as an outpatient. Complicated by above problems. -Continue midodrine  Hyperlipidemia -Continue Lipitor  Microcytic anemia Patient is on iron supplementation as an outpatient. Stable.  Hypokalemia Hypomagnesemia Replete as needed.  Diabetes mellitus, type 2 Hemoglobin A1C of 5.2%. Patient is on Jardiance, metformin as an outpatient. Started on SSI inpatient. Uncontrolled secondary to steroids. -Continue SSI -Continue Novolog 6 units with meals -Continue Lantus 20 units daily  DVT prophylaxis: Heparin IV Code Status:   Code Status: Full Code Family Communication: Wife at bedside Disposition Plan:  Discharge home (HHPT) pending ability to wean oxygen as  his requirement has significantly increased, pending pulmonology recommendations   Consultants:  Pulmonology Cardiology  Procedures:  TRANSTHORACIC ECHOCARDIOGRAM (12/04/2020) IMPRESSIONS     1. Left ventricular ejection fraction, by estimation, is 55 to 60%. The  left ventricle has normal function. The left ventricle has no regional  wall motion abnormalities. There is moderate concentric left ventricular  hypertrophy. Left ventricular  diastolic function could not be evaluated. There is the interventricular  septum is flattened in systole and diastole, consistent with right  ventricular pressure and volume overload.   2. Right ventricular systolic function is severely reduced. The right  ventricular size is moderately enlarged. There is moderately elevated  pulmonary artery systolic pressure. The estimated right ventricular  systolic pressure is 53.6 mmHg.   3. Left atrial size was moderately dilated.   4. Right atrial size was moderately dilated.   5. The mitral valve is normal in structure. Trivial mitral valve  regurgitation.   6. The aortic valve is tricuspid. There is mild calcification of the  aortic valve. Aortic valve regurgitation is not visualized. Mild aortic  valve sclerosis is present, with no evidence of aortic valve stenosis.   7. The inferior vena cava is dilated in size with <50% respiratory  variability, suggesting right atrial pressure of 15 mmHg.   Antimicrobials: Cefepime IV   Subjective: Continues to breathe well. No issues overnight.  Objective: Vitals:   12/10/20 0636 12/10/20 0731 12/10/20 0820 12/10/20 1218  BP: 123/82  106/62 (!) 98/52  Pulse:   72 70  Resp:   14 (!) 21  Temp:   98 F (36.7 C) 98 F (36.7 C)  TempSrc:   Oral Oral  SpO2:  98% 98% 93%  Weight:      Height:        Intake/Output Summary (Last 24 hours) at 12/10/2020 1620 Last data filed at 12/10/2020 1350 Gross per  24 hour  Intake --  Output 5950 ml  Net -5950 ml    Filed Weights   12/08/20 0452 12/09/20 0337 12/10/20 0635  Weight: 82.1 kg 78.8 kg 76.6 kg    Examination:  General exam: Appears calm and comfortable Respiratory system: Mild bibasilar rales. Respiratory effort normal. Cardiovascular system: S1 & S2 heard, RRR. No murmurs, rubs, gallops or clicks. Gastrointestinal system: Abdomen is protuberant, non-distended, soft and nontender. Ventral hernia is easily reducible. Normal bowel sounds heard. Central nervous system: Alert and oriented. No focal neurological deficits. Musculoskeletal: No edema. No calf tenderness Skin: No cyanosis. No rashes Psychiatry: Judgement and insight appear normal. Mood & affect appropriate.     Data Reviewed: I have personally reviewed following labs and imaging studies  CBC Lab Results  Component Value Date   WBC 9.2 12/10/2020   RBC 4.61 12/10/2020   HGB 10.3 (L) 12/10/2020   HCT 35.7 (L) 12/10/2020   MCV 77.4 (L) 12/10/2020   MCH 22.3 (L) 12/10/2020   PLT 359 12/10/2020   MCHC 28.9 (L) 12/10/2020   RDW 18.2 (H) 12/10/2020   LYMPHSABS 0.9 12/04/2020   MONOABS 1.0 12/04/2020   EOSABS 0.1 12/04/2020   BASOSABS 0.0 14/43/1540     Last metabolic panel Lab Results  Component Value Date   NA 131 (L) 12/10/2020   K 4.2 12/10/2020   CL 88 (L) 12/10/2020   CO2 33 (H) 12/10/2020   BUN 36 (H) 12/10/2020   CREATININE 0.84 12/10/2020   GLUCOSE 180 (H) 12/10/2020   GFRNONAA >60 12/10/2020   GFRAA 80 (L) 05/14/2013  CALCIUM 8.3 (L) 12/10/2020   PROT 7.4 12/02/2020   ALBUMIN 3.5 12/02/2020   BILITOT 1.1 12/02/2020   ALKPHOS 66 12/02/2020   AST 34 12/02/2020   ALT 19 12/02/2020   ANIONGAP 10 12/10/2020    CBG (last 3)  Recent Labs    12/09/20 2221 12/10/20 0702 12/10/20 1217  GLUCAP 281* 178* 242*      GFR: Estimated Creatinine Clearance: 74.3 mL/min (by C-G formula based on SCr of 0.84 mg/dL).  Coagulation Profile: No  results for input(s): INR, PROTIME in the last 168 hours.  Recent Results (from the past 240 hour(s))  Resp Panel by RT-PCR (Flu A&B, Covid) Nasopharyngeal Swab     Status: None   Collection Time: 12/02/20 10:18 AM   Specimen: Nasopharyngeal Swab; Nasopharyngeal(NP) swabs in vial transport medium  Result Value Ref Range Status   SARS Coronavirus 2 by RT PCR NEGATIVE NEGATIVE Final    Comment: (NOTE) SARS-CoV-2 target nucleic acids are NOT DETECTED.  The SARS-CoV-2 RNA is generally detectable in upper respiratory specimens during the acute phase of infection. The lowest concentration of SARS-CoV-2 viral copies this assay can detect is 138 copies/mL. A negative result does not preclude SARS-Cov-2 infection and should not be used as the sole basis for treatment or other patient management decisions. A negative result may occur with  improper specimen collection/handling, submission of specimen other than nasopharyngeal swab, presence of viral mutation(s) within the areas targeted by this assay, and inadequate number of viral copies(<138 copies/mL). A negative result must be combined with clinical observations, patient history, and epidemiological information. The expected result is Negative.  Fact Sheet for Patients:  EntrepreneurPulse.com.au  Fact Sheet for Healthcare Providers:  IncredibleEmployment.be  This test is no t yet approved or cleared by the [place] FDA and  has been authorized for detection and/or diagnosis of SARS-CoV-2 by FDA under an Emergency Use Authorization (EUA). This EUA will remain  in effect (meaning this test can be used) for the duration of the COVID-19 declaration under Section 564(b)(1) of the Act, 21 U.S.C.section 360bbb-3(b)(1), unless the authorization is terminated  or revoked sooner.       Influenza A by PCR NEGATIVE NEGATIVE Final   Influenza B by PCR NEGATIVE NEGATIVE Final    Comment: (NOTE) The Xpert  Xpress SARS-CoV-2/FLU/RSV plus assay is intended as an aid in the diagnosis of influenza from Nasopharyngeal swab specimens and should not be used as a sole basis for treatment. Nasal washings and aspirates are unacceptable for Xpert Xpress SARS-CoV-2/FLU/RSV testing.  Fact Sheet for Patients: EntrepreneurPulse.com.au  Fact Sheet for Healthcare Providers: IncredibleEmployment.be  This test is not yet approved or cleared by the [place] FDA and has been authorized for detection and/or diagnosis of SARS-CoV-2 by FDA under an Emergency Use Authorization (EUA). This EUA will remain in effect (meaning this test can be used) for the duration of the COVID-19 declaration under Section 564(b)(1) of the Act, 21 U.S.C. section 360bbb-3(b)(1), unless the authorization is terminated or revoked.  Performed at [facility], [place] [address], [place], [place] [address]   Body fluid culture w Gram Stain     Status: None   Collection Time: 12/04/20  2:58 PM   Specimen: Body Fluid  Result Value Ref Range Status   Specimen Description FLUID RIGHT PLEURAL  Final   Special Requests NONE  Final   Gram Stain   Final    FEW WBC PRESENT, PREDOMINANTLY MONONUCLEAR NO ORGANISMS SEEN    Culture  Final    NO GROWTH 3 DAYS Performed at [facility], [place] [address], [place], [place] [address]    Report Status 12/08/2020 FINAL  Final         Radiology Studies: DG Chest 1 View  Result Date: 12/08/2020 CLINICAL DATA:  70 year old male status post thoracentesis. EXAM: CHEST  1 VIEW COMPARISON:  Chest radiograph dated 12/07/2020 FINDINGS: Interval decrease in the size of the right pleural effusion. No pneumothorax. There is cardiomegaly with vascular congestion. Pneumonia is not excluded. No acute osseous pathology. IMPRESSION: Interval decrease in the size of the right pleural effusion. No pneumothorax. Electronically Signed   By: Anner Crete M.D.    On: 12/08/2020 19:11   DG Chest Port 1 View  Result Date: 12/10/2020 CLINICAL DATA:  Pulmonary edema EXAM: PORTABLE CHEST 1 VIEW COMPARISON:  12/08/2020 FINDINGS: Improved aeration at the lung bases. Persistent small right pleural effusion and adjacent atelectasis. No pneumothorax. Stable cardiomediastinal contours. IMPRESSION: Improved lung aeration since 12/08/2020. Persistent small right pleural effusion. Electronically Signed   By: Macy Mis M.D.   On: 12/10/2020 07:43        Scheduled Meds:  amiodarone  200 mg Oral Daily   apixaban  5 mg Oral BID   aspirin EC  81 mg Oral q morning   atorvastatin  40 mg Oral QPM   busPIRone  5 mg Oral BID   diltiazem  30 mg Oral Q6H   docusate sodium  100 mg Oral BID   ferrous sulfate  325 mg Oral Q breakfast   fluticasone furoate-vilanterol  1 puff Inhalation Daily   furosemide  60 mg Intravenous Q8H   insulin aspart  0-15 Units Subcutaneous TID WC   insulin aspart  0-5 Units Subcutaneous QHS   insulin aspart  6 Units Subcutaneous TID WC   insulin glargine  20 Units Subcutaneous Daily   midodrine  10 mg Oral TID WC   pantoprazole  40 mg Oral Daily   polyethylene glycol  17 g Oral Daily   predniSONE  40 mg Oral Q breakfast   Ensure Max Protein  11 oz Oral BID   sildenafil  40 mg Oral TID   sodium chloride flush  3 mL Intravenous Q12H   umeclidinium bromide  1 puff Inhalation Daily   Continuous Infusions:      LOS: 8 days     Cordelia Poche, MD [facility] 12/10/2020, 4:20 PM  If 7PM-7AM, please contact night-coverage [email or web address]

## 2020-12-10 NOTE — Addendum Note (Signed)
Addended by: Ina Homes on: 12/10/2020 09:36 AM   Modules accepted: Orders

## 2020-12-10 NOTE — Progress Notes (Signed)
Please see Dr. Myles Gip note from 7/23 for list of meds to continue.  Patient has followup with Dr. Harl Bowie on 8/9.  Will sign off.  Call with any questions.

## 2020-12-10 NOTE — Progress Notes (Signed)
Physical Therapy Treatment Patient Details Name: Gary [name] MRN: [id number] DOB: Jan 29, 1951 Today's Date: 12/10/2020    History of Present Illness 70 y.o. male presenting to ED 12/02/20 with SOB. Patient admitted with acute on chronic respiratory failure with hypoxia and acute on chronic R-sided heart failure. 7/19 thoracentesis. Previous admission 4/18-5/17 for acute respiratory failure with hypoxia associated with PAH with cor pulmonale. Then went to [facility] 5/17-5/25/22.  PMHx significant for DM, CHF, lung mass, HTN, CHF, and COPD on home O2.    PT Comments    Pt admitted with above diagnosis. Pt was able to ambulate with RW with min guard assist with RW in room doing laps with O2 3L initially and up to 6L to keep sats 85% and greater with activity. Took pt about 5 min to rest before sats >90% and able to turn pt back to 3L at rest. Pt does well with pursed lip breathing and needs cues to not talk while walking.  Daughter present and is very encouraging to pt. Will follow acutely and pt hopeful to go home tomorrow.  Pt with incr distance and overall good response to activity. Pt currently with functional limitations due to balance and endurance deficits. Pt will benefit from skilled PT to increase their independence and safety with mobility to allow discharge to the venue listed below.      Follow Up Recommendations  Home health PT     Equipment Recommendations  None recommended by PT    Recommendations for Other Services       Precautions / Restrictions Precautions Precautions: Fall Precaution Comments: Monitor HR and SpO2 Restrictions Weight Bearing Restrictions: No    Mobility  Bed Mobility               General bed mobility comments: in chair when PT arrived    Transfers Overall transfer level: Needs assistance Equipment used: Rolling walker (2 wheeled) Transfers: Sit to/from Stand Sit to Stand: Min guard         General transfer comment: pt requires help  for lines and safety but can stand well with minmal help  Ambulation/Gait Ambulation/Gait assistance: Min guard Gait Distance (Feet): 80 Feet Assistive device: Rolling walker (2 wheeled) Gait Pattern/deviations: Step-through pattern;Decreased stride length;Trunk flexed Gait velocity: reduced Gait velocity interpretation: <1.31 ft/sec, indicative of household ambulator General Gait Details: required 3 L O2 at rest and 6LO2 with activity to keep sats >88%.  Pt needed reminders for slow pace without talking.  If he doesnt talk, 6L suffficient. Pt did drop once back sitting at rest in chair to 82% and took 62mn to get back to >90% on 6L and then was able to turn O2 to 3L.   Stairs             Wheelchair Mobility    Modified Rankin (Stroke Patients Only)       Balance Overall balance assessment: Needs assistance Sitting-balance support: Feet supported Sitting balance-Leahy Scale: Good     Standing balance support: Bilateral upper extremity supported;During functional activity Standing balance-Leahy Scale: Poor Standing balance comment: static stance min guard, dynamic with min guard as well with RW.                            Cognition Arousal/Alertness: Awake/alert Behavior During Therapy: WFL for tasks assessed/performed Overall Cognitive Status: Within Functional Limits for tasks assessed  Exercises General Exercises - Lower Extremity Ankle Circles/Pumps: AROM;Both;20 reps    General Comments        Pertinent Vitals/Pain Pain Assessment: No/denies pain    Home Living                      Prior Function            PT Goals (current goals can now be found in the care plan section) Acute Rehab PT Goals Patient Stated Goal: To return home. Progress towards PT goals: Progressing toward goals    Frequency    Min 3X/week      PT Plan Current plan remains appropriate     Co-evaluation              AM-PAC PT "6 Clicks" Mobility   Outcome Measure  Help needed turning from your back to your side while in a flat bed without using bedrails?: A Little Help needed moving from lying on your back to sitting on the side of a flat bed without using bedrails?: A Little Help needed moving to and from a bed to a chair (including a wheelchair)?: A Little Help needed standing up from a chair using your arms (e.g., wheelchair or bedside chair)?: A Little Help needed to walk in hospital room?: A Little Help needed climbing 3-5 steps with a railing? : A Lot 6 Click Score: 17    End of Session Equipment Utilized During Treatment: Oxygen;Gait belt Activity Tolerance: Patient limited by fatigue Patient left: in chair;with call bell/phone within reach;with chair alarm set;with family/visitor present Nurse Communication: Mobility status PT Visit Diagnosis: Muscle weakness (generalized) (M62.81);Difficulty in walking, not elsewhere classified (R26.2)     Time: 1021-1173 PT Time Calculation (min) (ACUTE ONLY): 26 min  Charges:  $Gait Training: 23-37 mins                     Hisashi Amadon M,PT Acute Rehab Services [phone] (pager)    [name] 12/10/2020, 4:23 PM

## 2020-12-10 NOTE — TOC Initial Note (Signed)
Transition of Care [facility]) - Initial/Assessment Note    Patient Details  Name: Gary [name] MRN: [id number] Date of Birth: [birth year]  Transition of Care [facility]) CM/SW Contact:    Angelita Ingles, RN Phone Number:[phone]  12/10/2020, 1:20 PM  Clinical Narrative:                 TOC consulted for [facility] needs on patient from home. CM at bedside to offer choice for [facility] services. Patient and wife declined to have [facility] set up at this time. Wife states that she has a friend that works for [employer] and she would like to talk with her before committing to any type of HH. CM has provided wife with CM # and will follow up.   Expected Discharge Plan: [place] Barriers to Discharge: Continued Medical Work up   Patient Goals and CMS Choice Patient states their goals for this hospitalization and ongoing recovery are:: Patient states goal is to get home CMS Medicare.gov Compare Post Acute Care list provided to:: Patient Choice offered to / list presented to : Patient, Spouse  Expected Discharge Plan and Services Expected Discharge Plan: [place] In-house Referral: NA Discharge Planning Services: CM Consult Post Acute Care Choice: [place] arrangements for the past 2 months: Single Family Home                 DME Arranged: N/A DME Agency: NA       HH Arranged: Refused HH (Patient and wife want to review list and will call CM back) [place] Agency: NA        Prior Living Arrangements/Services Living arrangements for the past 2 months: Single Family Home Lives with:: Spouse Patient language and need for interpreter reviewed:: Yes Do you feel safe going back to the place where you live?: Yes      Need for Family Participation in Patient Care: Yes (Comment) Care giver support system in place?: Yes (comment) Current home services:  (n/a) Criminal Activity/Legal Involvement Pertinent to Current Situation/Hospitalization: No - Comment as  needed  Activities of Daily Living Home Assistive Devices/Equipment: Eyeglasses, Dentures (specify type), Shower chair without back, Reacher, Wheelchair, Environmental consultant (specify type), CBG Meter, Cane (specify quad or straight), CPAP (quad) ADL Screening (condition at time of admission) Patient's cognitive ability adequate to safely complete daily activities?: Yes Is the patient deaf or have difficulty hearing?: Yes Does the patient have difficulty seeing, even when wearing glasses/contacts?: No Does the patient have difficulty concentrating, remembering, or making decisions?: Yes Patient able to express need for assistance with ADLs?: Yes Does the patient have difficulty dressing or bathing?: Yes Independently performs ADLs?: No Communication: Needs assistance Is this a change from baseline?: Pre-admission baseline Dressing (OT): Needs assistance Is this a change from baseline?: Pre-admission baseline Grooming: Independent Feeding: Independent Bathing: Needs assistance Is this a change from baseline?: Pre-admission baseline Toileting: Needs assistance Is this a change from baseline?: Pre-admission baseline In/Out Bed: Independent Walks in Home: Independent with device (comment) Does the patient have difficulty walking or climbing stairs?: Yes Weakness of Legs: None Weakness of Arms/Hands: None  Permission Sought/Granted Permission sought to share information with : Family Supports Permission granted to share information with : Yes, Verbal Permission Granted  Share Information with NAME: [name] Sukup     Permission granted to share info w Relationship: wife     Emotional Assessment Appearance:: Appears stated age Attitude/Demeanor/Rapport: Gracious Affect (typically observed): Quiet Orientation: : Oriented to  Self, Oriented to Place, Oriented to  Time, Oriented to Situation Alcohol / Substance Use: Not Applicable Psych Involvement: No (comment)  Admission diagnosis:  Respiratory  distress [R06.03] Acute on chronic respiratory failure with hypoxia (HCC) [J96.21] Patient Active Problem List   Diagnosis Date Noted   S/P thoracentesis    Acute on chronic respiratory failure with hypoxia ([place]) 12/02/2020   Lung mass 10/15/2020   Acute blood loss anemia 10/15/2020   Chronic respiratory failure ([place]) 10/09/2020   COPD with hypoxia ([place]) 10/09/2020   Hypoalbuminemia due to protein-calorie malnutrition (HCC)    Hyponatremia    Leucocytosis    Hypomagnesemia    Supplemental oxygen dependent    Debility 10/02/2020   ARDS (adult respiratory distress syndrome) (HCC)    Recurrent pleural effusion on right    Atypical atrial flutter (HCC)    Acute on chronic right heart failure (HCC)    Acute congestive heart failure ([place]) 09/03/2020   Atrial fibrillation ([place]) 09/03/2020   Acute respiratory failure with hypoxia ([place]) 09/03/2020   Right bundle branch block 09/03/2020   Hyperlipidemia due to type 2 diabetes mellitus ([place])    PCP:  Jalene Mullet, PA-C Pharmacy:   [facility]  ([place], [address] [place] [address] Phone: [phone] Fax: [phone]  [facility] [place], [place] [address] [place] [address] Phone: [phone] Fax: [phone]  [facility] [address] [place] [address] Phone: [phone] Fax: [phone]     Social Determinants of Health (SDOH) Interventions    Readmission Risk Interventions Readmission Risk Prevention Plan 12/10/2020  Transportation Screening Complete  Medication Review ([place]) Referral to Pharmacy  PCP or Specialist appointment within 3-5 days of discharge Complete  HRI or [place] Complete  SW Recovery Care/Counseling Consult Complete  [place] Not Applicable  Some recent data might be  hidden

## 2020-12-10 NOTE — Progress Notes (Signed)
ANTICOAGULATION CONSULT NOTE - Follow Up Consult  Pharmacy Consult for Heparin Indication: atrial fibrillation  Labs: Recent Labs    12/08/20 0242 12/09/20 0130 12/10/20 0359  HGB 10.0* 9.5* 10.3*  HCT 33.5* 32.7* 35.7*  PLT 357 359 359  APTT 65*  --   --   HEPARINUNFRC 0.58 0.56 0.87*  CREATININE 0.88 0.91 0.84   Assessment: 70 YO male who presented with shortness of breath and pleural effusion with a history of atrial fibrillation on apixaban 5 mg BID with last dose on 7/17 at 0700. Patient underwent thoracentesis on 7/19 and 7/23 so he was transitioned to IV heparin and held home apixaban. Heparin level this am 0.87 units/ml  Hg 10.3, PTLC 359  Goal of Therapy:  Heparin level 0.3-0.7 units/ml Monitor platelets by anticoagulation protocol: Yes   Plan:  Decrease IV heparin to 1650 units/hr Check heparin level 6 hours after rate change Monitor daily HL, H&H, and platelets F/u on resuming home Eliquis  Thanks for allowing pharmacy to be a part of this patient's care.  Excell Seltzer, PharmD Clinical Pharmacist

## 2020-12-11 ENCOUNTER — Other Ambulatory Visit (HOSPITAL_COMMUNITY): Payer: Self-pay

## 2020-12-11 DIAGNOSIS — I50813 Acute on chronic right heart failure: Secondary | ICD-10-CM | POA: Diagnosis not present

## 2020-12-11 DIAGNOSIS — E1169 Type 2 diabetes mellitus with other specified complication: Secondary | ICD-10-CM

## 2020-12-11 DIAGNOSIS — J9621 Acute and chronic respiratory failure with hypoxia: Secondary | ICD-10-CM | POA: Diagnosis not present

## 2020-12-11 DIAGNOSIS — I4891 Unspecified atrial fibrillation: Secondary | ICD-10-CM | POA: Diagnosis not present

## 2020-12-11 DIAGNOSIS — E785 Hyperlipidemia, unspecified: Secondary | ICD-10-CM

## 2020-12-11 DIAGNOSIS — J449 Chronic obstructive pulmonary disease, unspecified: Secondary | ICD-10-CM | POA: Diagnosis not present

## 2020-12-11 LAB — CBC
HCT: 34.9 % — ABNORMAL LOW (ref 39.0–52.0)
Hemoglobin: 10.4 g/dL — ABNORMAL LOW (ref 13.0–17.0)
MCH: 22.6 pg — ABNORMAL LOW (ref 26.0–34.0)
MCHC: 29.8 g/dL — ABNORMAL LOW (ref 30.0–36.0)
MCV: 75.7 fL — ABNORMAL LOW (ref 80.0–100.0)
Platelets: 396 10*3/uL (ref 150–400)
RBC: 4.61 MIL/uL (ref 4.22–5.81)
RDW: 18 % — ABNORMAL HIGH (ref 11.5–15.5)
WBC: 9.4 10*3/uL (ref 4.0–10.5)
nRBC: 0 % (ref 0.0–0.2)

## 2020-12-11 LAB — BASIC METABOLIC PANEL
Anion gap: 9 (ref 5–15)
BUN: 47 mg/dL — ABNORMAL HIGH (ref 8–23)
CO2: 34 mmol/L — ABNORMAL HIGH (ref 22–32)
Calcium: 8.6 mg/dL — ABNORMAL LOW (ref 8.9–10.3)
Chloride: 89 mmol/L — ABNORMAL LOW (ref 98–111)
Creatinine, Ser: 0.88 mg/dL (ref 0.61–1.24)
GFR, Estimated: >60 mL/min (ref >60)
Glucose, Bld: 265 mg/dL — ABNORMAL HIGH (ref 70–99)
Potassium: 4.1 mmol/L (ref 3.5–5.1)
Sodium: 132 mmol/L — ABNORMAL LOW (ref 135–145)

## 2020-12-11 LAB — MAGNESIUM: Magnesium: 1.8 mg/dL (ref 1.7–2.4)

## 2020-12-11 LAB — GLUCOSE, CAPILLARY
Glucose-Capillary: 171 mg/dL — ABNORMAL HIGH (ref 70–99)
Glucose-Capillary: 295 mg/dL — ABNORMAL HIGH (ref 70–99)

## 2020-12-11 MED ORDER — ENSURE MAX PROTEIN PO LIQD: 11.0000 [oz_av] | Freq: Two times a day (BID) | ORAL | Status: AC

## 2020-12-11 NOTE — Care Management Important Message (Signed)
Important Message  Patient Details  Name: Caine Barfield MRN: [id number] Date of Birth: [birth year]   Medicare Important Message Given:  Yes     Barb Merino Lannon 12/11/2020, 12:53 PM

## 2020-12-11 NOTE — TOC Progression Note (Signed)
Transition of Care [facility]) - Progression Note    Patient Details  Name: Gary [name] MRN: [id number] Date of Birth: 1950-11-14  Transition of Care [facility]) CM/SW [place], RN Phone [phone]  12/11/2020, 12:24 PM  Clinical Narrative:    CM at bedside to discuss disposition. Patient states that he already has home O2 through [employer] . Wife states that they do not need home health because they are currently active with [facility] for [facility] therapy. Patient also declines [place] for heart failure screen. No other needs noted at this time. TOC will sign off.   Expected Discharge Plan: [place] Barriers to Discharge: No Barriers Identified  Expected Discharge Plan and Services Expected Discharge Plan: [place] In-house Referral: NA Discharge Planning Services: CM Consult Post Acute Care Choice: [place] arrangements for the past 2 months: Single Family Home Expected Discharge Date: 12/11/20               DME Arranged: N/A DME Agency: NA       HH Arranged: Patient Refused [place] (states that they already have [place] set up with [facility]) [place] Agency: Other - See comment ([place])         Social Determinants of Health (SDOH) Interventions    Readmission Risk Interventions Readmission Risk Prevention Plan 12/10/2020  Transportation Screening Complete  Medication Review Press [place]) Referral to Pharmacy  PCP or Specialist appointment within 3-5 days of discharge Complete  HRI or [place] Complete  SW Recovery Care/Counseling Consult Complete  [place] Not Applicable  Some recent data might be hidden

## 2020-12-11 NOTE — Consult Note (Signed)
   [facility] Inpatient Consult   12/11/2020  Gary [name] [facility] 03/29/1951 [id number]  Follow up: [place] Medicare CMS DCE  Spoke with the patient at the bedside, patient confirms his HIPAA and primary care provider. Spoke with patient regarding post hospital follow up calls and currently agrees to  [place] [facility] RN telephonic follow up for care management for complex disease follow up.  Plan:  Will assign to a [place].  For questions,  Natividad Brood, RN BSN [facility] Liaison  [phone] business mobile phone Toll free office [phone]  Fax number: [phone] [email or web address]

## 2020-12-11 NOTE — Discharge Summary (Signed)
Physician Discharge Summary  Gary [name] VHQ:[id number] DOB: [birth year] DOA: 12/02/2020  PCP: Jalene Mullet, PA-C  Admit date: 12/02/2020 Discharge date: 12/11/2020  Admitted From: Home Disposition: Home  Recommendations for Outpatient Follow-up:  Follow up with PCP in 1 week Outpatient pulmonology follow-up for consideration of lung biopsy Please follow up on the following pending results: None  Home Health: PT/OT Equipment/Devices: Oxygen  Discharge Condition: Stable CODE STATUS: Full code Diet recommendation: Heart healthy   Brief/Interim Summary:  Admission HPI written by Karmen Bongo, MD   HPI: Gary [name] is a 70 y.o. male with medical history significant of DM; HTN; HLD; CHF; and COPD on home O2 presenting with SOB.  He reports he has had SOB, he "felt bad, felt like a truck had hit me."  He noticed it late yesterday afternoon but slept well on Trilogy.  He woke up this AM about 0630 and drank coffee and felt bad.  No change in cough.  No fever or respiratory symptoms.  No change in weight.  +LE edema.  No medication changes or compliance issues.  No sick contacts.   He was last admitted from 4/18-5/17 for acute respiratory failure with hypoxia associated with PAH with cor pulmonale.  While hospitalized, he had an UGI bleed and was intubated to undergo EGD, which showed 2 bleeding ulcers, H. Pylori positive.  He required 3 units PRBC.  He also developed new onset afib and eventually started [facility] once his GI bleed stabilized.  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   Hospital course:  Acute on chronic respiratory failure with hypoxia and hypercapnia COPD CT chest significant for right pleural effusion, right middle lobe mass and right airspace consolidation. Thoracentesis performed on 7/19 significant for likely exudative fluid; culture with no growth to date.  Patient reports feeling a little better since the  thoracentesis and is improving with Lasix diuresis. Patient was empirically treated with cefepime IV which was discontinued after negative procalcitonin. Pulmonology re-consulted secondary to worsening oxygen needs and performed bedside thoracentesis on 7/23 yielding ~1300 mL of fluid; cytology again negative for malignant cells. Oxygen use decreased after thoracentesis and high dose IV diuresis. Patient weaned down to 3 lpm prior to discharge. Transitioned back to home torsemide. Discharged with oxygen.   Right lung mass Associated mediastinal adenopathy.  Unable to biopsy secondary to severe hypoxia. Cytology (7/19; 7/23) without evidence of malignant cells. Patient to follow-up with pulmonology as an outpatient for consideration of mass biopsy.   Recurrent pleural effusion Right sided. Concern this is malignant. Patient has required two large volume thoracenteses so far. May need to consider Pleurx catheter if reaccumulation is too quick.   Atrial fibrillation with RVR Patient is on Eliquis, amiodarone and Cardizem as an outpatient. Started on Cardizem IV and now transitioned to PO. Eliquis on hold secondary to procedures and patient started on heparin drip. Cardiology consulted for management. Patient managed on home regimen. Continue on discharge.   Acute on chronic right-sided heart failure Patient is on Demadex as an outpatient. Treated with IV lasix and transitioned back to home torsemide. Weight on discharge of 168 lbs (76.3 kg).   Pulmonary artery hypertension OSA/OHS Continue Revatio and Continue trilogy.   Hypotension Chronic. Patient is on midodrine as an outpatient. Complicated by above problems. Continue midodrine.   Hyperlipidemia Continue Lipitor   Microcytic anemia Patient is on iron supplementation as an outpatient. Stable.   Hypokalemia Hypomagnesemia Replete  as needed.   Diabetes mellitus, type 2 Hemoglobin A1C of 5.2%. Patient is on Jardiance, metformin as an  outpatient. Started on SSI inpatient. Uncontrolled secondary to steroids. Steroids discontinued on discharge. Continue home regimen on discharge with anticipation that blood sugar will normalize with cessation of steroids.  Discharge Diagnoses:  Principal Problem:   Acute on chronic respiratory failure with hypoxia (HCC) Active Problems:   Hyperlipidemia due to type 2 diabetes mellitus (HCC)   Atrial fibrillation (HCC)   Acute on chronic right heart failure (HCC)   Recurrent pleural effusion on right   COPD with hypoxia (HCC)   Lung mass   S/P thoracentesis    Discharge Instructions  Discharge Instructions     Call MD for:  difficulty breathing, headache or visual disturbances   Complete by: As directed       Allergies as of 12/11/2020       Reactions   Novocain [procaine] Other (See Comments)   Unknown reaction as a young child        Medication List     TAKE these medications    acetaminophen 325 MG tablet Commonly known as: TYLENOL Take 650 mg by mouth every 6 (six) hours as needed for headache (pain).   amiodarone 200 MG tablet Commonly known as: PACERONE Take 1 tablet (200 mg total) by mouth daily.   aspirin EC 81 MG tablet Take 81 mg by mouth every morning. Swallow whole.   atorvastatin 40 MG tablet Commonly known as: LIPITOR Take 1 tablet (40 mg total) by mouth every evening.   Breo Ellipta 200-25 MCG/INH Aepb Generic drug: fluticasone furoate-vilanterol Inhale 1 puff into the lungs daily.   busPIRone 15 MG tablet Commonly known as: BUSPAR Take 7.5 mg by mouth 2 (two) times daily.   Cartia XT 180 MG 24 hr capsule Generic drug: diltiazem Take 1 capsule (180 mg total) by mouth daily.   Eliquis 5 MG Tabs tablet Generic drug: apixaban Take 1 tablet (5 mg total) by mouth 2 (two) times daily.   Ensure Max Protein Liqd Take 330 mLs (11 oz total) by mouth 2 (two) times daily.   Incruse Ellipta 62.5 MCG/INH Aepb Generic drug: umeclidinium  bromide Inhale 1 puff into the lungs daily. What changed: when to take this   IRON PO Take 1 tablet by mouth daily.   Jardiance 10 MG Tabs tablet Generic drug: empagliflozin Take 1 tablet (10 mg total) by mouth daily.   magnesium gluconate 500 MG tablet Commonly known as: MAGONATE Take 1 tablet (500 mg total) by mouth 2 (two) times daily.   metFORMIN 500 MG tablet Commonly known as: GLUCOPHAGE Take 2 tablets (1,000 mg total) by mouth 2 (two) times daily with a meal.   midodrine 10 MG tablet Commonly known as: PROAMATINE Take 1 tablet (10 mg total) by mouth 3 (three) times daily with meals.   multivitamin with minerals Tabs tablet Take 1 tablet by mouth daily.   pantoprazole 40 MG tablet Commonly known as: PROTONIX Take 1 tablet (40 mg total) by mouth daily.   polyethylene glycol 17 g packet Commonly known as: MIRALAX / GLYCOLAX Take 17 g by mouth daily.   potassium chloride SA 20 MEQ tablet Commonly known as: KLOR-CON Take 1 tablet (20 mEq total) by mouth daily.   sildenafil 20 MG tablet Commonly known as: REVATIO Take 2 tablets (40 mg total) by mouth 3 (three) times daily.   torsemide 20 MG tablet Commonly known as: DEMADEX Take 1 tablet (20  mg total) by mouth daily.               Durable Medical Equipment  (From admission, onward)           Start     Ordered   12/11/20 1120  For home use only DME oxygen  Once       Comments: 3 L/min while at rest. 6 L/min while ambulating.  Question Answer Comment  Length of Need Lifetime   Mode or (Route) Nasal cannula   Liters per Minute 6   Frequency Continuous (stationary and portable oxygen unit needed)   Oxygen delivery system Gas      12/11/20 1119            Allergies  Allergen Reactions   Novocain [Procaine] Other (See Comments)    Unknown reaction as a young child    Consultations: Pulmonology Cardiology   Procedures/Studies: DG Chest 1 View  Result Date: 12/08/2020 CLINICAL  DATA:  70 year old male status post thoracentesis. EXAM: CHEST  1 VIEW COMPARISON:  Chest radiograph dated 12/07/2020 FINDINGS: Interval decrease in the size of the right pleural effusion. No pneumothorax. There is cardiomegaly with vascular congestion. Pneumonia is not excluded. No acute osseous pathology. IMPRESSION: Interval decrease in the size of the right pleural effusion. No pneumothorax. Electronically Signed   By: Anner Crete M.D.   On: 12/08/2020 19:11   CT CHEST WO CONTRAST  Result Date: 12/04/2020 CLINICAL DATA:  Dyspnea, hypoxia, pulmonary nodule EXAM: CT CHEST WITHOUT CONTRAST TECHNIQUE: Multidetector CT imaging of the chest was performed following the standard protocol without IV contrast. COMPARISON:  12/02/2020, 09/20/2020 FINDINGS: Cardiovascular: Extensive multi-vessel coronary artery calcification. Mild global cardiomegaly. No pericardial effusion. Central pulmonary arteries are enlarged in keeping with changes of pulmonary arterial hypertension. The thoracic aorta is of normal caliber. Moderate atherosclerotic calcification noted throughout the thoracic aorta. Mediastinum/Nodes: Visualized thyroid is unremarkable. There is extensive mediastinal adenopathy which appears grossly stable since prior examination right hilar adenopathy is also suspected though is difficult to clearly delineate in absence of contrast administration. Precarinal lymph node measures 1.9 x 2.8 cm in greatest dimension, axial # 61. The esophagus is unremarkable. Lungs/Pleura: Interval right thoracentesis. Small pleural fluid persists. Improved aeration of the a right middle and lower lobes with extensive multifocal consolidation persisting in these regions, possibly reflective of superimposed atypical infectious process. There is, additionally, progressive ground-glass infiltrate within the left upper lobe and patchy infiltrate within the right upper lobe. As noted previously, there is engorgement of the central  pulmonary vasculature as well as ground-glass pulmonary infiltrate within the lung bases bilaterally as noted interlobular septal thickening suggesting at least some degree of superimposed pulmonary edema, slightly improved since prior examination. No pneumothorax. Central airways are widely patent. Known spiculated mass within the right middle lobe is obscured by multifocal consolidation within this region. Upper Abdomen: No acute abnormality. Musculoskeletal: No acute bone abnormality. Osseous structures are age-appropriate. No lytic or blastic bone lesion. IMPRESSION: Interval right thoracentesis. Small residual pleural fluid. Improved aeration of the right lung base. Findings in keeping with moderate pulmonary edema, improved since prior examination. Superimposed progressive multifocal consolidation within the right lung base and airspace infiltrate within the upper lung zones bilaterally suggestive of a superimposed atypical infectious or inflammatory process. No central obstructing lesion. Stable mediastinal adenopathy. Extensive multi-vessel coronary artery calcification. Morphologic changes in keeping with pulmonary arterial hypertension. Stable cardiomegaly. Aortic Atherosclerosis (ICD10-I70.0). Electronically Signed   By: Fidela Salisbury MD   On:  12/04/2020 20:43   CT Chest Wo Contrast  Result Date: 12/02/2020 CLINICAL DATA:  Pleural effusion. EXAM: CT CHEST WITHOUT CONTRAST TECHNIQUE: Multidetector CT imaging of the chest was performed following the standard protocol without IV contrast. COMPARISON:  Sep 20, 2020 FINDINGS: Cardiovascular: Enlarged cardiac silhouette. Calcific atherosclerotic disease of the coronary arteries and aorta. Mediastinum/Nodes: Increasing in mediastinal and right hilar lymphadenopathy. There is narrowing of the right upper and middle lobe bronchi. Lungs/Pleura: Increased in size right pleural effusion which now occupies approximately 50% of the right hemithorax. Previously  demonstrated spiculated mass in the anterior aspect of the right middle lobe is obscured by airspace consolidation. Airspace consolidation/atelectasis is also present in most of the right lower lobe. Diffuse hazy airspace opacities persist throughout both lungs. Upper Abdomen: No acute abnormality. Musculoskeletal: No chest wall mass or suspicious bone lesions identified. IMPRESSION: 1. Increased in size right pleural effusion which now occupies approximately 50% of the right hemithorax. 2. Previously demonstrated spiculated mass in the anterior aspect of the right middle lobe is obscured by airspace consolidation. 3. Airspace consolidation/atelectasis in most of the right lower lobe and middle lobe. 4. Diffuse hazy airspace opacities persist throughout both lungs. 5. Increasing in mediastinal and right hilar lymphadenopathy. 6. Enlarged cardiac silhouette. 7. Calcific atherosclerotic disease of the coronary arteries and aorta. 8. Aortic atherosclerosis. Aortic Atherosclerosis (ICD10-I70.0). Electronically Signed   By: Fidela Salisbury M.D.   On: 12/02/2020 12:36   DG Chest Port 1 View  Result Date: 12/10/2020 CLINICAL DATA:  Pulmonary edema EXAM: PORTABLE CHEST 1 VIEW COMPARISON:  12/08/2020 FINDINGS: Improved aeration at the lung bases. Persistent small right pleural effusion and adjacent atelectasis. No pneumothorax. Stable cardiomediastinal contours. IMPRESSION: Improved lung aeration since 12/08/2020. Persistent small right pleural effusion. Electronically Signed   By: Macy Mis M.D.   On: 12/10/2020 07:43   DG CHEST PORT 1 VIEW  Result Date: 12/07/2020 CLINICAL DATA:  70 year old male with hypoxia. EXAM: PORTABLE CHEST 1 VIEW COMPARISON:  Chest radiograph dated 12/04/2020. FINDINGS: There is cardiomegaly with central vascular congestion, improved since the prior radiograph. Small right pleural effusion and right lung base atelectasis or infiltrate. No pneumothorax. No acute osseous pathology.  IMPRESSION: 1. Cardiomegaly with improving vascular congestion. 2. Small right pleural effusion and right lung base atelectasis or infiltrate. Electronically Signed   By: Anner Crete M.D.   On: 12/07/2020 20:10   DG CHEST PORT 1 VIEW  Result Date: 12/04/2020 CLINICAL DATA:  Right thoracentesis, cough, short of breath EXAM: PORTABLE CHEST 1 VIEW COMPARISON:  12/02/2020 FINDINGS: Single frontal view of the chest demonstrates an enlarged cardiac silhouette. Persistent central vascular congestion and interstitial prominence. Decreased right pleural effusion after thoracentesis. Persistent consolidation at the right lung base consistent with atelectasis. No pneumothorax. No acute bony abnormalities. IMPRESSION: 1. No complication after right thoracentesis, with small residual right pleural effusion identified. 2. Stable right basilar atelectasis. 3. Continued central vascular congestion and interstitial prominence compatible with volume overload. Electronically Signed   By: Randa Ngo M.D.   On: 12/04/2020 18:10   DG Chest Port 1 View  Result Date: 12/02/2020 CLINICAL DATA:  Short of breath. EXAM: PORTABLE CHEST 1 VIEW COMPARISON:  Radiograph 09/28/2020 FINDINGS: A stable large cardiac silhouette. There is dense opacification of the RIGHT lower lobe which is new from prior. Central venous congestion is present. No pneumothorax. IMPRESSION: Dense opacification of the RIGHT lower lobe consistent with increased RIGHT pleural effusion. Presumably a component of atelectasis in the RIGHT lower lobe. Cannot  exclude RIGHT lower lobe infiltrate Electronically Signed   By: Suzy Bouchard M.D.   On: 12/02/2020 10:53   ECHOCARDIOGRAM LIMITED  Result Date: 12/04/2020    ECHOCARDIOGRAM LIMITED REPORT   Patient Name:   RAIFORD FETTERMAN Date of Exam: 12/04/2020 Medical Rec #:  [id number]          Height:       67.0 in Accession #:    [id number]         Weight:       175.7 lb Date of Birth:  [birth year]          BSA:           1.914 m Patient Age:    60 years           BP:           113/71 mmHg Patient Gender: M                  HR:           101 bpm. Exam Location:  Inpatient Procedure: Limited Echo, Cardiac Doppler, Color Doppler and 3D Echo Indications:    R06.02 SOB  History:        Patient has prior history of Echocardiogram examinations, most                 recent 09/25/2020. Abnormal ECG, COPD, Arrythmias:Atrial Flutter,                 Atrial Fibrillation and RBBB, Signs/Symptoms:Shortness of Breath                 and Dyspnea; Risk Factors:Dyslipidemia. RV failure.  Sonographer:    [place] Referring Phys: [id number] Abigail Butts  Sonographer Comments: Patient in high fowler's position due to hypoxia. Pateint destatted to 52 just moving from chair to bed for echo. IMPRESSIONS  1. Left ventricular ejection fraction, by estimation, is 55 to 60%. The left ventricle has normal function. The left ventricle has no regional wall motion abnormalities. There is moderate concentric left ventricular hypertrophy. Left ventricular diastolic function could not be evaluated. There is the interventricular septum is flattened in systole and diastole, consistent with right ventricular pressure and volume overload.  2. Right ventricular systolic function is severely reduced. The right ventricular size is moderately enlarged. There is moderately elevated pulmonary artery systolic pressure. The estimated right ventricular systolic pressure is 84.5 mmHg.  3. Left atrial size was moderately dilated.  4. Right atrial size was moderately dilated.  5. The mitral valve is normal in structure. Trivial mitral valve regurgitation.  6. The aortic valve is tricuspid. There is mild calcification of the aortic valve. Aortic valve regurgitation is not visualized. Mild aortic valve sclerosis is present, with no evidence of aortic valve stenosis.  7. The inferior vena cava is dilated in size with <50% respiratory variability, suggesting right  atrial pressure of 15 mmHg. Comparison(s): No significant change from prior study. Prior images reviewed side by side. FINDINGS  Left Ventricle: Left ventricular ejection fraction, by estimation, is 55 to 60%. The left ventricle has normal function. The left ventricle has no regional wall motion abnormalities. The left ventricular internal cavity size was normal in size. There is  moderate concentric left ventricular hypertrophy. The interventricular septum is flattened in systole and diastole, consistent with right ventricular pressure and volume overload. Left ventricular diastolic function could not be evaluated. Left ventricular diastolic function could not be evaluated due to atrial  fibrillation. Right Ventricle: The right ventricular size is moderately enlarged. Right ventricular systolic function is severely reduced. There is moderately elevated pulmonary artery systolic pressure. The tricuspid regurgitant velocity is 3.33 m/s, and with an assumed right atrial pressure of 15 mmHg, the estimated right ventricular systolic pressure is 83.4 mmHg. Left Atrium: Left atrial size was moderately dilated. Right Atrium: Right atrial size was moderately dilated. Pericardium: There is no evidence of pericardial effusion. Mitral Valve: The mitral valve is normal in structure. There is mild thickening of the mitral valve leaflet(s). Mild mitral annular calcification. Trivial mitral valve regurgitation. Tricuspid Valve: The tricuspid valve is normal in structure. Tricuspid valve regurgitation is mild. Aortic Valve: The aortic valve is tricuspid. There is mild calcification of the aortic valve. Aortic valve regurgitation is not visualized. Mild aortic valve sclerosis is present, with no evidence of aortic valve stenosis. Pulmonic Valve: The pulmonic valve was normal in structure. Pulmonic valve regurgitation is trivial. Aorta: The aortic root and ascending aorta are structurally normal, with no evidence of dilitation.  Venous: The inferior vena cava is dilated in size with less than 50% respiratory variability, suggesting right atrial pressure of 15 mmHg. IAS/Shunts: No atrial level shunt detected by color flow Doppler. Additional Comments: There is pleural effusion in the right lateral region. LEFT VENTRICLE PLAX 2D LVIDd:         3.50 cm LVIDs:         2.60 cm LV PW:         1.60 cm LV IVS:        1.40 cm LVOT diam:     1.90 cm LVOT Area:     2.84 cm  LV Volumes (MOD) LV vol d, MOD A2C: 88.8 ml LV vol d, MOD A4C: 59.3 ml LV vol s, MOD A2C: 38.6 ml LV vol s, MOD A4C: 28.4 ml LV SV MOD A2C:     50.2 ml LV SV MOD A4C:     59.3 ml LV SV MOD BP:      41.1 ml IVC IVC diam: 2.40 cm LEFT ATRIUM         Index LA diam:    4.60 cm 2.40 cm/m   AORTA Ao Root diam: 3.00 cm Ao Asc diam:  3.90 cm TRICUSPID VALVE TR Peak grad:   44.4 mmHg TR Vmax:        333.00 cm/s  SHUNTS Systemic Diam: 1.90 cm Dani Gobble Croitoru MD Electronically signed by Sanda Klein MD Signature Date/Time: 12/04/2020/10:04:05 AM    Final      Subjective: Breathing well. No issues overnight. No chest pain or dyspnea.   Discharge Exam: Vitals:   12/11/20 0532 12/11/20 0811  BP: 111/65   Pulse: 76   Resp: 18   Temp: 98.3 F (36.8 C)   SpO2: 93% 93%   Vitals:   12/10/20 2338 12/11/20 0530 12/11/20 0532 12/11/20 0811  BP: (!) 112/59  111/65   Pulse: 73  76   Resp: (!) 22  18   Temp:   98.3 F (36.8 C)   TempSrc:      SpO2: 100% 94% 93% 93%  Weight:   76.3 kg   Height:        General: Pt is alert, awake, not in acute distress Cardiovascular: RRR, S1/S2 +, no rubs, no gallops Respiratory: CTA bilaterally mostly but with some rales at bases, no wheezing, no rhonchi Abdominal: Soft, NT, distended with ventral hernia, bowel sounds + Extremities: no edema, no cyanosis  The results of significant diagnostics from this hospitalization (including imaging, microbiology, ancillary and laboratory) are listed below for reference.      Microbiology: Recent Results (from the past 240 hour(s))  Resp Panel by RT-PCR (Flu A&B, Covid) Nasopharyngeal Swab     Status: None   Collection Time: 12/02/20 10:18 AM   Specimen: Nasopharyngeal Swab; Nasopharyngeal(NP) swabs in vial transport medium  Result Value Ref Range Status   SARS Coronavirus 2 by RT PCR NEGATIVE NEGATIVE Final    Comment: (NOTE) SARS-CoV-2 target nucleic acids are NOT DETECTED.  The SARS-CoV-2 RNA is generally detectable in upper respiratory specimens during the acute phase of infection. The lowest concentration of SARS-CoV-2 viral copies this assay can detect is 138 copies/mL. A negative result does not preclude SARS-Cov-2 infection and should not be used as the sole basis for treatment or other patient management decisions. A negative result may occur with  improper specimen collection/handling, submission of specimen other than nasopharyngeal swab, presence of viral mutation(s) within the areas targeted by this assay, and inadequate number of viral copies(<138 copies/mL). A negative result must be combined with clinical observations, patient history, and epidemiological information. The expected result is Negative.  Fact Sheet for Patients:  EntrepreneurPulse.com.au  Fact Sheet for Healthcare Providers:  IncredibleEmployment.be  This test is no t yet approved or cleared by the [place] FDA and  has been authorized for detection and/or diagnosis of SARS-CoV-2 by FDA under an Emergency Use Authorization (EUA). This EUA will remain  in effect (meaning this test can be used) for the duration of the COVID-19 declaration under Section 564(b)(1) of the Act, 21 U.S.C.section 360bbb-3(b)(1), unless the authorization is terminated  or revoked sooner.       Influenza A by PCR NEGATIVE NEGATIVE Final   Influenza B by PCR NEGATIVE NEGATIVE Final    Comment: (NOTE) The Xpert Xpress SARS-CoV-2/FLU/RSV plus assay is  intended as an aid in the diagnosis of influenza from Nasopharyngeal swab specimens and should not be used as a sole basis for treatment. Nasal washings and aspirates are unacceptable for Xpert Xpress SARS-CoV-2/FLU/RSV testing.  Fact Sheet for Patients: EntrepreneurPulse.com.au  Fact Sheet for Healthcare Providers: IncredibleEmployment.be  This test is not yet approved or cleared by the [place] FDA and has been authorized for detection and/or diagnosis of SARS-CoV-2 by FDA under an Emergency Use Authorization (EUA). This EUA will remain in effect (meaning this test can be used) for the duration of the COVID-19 declaration under Section 564(b)(1) of the Act, 21 U.S.C. section 360bbb-3(b)(1), unless the authorization is terminated or revoked.  Performed at [facility], [place] [address] [place], [place] [address]   Body fluid culture w Gram Stain     Status: None   Collection Time: 12/04/20  2:58 PM   Specimen: Body Fluid  Result Value Ref Range Status   Specimen Description FLUID RIGHT PLEURAL  Final   Special Requests NONE  Final   Gram Stain   Final    FEW WBC PRESENT, PREDOMINANTLY MONONUCLEAR NO ORGANISMS SEEN    Culture   Final    NO GROWTH 3 DAYS Performed at [facility], [address], [place], [place] [address]    Report Status 12/08/2020 FINAL  Final     Labs: BNP (last 3 results) Recent Labs    09/20/20 0047 12/02/20 1018 12/06/20 0411  BNP 360.6* 574.4* 557.3*   Basic Metabolic Panel: Recent Labs  Lab 12/05/20 2241 12/06/20 1303 12/07/20 0243 12/08/20  3646 12/09/20 0130 12/10/20 0359 12/11/20 0124  NA  --    < > 131* 136 134* 131* 132*  K 3.7   < > 4.2 3.7 3.5 4.2 4.1  CL  --    < > 93* 96* 91* 88* 89*  CO2  --    < > 29 32 32 33* 34*  GLUCOSE  --    < > 257* 186* 305* 180* 265*  BUN  --    < > 37* 38* 35* 36* 47*  CREATININE  --    < > 1.00 0.88 0.91 0.84 0.88  CALCIUM  --    < > 8.4*  8.4* 8.2* 8.3* 8.6*  MG 1.6*  --  2.0  --  1.7 2.1 1.8   < > = values in this interval not displayed.   Liver Function Tests: No results for input(s): AST, ALT, ALKPHOS, BILITOT, PROT, ALBUMIN in the last 168 hours. No results for input(s): LIPASE, AMYLASE in the last 168 hours. No results for input(s): AMMONIA in the last 168 hours. CBC: Recent Labs  Lab 12/07/20 0243 12/08/20 0242 12/09/20 0130 12/10/20 0359 12/11/20 0124  WBC 8.4 10.7* 8.3 9.2 9.4  HGB 8.8* 10.0* 9.5* 10.3* 10.4*  HCT 30.4* 33.5* 32.7* 35.7* 34.9*  MCV 77.9* 77.2* 77.1* 77.4* 75.7*  PLT 324 357 359 359 396   Cardiac Enzymes: No results for input(s): CKTOTAL, CKMB, CKMBINDEX, TROPONINI in the last 168 hours. BNP: Invalid input(s): POCBNP CBG: Recent Labs  Lab 12/10/20 0702 12/10/20 1217 12/10/20 1626 12/10/20 2011 12/11/20 0731  GLUCAP 178* 242* 308* 316* 171*   D-Dimer No results for input(s): DDIMER in the last 72 hours. Hgb A1c No results for input(s): HGBA1C in the last 72 hours. Lipid Profile No results for input(s): CHOL, HDL, LDLCALC, TRIG, CHOLHDL, LDLDIRECT in the last 72 hours. Thyroid function studies No results for input(s): TSH, T4TOTAL, T3FREE, THYROIDAB in the last 72 hours.  Invalid input(s): FREET3 Anemia work up No results for input(s): VITAMINB12, FOLATE, FERRITIN, TIBC, IRON, RETICCTPCT in the last 72 hours. Urinalysis No results found for: COLORURINE, APPEARANCEUR, [place], [place], GLUCOSEU, [place], [place], [place], PROTEINUR, UROBILINOGEN, NITRITE, LEUKOCYTESUR Sepsis Labs Invalid input(s): PROCALCITONIN,  WBC,  LACTICIDVEN Microbiology Recent Results (from the past 240 hour(s))  Resp Panel by RT-PCR (Flu A&B, Covid) Nasopharyngeal Swab     Status: None   Collection Time: 12/02/20 10:18 AM   Specimen: Nasopharyngeal Swab; Nasopharyngeal(NP) swabs in vial transport medium  Result Value Ref Range Status   SARS Coronavirus 2 by RT PCR NEGATIVE NEGATIVE Final     Comment: (NOTE) SARS-CoV-2 target nucleic acids are NOT DETECTED.  The SARS-CoV-2 RNA is generally detectable in upper respiratory specimens during the acute phase of infection. The lowest concentration of SARS-CoV-2 viral copies this assay can detect is 138 copies/mL. A negative result does not preclude SARS-Cov-2 infection and should not be used as the sole basis for treatment or other patient management decisions. A negative result may occur with  improper specimen collection/handling, submission of specimen other than nasopharyngeal swab, presence of viral mutation(s) within the areas targeted by this assay, and inadequate number of viral copies(<138 copies/mL). A negative result must be combined with clinical observations, patient history, and epidemiological information. The expected result is Negative.  Fact Sheet for Patients:  EntrepreneurPulse.com.au  Fact Sheet for Healthcare Providers:  IncredibleEmployment.be  This test is no t yet approved or cleared by the [place] FDA and  has been authorized for detection  and/or diagnosis of SARS-CoV-2 by FDA under an Emergency Use Authorization (EUA). This EUA will remain  in effect (meaning this test can be used) for the duration of the COVID-19 declaration under Section 564(b)(1) of the Act, 21 U.S.C.section 360bbb-3(b)(1), unless the authorization is terminated  or revoked sooner.       Influenza A by PCR NEGATIVE NEGATIVE Final   Influenza B by PCR NEGATIVE NEGATIVE Final    Comment: (NOTE) The Xpert Xpress SARS-CoV-2/FLU/RSV plus assay is intended as an aid in the diagnosis of influenza from Nasopharyngeal swab specimens and should not be used as a sole basis for treatment. Nasal washings and aspirates are unacceptable for Xpert Xpress SARS-CoV-2/FLU/RSV testing.  Fact Sheet for Patients: EntrepreneurPulse.com.au  Fact Sheet for Healthcare  Providers: IncredibleEmployment.be  This test is not yet approved or cleared by the [place] FDA and has been authorized for detection and/or diagnosis of SARS-CoV-2 by FDA under an Emergency Use Authorization (EUA). This EUA will remain in effect (meaning this test can be used) for the duration of the COVID-19 declaration under Section 564(b)(1) of the Act, 21 U.S.C. section 360bbb-3(b)(1), unless the authorization is terminated or revoked.  Performed at [facility], [place] [address], [place], [place] [address]   Body fluid culture w Gram Stain     Status: None   Collection Time: 12/04/20  2:58 PM   Specimen: Body Fluid  Result Value Ref Range Status   Specimen Description FLUID RIGHT PLEURAL  Final   Special Requests NONE  Final   Gram Stain   Final    FEW WBC PRESENT, PREDOMINANTLY MONONUCLEAR NO ORGANISMS SEEN    Culture   Final    NO GROWTH 3 DAYS Performed at [facility], [address], [place], [place] [address]    Report Status 12/08/2020 FINAL  Final     Time coordinating discharge: 35 minutes  SIGNED:   Cordelia Poche, MD [facility] 12/11/2020, 11:24 AM
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
## 2020-12-11 NOTE — Discharge Instructions (Signed)
Gary [name],  You were in the hospital because of trouble with breathing. This was secondary to too much fluid in/around your lungs from heart failure. You received a lot of lasix to get the fluid out, in addition to two thoracenteses. Recommendation is to resume your torsemide. You will need to follow-up with Dr. Valeta Harms to discuss further management of your lung mass. If you have recurrent trouble breathing, this could be related to accumulation of fluid and you may need to have a repeat thoracentesis; this can be discussed with your pulmonologist.

## 2020-12-11 NOTE — Plan of Care (Signed)

## 2020-12-11 NOTE — Progress Notes (Signed)
OT Cancellation & Discharge Note  Patient Details Name: Gary [name] MRN: [id number] DOB: April 22, 1951   Cancelled Treatment:    Reason Eval/Treat Not Completed: Patient declined. Wife present at bedside preparing for patient d/c. Both patient and spouse report no further needs from acute OT services. OT to sign off at this time. Patient to d/c home with wife and 24hr supervision/assist. Patient/family continue to decline HHOT at this time.   Gloris Manchester OTR/L Supplemental OT, Department of rehab services [phone]  Lionel Woodberry R H. 12/11/2020, 2:21 PM

## 2020-12-11 NOTE — Telephone Encounter (Signed)
Dr Valeta Harms please advise on the following My Chart message:   PO   Sildenqfil 20 mg     Gary [name], Gary L, DO 1 hour ago (3:17 PM)   PO   Gary [name] 04-25-2051 2w/28 is being discharged today. he will be out of his pulmonary meds friday. We were in hopes that we could get them at  discharge due to cost .. i guess its too late for that.. but be out friday .. please let me know what to do.   [name] (Tanav's) wife  Thank you

## 2020-12-12 ENCOUNTER — Inpatient Hospital Stay: Payer: Medicare Other | Admitting: Pulmonary Disease

## 2020-12-12 ENCOUNTER — Telehealth: Payer: Self-pay | Admitting: Internal Medicine

## 2020-12-12 MED ORDER — SILDENAFIL CITRATE 20 MG PO TABS: 40.0000 mg | ORAL_TABLET | Freq: Three times a day (TID) | ORAL | 0 refills | Status: DC

## 2020-12-12 MED ORDER — AMIODARONE HCL 200 MG PO TABS
200.0000 mg | ORAL_TABLET | Freq: Every day | ORAL | 0 refills | Status: DC
Start: 2020-12-12 — End: 2021-01-07

## 2020-12-12 MED ORDER — TORSEMIDE 20 MG PO TABS: 20.0000 mg | ORAL_TABLET | Freq: Every day | ORAL | 0 refills | Status: DC

## 2020-12-12 MED ORDER — APIXABAN 5 MG PO TABS
5.0000 mg | ORAL_TABLET | Freq: Two times a day (BID) | ORAL | 0 refills | Status: DC
Start: 2020-12-12 — End: 2021-01-07

## 2020-12-12 MED ORDER — DILTIAZEM HCL ER COATED BEADS 180 MG PO CP24: 180.0000 mg | ORAL_CAPSULE | Freq: Every day | ORAL | 0 refills | Status: DC

## 2020-12-12 MED ORDER — MIDODRINE HCL 10 MG PO TABS: 10.0000 mg | ORAL_TABLET | Freq: Three times a day (TID) | ORAL | 0 refills | Status: DC

## 2020-12-12 NOTE — Telephone Encounter (Signed)
Checked pt's chart and saw that pt has never been seen at [facility] office.  Pt does have an appointment scheduled with  Dr. Valeta Harms 01/04/21 for a hospital follow up.  Routing to Dr. Silas Flood and Dr. Valeta Harms as an Juluis Rainier as the mychart messages have been sent to both providers.   Please advise if you think we might should switch pt to Dr. Silas Flood instead of Dr. Valeta Harms or if the appt is okay to keep as is.

## 2020-12-12 NOTE — Telephone Encounter (Signed)
Pt's wife is returning call to [place] from earlier this morning. Please advise

## 2020-12-12 NOTE — Telephone Encounter (Signed)
Please see telephone encounter. Refills sent to pharmacy. Advised patient's wife to call back to office with any issues, questions, or concerns. Patient's wife  verbalized understanding.

## 2020-12-12 NOTE — Telephone Encounter (Signed)
Returned call to patients wife [name] (okay per patient). [name] states that patient is doing well and she had miscalculated patients weight yesterday and he is actually down 0.4 lbs today. Advised [name] of daily weight instructions and trends. [name] requested that patients refills be sent to [facility] as a 30 day supply and states that when patient sees Dr. Harl Bowie she will have them send to [facility] mail order pharmacy as 90 day supplies. Advised [name] that Refills for Torsemide 78m Daily, Midodrine 141mthree times daily, Diltiazem 18038maily (confirmed with patients wife), Eliquis 5mg41mD, and Amiodarone 200mg9mly has been sent to patient preferred pharmacy. Advised [name] to call back with any issues, questions, or concerns. [name] verbalized understanding.   AcharElouise Munroe You Just now (10:16 AM)     Please refill the following and call the patient to ensure symptoms are stable from yesterday.   Please refill the following:  Cartia xt 180 mg.  Eliquis 5mg  35modrine 10 mg. Torsemide 20 mg  Amiodarone 200 mg Atorvastin 40 mg

## 2020-12-12 NOTE — Telephone Encounter (Signed)
Candee Furbish, MD  P [facility] Triage Pool UI:[id number], Octavio Graves, DO Patient being Dc'd today  Put in for a CT next week and can he see Icard after to discuss timing of bronchoscopy?  Will need eliquis washout  Thanks  Dan     CT order was placed by Dr. Carlis Abbott as pt was in the hospital. Pt did have a CT scheduled 12/27/20 but that CT was cancelled with a comment stating that the provider wanted to have the CT sooner.    Pt is scheduled for a follow up appointment with Dr. Valeta Harms 01/04/21.  P[facility]S, please advise about seeing if we can get pt scheduled for a CT next week per Dr. Tamala Julian.

## 2020-12-12 NOTE — Telephone Encounter (Signed)
Reviewed chart. Seems PH medications were managed by the Cardiology service during previous hospitalization. I am happy to see for [facility] - next available must be within the next 4 weeks. I will provide 1 month refill. Needs to see me in next 4 weeks.  Will message heart failure team regarding follow up.

## 2020-12-12 NOTE — Telephone Encounter (Signed)
  Gary Munroe, MD  You Just now (10:16 AM)     Please refill the following and call the patient to ensure symptoms are stable from yesterday.   Please refill the following:  Cartia xt 180 mg.  Eliquis 35m  midodrine 10 mg. Torsemide 20 mg  Amiodarone 200 mg Atorvastin 40 mg    Attempted to call patient and patients wife to confirm pharmacy for patients refills. Left message to return call to office when able, will send MyChart message as well.

## 2020-12-12 NOTE — Telephone Encounter (Signed)
Happy to see him as next available consult. The refill request should be forwarded to the previous prescribing provider as I have never met the patient and have no clinical relationship with him. If the previous provider can not refill the medicine let me know.

## 2020-12-13 NOTE — Telephone Encounter (Signed)
Pt sched for soonest appt at [facility] for 8/15 at 12:00pm before f/u w/ Dr. Valeta Harms on 8/19.

## 2020-12-13 NOTE — Telephone Encounter (Signed)
Dr. Valeta Harms, Please see patient message.

## 2020-12-17 ENCOUNTER — Other Ambulatory Visit: Payer: Self-pay | Admitting: *Deleted

## 2020-12-17 NOTE — Patient Outreach (Signed)
[facility]) Care Management  12/17/2020  Gary [name] [facility] [birth year] [id number]   Transition of Care-Unsuccessful #1  RN attempted outreach today however unsuccessful. RN able to leave a HIPAA approved voice message requesting a call back.  Will sen outreach letter and attempt another outreach over the next week.  Raina Mina, RN Care Management Coordinator [place] Office [phone]

## 2020-12-17 NOTE — Telephone Encounter (Signed)
Pt encouraged to call office to set up consult with Dr. Silas Flood. Nothing further needed at this point.

## 2020-12-21 ENCOUNTER — Other Ambulatory Visit: Payer: Self-pay | Admitting: *Deleted

## 2020-12-21 NOTE — Patient Outreach (Signed)
[facility]) Care Management  12/21/2020  Meiko Stranahan [facility] 12-Mar-1951 [id number]  Telephone Assessment-Unsuccessful Transition of care completed by the provider Outreach #2  RN attempted outreach call today however unsuccessful. RN able to leave a HIPAA approved voice message requesting a call back.  Will continue follow ups over the next week for pending services.  Raina Mina, RN Care Management Coordinator [place] Office [phone]

## 2020-12-25 ENCOUNTER — Ambulatory Visit (INDEPENDENT_AMBULATORY_CARE_PROVIDER_SITE_OTHER): Payer: Medicare Other | Admitting: Cardiology

## 2020-12-25 ENCOUNTER — Encounter: Payer: Self-pay | Admitting: Cardiology

## 2020-12-25 ENCOUNTER — Other Ambulatory Visit: Payer: Self-pay

## 2020-12-25 VITALS — BP 98/58 | HR 65 | Ht 67.0 in | Wt 169.2 lb

## 2020-12-25 DIAGNOSIS — I2510 Atherosclerotic heart disease of native coronary artery without angina pectoris: Secondary | ICD-10-CM | POA: Diagnosis not present

## 2020-12-25 DIAGNOSIS — I272 Pulmonary hypertension, unspecified: Secondary | ICD-10-CM

## 2020-12-25 DIAGNOSIS — I509 Heart failure, unspecified: Secondary | ICD-10-CM | POA: Diagnosis not present

## 2020-12-25 DIAGNOSIS — I2781 Cor pulmonale (chronic): Secondary | ICD-10-CM

## 2020-12-25 DIAGNOSIS — R0902 Hypoxemia: Secondary | ICD-10-CM | POA: Diagnosis not present

## 2020-12-25 DIAGNOSIS — J9610 Chronic respiratory failure, unspecified whether with hypoxia or hypercapnia: Secondary | ICD-10-CM | POA: Diagnosis not present

## 2020-12-25 DIAGNOSIS — R5381 Other malaise: Secondary | ICD-10-CM | POA: Diagnosis not present

## 2020-12-25 DIAGNOSIS — I4891 Unspecified atrial fibrillation: Secondary | ICD-10-CM | POA: Diagnosis not present

## 2020-12-25 DIAGNOSIS — M6281 Muscle weakness (generalized): Secondary | ICD-10-CM | POA: Diagnosis not present
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
## 2020-12-25 NOTE — Patient Instructions (Addendum)
Medication Instructions:  Stop Aspirin. Continue all other medications.     Labwork: BMET, Mg - orders given today.  Can be done at [facility] on same day as his test.  Office will contact with results via phone or letter.    Testing/Procedures: none  Follow-Up: 4 months   Any Other Special Instructions Will Be Listed Below (If Applicable). You have been referred to:  heart failure clinic   If you need a refill on your cardiac medications before your next appointment, please call your pharmacy.

## 2020-12-27 ENCOUNTER — Other Ambulatory Visit: Payer: Self-pay | Admitting: *Deleted

## 2020-12-27 ENCOUNTER — Other Ambulatory Visit (HOSPITAL_COMMUNITY): Payer: Medicare Other

## 2020-12-27 NOTE — Patient Outreach (Signed)
[facility]) Care Management  12/27/2020  Gary [name] [facility] [birth year] [id number]   Outreach attempt #3, unsuccessful, HIPAA compliant voice message left. Will update assigned RNCM to follow up within the next 3-4 weeks.  Valente David, [place], MSN [place] [phone]

## 2020-12-28 DIAGNOSIS — R5381 Other malaise: Secondary | ICD-10-CM | POA: Diagnosis not present

## 2020-12-28 DIAGNOSIS — M6281 Muscle weakness (generalized): Secondary | ICD-10-CM | POA: Diagnosis not present

## 2020-12-28 DIAGNOSIS — J9610 Chronic respiratory failure, unspecified whether with hypoxia or hypercapnia: Secondary | ICD-10-CM | POA: Diagnosis not present

## 2020-12-28 DIAGNOSIS — I509 Heart failure, unspecified: Secondary | ICD-10-CM | POA: Diagnosis not present

## 2020-12-28 DIAGNOSIS — R0902 Hypoxemia: Secondary | ICD-10-CM | POA: Diagnosis not present

## 2020-12-28 MED ORDER — INCRUSE ELLIPTA 62.5 MCG/INH IN AEPB: 1.0000 | INHALATION_SPRAY | Freq: Every day | RESPIRATORY_TRACT | 0 refills | Status: DC

## 2020-12-28 NOTE — Telephone Encounter (Signed)
Yes of course, make sure he keeps ov next week with icard

## 2020-12-28 NOTE — Telephone Encounter (Signed)
Pt sent a mychart message stating that he needs to have his Incruse Ellipta inhaler refilled. Pt has not yet been seen at the office as a pt but pt was seen in the hospital by our staff.  Pt does have an upcoming appt scheduled with Dr.Icard 8/19.  Pt said that he is completely out of his Incruse.  Tammy, please advise if you are okay with [place] sending a refill to the pharmacy for pt.

## 2020-12-31 ENCOUNTER — Other Ambulatory Visit (HOSPITAL_COMMUNITY)
Admission: RE | Admit: 2020-12-31 | Discharge: 2020-12-31 | Disposition: A | Discharge status: Home / Self Care | Payer: Medicare Other | Source: Ambulatory Visit | Attending: Cardiology | Admitting: Cardiology

## 2020-12-31 ENCOUNTER — Other Ambulatory Visit: Payer: Self-pay

## 2020-12-31 ENCOUNTER — Ambulatory Visit (HOSPITAL_COMMUNITY)
Admission: RE | Admit: 2020-12-31 | Discharge: 2020-12-31 | Disposition: A | Discharge status: Home / Self Care | Payer: Medicare Other | Source: Ambulatory Visit | Attending: Internal Medicine | Admitting: Internal Medicine

## 2020-12-31 DIAGNOSIS — I2781 Cor pulmonale (chronic): Secondary | ICD-10-CM | POA: Diagnosis not present

## 2020-12-31 DIAGNOSIS — R918 Other nonspecific abnormal finding of lung field: Secondary | ICD-10-CM | POA: Diagnosis not present

## 2020-12-31 DIAGNOSIS — R590 Localized enlarged lymph nodes: Secondary | ICD-10-CM

## 2020-12-31 DIAGNOSIS — J90 Pleural effusion, not elsewhere classified: Secondary | ICD-10-CM | POA: Diagnosis not present

## 2020-12-31 DIAGNOSIS — I700 Atherosclerosis of aorta: Secondary | ICD-10-CM | POA: Diagnosis not present

## 2020-12-31 DIAGNOSIS — I50813 Acute on chronic right heart failure: Secondary | ICD-10-CM | POA: Insufficient documentation

## 2020-12-31 LAB — BASIC METABOLIC PANEL
Anion gap: 14 (ref 5–15)
BUN: 27 mg/dL — ABNORMAL HIGH (ref 8–23)
CO2: 26 mmol/L (ref 22–32)
Calcium: 8.8 mg/dL — ABNORMAL LOW (ref 8.9–10.3)
Chloride: 92 mmol/L — ABNORMAL LOW (ref 98–111)
Creatinine, Ser: 0.98 mg/dL (ref 0.61–1.24)
GFR, Estimated: >60 mL/min (ref >60)
Glucose, Bld: 111 mg/dL — ABNORMAL HIGH (ref 70–99)
Potassium: 3.7 mmol/L (ref 3.5–5.1)
Sodium: 132 mmol/L — ABNORMAL LOW (ref 135–145)

## 2020-12-31 LAB — MAGNESIUM: Magnesium: 1.6 mg/dL — ABNORMAL LOW (ref 1.7–2.4)

## 2020-12-31 MED ORDER — IOHEXOL 350 MG/ML SOLN
60.0000 mL | Freq: Once | INTRAVENOUS | Status: AC | PRN
  Administered 2020-12-31: 60 mL via INTRAVENOUS

## 2021-01-01 ENCOUNTER — Encounter: Payer: Self-pay | Admitting: *Deleted

## 2021-01-01 ENCOUNTER — Telehealth: Payer: Self-pay | Admitting: *Deleted

## 2021-01-01 NOTE — Telephone Encounter (Signed)
Laurine Blazer, LPN  2/90/9030  1:49 PM EDT Back to Top    Wife ([name]) notified.  Copy to pcp.

## 2021-01-01 NOTE — Telephone Encounter (Signed)
-----   Message from Arnoldo Lenis, MD sent at 12/31/2020  3:09 PM EDT ----- Labs overeall look fine. Magnesium justly slightly low, verify he is taking his magnesium gluconate twice a day. He is on a fairly good dose and Mg not low enough to increase at this time   J BrancH MD

## 2021-01-01 NOTE — Telephone Encounter (Signed)
This encounter was created in error - please disregard.

## 2021-01-04 ENCOUNTER — Encounter: Payer: Self-pay | Admitting: Pulmonary Disease

## 2021-01-04 ENCOUNTER — Other Ambulatory Visit: Payer: Self-pay

## 2021-01-04 ENCOUNTER — Ambulatory Visit (INDEPENDENT_AMBULATORY_CARE_PROVIDER_SITE_OTHER): Payer: Medicare Other | Admitting: Pulmonary Disease

## 2021-01-04 VITALS — BP 118/70 | HR 75 | Ht 67.0 in | Wt 169.5 lb

## 2021-01-04 DIAGNOSIS — I2781 Cor pulmonale (chronic): Secondary | ICD-10-CM

## 2021-01-04 DIAGNOSIS — Z9889 Other specified postprocedural states: Secondary | ICD-10-CM

## 2021-01-04 DIAGNOSIS — J9621 Acute and chronic respiratory failure with hypoxia: Secondary | ICD-10-CM

## 2021-01-04 DIAGNOSIS — J449 Chronic obstructive pulmonary disease, unspecified: Secondary | ICD-10-CM | POA: Diagnosis not present

## 2021-01-04 DIAGNOSIS — I50813 Acute on chronic right heart failure: Secondary | ICD-10-CM

## 2021-01-04 DIAGNOSIS — R0902 Hypoxemia: Secondary | ICD-10-CM

## 2021-01-04 DIAGNOSIS — J90 Pleural effusion, not elsewhere classified: Secondary | ICD-10-CM
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
## 2021-01-04 NOTE — Patient Instructions (Signed)
Thank you for visiting Dr. Valeta Harms at [facility]. Today we recommend the following:  Walk for POC   Return in about 3 months (around 04/06/2021) for with APP or Dr. Valeta Harms.    Please do your part to reduce the spread of COVID-19.

## 2021-01-07 ENCOUNTER — Other Ambulatory Visit: Payer: Self-pay | Admitting: *Deleted

## 2021-01-07 MED ORDER — APIXABAN 5 MG PO TABS: 5.0000 mg | ORAL_TABLET | Freq: Two times a day (BID) | ORAL | 3 refills | Status: DC

## 2021-01-07 MED ORDER — SILDENAFIL CITRATE 20 MG PO TABS: 40.0000 mg | ORAL_TABLET | Freq: Three times a day (TID) | ORAL | 1 refills | Status: AC

## 2021-01-07 MED ORDER — INCRUSE ELLIPTA 62.5 MCG/INH IN AEPB: 1.0000 | INHALATION_SPRAY | Freq: Every day | RESPIRATORY_TRACT | 1 refills | Status: DC

## 2021-01-07 MED ORDER — MIDODRINE HCL 10 MG PO TABS: 10.0000 mg | ORAL_TABLET | Freq: Three times a day (TID) | ORAL | 3 refills | Status: DC

## 2021-01-07 MED ORDER — FLUTICASONE FUROATE-VILANTEROL 200-25 MCG/INH IN AEPB: 1.0000 | INHALATION_SPRAY | Freq: Every day | RESPIRATORY_TRACT | 1 refills | Status: DC

## 2021-01-07 MED ORDER — DILTIAZEM HCL ER COATED BEADS 180 MG PO CP24: 180.0000 mg | ORAL_CAPSULE | Freq: Every day | ORAL | 3 refills | Status: DC

## 2021-01-07 MED ORDER — TORSEMIDE 20 MG PO TABS: 20.0000 mg | ORAL_TABLET | Freq: Every day | ORAL | 3 refills | Status: DC

## 2021-01-07 MED ORDER — AMIODARONE HCL 200 MG PO TABS: 200.0000 mg | ORAL_TABLET | Freq: Every day | ORAL | 3 refills | Status: DC

## 2021-01-10 ENCOUNTER — Encounter (HOSPITAL_COMMUNITY): Payer: Self-pay | Admitting: Internal Medicine

## 2021-01-10 ENCOUNTER — Emergency Department (HOSPITAL_COMMUNITY): Payer: Medicare Other

## 2021-01-10 ENCOUNTER — Inpatient Hospital Stay (HOSPITAL_COMMUNITY)
Admission: EM | Admit: 2021-01-10 | Discharge: 2021-01-12 | DRG: 385 | Disposition: A | Discharge status: Home / Self Care | Payer: Medicare Other | Attending: Internal Medicine | Admitting: Internal Medicine

## 2021-01-10 ENCOUNTER — Encounter (HOSPITAL_COMMUNITY): Payer: Medicare Other | Admitting: Internal Medicine

## 2021-01-10 ENCOUNTER — Other Ambulatory Visit: Payer: Self-pay

## 2021-01-10 DIAGNOSIS — J9611 Chronic respiratory failure with hypoxia: Secondary | ICD-10-CM | POA: Diagnosis present

## 2021-01-10 DIAGNOSIS — I5033 Acute on chronic diastolic (congestive) heart failure: Secondary | ICD-10-CM | POA: Diagnosis present

## 2021-01-10 DIAGNOSIS — D509 Iron deficiency anemia, unspecified: Secondary | ICD-10-CM | POA: Diagnosis present

## 2021-01-10 DIAGNOSIS — I2729 Other secondary pulmonary hypertension: Secondary | ICD-10-CM | POA: Diagnosis present

## 2021-01-10 DIAGNOSIS — Z7901 Long term (current) use of anticoagulants: Secondary | ICD-10-CM | POA: Diagnosis not present

## 2021-01-10 DIAGNOSIS — J9621 Acute and chronic respiratory failure with hypoxia: Secondary | ICD-10-CM | POA: Diagnosis not present

## 2021-01-10 DIAGNOSIS — I482 Chronic atrial fibrillation, unspecified: Secondary | ICD-10-CM | POA: Diagnosis present

## 2021-01-10 DIAGNOSIS — Z87891 Personal history of nicotine dependence: Secondary | ICD-10-CM

## 2021-01-10 DIAGNOSIS — J9610 Chronic respiratory failure, unspecified whether with hypoxia or hypercapnia: Secondary | ICD-10-CM | POA: Diagnosis not present

## 2021-01-10 DIAGNOSIS — K50012 Crohn's disease of small intestine with intestinal obstruction: Secondary | ICD-10-CM | POA: Diagnosis present

## 2021-01-10 DIAGNOSIS — J449 Chronic obstructive pulmonary disease, unspecified: Secondary | ICD-10-CM | POA: Diagnosis present

## 2021-01-10 DIAGNOSIS — Z20822 Contact with and (suspected) exposure to covid-19: Secondary | ICD-10-CM | POA: Diagnosis present

## 2021-01-10 DIAGNOSIS — K50019 Crohn's disease of small intestine with unspecified complications: Secondary | ICD-10-CM | POA: Diagnosis not present

## 2021-01-10 DIAGNOSIS — E785 Hyperlipidemia, unspecified: Secondary | ICD-10-CM | POA: Diagnosis present

## 2021-01-10 DIAGNOSIS — D72829 Elevated white blood cell count, unspecified: Secondary | ICD-10-CM | POA: Diagnosis present

## 2021-01-10 DIAGNOSIS — K5000 Crohn's disease of small intestine without complications: Secondary | ICD-10-CM | POA: Diagnosis present

## 2021-01-10 DIAGNOSIS — I50813 Acute on chronic right heart failure: Secondary | ICD-10-CM | POA: Diagnosis not present

## 2021-01-10 DIAGNOSIS — K56609 Unspecified intestinal obstruction, unspecified as to partial versus complete obstruction: Secondary | ICD-10-CM | POA: Diagnosis not present

## 2021-01-10 DIAGNOSIS — I10 Essential (primary) hypertension: Secondary | ICD-10-CM | POA: Diagnosis not present

## 2021-01-10 DIAGNOSIS — E871 Hypo-osmolality and hyponatremia: Secondary | ICD-10-CM | POA: Diagnosis present

## 2021-01-10 DIAGNOSIS — G4733 Obstructive sleep apnea (adult) (pediatric): Secondary | ICD-10-CM | POA: Diagnosis present

## 2021-01-10 DIAGNOSIS — I110 Hypertensive heart disease with heart failure: Secondary | ICD-10-CM | POA: Diagnosis present

## 2021-01-10 DIAGNOSIS — E1169 Type 2 diabetes mellitus with other specified complication: Secondary | ICD-10-CM | POA: Diagnosis present

## 2021-01-10 DIAGNOSIS — R918 Other nonspecific abnormal finding of lung field: Secondary | ICD-10-CM | POA: Diagnosis not present

## 2021-01-10 DIAGNOSIS — R109 Unspecified abdominal pain: Secondary | ICD-10-CM | POA: Diagnosis not present

## 2021-01-10 DIAGNOSIS — K439 Ventral hernia without obstruction or gangrene: Secondary | ICD-10-CM | POA: Diagnosis present

## 2021-01-10 DIAGNOSIS — N3289 Other specified disorders of bladder: Secondary | ICD-10-CM | POA: Diagnosis not present

## 2021-01-10 DIAGNOSIS — D638 Anemia in other chronic diseases classified elsewhere: Secondary | ICD-10-CM | POA: Diagnosis present

## 2021-01-10 DIAGNOSIS — Z7984 Long term (current) use of oral hypoglycemic drugs: Secondary | ICD-10-CM | POA: Diagnosis not present

## 2021-01-10 DIAGNOSIS — Z7951 Long term (current) use of inhaled steroids: Secondary | ICD-10-CM | POA: Diagnosis not present

## 2021-01-10 DIAGNOSIS — Z833 Family history of diabetes mellitus: Secondary | ICD-10-CM

## 2021-01-10 DIAGNOSIS — I5082 Biventricular heart failure: Secondary | ICD-10-CM | POA: Diagnosis present

## 2021-01-10 DIAGNOSIS — Z79899 Other long term (current) drug therapy: Secondary | ICD-10-CM

## 2021-01-10 DIAGNOSIS — E1159 Type 2 diabetes mellitus with other circulatory complications: Secondary | ICD-10-CM | POA: Diagnosis not present

## 2021-01-10 DIAGNOSIS — E7849 Other hyperlipidemia: Secondary | ICD-10-CM | POA: Diagnosis not present

## 2021-01-10 DIAGNOSIS — I2781 Cor pulmonale (chronic): Secondary | ICD-10-CM | POA: Diagnosis present

## 2021-01-10 DIAGNOSIS — Z9889 Other specified postprocedural states: Secondary | ICD-10-CM | POA: Diagnosis not present

## 2021-01-10 DIAGNOSIS — K429 Umbilical hernia without obstruction or gangrene: Secondary | ICD-10-CM | POA: Diagnosis present

## 2021-01-10 DIAGNOSIS — K219 Gastro-esophageal reflux disease without esophagitis: Secondary | ICD-10-CM | POA: Diagnosis present

## 2021-01-10 DIAGNOSIS — J90 Pleural effusion, not elsewhere classified: Secondary | ICD-10-CM | POA: Diagnosis not present

## 2021-01-10 DIAGNOSIS — I5032 Chronic diastolic (congestive) heart failure: Secondary | ICD-10-CM | POA: Diagnosis present

## 2021-01-10 DIAGNOSIS — K529 Noninfective gastroenteritis and colitis, unspecified: Secondary | ICD-10-CM | POA: Diagnosis not present

## 2021-01-10 DIAGNOSIS — I4891 Unspecified atrial fibrillation: Secondary | ICD-10-CM | POA: Diagnosis not present

## 2021-01-10 DIAGNOSIS — I2510 Atherosclerotic heart disease of native coronary artery without angina pectoris: Secondary | ICD-10-CM | POA: Diagnosis present

## 2021-01-10 DIAGNOSIS — K5669 Other partial intestinal obstruction: Secondary | ICD-10-CM | POA: Diagnosis not present

## 2021-01-10 LAB — RESP PANEL BY RT-PCR (FLU A&B, COVID) ARPGX2
Influenza A by PCR: NEGATIVE
Influenza B by PCR: NEGATIVE
SARS Coronavirus 2 by RT PCR: NEGATIVE

## 2021-01-10 LAB — URINALYSIS, MICROSCOPIC (REFLEX): Bacteria, UA: NONE SEEN

## 2021-01-10 LAB — COMPREHENSIVE METABOLIC PANEL
ALT: 17 U/L (ref 0–44)
AST: 21 U/L (ref 15–41)
Albumin: 3.3 g/dL — ABNORMAL LOW (ref 3.5–5.0)
Alkaline Phosphatase: 66 U/L (ref 38–126)
Anion gap: 13 (ref 5–15)
BUN: 16 mg/dL (ref 8–23)
CO2: 25 mmol/L (ref 22–32)
Calcium: 9.2 mg/dL (ref 8.9–10.3)
Chloride: 95 mmol/L — ABNORMAL LOW (ref 98–111)
Creatinine, Ser: 1.07 mg/dL (ref 0.61–1.24)
GFR, Estimated: >60 mL/min (ref >60)
Glucose, Bld: 153 mg/dL — ABNORMAL HIGH (ref 70–99)
Potassium: 4.3 mmol/L (ref 3.5–5.1)
Sodium: 133 mmol/L — ABNORMAL LOW (ref 135–145)
Total Bilirubin: 0.8 mg/dL (ref 0.3–1.2)
Total Protein: 7.2 g/dL (ref 6.5–8.1)

## 2021-01-10 LAB — URINALYSIS, ROUTINE W REFLEX MICROSCOPIC
Bilirubin Urine: NEGATIVE
Glucose, UA: >=500 mg/dL — AB
Hgb urine dipstick: NEGATIVE
Ketones, ur: NEGATIVE mg/dL
Leukocytes,Ua: NEGATIVE
Nitrite: NEGATIVE
Protein, ur: NEGATIVE mg/dL
Specific Gravity, Urine: <1.005 — ABNORMAL LOW (ref 1.005–1.030)
pH: 7 (ref 5.0–8.0)

## 2021-01-10 LAB — CBC
HCT: 39.2 % (ref 39.0–52.0)
Hemoglobin: 11.4 g/dL — ABNORMAL LOW (ref 13.0–17.0)
MCH: 23.1 pg — ABNORMAL LOW (ref 26.0–34.0)
MCHC: 29.1 g/dL — ABNORMAL LOW (ref 30.0–36.0)
MCV: 79.4 fL — ABNORMAL LOW (ref 80.0–100.0)
Platelets: 425 10*3/uL — ABNORMAL HIGH (ref 150–400)
RBC: 4.94 MIL/uL (ref 4.22–5.81)
RDW: 21.6 % — ABNORMAL HIGH (ref 11.5–15.5)
WBC: 11.7 10*3/uL — ABNORMAL HIGH (ref 4.0–10.5)
nRBC: 0 % (ref 0.0–0.2)

## 2021-01-10 LAB — C-REACTIVE PROTEIN: CRP: 1.6 mg/dL — ABNORMAL HIGH (ref <1.0)

## 2021-01-10 LAB — HEPARIN LEVEL (UNFRACTIONATED): Heparin Unfractionated: >1.1 IU/mL — ABNORMAL HIGH (ref 0.30–0.70)

## 2021-01-10 LAB — SEDIMENTATION RATE: Sed Rate: 55 mm/hr — ABNORMAL HIGH (ref 0–16)

## 2021-01-10 LAB — LACTIC ACID, PLASMA
Lactic Acid, Venous: 1.2 mmol/L (ref 0.5–1.9)
Lactic Acid, Venous: 0.9 mmol/L (ref 0.5–1.9)

## 2021-01-10 LAB — PROTIME-INR
INR: 1.2 (ref 0.8–1.2)
Prothrombin Time: 15.1 seconds (ref 11.4–15.2)

## 2021-01-10 LAB — APTT: aPTT: 33 seconds (ref 24–36)

## 2021-01-10 LAB — LIPASE, BLOOD: Lipase: 33 U/L (ref 11–51)

## 2021-01-10 MED ORDER — LACTATED RINGERS IV SOLN: INTRAVENOUS | Status: DC

## 2021-01-10 MED ORDER — SODIUM CHLORIDE 0.9 % IV SOLN
2.0000 g | INTRAVENOUS | Status: DC
  Administered 2021-01-11: 2 g via INTRAVENOUS
  Filled 2021-01-10: qty 20

## 2021-01-10 MED ORDER — SODIUM CHLORIDE 0.9% FLUSH
3.0000 mL | Freq: Two times a day (BID) | INTRAVENOUS | Status: DC
  Administered 2021-01-11 – 2021-01-12 (×3): 3 mL via INTRAVENOUS

## 2021-01-10 MED ORDER — LORAZEPAM 2 MG/ML IJ SOLN: 0.5000 mg | Freq: Three times a day (TID) | INTRAMUSCULAR | Status: DC | PRN

## 2021-01-10 MED ORDER — UMECLIDINIUM BROMIDE 62.5 MCG/INH IN AEPB
1.0000 | INHALATION_SPRAY | Freq: Every day | RESPIRATORY_TRACT | Status: DC
  Administered 2021-01-11 – 2021-01-12 (×2): 1 via RESPIRATORY_TRACT
  Filled 2021-01-10: qty 7

## 2021-01-10 MED ORDER — METRONIDAZOLE 500 MG/100ML IV SOLN
500.0000 mg | Freq: Once | INTRAVENOUS | Status: AC
  Administered 2021-01-10: 500 mg via INTRAVENOUS
  Filled 2021-01-10: qty 100

## 2021-01-10 MED ORDER — ALBUTEROL SULFATE (2.5 MG/3ML) 0.083% IN NEBU: 2.5000 mg | INHALATION_SOLUTION | Freq: Four times a day (QID) | RESPIRATORY_TRACT | Status: DC | PRN

## 2021-01-10 MED ORDER — HEPARIN (PORCINE) 25000 UT/250ML-% IV SOLN
1800.0000 [IU]/h | INTRAVENOUS | Status: DC
  Administered 2021-01-10: 1200 [IU]/h via INTRAVENOUS
  Administered 2021-01-12: 1800 [IU]/h via INTRAVENOUS
  Filled 2021-01-10 (×3): qty 250

## 2021-01-10 MED ORDER — PANTOPRAZOLE SODIUM 40 MG IV SOLR
40.0000 mg | INTRAVENOUS | Status: DC
  Administered 2021-01-10 – 2021-01-11 (×2): 40 mg via INTRAVENOUS
  Filled 2021-01-10 (×2): qty 40

## 2021-01-10 MED ORDER — METRONIDAZOLE 500 MG/100ML IV SOLN
500.0000 mg | Freq: Three times a day (TID) | INTRAVENOUS | Status: DC
  Administered 2021-01-10 – 2021-01-12 (×5): 500 mg via INTRAVENOUS
  Filled 2021-01-10 (×5): qty 100

## 2021-01-10 MED ORDER — ACETAMINOPHEN 325 MG PO TABS: 650.0000 mg | ORAL_TABLET | Freq: Four times a day (QID) | ORAL | Status: DC | PRN

## 2021-01-10 MED ORDER — ACETAMINOPHEN 650 MG RE SUPP: 650.0000 mg | Freq: Four times a day (QID) | RECTAL | Status: DC | PRN

## 2021-01-10 MED ORDER — CEFTRIAXONE SODIUM 1 G IJ SOLR
1.0000 g | INTRAMUSCULAR | Status: AC
  Administered 2021-01-10: 1 g via INTRAVENOUS
  Filled 2021-01-10: qty 10

## 2021-01-10 MED ORDER — FENTANYL CITRATE PF 50 MCG/ML IJ SOSY
25.0000 ug | PREFILLED_SYRINGE | INTRAMUSCULAR | Status: DC | PRN
  Administered 2021-01-10 – 2021-01-11 (×3): 25 ug via INTRAVENOUS
  Filled 2021-01-10 (×3): qty 1

## 2021-01-10 MED ORDER — DIATRIZOATE MEGLUMINE & SODIUM 66-10 % PO SOLN
90.0000 mL | Freq: Once | ORAL | Status: AC
  Administered 2021-01-10: 90 mL via ORAL
  Filled 2021-01-10 (×3): qty 90

## 2021-01-10 MED ORDER — FLUTICASONE FUROATE-VILANTEROL 200-25 MCG/INH IN AEPB
1.0000 | INHALATION_SPRAY | Freq: Every day | RESPIRATORY_TRACT | Status: DC
  Administered 2021-01-11 – 2021-01-12 (×2): 1 via RESPIRATORY_TRACT
  Filled 2021-01-10: qty 28

## 2021-01-10 MED ORDER — SODIUM CHLORIDE 0.9 % IV SOLN: INTRAVENOUS | Status: DC

## 2021-01-10 MED ORDER — SALINE SPRAY 0.65 % NA SOLN
1.0000 | NASAL | Status: DC | PRN
  Filled 2021-01-10: qty 44

## 2021-01-10 MED ORDER — IOHEXOL 300 MG/ML  SOLN
100.0000 mL | Freq: Once | INTRAMUSCULAR | Status: AC | PRN
  Administered 2021-01-10: 100 mL via INTRAVENOUS

## 2021-01-10 MED ORDER — DILTIAZEM HCL 25 MG/5ML IV SOLN
5.0000 mg | Freq: Once | INTRAVENOUS | Status: DC | PRN
  Filled 2021-01-10: qty 5

## 2021-01-10 MED ORDER — FENTANYL CITRATE PF 50 MCG/ML IJ SOSY
50.0000 ug | PREFILLED_SYRINGE | Freq: Once | INTRAMUSCULAR | Status: AC
Start: 2021-01-10 — End: 2021-01-10
  Administered 2021-01-10: 50 ug via INTRAVENOUS
  Filled 2021-01-10: qty 1

## 2021-01-10 MED ORDER — SODIUM CHLORIDE 0.9 % IV SOLN
1.0000 g | Freq: Once | INTRAVENOUS | Status: AC
  Administered 2021-01-10: 1 g via INTRAVENOUS
  Filled 2021-01-10: qty 10
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
## 2021-01-10 NOTE — ED Triage Notes (Signed)
Patient here for evaluation of abdominal pain that started yesterday after lunch that has gotten progressively worse since onset. Patient denies nausea, vomiting, diarrhea.
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
## 2021-01-10 NOTE — Progress Notes (Signed)
01/07/2021 Patient transfer from the emergency room  to 2west. He is alert and oriented to person, place, time and situation. He was placed on the monitor when arrive to unit. Rico Sheehan RN

## 2021-01-11 ENCOUNTER — Inpatient Hospital Stay (HOSPITAL_COMMUNITY): Payer: Medicare Other

## 2021-01-11 LAB — CBC
HCT: 37.5 % — ABNORMAL LOW (ref 39.0–52.0)
Hemoglobin: 10.8 g/dL — ABNORMAL LOW (ref 13.0–17.0)
MCH: 22.7 pg — ABNORMAL LOW (ref 26.0–34.0)
MCHC: 28.8 g/dL — ABNORMAL LOW (ref 30.0–36.0)
MCV: 78.9 fL — ABNORMAL LOW (ref 80.0–100.0)
Platelets: 396 10*3/uL (ref 150–400)
RBC: 4.75 MIL/uL (ref 4.22–5.81)
RDW: 21.8 % — ABNORMAL HIGH (ref 11.5–15.5)
WBC: 10.8 10*3/uL — ABNORMAL HIGH (ref 4.0–10.5)
nRBC: 0 % (ref 0.0–0.2)

## 2021-01-11 LAB — HEMOGLOBIN A1C
Hgb A1c MFr Bld: 7.4 % — ABNORMAL HIGH (ref 4.8–5.6)
Mean Plasma Glucose: 165.68 mg/dL

## 2021-01-11 LAB — HEPARIN LEVEL (UNFRACTIONATED): Heparin Unfractionated: 1.01 IU/mL — ABNORMAL HIGH (ref 0.30–0.70)

## 2021-01-11 LAB — GLUCOSE, CAPILLARY
Glucose-Capillary: 94 mg/dL (ref 70–99)
Glucose-Capillary: 82 mg/dL (ref 70–99)
Glucose-Capillary: 79 mg/dL (ref 70–99)
Glucose-Capillary: 182 mg/dL — ABNORMAL HIGH (ref 70–99)

## 2021-01-11 LAB — BASIC METABOLIC PANEL
Anion gap: 14 (ref 5–15)
BUN: 15 mg/dL (ref 8–23)
CO2: 26 mmol/L (ref 22–32)
Calcium: 9 mg/dL (ref 8.9–10.3)
Chloride: 99 mmol/L (ref 98–111)
Creatinine, Ser: 0.97 mg/dL (ref 0.61–1.24)
GFR, Estimated: >60 mL/min (ref >60)
Glucose, Bld: 104 mg/dL — ABNORMAL HIGH (ref 70–99)
Potassium: 3.6 mmol/L (ref 3.5–5.1)
Sodium: 139 mmol/L (ref 135–145)

## 2021-01-11 LAB — APTT
aPTT: 34 seconds (ref 24–36)
aPTT: 41 seconds — ABNORMAL HIGH (ref 24–36)
aPTT: 53 seconds — ABNORMAL HIGH (ref 24–36)

## 2021-01-11 LAB — MAGNESIUM: Magnesium: 1.8 mg/dL (ref 1.7–2.4)

## 2021-01-11 MED ORDER — DILTIAZEM HCL ER COATED BEADS 180 MG PO CP24
180.0000 mg | ORAL_CAPSULE | Freq: Every day | ORAL | Status: DC
  Administered 2021-01-11 – 2021-01-12 (×2): 180 mg via ORAL
  Filled 2021-01-11 (×2): qty 1

## 2021-01-11 MED ORDER — MAGNESIUM SULFATE 2 GM/50ML IV SOLN
2.0000 g | Freq: Once | INTRAVENOUS | Status: AC
  Administered 2021-01-11: 2 g via INTRAVENOUS
  Filled 2021-01-11: qty 50

## 2021-01-11 MED ORDER — INSULIN ASPART 100 UNIT/ML IJ SOLN
0.0000 [IU] | Freq: Three times a day (TID) | INTRAMUSCULAR | Status: DC
  Administered 2021-01-12: 1 [IU] via SUBCUTANEOUS

## 2021-01-11 MED ORDER — SILDENAFIL CITRATE 20 MG PO TABS
40.0000 mg | ORAL_TABLET | Freq: Three times a day (TID) | ORAL | Status: DC
  Administered 2021-01-11 – 2021-01-12 (×3): 40 mg via ORAL
  Filled 2021-01-11 (×5): qty 2

## 2021-01-11 MED ORDER — ATORVASTATIN CALCIUM 40 MG PO TABS
40.0000 mg | ORAL_TABLET | Freq: Every evening | ORAL | Status: DC
  Administered 2021-01-11: 40 mg via ORAL
  Filled 2021-01-11: qty 1

## 2021-01-11 MED ORDER — BUSPIRONE HCL 15 MG PO TABS
7.5000 mg | ORAL_TABLET | Freq: Two times a day (BID) | ORAL | Status: DC
  Administered 2021-01-11 – 2021-01-12 (×3): 7.5 mg via ORAL
  Filled 2021-01-11 (×4): qty 1

## 2021-01-11 MED ORDER — POTASSIUM CHLORIDE 20 MEQ PO PACK
40.0000 meq | PACK | Freq: Once | ORAL | Status: AC
  Administered 2021-01-11: 40 meq via ORAL
  Filled 2021-01-11: qty 2

## 2021-01-11 MED ORDER — MIDODRINE HCL 5 MG PO TABS
10.0000 mg | ORAL_TABLET | Freq: Three times a day (TID) | ORAL | Status: DC
  Administered 2021-01-11 – 2021-01-12 (×2): 10 mg via ORAL
  Filled 2021-01-11 (×2): qty 2

## 2021-01-11 MED ORDER — EMPAGLIFLOZIN 10 MG PO TABS
10.0000 mg | ORAL_TABLET | Freq: Every day | ORAL | Status: DC
  Administered 2021-01-11 – 2021-01-12 (×2): 10 mg via ORAL
  Filled 2021-01-11 (×2): qty 1

## 2021-01-11 MED ORDER — TORSEMIDE 20 MG PO TABS
20.0000 mg | ORAL_TABLET | Freq: Every day | ORAL | Status: DC
  Administered 2021-01-11 – 2021-01-12 (×2): 20 mg via ORAL
  Filled 2021-01-11 (×2): qty 1

## 2021-01-11 MED ORDER — AMIODARONE HCL 200 MG PO TABS
200.0000 mg | ORAL_TABLET | Freq: Every day | ORAL | Status: DC
  Administered 2021-01-11 – 2021-01-12 (×2): 200 mg via ORAL
  Filled 2021-01-11 (×2): qty 1

## 2021-01-11 NOTE — Consult Note (Signed)
[facility] Inpatient Consult   01/11/2021  Gary [name] [facility] 02-Nov-1950 [id number]  [facility]  Accountable Care Organization [ACO] Patient: Gary [name]  Patient is currently pending for [facility] CM care coordination services. RN case manager has had 3 unsuccessful telephone attempts to reach patient and assess for needs. Per review, patient has high risk score for unplanned readmission.   Plan: Continue to follow for progression and disposition plans.   Of note, [facility] Care Management services does not replace or interfere with any services that are needed or arranged by inpatient case management or social work.    Netta Cedars, MSN, [facility] Liaison Nurse Mobile Phone [phone]  Toll free office [phone]

## 2021-01-11 NOTE — Progress Notes (Signed)
[facility] for Heparin Indication: atrial fibrillation  Allergies  Allergen Reactions   Novocain [Procaine] Other (See Comments)    Unknown reaction as a young child    Patient Measurements: Height: 5' 7"  (170.2 cm) Weight: 76.9 kg (169 lb 8.5 oz) IBW/kg (Calculated) : 66.1 Heparin Dosing Weight: 76.9 kg  Vital Signs: Temp: 98.1 F (36.7 C) (08/26 0804) Temp Source: Oral (08/26 0804) BP: 121/69 (08/26 1119) Pulse Rate: 82 (08/26 1119)  Labs: Recent Labs    01/10/21 0816 01/10/21 1436 01/11/21 0254 01/11/21 1310  HGB 11.4*  --  10.8*  --   HCT 39.2  --  37.5*  --   PLT 425*  --  396  --   APTT  --  33 34 41*  LABPROT  --  15.1  --   --   INR  --  1.2  --   --   HEPARINUNFRC  --  >1.10*  --  1.01*  CREATININE 1.07  --  0.97  --      Estimated Creatinine Clearance: 64.4 mL/min (by C-G formula based on SCr of 0.97 mg/dL).   Assessment: 19 yom presenting with abdominal pain. Patient with a history of atrial fibrillation on Eliquis, type 2 diabetes, hypertension. Patient recently admitted for respiratory failure and bilateral infiltrates and right-sided pleural effusion complicated by decompensated right ventricular failure from pulmonary hypertension.  Denies having any significant bleeding except for occasional nosebleeds but did have a GIB earlier this year.  Patient is on apixaban prior to arrival. Last dose 8/24 at 1800. Will require aPTT monitoring until heparin level correlates.  PTT this afternoon 41 seconds   Goal of Therapy:  Heparin level 0.3-0.7 units/ml aPTT 66-102 seconds Monitor platelets by anticoagulation protocol: Yes   Plan:  Inc heparin to 1600 units/hr PTT at 2300 pm Planning transition back to Eliquis 8/27 if progresses well  Thank you Anette Guarneri, PharmD

## 2021-01-11 NOTE — Progress Notes (Signed)
[facility] for Heparin Indication: atrial fibrillation  Allergies  Allergen Reactions   Novocain [Procaine] Other (See Comments)    Unknown reaction as a young child    Patient Measurements: Height: 5' 7"  (170.2 cm) Weight: 76.9 kg (169 lb 8.5 oz) IBW/kg (Calculated) : 66.1 Heparin Dosing Weight: 76.9 kg  Vital Signs: Temp: 98.2 F (36.8 C) (08/26 1932) Temp Source: Oral (08/26 1932) BP: 96/52 (08/26 1932) Pulse Rate: 67 (08/26 2338)  Labs: Recent Labs    01/10/21 0816 01/10/21 1436 01/10/21 1436 01/11/21 0254 01/11/21 1310 01/11/21 2250  HGB 11.4*  --   --  10.8*  --   --   HCT 39.2  --   --  37.5*  --   --   PLT 425*  --   --  396  --   --   APTT  --  33   < > 34 41* 53*  LABPROT  --  15.1  --   --   --   --   INR  --  1.2  --   --   --   --   HEPARINUNFRC  --  >1.10*  --   --  1.01*  --   CREATININE 1.07  --   --  0.97  --   --    < > = values in this interval not displayed.     Estimated Creatinine Clearance: 64.4 mL/min (by C-G formula based on SCr of 0.97 mg/dL).   Assessment: 70 y.o. male with h/o Afib, Eliquis on hold, for heparin  Goal of Therapy:  Heparin level 0.3-0.7 units/ml aPTT 66-102 seconds Monitor platelets by anticoagulation protocol: Yes   Plan:  Increase Heparin 1800 units/hr Follow-up am labs.  Phillis Knack, PharmD, BCPS

## 2021-01-11 NOTE — Progress Notes (Signed)
Subjective: CC: Doing well. Some upper abdominal soreness that is improved from yesterday. No n/v. Passing flatus. BM this morning. Contrast in colon on xray.   Objective: Vital signs in last 24 hours: Temp:  [98.1 F (36.7 C)-98.9 F (37.2 C)] 98.1 F (36.7 C) (08/26 0804) Pulse Rate:  [74-95] 91 (08/26 0804) Resp:  [16-27] 18 (08/26 0804) BP: (106-120)/(64-78) 113/64 (08/26 0804) SpO2:  [83 %-100 %] 85 % (08/26 0804) Weight:  [76.9 kg] 76.9 kg (08/25 1423) Last BM Date: 01/09/21  Intake/Output from previous day: 08/25 0701 - 08/26 0700 In: 945.5 [I.V.:845.4; IV Piggyback:100.1] Out: 175 [Urine:175] Intake/Output this shift: No intake/output data recorded.  PE: Gen:  Alert, NAD, pleasant Pulm:  Normal rate and effort on o2 Abd: Soft, mild distension, very mild generalized tenderness that is improved from yesterday, large ventral hernia that is partially reducible and without any overlying skin changes. + BS. No masses or organomegaly. Psych: A&Ox3  Skin: no rashes noted, warm and dry  Lab Results:  Recent Labs    01/10/21 0816 01/11/21 0254  WBC 11.7* 10.8*  HGB 11.4* 10.8*  HCT 39.2 37.5*  PLT 425* 396   BMET Recent Labs    01/10/21 0816 01/11/21 0254  NA 133* 139  K 4.3 3.6  CL 95* 99  CO2 25 26  GLUCOSE 153* 104*  BUN 16 15  CREATININE 1.07 0.97  CALCIUM 9.2 9.0   PT/INR Recent Labs    01/10/21 1436  LABPROT 15.1  INR 1.2   CMP     Component Value Date/Time   NA 139 01/11/2021 0254   K 3.6 01/11/2021 0254   CL 99 01/11/2021 0254   CO2 26 01/11/2021 0254   GLUCOSE 104 (H) 01/11/2021 0254   BUN 15 01/11/2021 0254   CREATININE 0.97 01/11/2021 0254   CALCIUM 9.0 01/11/2021 0254   CALCIUM 7.7 (L) 09/26/2020 0052   PROT 7.2 01/10/2021 0816   ALBUMIN 3.3 (L) 01/10/2021 0816   AST 21 01/10/2021 0816   ALT 17 01/10/2021 0816   ALKPHOS 66 01/10/2021 0816   BILITOT 0.8 01/10/2021 0816   GFRNONAA >60 01/11/2021 0254   GFRAA 80 (L)  05/14/2013 0835   Lipase     Component Value Date/Time   LIPASE 33 01/10/2021 0816    Studies/Results: CT ABDOMEN PELVIS W CONTRAST  Result Date: 01/10/2021 CLINICAL DATA:  Hernia EXAM: CT ABDOMEN AND PELVIS WITH CONTRAST TECHNIQUE: Multidetector CT imaging of the abdomen and pelvis was performed using the standard protocol following bolus administration of intravenous contrast. CONTRAST:  125m OMNIPAQUE IOHEXOL 300 MG/ML  SOLN COMPARISON:  CT abdomen and pelvis dated September 06, 2020 FINDINGS: Lower chest: Partially visualized moderate loculated right pleural effusion. No pericardial effusion. Hepatobiliary: No focal liver abnormality is seen. No gallstones, gallbladder wall thickening, or biliary dilatation. Pancreas: Chest tube Spleen: Normal in size without focal abnormality. Adrenals/Urinary Tract: Adrenal glands are unremarkable. Kidneys are normal, without renal calculi, focal lesion, or hydronephrosis. Bladder is unremarkable. Stomach/Bowel: Wall thickening of the terminal ileum with multiple upstream dilated loops of distal bowel. Small bowel feces sign, indicating slow transit. Normal appendix. No evidence of pneumatosis or bowel wall hypoenhancement. Vascular/Lymphatic: Aortic atherosclerosis. No enlarged abdominal or pelvic lymph nodes. Reproductive: Prostate is unremarkable. Other: Large fat containing umbilical hernia with associated calcification. No free intraperitoneal fluid or air. Musculoskeletal: No acute or significant osseous findings. ORIF of previous sacroiliac diastasis. IMPRESSION: Short-segment terminal ileitis causing upstream small bowel obstruction.  Large fat containing umbilical hernia. Partially visualized moderate loculated right pleural effusion. Electronically Signed   By: Yetta Glassman M.D.   On: 01/10/2021 12:54   DG Abd Portable 1V-Small Bowel Obstruction Protocol-initial, 8 hr delay  Result Date: 01/11/2021 CLINICAL DATA:  8 hour follow-up small-bowel  obstruction EXAM: PORTABLE ABDOMEN - 1 VIEW COMPARISON:  CT from the previous day. FINDINGS: Administered contrast material now lies scattered throughout the small bowel and colon indicating a partial small bowel obstruction. Some residual small bowel dilatation is seen. Postsurgical changes are noted in the sacrum. Contrast distends the urinary bladder. IMPRESSION: Persistent small bowel dilatation although contrast material passes into the colon consistent with a partial small bowel obstruction. Electronically Signed   By: Inez Catalina M.D.   On: 01/11/2021 03:14    Anti-infectives: Anti-infectives (From admission, onward)    Start     Dose/Rate Route Frequency Ordered Stop   01/11/21 1400  cefTRIAXone (ROCEPHIN) 2 g in sodium chloride 0.9 % 100 mL IVPB        2 g 200 mL/hr over 30 Minutes Intravenous Every 24 hours 01/10/21 1736     01/10/21 2200  metroNIDAZOLE (FLAGYL) IVPB 500 mg        500 mg 100 mL/hr over 60 Minutes Intravenous Every 8 hours 01/10/21 1711     01/10/21 1800  cefTRIAXone (ROCEPHIN) 1 g in sodium chloride 0.9 % 100 mL IVPB        1 g 200 mL/hr over 30 Minutes Intravenous Every 24 hours 01/10/21 1711 01/10/21 1838   01/10/21 1330  cefTRIAXone (ROCEPHIN) 1 g in sodium chloride 0.9 % 100 mL IVPB        1 g 200 mL/hr over 30 Minutes Intravenous  Once 01/10/21 1321 01/10/21 1523   01/10/21 1330  metroNIDAZOLE (FLAGYL) IVPB 500 mg        500 mg 100 mL/hr over 60 Minutes Intravenous  Once 01/10/21 1321 01/10/21 1524        Assessment/Plan SBO Terminal ileitis on CT - Unclear what is causing the patients terminal ileitis picture on CT. He denies diarrhea. No hx of IBD. He is unsure what his last colonoscopy showed [place] TRH has started abx.  - Contrast in colon on xray and had a BM this am. Start CLD. Adv as tolerated. - Keep K > 4 and Mg > 2 for bowel function - Mobilize for bowel function - We will follow with you     Ventral Hernia - this has been demonstrated on prior CT scans. CT scan today w/ large fat containing umbilical hernia. No overlying skin changes. Evaluated with attending. No indication for surgery at this time.    FEN - CLD > ADAT to FLD, IVF per primary  VTE - SCDs, heparin gtt ID - On Rocephin/Flagyl    HTN HLD A. Fib on Eliquis at home CAD DM2 GERD and prior gastric ulcer 2/2 H. Pylori OSA Pulm HTN Chronic respiratory failure with known complex R sided pleural effusion followed by [facility]    LOS: 1 day    Jillyn Ledger , [facility] 01/11/2021, 9:33 AM Please see Amion for pager number during day hours 7:00am-4:30pm

## 2021-01-11 NOTE — Progress Notes (Addendum)
PROGRESS NOTE   Gary [name]  WLS:[id number]    DOB: 1950/07/02    DOA: 01/10/2021  PCP: Jalene Mullet, PA-C   I have briefly reviewed patients previous medical records in [facility]  Chief Complaint  Patient presents with   Abdominal Pain    Brief Narrative:  70 year old married male, independent with medical history significant for hypertension, atrial fibrillation on Eliquis, hyperlipidemia, carotid artery disease, type 2 diabetes mellitus, abdominal wall hernia, chronic respiratory failure with hypoxia on 3-5 L/min home oxygen, duodenal ulcer by EGD 08/2020 with GI bleed and positive H. pylori, presented with complaints of abdominal pain that started the afternoon prior to admission after eating lunch, with associated nausea and belching without vomiting and no flatus or BM since that day.  CT abdomen and pelvis showed short segment terminal ileitis causing upstream bowel obstruction and large fat-containing umbilical hernia and moderate loculated right pleural effusion.  Admitted for small bowel obstruction.  General surgery consulted and managing conservatively   Assessment & Plan:  Principal Problem:   Small bowel obstruction (HCC) Active Problems:   Atrial fibrillation, chronic (HCC)   Acute on chronic right heart failure (HCC)   Leukocytosis   Chronic respiratory failure (HCC)   SBO (small bowel obstruction) (HCC)   Ileitis, terminal (HCC)   Microcytic anemia   Small bowel obstruction secondary to terminal ileitis: - Fairly acute onset of abdominal pain and inability to pass flatus or BM. - CT abdomen and pelvis 8/25: Short segment terminal ileitis causing upstream small bowel obstruction - Managing conservatively with bowel rest, IV fluids, SBO protocol, pain management and mobilization.  Has not required NG tube. -Had a BM at around 3 AM today with prompt resolution of abdominal pain.  KUB shows persistent SBO although contrast material passes into the colon  consistent with partial SBO. -Starting clear liquid diet and advance diet as tolerated. -Attempt to keep potassium >4 and magnesium >2  Terminal ileitis by CT: - Seen on CT.  Unclear etiology.  No diarrhea or prior history of IBD. - [place] and [place] GI MD has since retired. - Empirically started on IV antibiotics, ceftriaxone and metronidazole on admission, continue for now, consider transitioning to oral Augmentin at discharge. - Consider outpatient GI consultation and follow-up.  Admitting MD discussed with GI when inflammatory bowel disease felt less likely given age and no prior history of IBD.  Large ventral hernia - Large fat-containing umbilical hernia noted on CT.  No complicating features - Per general surgery, no indications for surgery at this time  Essential hypertension - Controlled.  Resume home dose of Cardizem CD.  Hyperlipidemia  Chronic A. fib - Home oral meds (Eliquis, amiodarone and Cardizem) were held while he was n.p.o.  Patient was placed on IV heparin bridge. - Resume home amiodarone and Cardizem but continue IV heparin with plans to transition to home Eliquis tomorrow if he continues to progress well.  CAD -No anginal symptoms  Chronic right-sided heart failure/chronic cor pulmonale: - Last echo showed LVEF 55-60% and moderately elevated pulmonary artery systolic pressure of 81.1. - Clinically compensated.  Resume home dose of Lasix.  Type II DM - Last A1c was 6.7 in April 22 suggesting good control. - Hold home oral medications and start NovoLog SSI.  Resumed Jardiance.  GERD/prior duodenal ulcer by EGD with GI bleed and positive H. pylori - Asymptomatic at this time.  Continue IV Protonix.  Complex loculated  right-sided pleural effusion, recurrent - Followed as outpatient by Dr. Valeta Harms, PCCM.  Had last thoracentesis in on July 23.  Has had recurrent thoracentesis.  Outpatient follow-up.  OSA with  pulmonary hypertension/Chronic respiratory failure with hypoxia/COPD without exacerbation - Continue CPAP and oxygen support.  Continue sildenafil.  Anemia, chronic:  Possibly of chronic disease.  Stable.  Body mass index is 26.55 kg/m.   DVT prophylaxis:   Currently on IV heparin infusion   Code Status: Full Code Family Communication: I discussed in detail with patient's spouse via phone, updated care and answered all questions Disposition:  Status is: Inpatient  Remains inpatient appropriate because:Inpatient level of care appropriate due to severity of illness  Dispo: The patient is from: Home              Anticipated d/c is to: Home              Patient currently is not medically stable to d/c.   Difficult to place patient No        Consultants:   General surgery  Procedures:   None  Antimicrobials:    Anti-infectives (From admission, onward)    Start     Dose/Rate Route Frequency Ordered Stop   01/11/21 1400  cefTRIAXone (ROCEPHIN) 2 g in sodium chloride 0.9 % 100 mL IVPB        2 g 200 mL/hr over 30 Minutes Intravenous Every 24 hours 01/10/21 1736     01/10/21 2200  metroNIDAZOLE (FLAGYL) IVPB 500 mg        500 mg 100 mL/hr over 60 Minutes Intravenous Every 8 hours 01/10/21 1711     01/10/21 1800  cefTRIAXone (ROCEPHIN) 1 g in sodium chloride 0.9 % 100 mL IVPB        1 g 200 mL/hr over 30 Minutes Intravenous Every 24 hours 01/10/21 1711 01/10/21 1838   01/10/21 1330  cefTRIAXone (ROCEPHIN) 1 g in sodium chloride 0.9 % 100 mL IVPB        1 g 200 mL/hr over 30 Minutes Intravenous  Once 01/10/21 1321 01/10/21 1523   01/10/21 1330  metroNIDAZOLE (FLAGYL) IVPB 500 mg        500 mg 100 mL/hr over 60 Minutes Intravenous  Once 01/10/21 1321 01/10/21 1524         Subjective:  Patient reported that he had a BM at approximately 3 AM with subsequent relief of abdominal pain.  Denies nausea or vomiting.  No dyspnea or chest pain.  Lives with his spouse.   Independent.  Recently saw PCCM as outpatient  Objective:   Vitals:   01/11/21 0756 01/11/21 0804 01/11/21 0840 01/11/21 1119  BP:  113/64  121/69  Pulse:  91  82  Resp:  18  20  Temp:  98.1 F (36.7 C)    TempSrc:  Oral    SpO2: 92% (!) 85% 93% 92%  Weight:      Height:        General exam: Elderly male, small built and frail, lying comfortably propped up in bed without distress.  Has [place] oxygen on. Respiratory system: Diminished breath sounds in the right base but otherwise clear to auscultation.  No increased work of breathing.  Able to speak in full sentences. Cardiovascular system: S1 & S2 heard, RRR. No JVD, murmurs, rubs, gallops or clicks. No pedal edema.  Telemetry personally reviewed: Sinus rhythm with BBB morphology. Gastrointestinal system: Abdomen is nondistended, soft and nontender. No organomegaly or masses felt. Normal  bowel sounds heard.  Large subumbilical ventral hernia without complicating features. Central nervous system: Alert and oriented. No focal neurological deficits. Extremities: Symmetric 5 x 5 power. Skin: No rashes, lesions or ulcers Psychiatry: Judgement and insight appear normal. Mood & affect appropriate.     Data Reviewed:   I have personally reviewed following labs and imaging studies   CBC: Recent Labs  Lab 01/10/21 0816 01/11/21 0254  WBC 11.7* 10.8*  HGB 11.4* 10.8*  HCT 39.2 37.5*  MCV 79.4* 78.9*  PLT 425* 353    Basic Metabolic Panel: Recent Labs  Lab 01/10/21 0816 01/11/21 0254  NA 133* 139  K 4.3 3.6  CL 95* 99  CO2 25 26  GLUCOSE 153* 104*  BUN 16 15  CREATININE 1.07 0.97  CALCIUM 9.2 9.0  MG  --  1.8    Liver Function Tests: Recent Labs  Lab 01/10/21 0816  AST 21  ALT 17  ALKPHOS 66  BILITOT 0.8  PROT 7.2  ALBUMIN 3.3*    CBG: Recent Labs  Lab 01/11/21 0721 01/11/21 1116  GLUCAP 94 82    Microbiology Studies:   Recent Results (from the past 240 hour(s))  Resp Panel by RT-PCR (Flu A&B, Covid)  Nasopharyngeal Swab     Status: None   Collection Time: 01/10/21  2:05 PM   Specimen: Nasopharyngeal Swab; Nasopharyngeal(NP) swabs in vial transport medium  Result Value Ref Range Status   SARS Coronavirus 2 by RT PCR NEGATIVE NEGATIVE Final    Comment: (NOTE) SARS-CoV-2 target nucleic acids are NOT DETECTED.  The SARS-CoV-2 RNA is generally detectable in upper respiratory specimens during the acute phase of infection. The lowest concentration of SARS-CoV-2 viral copies this assay can detect is 138 copies/mL. A negative result does not preclude SARS-Cov-2 infection and should not be used as the sole basis for treatment or other patient management decisions. A negative result may occur with  improper specimen collection/handling, submission of specimen other than nasopharyngeal swab, presence of viral mutation(s) within the areas targeted by this assay, and inadequate number of viral copies(<138 copies/mL). A negative result must be combined with clinical observations, patient history, and epidemiological information. The expected result is Negative.  Fact Sheet for Patients:  EntrepreneurPulse.com.au  Fact Sheet for Healthcare Providers:  IncredibleEmployment.be  This test is no t yet approved or cleared by the [place] FDA and  has been authorized for detection and/or diagnosis of SARS-CoV-2 by FDA under an Emergency Use Authorization (EUA). This EUA will remain  in effect (meaning this test can be used) for the duration of the COVID-19 declaration under Section 564(b)(1) of the Act, 21 U.S.C.section 360bbb-3(b)(1), unless the authorization is terminated  or revoked sooner.       Influenza A by PCR NEGATIVE NEGATIVE Final   Influenza B by PCR NEGATIVE NEGATIVE Final    Comment: (NOTE) The Xpert Xpress SARS-CoV-2/FLU/RSV plus assay is intended as an aid in the diagnosis of influenza from Nasopharyngeal swab specimens and should not be  used as a sole basis for treatment. Nasal washings and aspirates are unacceptable for Xpert Xpress SARS-CoV-2/FLU/RSV testing.  Fact Sheet for Patients: EntrepreneurPulse.com.au  Fact Sheet for Healthcare Providers: IncredibleEmployment.be  This test is not yet approved or cleared by the [place] FDA and has been authorized for detection and/or diagnosis of SARS-CoV-2 by FDA under an Emergency Use Authorization (EUA). This EUA will remain in effect (meaning this test can be used) for the duration of the COVID-19 declaration under Section  564(b)(1) of the Act, 21 U.S.C. section 360bbb-3(b)(1), unless the authorization is terminated or revoked.  Performed at [facility], [place] [address] [place], [place] [address]      Radiology Studies:  CT ABDOMEN PELVIS W CONTRAST  Result Date: 01/10/2021 CLINICAL DATA:  Hernia EXAM: CT ABDOMEN AND PELVIS WITH CONTRAST TECHNIQUE: Multidetector CT imaging of the abdomen and pelvis was performed using the standard protocol following bolus administration of intravenous contrast. CONTRAST:  141m OMNIPAQUE IOHEXOL 300 MG/ML  SOLN COMPARISON:  CT abdomen and pelvis dated September 06, 2020 FINDINGS: Lower chest: Partially visualized moderate loculated right pleural effusion. No pericardial effusion. Hepatobiliary: No focal liver abnormality is seen. No gallstones, gallbladder wall thickening, or biliary dilatation. Pancreas: Chest tube Spleen: Normal in size without focal abnormality. Adrenals/Urinary Tract: Adrenal glands are unremarkable. Kidneys are normal, without renal calculi, focal lesion, or hydronephrosis. Bladder is unremarkable. Stomach/Bowel: Wall thickening of the terminal ileum with multiple upstream dilated loops of distal bowel. Small bowel feces sign, indicating slow transit. Normal appendix. No evidence of pneumatosis or bowel wall hypoenhancement. Vascular/Lymphatic: Aortic atherosclerosis. No  enlarged abdominal or pelvic lymph nodes. Reproductive: Prostate is unremarkable. Other: Large fat containing umbilical hernia with associated calcification. No free intraperitoneal fluid or air. Musculoskeletal: No acute or significant osseous findings. ORIF of previous sacroiliac diastasis. IMPRESSION: Short-segment terminal ileitis causing upstream small bowel obstruction. Large fat containing umbilical hernia. Partially visualized moderate loculated right pleural effusion. Electronically Signed   By: LYetta GlassmanM.D.   On: 01/10/2021 12:54   DG Abd Portable 1V-Small Bowel Obstruction Protocol-initial, 8 hr delay  Result Date: 01/11/2021 CLINICAL DATA:  8 hour follow-up small-bowel obstruction EXAM: PORTABLE ABDOMEN - 1 VIEW COMPARISON:  CT from the previous day. FINDINGS: Administered contrast material now lies scattered throughout the small bowel and colon indicating a partial small bowel obstruction. Some residual small bowel dilatation is seen. Postsurgical changes are noted in the sacrum. Contrast distends the urinary bladder. IMPRESSION: Persistent small bowel dilatation although contrast material passes into the colon consistent with a partial small bowel obstruction. Electronically Signed   By: MInez CatalinaM.D.   On: 01/11/2021 03:14     Scheduled Meds:    fluticasone furoate-vilanterol  1 puff Inhalation Daily   pantoprazole (PROTONIX) IV  40 mg Intravenous Q24H   sodium chloride flush  3 mL Intravenous Q12H   umeclidinium bromide  1 puff Inhalation Daily    Continuous Infusions:    cefTRIAXone (ROCEPHIN)  IV     heparin 1,400 Units/hr (01/11/21 0546)   lactated ringers 50 mL/hr at 01/10/21 1805   metronidazole 500 mg (01/11/21 0457)     LOS: 1 day     AVernell Leep MD, [facility]    To contact the attending provider between 7A-7P or the covering provider during after hours 7P-7A, please log into the web site [email or web address] and access using  universal [place] password for that web site. If you do not have the password, please call the hospital operator.  01/11/2021, 12:52 PM
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
## 2021-01-12 LAB — MAGNESIUM: Magnesium: 1.8 mg/dL (ref 1.7–2.4)

## 2021-01-12 LAB — CBC
HCT: 36.4 % — ABNORMAL LOW (ref 39.0–52.0)
Hemoglobin: 10.9 g/dL — ABNORMAL LOW (ref 13.0–17.0)
MCH: 23.2 pg — ABNORMAL LOW (ref 26.0–34.0)
MCHC: 29.9 g/dL — ABNORMAL LOW (ref 30.0–36.0)
MCV: 77.6 fL — ABNORMAL LOW (ref 80.0–100.0)
Platelets: 399 10*3/uL (ref 150–400)
RBC: 4.69 MIL/uL (ref 4.22–5.81)
RDW: 21.7 % — ABNORMAL HIGH (ref 11.5–15.5)
WBC: 7.9 10*3/uL (ref 4.0–10.5)
nRBC: 0 % (ref 0.0–0.2)

## 2021-01-12 LAB — APTT: aPTT: 59 seconds — ABNORMAL HIGH (ref 24–36)

## 2021-01-12 LAB — BASIC METABOLIC PANEL
Anion gap: 10 (ref 5–15)
BUN: 13 mg/dL (ref 8–23)
CO2: 27 mmol/L (ref 22–32)
Calcium: 8.6 mg/dL — ABNORMAL LOW (ref 8.9–10.3)
Chloride: 99 mmol/L (ref 98–111)
Creatinine, Ser: 0.97 mg/dL (ref 0.61–1.24)
GFR, Estimated: >60 mL/min (ref >60)
Glucose, Bld: 126 mg/dL — ABNORMAL HIGH (ref 70–99)
Potassium: 3.5 mmol/L (ref 3.5–5.1)
Sodium: 136 mmol/L (ref 135–145)

## 2021-01-12 LAB — GLUCOSE, CAPILLARY: Glucose-Capillary: 123 mg/dL — ABNORMAL HIGH (ref 70–99)

## 2021-01-12 LAB — HEPARIN LEVEL (UNFRACTIONATED): Heparin Unfractionated: 0.67 IU/mL (ref 0.30–0.70)

## 2021-01-12 MED ORDER — AMOXICILLIN-POT CLAVULANATE 875-125 MG PO TABS: 1.0000 | ORAL_TABLET | Freq: Two times a day (BID) | ORAL | 0 refills | Status: AC

## 2021-01-12 MED ORDER — APIXABAN 5 MG PO TABS
5.0000 mg | ORAL_TABLET | Freq: Two times a day (BID) | ORAL | Status: DC
  Administered 2021-01-12: 5 mg via ORAL
  Filled 2021-01-12: qty 1

## 2021-01-12 NOTE — Progress Notes (Addendum)
ANTICOAGULATION CONSULT NOTE  Pharmacy Consult for Heparin Indication: atrial fibrillation  Allergies  Allergen Reactions   Novocain [Procaine] Other (See Comments)    Unknown reaction as a young child    Patient Measurements: Height: 5' 7"  (170.2 cm) Weight: 76.9 kg (169 lb 8.5 oz) IBW/kg (Calculated) : 66.1 Heparin Dosing Weight: 76.9 kg  Vital Signs: Temp: 98.1 F (36.7 C) (08/27 0728) Temp Source: Oral (08/27 0728) BP: 115/58 (08/27 0728) Pulse Rate: 67 (08/27 0728)  Labs: Recent Labs    01/10/21 0816 01/10/21 0816 01/10/21 1436 01/11/21 0254 01/11/21 1310 01/11/21 2250 01/12/21 0739  HGB 11.4*  --   --  10.8*  --   --  10.9*  HCT 39.2  --   --  37.5*  --   --  36.4*  PLT 425*  --   --  396  --   --  399  APTT  --    < > 33 34 41* 53* 59*  LABPROT  --   --  15.1  --   --   --   --   INR  --   --  1.2  --   --   --   --   HEPARINUNFRC  --   --  >1.10*  --  1.01*  --  0.67  CREATININE 1.07  --   --  0.97  --   --   --    < > = values in this interval not displayed.     Estimated Creatinine Clearance: 64.4 mL/min (by C-G formula based on SCr of 0.97 mg/dL).   Assessment: 70 yo M presenting with abdominal pain. PMH of a.fib on apixaban, type 2 diabetes, hypertension. Patient recently admitted for respiratory failure and bilateral infiltrates and right-sided pleural effusion complicated by decompensated right ventricular failure from pulmonary hypertension. Pt notes PTA occasional nosebleeds and did have a GIB earlier this year.  PTA apixaban, Last dose 8/24 at 1800. Will require aPTT monitoring until heparin level correlates.  01/12/21 HL 0.67, therapeutic  aPTT 59, subtherapeutic   Hgb stable, plt stable  Goal of Therapy:  Heparin level 0.3-0.7 units/ml aPTT 66-102 seconds Monitor platelets by anticoagulation protocol: Yes   Plan:  Will d/c heparin and transition back to Eliquis 5 mg BID. Planning to d/c home today  Thank you for allowing pharmacy  to participate in this patient's care.  Levonne Spiller, PharmD PGY1 Acute Care Resident  01/12/2021,9:03 AM

## 2021-01-12 NOTE — Progress Notes (Signed)
Subjective: CC: Doing well. No abdominal pain. Tolerating FLD without n/v. Passing flatus. Reports BM yesterday.   Objective: Vital signs in last 24 hours: Temp:  [97.9 F (36.6 C)-98.7 F (37.1 C)] 98.1 F (36.7 C) (08/27 0728) Pulse Rate:  [67-83] 67 (08/27 0728) Resp:  [18-20] 18 (08/27 0728) BP: (85-121)/(52-69) 115/58 (08/27 0728) SpO2:  [90 %-100 %] 98 % (08/27 0728) Last BM Date: 01/11/21  Intake/Output from previous day: 08/26 0701 - 08/27 0700 In: 1033.7 [I.V.:535; IV Piggyback:498.7] Out: 350 [Urine:350] Intake/Output this shift: Total I/O In: 136.5 [P.O.:120; I.V.:16.5] Out: -   PE: Gen:  Alert, NAD, pleasant Pulm:  Normal rate and effort on o2 Abd: Soft, mild distension, NT, large ventral hernia that is partially reducible, without any overlying skin changes and is NT. + BS. Psych: A&Ox3  Skin: no rashes noted, warm and dry  Lab Results:  Recent Labs    01/10/21 0816 01/11/21 0254  WBC 11.7* 10.8*  HGB 11.4* 10.8*  HCT 39.2 37.5*  PLT 425* 396   BMET Recent Labs    01/10/21 0816 01/11/21 0254  NA 133* 139  K 4.3 3.6  CL 95* 99  CO2 25 26  GLUCOSE 153* 104*  BUN 16 15  CREATININE 1.07 0.97  CALCIUM 9.2 9.0   PT/INR Recent Labs    01/10/21 1436  LABPROT 15.1  INR 1.2   CMP     Component Value Date/Time   NA 139 01/11/2021 0254   K 3.6 01/11/2021 0254   CL 99 01/11/2021 0254   CO2 26 01/11/2021 0254   GLUCOSE 104 (H) 01/11/2021 0254   BUN 15 01/11/2021 0254   CREATININE 0.97 01/11/2021 0254   CALCIUM 9.0 01/11/2021 0254   CALCIUM 7.7 (L) 09/26/2020 0052   PROT 7.2 01/10/2021 0816   ALBUMIN 3.3 (L) 01/10/2021 0816   AST 21 01/10/2021 0816   ALT 17 01/10/2021 0816   ALKPHOS 66 01/10/2021 0816   BILITOT 0.8 01/10/2021 0816   GFRNONAA >60 01/11/2021 0254   GFRAA 80 (L) 05/14/2013 0835   Lipase     Component Value Date/Time   LIPASE 33 01/10/2021 0816    Studies/Results: CT ABDOMEN PELVIS W CONTRAST  Result  Date: 01/10/2021 CLINICAL DATA:  Hernia EXAM: CT ABDOMEN AND PELVIS WITH CONTRAST TECHNIQUE: Multidetector CT imaging of the abdomen and pelvis was performed using the standard protocol following bolus administration of intravenous contrast. CONTRAST:  133m OMNIPAQUE IOHEXOL 300 MG/ML  SOLN COMPARISON:  CT abdomen and pelvis dated September 06, 2020 FINDINGS: Lower chest: Partially visualized moderate loculated right pleural effusion. No pericardial effusion. Hepatobiliary: No focal liver abnormality is seen. No gallstones, gallbladder wall thickening, or biliary dilatation. Pancreas: Chest tube Spleen: Normal in size without focal abnormality. Adrenals/Urinary Tract: Adrenal glands are unremarkable. Kidneys are normal, without renal calculi, focal lesion, or hydronephrosis. Bladder is unremarkable. Stomach/Bowel: Wall thickening of the terminal ileum with multiple upstream dilated loops of distal bowel. Small bowel feces sign, indicating slow transit. Normal appendix. No evidence of pneumatosis or bowel wall hypoenhancement. Vascular/Lymphatic: Aortic atherosclerosis. No enlarged abdominal or pelvic lymph nodes. Reproductive: Prostate is unremarkable. Other: Large fat containing umbilical hernia with associated calcification. No free intraperitoneal fluid or air. Musculoskeletal: No acute or significant osseous findings. ORIF of previous sacroiliac diastasis. IMPRESSION: Short-segment terminal ileitis causing upstream small bowel obstruction. Large fat containing umbilical hernia. Partially visualized moderate loculated right pleural effusion. Electronically Signed   By: LHosie PoissonD.  On: 01/10/2021 12:54   DG Abd Portable 1V-Small Bowel Obstruction Protocol-24 hr delay  Result Date: 01/11/2021 CLINICAL DATA:  Abdominal pain. Small-bowel obstruction noted EXAM: PORTABLE ABDOMEN - 1 VIEW COMPARISON:  Abdominal radiograph dated 01/11/2021 and CT dated 01/10/2021. FINDINGS: Evaluation is limited due to body  habitus. There is persistent mild dilatation of small-bowel loops measuring up to 3.5 cm in diameter. Air is noted throughout the colon. There is oral contrast in the rectum. Small right pleural effusion and right lung base atelectasis or infiltrate. Diffuse pulmonary interstitial prominence similar to prior radiograph. No acute osseous pathology. Osteopenia with degenerative changes of the spine. Fixation screws of the sacroiliac joint. IMPRESSION: Persistent mild dilatation of small-bowel loops. Electronically Signed   By: Anner Crete M.D.   On: 01/11/2021 19:35   DG Abd Portable 1V-Small Bowel Obstruction Protocol-initial, 8 hr delay  Result Date: 01/11/2021 CLINICAL DATA:  8 hour follow-up small-bowel obstruction EXAM: PORTABLE ABDOMEN - 1 VIEW COMPARISON:  CT from the previous day. FINDINGS: Administered contrast material now lies scattered throughout the small bowel and colon indicating a partial small bowel obstruction. Some residual small bowel dilatation is seen. Postsurgical changes are noted in the sacrum. Contrast distends the urinary bladder. IMPRESSION: Persistent small bowel dilatation although contrast material passes into the colon consistent with a partial small bowel obstruction. Electronically Signed   By: Inez Catalina M.D.   On: 01/11/2021 03:14    Anti-infectives: Anti-infectives (From admission, onward)    Start     Dose/Rate Route Frequency Ordered Stop   01/11/21 1400  cefTRIAXone (ROCEPHIN) 2 g in sodium chloride 0.9 % 100 mL IVPB        2 g 200 mL/hr over 30 Minutes Intravenous Every 24 hours 01/10/21 1736     01/10/21 2200  metroNIDAZOLE (FLAGYL) IVPB 500 mg        500 mg 100 mL/hr over 60 Minutes Intravenous Every 8 hours 01/10/21 1711     01/10/21 1800  cefTRIAXone (ROCEPHIN) 1 g in sodium chloride 0.9 % 100 mL IVPB        1 g 200 mL/hr over 30 Minutes Intravenous Every 24 hours 01/10/21 1711 01/10/21 1838   01/10/21 1330  cefTRIAXone (ROCEPHIN) 1 g in sodium  chloride 0.9 % 100 mL IVPB        1 g 200 mL/hr over 30 Minutes Intravenous  Once 01/10/21 1321 01/10/21 1523   01/10/21 1330  metroNIDAZOLE (FLAGYL) IVPB 500 mg        500 mg 100 mL/hr over 60 Minutes Intravenous  Once 01/10/21 1321 01/10/21 1524        Assessment/Plan SBO Terminal ileitis on CT - Unclear what is causing the patients terminal ileitis picture on CT. He denies diarrhea. No hx of IBD. He is unsure what his last colonoscopy showed [place] Consider GI follow up as outpatient. TRH has started abx.  - Contrast in colon on imaging. Patient with ROBF. Denies abdominal pain, n/v and is tolerating fld. Adv diet. Okay for d/c from our standpoint. We will sign off.    Ventral Hernia - this has been demonstrated on prior CT scans. CT scan today w/ large fat containing umbilical hernia. No overlying skin changes. Evaluated with attending. No indication for surgery at this time. Can follow up as needed to discuss possible elective surgery if he would like.    FEN - Reg, IVF per primary  VTE -  SCDs, heparin gtt (okay to transition back to home meds) ID - On Rocephin/Flagyl    HTN HLD A. Fib on Eliquis at home CAD DM2 GERD and prior gastric ulcer 2/2 H. Pylori OSA Pulm HTN Chronic respiratory failure with known complex R sided pleural effusion followed by [facility]    LOS: 2 days    Jillyn Ledger , [facility] 01/12/2021, 8:19 AM Please see Amion for pager number during day hours 7:00am-4:30pm

## 2021-01-12 NOTE — Progress Notes (Signed)
Discharge teaching complete. Meds, diet, activity, follow up appointments reviewed and all questions answered. Copy of instructions sent to pharmacy. Patient discharged home via wheelchair with daughter.

## 2021-01-13 NOTE — Discharge Summary (Addendum)
Physician Discharge Summary  Gary [name] [place] TGG:[id number] DOB: March 24, 1951 DOA: 01/10/2021  PCP: Jalene Mullet, PA-C  Admit date: 01/10/2021 Discharge date: 01/12/2021  Admitted From: Home  Discharge disposition: Home  Recommendations for Outpatient Follow-Up:   Follow up with your primary care provider in one week.  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  Diet recommendation: Low sodium, heart healthy.  Diabetic  Wound care: None.  Code status: Full.  History of Present Illness:   70 year old married male, independent with medical history significant for hypertension, atrial fibrillation on Eliquis, hyperlipidemia, carotid artery disease, type 2 diabetes mellitus, abdominal wall hernia, chronic respiratory failure with hypoxia on 3-5 L/min home oxygen, duodenal ulcer by EGD 08/2020 with GI bleed and positive [name]. pylori, presented to the hospital with complaints of abdominal pain that started the afternoon prior to admission after eating lunch, with associated nausea and belching without vomiting and no flatus or BM since that day.  CT abdomen and pelvis showed short segment terminal ileitis causing upstream bowel obstruction and large fat-containing umbilical hernia and moderate loculated right pleural effusion.  Patient was then admitted for small bowel obstruction.    Hospital Course:   Following conditions were addressed during hospitalization as listed below,  Small bowel obstruction secondary to terminal ileitis: CT scan of the abdomen pelvis on 8/25 showed short segment terminal ileitis.  Patient was managed conservatively  with IV fluids bowel rest.  General surgery was consulted.  Patient was treated conservatively and gradually improved.  He was able to tolerate oral diet prior to discharge.   Terminal ileitis by CT: Patient was empirically treated with Rocephin and metronidazole initially.  This will be changed to oral Augmentin on discharge.  Spoke with general surgery about it on discharge.  Will need to follow-up with outpatient.  Clear why he he had terminal ileitis.  Colonoscopy more than 10 years ago.  Large ventral hernia Large fat-containing umbilical hernia noted on CT. no evidence of strangulation or obstruction.   Essential hypertension Controlled.  Continue Cardizem.   Hyperlipidemia  Chronic A. fib Patient was on Eliquis amiodarone and Cardizem.  This will be resumed on discharge.   CAD No chest pain.  Acute on chronic diastolic heart failure, Chronic right-sided heart failure/chronic cor pulmonale: 2D echo showed LVEF 55-60% and moderately elevated pulmonary artery systolic pressure of 70.3.  Lasix will be continued on discharge  Type II DM Latest hemoglobin A1c of 6.7,  resume home medications on discharge  GERD/prior duodenal ulcer by EGD with GI bleed and positive [name]. pylori Asymptomatic at this time.  Continue proton pump inhibitor symptoms as  Complex loculated right-sided pleural effusion, recurrent Followed as outpatient by Dr. Valeta Harms, PCCM.  Had last thoracentesis in on July 23.  Has had recurrent thoracentesis.  Recommended outpatient follow-up.  OSA with pulmonary hypertension/Chronic respiratory failure with hypoxia/COPD without exacerbation On CPAP oxygen and sildenafil.   Anemia, chronic:  Possibly of chronic disease.  Stable.  Disposition.   At this time, patient is stable for disposition home with outpatient PCP follow-up  Medical Consultants:   General surgery  Procedures:    None Subjective:  Today, patient was seen and examined at bedside.  Denies any  nausea ,vomiting,  abdominal pain.  Has tolerated oral diet.  Discharge Exam:   Vitals:   01/12/21 0756 01/12/21 0800  BP:    Pulse:    Resp:    Temp:    SpO2: 100% 96%   Vitals:   01/12/21 0529 01/12/21 0728 01/12/21 0756 01/12/21 0800  BP:  (!) 115/58    Pulse: 67 67    Resp: 20 18    Temp:  98.1 F (36.7 C)    TempSrc:  Oral    SpO2: 100% 98% 100% 96%  Weight:      Height:       General: Alert awake, not in obvious distress, elderly male frail comfortable.  Nasal cannula HENT: pupils equally reacting to light,  No scleral pallor or icterus noted. Oral mucosa is moist.  Chest:  Clear breath sounds.  Diminished breath sounds bilaterally. No crackles or wheezes.  CVS: S1 &S2 heard. No murmur.  Regular rate and rhythm. Abdomen: Soft, nontender, nondistended.  Bowel sounds are heard.  Ventral hernia in place.  Nontender reducible Extremities: No cyanosis, clubbing or edema.  Peripheral pulses are palpable. Psych: Alert, awake and oriented, normal mood CNS:  No cranial nerve deficits.  Power equal in all extremities.   Skin: Warm and dry.  No rashes noted.  The results of significant diagnostics from this hospitalization (including imaging, microbiology, ancillary and laboratory) are listed below for reference.     Diagnostic Studies:   CT ABDOMEN PELVIS W CONTRAST  Result Date: 01/10/2021 CLINICAL DATA:  Hernia EXAM: CT ABDOMEN AND PELVIS WITH CONTRAST TECHNIQUE: Multidetector CT imaging of the abdomen and pelvis was performed using the standard protocol following bolus administration of intravenous contrast. CONTRAST:  149m OMNIPAQUE IOHEXOL 300 MG/ML  SOLN COMPARISON:  CT abdomen and pelvis dated September 06, 2020 FINDINGS: Lower chest: Partially visualized moderate loculated right pleural effusion. No pericardial effusion. Hepatobiliary: No focal liver abnormality is seen. No gallstones, gallbladder wall thickening, or biliary dilatation. Pancreas: Chest tube Spleen: Normal in  size without focal abnormality. Adrenals/Urinary Tract: Adrenal glands are unremarkable. Kidneys are normal, without renal calculi, focal lesion, or hydronephrosis. Bladder is unremarkable. Stomach/Bowel: Wall thickening of the terminal ileum with multiple upstream dilated loops of distal bowel. Small bowel feces sign, indicating slow transit. Normal appendix. No evidence of pneumatosis or bowel wall hypoenhancement. Vascular/Lymphatic: Aortic atherosclerosis. No enlarged abdominal or pelvic lymph nodes. Reproductive: Prostate is unremarkable. Other: Large fat containing umbilical hernia with associated calcification. No free intraperitoneal fluid or air. Musculoskeletal: No acute or significant osseous findings. ORIF of previous sacroiliac diastasis. IMPRESSION: Short-segment terminal ileitis causing upstream small bowel obstruction. Large fat containing umbilical hernia. Partially visualized moderate loculated right pleural effusion. Electronically Signed   By: LYetta GlassmanM.D.   On: 01/10/2021 12:54   DG Abd Portable 1V-Small Bowel Obstruction Protocol-24 hr delay  Result Date: 01/11/2021 CLINICAL DATA:  Abdominal pain. Small-bowel obstruction noted EXAM: PORTABLE ABDOMEN - 1 VIEW COMPARISON:  Abdominal radiograph dated 01/11/2021 and CT dated 01/10/2021. FINDINGS: Evaluation is limited due to body habitus. There is persistent mild dilatation of small-bowel loops measuring up to 3.5 cm in diameter. Air is noted throughout the colon. There is oral contrast in the rectum. Small right pleural effusion and right lung base atelectasis or infiltrate. Diffuse pulmonary interstitial prominence similar to prior radiograph. No acute osseous pathology. Osteopenia with degenerative changes of the spine. Fixation screws of the sacroiliac  joint. IMPRESSION: Persistent mild dilatation of small-bowel loops. Electronically Signed   By: Anner Crete M.D.   On: 01/11/2021 19:35   DG Abd Portable 1V-Small Bowel  Obstruction Protocol-initial, 8 hr delay  Result Date: 01/11/2021 CLINICAL DATA:  8 hour follow-up small-bowel obstruction EXAM: PORTABLE ABDOMEN - 1 VIEW COMPARISON:  CT from the previous day. FINDINGS: Administered contrast material now lies scattered throughout the small bowel and colon indicating a partial small bowel obstruction. Some residual small bowel dilatation is seen. Postsurgical changes are noted in the sacrum. Contrast distends the urinary bladder. IMPRESSION: Persistent small bowel dilatation although contrast material passes into the colon consistent with a partial small bowel obstruction. Electronically Signed   By: Inez Catalina M.D.   On: 01/11/2021 03:14     Labs:   Basic Metabolic Panel: Recent Labs  Lab 01/10/21 0816 01/11/21 0254 01/12/21 0739  NA 133* 139 136  K 4.3 3.6 3.5  CL 95* 99 99  CO2 25 26 27   GLUCOSE 153* 104* 126*  BUN 16 15 13   CREATININE 1.07 0.97 0.97  CALCIUM 9.2 9.0 8.6*  MG  --  1.8 1.8   GFR Estimated Creatinine Clearance: 64.4 mL/min (by C-G formula based on SCr of 0.97 mg/dL). Liver Function Tests: Recent Labs  Lab 01/10/21 0816  AST 21  ALT 17  ALKPHOS 66  BILITOT 0.8  PROT 7.2  ALBUMIN 3.3*   Recent Labs  Lab 01/10/21 0816  LIPASE 33   No results for input(s): AMMONIA in the last 168 hours. Coagulation profile Recent Labs  Lab 01/10/21 1436  INR 1.2    CBC: Recent Labs  Lab 01/10/21 0816 01/11/21 0254 01/12/21 0739  WBC 11.7* 10.8* 7.9  HGB 11.4* 10.8* 10.9*  HCT 39.2 37.5* 36.4*  MCV 79.4* 78.9* 77.6*  PLT 425* 396 399   Cardiac Enzymes: No results for input(s): CKTOTAL, CKMB, CKMBINDEX, TROPONINI in the last 168 hours. BNP: Invalid input(s): POCBNP CBG: Recent Labs  Lab 01/11/21 0721 01/11/21 1116 01/11/21 1638 01/11/21 2057 01/12/21 0738  GLUCAP 94 82 79 182* 123*   D-Dimer No results for input(s): DDIMER in the last 72 hours. Hgb A1c Recent Labs    01/11/21 0254  HGBA1C 7.4*   Lipid  Profile No results for input(s): CHOL, HDL, LDLCALC, TRIG, CHOLHDL, LDLDIRECT in the last 72 hours. Thyroid function studies No results for input(s): TSH, T4TOTAL, T3FREE, THYROIDAB in the last 72 hours.  Invalid input(s): FREET3 Anemia work up No results for input(s): VITAMINB12, FOLATE, FERRITIN, TIBC, IRON, RETICCTPCT in the last 72 hours. Microbiology Recent Results (from the past 240 hour(s))  Resp Panel by RT-PCR (Flu A&B, Covid) Nasopharyngeal Swab     Status: None   Collection Time: 01/10/21  2:05 PM   Specimen: Nasopharyngeal Swab; Nasopharyngeal(NP) swabs in vial transport medium  Result Value Ref Range Status   SARS Coronavirus 2 by RT PCR NEGATIVE NEGATIVE Final    Comment: (NOTE) SARS-CoV-2 target nucleic acids are NOT DETECTED.  The SARS-CoV-2 RNA is generally detectable in upper respiratory specimens during the acute phase of infection. The lowest concentration of SARS-CoV-2 viral copies this assay can detect is 138 copies/mL. A negative result does not preclude SARS-Cov-2 infection and should not be used as the sole basis for treatment or other patient management decisions. A negative result may occur with  improper specimen collection/handling, submission of specimen other than nasopharyngeal swab, presence of viral mutation(s) within the areas targeted by this assay, and inadequate number of  viral copies(<138 copies/mL). A negative result must be combined with clinical observations, patient history, and epidemiological information. The expected result is Negative.  Fact Sheet for Patients:  EntrepreneurPulse.com.au  Fact Sheet for Healthcare Providers:  IncredibleEmployment.be  This test is no t yet approved or cleared by the [place] FDA and  has been authorized for detection and/or diagnosis of SARS-CoV-2 by FDA under an Emergency Use Authorization (EUA). This EUA will remain  in effect (meaning this test can be used)  for the duration of the COVID-19 declaration under Section 564(b)(1) of the Act, 21 U.S.C.section 360bbb-3(b)(1), unless the authorization is terminated  or revoked sooner.       Influenza A by PCR NEGATIVE NEGATIVE Final   Influenza B by PCR NEGATIVE NEGATIVE Final    Comment: (NOTE) The Xpert Xpress SARS-CoV-2/FLU/RSV plus assay is intended as an aid in the diagnosis of influenza from Nasopharyngeal swab specimens and should not be used as a sole basis for treatment. Nasal washings and aspirates are unacceptable for Xpert Xpress SARS-CoV-2/FLU/RSV testing.  Fact Sheet for Patients: EntrepreneurPulse.com.au  Fact Sheet for Healthcare Providers: IncredibleEmployment.be  This test is not yet approved or cleared by the [place] FDA and has been authorized for detection and/or diagnosis of SARS-CoV-2 by FDA under an Emergency Use Authorization (EUA). This EUA will remain in effect (meaning this test can be used) for the duration of the COVID-19 declaration under Section 564(b)(1) of the Act, 21 U.S.C. section 360bbb-3(b)(1), unless the authorization is terminated or revoked.  Performed at [facility], [place] [address] [place], [place] [address]      Discharge Instructions:   Discharge Instructions     Call MD for:  persistant nausea and vomiting   Complete by: As directed    Call MD for:  severe uncontrolled pain   Complete by: As directed    Call MD for:  temperature >100.4   Complete by: As directed    Diet - low sodium heart healthy   Complete by: As directed    Discharge instructions   Complete by: As directed    Follow up with your primary care physician in 1 week.  Complete the course of antibiotic.  Please seek medical attention for worsening symptoms including fever, increasing abdominal pain, nausea or vomiting.  Please follow-up with surgery as outpatient if you wish to go through surgery to remove your hernia in  the future   Increase activity slowly   Complete by: As directed       Allergies as of 01/12/2021       Reactions   Novocain [procaine] Other (See Comments)   Unknown reaction as a young child        Medication List     STOP taking these medications    pantoprazole 40 MG tablet Commonly known as: PROTONIX       TAKE these medications    acetaminophen 325 MG tablet Commonly known as: TYLENOL Take 650 mg by mouth every 6 (six) hours as needed for headache (pain).   amiodarone 200 MG tablet Commonly known as: PACERONE Take 1 tablet (200 mg total) by mouth daily.   amoxicillin-clavulanate 875-125 MG tablet Commonly known as: Augmentin Take 1 tablet by mouth 2 (two) times daily for 7 days.   apixaban 5 MG Tabs tablet Commonly known as: ELIQUIS Take 1 tablet (5 mg total) by mouth 2 (two) times daily.   atorvastatin 40 MG tablet Commonly known as: LIPITOR Take 1 tablet (40  mg total) by mouth every evening.   busPIRone 15 MG tablet Commonly known as: BUSPAR Take 7.5 mg by mouth 2 (two) times daily.   diltiazem 180 MG 24 hr capsule Commonly known as: CARDIZEM CD Take 1 capsule (180 mg total) by mouth daily.   Ensure Max Protein Liqd Take 330 mLs (11 oz total) by mouth 2 (two) times daily.   fluticasone furoate-vilanterol 200-25 MCG/INH Aepb Commonly known as: Breo Ellipta Inhale 1 puff into the lungs daily.   Incruse Ellipta 62.5 MCG/INH Aepb Generic drug: umeclidinium bromide Inhale 1 puff into the lungs daily.   IRON PO Take 1 tablet by mouth daily.   Jardiance 10 MG Tabs tablet Generic drug: empagliflozin Take 1 tablet (10 mg total) by mouth daily.   lansoprazole 15 MG capsule Commonly known as: PREVACID Take 15 mg by mouth daily at 12 noon.   magnesium gluconate 500 MG tablet Commonly known as: MAGONATE Take 1 tablet (500 mg total) by mouth 2 (two) times daily.   metFORMIN 500 MG tablet Commonly known as: GLUCOPHAGE Take 2 tablets (1,000  mg total) by mouth 2 (two) times daily with a meal.   midodrine 10 MG tablet Commonly known as: PROAMATINE Take 1 tablet (10 mg total) by mouth 3 (three) times daily with meals.   multivitamin with minerals Tabs tablet Take 1 tablet by mouth daily.   polyethylene glycol 17 g packet Commonly known as: MIRALAX / GLYCOLAX Take 17 g by mouth daily. What changed:  when to take this reasons to take this   potassium chloride SA 20 MEQ tablet Commonly known as: KLOR-CON Take 1 tablet (20 mEq total) by mouth daily.   sildenafil 20 MG tablet Commonly known as: REVATIO Take 2 tablets (40 mg total) by mouth 3 (three) times daily.   sodium chloride 0.65 % Soln nasal spray Commonly known as: OCEAN Place 1 spray into both nostrils as needed for congestion.   torsemide 20 MG tablet Commonly known as: DEMADEX Take 1 tablet (20 mg total) by mouth daily.        Follow-up Information     Gary [name], Gary H, PA-C Follow up in 1 week(s).   Specialty: General Practice Contact information: [facility] [place] [address] [phone]         Gary Munroe, MD .   Specialties: Cardiology, Radiology Contact information: [address] [place] [address] [phone]                  Time coordinating discharge: 39 minutes  Signed:  Georgiana Spillane  [facility] 01/13/2021, 3:09 PM

## 2021-01-14 MED ORDER — INCRUSE ELLIPTA 62.5 MCG/INH IN AEPB: 1.0000 | INHALATION_SPRAY | Freq: Every day | RESPIRATORY_TRACT | 6 refills | Status: DC

## 2021-01-14 MED ORDER — FLUTICASONE FUROATE-VILANTEROL 200-25 MCG/INH IN AEPB: 1.0000 | INHALATION_SPRAY | Freq: Every day | RESPIRATORY_TRACT | 6 refills | Status: DC

## 2021-01-14 NOTE — Addendum Note (Signed)
Addended by: Elie Confer on: 01/14/2021 07:40 PM   Modules accepted: Orders

## 2021-01-16 ENCOUNTER — Other Ambulatory Visit: Payer: Self-pay | Admitting: Cardiology

## 2021-01-16 ENCOUNTER — Other Ambulatory Visit: Payer: Self-pay | Admitting: *Deleted

## 2021-01-16 NOTE — Telephone Encounter (Signed)
Prescription refill request for Eliquis received. Indication: Atrial Fib Last office visit: 12/25/20  Zandra Abts MD Scr: 0.97 on 01/12/21 Age: 70 Weight: 76.7kg  Based on above findings Eliquis 18m twice daily is the appropriate dose.  Refill approved.

## 2021-01-17 NOTE — Patient Outreach (Signed)
[facility]) Care Management  01/16/2021  [facility] 1951/02/25 [id number]  Telephone Assessment-Unsuccessful #4  Several unsuccessful outreach calls to pt with no response to calls/text message/outreach letter. Provider's office able to verify pt's last follow up visiting on 8/2 for hospitalization 7/17-7/26 however pt cancelled an appointments that was scheduled on 8/9 with Dr. Merilyn Baba office.   Pt had another admission however again unable to reach pt for services post operatively once discharged. Based upon the numerous calls with no responses will close this case and notify the provider.  Raina Mina, RN Care Management Coordinator [place] Office [phone]

## 2021-01-18 NOTE — Telephone Encounter (Signed)
ATC left a voicemail for pt to call office on Tuesday to schedule OV with Dr. Valeta Harms.

## 2021-01-31 ENCOUNTER — Encounter: Payer: Self-pay | Admitting: *Deleted

## 2021-01-31 NOTE — Telephone Encounter (Signed)
Patient sent the following MyChart message:   "Do you think it would be OK if I got my Covid booster?" I advised him that I would be on the safe side and ask you about the booster first. Please advise if you think he would be ok getting the covid booster. I looked at his chart and did see that he has received 2 main vaccines and 1 booster. Thanks!

## 2021-01-31 NOTE — Telephone Encounter (Signed)
This encounter was created in error - please disregard.

## 2021-02-01 NOTE — Telephone Encounter (Signed)
With his age and heart history he is very high risk if he were to get covid. I would recommend the new booster, its a combination vaccine of the original vaccine and also a vaccine more specific for the new strains that we are seeing. Would need to be at least 2 months since last booster to get shot. Also as a reminder if he were to get covid there are pills he could take to help prevent covid from getting severe, would recommend calling pcp if ever covid positive even if just very mild symptoms   Zandra Abts MD

## 2021-02-06 MED ORDER — INCRUSE ELLIPTA 62.5 MCG/INH IN AEPB: 1.0000 | INHALATION_SPRAY | Freq: Every day | RESPIRATORY_TRACT | 6 refills | Status: AC

## 2021-02-07 ENCOUNTER — Other Ambulatory Visit: Payer: Self-pay | Admitting: *Deleted

## 2021-02-07 MED ORDER — FLUTICASONE FUROATE-VILANTEROL 200-25 MCG/INH IN AEPB: 1.0000 | INHALATION_SPRAY | Freq: Every day | RESPIRATORY_TRACT | 2 refills | Status: AC

## 2021-02-11 ENCOUNTER — Other Ambulatory Visit: Payer: Self-pay | Admitting: *Deleted

## 2021-02-11 MED ORDER — AMIODARONE HCL 200 MG PO TABS: 200.0000 mg | ORAL_TABLET | Freq: Every day | ORAL | 1 refills | Status: AC

## 2021-02-11 MED ORDER — DILTIAZEM HCL ER COATED BEADS 180 MG PO CP24: 180.0000 mg | ORAL_CAPSULE | Freq: Every day | ORAL | 1 refills | Status: AC

## 2021-02-11 MED ORDER — TORSEMIDE 20 MG PO TABS: 20.0000 mg | ORAL_TABLET | Freq: Every day | ORAL | 1 refills | Status: AC

## 2021-02-11 MED ORDER — APIXABAN 5 MG PO TABS: 5.0000 mg | ORAL_TABLET | Freq: Two times a day (BID) | ORAL | 1 refills | Status: AC

## 2021-02-11 MED ORDER — MIDODRINE HCL 10 MG PO TABS: 10.0000 mg | ORAL_TABLET | Freq: Three times a day (TID) | ORAL | 1 refills | Status: AC

## 2021-02-14 ENCOUNTER — Encounter (HOSPITAL_COMMUNITY): Payer: Self-pay

## 2021-03-06 ENCOUNTER — Encounter (HOSPITAL_COMMUNITY): Payer: Medicare Other | Admitting: Internal Medicine

## 2021-03-14 MED ORDER — FLUTICASONE FUROATE-VILANTEROL 200-25 MCG/ACT IN AEPB: 1.0000 | INHALATION_SPRAY | Freq: Every day | RESPIRATORY_TRACT | 5 refills | Status: AC

## 2021-03-22 ENCOUNTER — Encounter (HOSPITAL_COMMUNITY): Payer: Self-pay

## 2021-03-27 NOTE — Telephone Encounter (Signed)
ATC patient to let him know that there is nothing available for those 2 days and that Dr. Valeta Harms is not here in the afternoon on the 23rd. Left detailed message.

## 2021-04-01 ENCOUNTER — Other Ambulatory Visit: Payer: Self-pay

## 2021-04-01 ENCOUNTER — Ambulatory Visit (HOSPITAL_COMMUNITY)
Admission: RE | Admit: 2021-04-01 | Discharge: 2021-04-01 | Disposition: A | Discharge status: Home / Self Care | Payer: Medicare Other | Source: Ambulatory Visit | Attending: Pulmonary Disease | Admitting: Pulmonary Disease

## 2021-04-01 ENCOUNTER — Telehealth: Payer: Self-pay

## 2021-04-01 DIAGNOSIS — Z66 Do not resuscitate: Secondary | ICD-10-CM | POA: Diagnosis not present

## 2021-04-01 DIAGNOSIS — J90 Pleural effusion, not elsewhere classified: Secondary | ICD-10-CM | POA: Diagnosis not present

## 2021-04-01 DIAGNOSIS — J9621 Acute and chronic respiratory failure with hypoxia: Secondary | ICD-10-CM | POA: Diagnosis not present

## 2021-04-01 DIAGNOSIS — Z23 Encounter for immunization: Secondary | ICD-10-CM | POA: Diagnosis not present

## 2021-04-01 DIAGNOSIS — R0602 Shortness of breath: Secondary | ICD-10-CM

## 2021-04-01 DIAGNOSIS — D649 Anemia, unspecified: Secondary | ICD-10-CM | POA: Diagnosis not present

## 2021-04-01 DIAGNOSIS — G9341 Metabolic encephalopathy: Secondary | ICD-10-CM | POA: Diagnosis not present

## 2021-04-01 DIAGNOSIS — C3491 Malignant neoplasm of unspecified part of right bronchus or lung: Secondary | ICD-10-CM | POA: Diagnosis not present

## 2021-04-01 DIAGNOSIS — J449 Chronic obstructive pulmonary disease, unspecified: Secondary | ICD-10-CM | POA: Diagnosis not present

## 2021-04-01 DIAGNOSIS — Z20822 Contact with and (suspected) exposure to covid-19: Secondary | ICD-10-CM | POA: Diagnosis not present

## 2021-04-01 DIAGNOSIS — Z515 Encounter for palliative care: Secondary | ICD-10-CM | POA: Diagnosis not present

## 2021-04-01 NOTE — Telephone Encounter (Signed)
See telephone note from 04/01/21. Closing encounter

## 2021-04-01 NOTE — Telephone Encounter (Signed)
Received My Chart message from pt stating increased SOB since Saturday. Spoke with wife [name] (per DPR) who states she believes pt may have fluid that need to be drained. RN spoke with Dr. Valeta Harms r/t he has no OV openings today. Dr. Valeta Harms verbally ordered CXR for today and wants to see pt Thursday 04/04/21. Informed [name] of Dr. Juline Patch orders and also pt would need to hold Eliquis if drain was needed on Thursday. Order was placed for CXR at [facility] at [facility] request and pt was scheduled for OV with Dr. Valeta Harms on 04/04/21 at 2pm.  Routing to Dr. Valeta Harms as Juluis Rainier

## 2021-04-01 NOTE — Telephone Encounter (Signed)
Spoke with pt's wife [name] (per DPR) who states she believes pt may need to have some fluid removed r/t increased SOB.I did inform her we did not have any OV openings today. Sent Epic message to Dr. Valeta Harms to advise on situation and informed [name] I would let her know what the outcome is. [name] stated understanding.

## 2021-04-02 NOTE — Progress Notes (Signed)
Yes he needs to hold his anticoagulation that was discussed.  We will plan for thora in the office this week.  Thanks,  BLI  Garner Nash, DO [facility] 04/02/2021 12:15 PM

## 2021-04-02 NOTE — Telephone Encounter (Signed)
Call made to patient, confirmed DOB. Aware of appt and BI recommendations.   Nothing further needed at this time.

## 2021-04-03 ENCOUNTER — Other Ambulatory Visit: Payer: Self-pay

## 2021-04-03 ENCOUNTER — Telehealth: Payer: Self-pay | Admitting: Pulmonary Disease

## 2021-04-03 ENCOUNTER — Encounter: Payer: Self-pay | Admitting: *Deleted

## 2021-04-03 ENCOUNTER — Emergency Department (HOSPITAL_COMMUNITY): Payer: Medicare Other

## 2021-04-03 ENCOUNTER — Inpatient Hospital Stay (HOSPITAL_COMMUNITY): Payer: Medicare Other

## 2021-04-03 ENCOUNTER — Inpatient Hospital Stay (HOSPITAL_COMMUNITY)
Admission: EM | Admit: 2021-04-03 | Discharge: 2021-05-19 | DRG: 180 | Disposition: E | Payer: Medicare Other | Attending: Internal Medicine | Admitting: Internal Medicine

## 2021-04-03 ENCOUNTER — Encounter (HOSPITAL_COMMUNITY): Payer: Self-pay | Admitting: Emergency Medicine

## 2021-04-03 DIAGNOSIS — J9600 Acute respiratory failure, unspecified whether with hypoxia or hypercapnia: Secondary | ICD-10-CM

## 2021-04-03 DIAGNOSIS — E1165 Type 2 diabetes mellitus with hyperglycemia: Secondary | ICD-10-CM | POA: Diagnosis present

## 2021-04-03 DIAGNOSIS — R590 Localized enlarged lymph nodes: Secondary | ICD-10-CM | POA: Diagnosis present

## 2021-04-03 DIAGNOSIS — C3491 Malignant neoplasm of unspecified part of right bronchus or lung: Principal | ICD-10-CM | POA: Diagnosis present

## 2021-04-03 DIAGNOSIS — D75839 Thrombocytosis, unspecified: Secondary | ICD-10-CM | POA: Diagnosis present

## 2021-04-03 DIAGNOSIS — E871 Hypo-osmolality and hyponatremia: Secondary | ICD-10-CM | POA: Diagnosis present

## 2021-04-03 DIAGNOSIS — I2781 Cor pulmonale (chronic): Secondary | ICD-10-CM | POA: Diagnosis present

## 2021-04-03 DIAGNOSIS — D649 Anemia, unspecified: Secondary | ICD-10-CM

## 2021-04-03 DIAGNOSIS — J811 Chronic pulmonary edema: Secondary | ICD-10-CM | POA: Diagnosis not present

## 2021-04-03 DIAGNOSIS — G4733 Obstructive sleep apnea (adult) (pediatric): Secondary | ICD-10-CM | POA: Diagnosis present

## 2021-04-03 DIAGNOSIS — G9341 Metabolic encephalopathy: Secondary | ICD-10-CM | POA: Diagnosis not present

## 2021-04-03 DIAGNOSIS — Z884 Allergy status to anesthetic agent status: Secondary | ICD-10-CM

## 2021-04-03 DIAGNOSIS — I5033 Acute on chronic diastolic (congestive) heart failure: Secondary | ICD-10-CM | POA: Diagnosis not present

## 2021-04-03 DIAGNOSIS — E1169 Type 2 diabetes mellitus with other specified complication: Secondary | ICD-10-CM | POA: Diagnosis present

## 2021-04-03 DIAGNOSIS — Z9981 Dependence on supplemental oxygen: Secondary | ICD-10-CM

## 2021-04-03 DIAGNOSIS — J948 Other specified pleural conditions: Secondary | ICD-10-CM | POA: Diagnosis present

## 2021-04-03 DIAGNOSIS — J90 Pleural effusion, not elsewhere classified: Secondary | ICD-10-CM

## 2021-04-03 DIAGNOSIS — I4510 Unspecified right bundle-branch block: Secondary | ICD-10-CM | POA: Diagnosis present

## 2021-04-03 DIAGNOSIS — I2729 Other secondary pulmonary hypertension: Secondary | ICD-10-CM | POA: Diagnosis present

## 2021-04-03 DIAGNOSIS — J9621 Acute and chronic respiratory failure with hypoxia: Secondary | ICD-10-CM | POA: Diagnosis present

## 2021-04-03 DIAGNOSIS — D62 Acute posthemorrhagic anemia: Secondary | ICD-10-CM | POA: Diagnosis present

## 2021-04-03 DIAGNOSIS — Z515 Encounter for palliative care: Secondary | ICD-10-CM | POA: Diagnosis not present

## 2021-04-03 DIAGNOSIS — Z9889 Other specified postprocedural states: Secondary | ICD-10-CM | POA: Diagnosis not present

## 2021-04-03 DIAGNOSIS — R0902 Hypoxemia: Secondary | ICD-10-CM | POA: Diagnosis not present

## 2021-04-03 DIAGNOSIS — R7401 Elevation of levels of liver transaminase levels: Secondary | ICD-10-CM | POA: Diagnosis present

## 2021-04-03 DIAGNOSIS — I959 Hypotension, unspecified: Secondary | ICD-10-CM | POA: Diagnosis not present

## 2021-04-03 DIAGNOSIS — R0689 Other abnormalities of breathing: Secondary | ICD-10-CM | POA: Diagnosis not present

## 2021-04-03 DIAGNOSIS — Z7984 Long term (current) use of oral hypoglycemic drugs: Secondary | ICD-10-CM

## 2021-04-03 DIAGNOSIS — E785 Hyperlipidemia, unspecified: Secondary | ICD-10-CM | POA: Diagnosis present

## 2021-04-03 DIAGNOSIS — J9690 Respiratory failure, unspecified, unspecified whether with hypoxia or hypercapnia: Secondary | ICD-10-CM | POA: Diagnosis not present

## 2021-04-03 DIAGNOSIS — Z87891 Personal history of nicotine dependence: Secondary | ICD-10-CM

## 2021-04-03 DIAGNOSIS — Z7951 Long term (current) use of inhaled steroids: Secondary | ICD-10-CM

## 2021-04-03 DIAGNOSIS — J439 Emphysema, unspecified: Secondary | ICD-10-CM | POA: Diagnosis not present

## 2021-04-03 DIAGNOSIS — E873 Alkalosis: Secondary | ICD-10-CM | POA: Diagnosis present

## 2021-04-03 DIAGNOSIS — J189 Pneumonia, unspecified organism: Secondary | ICD-10-CM | POA: Diagnosis present

## 2021-04-03 DIAGNOSIS — E8809 Other disorders of plasma-protein metabolism, not elsewhere classified: Secondary | ICD-10-CM | POA: Diagnosis present

## 2021-04-03 DIAGNOSIS — I5082 Biventricular heart failure: Secondary | ICD-10-CM | POA: Diagnosis present

## 2021-04-03 DIAGNOSIS — I272 Pulmonary hypertension, unspecified: Secondary | ICD-10-CM | POA: Insufficient documentation

## 2021-04-03 DIAGNOSIS — Z66 Do not resuscitate: Secondary | ICD-10-CM | POA: Diagnosis not present

## 2021-04-03 DIAGNOSIS — J910 Malignant pleural effusion: Secondary | ICD-10-CM | POA: Diagnosis not present

## 2021-04-03 DIAGNOSIS — I3139 Other pericardial effusion (noninflammatory): Secondary | ICD-10-CM | POA: Diagnosis not present

## 2021-04-03 DIAGNOSIS — I2510 Atherosclerotic heart disease of native coronary artery without angina pectoris: Secondary | ICD-10-CM | POA: Diagnosis present

## 2021-04-03 DIAGNOSIS — I482 Chronic atrial fibrillation, unspecified: Secondary | ICD-10-CM | POA: Diagnosis present

## 2021-04-03 DIAGNOSIS — J9622 Acute and chronic respiratory failure with hypercapnia: Secondary | ICD-10-CM | POA: Diagnosis present

## 2021-04-03 DIAGNOSIS — R7989 Other specified abnormal findings of blood chemistry: Secondary | ICD-10-CM | POA: Diagnosis present

## 2021-04-03 DIAGNOSIS — R911 Solitary pulmonary nodule: Secondary | ICD-10-CM | POA: Diagnosis not present

## 2021-04-03 DIAGNOSIS — R918 Other nonspecific abnormal finding of lung field: Secondary | ICD-10-CM | POA: Diagnosis not present

## 2021-04-03 DIAGNOSIS — F410 Panic disorder [episodic paroxysmal anxiety] without agoraphobia: Secondary | ICD-10-CM | POA: Diagnosis present

## 2021-04-03 DIAGNOSIS — R64 Cachexia: Secondary | ICD-10-CM | POA: Diagnosis present

## 2021-04-03 DIAGNOSIS — I110 Hypertensive heart disease with heart failure: Secondary | ICD-10-CM | POA: Diagnosis present

## 2021-04-03 DIAGNOSIS — G4734 Idiopathic sleep related nonobstructive alveolar hypoventilation: Secondary | ICD-10-CM

## 2021-04-03 DIAGNOSIS — I700 Atherosclerosis of aorta: Secondary | ICD-10-CM | POA: Diagnosis not present

## 2021-04-03 DIAGNOSIS — Z79899 Other long term (current) drug therapy: Secondary | ICD-10-CM

## 2021-04-03 DIAGNOSIS — Z8711 Personal history of peptic ulcer disease: Secondary | ICD-10-CM

## 2021-04-03 DIAGNOSIS — J95811 Postprocedural pneumothorax: Secondary | ICD-10-CM | POA: Diagnosis not present

## 2021-04-03 DIAGNOSIS — Z23 Encounter for immunization: Secondary | ICD-10-CM

## 2021-04-03 DIAGNOSIS — Z833 Family history of diabetes mellitus: Secondary | ICD-10-CM

## 2021-04-03 DIAGNOSIS — R570 Cardiogenic shock: Secondary | ICD-10-CM | POA: Diagnosis present

## 2021-04-03 DIAGNOSIS — K2270 Barrett's esophagus without dysplasia: Secondary | ICD-10-CM | POA: Diagnosis present

## 2021-04-03 DIAGNOSIS — Z20822 Contact with and (suspected) exposure to covid-19: Secondary | ICD-10-CM | POA: Diagnosis present

## 2021-04-03 DIAGNOSIS — J939 Pneumothorax, unspecified: Secondary | ICD-10-CM | POA: Diagnosis not present

## 2021-04-03 DIAGNOSIS — D638 Anemia in other chronic diseases classified elsewhere: Secondary | ICD-10-CM | POA: Diagnosis present

## 2021-04-03 DIAGNOSIS — J449 Chronic obstructive pulmonary disease, unspecified: Secondary | ICD-10-CM

## 2021-04-03 DIAGNOSIS — I440 Atrioventricular block, first degree: Secondary | ICD-10-CM | POA: Diagnosis present

## 2021-04-03 DIAGNOSIS — I480 Paroxysmal atrial fibrillation: Secondary | ICD-10-CM | POA: Diagnosis present

## 2021-04-03 DIAGNOSIS — R0602 Shortness of breath: Secondary | ICD-10-CM

## 2021-04-03 DIAGNOSIS — C384 Malignant neoplasm of pleura: Secondary | ICD-10-CM | POA: Diagnosis not present

## 2021-04-03 DIAGNOSIS — J9811 Atelectasis: Secondary | ICD-10-CM | POA: Diagnosis not present

## 2021-04-03 DIAGNOSIS — D6489 Other specified anemias: Secondary | ICD-10-CM | POA: Diagnosis present

## 2021-04-03 DIAGNOSIS — E039 Hypothyroidism, unspecified: Secondary | ICD-10-CM | POA: Diagnosis present

## 2021-04-03 DIAGNOSIS — E876 Hypokalemia: Secondary | ICD-10-CM | POA: Diagnosis present

## 2021-04-03 DIAGNOSIS — K219 Gastro-esophageal reflux disease without esophagitis: Secondary | ICD-10-CM | POA: Diagnosis present

## 2021-04-03 DIAGNOSIS — Z8719 Personal history of other diseases of the digestive system: Secondary | ICD-10-CM

## 2021-04-03 DIAGNOSIS — Z7901 Long term (current) use of anticoagulants: Secondary | ICD-10-CM

## 2021-04-03 DIAGNOSIS — I517 Cardiomegaly: Secondary | ICD-10-CM | POA: Diagnosis not present

## 2021-04-03 DIAGNOSIS — R069 Unspecified abnormalities of breathing: Secondary | ICD-10-CM | POA: Diagnosis not present

## 2021-04-03 DIAGNOSIS — Z8619 Personal history of other infectious and parasitic diseases: Secondary | ICD-10-CM

## 2021-04-03 DIAGNOSIS — Z7189 Other specified counseling: Secondary | ICD-10-CM | POA: Diagnosis not present

## 2021-04-03 DIAGNOSIS — Z6823 Body mass index (BMI) 23.0-23.9, adult: Secondary | ICD-10-CM

## 2021-04-03 DIAGNOSIS — Z8701 Personal history of pneumonia (recurrent): Secondary | ICD-10-CM

## 2021-04-03 DIAGNOSIS — M6284 Sarcopenia: Secondary | ICD-10-CM | POA: Diagnosis not present

## 2021-04-03 DIAGNOSIS — I469 Cardiac arrest, cause unspecified: Secondary | ICD-10-CM | POA: Diagnosis not present

## 2021-04-03 LAB — BASIC METABOLIC PANEL
Anion gap: 9 (ref 5–15)
BUN: 30 mg/dL — ABNORMAL HIGH (ref 8–23)
CO2: 31 mmol/L (ref 22–32)
Calcium: 8.4 mg/dL — ABNORMAL LOW (ref 8.9–10.3)
Chloride: 94 mmol/L — ABNORMAL LOW (ref 98–111)
Creatinine, Ser: 0.85 mg/dL (ref 0.61–1.24)
GFR, Estimated: >60 mL/min (ref >60)
Glucose, Bld: 211 mg/dL — ABNORMAL HIGH (ref 70–99)
Potassium: 4.6 mmol/L (ref 3.5–5.1)
Sodium: 134 mmol/L — ABNORMAL LOW (ref 135–145)

## 2021-04-03 LAB — BLOOD GAS, ARTERIAL
Acid-Base Excess: 8.8 mmol/L — ABNORMAL HIGH (ref 0.0–2.0)
Bicarbonate: 32.4 mmol/L — ABNORMAL HIGH (ref 20.0–28.0)
FIO2: 40
O2 Saturation: 89.3 %
Patient temperature: 37
pCO2 arterial: 44 mmHg (ref 32.0–48.0)
pH, Arterial: 7.484 — ABNORMAL HIGH (ref 7.350–7.450)
pO2, Arterial: 59 mmHg — ABNORMAL LOW (ref 83.0–108.0)

## 2021-04-03 LAB — IRON AND TIBC
Iron: 10 ug/dL — ABNORMAL LOW (ref 45–182)
Saturation Ratios: 3 % — ABNORMAL LOW (ref 17.9–39.5)
TIBC: 302 ug/dL (ref 250–450)
UIBC: 292 ug/dL

## 2021-04-03 LAB — CBC WITH DIFFERENTIAL/PLATELET
Abs Immature Granulocytes: 0.08 10*3/uL — ABNORMAL HIGH (ref 0.00–0.07)
Basophils Absolute: 0 10*3/uL (ref 0.0–0.1)
Basophils Relative: 0 %
Eosinophils Absolute: 0 10*3/uL (ref 0.0–0.5)
Eosinophils Relative: 0 %
HCT: 23.3 % — ABNORMAL LOW (ref 39.0–52.0)
Hemoglobin: 7 g/dL — ABNORMAL LOW (ref 13.0–17.0)
Immature Granulocytes: 1 %
Lymphocytes Relative: 3 %
Lymphs Abs: 0.4 10*3/uL — ABNORMAL LOW (ref 0.7–4.0)
MCH: 23.6 pg — ABNORMAL LOW (ref 26.0–34.0)
MCHC: 30 g/dL (ref 30.0–36.0)
MCV: 78.7 fL — ABNORMAL LOW (ref 80.0–100.0)
Monocytes Absolute: 0.9 10*3/uL (ref 0.1–1.0)
Monocytes Relative: 6 %
Neutro Abs: 12.5 10*3/uL — ABNORMAL HIGH (ref 1.7–7.7)
Neutrophils Relative %: 90 %
Platelets: 467 10*3/uL — ABNORMAL HIGH (ref 150–400)
RBC: 2.96 MIL/uL — ABNORMAL LOW (ref 4.22–5.81)
RDW: 18 % — ABNORMAL HIGH (ref 11.5–15.5)
WBC: 14 10*3/uL — ABNORMAL HIGH (ref 4.0–10.5)
nRBC: 0 % (ref 0.0–0.2)

## 2021-04-03 LAB — RESP PANEL BY RT-PCR (FLU A&B, COVID) ARPGX2
Influenza A by PCR: NEGATIVE
Influenza B by PCR: NEGATIVE
SARS Coronavirus 2 by RT PCR: NEGATIVE

## 2021-04-03 LAB — GLUCOSE, CAPILLARY: Glucose-Capillary: 166 mg/dL — ABNORMAL HIGH (ref 70–99)

## 2021-04-03 LAB — GRAM STAIN

## 2021-04-03 LAB — BODY FLUID CELL COUNT WITH DIFFERENTIAL
Lymphs, Fluid: 87 %
Monocyte-Macrophage-Serous Fluid: 6 % — ABNORMAL LOW (ref 50–90)
Neutrophil Count, Fluid: 7 % (ref 0–25)
Total Nucleated Cell Count, Fluid: 1956 cu mm — ABNORMAL HIGH (ref 0–1000)

## 2021-04-03 LAB — LACTATE DEHYDROGENASE, PLEURAL OR PERITONEAL FLUID: LD, Fluid: 280 U/L — ABNORMAL HIGH (ref 3–23)

## 2021-04-03 LAB — VITAMIN B12: Vitamin B-12: 242 pg/mL (ref 180–914)

## 2021-04-03 LAB — BRAIN NATRIURETIC PEPTIDE: B Natriuretic Peptide: 218 pg/mL — ABNORMAL HIGH (ref 0.0–100.0)

## 2021-04-03 LAB — RETICULOCYTES
Immature Retic Fract: 28.9 % — ABNORMAL HIGH (ref 2.3–15.9)
RBC.: 3.4 MIL/uL — ABNORMAL LOW (ref 4.22–5.81)
Retic Count, Absolute: 71.4 10*3/uL (ref 19.0–186.0)
Retic Ct Pct: 2.1 % (ref 0.4–3.1)

## 2021-04-03 LAB — TROPONIN I (HIGH SENSITIVITY)
Troponin I (High Sensitivity): 16 ng/L (ref <18)
Troponin I (High Sensitivity): 17 ng/L (ref <18)

## 2021-04-03 LAB — PROTIME-INR
INR: 1.2 (ref 0.8–1.2)
Prothrombin Time: 14.7 seconds (ref 11.4–15.2)

## 2021-04-03 LAB — HEMOGLOBIN A1C
Hgb A1c MFr Bld: 6.9 % — ABNORMAL HIGH (ref 4.8–5.6)
Mean Plasma Glucose: 151.33 mg/dL

## 2021-04-03 LAB — MRSA NEXT GEN BY PCR, NASAL: MRSA by PCR Next Gen: DETECTED — AB

## 2021-04-03 LAB — PREPARE RBC (CROSSMATCH)

## 2021-04-03 LAB — GLUCOSE, PLEURAL OR PERITONEAL FLUID: Glucose, Fluid: 133 mg/dL

## 2021-04-03 LAB — PROTEIN, PLEURAL OR PERITONEAL FLUID: Total protein, fluid: 4.1 g/dL

## 2021-04-03 LAB — HEMOGLOBIN AND HEMATOCRIT, BLOOD
HCT: 27.7 % — ABNORMAL LOW (ref 39.0–52.0)
Hemoglobin: 8.6 g/dL — ABNORMAL LOW (ref 13.0–17.0)

## 2021-04-03 LAB — PROCALCITONIN: Procalcitonin: 0.24 ng/mL

## 2021-04-03 LAB — FERRITIN: Ferritin: 20 ng/mL — ABNORMAL LOW (ref 24–336)

## 2021-04-03 LAB — FOLATE: Folate: 31.7 ng/mL (ref >5.9)

## 2021-04-03 MED ORDER — INSULIN ASPART 100 UNIT/ML IJ SOLN
0.0000 [IU] | Freq: Three times a day (TID) | INTRAMUSCULAR | Status: DC
  Administered 2021-04-03: 2 [IU] via SUBCUTANEOUS
  Administered 2021-04-04: 17:00:00 3 [IU] via SUBCUTANEOUS
  Administered 2021-04-04 – 2021-04-05 (×2): 1 [IU] via SUBCUTANEOUS
  Administered 2021-04-05: 3 [IU] via SUBCUTANEOUS

## 2021-04-03 MED ORDER — SODIUM CHLORIDE 0.9 % IV SOLN: 2.0000 g | Freq: Two times a day (BID) | INTRAVENOUS | Status: DC

## 2021-04-03 MED ORDER — SALINE SPRAY 0.65 % NA SOLN
1.0000 | NASAL | Status: DC | PRN
  Administered 2021-04-03: 1 via NASAL
  Filled 2021-04-03: qty 44

## 2021-04-03 MED ORDER — MUPIROCIN 2 % EX OINT
1.0000 "application " | TOPICAL_OINTMENT | Freq: Two times a day (BID) | CUTANEOUS | Status: AC
  Administered 2021-04-03 – 2021-04-08 (×8): 1 via NASAL
  Filled 2021-04-03 (×4): qty 22

## 2021-04-03 MED ORDER — AMIODARONE HCL 200 MG PO TABS
200.0000 mg | ORAL_TABLET | Freq: Every day | ORAL | Status: DC
  Administered 2021-04-03 – 2021-04-18 (×16): 200 mg via ORAL
  Filled 2021-04-03 (×17): qty 1

## 2021-04-03 MED ORDER — FUROSEMIDE 10 MG/ML IJ SOLN
40.0000 mg | Freq: Once | INTRAMUSCULAR | Status: AC
  Administered 2021-04-03: 40 mg via INTRAVENOUS
  Filled 2021-04-03: qty 4

## 2021-04-03 MED ORDER — NOREPINEPHRINE 4 MG/250ML-% IV SOLN
2.0000 ug/min | INTRAVENOUS | Status: DC
Start: 2021-04-03 — End: 2021-04-05
  Administered 2021-04-04: 13:00:00 4 ug/min via INTRAVENOUS
  Filled 2021-04-03: qty 250

## 2021-04-03 MED ORDER — ONDANSETRON HCL 4 MG/2ML IJ SOLN: 4.0000 mg | Freq: Four times a day (QID) | INTRAMUSCULAR | Status: DC | PRN

## 2021-04-03 MED ORDER — MAGNESIUM GLUCONATE 500 MG PO TABS: 500.0000 mg | ORAL_TABLET | Freq: Two times a day (BID) | ORAL | Status: DC

## 2021-04-03 MED ORDER — SILDENAFIL CITRATE 20 MG PO TABS
40.0000 mg | ORAL_TABLET | Freq: Three times a day (TID) | ORAL | Status: DC
  Administered 2021-04-03 – 2021-04-07 (×10): 40 mg via ORAL
  Filled 2021-04-03 (×13): qty 2

## 2021-04-03 MED ORDER — VANCOMYCIN HCL 1500 MG/300ML IV SOLN
1500.0000 mg | Freq: Once | INTRAVENOUS | Status: AC
Start: 2021-04-03 — End: 2021-04-03
  Administered 2021-04-03: 1500 mg via INTRAVENOUS
  Filled 2021-04-03: qty 300

## 2021-04-03 MED ORDER — CHLORHEXIDINE GLUCONATE CLOTH 2 % EX PADS
6.0000 | MEDICATED_PAD | Freq: Every day | CUTANEOUS | Status: DC
  Administered 2021-04-04: 10:00:00 6 via TOPICAL

## 2021-04-03 MED ORDER — MAGNESIUM OXIDE -MG SUPPLEMENT 400 (240 MG) MG PO TABS
400.0000 mg | ORAL_TABLET | Freq: Every day | ORAL | Status: DC
  Administered 2021-04-03 – 2021-04-18 (×16): 400 mg via ORAL
  Filled 2021-04-03 (×17): qty 1

## 2021-04-03 MED ORDER — INFLUENZA VAC A&B SA ADJ QUAD 0.5 ML IM PRSY
0.5000 mL | PREFILLED_SYRINGE | INTRAMUSCULAR | Status: AC
  Administered 2021-04-04: 10:00:00 0.5 mL via INTRAMUSCULAR
  Filled 2021-04-03: qty 0.5

## 2021-04-03 MED ORDER — POLYETHYLENE GLYCOL 3350 17 G PO PACK
17.0000 g | PACK | Freq: Every day | ORAL | Status: DC
  Administered 2021-04-06 – 2021-04-16 (×6): 17 g via ORAL
  Filled 2021-04-03 (×14): qty 1

## 2021-04-03 MED ORDER — ONDANSETRON HCL 4 MG PO TABS: 4.0000 mg | ORAL_TABLET | Freq: Four times a day (QID) | ORAL | Status: DC | PRN

## 2021-04-03 MED ORDER — DILTIAZEM HCL ER COATED BEADS 120 MG PO CP24
120.0000 mg | ORAL_CAPSULE | Freq: Every day | ORAL | Status: DC
  Administered 2021-04-03: 120 mg via ORAL
  Filled 2021-04-03: qty 1

## 2021-04-03 MED ORDER — FLUTICASONE FUROATE-VILANTEROL 200-25 MCG/ACT IN AEPB
1.0000 | INHALATION_SPRAY | Freq: Every day | RESPIRATORY_TRACT | Status: DC
  Administered 2021-04-05 – 2021-04-18 (×14): 1 via RESPIRATORY_TRACT
  Filled 2021-04-03 (×2): qty 28

## 2021-04-03 MED ORDER — BUSPIRONE HCL 15 MG PO TABS
7.5000 mg | ORAL_TABLET | Freq: Two times a day (BID) | ORAL | Status: DC
  Administered 2021-04-03 – 2021-04-18 (×31): 7.5 mg via ORAL
  Filled 2021-04-03 (×20): qty 2
  Filled 2021-04-03: qty 1
  Filled 2021-04-03 (×9): qty 2
  Filled 2021-04-03: qty 1
  Filled 2021-04-03: qty 2

## 2021-04-03 MED ORDER — NOREPINEPHRINE 4 MG/250ML-% IV SOLN
INTRAVENOUS | Status: AC
  Administered 2021-04-03: 2 ug/min via INTRAVENOUS
  Filled 2021-04-03: qty 250

## 2021-04-03 MED ORDER — PANTOPRAZOLE SODIUM 40 MG PO TBEC
40.0000 mg | DELAYED_RELEASE_TABLET | Freq: Every day | ORAL | Status: DC
  Administered 2021-04-03 – 2021-04-04 (×2): 40 mg via ORAL
  Filled 2021-04-03 (×2): qty 1

## 2021-04-03 MED ORDER — ATORVASTATIN CALCIUM 40 MG PO TABS
40.0000 mg | ORAL_TABLET | Freq: Every evening | ORAL | Status: DC
  Administered 2021-04-03 – 2021-04-17 (×15): 40 mg via ORAL
  Filled 2021-04-03 (×15): qty 1

## 2021-04-03 MED ORDER — SODIUM CHLORIDE 0.9 % IV SOLN
250.0000 mL | INTRAVENOUS | Status: DC
  Administered 2021-04-03 – 2021-04-05 (×2): 250 mL via INTRAVENOUS

## 2021-04-03 MED ORDER — ACETAMINOPHEN 325 MG PO TABS
650.0000 mg | ORAL_TABLET | Freq: Four times a day (QID) | ORAL | Status: DC | PRN
  Administered 2021-04-06 – 2021-04-14 (×5): 650 mg via ORAL
  Filled 2021-04-03 (×4): qty 2

## 2021-04-03 MED ORDER — UMECLIDINIUM BROMIDE 62.5 MCG/ACT IN AEPB
1.0000 | INHALATION_SPRAY | Freq: Every day | RESPIRATORY_TRACT | Status: DC
  Administered 2021-04-05 – 2021-04-19 (×14): 1 via RESPIRATORY_TRACT
  Filled 2021-04-03 (×2): qty 7

## 2021-04-03 MED ORDER — ACETAMINOPHEN 650 MG RE SUPP: 650.0000 mg | Freq: Four times a day (QID) | RECTAL | Status: DC | PRN

## 2021-04-03 MED ORDER — NOREPINEPHRINE 4 MG/250ML-% IV SOLN
INTRAVENOUS | Status: AC
  Filled 2021-04-03: qty 250

## 2021-04-03 MED ORDER — SODIUM CHLORIDE 0.9 % IV SOLN
2.0000 g | Freq: Three times a day (TID) | INTRAVENOUS | Status: DC
  Administered 2021-04-03 – 2021-04-04 (×2): 2 g via INTRAVENOUS
  Filled 2021-04-03 (×2): qty 2

## 2021-04-03 MED ORDER — VANCOMYCIN HCL 750 MG/150ML IV SOLN
750.0000 mg | Freq: Two times a day (BID) | INTRAVENOUS | Status: AC
  Administered 2021-04-04 – 2021-04-09 (×11): 750 mg via INTRAVENOUS
  Filled 2021-04-03 (×12): qty 150

## 2021-04-03 MED ORDER — TORSEMIDE 20 MG PO TABS
20.0000 mg | ORAL_TABLET | Freq: Every day | ORAL | Status: DC
  Administered 2021-04-04 – 2021-04-05 (×2): 20 mg via ORAL
  Filled 2021-04-03 (×3): qty 1

## 2021-04-03 MED ORDER — MIDODRINE HCL 5 MG PO TABS
10.0000 mg | ORAL_TABLET | Freq: Three times a day (TID) | ORAL | Status: DC
  Administered 2021-04-03 – 2021-04-18 (×46): 10 mg via ORAL
  Filled 2021-04-03 (×46): qty 2

## 2021-04-03 MED ORDER — SODIUM CHLORIDE 0.9% IV SOLUTION: Freq: Once | INTRAVENOUS | Status: DC

## 2021-04-03 MED ORDER — ACETAMINOPHEN 325 MG PO TABS: 650.0000 mg | ORAL_TABLET | Freq: Four times a day (QID) | ORAL | Status: DC | PRN

## 2021-04-03 NOTE — Telephone Encounter (Signed)
Tried calling [name], no answer so LMTCB  Per BI  Icard, Bradley L, DO to Me     11:59 AM  Yes I have spoke with the ED physician at this time   Patient is being admitted to [facility] from [facility]

## 2021-04-03 NOTE — ED Triage Notes (Signed)
Pt to the ED [facility] with shortness of breath and a cough.  Pt arrives to the ED on a CPAP due to oxygen use of 6L chronically. Pt has end stage COPD and was 15L NR when EMS arrived to the home.  Pt is supposed to have fluid removed from his lung tomorrow at [facility].

## 2021-04-03 NOTE — Telephone Encounter (Signed)
BI pt has appt with you on 11/21, do you want to see him sooner?  He stated that he is feeling like the fluid is the issue that he is having.  He does have appt with [facility] tomorrow.  Please advise .. thanks

## 2021-04-03 NOTE — ED Notes (Addendum)
PATIENT'S RESPIRATIONS WERE NOT CORRECT ON MONITOR. PATIENTS ACTUAL RESPIRATIONS PER MINUTE IS 22

## 2021-04-03 NOTE — Telephone Encounter (Signed)
Tried calling again- LMTCB  After reviewing the pt's chart, he is currently admitted at [facility]  He is scheduled for ov here tomorrow and I am not sure if he will make this appt since not sure when he will be d/c Will send to Dr Valeta Harms as Juluis Rainier

## 2021-04-03 NOTE — Progress Notes (Signed)
Pharmacy Antibiotic Note  Gary [name] is a 70 y.o. male admitted on 03/28/2021 with pneumonia.  Pharmacy has been consulted for Vancomycin and Cefepime dosing.  Plan: Vancomycin 1522m x1 followed by 7511mQ12H (eAUC 451, Scr 0.85 mg/dL) Cefepime 2g Q8H    Height: 5' 7"  (170.2 cm) Weight: 68.4 kg (150 lb 12.7 oz) IBW/kg (Calculated) : 66.1  Temp (24hrs), Avg:99.1 F (37.3 C), Min:98.7 F (37.1 C), Max:99.4 F (37.4 C)  Recent Labs  Lab 03/27/2021 0958  WBC 14.0*  CREATININE 0.85    Estimated Creatinine Clearance: 73.4 mL/min (by C-G formula based on SCr of 0.85 mg/dL).    Allergies  Allergen Reactions   Novocain [Procaine] Other (See Comments)    Unknown reaction as a young child     Microbiology results: 11/16 pleural Cx: pending 11/16 MRSA PCR: positive   Thank you for allowing pharmacy to be a part of this patient's care.  AdArdyth HarpsPharmD Clinical Pharmacist

## 2021-04-03 NOTE — Telephone Encounter (Signed)
Lm x1 for patient.  

## 2021-04-03 NOTE — Procedures (Signed)
Thoracentesis  Procedure Note  Anthonyjames Bargar  [id number]  1950/05/25  Date:04/02/2021  Time:4:11 PM   Provider Performing:Nox Talent   Procedure: Thoracentesis with imaging guidance (26333)  Indication(s) Pleural Effusion  Consent Risks of the procedure as well as the alternatives and risks of each were explained to the patient and/or caregiver.  Consent for the procedure was obtained and is signed in the bedside chart  Anesthesia Topical only with 1% lidocaine    Time Out Verified patient identification, verified procedure, site/side was marked, verified correct patient position, special equipment/implants available, medications/allergies/relevant history reviewed, required imaging and test results available.   Sterile Technique Maximal sterile technique including full sterile barrier drape, hand hygiene, sterile gown, sterile gloves, mask, hair covering, sterile ultrasound probe cover (if used).  Procedure Description Ultrasound was used to identify appropriate pleural anatomy for placement and overlying skin marked.  Area of drainage cleaned and draped in sterile fashion. Lidocaine was used to anesthetize the skin and subcutaneous tissue.  25 cc's of amber appearing fluid was drained from the right pleural space. Catheter then removed and bandaid applied to site.   Complications/Tolerance None; patient tolerated the procedure well. CT chest without contrast order to assess for loculated pleural effusion   Specimen(s) Sent for glucose, protein, LDH, cell count, gram stain and culture and cytology  Chesley Mires, MD [place] Pager - [phone] 04/08/2021, 4:12 PM

## 2021-04-03 NOTE — Telephone Encounter (Signed)
LMTCB
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
## 2021-04-03 NOTE — Progress Notes (Signed)
Patient transported from the ED to the ICU without any complications.
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
## 2021-04-03 NOTE — Progress Notes (Addendum)
Responded to nursing call:  SBP upper 70s low 80s   Subjective: Patient remains of bipap.  States he is breathing better with bipap.  Denies f/c, cp, vomiting.  Vitals:   03/24/2021 1830 03/26/2021 1840 04/02/2021 1845 04/09/2021 1849  BP: (!) 76/44 (!) 79/53 (!) 69/37 (!) 81/36  Pulse: 82 86 93 85  Resp: (!) 30 (!) 35 (!) 29 (!) 24  Temp: 99.2 F (37.3 C)     TempSrc: Axillary     SpO2: 100% 99% 96% 97%  Weight:      Height:       CV--RRR Lung--diminished BS on right.  Left basilar rales Abd--soft+BS/NT   Assessment/Plan:  Hypotension -peripheral levophed support -received diltiazem at 1730>>d/c further doses -blood culture -empiric vanc/cefepime (MRSA nasal swab +) -pleural fluid--1956 WBC--93% mononuclear cells -follow culture    Orson Eva, DO [facility]

## 2021-04-03 NOTE — Telephone Encounter (Signed)
[name] wife states patient is at [facility] Emergency Room. [name] phone number is [phone].

## 2021-04-03 NOTE — Progress Notes (Signed)
RT transported patient to and from CT on Ventilator BIPAP without complications noted.

## 2021-04-04 ENCOUNTER — Encounter: Payer: Self-pay | Admitting: Pulmonary Disease

## 2021-04-04 ENCOUNTER — Ambulatory Visit: Payer: Medicare Other | Admitting: Pulmonary Disease

## 2021-04-04 ENCOUNTER — Inpatient Hospital Stay (HOSPITAL_COMMUNITY): Payer: Medicare Other

## 2021-04-04 ENCOUNTER — Encounter (HOSPITAL_COMMUNITY): Payer: Self-pay | Admitting: Internal Medicine

## 2021-04-04 ENCOUNTER — Encounter (HOSPITAL_COMMUNITY): Payer: Medicare Other | Admitting: Internal Medicine

## 2021-04-04 DIAGNOSIS — J9621 Acute and chronic respiratory failure with hypoxia: Secondary | ICD-10-CM | POA: Diagnosis not present

## 2021-04-04 DIAGNOSIS — D649 Anemia, unspecified: Secondary | ICD-10-CM | POA: Diagnosis not present

## 2021-04-04 DIAGNOSIS — J90 Pleural effusion, not elsewhere classified: Secondary | ICD-10-CM | POA: Diagnosis not present

## 2021-04-04 DIAGNOSIS — I5033 Acute on chronic diastolic (congestive) heart failure: Secondary | ICD-10-CM

## 2021-04-04 DIAGNOSIS — E871 Hypo-osmolality and hyponatremia: Secondary | ICD-10-CM | POA: Diagnosis not present

## 2021-04-04 DIAGNOSIS — J9622 Acute and chronic respiratory failure with hypercapnia: Secondary | ICD-10-CM

## 2021-04-04 LAB — TYPE AND SCREEN
ABO/RH(D): O POS
Antibody Screen: NEGATIVE
Unit division: 0
Unit division: 0

## 2021-04-04 LAB — BASIC METABOLIC PANEL
Anion gap: 8 (ref 5–15)
BUN: 28 mg/dL — ABNORMAL HIGH (ref 8–23)
CO2: 29 mmol/L (ref 22–32)
Calcium: 8.3 mg/dL — ABNORMAL LOW (ref 8.9–10.3)
Chloride: 99 mmol/L (ref 98–111)
Creatinine, Ser: 0.91 mg/dL (ref 0.61–1.24)
GFR, Estimated: >60 mL/min (ref >60)
Glucose, Bld: 140 mg/dL — ABNORMAL HIGH (ref 70–99)
Potassium: 3.8 mmol/L (ref 3.5–5.1)
Sodium: 136 mmol/L (ref 135–145)

## 2021-04-04 LAB — CBC
HCT: 26.3 % — ABNORMAL LOW (ref 39.0–52.0)
Hemoglobin: 8 g/dL — ABNORMAL LOW (ref 13.0–17.0)
MCH: 24.6 pg — ABNORMAL LOW (ref 26.0–34.0)
MCHC: 30.4 g/dL (ref 30.0–36.0)
MCV: 80.9 fL (ref 80.0–100.0)
Platelets: 428 10*3/uL — ABNORMAL HIGH (ref 150–400)
RBC: 3.25 MIL/uL — ABNORMAL LOW (ref 4.22–5.81)
RDW: 17.8 % — ABNORMAL HIGH (ref 11.5–15.5)
WBC: 12.9 10*3/uL — ABNORMAL HIGH (ref 4.0–10.5)
nRBC: 0.2 % (ref 0.0–0.2)

## 2021-04-04 LAB — GLUCOSE, CAPILLARY
Glucose-Capillary: 121 mg/dL — ABNORMAL HIGH (ref 70–99)
Glucose-Capillary: 100 mg/dL — ABNORMAL HIGH (ref 70–99)
Glucose-Capillary: 249 mg/dL — ABNORMAL HIGH (ref 70–99)
Glucose-Capillary: 151 mg/dL — ABNORMAL HIGH (ref 70–99)

## 2021-04-04 LAB — BPAM RBC
Blood Product Expiration Date: 202212172359
Blood Product Expiration Date: 202212172359
ISSUE DATE / TIME: 202211161333
ISSUE DATE / TIME: 202211161542
Unit Type and Rh: 5100
Unit Type and Rh: 5100

## 2021-04-04 LAB — LACTATE DEHYDROGENASE, PLEURAL OR PERITONEAL FLUID: LD, Fluid: 241 U/L — ABNORMAL HIGH (ref 3–23)

## 2021-04-04 LAB — MRSA NEXT GEN BY PCR, NASAL: MRSA by PCR Next Gen: DETECTED — AB

## 2021-04-04 LAB — IRON AND TIBC
Iron: 201 ug/dL — ABNORMAL HIGH (ref 45–182)
Saturation Ratios: 74 % — ABNORMAL HIGH (ref 17.9–39.5)
TIBC: 272 ug/dL (ref 250–450)
UIBC: 71 ug/dL

## 2021-04-04 LAB — PROTEIN, PLEURAL OR PERITONEAL FLUID: Total protein, fluid: 4 g/dL

## 2021-04-04 LAB — MAGNESIUM: Magnesium: 2 mg/dL (ref 1.7–2.4)

## 2021-04-04 LAB — FERRITIN: Ferritin: 42 ng/mL (ref 24–336)

## 2021-04-04 LAB — ALBUMIN, PLEURAL OR PERITONEAL FLUID: Albumin, Fluid: 1.9 g/dL

## 2021-04-04 MED ORDER — SODIUM CHLORIDE 0.9 % IV SOLN
250.0000 mg | Freq: Once | INTRAVENOUS | Status: AC
  Administered 2021-04-04: 10:00:00 250 mg via INTRAVENOUS
  Filled 2021-04-04: qty 20

## 2021-04-04 MED ORDER — CHLORHEXIDINE GLUCONATE 0.12 % MT SOLN
15.0000 mL | Freq: Two times a day (BID) | OROMUCOSAL | Status: DC
  Administered 2021-04-04 – 2021-04-15 (×10): 15 mL via OROMUCOSAL
  Filled 2021-04-04 (×22): qty 15

## 2021-04-04 MED ORDER — CYANOCOBALAMIN 1000 MCG/ML IJ SOLN
1000.0000 ug | Freq: Once | INTRAMUSCULAR | Status: AC
  Administered 2021-04-04: 10:00:00 1000 ug via INTRAMUSCULAR
  Filled 2021-04-04: qty 1

## 2021-04-04 MED ORDER — SALINE SPRAY 0.65 % NA SOLN
1.0000 | NASAL | Status: DC | PRN
  Filled 2021-04-04: qty 44

## 2021-04-04 MED ORDER — PIPERACILLIN-TAZOBACTAM 3.375 G IVPB
3.3750 g | Freq: Three times a day (TID) | INTRAVENOUS | Status: AC
  Administered 2021-04-04 – 2021-04-09 (×15): 3.375 g via INTRAVENOUS
  Filled 2021-04-04 (×17): qty 50

## 2021-04-04 MED ORDER — ORAL CARE MOUTH RINSE
15.0000 mL | Freq: Two times a day (BID) | OROMUCOSAL | Status: DC
  Administered 2021-04-04 – 2021-04-19 (×14): 15 mL via OROMUCOSAL

## 2021-04-04 NOTE — Procedures (Signed)
Thoracentesis  Procedure Note  Gary [name]  [id number]  1950-12-31  Date:04/04/21  Time:12:59 PM   Provider Performing:Wylan Gentzler   Procedure: Thoracentesis with imaging guidance (14643)  Indication(s) Pleural Effusion  Consent Risks of the procedure as well as the alternatives and risks of each were explained to the patient and/or caregiver.  Consent for the procedure was obtained and is signed in the bedside chart  Anesthesia Topical only with 1% lidocaine    Time Out Verified patient identification, verified procedure, site/side was marked, verified correct patient position, special equipment/implants available, medications/allergies/relevant history reviewed, required imaging and test results available.   Sterile Technique Maximal sterile technique including full sterile barrier drape, hand hygiene, sterile gown, sterile gloves, mask, hair covering, sterile ultrasound probe cover (if used).  Procedure Description Ultrasound was used to identify appropriate pleural anatomy for placement and overlying skin marked.  Area of drainage cleaned and draped in sterile fashion. Lidocaine was used to anesthetize the skin and subcutaneous tissue.  2000 cc's of Blood tinged appearing fluid was drained from the right pleural space. Catheter then removed and bandaid applied to site.   Complications/Tolerance None; patient tolerated the procedure well. Chest X-ray is ordered to confirm no post-procedural complication.   EBL Minimal   Specimen(s) Pleural fluid

## 2021-04-04 NOTE — Procedures (Signed)
Chest [place]:  Bedside US of chest showing large R sided pleural effusion with lung collapse.
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
## 2021-04-04 NOTE — Progress Notes (Signed)
PROGRESS NOTE  Gary [name] KGM:[id number] DOB: 14-Sep-1950 DOA: 04/05/2021 PCP: Jalene Mullet, PA-C  Brief History:   70 y.o. male with medical history of hypertension, atrial fibrillation on apixaban, pulmonary hypertension, diastolic CHF, hyperlipidemia, coronary disease, diabetes mellitus type 2, abdominal wall hernia, chronic respiratory failure on 3-5 L at home, duodenal ulcer by EGD 08/2020 with GI bleed and positive H. pylori, presented shortness of breath that began on 03/30/2021.  The patient follows pulmonary, Dr. Valeta Harms for a loculated right pleural effusion.  He was supposed to have thoracocentesis performed on 03/27/2021.  However the patient has had progressive shortness of breath resulting in an activating EMS.  At baseline, the patient is on 4-5 L at rest.  With activity he increases his oxygen to 6 L.  History is limited secondary to his extremis.  EMS noted the patient to be short of breath with oxygen saturation 80% on nonrebreather.  He was placed on CPAP and transported to the emergency department where he was placed on BiPAP.  The patient has not had any fevers, chills, headache, chest pain, hemoptysis, nausea, vomiting, diarrhea, abdominal pain. He has been off of his apixaban since 03/31/2021 in preparation for thoracocentesis.  His weight has been fairly stable at home with his last weight being 160 pounds on 04/02/2021.  In the emergency department, the patient was afebrile hemodynamically stable with oxygen saturation 94% on BiPAP with FiO2 40.  Pulmonary was consulted to assist with management.  Thoracocentesis performed by Dr. Halford Chessman, but only able to remove 25 cc fluid.  CT chest post thora showed mod sized R-pleural effusion with loculation.  As a result, patient will transferred to [facility] for possible operative intervention.  Assessment/Plan: Acute chronic respiratory failure with hypoxia Secondary to pulmonary edema and pleural effusion with decompensated  right heart failure -on bipap initially -now on 14L HFNC -furosemide IV x 1 given 11/16 -Pulmonary consult appreciated -11/16 Thora--25 cc removed  Right Loculated Pleural Effusion -11/16 CT chest-mod sized R-pleural effusion with loculation. 2.6 x 2.0 cm precarinal LN -11/16 pleural fluid WBC 1956 (93% mononuclear) -will ultimately need transferred to [facility] for possible operative intervention -empiric vanc/cefepime started 11/16  Hypotension -likely multifactorial including being given cardizem CD 11/16 -started on low dose levophed 11/16>>hope to wean off 11/7 -empirically started on vanc, cefepime -follow blood and pleural fluid culture   Pulmonary hypertension -Continue home dose sildenafil -12/04/2020 echo EF 55 to 60%, no WMA, PASP 59.4 -follow Dr. 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--he was offered bronchoscopy, but deferred -Pulmonary consulted   Atrial fibrillation, chronic -Continue amiodarone -Restart apixaban if no further procedures planned -Continue diltiazem CD as his blood pressure allows   Acute on chronic diastolic CHF/cor pulmonale -Echocardiogram as discussed above -Start IV furosemide   Acute blood loss anemia -Patient denies hematochezia or melena -iron saturation 3%, ferritin 20>>nulecit IV x 1 -B12--242>>low normal>>replete -Folic UYQI--34.7 -baseline Hgb ~10 -presented with Hgb 7.0 -transfused 2 units PRBC 11/16   Diabetes mellitus type 2, controlled -01/11/2021 hemoglobin A1c 7.4 -NovoLog sliding scale -Holding metformin and Januvia   GERD/prior duodenal ulcer by EGD with GI bleed and positive H. pylori Asymptomatic at this time.  Continue proton pump inhibitor symptoms   Hyperlipidemia -Continue statin   Coronary artery disease -No chest pain presently   OSA with pulmonary hypertension -On chronic CPAP at home        Status is:  Inpatient  Remains inpatient appropriate because:  hemodynamically unstable requiring levophed, respiratory failure requiring BuiPAP        Family Communication:   spouse updated  Consultants:  pulm  Code Status:  FULL   DVT Prophylaxis:  restart apixaban when no further procedures   Procedures: As Listed in Progress Note Above  Antibiotics: Vanc 11/16>> Cefepime 11/16>>      Subjective: Patient states breathing is better than yesterday.  Denies f/c, cp, n/v/d, abd pain  Objective: Vitals:   04/04/21 0545 04/04/21 0600 04/04/21 0615 04/04/21 0800  BP: (!) 109/57 115/61 (!) 108/56 108/61  Pulse: 72 74 77 78  Resp: (!) 22 (!) 24 (!) 26 20  Temp:    98.3 F (36.8 C)  TempSrc:    Axillary  SpO2: 93% 94% 92% 93%  Weight:      Height:        Intake/Output Summary (Last 24 hours) at 04/04/2021 0835 Last data filed at 04/04/2021 0829 Gross per 24 hour  Intake 1028.68 ml  Output 550 ml  Net 478.68 ml   Weight change:  Exam:  General:  Pt is alert, follows commands appropriately, not in acute distress HEENT: No icterus, No thrush, No neck mass, [place]/AT Cardiovascular: RRR, S1/S2, no rubs, no gallops Respiratory: scattered bilateral rales.  Diminished BS on left Abdomen: Soft/+BS, non tender, non distended, no guarding Extremities: No edema, No lymphangitis, No petechiae, No rashes, no synovitis   Data Reviewed: I have personally reviewed following labs and imaging studies Basic Metabolic Panel: Recent Labs  Lab 04/07/2021 0958 04/04/21 0434  NA 134* 136  K 4.6 3.8  CL 94* 99  CO2 31 29  GLUCOSE 211* 140*  BUN 30* 28*  CREATININE 0.85 0.91  CALCIUM 8.4* 8.3*  MG  --  2.0   Liver Function Tests: No results for input(s): AST, ALT, ALKPHOS, BILITOT, PROT, ALBUMIN in the last 168 hours. No results for input(s): LIPASE, AMYLASE in the last 168 hours. No results for input(s): AMMONIA in the last 168 hours. Coagulation Profile: Recent Labs  Lab 04/15/2021 0958  INR 1.2   CBC: Recent Labs  Lab  03/20/2021 0958 03/31/2021 1952 04/04/21 0434  WBC 14.0*  --  12.9*  NEUTROABS 12.5*  --   --   HGB 7.0* 8.6* 8.0*  HCT 23.3* 27.7* 26.3*  MCV 78.7*  --  80.9  PLT 467*  --  428*   Cardiac Enzymes: No results for input(s): CKTOTAL, CKMB, CKMBINDEX, TROPONINI in the last 168 hours. BNP: Invalid input(s): POCBNP CBG: Recent Labs  Lab 03/27/2021 1720 04/04/21 0759  GLUCAP 166* 121*   HbA1C: Recent Labs    03/23/2021 0958  HGBA1C 6.9*   Urine analysis:    Component Value Date/Time   COLORURINE YELLOW 01/10/2021 1400   APPEARANCEUR CLEAR 01/10/2021 1400   LABSPEC <1.005 (L) 01/10/2021 1400   PHURINE 7.0 01/10/2021 1400   GLUCOSEU >=500 (A) 01/10/2021 1400   HGBUR NEGATIVE 01/10/2021 1400   BILIRUBINUR NEGATIVE 01/10/2021 1400   KETONESUR NEGATIVE 01/10/2021 1400   PROTEINUR NEGATIVE 01/10/2021 1400   NITRITE NEGATIVE 01/10/2021 1400   LEUKOCYTESUR NEGATIVE 01/10/2021 1400   Sepsis Labs: @LABRCNTIP (procalcitonin:4,lacticidven:4) ) Recent Results (from the past 240 hour(s))  Resp Panel by RT-PCR (Flu A&B, Covid) Nasopharyngeal Swab     Status: None   Collection Time: 03/30/2021 10:11 AM   Specimen: Nasopharyngeal Swab; Nasopharyngeal(NP) swabs in vial transport medium  Result Value Ref Range Status   SARS Coronavirus 2 by  RT PCR NEGATIVE NEGATIVE Final    Comment: (NOTE) SARS-CoV-2 target nucleic acids are NOT DETECTED.  The SARS-CoV-2 RNA is generally detectable in upper respiratory specimens during the acute phase of infection. The lowest concentration of SARS-CoV-2 viral copies this assay can detect is 138 copies/mL. A negative result does not preclude SARS-Cov-2 infection and should not be used as the sole basis for treatment or other patient management decisions. A negative result may occur with  improper specimen collection/handling, submission of specimen other than nasopharyngeal swab, presence of viral mutation(s) within the areas targeted by this assay, and  inadequate number of viral copies(<138 copies/mL). A negative result must be combined with clinical observations, patient history, and epidemiological information. The expected result is Negative.  Fact Sheet for Patients:  EntrepreneurPulse.com.au  Fact Sheet for Healthcare Providers:  IncredibleEmployment.be  This test is no t yet approved or cleared by the [place] FDA and  has been authorized for detection and/or diagnosis of SARS-CoV-2 by FDA under an Emergency Use Authorization (EUA). This EUA will remain  in effect (meaning this test can be used) for the duration of the COVID-19 declaration under Section 564(b)(1) of the Act, 21 U.S.C.section 360bbb-3(b)(1), unless the authorization is terminated  or revoked sooner.       Influenza A by PCR NEGATIVE NEGATIVE Final   Influenza B by PCR NEGATIVE NEGATIVE Final    Comment: (NOTE) The Xpert Xpress SARS-CoV-2/FLU/RSV plus assay is intended as an aid in the diagnosis of influenza from Nasopharyngeal swab specimens and should not be used as a sole basis for treatment. Nasal washings and aspirates are unacceptable for Xpert Xpress SARS-CoV-2/FLU/RSV testing.  Fact Sheet for Patients: EntrepreneurPulse.com.au  Fact Sheet for Healthcare Providers: IncredibleEmployment.be  This test is not yet approved or cleared by the [place] FDA and has been authorized for detection and/or diagnosis of SARS-CoV-2 by FDA under an Emergency Use Authorization (EUA). This EUA will remain in effect (meaning this test can be used) for the duration of the COVID-19 declaration under Section 564(b)(1) of the Act, 21 U.S.C. section 360bbb-3(b)(1), unless the authorization is terminated or revoked.  Performed at [facility], [address], [place], [place] [address]   MRSA Next Gen by PCR, Nasal     Status: Abnormal   Collection Time: 04/17/2021  4:08 PM   Specimen:  Nasal Mucosa; Nasal Swab  Result Value Ref Range Status   MRSA by PCR Next Gen DETECTED (A) NOT DETECTED Final    Comment: RESULT CALLED TO, READ BACK BY AND VERIFIED WITH: ASHLEY SHELTON @ 4827 ON 04/10/2021 C VARNER (NOTE) The GeneXpert MRSA Assay (FDA approved for NASAL specimens only), is one component of a comprehensive MRSA colonization surveillance program. It is not intended to diagnose MRSA infection nor to guide or monitor treatment for MRSA infections. Test performance is not FDA approved in patients less than 40 years old. Performed at [facility], [address], [place], [place] [address]   Culture, body fluid w Gram Stain-bottle     Status: None (Preliminary result)   Collection Time: 04/15/2021  4:18 PM   Specimen: Pleura  Result Value Ref Range Status   Specimen Description PLEURAL  Final   Special Requests   Final    BOTTLES DRAWN AEROBIC AND ANAEROBIC Blood Culture adequate volume   Culture   Final    NO GROWTH < 24 HOURS Performed at [facility], [address] [place], [place] [address]    Report Status PENDING  Incomplete  Gram stain     Status: None   Collection Time: 03/29/2021  4:18 PM   Specimen: Pleura  Result Value Ref Range Status   Specimen Description PLEURAL  Final   Special Requests NONE  Final   Gram Stain   Final    WBC PRESENT,BOTH PMN AND MONONUCLEAR NO ORGANISMS SEEN CYTOSPIN SMEAR Performed at [facility], [address], [place], [place] [address]    Report Status 04/10/2021 FINAL  Final  Culture, blood (Routine X 2) w Reflex to ID Panel     Status: None (Preliminary result)   Collection Time: 04/02/2021  7:50 PM   Specimen: BLOOD RIGHT ARM  Result Value Ref Range Status   Specimen Description BLOOD RIGHT ARM  Final   Special Requests   Final    BOTTLES DRAWN AEROBIC AND ANAEROBIC Blood Culture results may not be optimal due to an excessive volume of blood received in culture bottles   Culture   Final    NO GROWTH < 12 HOURS Performed at  [facility], [address], [place], [place] [address]    Report Status PENDING  Incomplete  Culture, blood (Routine X 2) w Reflex to ID Panel     Status: None (Preliminary result)   Collection Time: 04/10/2021  7:50 PM   Specimen: BLOOD LEFT HAND  Result Value Ref Range Status   Specimen Description BLOOD LEFT HAND  Final   Special Requests   Final    BOTTLES DRAWN AEROBIC AND ANAEROBIC Blood Culture adequate volume   Culture   Final    NO GROWTH < 12 HOURS Performed at [facility], [address] [place], [place] [address]    Report Status PENDING  Incomplete     Scheduled Meds:  sodium chloride   Intravenous Once   amiodarone  200 mg Oral Daily   atorvastatin  40 mg Oral QPM   busPIRone  7.5 mg Oral BID   Chlorhexidine Gluconate Cloth  6 each Topical Q0600   fluticasone furoate-vilanterol  1 puff Inhalation Daily   influenza vaccine adjuvanted  0.5 mL Intramuscular Tomorrow-1000   insulin aspart  0-9 Units Subcutaneous TID WC   magnesium oxide  400 mg Oral Daily   midodrine  10 mg Oral TID WC   mupirocin ointment  1 application Nasal BID   pantoprazole  40 mg Oral Daily   polyethylene glycol  17 g Oral Daily   sildenafil  40 mg Oral TID   torsemide  20 mg Oral Daily   umeclidinium bromide  1 puff Inhalation Daily   Continuous Infusions:  sodium chloride Stopped (04/04/21 0823)   ceFEPime (MAXIPIME) IV Stopped (04/04/21 7124)   norepinephrine (LEVOPHED) Adult infusion 3 mcg/min (04/04/21 0829)   vancomycin 150 mL/hr at 04/04/21 5809    Procedures/Studies: DG Chest 2 View  Result Date: 04/01/2021 CLINICAL DATA:  Severe shortness of breath. EXAM: CHEST - 2 VIEW COMPARISON:  CT chest 12/31/2020; X-ray chest 12/10/2020. FINDINGS: The right heart border is obscured. Aortic atherosclerosis is noted. There is a moderately large right pleural effusion which has greatly increased in size from the prior chest radiograph and may have also enlarged from the interval CT. There is  associated right basilar atelectasis or consolidation. Increased interstitial densities are present in the aerated right upper lobe and left lung. No pneumothorax is identified. No acute osseous abnormality is seen. IMPRESSION: 1. Enlarging, moderately large right pleural effusion with right basilar atelectasis or consolidation. 2. Bilateral interstitial densities which could  reflect edema or infection. Electronically Signed   By: Logan Bores M.D.   On: 04/01/2021 17:15   CT CHEST WO CONTRAST  Result Date: 04/11/2021 CLINICAL DATA:  Abnormal chest x-ray with pleural effusion. EXAM: CT CHEST WITHOUT CONTRAST TECHNIQUE: Multidetector CT imaging of the chest was performed following the standard protocol without IV contrast. COMPARISON:  December 31, 2020 FINDINGS: Cardiovascular: There is marked severity calcification of the aortic arch. Normal heart size with marked severity coronary artery calcification. A very small (approximately 5 mm) pericardial effusion is seen. Mediastinum/Nodes: A 2.6 cm x 2.0 cm anterior para carinal lymph node is seen. This is predominant stable in appearance. Thyroid gland, trachea, and esophagus demonstrate no significant findings. Lungs/Pleura: A stable 6 mm noncalcified lung nodule is seen within the posteromedial aspect of the left upper lobe (axial CT image 23, CT series 5). Marked severity areas of airspace disease are seen involving predominantly the right middle lobe, right lower lobe and inferior aspect of the right upper lobe. This is mildly increased in severity when compared to the prior study. A moderate sized right-sided pleural effusion is seen with loculated components seen along the lateral and posteromedial aspects of the right lung. This is mildly decreased in size when compared to the prior study. No pneumothorax is identified. Upper Abdomen: A stable 2.0 cm x 0.9 cm fat density right adrenal mass is seen. Musculoskeletal: Multilevel degenerative changes are noted  throughout the thoracic spine. IMPRESSION: 1. Marked severity airspace disease involving predominantly the right middle lobe, right lower lobe and inferior aspect of the right upper lobe, mildly increased in severity when compared to the prior study. 2. Moderate sized right-sided pleural effusion with loculated components seen along the lateral and posteromedial aspects of the right lung, mildly decreased in size when compared to the prior study. 3. Stable 6 mm noncalcified lung nodule within the posteromedial aspect of the left upper lobe. Non-contrast chest CT at 6-12 months is recommended. If the nodule is stable at time of repeat CT, then future CT at 18-24 months (from today's scan) is considered optional for low-risk patients, but is recommended for high-risk patients. This recommendation follows the consensus statement: Guidelines for Management of Incidental Pulmonary Nodules Detected on CT Images: From the Fleischner Society 2017; Radiology 2017; 284:228-243. 4. Stable 2.6 cm x 2.0 cm anterior para carinal lymph node. 5. 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 6. Marked severity coronary artery calcification. 7. Aortic atherosclerosis. Aortic Atherosclerosis (ICD10-I70.0). Electronically Signed   By: Virgina Norfolk M.D.   On: 04/11/2021 17:30   DG Chest Port 1 View  Result Date: 04/10/2021 CLINICAL DATA:  Shortness of breath, COPD, pleural effusion EXAM: PORTABLE CHEST 1 VIEW COMPARISON:  04/01/2021 FINDINGS: Cardiomegaly. Large right pleural effusion and associated atelectasis or consolidation, similar to prior examination. Diffuse bilateral interstitial pulmonary opacity. The visualized skeletal structures are unremarkable. IMPRESSION: 1. Cardiomegaly with large right pleural effusion and associated atelectasis or consolidation, similar to prior examination. 2. Diffuse bilateral interstitial pulmonary opacity, likely interstitial edema and similar to  prior. No new or focal airspace opacity. 3. Cardiomegaly. Electronically Signed   By: Delanna Ahmadi M.D.   On: 03/26/2021 10:34    Orson Eva, DO  [facility]  If 7PM-7AM, please contact night-coverage [email or web address] Password [email or web address] 04/04/2021, 8:35 AM   LOS: 1 day

## 2021-04-05 ENCOUNTER — Inpatient Hospital Stay (HOSPITAL_COMMUNITY): Payer: Medicare Other

## 2021-04-05 DIAGNOSIS — I5033 Acute on chronic diastolic (congestive) heart failure: Secondary | ICD-10-CM | POA: Diagnosis not present

## 2021-04-05 DIAGNOSIS — J9621 Acute and chronic respiratory failure with hypoxia: Secondary | ICD-10-CM | POA: Diagnosis not present

## 2021-04-05 DIAGNOSIS — Z7189 Other specified counseling: Secondary | ICD-10-CM

## 2021-04-05 DIAGNOSIS — J90 Pleural effusion, not elsewhere classified: Secondary | ICD-10-CM | POA: Diagnosis not present

## 2021-04-05 LAB — BASIC METABOLIC PANEL
Anion gap: 12 (ref 5–15)
BUN: 22 mg/dL (ref 8–23)
CO2: 29 mmol/L (ref 22–32)
Calcium: 8.4 mg/dL — ABNORMAL LOW (ref 8.9–10.3)
Chloride: 94 mmol/L — ABNORMAL LOW (ref 98–111)
Creatinine, Ser: 0.99 mg/dL (ref 0.61–1.24)
GFR, Estimated: >60 mL/min (ref >60)
Glucose, Bld: 139 mg/dL — ABNORMAL HIGH (ref 70–99)
Potassium: 3.7 mmol/L (ref 3.5–5.1)
Sodium: 135 mmol/L (ref 135–145)

## 2021-04-05 LAB — TYPE AND SCREEN
ABO/RH(D): O POS
Antibody Screen: NEGATIVE

## 2021-04-05 LAB — GLUCOSE, CAPILLARY
Glucose-Capillary: 140 mg/dL — ABNORMAL HIGH (ref 70–99)
Glucose-Capillary: 245 mg/dL — ABNORMAL HIGH (ref 70–99)
Glucose-Capillary: 221 mg/dL — ABNORMAL HIGH (ref 70–99)
Glucose-Capillary: 217 mg/dL — ABNORMAL HIGH (ref 70–99)

## 2021-04-05 LAB — MAGNESIUM: Magnesium: 1.9 mg/dL (ref 1.7–2.4)

## 2021-04-05 MED ORDER — PANTOPRAZOLE SODIUM 40 MG PO TBEC
40.0000 mg | DELAYED_RELEASE_TABLET | Freq: Two times a day (BID) | ORAL | Status: DC
  Administered 2021-04-05 – 2021-04-18 (×28): 40 mg via ORAL
  Filled 2021-04-05 (×29): qty 1

## 2021-04-05 MED ORDER — APIXABAN 5 MG PO TABS: 5.0000 mg | ORAL_TABLET | Freq: Two times a day (BID) | ORAL | Status: DC

## 2021-04-05 MED ORDER — LORAZEPAM 0.5 MG PO TABS: 0.2500 mg | ORAL_TABLET | Freq: Four times a day (QID) | ORAL | Status: DC | PRN

## 2021-04-05 MED ORDER — POTASSIUM CHLORIDE 20 MEQ PO PACK
40.0000 meq | PACK | Freq: Once | ORAL | Status: AC
  Administered 2021-04-05: 40 meq via ORAL
  Filled 2021-04-05: qty 2

## 2021-04-05 MED ORDER — INSULIN ASPART 100 UNIT/ML IJ SOLN
0.0000 [IU] | Freq: Three times a day (TID) | INTRAMUSCULAR | Status: DC
  Administered 2021-04-05: 5 [IU] via SUBCUTANEOUS
  Administered 2021-04-06: 3 [IU] via SUBCUTANEOUS
  Administered 2021-04-06: 5 [IU] via SUBCUTANEOUS
  Administered 2021-04-06: 2 [IU] via SUBCUTANEOUS
  Administered 2021-04-07: 8 [IU] via SUBCUTANEOUS
  Administered 2021-04-07: 3 [IU] via SUBCUTANEOUS
  Administered 2021-04-07 – 2021-04-08 (×2): 5 [IU] via SUBCUTANEOUS
  Administered 2021-04-08: 8 [IU] via SUBCUTANEOUS
  Administered 2021-04-09 (×2): 3 [IU] via SUBCUTANEOUS
  Administered 2021-04-09: 2 [IU] via SUBCUTANEOUS
  Administered 2021-04-10 (×2): 3 [IU] via SUBCUTANEOUS
  Administered 2021-04-10: 2 [IU] via SUBCUTANEOUS
  Administered 2021-04-11: 8 [IU] via SUBCUTANEOUS
  Administered 2021-04-11: 3 [IU] via SUBCUTANEOUS
  Administered 2021-04-11: 2 [IU] via SUBCUTANEOUS
  Administered 2021-04-12 (×2): 3 [IU] via SUBCUTANEOUS
  Administered 2021-04-12: 2 [IU] via SUBCUTANEOUS
  Administered 2021-04-13: 5 [IU] via SUBCUTANEOUS
  Administered 2021-04-13 – 2021-04-14 (×2): 8 [IU] via SUBCUTANEOUS
  Administered 2021-04-14: 12:00:00 3 [IU] via SUBCUTANEOUS
  Administered 2021-04-15: 17:00:00 5 [IU] via SUBCUTANEOUS
  Administered 2021-04-15 (×2): 2 [IU] via SUBCUTANEOUS
  Administered 2021-04-16: 3 [IU] via SUBCUTANEOUS
  Administered 2021-04-16: 2 [IU] via SUBCUTANEOUS
  Administered 2021-04-16 – 2021-04-17 (×3): 3 [IU] via SUBCUTANEOUS
  Administered 2021-04-17: 5 [IU] via SUBCUTANEOUS
  Administered 2021-04-18: 2 [IU] via SUBCUTANEOUS
  Administered 2021-04-18: 5 [IU] via SUBCUTANEOUS
  Administered 2021-04-18 – 2021-04-19 (×2): 2 [IU] via SUBCUTANEOUS

## 2021-04-05 MED ORDER — MORPHINE SULFATE 10 MG/5ML PO SOLN
2.5000 mg | ORAL | Status: DC | PRN
  Filled 2021-04-05: qty 2
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
## 2021-04-05 NOTE — Consult Note (Signed)
Consultation Note Date: 04/05/2021   Patient Name: Gary [name]  DOB: 1950/08/01  MRN: [id number]  Age / Sex: 70 y.o., male  PCP: Gary Mullet, PA-C Referring Physician: Candee Furbish, MD  Reason for Consultation: Goals of care  HPI/Patient Profile: 70 y.o. male  with past medical history of a. Fib, pulmonary hypertension, CHF- cor pulmonale (R ventricular failure), DM2, hypothyroidism, anxiety with associated panic attacks, HLD, HTN, GERD, R lung mass with associated lymphadenopathy (have not been able to biopsy), recurrent pleural effusions s/p several thoracentises, chronic respiratory failure on home oxygen,  admitted on 04/02/2021 with worsening shortness of breath. Palliative medicine consulted for goals of care discussion in light of significant lung disease that continues to worsen.   Primary Decision Maker PATIENT- with the support of his spouse- Gary [name]  Discussion: I have reviewed medical records including EPIC notes, labs and imaging, received report from RN, assessed the patient and then met at the bedside along with Gary [name], and daughter Gary [name] to discuss diagnosis prognosis, Gary [name], EOL wishes, disposition and options.  I introduced Palliative Medicine as specialized medical care for people living with serious illness. It focuses on providing relief from the symptoms and stress of a serious illness. The goal is to improve quality of life for both the patient and the family. We also assist with advanced care planning and goals of care in serious and possibly life limiting illness  We discussed a brief life review of the patient- he is a retired Arts administrator for [employer]. He is known well in his community and has volunteered with many community organizations. 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 He values his family and his faith. He has  10 dogs that bring him great joy. He also enjoys Customer service manager.   Functional status was reviewed- prior to this admission he was able to ambulate in his home- would spend majority of his time in his recliner, painting, watching westerns. He was able to cook a meal for himself. He was able to complete ADL's with minor assistance (used a shower chair). His functional status has declined since January - Gary notes that each hospitalization he declines a little further and doesn't quite get back to where he was before. He has lost weight- but he eats well and has a good appetite. No changes in his memory or cognitive status. He stays awake most of the day.   We reviewed his current state of health, his multiple illnesses that could be life limiting and affect his quality of life.   I attempted to elicit values and goals of care important to the patient. His primary goal is to be able to be at home, sitting up in his chair, painting, enjoying his dogs. If he were not able to do those things, then that would not be what he would consider an acceptable quality of life.    He does not have documented advanced directives but would not want to be kept  alive artificially if he was not going to be able to improve to his above stated goals. He shared the difficulty he had when his daughter suffered a stroke and he and his family had to make the decision to liberate her from life support.   We further explored his feelings about intubation and CPR and how those things would or would not meet his goals of care. We discussed that if his condition worsened and his heart stopped and he was not breathing then resuscitation efforts would not restore him to being able to be at home and sitting up in his chair and painting. He and Gary have both performed CPR and know that often the outcomes are more traumatic than if CPR had not been performed. They are in agreement with DNR order.   Furthermore- if his respiratory status  were to decline to the point where he needed intubation to keep his body alive- he would not want to be intubated. Rather, his preference would be to utilize comfort medications and just be kept comfortable until end of life.   We discussed that given the state of his illness that he would be eligible for Hospice services when he discharges home.   Discussed the importance of continued conversation with family and the medical providers regarding overall plan of care and treatment options, ensuring decisions are within the context of the patient's values and GOCs.    Questions and concerns were addressed.  Hard Choices booklet left for review. The family was encouraged to call with questions or concerns.  PMT will continue to support holistically.  SUMMARY OF RECOMMENDATIONS -GOC are to continue to work to decrease oxygen requirements so that Gary [name] can go home -DNR code status -If respiratory status were to worsen significantly- no intubation, utilize comfort medications to ensure that he was not suffering -History of panic- recommend low dose ativan or morphine for panic attacks    Code Status/Advance Care Planning: DNR   Prognosis:   < 6 months if he is able to improve his current state  Discharge Planning: To Be Determined  Primary Diagnoses: Present on Admission:  Acute on chronic respiratory failure with hypoxia and hypercapnia (HCC)  Right bundle branch block  Hyponatremia  Atrial fibrillation, chronic (HCC)   Review of Systems  Physical Exam  Vital Signs: BP (!) 94/58   Pulse 79   Temp 98.5 F (36.9 C) (Oral)   Resp (!) 22   Ht _0  (1.702 m)   Wt 68.8 kg   SpO2 93%   BMI 23.76 kg/m  Pain Scale: 0-10 POSS *See Group Information*: 1-Acceptable,Awake and alert Pain Score: 0-No pain   SpO2: SpO2: 93 % O2 Device:SpO2: 93 % O2 Flow Rate: .O2 Flow Rate (L/min): (S) 30 L/min  IO: Intake/output summary:  Intake/Output Summary (Last 24 hours) at 04/05/2021  1612 Last data filed at 04/05/2021 0901 Gross per 24 hour  Intake 1345.04 ml  Output 1275 ml  Net 70.04 ml    LBM: Last BM Date: 04/11/2021 Baseline Weight: Weight: 76.9 kg Most recent weight: Weight: 68.8 kg     Palliative Assessment/Data:      Thank you for this consult. Palliative medicine will continue to follow and assist as needed.   Time In: 1400 Time Out: 1600 Time Total: 120 minutes Greater than 50%  of this time was spent counseling and coordinating care related to the above assessment and plan.  Signed by: Mariana Kaufman, AGNP-C Palliative Medicine    Please  contact Palliative Medicine Team phone at [phone] for questions and concerns.  For individual provider: See Amion               

## 2021-04-06 DIAGNOSIS — I482 Chronic atrial fibrillation, unspecified: Secondary | ICD-10-CM | POA: Diagnosis not present

## 2021-04-06 DIAGNOSIS — J9621 Acute and chronic respiratory failure with hypoxia: Secondary | ICD-10-CM | POA: Diagnosis not present

## 2021-04-06 DIAGNOSIS — I5033 Acute on chronic diastolic (congestive) heart failure: Secondary | ICD-10-CM | POA: Diagnosis not present

## 2021-04-06 DIAGNOSIS — J90 Pleural effusion, not elsewhere classified: Secondary | ICD-10-CM | POA: Diagnosis not present

## 2021-04-06 LAB — CBC
HCT: 27.8 % — ABNORMAL LOW (ref 39.0–52.0)
Hemoglobin: 8.2 g/dL — ABNORMAL LOW (ref 13.0–17.0)
MCH: 24 pg — ABNORMAL LOW (ref 26.0–34.0)
MCHC: 29.5 g/dL — ABNORMAL LOW (ref 30.0–36.0)
MCV: 81.5 fL (ref 80.0–100.0)
Platelets: 426 10*3/uL — ABNORMAL HIGH (ref 150–400)
RBC: 3.41 MIL/uL — ABNORMAL LOW (ref 4.22–5.81)
RDW: 18.5 % — ABNORMAL HIGH (ref 11.5–15.5)
WBC: 12.2 10*3/uL — ABNORMAL HIGH (ref 4.0–10.5)
nRBC: 0.2 % (ref 0.0–0.2)

## 2021-04-06 LAB — BASIC METABOLIC PANEL
Anion gap: 9 (ref 5–15)
BUN: 22 mg/dL (ref 8–23)
CO2: 30 mmol/L (ref 22–32)
Calcium: 8.2 mg/dL — ABNORMAL LOW (ref 8.9–10.3)
Chloride: 95 mmol/L — ABNORMAL LOW (ref 98–111)
Creatinine, Ser: 1.05 mg/dL (ref 0.61–1.24)
GFR, Estimated: >60 mL/min (ref >60)
Glucose, Bld: 151 mg/dL — ABNORMAL HIGH (ref 70–99)
Potassium: 4.2 mmol/L (ref 3.5–5.1)
Sodium: 134 mmol/L — ABNORMAL LOW (ref 135–145)

## 2021-04-06 LAB — GLUCOSE, CAPILLARY
Glucose-Capillary: 167 mg/dL — ABNORMAL HIGH (ref 70–99)
Glucose-Capillary: 143 mg/dL — ABNORMAL HIGH (ref 70–99)
Glucose-Capillary: 239 mg/dL — ABNORMAL HIGH (ref 70–99)
Glucose-Capillary: 165 mg/dL — ABNORMAL HIGH (ref 70–99)

## 2021-04-06 MED ORDER — IPRATROPIUM BROMIDE 0.06 % NA SOLN
2.0000 | Freq: Two times a day (BID) | NASAL | Status: DC
Start: 2021-04-06 — End: 2021-04-19
  Administered 2021-04-07 – 2021-04-18 (×18): 2 via NASAL
  Filled 2021-04-06: qty 15

## 2021-04-06 MED ORDER — TORSEMIDE 20 MG PO TABS
20.0000 mg | ORAL_TABLET | Freq: Every day | ORAL | Status: DC
  Administered 2021-04-06 – 2021-04-08 (×3): 20 mg via ORAL
  Filled 2021-04-06 (×4): qty 1

## 2021-04-06 MED ORDER — IPRATROPIUM BROMIDE 0.03 % NA SOLN
2.0000 | Freq: Two times a day (BID) | NASAL | Status: DC
  Filled 2021-04-06: qty 30

## 2021-04-06 NOTE — Progress Notes (Addendum)
Spoke to Dr. Bonner Puna this morning concerning torsemide and sildenafil. SBP was in the 80's and 90's. Advised to hold meds until and advised to give them towards the end of shift due to blood pressure being higher.

## 2021-04-06 NOTE — Progress Notes (Signed)
Pharmacy Antibiotic Note  Gary [name] is a 70 y.o. male admitted on 04/10/2021 with pneumonia.  Pharmacy has been consulted for Vancomycin and pip/tazo dosing. A end date of 11/22 has been placed for zosyn.   Plan: Vancomycin 77m Q12H (eAUC 451, Scr 0.85 mg/dL) Pip/tazo 3.375 q8h Will obtain vancomycin levels if anticipated DOT is >5 days Monitor clinical status and renal function daily    Height: 5' 7"  (170.2 cm) Weight: 68.8 kg (151 lb 10.8 oz) IBW/kg (Calculated) : 66.1  Temp (24hrs), Avg:98.4 F (36.9 C), Min:98.1 F (36.7 C), Max:98.6 F (37 C)  Recent Labs  Lab 04/16/2021 0958 04/04/21 0434 04/05/21 0256 04/06/21 0150  WBC 14.0* 12.9*  --  12.2*  CREATININE 0.85 0.91 0.99 1.05     Estimated Creatinine Clearance: 59.5 mL/min (by C-G formula based on SCr of 1.05 mg/dL).    Allergies  Allergen Reactions   Novocain [Procaine] Other (See Comments)    Unknown reaction as a young child     Microbiology results: 11/16 pleural Cx: NGTD 11/16 MRSA PCR: positive 11/16 BCx: NGTD 11/17 fungus culture: pending   Thank you for allowing pharmacy to be a part of this patient's care.  RJoseph Art Pharm.D. PGY-1 Pharmacy Resident PUIQNV:[phone]/19/2022 8:44 AM

## 2021-04-06 NOTE — Progress Notes (Signed)
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 Initial thoracentesis of loculated pleural effusion was unsuccessful. He was transferred to ICU at [facility] where 2L blood tinged fluid was drained with right thoracentesis on 11/17.   Assessment & Plan: Active Problems:   Atrial fibrillation, chronic (HCC)   Right bundle branch block   Recurrent pleural effusion on right   Hyponatremia   COPD, severe (HCC)   Acute and chronic respiratory failure with hypoxia (HCC)   Acute on chronic respiratory failure with hypoxia and hypercapnia (HCC)   Acute on chronic diastolic CHF (congestive heart failure) (HCC)  Acute on chronic hypoxic respiratory failure: Multifactorial with recurrent exudative pleural effusion, possible aspiration pneumonia, possible advanced pulmonary malignancy (persistent mediastinal adenopathy, recurrent exudative effusion) on chronic pulmonary HTN, COPD, CHF on baseline deconditioning.   - Pt currently requiring 40LPM HHFNC. Pt is DNR, DNI. PCCM and palliative care following. Can use morphine prn air hunger, pain. - Trend CXR in AM - Incentive spirometry. - Continue zosyn, inhaled therapies  PAF: Currently NSR.  - Continue amiodarone - Continue eliquis.   Hypotension:  - continue midodrine. Hold parameters for torsemide, slidenafil discussed.   Hx PUD:  - PPI BID  Chronic HFpEF:  - Tx HTN, continue baseline torsemide  Pulmonary HTN:  - continue sildenafil.   T2DM: HbA1c 6.9%.  - Continue SSI  HLD:  - Continue statin  Anxiety:  - Continue buspar.    DVT prophylaxis:  Eliquis Code Status: DNR Family Communication: Wife at bedside Disposition Plan:  Status is: Inpatient  Remains inpatient appropriate because: Continue respiratory failure  Consultants:  PCCM Palliative  Procedures:  Right thoracentesis 11/17  Antimicrobials: Zosyn   Subjective: Shortness of breath started feeling better after thoracentesis but also steadily improving over past 24 hours, still can't exert himself due to dyspnea and on HHF. No chest pain or bleeding or other new concerns at this time. Hypotensive this AM without symptoms, RN held meds.   Objective: Vitals:   04/06/21 0831 04/06/21 0900 04/06/21 1318 04/06/21 1454  BP: 100/73 91/61 (!) 90/52   Pulse: 97 98 93   Resp:   17   Temp:   98.1 F (36.7 C)   TempSrc:   Oral   SpO2: 91% 93% 95% 90%  Weight:      Height:        Intake/Output Summary (Last 24 hours) at 04/06/2021 1639 Last data filed at 04/06/2021 1513 Gross per 24 hour  Intake 1195.52 ml  Output 1350 ml  Net -154.48 ml   Filed Weights   03/19/2021 1529 04/04/21 0414 04/04/21 1137  Weight: 68.4 kg 70.2 kg 68.8 kg    Gen: Chronically ill-appearing male in no distress  Pulm: Tachypneic at rest with 40LPM HHF, diminished bilaterally.  CV: Regular rate and rhythm. No murmur, rub, or gallop. No JVD, no pitting pedal edema. GI: Abdomen soft, non-tender, non-distended, with normoactive bowel sounds. No organomegaly or masses felt. Ext: Warm, no deformities Skin: No rashes, lesions or ulcers on visualized skin. R posterior thora site c/d/i Neuro: Alert and oriented. No focal neurological deficits. Psych: Judgement and insight appear normal. Mood &  affect appropriate.   Data Reviewed: I have personally reviewed following labs and imaging studies  CBC: Recent Labs  Lab 04/11/2021 0958 03/20/2021 1952 04/04/21 0434 04/06/21 0150  WBC 14.0*  --  12.9* 12.2*  NEUTROABS 12.5*  --   --   --   HGB 7.0* 8.6* 8.0* 8.2*  HCT 23.3* 27.7* 26.3* 27.8*   MCV 78.7*  --  80.9 81.5  PLT 467*  --  428* 768*   Basic Metabolic Panel: Recent Labs  Lab 04/01/2021 0958 04/04/21 0434 04/05/21 0256 04/06/21 0150  NA 134* 136 135 134*  K 4.6 3.8 3.7 4.2  CL 94* 99 94* 95*  CO2 31 29 29 30   GLUCOSE 211* 140* 139* 151*  BUN 30* 28* 22 22  CREATININE 0.85 0.91 0.99 1.05  CALCIUM 8.4* 8.3* 8.4* 8.2*  MG  --  2.0 1.9  --    GFR: Estimated Creatinine Clearance: 59.5 mL/min (by C-G formula based on SCr of 1.05 mg/dL). Liver Function Tests: No results for input(s): AST, ALT, ALKPHOS, BILITOT, PROT, ALBUMIN in the last 168 hours. No results for input(s): LIPASE, AMYLASE in the last 168 hours. No results for input(s): AMMONIA in the last 168 hours. Coagulation Profile: Recent Labs  Lab 04/15/2021 0958  INR 1.2   Cardiac Enzymes: No results for input(s): CKTOTAL, CKMB, CKMBINDEX, TROPONINI in the last 168 hours. BNP (last 3 results) No results for input(s): PROBNP in the last 8760 hours. HbA1C: No results for input(s): HGBA1C in the last 72 hours. CBG: Recent Labs  Lab 04/05/21 1548 04/05/21 1918 04/06/21 0719 04/06/21 1250 04/06/21 1610  GLUCAP 221* 217* 167* 143* 239*   Lipid Profile: No results for input(s): CHOL, HDL, LDLCALC, TRIG, CHOLHDL, LDLDIRECT in the last 72 hours. Thyroid Function Tests: No results for input(s): TSH, T4TOTAL, FREET4, T3FREE, THYROIDAB in the last 72 hours. Anemia Panel: Recent Labs    03/31/2021 1950 04/04/21 1612  FERRITIN  --  42  TIBC  --  272  IRON  --  201*  RETICCTPCT 2.1  --    Urine analysis:    Component Value Date/Time   COLORURINE YELLOW 01/10/2021 1400   APPEARANCEUR CLEAR 01/10/2021 1400   LABSPEC <1.005 (L) 01/10/2021 1400   PHURINE 7.0 01/10/2021 1400   GLUCOSEU >=500 (A) 01/10/2021 1400   HGBUR NEGATIVE 01/10/2021 1400   BILIRUBINUR NEGATIVE 01/10/2021 1400   KETONESUR NEGATIVE 01/10/2021 1400   PROTEINUR NEGATIVE 01/10/2021 1400   NITRITE NEGATIVE 01/10/2021 1400    LEUKOCYTESUR NEGATIVE 01/10/2021 1400   Recent Results (from the past 240 hour(s))  Resp Panel by RT-PCR (Flu A&B, Covid) Nasopharyngeal Swab     Status: None   Collection Time: 04/10/2021 10:11 AM   Specimen: Nasopharyngeal Swab; Nasopharyngeal(NP) swabs in vial transport medium  Result Value Ref Range Status   SARS Coronavirus 2 by RT PCR NEGATIVE NEGATIVE Final    Comment: (NOTE) SARS-CoV-2 target nucleic acids are NOT DETECTED.  The SARS-CoV-2 RNA is generally detectable in upper respiratory specimens during the acute phase of infection. The lowest concentration of SARS-CoV-2 viral copies this assay can detect is 138 copies/mL. A negative result does not preclude SARS-Cov-2 infection and should not be used as the sole basis for treatment or other patient management decisions. A negative result may occur with  improper specimen collection/handling, submission of specimen other than nasopharyngeal swab, presence of viral mutation(s) within the areas targeted by this assay, and inadequate number of viral copies(<138 copies/mL). A negative result must  be combined with clinical observations, patient history, and epidemiological information. The expected result is Negative.  Fact Sheet for Patients:  EntrepreneurPulse.com.au  Fact Sheet for Healthcare Providers:  IncredibleEmployment.be  This test is no t yet approved or cleared by the [place] FDA and  has been authorized for detection and/or diagnosis of SARS-CoV-2 by FDA under an Emergency Use Authorization (EUA). This EUA will remain  in effect (meaning this test can be used) for the duration of the COVID-19 declaration under Section 564(b)(1) of the Act, 21 U.S.C.section 360bbb-3(b)(1), unless the authorization is terminated  or revoked sooner.       Influenza A by PCR NEGATIVE NEGATIVE Final   Influenza B by PCR NEGATIVE NEGATIVE Final    Comment: (NOTE) The Xpert Xpress  SARS-CoV-2/FLU/RSV plus assay is intended as an aid in the diagnosis of influenza from Nasopharyngeal swab specimens and should not be used as a sole basis for treatment. Nasal washings and aspirates are unacceptable for Xpert Xpress SARS-CoV-2/FLU/RSV testing.  Fact Sheet for Patients: EntrepreneurPulse.com.au  Fact Sheet for Healthcare Providers: IncredibleEmployment.be  This test is not yet approved or cleared by the [place] FDA and has been authorized for detection and/or diagnosis of SARS-CoV-2 by FDA under an Emergency Use Authorization (EUA). This EUA will remain in effect (meaning this test can be used) for the duration of the COVID-19 declaration under Section 564(b)(1) of the Act, 21 U.S.C. section 360bbb-3(b)(1), unless the authorization is terminated or revoked.  Performed at [facility], [address], [place], [place] [address]   MRSA Next Gen by PCR, Nasal     Status: Abnormal   Collection Time: 03/23/2021  4:08 PM   Specimen: Nasal Mucosa; Nasal Swab  Result Value Ref Range Status   MRSA by PCR Next Gen DETECTED (A) NOT DETECTED Final    Comment: RESULT CALLED TO, READ BACK BY AND VERIFIED WITH: ASHLEY SHELTON @ 6222 ON 04/02/2021 C VARNER (NOTE) The GeneXpert MRSA Assay (FDA approved for NASAL specimens only), is one component of a comprehensive MRSA colonization surveillance program. It is not intended to diagnose MRSA infection nor to guide or monitor treatment for MRSA infections. Test performance is not FDA approved in patients less than 30 years old. Performed at [facility], [address] [place], [place] [address]   Culture, body fluid w Gram Stain-bottle     Status: None (Preliminary result)   Collection Time: 03/30/2021  4:18 PM   Specimen: Pleura  Result Value Ref Range Status   Specimen Description PLEURAL  Final   Special Requests   Final    BOTTLES DRAWN AEROBIC AND ANAEROBIC Blood Culture adequate volume    Culture   Final    NO GROWTH 3 DAYS Performed at [facility], [address], [place], [place] [address]    Report Status PENDING  Incomplete  Gram stain     Status: None   Collection Time: 04/02/2021  4:18 PM   Specimen: Pleura  Result Value Ref Range Status   Specimen Description PLEURAL  Final   Special Requests NONE  Final   Gram Stain   Final    WBC PRESENT,BOTH PMN AND MONONUCLEAR NO ORGANISMS SEEN CYTOSPIN SMEAR Performed at [facility], [address] [place], [place] [address]    Report Status 04/09/2021 FINAL  Final  Culture, blood (Routine X 2) w Reflex to ID Panel     Status: None (Preliminary result)   Collection Time: 03/21/2021  7:50 PM   Specimen: BLOOD  RIGHT ARM  Result Value Ref Range Status   Specimen Description BLOOD RIGHT ARM  Final   Special Requests   Final    BOTTLES DRAWN AEROBIC AND ANAEROBIC Blood Culture results may not be optimal due to an excessive volume of blood received in culture bottles   Culture   Final    NO GROWTH 3 DAYS Performed at [facility], [address], [place], [place] [address]    Report Status PENDING  Incomplete  Culture, blood (Routine X 2) w Reflex to ID Panel     Status: None (Preliminary result)   Collection Time: 04/16/2021  7:50 PM   Specimen: BLOOD LEFT HAND  Result Value Ref Range Status   Specimen Description BLOOD LEFT HAND  Final   Special Requests   Final    BOTTLES DRAWN AEROBIC AND ANAEROBIC Blood Culture adequate volume   Culture   Final    NO GROWTH 3 DAYS Performed at [facility], [address] [place], [place] [address]    Report Status PENDING  Incomplete  Body fluid culture w Gram Stain     Status: None (Preliminary result)   Collection Time: 04/04/21  1:02 PM   Specimen: Body Fluid  Result Value Ref Range Status   Specimen Description FLUID  Final   Special Requests  PLEURAL  Final   Gram Stain   Final    ABUNDANT WBC PRESENT, PREDOMINANTLY MONONUCLEAR NO ORGANISMS SEEN    Culture   Final     NO GROWTH 2 DAYS Performed at [facility], [address], [place], [place] [address]    Report Status PENDING  Incomplete  MRSA Next Gen by PCR, Nasal     Status: Abnormal   Collection Time: 04/04/21  3:52 PM   Specimen: Nasal Mucosa; Nasal Swab  Result Value Ref Range Status   MRSA by PCR Next Gen DETECTED (A) NOT DETECTED Final    Comment: RESULT CALLED TO, READ BACK BY AND VERIFIED WITH: Brandt Loosen RN [phone] 04/04/21 A BROWNING (NOTE) The GeneXpert MRSA Assay (FDA approved for NASAL specimens only), is one component of a comprehensive MRSA colonization surveillance program. It is not intended to diagnose MRSA infection nor to guide or monitor treatment for MRSA infections. Test performance is not FDA approved in patients less than 37 years old. Performed at [facility], [place] [address], [place],  [address]       Radiology Studies: DG Chest [place] 1 View  Addendum Date: 04/05/2021   ADDENDUM REPORT: 04/05/2021 06:15 ADDENDUM: Note that differential diagnosis of the abnormal right lower lobe on recent Chest CT includes BRONCHOGENIC CARCINOMA. Electronically Signed   By: Genevie Ann M.D.   On: 04/05/2021 06:15   Result Date: 04/05/2021 CLINICAL DATA:  70 year old male with respiratory failure. Status post thoracentesis yesterday. EXAM: PORTABLE CHEST 1 VIEW COMPARISON:  Portable chest 04/04/2021 chest CT 04/06/2021. FINDINGS: Portable AP semi upright view at 0537 hours. Substantially decreased right pneumothorax which appeared to be ex vacuo yesterday. Small volume residual visible in the apex and along the right lateral ribs. Ongoing right lower lung consolidation. Small to moderate residual or re-accumulated right pleural effusion. Stable cardiomegaly and mediastinal contours. Left lung appears stable. Calcified aortic atherosclerosis. Visualized tracheal air column is within normal limits. No acute osseous abnormality identified. IMPRESSION: 1. Substantially decreased  right pneumothorax with small residual. 2. Right lower lobe pneumonia. Small to moderate residual or re-accumulated right pleural effusion. Electronically Signed: By: Lemmie Evens  [place] Crane M.D. On: 04/05/2021 06:11    Scheduled Meds:  sodium chloride   Intravenous Once   amiodarone  200 mg Oral Daily   atorvastatin  40 mg Oral QPM   busPIRone  7.5 mg Oral BID   chlorhexidine  15 mL Mouth Rinse BID   fluticasone furoate-vilanterol  1 puff Inhalation Daily   insulin aspart  0-15 Units Subcutaneous TID WC   magnesium oxide  400 mg Oral Daily   mouth rinse  15 mL Mouth Rinse q12n4p   midodrine  10 mg Oral TID WC   mupirocin ointment  1 application Nasal BID   pantoprazole  40 mg Oral BID   polyethylene glycol  17 g Oral Daily   sildenafil  40 mg Oral TID   torsemide  20 mg Oral Daily   umeclidinium bromide  1 puff Inhalation Daily   Continuous Infusions:  sodium chloride Stopped (04/05/21 2310)   piperacillin-tazobactam (ZOSYN)  IV 3.375 g (04/06/21 1511)   vancomycin 750 mg (04/06/21 1121)     LOS: 3 days   Time spent: 35 minutes.  Patrecia Pour, MD [facility] [email or web address] 04/06/2021, 4:39 PM

## 2021-04-07 ENCOUNTER — Inpatient Hospital Stay (HOSPITAL_COMMUNITY): Payer: Medicare Other

## 2021-04-07 DIAGNOSIS — R590 Localized enlarged lymph nodes: Secondary | ICD-10-CM

## 2021-04-07 DIAGNOSIS — I5033 Acute on chronic diastolic (congestive) heart failure: Secondary | ICD-10-CM | POA: Diagnosis not present

## 2021-04-07 DIAGNOSIS — J9621 Acute and chronic respiratory failure with hypoxia: Secondary | ICD-10-CM | POA: Diagnosis not present

## 2021-04-07 DIAGNOSIS — J449 Chronic obstructive pulmonary disease, unspecified: Secondary | ICD-10-CM | POA: Diagnosis not present

## 2021-04-07 DIAGNOSIS — I482 Chronic atrial fibrillation, unspecified: Secondary | ICD-10-CM | POA: Diagnosis not present

## 2021-04-07 DIAGNOSIS — J90 Pleural effusion, not elsewhere classified: Secondary | ICD-10-CM | POA: Diagnosis not present

## 2021-04-07 DIAGNOSIS — Z515 Encounter for palliative care: Secondary | ICD-10-CM

## 2021-04-07 LAB — BODY FLUID CULTURE W GRAM STAIN: Culture: NO GROWTH

## 2021-04-07 LAB — BASIC METABOLIC PANEL
Anion gap: 8 (ref 5–15)
BUN: 21 mg/dL (ref 8–23)
CO2: 32 mmol/L (ref 22–32)
Calcium: 7.8 mg/dL — ABNORMAL LOW (ref 8.9–10.3)
Chloride: 97 mmol/L — ABNORMAL LOW (ref 98–111)
Creatinine, Ser: 0.99 mg/dL (ref 0.61–1.24)
GFR, Estimated: >60 mL/min (ref >60)
Glucose, Bld: 160 mg/dL — ABNORMAL HIGH (ref 70–99)
Potassium: 4 mmol/L (ref 3.5–5.1)
Sodium: 137 mmol/L (ref 135–145)

## 2021-04-07 LAB — CBC
HCT: 27.4 % — ABNORMAL LOW (ref 39.0–52.0)
Hemoglobin: 8.1 g/dL — ABNORMAL LOW (ref 13.0–17.0)
MCH: 24.1 pg — ABNORMAL LOW (ref 26.0–34.0)
MCHC: 29.6 g/dL — ABNORMAL LOW (ref 30.0–36.0)
MCV: 81.5 fL (ref 80.0–100.0)
Platelets: 447 10*3/uL — ABNORMAL HIGH (ref 150–400)
RBC: 3.36 MIL/uL — ABNORMAL LOW (ref 4.22–5.81)
RDW: 18.8 % — ABNORMAL HIGH (ref 11.5–15.5)
WBC: 12.1 10*3/uL — ABNORMAL HIGH (ref 4.0–10.5)
nRBC: 0 % (ref 0.0–0.2)

## 2021-04-07 LAB — GLUCOSE, CAPILLARY
Glucose-Capillary: 162 mg/dL — ABNORMAL HIGH (ref 70–99)
Glucose-Capillary: 206 mg/dL — ABNORMAL HIGH (ref 70–99)
Glucose-Capillary: 274 mg/dL — ABNORMAL HIGH (ref 70–99)
Glucose-Capillary: 213 mg/dL — ABNORMAL HIGH (ref 70–99)

## 2021-04-07 MED ORDER — PHENOL 1.4 % MT LIQD
1.0000 | OROMUCOSAL | Status: DC | PRN
  Administered 2021-04-07: 1 via OROMUCOSAL
  Filled 2021-04-07: qty 177

## 2021-04-07 MED ORDER — MENTHOL 3 MG MT LOZG
1.0000 | LOZENGE | OROMUCOSAL | Status: DC | PRN
  Administered 2021-04-07: 3 mg via ORAL
  Filled 2021-04-07: qty 9

## 2021-04-07 NOTE — Progress Notes (Signed)
NAME:  Gary [name], MRN:  [id number], DOB:  August 22, 1950, LOS: 4 ADMISSION DATE:  03/23/2021, CONSULTATION DATE:  04/05/2021 REFERRING MD:  Dr. Karle Starch, ER, CHIEF COMPLAINT:  Short of breath   History of Present Illness:  70 yo male former smoker presented to [facility] ER with dyspnea and cough.  He has COPD and uses 6 liters oxygen.  He was to have thoracentesis for right pleural effusion by Dr. Valeta Harms on 04/04/21.  He had eliquis held in anticipation of this.  First noted to have pleural effusions in April 2022.  He was started on sildenafil for pulmonary hypertension during hospitalization in May 2022.  He had right thoracentesis done in May and July of 2022.  In ER he was started on supplemental oxygen and Bipap.  He was found to have anemia with hemoglobin 7.  He had EGD in April 2022 that showed large duodenal ulcer, short segments of Barrett's esophagus, and erosive gastritis.  He reports his symptoms started on 03/30/21.  He isn't aware of having blood in his stool or melena.  He denies chest pain or fever.  Pertinent  Medical History  A fib, DM type 2, HLD, HTN, Pulmonary hypertension, Barrett's esophagus, CAD, PUD with upper GI bleeding, COPD, +ANA, Pneumococcal pneumonia April 2022  Studies:  Rt thoracentesis 09/18/20 >> 1200 ml fluid, glucose 220, protein < 3, LDH 139, 790 cells (92% L), cytology negative Rt thoracentesis 12/04/20 >> glucose 174, protein 4, LDH 124, 810 cells (93% L), cytology negative Echo 12/04/20 >> EF 55 to 60%, mod LVH, severe RV systolic dysfx, mod RV enlargement, RVSP 59.5 mmHg, mod LA and RA dilation CT chest 12/31/20 >> atherosclerosis, 2.4 cm subcarinal LN, 2.3 cm Rt hilar LN, 2 cm precarinal LN, multiloculated Rt pleural effusion  Interim History / Subjective:  Overnight increasing oxygen requirements, now on high flow. PCCM called back to see if further interventions need to happen.   Objective   Blood pressure 99/63, pulse 79, temperature (!) 97.5 F  (36.4 C), temperature source Oral, resp. rate 20, height 5' 7"  (1.702 m), weight 68.8 kg, SpO2 92 %.    FiO2 (%):  [80 %-100 %] 80 %   Intake/Output Summary (Last 24 hours) at 04/07/2021 1346 Last data filed at 04/06/2021 1513 Gross per 24 hour  Intake 150 ml  Output --  Net 150 ml   Filed Weights   04/05/2021 1529 04/04/21 0414 04/04/21 1137  Weight: 68.4 kg 70.2 kg 68.8 kg    Examination: Gen: acutely and chronically ill appearing [id number], regular Resp: diminished bilaterally no wheezes or crackles Ext: no edema Abd: soft, nontender   Resolved Hospital Problem list     Assessment & Plan:  Gary [name] is a 70 y.o. 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  - recurrent effusion is exudative and cytology has been negative at least three times. Most recently tapped 11/16 and again 1/18. Chest xray shows slight worsening of right sided effusion since 11/18. I would not do a thoracentesis today in case he would need a pleurx this week.  - currently too unstable for bronchoscopy - on home oxygen 6LNC, but currently on high flow oxygen therapy - he is on empiric abx for respiratory pathogens. Reasonable to continue -  will talk with PCCM colleagues Monday regarding possibility of pleurx catheter placement if we think this will give him some relief. Otherwise I think comfort measures may be appropriate.   Pulmonary hypertension. - sildenafil held since he is hypotensive on midodrine now  Anemia with hx of duodenal ulcer. - to get PRBC transfusion on 11/16 per hospitalist team  Atrial fibrillation. - eliquis on hold due to GI bleed/anemia  Updated patient's daughter at bedside. Will discuss pleurx.  Otherwise agree with palliative measures given stated goals.   Lenice Llamas, MD [facility] [place]   Labs   CBC: Recent Labs  Lab 04/06/2021 [phone] 04/08/2021 1952 04/04/21 0434 04/06/21 0150 04/07/21 0207  WBC 14.0*  --  12.9* 12.2* 12.1*  NEUTROABS 12.5*  --   --   --   --   HGB 7.0* 8.6* 8.0* 8.2* 8.1*  HCT 23.3* 27.7* 26.3* 27.8* 27.4*  MCV 78.7*  --  80.9 81.5 81.5  PLT 467*  --  428* 426* 447*    Basic Metabolic Panel: Recent Labs  Lab 03/30/2021 0958 04/04/21 0434 04/05/21 0256 04/06/21 0150 04/07/21 0207  NA 134* 136 135 134* 137  K 4.6 3.8 3.7 4.2 4.0  CL 94* 99 94* 95* 97*  CO2 31 29 29 30  32  GLUCOSE 211* 140* 139* 151* 160*  BUN 30* 28* 22 22 21   CREATININE 0.85 0.91 0.99 1.05 0.99  CALCIUM 8.4* 8.3* 8.4* 8.2* 7.8*  MG  --  2.0 1.9  --   --    GFR: Estimated Creatinine Clearance: 63.1 mL/min (by C-G formula based on SCr of 0.99 mg/dL). Recent Labs  Lab 03/20/2021 0958 03/25/2021 1950 04/04/21 0434 04/06/21 0150 04/07/21 0207  PROCALCITON  --  0.24  --   --   --   WBC 14.0*  --  12.9* 12.2* 12.1*    Liver Function Tests: No results for input(s): AST, ALT, ALKPHOS, BILITOT, PROT, ALBUMIN in the last 168 hours. No results for input(s): LIPASE, AMYLASE in the last 168 hours. No results for input(s): AMMONIA in the last 168 hours.  ABG    Component Value Date/Time   PHART 7.484 (H) 04/08/2021 1003   PCO2ART 44.0 03/19/2021 1003   PO2ART 59.0 (L) 04/10/2021 1003   HCO3 32.4 (H) 03/25/2021 1003   TCO2 33 (H) 09/24/2020 1516   ACIDBASEDEF 4.0 (H) 09/07/2020 2236   O2SAT 89.3 03/24/2021 1003     Coagulation Profile: Recent Labs  Lab 04/11/2021 0958  INR 1.2    Cardiac Enzymes: No results for input(s): CKTOTAL, CKMB, CKMBINDEX, TROPONINI in the last 168 hours.  HbA1C: Hgb A1c MFr Bld  Date/Time Value Ref Range Status  04/14/2021 09:58 AM 6.9 (H) 4.8 - 5.6 % Final    Comment:    (NOTE) Pre diabetes:           5.7%-6.4%  Diabetes:              >6.4%  Glycemic control for   <7.0% adults with diabetes   01/11/2021 02:54 AM 7.4 (H) 4.8 - 5.6 % Final    Comment:    (NOTE) Pre diabetes:          5.7%-6.4%  Diabetes:              >6.4%  Glycemic control for   <7.0% adults with diabetes     CBG: Recent Labs  Lab 04/06/21 1250 04/06/21 1610 04/06/21 2319 04/07/21 0750 04/07/21 [place]  143* 239* 165* 162* 206*     

## 2021-04-07 NOTE — Progress Notes (Signed)
Daily Progress Note   Patient Name: Gary [name]       Date: 04/07/2021 DOB: Mar 08, 1951  Age: 70 y.o. MRN#: [id number] Attending Physician: Gary Pour, MD Primary Care Physician: Gary [name] Admit Date: 04/02/2021  Reason for Consultation/Follow-up: Establishing goals of care  Patient Profile/HPI:  70 y.o. male  with past medical history of a. Fib, pulmonary hypertension, CHF- cor pulmonale (R ventricular failure), DM2, hypothyroidism, anxiety with associated panic attacks, HLD, HTN, GERD, R lung mass with associated lymphadenopathy (have not been able to biopsy), recurrent pleural effusions s/p several thoracentises, chronic respiratory failure on home oxygen,  admitted on 04/04/2021 with worsening shortness of breath. Palliative medicine consulted for goals of care discussion in light of significant lung disease that continues to worsen.   Subjective: Patient is sitting up awake and alert. Spouse, Gary [name] is at bedside.  Reports he is feeling better and they believe he is improving. They are hopeful to work with PT tomorrow- patient is eager to get out of bed.    Physical Exam Vitals and nursing note reviewed.  Constitutional:      Appearance: He is ill-appearing.  Pulmonary:     Effort: Pulmonary effort is normal.     Comments: On heated high flow oxygen Neurological:     Mental Status: He is alert.            Vital Signs: BP 99/63   Pulse 79   Temp (!) 97.5 F (36.4 C) (Oral)   Resp 20   Ht 5' 7"  (1.702 m)   Wt 68.8 kg   SpO2 98%   BMI 23.76 kg/m  SpO2: SpO2: 98 % O2 Device: O2 Device: High Flow Nasal Cannula O2 Flow Rate: O2 Flow Rate (L/min): (S) 35 L/min  Intake/output summary:  Intake/Output Summary (Last 24 hours) at 04/07/2021 1507 Last data filed at  04/07/2021 1432 Gross per 24 hour  Intake 150 ml  Output 400 ml  Net -250 ml   LBM: Last BM Date: 04/05/21 Baseline Weight: Weight: 76.9 kg Most recent weight: Weight: 68.8 kg       Palliative Assessment/Data: PPS: 30%        Palliative Care Assessment & Plan    Assessment/Recommendations/Plan  Continue current plan PMT will continue to monitor and address GOC PRN Agree with  palliative pleurx- will not improve his prognosis- but will definitely help alleviate his symptoms Gary [name] unfortunately has very advanced lung disease, imaging and clinical picture are concerning for advancing lung cancer, currently requiring high flow oxygen GOC have been clarified so far as continuing to attempt to wean down HF oxygen so that he can go home- I discussed Hospice services with them on Friday- if he decompensates or fails to improve- will readdress GOC     Code Status: DNR  Prognosis:  < 6 months  Discharge Planning: To Be Determined - recommend Hospice at discharge   Thank you for allowing the Palliative Medicine Team to assist in the care of this patient.  Total time: 15 mins Prolonged billing: No     Greater than 50%  of this time was spent counseling and coordinating care related to the above assessment and plan.  Gary [name], AGNP-C Palliative Medicine   Please contact Palliative Medicine Team phone at [phone] for questions and concerns.

## 2021-04-07 NOTE — Progress Notes (Addendum)
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 Initial thoracentesis of loculated pleural effusion was unsuccessful. He was transferred to ICU at [facility] where 2L blood tinged fluid was drained with right thoracentesis on 11/17.   Assessment & Plan: Active Problems:   Atrial fibrillation, chronic (HCC)   Right bundle branch block   Recurrent pleural effusion on right   Hyponatremia   COPD, severe (HCC)   Acute and chronic respiratory failure with hypoxia (HCC)   Acute on chronic respiratory failure with hypoxia and hypercapnia (HCC)   Acute on chronic diastolic CHF (congestive heart failure) (HCC)  Acute on chronic hypoxic respiratory failure: Multifactorial with recurrent exudative pleural effusion, possible aspiration pneumonia, possible advanced pulmonary malignancy (persistent mediastinal adenopathy, recurrent exudative effusion though cytology has been negative x3 [5/3, 7/19, 7/23]) on chronic pulmonary HTN, COPD, CHF on baseline deconditioning.   - Pt currently requiring 40LPM HHFNC and BiPAP overnight. Appreciate pulmonary reevaluation. Pt is DNR, DNI. PCCM and palliative care following. Can use morphine prn air hunger, pain. - CXR this AM showed slight improvement in tiny apical R PTX, worsening slightly of right pleural effusion. May consider pleurx this week. - Incentive spirometry, pulmonary hygiene. - Continue vancomycin, zosyn - Continue inhaled therapies  PAF: Currently NSR.  - Continue amiodarone - Continue eliquis.   Hypotension:  - Continue midodrine. Hold parameters for  torsemide, slidenafil discussed.   Hx PUD:  - PPI BID  Chronic HFpEF:  - Tx HTN, continue baseline torsemide  Anemia of chronic disease: Roughly stable hgb in 8's. No bleeding reported.  Pulmonary HTN:  - Continue sildenafil.   T2DM: HbA1c 6.9%.  - Continue SSI  HLD:  - Continue statin  Anxiety:  - Continue buspar.    DVT prophylaxis: Eliquis Code Status: DNR Family Communication: None at bedside. PCCM spoke with daughter. Disposition Plan:  Status is: Inpatient  Remains inpatient appropriate because: Continued respiratory failure  Consultants:  PCCM Palliative  Procedures:  Right thoracentesis 11/17  Antimicrobials: Zosyn   Subjective: Was on BiPAP from 9pm to 7am and still requiring 40LPM at 80% to maintain saturations, though pt reports subjectively easier breathing at rest. Unable to exert himself due to dyspnea. Having some nasal and oropharyngeal irritation from high flow. No hemoptysis, no chest pain.   Objective: Vitals:   04/07/21 0742 04/07/21 0817 04/07/21 0940 04/07/21 1230  BP:  99/64 96/63 99/63   Pulse:  88 82 79  Resp:  20    Temp:  (!) 97.5 F (36.4 C)    TempSrc:  Oral    SpO2: 97% 96% 98% 92%  Weight:      Height:        Intake/Output Summary (Last 24 hours) at 04/07/2021 1401 Last data filed at 04/06/2021 1513 Gross per 24 hour  Intake 150 ml  Output --  Net 150 ml   Filed Weights   04/15/2021 1529 04/04/21 0414 04/04/21 1137  Weight: 68.4 kg 70.2 kg 68.8 kg   Gen: Chronically ill-appearing male in no distress Pulm: Tachypneic without accessory muscle use, diminished R > L without crackles or wheezes.   CV: Regular rate and rhythm. No murmur,  rub, or gallop. No JVD, no pitting dependent edema. GI: Abdomen soft, non-tender, non-distended, with normoactive bowel sounds.  Ext: Warm, no deformities Skin: No new rashes, lesions or ulcers on visualized skin. Neuro: Alert and oriented. No focal neurological deficits. Psych:  Judgement and insight appear fair. Mood euthymic & affect congruent. Behavior is appropriate.    Data Reviewed: I have personally reviewed following labs and imaging studies  CBC: Recent Labs  Lab 04/01/2021 0958 04/11/2021 1952 04/04/21 0434 04/06/21 0150 04/07/21 0207  WBC 14.0*  --  12.9* 12.2* 12.1*  NEUTROABS 12.5*  --   --   --   --   HGB 7.0* 8.6* 8.0* 8.2* 8.1*  HCT 23.3* 27.7* 26.3* 27.8* 27.4*  MCV 78.7*  --  80.9 81.5 81.5  PLT 467*  --  428* 426* 680*   Basic Metabolic Panel: Recent Labs  Lab 04/02/2021 0958 04/04/21 0434 04/05/21 0256 04/06/21 0150 04/07/21 0207  NA 134* 136 135 134* 137  K 4.6 3.8 3.7 4.2 4.0  CL 94* 99 94* 95* 97*  CO2 31 29 29 30  32  GLUCOSE 211* 140* 139* 151* 160*  BUN 30* 28* 22 22 21   CREATININE 0.85 0.91 0.99 1.05 0.99  CALCIUM 8.4* 8.3* 8.4* 8.2* 7.8*  MG  --  2.0 1.9  --   --    GFR: Estimated Creatinine Clearance: 63.1 mL/min (by C-G formula based on SCr of 0.99 mg/dL). Liver Function Tests: No results for input(s): AST, ALT, ALKPHOS, BILITOT, PROT, ALBUMIN in the last 168 hours. No results for input(s): LIPASE, AMYLASE in the last 168 hours. No results for input(s): AMMONIA in the last 168 hours. Coagulation Profile: Recent Labs  Lab 04/11/2021 0958  INR 1.2   Cardiac Enzymes: No results for input(s): CKTOTAL, CKMB, CKMBINDEX, TROPONINI in the last 168 hours. BNP (last 3 results) No results for input(s): PROBNP in the last 8760 hours. HbA1C: No results for input(s): HGBA1C in the last 72 hours. CBG: Recent Labs  Lab 04/06/21 1250 04/06/21 1610 04/06/21 2319 04/07/21 0750 04/07/21 1223  GLUCAP 143* 239* 165* 162* 206*   Lipid Profile: No results for input(s): CHOL, HDL, LDLCALC, TRIG, CHOLHDL, LDLDIRECT in the last 72 hours. Thyroid Function Tests: No results for input(s): TSH, T4TOTAL, FREET4, T3FREE, THYROIDAB in the last 72 hours. Anemia Panel: Recent Labs    04/04/21 1612  FERRITIN 42  TIBC 272  IRON 201*    Urine analysis:    Component Value Date/Time   COLORURINE YELLOW 01/10/2021 1400   APPEARANCEUR CLEAR 01/10/2021 1400   LABSPEC <1.005 (L) 01/10/2021 1400   PHURINE 7.0 01/10/2021 1400   GLUCOSEU >=500 (A) 01/10/2021 1400   HGBUR NEGATIVE 01/10/2021 1400   BILIRUBINUR NEGATIVE 01/10/2021 1400   KETONESUR NEGATIVE 01/10/2021 1400   PROTEINUR NEGATIVE 01/10/2021 1400   NITRITE NEGATIVE 01/10/2021 1400   LEUKOCYTESUR NEGATIVE 01/10/2021 1400   Recent Results (from the past 240 hour(s))  Resp Panel by RT-PCR (Flu A&B, Covid) Nasopharyngeal Swab     Status: None   Collection Time: 04/11/2021 10:11 AM   Specimen: Nasopharyngeal Swab; Nasopharyngeal(NP) swabs in vial transport medium  Result Value Ref Range Status   SARS Coronavirus 2 by RT PCR NEGATIVE NEGATIVE Final    Comment: (NOTE) SARS-CoV-2 target nucleic acids are NOT DETECTED.  The SARS-CoV-2 RNA is generally detectable in upper respiratory specimens during the acute phase of infection. The lowest concentration of SARS-CoV-2 viral copies this assay can detect is 138 copies/mL. A negative result  does not preclude SARS-Cov-2 infection and should not be used as the sole basis for treatment or other patient management decisions. A negative result may occur with  improper specimen collection/handling, submission of specimen other than nasopharyngeal swab, presence of viral mutation(s) within the areas targeted by this assay, and inadequate number of viral copies(<138 copies/mL). A negative result must be combined with clinical observations, patient history, and epidemiological information. The expected result is Negative.  Fact Sheet for Patients:  EntrepreneurPulse.com.au  Fact Sheet for Healthcare Providers:  IncredibleEmployment.be  This test is no t yet approved or cleared by the [place] FDA and  has been authorized for detection and/or diagnosis of SARS-CoV-2 by FDA under an  Emergency Use Authorization (EUA). This EUA will remain  in effect (meaning this test can be used) for the duration of the COVID-19 declaration under Section 564(b)(1) of the Act, 21 U.S.C.section 360bbb-3(b)(1), unless the authorization is terminated  or revoked sooner.       Influenza A by PCR NEGATIVE NEGATIVE Final   Influenza B by PCR NEGATIVE NEGATIVE Final    Comment: (NOTE) The Xpert Xpress SARS-CoV-2/FLU/RSV plus assay is intended as an aid in the diagnosis of influenza from Nasopharyngeal swab specimens and should not be used as a sole basis for treatment. Nasal washings and aspirates are unacceptable for Xpert Xpress SARS-CoV-2/FLU/RSV testing.  Fact Sheet for Patients: EntrepreneurPulse.com.au  Fact Sheet for Healthcare Providers: IncredibleEmployment.be  This test is not yet approved or cleared by the [place] FDA and has been authorized for detection and/or diagnosis of SARS-CoV-2 by FDA under an Emergency Use Authorization (EUA). This EUA will remain in effect (meaning this test can be used) for the duration of the COVID-19 declaration under Section 564(b)(1) of the Act, 21 U.S.C. section 360bbb-3(b)(1), unless the authorization is terminated or revoked.  Performed at [facility], [address], [place], [place] [address]   MRSA Next Gen by PCR, Nasal     Status: Abnormal   Collection Time: 03/31/2021  4:08 PM   Specimen: Nasal Mucosa; Nasal Swab  Result Value Ref Range Status   MRSA by PCR Next Gen DETECTED (A) NOT DETECTED Final    Comment: RESULT CALLED TO, READ BACK BY AND VERIFIED WITH: ASHLEY SHELTON @ 3646 ON 03/21/2021 C VARNER (NOTE) The GeneXpert MRSA Assay (FDA approved for NASAL specimens only), is one component of a comprehensive MRSA colonization surveillance program. It is not intended to diagnose MRSA infection nor to guide or monitor treatment for MRSA infections. Test performance is not FDA approved in  patients less than 21 years old. Performed at [facility], [address] [place], [place] [address]   Culture, body fluid w Gram Stain-bottle     Status: None (Preliminary result)   Collection Time: 04/14/2021  4:18 PM   Specimen: Pleura  Result Value Ref Range Status   Specimen Description PLEURAL  Final   Special Requests   Final    BOTTLES DRAWN AEROBIC AND ANAEROBIC Blood Culture adequate volume   Culture   Final    NO GROWTH 4 DAYS Performed at [facility], [address], [place],  [address]    Report Status PENDING  Incomplete  Gram stain     Status: None   Collection Time: 03/19/2021  4:18 PM   Specimen: Pleura  Result Value Ref Range Status   Specimen Description PLEURAL  Final   Special Requests NONE  Final   Gram Stain   Final    WBC  PRESENT,BOTH PMN AND MONONUCLEAR NO ORGANISMS SEEN CYTOSPIN SMEAR Performed at [facility], [address], [place], [place] [address]    Report Status 03/22/2021 FINAL  Final  Culture, blood (Routine X 2) w Reflex to ID Panel     Status: None (Preliminary result)   Collection Time: 04/15/2021  7:50 PM   Specimen: BLOOD RIGHT ARM  Result Value Ref Range Status   Specimen Description BLOOD RIGHT ARM  Final   Special Requests   Final    BOTTLES DRAWN AEROBIC AND ANAEROBIC Blood Culture results may not be optimal due to an excessive volume of blood received in culture bottles   Culture   Final    NO GROWTH 4 DAYS Performed at [facility], [address], [place], [place] [address]    Report Status PENDING  Incomplete  Culture, blood (Routine X 2) w Reflex to ID Panel     Status: None (Preliminary result)   Collection Time: 04/13/2021  7:50 PM   Specimen: BLOOD LEFT HAND  Result Value Ref Range Status   Specimen Description BLOOD LEFT HAND  Final   Special Requests   Final    BOTTLES DRAWN AEROBIC AND ANAEROBIC Blood Culture adequate volume   Culture   Final    NO GROWTH 4 DAYS Performed at [facility], [address]  [place], [place] [address]    Report Status PENDING  Incomplete  Body fluid culture w Gram Stain     Status: None (Preliminary result)   Collection Time: 04/04/21  1:02 PM   Specimen: Body Fluid  Result Value Ref Range Status   Specimen Description FLUID  Final   Special Requests  PLEURAL  Final   Gram Stain   Final    ABUNDANT WBC PRESENT, PREDOMINANTLY MONONUCLEAR NO ORGANISMS SEEN    Culture   Final    NO GROWTH 3 DAYS Performed at [facility], [address], [place], [place] [address]    Report Status PENDING  Incomplete  MRSA Next Gen by PCR, Nasal     Status: Abnormal   Collection Time: 04/04/21  3:52 PM   Specimen: Nasal Mucosa; Nasal Swab  Result Value Ref Range Status   MRSA by PCR Next Gen DETECTED (A) NOT DETECTED Final    Comment: RESULT CALLED TO, READ BACK BY AND VERIFIED WITH: Brandt Loosen RN [phone] 04/04/21 A BROWNING (NOTE) The GeneXpert MRSA Assay (FDA approved for NASAL specimens only), is one component of a comprehensive MRSA colonization surveillance program. It is not intended to diagnose MRSA infection nor to guide or monitor treatment for MRSA infections. Test performance is not FDA approved in patients less than 64 years old. Performed at [facility], [place] [address], [place], [place] [address]       Radiology Studies: DG CHEST PORT 1 VIEW  Result Date: 04/07/2021 CLINICAL DATA:  Pneumonia.  Pleural effusion. EXAM: PORTABLE CHEST 1 VIEW COMPARISON:  04/05/2021 FINDINGS: 0616 hours. Low lung volumes. The cardio pericardial silhouette is enlarged. There is pulmonary vascular congestion without overt pulmonary edema. Right base collapse/consolidation with pleural effusion again noted. The tiny right-sided pneumothorax is similar. Telemetry leads overlie the chest. IMPRESSION: 1. Right base collapse/consolidation with right pleural effusion similar to minimally progressive in the interval. 2. Tiny right apical pneumothorax is similar to prior.  Electronically Signed   By: Misty Stanley M.D.   On: 04/07/2021 08:47    Scheduled Meds:  amiodarone  200 mg Oral Daily  atorvastatin  40 mg Oral QPM   busPIRone  7.5 mg Oral BID   chlorhexidine  15 mL Mouth Rinse BID   fluticasone furoate-vilanterol  1 puff Inhalation Daily   insulin aspart  0-15 Units Subcutaneous TID WC   ipratropium  2 spray Each Nare BID   magnesium oxide  400 mg Oral Daily   mouth rinse  15 mL Mouth Rinse q12n4p   midodrine  10 mg Oral TID WC   mupirocin ointment  1 application Nasal BID   pantoprazole  40 mg Oral BID   polyethylene glycol  17 g Oral Daily   torsemide  20 mg Oral Daily   umeclidinium bromide  1 puff Inhalation Daily   Continuous Infusions:  sodium chloride Stopped (04/05/21 2310)   piperacillin-tazobactam (ZOSYN)  IV 3.375 g (04/07/21 0535)   vancomycin 750 mg (04/07/21 0959)     LOS: 4 days   Time spent: 35 minutes.  Patrecia Pour, MD [facility] [email or web address] 04/07/2021, 2:01 PM

## 2021-04-08 ENCOUNTER — Ambulatory Visit: Payer: Medicare Other | Admitting: Pulmonary Disease

## 2021-04-08 DIAGNOSIS — J9621 Acute and chronic respiratory failure with hypoxia: Secondary | ICD-10-CM | POA: Diagnosis not present

## 2021-04-08 DIAGNOSIS — J90 Pleural effusion, not elsewhere classified: Secondary | ICD-10-CM | POA: Diagnosis not present

## 2021-04-08 DIAGNOSIS — I5033 Acute on chronic diastolic (congestive) heart failure: Secondary | ICD-10-CM | POA: Diagnosis not present

## 2021-04-08 DIAGNOSIS — I482 Chronic atrial fibrillation, unspecified: Secondary | ICD-10-CM | POA: Diagnosis not present

## 2021-04-08 LAB — CULTURE, BODY FLUID W GRAM STAIN -BOTTLE
Culture: NO GROWTH
Special Requests: ADEQUATE

## 2021-04-08 LAB — CULTURE, BLOOD (ROUTINE X 2)
Culture: NO GROWTH
Culture: NO GROWTH
Special Requests: ADEQUATE

## 2021-04-08 LAB — GLUCOSE, CAPILLARY
Glucose-Capillary: 289 mg/dL — ABNORMAL HIGH (ref 70–99)
Glucose-Capillary: 224 mg/dL — ABNORMAL HIGH (ref 70–99)
Glucose-Capillary: 112 mg/dL — ABNORMAL HIGH (ref 70–99)
Glucose-Capillary: 176 mg/dL — ABNORMAL HIGH (ref 70–99)

## 2021-04-08 MED ORDER — INSULIN GLARGINE-YFGN 100 UNIT/ML ~~LOC~~ SOLN
10.0000 [IU] | Freq: Every day | SUBCUTANEOUS | Status: DC
Start: 2021-04-08 — End: 2021-04-19
  Administered 2021-04-08 – 2021-04-18 (×11): 10 [IU] via SUBCUTANEOUS
  Filled 2021-04-08 (×12): qty 0.1

## 2021-04-08 MED ORDER — TORSEMIDE 20 MG PO TABS
20.0000 mg | ORAL_TABLET | Freq: Two times a day (BID) | ORAL | Status: DC
  Administered 2021-04-09 – 2021-04-18 (×19): 20 mg via ORAL
  Filled 2021-04-08 (×21): qty 1

## 2021-04-08 MED ORDER — APIXABAN 5 MG PO TABS
5.0000 mg | ORAL_TABLET | Freq: Two times a day (BID) | ORAL | Status: DC
  Administered 2021-04-08 – 2021-04-09 (×2): 5 mg via ORAL
  Filled 2021-04-08 (×3): qty 1

## 2021-04-08 MED ORDER — SILDENAFIL CITRATE 20 MG PO TABS
20.0000 mg | ORAL_TABLET | Freq: Three times a day (TID) | ORAL | Status: DC
  Administered 2021-04-08 – 2021-04-18 (×23): 20 mg via ORAL
  Filled 2021-04-08 (×30): qty 1

## 2021-04-08 NOTE — Progress Notes (Signed)
Inpatient Diabetes Program Recommendations  AACE/ADA: New Consensus Statement on Inpatient Glycemic Control (2015)  Target Ranges:  Prepandial:   less than 140 mg/dL      Peak postprandial:   less than 180 mg/dL (1-2 hours)      Critically ill patients:  140 - 180 mg/dL   Lab Results  Component Value Date   GLUCAP 224 (H) 04/08/2021   HGBA1C 6.9 (H) 04/15/2021    Review of Glycemic Control  Latest Reference Range & Units 04/07/21 07:50 04/07/21 12:23 04/07/21 17:09 04/07/21 21:20 04/08/21 08:31 04/08/21 11:25  Glucose-Capillary 70 - 99 mg/dL 162 (H) 206 (H) 274 (H) 213 (H) 289 (H) 224 (H)   Diabetes history: DM 2 Outpatient Diabetes medications: Jardiance 10 mg, Metformin 1000 mg bid Current orders for Inpatient glycemic control:  Novolog 0-15 units tid   A1c 6.9% on 11/16  Inpatient Diabetes Program Recommendations:    - Start Semglee 10 units  Thanks,  Tama Headings RN, MSN, BC-ADM Inpatient Diabetes Coordinator Team Pager [phone] (8a-5p)

## 2021-04-08 NOTE — Progress Notes (Signed)
NAME:  Gary [name], MRN:  [id number], DOB:  [birth year], LOS: 5 ADMISSION DATE:  04/09/2021, CONSULTATION DATE:  03/19/2021 REFERRING MD:  Dr. Karle Starch, ER, CHIEF COMPLAINT:  Short of breath   History of Present Illness:  70 yo male former smoker presented to [facility] ER with dyspnea and cough.  He has COPD and uses 6 liters oxygen.  He was to have thoracentesis for right pleural effusion by Dr. Valeta Harms on 04/04/21.  He had eliquis held in anticipation of this.  First noted to have pleural effusions in April 2022.  He was started on sildenafil for pulmonary hypertension during hospitalization in May 2022.  He had right thoracentesis done in May and July of 2022.  In ER he was started on supplemental oxygen and Bipap.  He was found to have anemia with hemoglobin 7.  He had EGD in April 2022 that showed large duodenal ulcer, short segments of Barrett's esophagus, and erosive gastritis.  He reports his symptoms started on 03/30/21.  He isn't aware of having blood in his stool or melena.  He denies chest pain or fever.  Pertinent  Medical History  A fib, DM type 2, HLD, HTN, Pulmonary hypertension, Barrett's esophagus, CAD, PUD with upper GI bleeding, COPD, +ANA, Pneumococcal pneumonia April 2022  Studies:  Rt thoracentesis 09/18/20 >> 1200 ml fluid, glucose 220, protein < 3, LDH 139, 790 cells (92% L), cytology negative Rt thoracentesis 12/04/20 >> glucose 174, protein 4, LDH 124, 810 cells (93% L), cytology negative Echo 12/04/20 >> EF 55 to 60%, mod LVH, severe RV systolic dysfx, mod RV enlargement, RVSP 59.5 mmHg, mod LA and RA dilation CT chest 12/31/20 >> atherosclerosis, 2.4 cm subcarinal LN, 2.3 cm Rt hilar LN, 2 cm precarinal LN, multiloculated Rt pleural effusion  Interim History / Subjective:   Slow worsening SOB over last 2 days, dyspnea at rest. Worse in the evenings Currently 35L/min, 0.70 fiO2  Objective   Blood pressure (!) 95/56, pulse 62, temperature 98.4 F (36.9 C), temperature  source Oral, resp. rate 18, height 5' 7"  (1.702 m), weight 68.8 kg, SpO2 92 %.    FiO2 (%):  [70 %-90 %] 90 %   Intake/Output Summary (Last 24 hours) at 04/08/2021 1710 Last data filed at 04/08/2021 1649 Gross per 24 hour  Intake 621.3 ml  Output 800 ml  Net -178.7 ml   Filed Weights   03/25/2021 1529 04/04/21 0414 04/04/21 1137  Weight: 68.4 kg 70.2 kg 68.8 kg    Examination: Gen: ill appearing elderly man, NAD on O2 CV: regular, tachy, 120's Resp: decreased B basilar BS Ext: trace LE edema Abd: non-distended   Resolved Hospital Problem list     Assessment & Plan:  Gary [name] is a 70 y.o. 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  - I believe he is headed for comfort care, principal dx being presume lung CA. There may be some palliative benefit to pleurx catheter for management of dyspnea. Have discussed with Dr  Clark. Agree that there may be benefit - he would need access to drainage bottles, would need teaching for family regarding drainage.   Pulmonary hypertension. - sildenafil held since he is hypotensive, on midodrine now  Anemia with hx of duodenal ulcer. - follow CBC post PRBC  Atrial fibrillation. - eliquis on hold due to GI bleed/anemia   Baltazar Apo, MD, PhD 04/08/2021, 5:20 PM [facility] [phone] or if no answer before 7:00PM call [phone] For any issues after 7:00PM please call eLink  [phone]    Labs   CBC: Recent Labs  Lab 03/28/2021 0958 03/31/2021 1952 04/04/21 0434 04/06/21 0150 04/07/21 0207  WBC 14.0*  --  12.9* 12.2* 12.1*  NEUTROABS 12.5*  --   --   --   --   HGB 7.0* 8.6* 8.0* 8.2* 8.1*  HCT 23.3* 27.7* 26.3* 27.8* 27.4*  MCV 78.7*  --  80.9 81.5 81.5  PLT 467*  --  428* 426* 447*    Basic Metabolic Panel: Recent Labs  Lab 04/15/2021 0958 04/04/21 0434 04/05/21 0256 04/06/21 0150 04/07/21 0207  NA 134* 136 135 134* 137  K 4.6 3.8 3.7 4.2 4.0  CL 94* 99 94* 95* 97*  CO2 31 29 29 30  32  GLUCOSE 211* 140* 139* 151* 160*  BUN 30* 28* 22 22 21   CREATININE 0.85 0.91 0.99 1.05 0.99  CALCIUM 8.4* 8.3* 8.4* 8.2* 7.8*  MG  --  2.0 1.9  --   --    GFR: Estimated Creatinine Clearance: 63.1 mL/min (by C-G formula based on SCr of 0.99 mg/dL). Recent Labs  Lab 03/31/2021 0958 03/28/2021 1950 04/04/21 0434 04/06/21 0150 04/07/21 0207  PROCALCITON  --  0.24  --   --   --   WBC 14.0*  --  12.9* 12.2* 12.1*    Liver Function Tests: No results for input(s): AST, ALT, ALKPHOS, BILITOT, PROT, ALBUMIN in the last 168 hours. No results for input(s): LIPASE, AMYLASE in the last 168 hours. No results for input(s): AMMONIA in the last 168 hours.  ABG    Component Value Date/Time   PHART 7.484 (H) 03/29/2021 1003   PCO2ART 44.0 04/08/2021 1003   PO2ART 59.0 (L) 04/17/2021 1003   HCO3 32.4 (H) 04/11/2021 1003   TCO2 33 (H) 09/24/2020 1516   ACIDBASEDEF 4.0 (H) 09/07/2020 2236   O2SAT 89.3 03/20/2021 1003     Coagulation Profile: Recent Labs  Lab 03/21/2021 0958  INR 1.2    Cardiac Enzymes: No results for input(s): CKTOTAL, CKMB, CKMBINDEX, TROPONINI in the last 168 hours.  HbA1C: Hgb A1c MFr Bld  Date/Time Value Ref Range Status  04/02/2021 09:58 AM 6.9 (H) 4.8 - 5.6 % Final    Comment:    (NOTE) Pre diabetes:          5.7%-6.4%  Diabetes:              >6.4%  Glycemic control for   <7.0% adults with diabetes   01/11/2021 02:54 AM  7.4 (H) 4.8 - 5.6 % Final    Comment:    (NOTE) Pre diabetes:          5.7%-6.4%  Diabetes:              >6.4%  Glycemic control for   <7.0% adults with diabetes     CBG: Recent Labs  Lab 04/07/21 1709 04/07/21 2120 04/08/21 0831 04/08/21 1125 04/08/21 1628  GLUCAP 274* 213* 289* 224* 112*

## 2021-04-08 NOTE — Care Management Important Message (Signed)
Important Message  Patient Details  Name: Gary [name] MRN: [id number] Date of Birth: 04/16/1951   Medicare Important Message Given:  Yes     Shelda Altes 04/08/2021, 10:08 AM

## 2021-04-08 NOTE — Progress Notes (Signed)
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 Initial thoracentesis of loculated pleural effusion was unsuccessful. He was transferred to ICU at [facility] where 2L blood tinged fluid was drained with right thoracentesis on 11/17. Respiratory distress has continued despite diuretic and antibiotics after thoracentesis. Plan is tentatively to consider palliative pleur-x placement.    Assessment & Plan: Active Problems:   Atrial fibrillation, chronic (HCC)   Right bundle branch block   Recurrent pleural effusion on right   Hyponatremia   COPD, severe (HCC)   Acute and chronic respiratory failure with hypoxia (HCC)   Acute on chronic respiratory failure with hypoxia and hypercapnia (HCC)   Acute on chronic diastolic CHF (congestive heart failure) (HCC)  Acute on chronic hypoxic respiratory failure: Multifactorial with recurrent exudative pleural effusion, possible aspiration pneumonia, possible advanced pulmonary malignancy (persistent mediastinal adenopathy, recurrent exudative effusion though cytology has been negative x3 [5/3, 7/19, 7/23]) on chronic pulmonary HTN, COPD, CHF on baseline deconditioning.   - Pt currently requiring 45L at 70% HHFNC, BiPAP overnight. PCCM and palliative continue to follow.  - To assist with symptom burden, PCCM considering pleurx placement. Can give morphine, e.g. if required for air hunger.  - Cytology from thora this admit is pending. - Incentive spirometry, pulmonary hygiene. - Continue vancomycin, zosyn - Continue inhaled therapies - Will give PM dose of torsemide with  improved BP and crackles.  PAF: Currently NSR.  - Continue amiodarone - Continue eliquis.   Hypotension:  - Continue midodrine. Hold parameters for torsemide, slidenafil discussed.   Hx PUD:  - PPI BID  Chronic HFpEF:  - Tx HTN, continue baseline torsemide, additional dose this PM.  Anemia of chronic disease: Roughly stable hgb in 8's. No bleeding reported.  Pulmonary HTN:  - Continue sildenafil.   T2DM: HbA1c 6.9%.  - Continue SSI - Add basal  HLD:  - Continue statin  Anxiety:  - Continue buspar.    DVT prophylaxis: Eliquis Code Status: DNR Family Communication: None at bedside. Have been speaking with spouse at bedside daily.  Disposition Plan:  Status is: Inpatient  Remains inpatient appropriate because: Continued respiratory failure  Consultants:  PCCM Palliative  Procedures:  Right thoracentesis 11/17  Antimicrobials: Vancomycin, zosyn   Subjective: Feels subjectively better breathing, eager to try to get OOB. Remains on HHF. throat irritation is improved with spray as ordered. No chest pain at this time.   Objective: Vitals:   04/08/21 0014 04/08/21 0501 04/08/21 0819 04/08/21 0822  BP:  120/76  103/69  Pulse: 77 79  83  Resp:  18  18  Temp:  99 F (37.2 C)  (!) 97.5 F (36.4 C)  TempSrc:  Axillary  Oral  SpO2: 99% 100% 94% 93%  Weight:      Height:        Intake/Output Summary (Last 24 hours) at 04/08/2021 1405 Last data filed at 04/07/2021 2017 Gross per 24 hour  Intake --  Output 1200 ml  Net -1200 ml   Filed Weights   04/07/2021 1529 04/04/21 0414 04/04/21 1137  Weight: 68.4 kg 70.2 kg 68.8 kg   Gen: Ill-appearing male in no distress Pulm:  Tachypneic with HHF, mouth breathing while sleeping, crackles R > L diffusely. Diminished at right base particularly. CV: Regular borderline tachycardia without murmur, rub, or gallop. Mildly elevated JVP no pitting dependent edema. GI: Abdomen soft, non-tender, non-distended, with normoactive  bowel sounds.  Ext: Warm, no deformities Skin: No new rashes, lesions or ulcers on visualized skin. Neuro: Drowsy but rousable and oriented, without focal neurological deficits. Psych: Judgement and insight appear fair. Mood euthymic & affect congruent. Behavior is appropriate.     Data Reviewed: I have personally reviewed following labs and imaging studies  CBC: Recent Labs  Lab 03/31/2021 0958 04/05/2021 1952 04/04/21 0434 04/06/21 0150 04/07/21 0207  WBC 14.0*  --  12.9* 12.2* 12.1*  NEUTROABS 12.5*  --   --   --   --   HGB 7.0* 8.6* 8.0* 8.2* 8.1*  HCT 23.3* 27.7* 26.3* 27.8* 27.4*  MCV 78.7*  --  80.9 81.5 81.5  PLT 467*  --  428* 426* 885*   Basic Metabolic Panel: Recent Labs  Lab 04/05/2021 0958 04/04/21 0434 04/05/21 0256 04/06/21 0150 04/07/21 0207  NA 134* 136 135 134* 137  K 4.6 3.8 3.7 4.2 4.0  CL 94* 99 94* 95* 97*  CO2 31 29 29 30  32  GLUCOSE 211* 140* 139* 151* 160*  BUN 30* 28* 22 22 21   CREATININE 0.85 0.91 0.99 1.05 0.99  CALCIUM 8.4* 8.3* 8.4* 8.2* 7.8*  MG  --  2.0 1.9  --   --    GFR: Estimated Creatinine Clearance: 63.1 mL/min (by C-G formula based on SCr of 0.99 mg/dL). Liver Function Tests: No results for input(s): AST, ALT, ALKPHOS, BILITOT, PROT, ALBUMIN in the last 168 hours. No results for input(s): LIPASE, AMYLASE in the last 168 hours. No results for input(s): AMMONIA in the last 168 hours. Coagulation Profile: Recent Labs  Lab 04/15/2021 0958  INR 1.2   Cardiac Enzymes: No results for input(s): CKTOTAL, CKMB, CKMBINDEX, TROPONINI in the last 168 hours. BNP (last 3 results) No results for input(s): PROBNP in the last 8760 hours. HbA1C: No results for input(s): HGBA1C in the last 72 hours. CBG: Recent Labs  Lab 04/07/21 1223 04/07/21 1709 04/07/21 2120 04/08/21 0831 04/08/21 1125  GLUCAP 206* 274* 213* 289* 224*   Lipid Profile: No results for input(s): CHOL, HDL, LDLCALC, TRIG, CHOLHDL, LDLDIRECT in the last 72  hours. Thyroid Function Tests: No results for input(s): TSH, T4TOTAL, FREET4, T3FREE, THYROIDAB in the last 72 hours. Anemia Panel: No results for input(s): VITAMINB12, FOLATE, FERRITIN, TIBC, IRON, RETICCTPCT in the last 72 hours.  Urine analysis:    Component Value Date/Time   COLORURINE YELLOW 01/10/2021 1400   APPEARANCEUR CLEAR 01/10/2021 1400   LABSPEC <1.005 (L) 01/10/2021 1400   PHURINE 7.0 01/10/2021 1400   GLUCOSEU >=500 (A) 01/10/2021 1400   HGBUR NEGATIVE 01/10/2021 1400   BILIRUBINUR NEGATIVE 01/10/2021 1400   KETONESUR NEGATIVE 01/10/2021 1400   PROTEINUR NEGATIVE 01/10/2021 1400   NITRITE NEGATIVE 01/10/2021 1400   LEUKOCYTESUR NEGATIVE 01/10/2021 1400   Recent Results (from the past 240 hour(s))  Resp Panel by RT-PCR (Flu A&B, Covid) Nasopharyngeal Swab     Status: None   Collection Time: 04/02/2021 10:11 AM   Specimen: Nasopharyngeal Swab; Nasopharyngeal(NP) swabs in vial transport medium  Result Value Ref Range Status   SARS Coronavirus 2 by RT PCR NEGATIVE NEGATIVE Final    Comment: (NOTE) SARS-CoV-2 target nucleic acids are NOT DETECTED.  The SARS-CoV-2 RNA is generally detectable in upper  respiratory specimens during the acute phase of infection. The lowest concentration of SARS-CoV-2 viral copies this assay can detect is 138 copies/mL. A negative result does not preclude SARS-Cov-2 infection and should not be used as the sole basis for treatment or other patient management decisions. A negative result may occur with  improper specimen collection/handling, submission of specimen other than nasopharyngeal swab, presence of viral mutation(s) within the areas targeted by this assay, and inadequate number of viral copies(<138 copies/mL). A negative result must be combined with clinical observations, patient history, and epidemiological information. The expected result is Negative.  Fact Sheet for Patients:  EntrepreneurPulse.com.au  Fact  Sheet for Healthcare Providers:  IncredibleEmployment.be  This test is no t yet approved or cleared by the [place] FDA and  has been authorized for detection and/or diagnosis of SARS-CoV-2 by FDA under an Emergency Use Authorization (EUA). This EUA will remain  in effect (meaning this test can be used) for the duration of the COVID-19 declaration under Section 564(b)(1) of the Act, 21 U.S.C.section 360bbb-3(b)(1), unless the authorization is terminated  or revoked sooner.       Influenza A by PCR NEGATIVE NEGATIVE Final   Influenza B by PCR NEGATIVE NEGATIVE Final    Comment: (NOTE) The Xpert Xpress SARS-CoV-2/FLU/RSV plus assay is intended as an aid in the diagnosis of influenza from Nasopharyngeal swab specimens and should not be used as a sole basis for treatment. Nasal washings and aspirates are unacceptable for Xpert Xpress SARS-CoV-2/FLU/RSV testing.  Fact Sheet for Patients: EntrepreneurPulse.com.au  Fact Sheet for Healthcare Providers: IncredibleEmployment.be  This test is not yet approved or cleared by the [place] FDA and has been authorized for detection and/or diagnosis of SARS-CoV-2 by FDA under an Emergency Use Authorization (EUA). This EUA will remain in effect (meaning this test can be used) for the duration of the COVID-19 declaration under Section 564(b)(1) of the Act, 21 U.S.C. section 360bbb-3(b)(1), unless the authorization is terminated or revoked.  Performed at [facility], [address] [place], [place] [address]   MRSA Next Gen by PCR, Nasal     Status: Abnormal   Collection Time: 04/01/2021  4:08 PM   Specimen: Nasal Mucosa; Nasal Swab  Result Value Ref Range Status   MRSA by PCR Next Gen DETECTED (A) NOT DETECTED Final    Comment: RESULT CALLED TO, READ BACK BY AND VERIFIED WITH: ASHLEY SHELTON @ [phone] ON 03/30/2021 C VARNER (NOTE) The GeneXpert MRSA Assay (FDA approved for NASAL  specimens only), is one component of a comprehensive MRSA colonization surveillance program. It is not intended to diagnose MRSA infection nor to guide or monitor treatment for MRSA infections. Test performance is not FDA approved in patients less than 72 years old. Performed at [facility], [address], [place], [place] [address]   Culture, body fluid w Gram Stain-bottle     Status: None   Collection Time: 03/25/2021  4:18 PM   Specimen: Pleura  Result Value Ref Range Status   Specimen Description PLEURAL  Final   Special Requests   Final    BOTTLES DRAWN AEROBIC AND ANAEROBIC Blood Culture adequate volume   Culture   Final    NO GROWTH 5 DAYS Performed at [facility], [address], [place], [place] [address]    Report Status 04/08/2021 FINAL  Final  Gram stain     Status: None   Collection Time: 04/14/2021  4:18 PM   Specimen: Pleura  Result Value Ref Range Status  Specimen Description PLEURAL  Final   Special Requests NONE  Final   Gram Stain   Final    WBC PRESENT,BOTH PMN AND MONONUCLEAR NO ORGANISMS SEEN CYTOSPIN SMEAR Performed at [facility], [address], [place], [place] [address]    Report Status 04/04/2021 FINAL  Final  Culture, blood (Routine X 2) w Reflex to ID Panel     Status: None   Collection Time: 04/02/2021  7:50 PM   Specimen: BLOOD RIGHT ARM  Result Value Ref Range Status   Specimen Description BLOOD RIGHT ARM  Final   Special Requests   Final    BOTTLES DRAWN AEROBIC AND ANAEROBIC Blood Culture results may not be optimal due to an excessive volume of blood received in culture bottles   Culture   Final    NO GROWTH 5 DAYS Performed at [facility], [address] [place], [place] [address]    Report Status 04/08/2021 FINAL  Final  Culture, blood (Routine X 2) w Reflex to ID Panel     Status: None   Collection Time: 03/30/2021  7:50 PM   Specimen: BLOOD LEFT HAND  Result Value Ref Range Status   Specimen Description BLOOD LEFT HAND  Final    Special Requests   Final    BOTTLES DRAWN AEROBIC AND ANAEROBIC Blood Culture adequate volume   Culture   Final    NO GROWTH 5 DAYS Performed at [facility], [address] [place], [place] [address]    Report Status 04/08/2021 FINAL  Final  Body fluid culture w Gram Stain     Status: None   Collection Time: 04/04/21  1:02 PM   Specimen: Body Fluid  Result Value Ref Range Status   Specimen Description FLUID  Final   Special Requests  PLEURAL  Final   Gram Stain   Final    ABUNDANT WBC PRESENT, PREDOMINANTLY MONONUCLEAR NO ORGANISMS SEEN    Culture   Final    NO GROWTH 3 DAYS Performed at [facility], [address] [place], [place] [address]    Report Status 04/07/2021 FINAL  Final  Fungus Culture With Stain     Status: None (Preliminary result)   Collection Time: 04/04/21  1:03 PM   Specimen: Pleural Fluid  Result Value Ref Range Status   Fungus Stain Final report  Final    Comment: (NOTE) Performed At: [facility] [place], [place] [id number] Rush Farmer MD YF:[id number]    Fungus (Mycology) Culture PENDING  Incomplete   Fungal Source FLUID  Final    Comment: Performed at [facility], [place] [address] [place], [place] [address]  Fungus Culture Result     Status: None   Collection Time: 04/04/21  1:03 PM  Result Value Ref Range Status   Result 1 Comment  Final    Comment: (NOTE) KOH/Calcofluor preparation:  no fungus observed. Performed At: [facility] [place], [place] [id number] Rush Farmer MD OO:[id number]   MRSA Next Gen by PCR, Nasal     Status: Abnormal   Collection Time: 04/04/21  3:52 PM   Specimen: Nasal Mucosa; Nasal Swab  Result Value Ref Range Status   MRSA by PCR Next Gen DETECTED (A) NOT DETECTED Final    Comment: RESULT CALLED TO, READ BACK BY AND VERIFIED WITH: Brandt Loosen RN [id number] 04/04/21 A BROWNING (NOTE) The GeneXpert MRSA Assay (FDA approved for NASAL specimens only), is one  component of a comprehensive MRSA  colonization surveillance program. It is not intended to diagnose MRSA infection nor to guide or monitor treatment for MRSA infections. Test performance is not FDA approved in patients less than 25 years old. Performed at [facility], [place] [address], [place], [place] [address]       Radiology Studies: DG CHEST PORT 1 VIEW  Result Date: 04/07/2021 CLINICAL DATA:  Pneumonia.  Pleural effusion. EXAM: PORTABLE CHEST 1 VIEW COMPARISON:  04/05/2021 FINDINGS: 0616 hours. Low lung volumes. The cardio pericardial silhouette is enlarged. There is pulmonary vascular congestion without overt pulmonary edema. Right base collapse/consolidation with pleural effusion again noted. The tiny right-sided pneumothorax is similar. Telemetry leads overlie the chest. IMPRESSION: 1. Right base collapse/consolidation with right pleural effusion similar to minimally progressive in the interval. 2. Tiny right apical pneumothorax is similar to prior. Electronically Signed   By: Misty Stanley M.D.   On: 04/07/2021 08:47    Scheduled Meds:  amiodarone  200 mg Oral Daily   atorvastatin  40 mg Oral QPM   busPIRone  7.5 mg Oral BID   chlorhexidine  15 mL Mouth Rinse BID   fluticasone furoate-vilanterol  1 puff Inhalation Daily   insulin aspart  0-15 Units Subcutaneous TID WC   ipratropium  2 spray Each Nare BID   magnesium oxide  400 mg Oral Daily   mouth rinse  15 mL Mouth Rinse q12n4p   midodrine  10 mg Oral TID WC   mupirocin ointment  1 application Nasal BID   pantoprazole  40 mg Oral BID   polyethylene glycol  17 g Oral Daily   torsemide  20 mg Oral Daily   umeclidinium bromide  1 puff Inhalation Daily   Continuous Infusions:  sodium chloride Stopped (04/05/21 2310)   piperacillin-tazobactam (ZOSYN)  IV 3.375 g (04/08/21 0648)   vancomycin 750 mg (04/08/21 0840)     LOS: 5 days   Time spent: 35 minutes.  Patrecia Pour, MD [facility] [email or web address] 04/08/2021, 2:05 PM

## 2021-04-08 NOTE — TOC Initial Note (Signed)
Transition of Care [facility]) - Initial/Assessment Note    Patient Details  Name: Gary [name] MRN: [id number] Date of Birth: [birth year]  Transition of Care [facility]) CM/SW Contact:    Bethena Roys, RN Phone Number: 04/08/2021, 3:26 PM  Clinical Narrative:  Risk for readmission assessment completed. Case Manager spoke with patient regarding disposition needs. Prior to arrival patient was from home with spouse. Patient lives in a single family home and he has durable medical equipment cane, wheelchair, rolling walker, rollator, and oxygen 8 Liters. Patient has oxygen via [employer]. Case Manager will continue to follow for disposition needs.                  Expected Discharge Plan: [place] Barriers to Discharge: Continued Medical Work up   Patient Goals and CMS Choice Patient states their goals for this hospitalization and ongoing recovery are:: to return home      Expected Discharge Plan and Services Expected Discharge Plan: [place]   Discharge Planning Services: CM Consult Post Acute Care Choice: Home Health, Durable Medical Equipment Living arrangements for the past 2 months: Single Family Home                   DME Agency: NA     Prior Living Arrangements/Services Living arrangements for the past 2 months: Single Family Home Lives with:: Self, Spouse Patient language and need for interpreter reviewed:: Yes Do you feel safe going back to the place where you live?: Yes      Need for Family Participation in Patient Care: Yes (Comment) Care giver support system in place?: Yes (comment) Current home services: DME (Patient has home oxygen with [employer].) Criminal Activity/Legal Involvement Pertinent to Current Situation/Hospitalization: No - Comment as needed  Activities of Daily Living Home Assistive Devices/Equipment: CPAP, CBG Meter, Oxygen ADL Screening (condition at time of admission) Patient's cognitive ability adequate to  safely complete daily activities?: Yes Is the patient deaf or have difficulty hearing?: No Does the patient have difficulty seeing, even when wearing glasses/contacts?: No Does the patient have difficulty concentrating, remembering, or making decisions?: No Patient able to express need for assistance with ADLs?: Yes Does the patient have difficulty dressing or bathing?: No Independently performs ADLs?: Yes (appropriate for developmental age) Does the patient have difficulty walking or climbing stairs?: No Weakness of Legs: None Weakness of Arms/Hands: None  Permission Sought/Granted Permission sought to share information with : Family Supports, Case Freight forwarder, Chartered certified accountant granted to share information with : Yes, Verbal Permission Granted   Emotional Assessment Appearance:: Appears stated age Attitude/Demeanor/Rapport: Engaged Affect (typically observed): Appropriate Orientation: : Oriented to Situation, Oriented to  Time, Oriented to Place, Oriented to Self Alcohol / Substance Use: Not Applicable Psych Involvement: No (comment)  Admission diagnosis:  Pleural effusion [J90] Acute and chronic respiratory failure with hypoxia (HCC) [J96.21] Acute on chronic respiratory failure with hypoxia and hypercapnia (HCC) [T01.77, J96.22] Anemia, unspecified type [D64.9] Patient Active Problem List   Diagnosis Date Noted   Acute on chronic respiratory failure with hypoxia and hypercapnia ([place]) 03/30/2021   Acute on chronic diastolic CHF (congestive heart failure) ([place]) 04/11/2021   Anemia    Pulmonary hypertension ([place])    Small bowel obstruction ([place]) 01/10/2021   SBO (small bowel obstruction) ([place]) 01/10/2021   Ileitis, terminal ([place]) 01/10/2021   Microcytic anemia 01/10/2021   S/P thoracentesis    Acute and chronic respiratory failure with hypoxia ([place]) 12/02/2020  Lung mass 10/15/2020   Acute blood loss anemia 10/15/2020   Chronic respiratory failure ([place])  10/09/2020   COPD, 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   Hypoalbuminemia due to protein-calorie malnutrition (HCC)    Hyponatremia    Leukocytosis    Hypomagnesemia    Supplemental oxygen dependent    Debility 10/02/2020   ARDS (adult respiratory distress syndrome) (HCC)    Recurrent pleural effusion on right    Atypical atrial flutter (HCC)    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 [place] [address] [place] [address] Phone: [phone] Fax: [place] [address] [place] [address] Phone: [phone] Fax: [place], [place] [address] [place] [address] Phone: [phone] Fax: [phone]   Readmission Risk Interventions Readmission Risk Prevention Plan 04/08/2021 12/10/2020  Transportation Screening Complete Complete  Medication Review (RN Care Manager) Complete Referral to Pharmacy  PCP or Specialist appointment within 3-5 days of discharge Complete Complete  HRI or [place] Complete Complete  SW Recovery Care/Counseling Consult Complete Complete  Palliative Care Screening Complete Not [place] Not Applicable Not Applicable  Some recent data might be hidden

## 2021-04-09 DIAGNOSIS — J9621 Acute and chronic respiratory failure with hypoxia: Secondary | ICD-10-CM | POA: Diagnosis not present

## 2021-04-09 DIAGNOSIS — I5033 Acute on chronic diastolic (congestive) heart failure: Secondary | ICD-10-CM | POA: Diagnosis not present

## 2021-04-09 DIAGNOSIS — Z7189 Other specified counseling: Secondary | ICD-10-CM | POA: Diagnosis not present

## 2021-04-09 DIAGNOSIS — I482 Chronic atrial fibrillation, unspecified: Secondary | ICD-10-CM | POA: Diagnosis not present

## 2021-04-09 DIAGNOSIS — Z515 Encounter for palliative care: Secondary | ICD-10-CM | POA: Diagnosis not present

## 2021-04-09 DIAGNOSIS — J90 Pleural effusion, not elsewhere classified: Secondary | ICD-10-CM | POA: Diagnosis not present

## 2021-04-09 LAB — BASIC METABOLIC PANEL
Anion gap: 7 (ref 5–15)
BUN: 20 mg/dL (ref 8–23)
CO2: 33 mmol/L — ABNORMAL HIGH (ref 22–32)
Calcium: 7.9 mg/dL — ABNORMAL LOW (ref 8.9–10.3)
Chloride: 96 mmol/L — ABNORMAL LOW (ref 98–111)
Creatinine, Ser: 0.9 mg/dL (ref 0.61–1.24)
GFR, Estimated: >60 mL/min (ref >60)
Glucose, Bld: 138 mg/dL — ABNORMAL HIGH (ref 70–99)
Potassium: 3.3 mmol/L — ABNORMAL LOW (ref 3.5–5.1)
Sodium: 136 mmol/L (ref 135–145)

## 2021-04-09 LAB — GLUCOSE, CAPILLARY
Glucose-Capillary: 129 mg/dL — ABNORMAL HIGH (ref 70–99)
Glucose-Capillary: 184 mg/dL — ABNORMAL HIGH (ref 70–99)
Glucose-Capillary: 170 mg/dL — ABNORMAL HIGH (ref 70–99)
Glucose-Capillary: 216 mg/dL — ABNORMAL HIGH (ref 70–99)

## 2021-04-09 LAB — CBC
HCT: 27.1 % — ABNORMAL LOW (ref 39.0–52.0)
Hemoglobin: 8 g/dL — ABNORMAL LOW (ref 13.0–17.0)
MCH: 24.2 pg — ABNORMAL LOW (ref 26.0–34.0)
MCHC: 29.5 g/dL — ABNORMAL LOW (ref 30.0–36.0)
MCV: 82.1 fL (ref 80.0–100.0)
Platelets: 469 10*3/uL — ABNORMAL HIGH (ref 150–400)
RBC: 3.3 MIL/uL — ABNORMAL LOW (ref 4.22–5.81)
RDW: 19.6 % — ABNORMAL HIGH (ref 11.5–15.5)
WBC: 12.1 10*3/uL — ABNORMAL HIGH (ref 4.0–10.5)
nRBC: 0 % (ref 0.0–0.2)

## 2021-04-09 LAB — CYTOLOGY - NON PAP

## 2021-04-09 NOTE — Evaluation (Signed)
Physical Therapy Evaluation Patient Details Name: Gary [name] MRN: [id number] DOB: Jul 13, 1950 Today's Date: 04/09/2021  History of Present Illness  Patient is a 70 y/o male who was admitted 11/16 with SOB. Found to have acute on chronic hypoxemic respiratory failure and Right middle lobe mass with lymphadenopathy and recurrent right pleural effusion. Questionable placement of Pleurx catheter vs comfort care.  PMH includes DM, CHF, lung mass, COPD on home 02, A-fib, pulmonary HTN, CAD.  Clinical Impression  Patient presents with dyspnea on exertion, decreased endurance, decreased activity tolerance and impaired mobility s/p above. 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 Today, mobility assessment limited due to HFNC machine. Requires Min A to take a few steps to get to chair with only mild DOE. BP soft sitting EOB 94/55 with some dizziness initially. Sp02 >88% on 35L [place] during activity. Plan for possible pleurx. Will progress ambulation when pt safely able to do so to prepare for d/c home. Will follow acutely.     Recommendations for follow up therapy are one component of a multi-disciplinary discharge planning process, led by the attending physician.  Recommendations may be updated based on patient status, additional functional criteria and insurance authorization.  Follow Up Recommendations Home health PT (pending progress)    Assistance Recommended at Discharge Frequent or constant Supervision/Assistance  Functional Status Assessment Patient has had a recent decline in their functional status and demonstrates the ability to make significant improvements in function in a reasonable and predictable amount of time.  Equipment Recommendations  None recommended by PT    Recommendations for Other Services       Precautions / Restrictions Precautions Precautions: Other (comment);Fall Precaution  Comments: watch 02 and BP soft, HFNC Restrictions Weight Bearing Restrictions: No      Mobility  Bed Mobility Overal bed mobility: Needs Assistance Bed Mobility: Supine to Sit     Supine to sit: Supervision;HOB elevated     General bed mobility comments: No assist needed, supervision for safety/lines.    Transfers Overall transfer level: Needs assistance Equipment used: 1 person hand held assist Transfers: Sit to/from Stand;Bed to chair/wheelchair/BSC Sit to Stand: Min guard   Step pivot transfers: Min assist       General transfer comment: Min guard assist for safety and to manage lines. Able to take steps to get to chair, assist with managing lines/02 tubing. 2/4 DOE. Sp02 >88% on 35L HFnC.    Ambulation/Gait                  Stairs            Wheelchair Mobility    Modified Rankin (Stroke Patients Only)       Balance Overall balance assessment: Needs assistance Sitting-balance support: Feet supported;No upper extremity supported Sitting balance-Leahy Scale: Good     Standing balance support: During functional activity Standing balance-Leahy Scale: Fair Standing balance comment: Min guard-Min A for standing balance, UE support needed for dynamic tasks                             Pertinent Vitals/Pain Pain Assessment: No/denies pain    Home Living Family/patient expects to be discharged to:: Private residence Living Arrangements: Spouse/significant other Available Help at Discharge: Family;Available PRN/intermittently Type of Home: House Home Access: Ramped entrance       Home Layout: One level Home Equipment: Conservation officer, nature (  2 wheels);Cane - single point;Rollator (4 wheels);Other (comment);Shower seat;Grab bars - tub/shower Additional Comments: adjustable mattress, home 02    Prior Function Prior Level of Function : Independent/Modified Independent             Mobility Comments: Independent, 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,  cooking/cleaning ADLs Comments: independent     Hand Dominance   Dominant Hand: Right    Extremity/Trunk Assessment   Upper Extremity Assessment Upper Extremity Assessment: Defer to OT evaluation    Lower Extremity Assessment Lower Extremity Assessment: Generalized weakness    Cervical / Trunk Assessment Cervical / Trunk Assessment: Kyphotic  Communication   Communication: No difficulties  Cognition Arousal/Alertness: Awake/alert Behavior During Therapy: WFL for tasks assessed/performed Overall Cognitive Status: Within Functional Limits for tasks assessed                                          General Comments General comments (skin integrity, edema, etc.): BP soft sitting EOB 94/55 with some dizziness initially. Sp02 >88% on 35L H[facility] [place].    Exercises     Assessment/Plan    PT Assessment Patient needs continued PT services  PT Problem List Decreased strength;Decreased mobility;Cardiopulmonary status limiting activity;Decreased activity tolerance;Decreased balance       PT Treatment Interventions Therapeutic activities;Functional mobility training;Therapeutic exercise;Patient/family education;Gait training    PT Goals (Current goals can be found in the Care Plan section)  Acute Rehab PT Goals Patient Stated Goal: to get out of bed PT Goal Formulation: With patient Time For Goal Achievement: 04/23/21 Potential to Achieve Goals: Good    Frequency Min 3X/week   Barriers to discharge        Co-evaluation               AM-PAC PT "6 Clicks" Mobility  Outcome Measure Help needed turning from your back to your side while in a flat bed without using bedrails?: A Little Help needed moving from lying on your back to sitting on the side of a flat bed without using bedrails?: A Little Help needed moving to and from a bed to a chair (including a wheelchair)?: A Little Help needed standing up from a chair using your arms (e.g., wheelchair or  bedside chair)?: A Little Help needed to walk in hospital room?: A Little Help needed climbing 3-5 steps with a railing? : A Lot 6 Click Score: 17    End of Session Equipment Utilized During Treatment: Oxygen (35L HF [place]) Activity Tolerance: Patient tolerated treatment well Patient left: in chair;with call bell/phone within reach Nurse Communication: Mobility status PT Visit Diagnosis: Muscle weakness (generalized) (M62.81);Unsteadiness on feet (R26.81);Difficulty in walking, not elsewhere classified (R26.2)    Time: 9407-6808 PT Time Calculation (min) (ACUTE ONLY): 19 min   Charges:   PT Evaluation $PT Eval Moderate Complexity: 1 Mod          Gary [name], PT, DPT Acute Rehabilitation Services Pager [phone] Office [phone]     Gary [name] 04/09/2021, 2:38 PM

## 2021-04-09 NOTE — Progress Notes (Signed)
PROGRESS NOTE  Olaoluwa Grieder  GXQ:[id number] DOB: 1950-11-20 DOA: 04/04/2021 PCP: Jalene Mullet, PA-C   Brief Narrative: Parke Jandreau is a 70 y.o. retired [employer] administrator with a history of chronic hypoxic respiratory failure, chronic HFpEF, pulmonary HTN, T2DM, progressive lung mass with bronchoscopy declined by patient previously who presented to [facility] 11/17 with respiratory distress, hypoxia, hypotension requiring NIPPV and pressor support. Initial thoracentesis of loculated pleural effusion was unsuccessful. He was transferred to ICU at [facility] where 2L blood tinged fluid was drained with right thoracentesis on 11/17. Respiratory distress has continued despite diuretic and antibiotics after thoracentesis. Plan is tentatively to consider palliative pleur-x placement.    Assessment & Plan: Active Problems:   Atrial fibrillation, chronic (HCC)   Right bundle branch block   Pleural effusion   Hyponatremia   COPD, severe (HCC)   Acute and chronic respiratory failure with hypoxia (HCC)   Acute on chronic respiratory failure with hypoxia and hypercapnia (HCC)   Acute on chronic diastolic CHF (congestive heart failure) (HCC)  Acute on chronic hypoxic respiratory failure: Multifactorial with recurrent exudative pleural effusion, possible aspiration pneumonia, possible advanced pulmonary malignancy (persistent mediastinal adenopathy, recurrent exudative effusion though cytology has been negative x3 [5/3, 7/19, 7/23]) on chronic pulmonary HTN, COPD, CHF on baseline deconditioning.   - Pt currently requiring 35L at 70% HHFNC, BiPAP overnight. PCCM and palliative continue to follow. Continue weaning efforts in hopes to decreasing enough to facilitate discharge, possibly with home hospice. - To assist with symptom burden, PCCM considering pleurx placement. Can give morphine, e.g. if required for air hunger.  - Cytology from thoracenteses 11/16 and 11/18 remain without results on recheck. - Incentive  spirometry, pulmonary hygiene. - Continue vancomycin, zosyn x7 days. - Continue inhaled therapies - Should attempt to mobilize to whatever extent possible to minimize deconditioning.  PAF: Currently NSR, 1st deg AVB.  - Continue amiodarone - Continue eliquis.   Hypotension:  - Continue midodrine at high dose. Hold parameters for torsemide, slidenafil placed.  Hx PUD:  - PPI BID  Chronic HFpEF:  - Tx HTN, continue baseline torsemide   Anemia of chronic disease: Roughly stable hgb in 8's. No bleeding reported.  Pulmonary HTN:  - Continue sildenafil.   T2DM: HbA1c 6.9%.  - Continue glargine insulin 10u and mod SSI  HLD:  - Continue statin  Anxiety:  - Continue buspar.    DVT prophylaxis: Eliquis Code Status: DNR Family Communication: None at bedside. Spoke with spouse Disposition Plan:  Status is: Inpatient  Remains inpatient appropriate because: Continued respiratory failure.   Consultants:  PCCM Palliative  Procedures:  Right thoracentesis 11/16, 11/18  Antimicrobials: Vancomycin, zosyn   Subjective: Again states he's breathing a little easier, and feels he'll be able to get up with some assistance. Desperately wants OOB. No chest pain or other new complaints.   Objective: Vitals:   04/09/21 0720 04/09/21 0914 04/09/21 1444 04/09/21 1500  BP:    97/61  Pulse:  85 74 76  Resp:   18 18  Temp:      TempSrc:      SpO2: 100% 97% 98% 94%  Weight:      Height:        Intake/Output Summary (Last 24 hours) at 04/09/2021 1544 Last data filed at 04/09/2021 1423 Gross per 24 hour  Intake 1544.76 ml  Output 950 ml  Net 594.76 ml   Filed Weights   04/02/2021 1529 04/04/21 0414 04/04/21 1137  Weight: 68.4 kg 70.2  kg 68.8 kg   Gen: Ill-appearing, tired, very pleasant male in no distress Pulm: Tachypneic with decreased R > L breath sounds. No wheezes. CV: Regular rate and rhythm. No murmur, rub, or gallop. No JVD, no significant dependent edema. GI: Abdomen  soft, non-tender, non-distended, with normoactive bowel sounds.  Ext: Warm, no deformities Skin: No new rashes, lesions or ulcers on visualized skin. Neuro: Alert and oriented, follows conversation but does get sleepy quickly during conversation. There are no focal neurological deficits. Psych: Judgement and insight appear marginal. Mood euthymic & affect congruent. Behavior is appropriate.    Data Reviewed: I have personally reviewed following labs and imaging studies  CBC: Recent Labs  Lab 03/31/2021 0958 04/16/2021 1952 04/04/21 0434 04/06/21 0150 04/07/21 0207 04/09/21 0218  WBC 14.0*  --  12.9* 12.2* 12.1* 12.1*  NEUTROABS 12.5*  --   --   --   --   --   HGB 7.0* 8.6* 8.0* 8.2* 8.1* 8.0*  HCT 23.3* 27.7* 26.3* 27.8* 27.4* 27.1*  MCV 78.7*  --  80.9 81.5 81.5 82.1  PLT 467*  --  428* 426* 447* 026*   Basic Metabolic Panel: Recent Labs  Lab 04/04/21 0434 04/05/21 0256 04/06/21 0150 04/07/21 0207 04/09/21 0218  NA 136 135 134* 137 136  K 3.8 3.7 4.2 4.0 3.3*  CL 99 94* 95* 97* 96*  CO2 29 29 30  32 33*  GLUCOSE 140* 139* 151* 160* 138*  BUN 28* 22 22 21 20   CREATININE 0.91 0.99 1.05 0.99 0.90  CALCIUM 8.3* 8.4* 8.2* 7.8* 7.9*  MG 2.0 1.9  --   --   --    GFR: Estimated Creatinine Clearance: 69.4 mL/min (by C-G formula based on SCr of 0.9 mg/dL). Liver Function Tests: No results for input(s): AST, ALT, ALKPHOS, BILITOT, PROT, ALBUMIN in the last 168 hours. No results for input(s): LIPASE, AMYLASE in the last 168 hours. No results for input(s): AMMONIA in the last 168 hours. Coagulation Profile: Recent Labs  Lab 04/10/2021 0958  INR 1.2   Cardiac Enzymes: No results for input(s): CKTOTAL, CKMB, CKMBINDEX, TROPONINI in the last 168 hours. BNP (last 3 results) No results for input(s): PROBNP in the last 8760 hours. HbA1C: No results for input(s): HGBA1C in the last 72 hours. CBG: Recent Labs  Lab 04/08/21 1125 04/08/21 1628 04/08/21 2152 04/09/21 0750  04/09/21 1149  GLUCAP 224* 112* 176* 129* 184*   Lipid Profile: No results for input(s): CHOL, HDL, LDLCALC, TRIG, CHOLHDL, LDLDIRECT in the last 72 hours. Thyroid Function Tests: No results for input(s): TSH, T4TOTAL, FREET4, T3FREE, THYROIDAB in the last 72 hours. Anemia Panel: No results for input(s): VITAMINB12, FOLATE, FERRITIN, TIBC, IRON, RETICCTPCT in the last 72 hours.  Urine analysis:    Component Value Date/Time   COLORURINE YELLOW 01/10/2021 1400   APPEARANCEUR CLEAR 01/10/2021 1400   LABSPEC <1.005 (L) 01/10/2021 1400   PHURINE 7.0 01/10/2021 1400   GLUCOSEU >=500 (A) 01/10/2021 1400   HGBUR NEGATIVE 01/10/2021 1400   BILIRUBINUR NEGATIVE 01/10/2021 1400   KETONESUR NEGATIVE 01/10/2021 1400   PROTEINUR NEGATIVE 01/10/2021 1400   NITRITE NEGATIVE 01/10/2021 1400   LEUKOCYTESUR NEGATIVE 01/10/2021 1400   Recent Results (from the past 240 hour(s))  Resp Panel by RT-PCR (Flu A&B, Covid) Nasopharyngeal Swab     Status: None   Collection Time: 03/19/2021 10:11 AM   Specimen: Nasopharyngeal Swab; Nasopharyngeal(NP) swabs in vial transport medium  Result Value Ref Range Status   SARS Coronavirus 2 by  RT PCR NEGATIVE NEGATIVE Final    Comment: (NOTE) SARS-CoV-2 target nucleic acids are NOT DETECTED.  The SARS-CoV-2 RNA is generally detectable in upper respiratory specimens during the acute phase of infection. The lowest concentration of SARS-CoV-2 viral copies this assay can detect is 138 copies/mL. A negative result does not preclude SARS-Cov-2 infection and should not be used as the sole basis for treatment or other patient management decisions. A negative result may occur with  improper specimen collection/handling, submission of specimen other than nasopharyngeal swab, presence of viral mutation(s) within the areas targeted by this assay, and inadequate number of viral copies(<138 copies/mL). A negative result must be combined with clinical observations, patient  history, and epidemiological information. The expected result is Negative.  Fact Sheet for Patients:  EntrepreneurPulse.com.au  Fact Sheet for Healthcare Providers:  IncredibleEmployment.be  This test is no t yet approved or cleared by the [place] FDA and  has been authorized for detection and/or diagnosis of SARS-CoV-2 by FDA under an Emergency Use Authorization (EUA). This EUA will remain  in effect (meaning this test can be used) for the duration of the COVID-19 declaration under Section 564(b)(1) of the Act, 21 U.S.C.section 360bbb-3(b)(1), unless the authorization is terminated  or revoked sooner.       Influenza A by PCR NEGATIVE NEGATIVE Final   Influenza B by PCR NEGATIVE NEGATIVE Final    Comment: (NOTE) The Xpert Xpress SARS-CoV-2/FLU/RSV plus assay is intended as an aid in the diagnosis of influenza from Nasopharyngeal swab specimens and should not be used as a sole basis for treatment. Nasal washings and aspirates are unacceptable for Xpert Xpress SARS-CoV-2/FLU/RSV testing.  Fact Sheet for Patients: EntrepreneurPulse.com.au  Fact Sheet for Healthcare Providers: IncredibleEmployment.be  This test is not yet approved or cleared by the [place] FDA and has been authorized for detection and/or diagnosis of SARS-CoV-2 by FDA under an Emergency Use Authorization (EUA). This EUA will remain in effect (meaning this test can be used) for the duration of the COVID-19 declaration under Section 564(b)(1) of the Act, 21 U.S.C. section 360bbb-3(b)(1), unless the authorization is terminated or revoked.  Performed at [facility], [address] [place], [place] [address]   MRSA Next Gen by PCR, Nasal     Status: Abnormal   Collection Time: 03/31/2021  4:08 PM   Specimen: Nasal Mucosa; Nasal Swab  Result Value Ref Range Status   MRSA by PCR Next Gen DETECTED (A) NOT DETECTED Final    Comment:  RESULT CALLED TO, READ BACK BY AND VERIFIED WITH: ASHLEY SHELTON @ 8115 ON 04/17/2021 C VARNER (NOTE) The GeneXpert MRSA Assay (FDA approved for NASAL specimens only), is one component of a comprehensive MRSA colonization surveillance program. It is not intended to diagnose MRSA infection nor to guide or monitor treatment for MRSA infections. Test performance is not FDA approved in patients less than 66 years old. Performed at [facility], [address], [place], [place] [address]   Culture, body fluid w Gram Stain-bottle     Status: None   Collection Time: 03/20/2021  4:18 PM   Specimen: Pleura  Result Value Ref Range Status   Specimen Description PLEURAL  Final   Special Requests   Final    BOTTLES DRAWN AEROBIC AND ANAEROBIC Blood Culture adequate volume   Culture   Final    NO GROWTH 5 DAYS Performed at [facility], [address], [place], [place] [address]    Report Status 04/08/2021 FINAL  Final  Gram  stain     Status: None   Collection Time: 03/24/2021  4:18 PM   Specimen: Pleura  Result Value Ref Range Status   Specimen Description PLEURAL  Final   Special Requests NONE  Final   Gram Stain   Final    WBC PRESENT,BOTH PMN AND MONONUCLEAR NO ORGANISMS SEEN CYTOSPIN SMEAR Performed at [facility], [address], [place], [place] [address]    Report Status 03/25/2021 FINAL  Final  Culture, blood (Routine X 2) w Reflex to ID Panel     Status: None   Collection Time: 03/31/2021  7:50 PM   Specimen: BLOOD RIGHT ARM  Result Value Ref Range Status   Specimen Description BLOOD RIGHT ARM  Final   Special Requests   Final    BOTTLES DRAWN AEROBIC AND ANAEROBIC Blood Culture results may not be optimal due to an excessive volume of blood received in culture bottles   Culture   Final    NO GROWTH 5 DAYS Performed at [facility], [address] [place], [place] [address]    Report Status 04/08/2021 FINAL  Final  Culture, blood (Routine X 2) w Reflex to ID Panel     Status: None    Collection Time: 04/02/2021  7:50 PM   Specimen: BLOOD LEFT HAND  Result Value Ref Range Status   Specimen Description BLOOD LEFT HAND  Final   Special Requests   Final    BOTTLES DRAWN AEROBIC AND ANAEROBIC Blood Culture adequate volume   Culture   Final    NO GROWTH 5 DAYS Performed at [facility], [address] [place], [place] [address]    Report Status 04/08/2021 FINAL  Final  Body fluid culture w Gram Stain     Status: None   Collection Time: 04/04/21  1:02 PM   Specimen: Body Fluid  Result Value Ref Range Status   Specimen Description FLUID  Final   Special Requests  PLEURAL  Final   Gram Stain   Final    ABUNDANT WBC PRESENT, PREDOMINANTLY MONONUCLEAR NO ORGANISMS SEEN    Culture   Final    NO GROWTH 3 DAYS Performed at [facility], [address], [place], [place] [address]    Report Status 04/07/2021 FINAL  Final  Fungus Culture With Stain     Status: None (Preliminary result)   Collection Time: 04/04/21  1:03 PM   Specimen: Pleural Fluid  Result Value Ref Range Status   Fungus Stain Final report  Final    Comment: (NOTE) Performed At: [facility] [place], [place] [id number] Rush Farmer MD [id number]:[id number]    Fungus (Mycology) Culture PENDING  Incomplete   Fungal Source FLUID  Final    Comment: Performed at [facility], [place] [address] [place],  [address]  Fungus Culture Result     Status: None   Collection Time: 04/04/21  1:03 PM  Result Value Ref Range Status   Result 1 Comment  Final    Comment: (NOTE) KOH/Calcofluor preparation:  no fungus observed. Performed At: [facility] [place], [place] [id number] Rush Farmer MD KX:[id number]   MRSA Next Gen by PCR, Nasal     Status: Abnormal   Collection Time: 04/04/21  3:52 PM   Specimen: Nasal Mucosa; Nasal Swab  Result Value Ref Range Status   MRSA by PCR Next Gen DETECTED (A) NOT DETECTED Final    Comment: RESULT CALLED TO, READ BACK  BY AND  VERIFIED WITH: Brandt Loosen RN 2182 04/04/21 A BROWNING (NOTE) The GeneXpert MRSA Assay (FDA approved for NASAL specimens only), is one component of a comprehensive MRSA colonization surveillance program. It is not intended to diagnose MRSA infection nor to guide or monitor treatment for MRSA infections. Test performance is not FDA approved in patients less than 10 years old. Performed at [facility], [place] [address], [place], [place] [address]       Radiology Studies: No results found.  Scheduled Meds:  amiodarone  200 mg Oral Daily   atorvastatin  40 mg Oral QPM   busPIRone  7.5 mg Oral BID   chlorhexidine  15 mL Mouth Rinse BID   fluticasone furoate-vilanterol  1 puff Inhalation Daily   insulin aspart  0-15 Units Subcutaneous TID WC   insulin glargine-yfgn  10 Units Subcutaneous Daily   ipratropium  2 spray Each Nare BID   magnesium oxide  400 mg Oral Daily   mouth rinse  15 mL Mouth Rinse q12n4p   midodrine  10 mg Oral TID WC   pantoprazole  40 mg Oral BID   polyethylene glycol  17 g Oral Daily   sildenafil  20 mg Oral TID   torsemide  20 mg Oral BID   umeclidinium bromide  1 puff Inhalation Daily   Continuous Infusions:  sodium chloride Stopped (04/06/21 1445)     LOS: 6 days   Time spent: 35 minutes.  Patrecia Pour, MD [facility] [email or web address] 04/09/2021, 3:44 PM

## 2021-04-09 NOTE — Care Management (Signed)
[place] 04-09-21 Case Manager received a secure chat from Pulmonary regarding cost for PleurX bottles. Patient has Medicare Part C and [employer] secondary- per [employer] his Trilogy was no co pay; therefore, the bottles should be no co-pay as well. The only way we will be able to know for sure is once the Rx is submitted to [facility] and they will have to submit to insurance for verification. Case Manager will continue to follow for additional home needs.

## 2021-04-09 NOTE — Progress Notes (Signed)
NAME:  Gary [name], MRN:  [id number], DOB:  1950/07/21, LOS: 6 ADMISSION DATE:  03/22/2021, CONSULTATION DATE:  03/28/2021 REFERRING MD:  Dr. Karle Starch, ER, CHIEF COMPLAINT:  Short of breath   History of Present Illness:  70 yo male former smoker presented to [facility] ER with dyspnea and cough.  He has COPD and uses 6 liters oxygen.  He was to have thoracentesis for right pleural effusion by Dr. Valeta Harms on 04/04/21.  He had eliquis held in anticipation of this.  First noted to have pleural effusions in April 2022.  He was started on sildenafil for pulmonary hypertension during hospitalization in May 2022.  He had right thoracentesis done in May and July of 2022.  In ER he was started on supplemental oxygen and Bipap.  He was found to have anemia with hemoglobin 7.  He had EGD in April 2022 that showed large duodenal ulcer, short segments of Barrett's esophagus, and erosive gastritis.  He reports his symptoms started on 03/30/21.  He isn't aware of having blood in his stool or melena.  He denies chest pain or fever.  Pertinent  Medical History  A fib, DM type 2, HLD, HTN, Pulmonary hypertension, Barrett's esophagus, CAD, PUD with upper GI bleeding, COPD, +ANA, Pneumococcal pneumonia April 2022  Studies:  Rt thoracentesis 09/18/20 >> 1200 ml fluid, glucose 220, protein < 3, LDH 139, 790 cells (92% L), cytology negative Rt thoracentesis 12/04/20 >> glucose 174, protein 4, LDH 124, 810 cells (93% L), cytology negative Echo 12/04/20 >> EF 55 to 60%, mod LVH, severe RV systolic dysfx, mod RV enlargement, RVSP 59.5 mmHg, mod LA and RA dilation CT chest 12/31/20 >> atherosclerosis, 2.4 cm subcarinal LN, 2.3 cm Rt hilar LN, 2 cm precarinal LN, multiloculated Rt pleural effusion  Interim History / Subjective:   Appears very frail.  Currently on Eliquis if Pleurx is placed will need to be off Eliquis.  Objective   Blood pressure 104/64, pulse 85, temperature 98.5 F (36.9 C), temperature source Axillary,  resp. rate 20, height 5' 7"  (1.702 m), weight 68.8 kg, SpO2 97 %.    FiO2 (%):  [60 %-90 %] 70 %   Intake/Output Summary (Last 24 hours) at 04/09/2021 0941 Last data filed at 04/09/2021 0303 Gross per 24 hour  Intake 1482.41 ml  Output --  Net 1482.41 ml   Filed Weights   03/20/2021 1529 04/04/21 0414 04/04/21 1137  Weight: 68.4 kg 70.2 kg 68.8 kg    Examination: General: Ill elderly male who is in obvious respiratory HEENT: No JVD or lymphadenopathy is appreciated Neuro: Grossly intact CV: Heart sounds are regular PULM: Obvious respiratory distress at rest currently on 60 L high flow nasal cannula  GI: soft, bsx4 active  GU: Extremities: warm/dry, negative edema  Skin: no rashes or lesions    Resolved Hospital Problem list     Assessment & Plan:  Gary [name] is a 70 y.o. 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  On Eliquis  Pulmonary hypertension. Continue with sildenafil and diuresis  Anemia with hx of duodenal ulcer. Recent Labs    04/07/21 0207 04/09/21 0218  HGB 8.1* 8.0*   Transfuse to protocol  Atrial fibrillation.  Eliquis  in place for atrial fibrillation Questionable discontinue for possible Pleurx catheter placement   Steve Denita Lun ACNP Acute Care Nurse Practitioner [place] Please consult Amion 04/09/2021, 9:41 AM    Labs   CBC: Recent Labs  Lab 03/20/2021 0958 04/07/2021 1952 04/04/21 0434 04/06/21 0150 04/07/21 0207 04/09/21 0218  WBC 14.0*  --  12.9* 12.2* 12.1* 12.1*  NEUTROABS 12.5*  --   --   --   --   --   HGB 7.0* 8.6* 8.0* 8.2* 8.1* 8.0*  HCT 23.3* 27.7* 26.3* 27.8*  27.4* 27.1*  MCV 78.7*  --  80.9 81.5 81.5 82.1  PLT 467*  --  428* 426* 447* 469*    Basic Metabolic Panel: Recent Labs  Lab 04/04/21 0434 04/05/21 0256 04/06/21 0150 04/07/21 0207 04/09/21 0218  NA 136 135 134* 137 136  K 3.8 3.7 4.2 4.0 3.3*  CL 99 94* 95* 97* 96*  CO2 29 29 30  32 33*  GLUCOSE 140* 139* 151* 160* 138*  BUN 28* 22 22 21 20   CREATININE 0.91 0.99 1.05 0.99 0.90  CALCIUM 8.3* 8.4* 8.2* 7.8* 7.9*  MG 2.0 1.9  --   --   --    GFR: Estimated Creatinine Clearance: 69.4 mL/min (by C-G formula based on SCr of 0.9 mg/dL). Recent Labs  Lab 03/22/2021 1950 04/04/21 0434 04/06/21 0150 04/07/21 0207 04/09/21 0218  PROCALCITON 0.24  --   --   --   --   WBC  --  12.9* 12.2* 12.1* 12.1*    Liver Function Tests: No results for input(s): AST, ALT, ALKPHOS, BILITOT, PROT, ALBUMIN in the last 168 hours. No results for input(s): LIPASE, AMYLASE in the last 168 hours. No results for input(s): AMMONIA in the last 168 hours.  ABG    Component Value Date/Time   PHART 7.484 (H) 04/13/2021 1003   PCO2ART 44.0 04/02/2021 1003   PO2ART 59.0 (L) 04/07/2021 1003   HCO3 32.4 (H) 04/06/2021 1003   TCO2 33 (H) 09/24/2020 1516   ACIDBASEDEF 4.0 (H) 09/07/2020 2236   O2SAT 89.3 03/27/2021 1003     Coagulation Profile: Recent Labs  Lab 03/27/2021 0958  INR 1.2    Cardiac Enzymes: No results for input(s): CKTOTAL, CKMB, CKMBINDEX, TROPONINI in the last 168 hours.  HbA1C: Hgb A1c MFr Bld  Date/Time Value Ref Range Status  04/06/2021 09:58 AM 6.9 (H) 4.8 - 5.6 % Final    Comment:    (NOTE) Pre diabetes:          5.7%-6.4%  Diabetes:              >6.4%  Glycemic control for   <7.0% adults with diabetes   01/11/2021 02:54 AM 7.4 (H) 4.8 - 5.6 % Final    Comment:    (NOTE) Pre diabetes:          5.7%-6.4%  Diabetes:              >6.4%  Glycemic control for   <7.0% adults with diabetes     CBG: Recent Labs  Lab 04/08/21 0831 04/08/21 1125 04/08/21 1628  04/08/21 2152 04/09/21 0750  GLUCAP 289* 224* 112* 176* 129*

## 2021-04-09 NOTE — Progress Notes (Signed)
Daily Progress Note   Patient Name: Gary [name]       Date: 04/09/2021 DOB: 11/29/1950  Age: 70 y.o. MRN#: [id number] Attending Physician: Patrecia Pour, MD Primary Care Physician: Riley Lam Admit Date: 03/23/2021  Reason for Consultation/Follow-up: Establishing goals of care  Patient Profile/HPI:   70 y.o. male  with past medical history of a. Fib, pulmonary hypertension, CHF- cor pulmonale (R ventricular failure), DM2, hypothyroidism, anxiety with associated panic attacks, HLD, HTN, GERD, R lung mass with associated lymphadenopathy (have not been able to biopsy), recurrent pleural effusions s/p several thoracentises, chronic respiratory failure on home oxygen,  admitted on 04/02/2021 with worsening shortness of breath. Palliative medicine consulted for goals of care discussion in light of significant lung disease that continues to worsen.   Subjective: Gary [name] is sitting up in bed. He looks short of breath with increased respirations, but her reports he does not feel SOB.  He says he is getting "the tube" (pleurx cath) today.  Called and spoke to [facility]. She is expecting that pleurx cath will be placed today.  I presented option of Hospice at home after cath is placed and if patient can decrease oxygen requirements. (Would need to be down to 15L max to be at home with 2 concentrators) [name] shares they are not ready to say "yes" to Hospice yet, however, are not saying no. They are processing [name] state and taking it one day at a time. She would like to see how he progresses once the pleurx cath is placed.  [name] requested PT consult to attempt to assist Gary [name] out of bed. I noted his very high oxygen requirement and concern for decompensation with any activity that may be prohibitive-  she shared that he was able to get out of bed during his last hospitalization when he was on similar amounts of oxygen- notes Gary [name] would really like to at least attempt to stand, or transfer to a recliner- for comfort.   Physical Exam Vitals and nursing note reviewed.  Pulmonary:     Comments: Increased effort and rate Neurological:     Mental Status: He is alert and oriented to person, place, and time.            Vital Signs: BP 104/64 (BP Location: Right Arm)   Pulse 85  Temp 98.5 F (36.9 C) (Axillary)   Resp 20   Ht 5' 7"  (1.702 m)   Wt 68.8 kg   SpO2 97%   BMI 23.76 kg/m  SpO2: SpO2: 97 % O2 Device: O2 Device: High Flow Nasal Cannula O2 Flow Rate: O2 Flow Rate (L/min): 35 L/min  Intake/output summary:  Intake/Output Summary (Last 24 hours) at 04/09/2021 1032 Last data filed at 04/09/2021 0303 Gross per 24 hour  Intake 1482.41 ml  Output --  Net 1482.41 ml   LBM: Last BM Date: 04/07/21 Baseline Weight: Weight: 76.9 kg Most recent weight: Weight: 68.8 kg       Palliative Assessment/Data: PPS: 40%      Palliative Care Assessment & Plan    Assessment/Recommendations/Plan  PT consult to assist with increasing activity Palliative pleurx to be placed by pulm Will monitor for ability to decrease oxygen requirements- ultimate goal is try try and get home, possibly with hospice, will have to continue [place] discussions if patient's oxygen requirements are not decreasing- currently he is stuck around 35 L with FIO2 70%, would need to be down to 15L for home with hospice PMT will continue to follow and see as needed- please note- unfortunately, service is currently limited due to high volume and low staffing- please call if urgent assistance is needed   Code Status: DNR  Prognosis:  Unable to determine  Discharge Planning: To Be Determined  Care plan was discussed with patient's spouse, [name] and Dr. Bonner Puna.   Thank you for allowing the Palliative Medicine Team  to assist in the care of this patient.  Total time:  45  mins      Greater than 50%  of this time was spent counseling and coordinating care related to the above assessment and plan.  Mariana Kaufman, AGNP-C Palliative Medicine   Please contact Palliative Medicine Team phone at [phone] for questions and concerns.

## 2021-04-10 ENCOUNTER — Inpatient Hospital Stay (HOSPITAL_COMMUNITY): Payer: Medicare Other

## 2021-04-10 DIAGNOSIS — J9621 Acute and chronic respiratory failure with hypoxia: Secondary | ICD-10-CM | POA: Diagnosis not present

## 2021-04-10 DIAGNOSIS — J910 Malignant pleural effusion: Secondary | ICD-10-CM | POA: Diagnosis not present

## 2021-04-10 LAB — GLUCOSE, CAPILLARY
Glucose-Capillary: 134 mg/dL — ABNORMAL HIGH (ref 70–99)
Glucose-Capillary: 165 mg/dL — ABNORMAL HIGH (ref 70–99)
Glucose-Capillary: 162 mg/dL — ABNORMAL HIGH (ref 70–99)
Glucose-Capillary: 119 mg/dL — ABNORMAL HIGH (ref 70–99)

## 2021-04-10 MED ORDER — POTASSIUM CHLORIDE CRYS ER 20 MEQ PO TBCR
40.0000 meq | EXTENDED_RELEASE_TABLET | Freq: Once | ORAL | Status: AC
  Administered 2021-04-10: 40 meq via ORAL
  Filled 2021-04-10: qty 2

## 2021-04-10 NOTE — Telephone Encounter (Signed)
Pt's appt was cancelled as pt was admitted to the hospital. Pt is still currently in the hospital. Pt can be scheduled for a hospital follow up once discharged. Closing encounter.

## 2021-04-10 NOTE — Progress Notes (Signed)
Helping floor RT.  RN called floor RT d/t pt desat to 70's% (per RN).  RN increased fio2 to 100%.   Upon entering room, sat was 91-96%.  No distress noted, Pt denies SOB, pt denies feeling worse.  [facility] left side clear, right side diminished t/o.  Noted c-xray was 3 days ago, this was discussed w/ RN who is at bedside.  Per RN, she is contacting MD.  I reported this to floor RT caring for pt.

## 2021-04-10 NOTE — Progress Notes (Signed)
NAME:  Gary [name], MRN:  [id number], DOB:  1951/02/24, LOS: 7 ADMISSION DATE:  04/17/2021, CONSULTATION DATE:  03/21/2021 REFERRING MD:  Dr. Karle Starch, ER, CHIEF COMPLAINT:  Short of breath   History of Present Illness:  70 yo male former smoker presented to [facility] ER with dyspnea and cough.  He has COPD and uses 6 liters oxygen.  He was to have thoracentesis for right pleural effusion by Dr. Valeta Harms on 04/04/21.  He had eliquis held in anticipation of this.  First noted to have pleural effusions in April 2022.  He was started on sildenafil for pulmonary hypertension during hospitalization in May 2022.  He had right thoracentesis done in May and July of 2022.  In ER he was started on supplemental oxygen and Bipap.  He was found to have anemia with hemoglobin 7.  He had EGD in April 2022 that showed large duodenal ulcer, short segments of Barrett's esophagus, and erosive gastritis.  He reports his symptoms started on 03/30/21.  He isn't aware of having blood in his stool or melena.  He denies chest pain or fever.  Pertinent  Medical History  A fib, DM type 2, HLD, HTN, Pulmonary hypertension, Barrett's esophagus, CAD, PUD with upper GI bleeding, COPD, +ANA, Pneumococcal pneumonia April 2022  Studies:  Rt thoracentesis 09/18/20 >> 1200 ml fluid, glucose 220, protein < 3, LDH 139, 790 cells (92% L), cytology negative Rt thoracentesis 12/04/20 >> glucose 174, protein 4, LDH 124, 810 cells (93% L), cytology negative Echo 12/04/20 >> EF 55 to 60%, mod LVH, severe RV systolic dysfx, mod RV enlargement, RVSP 59.5 mmHg, mod LA and RA dilation CT chest 12/31/20 >> atherosclerosis, 2.4 cm subcarinal LN, 2.3 cm Rt hilar LN, 2 cm precarinal LN, multiloculated Rt pleural effusion  Interim History / Subjective:   Intermittent desaturations depending on coughing, moving.  Currently on 1.00 facemask, has been on 0.70+35 L/min No other issues reported  Objective   Blood pressure 104/64, pulse 81, temperature  98.5 F (36.9 C), temperature source Oral, resp. rate (!) 22, height 5' 7"  (1.702 m), weight 68.8 kg, SpO2 (!) 76 %.    FiO2 (%):  [60 %-100 %] 100 %   Intake/Output Summary (Last 24 hours) at 04/10/2021 1147 Last data filed at 04/10/2021 0500 Gross per 24 hour  Intake 62.35 ml  Output 1600 ml  Net -1537.65 ml   Filed Weights   03/30/2021 1529 04/04/21 0414 04/04/21 1137  Weight: 68.4 kg 70.2 kg 68.8 kg    Examination: Gen: Ill-appearing man, no distress CV: Regular, tachycardic, 115 Resp: Decrease breath sounds bilaterally, scattered rhonchi Ext: Trace lower extremity edema Abd: Nondistended, positive bowel sounds   Resolved Hospital Problem list     Assessment & Plan:  Gary [name] is a 70 y.o. 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  Pleurx may be beneficial for symptom management, to avoid hospitalization.  His Eliquis is on hold for Pleurx either on 11/24 or 11/25.  Pulmonary hypertension. -Continue to hold sildenafil, currently on midodrine  Anemia with hx of duodenal ulcer. -Follow CBC Atrial fibrillation. -Eliquis  on hold in prep for Pleurx catheter placement   Baltazar Apo, MD, PhD 04/10/2021, 11:47 AM [facility] [phone] or if no answer before 7:00PM call [phone] For any issues after 7:00PM please call eLink [phone]    Labs   CBC: Recent Labs  Lab 04/15/2021 1952 04/04/21 0434 04/06/21 0150 04/07/21 0207 04/09/21 0218  WBC  --  12.9* 12.2* 12.1*  12.1*  HGB 8.6* 8.0* 8.2* 8.1* 8.0*  HCT 27.7* 26.3* 27.8* 27.4* 27.1*  MCV  --  80.9 81.5 81.5 82.1  PLT  --  428* 426* 447* 469*    Basic Metabolic Panel: Recent Labs  Lab 04/04/21 0434 04/05/21 0256 04/06/21 0150 04/07/21 0207 04/09/21 0218  NA 136 135 134* 137 136  K 3.8 3.7 4.2 4.0 3.3*  CL 99 94* 95* 97* 96*  CO2 29 29 30  32 33*  GLUCOSE 140* 139* 151* 160* 138*  BUN 28* 22 22 21 20   CREATININE 0.91 0.99 1.05 0.99 0.90  CALCIUM 8.3* 8.4* 8.2* 7.8* 7.9*  MG 2.0 1.9  --   --   --    GFR: Estimated Creatinine Clearance: 69.4 mL/min (by C-G formula based on SCr of 0.9 mg/dL). Recent Labs  Lab 04/17/2021 1950 04/04/21 0434 04/06/21 0150 04/07/21 0207 04/09/21 0218  PROCALCITON 0.24  --   --   --   --   WBC  --  12.9* 12.2* 12.1* 12.1*    Liver Function Tests: No results for input(s): AST, ALT, ALKPHOS, BILITOT, PROT, ALBUMIN in the last 168 hours. No results for input(s): LIPASE, AMYLASE in the last 168 hours. No results for input(s): AMMONIA in the last 168 hours.  ABG    Component Value Date/Time   PHART 7.484 (H) 04/01/2021 1003   PCO2ART 44.0 03/21/2021 1003   PO2ART 59.0 (L) 03/28/2021 1003   HCO3 32.4 (H) 04/06/2021 1003   TCO2 33 (H) 09/24/2020 1516   ACIDBASEDEF 4.0 (H) 09/07/2020 2236   O2SAT 89.3 04/09/2021 1003     Coagulation Profile: No results for input(s): INR, PROTIME in the last 168 hours.   Cardiac Enzymes: No results for input(s): CKTOTAL, CKMB, CKMBINDEX, TROPONINI in the last 168 hours.  HbA1C: Hgb A1c MFr Bld  Date/Time Value Ref Range Status  04/15/2021 09:58 AM 6.9 (H) 4.8 - 5.6 % Final    Comment:    (NOTE) Pre diabetes:          5.7%-6.4%  Diabetes:              >6.4%  Glycemic control for   <7.0% adults with diabetes   01/11/2021 02:54 AM 7.4 (H) 4.8 - 5.6 % Final    Comment:    (NOTE) Pre diabetes:          5.7%-6.4%  Diabetes:              >6.4%  Glycemic control for   <7.0% adults with diabetes      CBG: Recent Labs  Lab 04/09/21 1149 04/09/21 1630 04/09/21 2110 04/10/21 0754 04/10/21 1134  GLUCAP 184* 170* 216* 134* 165*

## 2021-04-10 NOTE — Progress Notes (Signed)
PROGRESS NOTE  Gary [name]  UJW:[id number] DOB: [birth year] DOA: 03/26/2021 PCP: Jalene Mullet, PA-C   Brief Narrative: Gary [name] is a 70 y.o. retired [employer] administrator with a history of chronic hypoxic respiratory failure, chronic HFpEF, pulmonary HTN, T2DM, progressive lung mass with bronchoscopy declined by patient previously who presented to [facility] 11/17 with respiratory distress, hypoxia, hypotension requiring NIPPV and pressor support. Initial thoracentesis of loculated pleural effusion was unsuccessful. He was transferred to ICU at [facility] where 2L blood tinged fluid was drained with right thoracentesis on 11/17. Respiratory distress has continued despite diuretic and antibiotics after thoracentesis. Plan is tentatively to consider palliative pleur-x placement.    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   - Pt currently requiring 35L at 70% HHFNC, BiPAP overnight. PCCM and palliative continue to follow. Continue weaning efforts in hopes to decreasing enough to facilitate discharge, possibly with home hospice. - To assist with symptom burden, PCCM considering pleurx placement. Can give morphine, e.g. if required for air hunger.  - Cytology from thoracenteses  11/16: Shows malignant cells consistent with non-small cell lung cancer - Incentive spirometry, pulmonary hygiene. - Continue vancomycin, zosyn x7 days. - Continue inhaled therapies - Should attempt to mobilize to whatever extent possible to minimize deconditioning.-Discussed with Dr. Lamonte Sakai.  His follow-up appreciated.  Suspects that we are heading for hospice care, palliative care and agree with that.  Pleurx for symptomatic management, likely to be placed 11/24 or 11/25. -Chest x-ray 11/23 personally reviewed: Increasing moderate right pleural effusion, new small left pleural effusion.  PAF: Currently NSR, 1st deg AVB.  - Continue amiodarone -Eliquis on hold for Pleurx placement.  Hypotension:  - Continue midodrine at high dose. Hold parameters for torsemide, slidenafil placed. - Better.  Hx PUD:  - PPI BID  Chronic HFpEF:  - Tx HTN, continue baseline torsemide  Hypokalemia: -Replace and follow  Anemia of chronic disease:  - Roughly stable hgb in 8's. No bleeding reported.  Pulmonary HTN:  - Continue sildenafil.   T2DM: HbA1c 6.9%.  - Continue glargine insulin 10u and mod SSI  HLD:  - Continue statin  Anxiety:  - Continue buspar.    DVT prophylaxis: Eliquis currently on hold for Pleurx.  Added SCDs. Code Status: DNR Family Communication: None none at bedside. Disposition Plan:  Status is: Inpatient  Remains inpatient appropriate because: Continued respiratory failure.  Awaiting Pleurx catheter placement.  Consultants:  PCCM Palliative  Procedures:  Right thoracentesis 11/16, 11/18  Cytopathology: Specimen Submitted:  A. PLEURAL FLUID, RIGHT, THORACENTESIS:    FINAL MICROSCOPIC DIAGNOSIS:  - Malignant cells consistent with non-small cell carcinoma   Antimicrobials: Vancomycin, zosyn   Subjective: Ongoing dyspnea, orthopnea, remains on HFNC 35 L/min but despite this states that he slept well last night.  No chest pain.  Objective: Vitals:    04/10/21 0910 04/10/21 0950 04/10/21 1412 04/10/21 1548  BP: 104/64   101/60  Pulse: 79 81 83 82  Resp: (!) 22 (!) 22  18  Temp: 98.5 F (36.9 C)   (!) 96.5 F (35.8 C)  TempSrc: Oral   Oral  SpO2: 91% (!) 76% 91%   Weight:      Height:        Intake/Output Summary (Last 24 hours) at 04/10/2021 1641 Last data filed at 04/10/2021 1400 Gross per 24 hour  Intake 720 ml  Output 650 ml  Net 70 ml   Filed Weights   03/19/2021 1529 04/04/21 0414 04/04/21 1137  Weight: 68.4 kg 70.2 kg 68.8 kg   Gen: Ill-appearing, tired, very pleasant male, moderately built, poorly nourished sitting up in bed with mild respiratory distress.  Worse with minimal exertion. Pulm: Near absent breath sounds in the right lower two thirds of lung fields.  Left lung fields with harsh breath sounds.  No wheezing, rhonchi or crackles.  Mild increased work of breathing, with minimal activity. CV: S1 and S2 heard, RRR.  No JVD, murmurs or pedal edema.  Telemetry personally reviewed: Sinus rhythm with BBB morphology GI: Abdomen soft, non-tender, non-distended, with normoactive bowel sounds.  Ext: Warm, no deformities Skin: No new rashes, lesions or ulcers on visualized skin. Neuro: Alert and oriented, follows conversation but does get sleepy quickly during conversation. There are no focal neurological deficits. Psych: Judgement and insight appear marginal. Mood euthymic & affect congruent. Behavior is appropriate.    Data Reviewed: I have personally reviewed following labs and imaging studies  CBC: Recent Labs  Lab 04/02/2021 1952 04/04/21 0434 04/06/21 0150 04/07/21 0207 04/09/21 0218  WBC  --  12.9* 12.2* 12.1* 12.1*  HGB 8.6* 8.0* 8.2* 8.1* 8.0*  HCT 27.7* 26.3* 27.8* 27.4* 27.1*  MCV  --  80.9 81.5 81.5 82.1  PLT  --  428* 426* 447* 119*   Basic Metabolic Panel: Recent Labs  Lab 04/04/21 0434 04/05/21 0256 04/06/21 0150 04/07/21 0207 04/09/21 0218  NA 136 135 134* 137 136  K 3.8 3.7 4.2 4.0 3.3*   CL 99 94* 95* 97* 96*  CO2 29 29 30  32 33*  GLUCOSE 140* 139* 151* 160* 138*  BUN 28* 22 22 21 20   CREATININE 0.91 0.99 1.05 0.99 0.90  CALCIUM 8.3* 8.4* 8.2* 7.8* 7.9*  MG 2.0 1.9  --   --   --    GFR: Estimated Creatinine Clearance: 69.4 mL/min (by C-G formula based on SCr of 0.9 mg/dL).  CBG: Recent Labs  Lab 04/09/21 1630 04/09/21 2110 04/10/21 0754 04/10/21 1134 04/10/21 1547  GLUCAP 170* 216* 134* 165* 162*   Recent Results (from the past 240 hour(s))  Resp Panel by RT-PCR (Flu A&B, Covid) Nasopharyngeal Swab     Status: None   Collection Time: 04/10/2021 10:11 AM   Specimen: Nasopharyngeal Swab; Nasopharyngeal(NP) swabs in vial transport medium  Result Value Ref Range Status   SARS Coronavirus 2 by RT PCR NEGATIVE NEGATIVE Final    Comment: (NOTE) SARS-CoV-2 target nucleic acids are NOT DETECTED.  The SARS-CoV-2 RNA is generally detectable in upper respiratory specimens during the acute phase of infection. The lowest concentration of SARS-CoV-2 viral copies this assay can detect is 138 copies/mL. A negative result does not preclude SARS-Cov-2 infection and should not be used as the sole basis for treatment or other patient management decisions. A negative result may occur with  improper specimen collection/handling, submission of specimen other than nasopharyngeal swab, presence of viral mutation(s) within  the areas targeted by this assay, and inadequate number of viral copies(<138 copies/mL). A negative result must be combined with clinical observations, patient history, and epidemiological information. The expected result is Negative.  Fact Sheet for Patients:  EntrepreneurPulse.com.au  Fact Sheet for Healthcare Providers:  IncredibleEmployment.be  This test is no t yet approved or cleared by the [place] FDA and  has been authorized for detection and/or diagnosis of SARS-CoV-2 by FDA under an Emergency Use  Authorization (EUA). This EUA will remain  in effect (meaning this test can be used) for the duration of the COVID-19 declaration under Section 564(b)(1) of the Act, 21 U.S.C.section 360bbb-3(b)(1), unless the authorization is terminated  or revoked sooner.       Influenza A by PCR NEGATIVE NEGATIVE Final   Influenza B by PCR NEGATIVE NEGATIVE Final    Comment: (NOTE) The Xpert Xpress SARS-CoV-2/FLU/RSV plus assay is intended as an aid in the diagnosis of influenza from Nasopharyngeal swab specimens and should not be used as a sole basis for treatment. Nasal washings and aspirates are unacceptable for Xpert Xpress SARS-CoV-2/FLU/RSV testing.  Fact Sheet for Patients: EntrepreneurPulse.com.au  Fact Sheet for Healthcare Providers: IncredibleEmployment.be  This test is not yet approved or cleared by the [place] FDA and has been authorized for detection and/or diagnosis of SARS-CoV-2 by FDA under an Emergency Use Authorization (EUA). This EUA will remain in effect (meaning this test can be used) for the duration of the COVID-19 declaration under Section 564(b)(1) of the Act, 21 U.S.C. section 360bbb-3(b)(1), unless the authorization is terminated or revoked.  Performed at [facility], [address] [place], [place] [address]   MRSA Next Gen by PCR, Nasal     Status: Abnormal   Collection Time: 03/25/2021  4:08 PM   Specimen: Nasal Mucosa; Nasal Swab  Result Value Ref Range Status   MRSA by PCR Next Gen DETECTED (A) NOT DETECTED Final    Comment: RESULT CALLED TO, READ BACK BY AND VERIFIED WITH: ASHLEY SHELTON @ 4174 ON 04/13/2021 C VARNER (NOTE) The GeneXpert MRSA Assay (FDA approved for NASAL specimens only), is one component of a comprehensive MRSA colonization surveillance program. It is not intended to diagnose MRSA infection nor to guide or monitor treatment for MRSA infections. Test performance is not FDA approved in patients less  than 1 years old. Performed at [facility], [address], [place], [place] [address]   Culture, body fluid w Gram Stain-bottle     Status: None   Collection Time: 04/04/2021  4:18 PM   Specimen: Pleura  Result Value Ref Range Status   Specimen Description PLEURAL  Final   Special Requests   Final    BOTTLES DRAWN AEROBIC AND ANAEROBIC Blood Culture adequate volume   Culture   Final    NO GROWTH 5 DAYS Performed at [facility], [address], [place], [place] [address]    Report Status 04/08/2021 FINAL  Final  Gram stain     Status: None   Collection Time: 04/05/2021  4:18 PM   Specimen: Pleura  Result Value Ref Range Status   Specimen Description PLEURAL  Final   Special Requests NONE  Final   Gram Stain   Final    WBC PRESENT,BOTH PMN AND MONONUCLEAR NO ORGANISMS SEEN CYTOSPIN SMEAR Performed at [facility], [address] [place], [place] [address]    Report Status 03/26/2021 FINAL  Final  Culture, blood (Routine X 2) w Reflex to ID Panel  Status: None   Collection Time: 04/13/2021  7:50 PM   Specimen: BLOOD RIGHT ARM  Result Value Ref Range Status   Specimen Description BLOOD RIGHT ARM  Final   Special Requests   Final    BOTTLES DRAWN AEROBIC AND ANAEROBIC Blood Culture results may not be optimal due to an excessive volume of blood received in culture bottles   Culture   Final    NO GROWTH 5 DAYS Performed at [facility], [address], [place], [place] [address]    Report Status 04/08/2021 FINAL  Final  Culture, blood (Routine X 2) w Reflex to ID Panel     Status: None   Collection Time: 04/10/2021  7:50 PM   Specimen: BLOOD LEFT HAND  Result Value Ref Range Status   Specimen Description BLOOD LEFT HAND  Final   Special Requests   Final    BOTTLES DRAWN AEROBIC AND ANAEROBIC Blood Culture adequate volume   Culture   Final    NO GROWTH 5 DAYS Performed at [facility], [address], [place], [place] [address]    Report Status 04/08/2021 FINAL  Final  Body  fluid culture w Gram Stain     Status: None   Collection Time: 04/04/21  1:02 PM   Specimen: Body Fluid  Result Value Ref Range Status   Specimen Description FLUID  Final   Special Requests  PLEURAL  Final   Gram Stain   Final    ABUNDANT WBC PRESENT, PREDOMINANTLY MONONUCLEAR NO ORGANISMS SEEN    Culture   Final    NO GROWTH 3 DAYS Performed at [facility], [address] [place], [place] [address]    Report Status 04/07/2021 FINAL  Final  Fungus Culture With Stain     Status: None (Preliminary result)   Collection Time: 04/04/21  1:03 PM   Specimen: Pleural Fluid  Result Value Ref Range Status   Fungus Stain Final report  Final    Comment: (NOTE) Performed At: [facility] [place], [place] [id number] Rush Farmer MD [id number]    Fungus (Mycology) Culture PENDING  Incomplete   Fungal Source FLUID  Final    Comment: Performed at [facility], [place] [address] [place], [place] [address]  Fungus Culture Result     Status: None   Collection Time: 04/04/21  1:03 PM  Result Value Ref Range Status   Result 1 Comment  Final    Comment: (NOTE) KOH/Calcofluor preparation:  no fungus observed. Performed At: [facility] [place], [place] [id number] Rush Farmer MD [id number]   MRSA Next Gen by PCR, Nasal     Status: Abnormal   Collection Time: 04/04/21  3:52 PM   Specimen: Nasal Mucosa; Nasal Swab  Result Value Ref Range Status   MRSA by PCR Next Gen DETECTED (A) NOT DETECTED Final    Comment: RESULT CALLED TO, READ BACK BY AND VERIFIED WITH: Brandt Loosen RN [phone] 04/04/21 A BROWNING (NOTE) The GeneXpert MRSA Assay (FDA approved for NASAL specimens only), is one component of a comprehensive MRSA colonization surveillance program. It is not intended to diagnose MRSA infection nor to guide or monitor treatment for MRSA infections. Test performance is not FDA approved in patients less than 40 years old. Performed  at [facility], [place] [address], [place], [place] [address]       Radiology Studies: DG CHEST PORT 1 VIEW  Result Date: 04/10/2021 CLINICAL DATA:  Shortness  of breath EXAM: PORTABLE CHEST 1 VIEW COMPARISON:  Chest x-ray dated April 07, 2021 FINDINGS: Visualized cardiac and mediastinal contours are unchanged. Moderate right pleural effusion is increased in size compared to prior exam. New small left pleural effusions. Increased lower lung predominant opacities. No evidence of pneumothorax. IMPRESSION: 1. Increased moderate right pleural effusion and new small left pleural effusion. 2. Increased lower lung predominant opacities, likely a combination of pulmonary edema and atelectasis Electronically Signed   By: Yetta Glassman M.D.   On: 04/10/2021 09:49    Scheduled Meds:  amiodarone  200 mg Oral Daily   atorvastatin  40 mg Oral QPM   busPIRone  7.5 mg Oral BID   chlorhexidine  15 mL Mouth Rinse BID   fluticasone furoate-vilanterol  1 puff Inhalation Daily   insulin aspart  0-15 Units Subcutaneous TID WC   insulin glargine-yfgn  10 Units Subcutaneous Daily   ipratropium  2 spray Each Nare BID   magnesium oxide  400 mg Oral Daily   mouth rinse  15 mL Mouth Rinse q12n4p   midodrine  10 mg Oral TID WC   pantoprazole  40 mg Oral BID   polyethylene glycol  17 g Oral Daily   sildenafil  20 mg Oral TID   torsemide  20 mg Oral BID   umeclidinium bromide  1 puff Inhalation Daily   Continuous Infusions:  sodium chloride Stopped (04/06/21 1445)     LOS: 7 days   Time spent: 35 minutes.  Vernell Leep, MD,  FACP, [facility], [facility], [facility] (Care Management Physician Certified) [facility] [place]  To contact the attending provider between 7A-7P or the covering provider during after hours 7P-7A, please log into the web site [email or web address] and access using universal [place] password for that web site. If you do not have the password, please call the  hospital operator.

## 2021-04-10 NOTE — Progress Notes (Signed)
PT Cancellation Note  Patient Details Name: Brave Dack MRN: [id number] DOB: [birth year]   Cancelled Treatment:    Reason Eval/Treat Not Completed: Medical issues which prohibited therapy - Per RN, pt has been desatting this am, requesting PT hold today. Will check back as appropriate.  Stacie Glaze, PT DPT Acute Rehabilitation Services Pager [phone]  Office [phone]    Louis Matte 04/10/2021, 9:33 AM

## 2021-04-10 NOTE — Care Management Important Message (Signed)
Important Message  Patient Details  Name: Gary [name] MRN: [id number] Date of Birth: June 08, 1950   Medicare Important Message Given:  Yes     Shelda Altes 04/10/2021, 11:25 AM

## 2021-04-11 DIAGNOSIS — C3491 Malignant neoplasm of unspecified part of right bronchus or lung: Secondary | ICD-10-CM | POA: Diagnosis not present

## 2021-04-11 DIAGNOSIS — Z9889 Other specified postprocedural states: Secondary | ICD-10-CM | POA: Diagnosis not present

## 2021-04-11 DIAGNOSIS — J910 Malignant pleural effusion: Secondary | ICD-10-CM | POA: Diagnosis not present

## 2021-04-11 DIAGNOSIS — J9621 Acute and chronic respiratory failure with hypoxia: Secondary | ICD-10-CM | POA: Diagnosis not present

## 2021-04-11 DIAGNOSIS — J90 Pleural effusion, not elsewhere classified: Secondary | ICD-10-CM | POA: Diagnosis not present

## 2021-04-11 LAB — BASIC METABOLIC PANEL
Anion gap: 9 (ref 5–15)
BUN: 22 mg/dL (ref 8–23)
CO2: 34 mmol/L — ABNORMAL HIGH (ref 22–32)
Calcium: 8.2 mg/dL — ABNORMAL LOW (ref 8.9–10.3)
Chloride: 94 mmol/L — ABNORMAL LOW (ref 98–111)
Creatinine, Ser: 0.85 mg/dL (ref 0.61–1.24)
GFR, Estimated: >60 mL/min (ref >60)
Glucose, Bld: 121 mg/dL — ABNORMAL HIGH (ref 70–99)
Potassium: 3.6 mmol/L (ref 3.5–5.1)
Sodium: 137 mmol/L (ref 135–145)

## 2021-04-11 LAB — GLUCOSE, CAPILLARY
Glucose-Capillary: 130 mg/dL — ABNORMAL HIGH (ref 70–99)
Glucose-Capillary: 192 mg/dL — ABNORMAL HIGH (ref 70–99)
Glucose-Capillary: 252 mg/dL — ABNORMAL HIGH (ref 70–99)
Glucose-Capillary: 163 mg/dL — ABNORMAL HIGH (ref 70–99)

## 2021-04-11 NOTE — Plan of Care (Signed)

## 2021-04-11 NOTE — Progress Notes (Addendum)
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 Initial thoracentesis of loculated pleural effusion was unsuccessful. He was transferred to ICU at [facility] where 2L blood tinged fluid was drained with right thoracentesis on 11/17. Respiratory distress has continued despite diuretic and antibiotics after thoracentesis. Plan is tentatively to consider palliative pleur-x placement.    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   - Pt currently requiring 35L at 70% HHFNC, BiPAP overnight. PCCM and palliative continue to follow. Continue weaning efforts in hopes to decreasing enough to facilitate discharge, possibly with home hospice. - To assist with symptom burden, PCCM considering pleurx placement. Can give morphine, e.g. if required for air hunger.   - Cytology from thoracenteses 11/16: Shows malignant cells consistent with non-small cell lung cancer - Incentive spirometry, pulmonary hygiene. - Continue vancomycin, zosyn x7 days. - Continue inhaled therapies -Per pulmonology, suspects that we are heading for hospice care, palliative care and agree with that.  Pleurx for symptomatic management, likely to be placed 11/24 or 11/25. -Chest x-ray 11/23 personally reviewed: Increasing moderate right pleural effusion, new small left pleural effusion. - 11/24 morning, noted transiently worsened hypoxia after eating breakfast, required NRBM, now appears to be back on HFNC at 30 L/min, FiO2 100%.  I personally discussed with PCCM team to find out if they are planning to do the Pleurx placement today and requested an early follow-up of the patient.  PAF: Currently NSR, 1st deg AVB.  - Continue amiodarone -Eliquis on hold for Pleurx placement.  Hypotension:  - Continue midodrine at high dose. Hold parameters for torsemide, slidenafil placed. - Ongoing soft blood pressures with SBP in the 80s-90s.  Hx PUD:  - PPI BID  Chronic HFpEF:  - Tx HTN, continue baseline torsemide  Hypokalemia: -Replaced.  Anemia of chronic disease:  - Roughly stable hgb in 8's. No bleeding reported.  Pulmonary HTN:  - Continue sildenafil.   T2DM: HbA1c 6.9%.  - Continue glargine insulin 10u and mod SSI.  Reasonable control.  HLD:  - Continue statin  Anxiety:  - Continue buspar.    DVT prophylaxis: Eliquis currently on hold for Pleurx.  Added SCDs. Code Status: DNR Family Communication: Discussed with spouse via phone.  They had already talked to Dr. Valeta Harms, PCCM earlier in detail.  Plan is for possible Pleurx placement tomorrow if patient is able to lie flat. Disposition Plan:  Status is: Inpatient  Remains inpatient appropriate because: Continued respiratory failure.  Awaiting Pleurx catheter placement.  Consultants:  PCCM Palliative  Procedures:   Right thoracentesis 11/16, 11/18  Cytopathology: Specimen Submitted:  A. PLEURAL FLUID, RIGHT, THORACENTESIS:    FINAL MICROSCOPIC DIAGNOSIS:  - Malignant cells consistent with non-small cell carcinoma   Antimicrobials: Vancomycin, zosyn   Subjective: Seen this morning.  Reported that last night he had worsening dyspnea for about 30 minutes which improved after a nasal plug came out,?  Blood clot.  Reported dyspnea was okay, ongoing DOE.  No pain.  Objective: Vitals:   04/10/21 2202 04/11/21 0540 04/11/21 0742 04/11/21 0902  BP:  107/66    Pulse: 85 77 95 82  Resp: 20 18 (!) 28 20  Temp:  98.5 F (36.9 C)    TempSrc:  Oral    SpO2: 90% 100% 98% 97%  Weight:      Height:        Intake/Output Summary (Last 24 hours) at 04/11/2021 1428 Last data filed at 04/10/2021 2040 Gross per 24 hour  Intake 240 ml  Output 1200 ml  Net -960 ml   Filed Weights   04/14/2021 1529 04/04/21 0414 04/04/21 1137  Weight: 68.4 kg 70.2 kg 68.8 kg   Gen: Ill-appearing, tired, very pleasant male, moderately built, poorly nourished sitting up in bed eating breakfast this morning.  Intermittent mild DOE. Pulm: Near absent breath sounds in the right lower two thirds of lung fields.  Left lung fields with harsh breath sounds.  No wheezing, rhonchi or crackles.  Mild increased work of breathing, with minimal activity.  No significant change since yesterday. CV: S1 and S2 heard, RRR.  No JVD, murmurs or pedal edema.  Telemetry personally reviewed: SR with BBB morphology. GI: Abdomen soft, non-tender, non-distended, with normoactive bowel sounds.  Ext: Warm, no deformities Skin: No new rashes, lesions or ulcers on visualized skin. Neuro: Alert and oriented, follows conversation but does get sleepy quickly during conversation. There are no focal neurological deficits. Psych: Judgement and insight appear marginal. Mood euthymic & affect congruent. Behavior is appropriate.    Data Reviewed: I have  personally reviewed following labs and imaging studies  CBC: Recent Labs  Lab 04/06/21 0150 04/07/21 0207 04/09/21 0218  WBC 12.2* 12.1* 12.1*  HGB 8.2* 8.1* 8.0*  HCT 27.8* 27.4* 27.1*  MCV 81.5 81.5 82.1  PLT 426* 447* 468*   Basic Metabolic Panel: Recent Labs  Lab 04/05/21 0256 04/06/21 0150 04/07/21 0207 04/09/21 0218 04/11/21 0154  NA 135 134* 137 136 137  K 3.7 4.2 4.0 3.3* 3.6  CL 94* 95* 97* 96* 94*  CO2 29 30 32 33* 34*  GLUCOSE 139* 151* 160* 138* 121*  BUN 22 22 21 20 22   CREATININE 0.99 1.05 0.99 0.90 0.85  CALCIUM 8.4* 8.2* 7.8* 7.9* 8.2*  MG 1.9  --   --   --   --    GFR: Estimated Creatinine Clearance: 73.4 mL/min (by C-G formula based on SCr of 0.85 mg/dL).  CBG: Recent Labs  Lab 04/10/21 1134 04/10/21 1547 04/10/21 2147 04/11/21 0735 04/11/21 1202  GLUCAP 165* 162* 119* 130* 192*   Recent Results (from the past 240 hour(s))  Resp Panel by RT-PCR (Flu A&B, Covid) Nasopharyngeal Swab     Status: None   Collection Time: 03/19/2021 10:11 AM   Specimen: Nasopharyngeal Swab; Nasopharyngeal(NP) swabs in vial transport medium  Result Value Ref Range Status   SARS Coronavirus 2 by RT PCR NEGATIVE NEGATIVE Final    Comment: (NOTE) SARS-CoV-2 target nucleic acids are NOT DETECTED.  The SARS-CoV-2 RNA is generally  detectable in upper respiratory specimens during the acute phase of infection. The lowest concentration of SARS-CoV-2 viral copies this assay can detect is 138 copies/mL. A negative result does not preclude SARS-Cov-2 infection and should not be used as the sole basis for treatment or other patient management decisions. A negative result may occur with  improper specimen collection/handling, submission of specimen other than nasopharyngeal swab, presence of viral mutation(s) within the areas targeted by this assay, and inadequate number of viral copies(<138 copies/mL). A negative result must be combined with clinical observations, patient  history, and epidemiological information. The expected result is Negative.  Fact Sheet for Patients:  EntrepreneurPulse.com.au  Fact Sheet for Healthcare Providers:  IncredibleEmployment.be  This test is no t yet approved or cleared by the [place] FDA and  has been authorized for detection and/or diagnosis of SARS-CoV-2 by FDA under an Emergency Use Authorization (EUA). This EUA will remain  in effect (meaning this test can be used) for the duration of the COVID-19 declaration under Section 564(b)(1) of the Act, 21 U.S.C.section 360bbb-3(b)(1), unless the authorization is terminated  or revoked sooner.       Influenza A by PCR NEGATIVE NEGATIVE Final   Influenza B by PCR NEGATIVE NEGATIVE Final    Comment: (NOTE) The Xpert Xpress SARS-CoV-2/FLU/RSV plus assay is intended as an aid in the diagnosis of influenza from Nasopharyngeal swab specimens and should not be used as a sole basis for treatment. Nasal washings and aspirates are unacceptable for Xpert Xpress SARS-CoV-2/FLU/RSV testing.  Fact Sheet for Patients: EntrepreneurPulse.com.au  Fact Sheet for Healthcare Providers: IncredibleEmployment.be  This test is not yet approved or cleared by the [place] FDA and has been authorized for detection and/or diagnosis of SARS-CoV-2 by FDA under an Emergency Use Authorization (EUA). This EUA will remain in effect (meaning this test can be used) for the duration of the COVID-19 declaration under Section 564(b)(1) of the Act, 21 U.S.C. section 360bbb-3(b)(1), unless the authorization is terminated or revoked.  Performed at [facility], [address], [place], [place] [address]   MRSA Next Gen by PCR, Nasal     Status: Abnormal   Collection Time: 03/23/2021  4:08 PM   Specimen: Nasal Mucosa; Nasal Swab  Result Value Ref Range Status   MRSA by PCR Next Gen DETECTED (A) NOT DETECTED Final    Comment:  RESULT CALLED TO, READ BACK BY AND VERIFIED WITH: ASHLEY SHELTON @ 6045 ON 04/15/2021 C VARNER (NOTE) The GeneXpert MRSA Assay (FDA approved for NASAL specimens only), is one component of a comprehensive MRSA colonization surveillance program. It is not intended to diagnose MRSA infection nor to guide or monitor treatment for MRSA infections. Test performance is not FDA approved in patients less than 75 years old. Performed at [facility], [address], [place], [place] [address]   Culture, body fluid w Gram Stain-bottle     Status: None   Collection Time: 03/23/2021  4:18 PM   Specimen: Pleura  Result Value Ref Range Status   Specimen Description PLEURAL  Final   Special Requests   Final    BOTTLES DRAWN AEROBIC AND ANAEROBIC Blood Culture adequate volume   Culture   Final    NO GROWTH 5 DAYS Performed at [facility], [address] [place], [place] [address]    Report Status 04/08/2021 FINAL  Final  Gram stain     Status: None   Collection Time: 03/20/2021  4:18 PM   Specimen: Pleura  Result Value Ref  Range Status   Specimen Description PLEURAL  Final   Special Requests NONE  Final   Gram Stain   Final    WBC PRESENT,BOTH PMN AND MONONUCLEAR NO ORGANISMS SEEN CYTOSPIN SMEAR Performed at [facility], [address] [place], [place] [address]    Report Status 03/31/2021 FINAL  Final  Culture, blood (Routine X 2) w Reflex to ID Panel     Status: None   Collection Time: 04/08/2021  7:50 PM   Specimen: BLOOD RIGHT ARM  Result Value Ref Range Status   Specimen Description BLOOD RIGHT ARM  Final   Special Requests   Final    BOTTLES DRAWN AEROBIC AND ANAEROBIC Blood Culture results may not be optimal due to an excessive volume of blood received in culture bottles   Culture   Final    NO GROWTH 5 DAYS Performed at [facility], [address] [place], [place] [address]    Report Status 04/08/2021 FINAL  Final  Culture, blood (Routine X 2) w Reflex to ID Panel     Status: None    Collection Time: 04/06/2021  7:50 PM   Specimen: BLOOD LEFT HAND  Result Value Ref Range Status   Specimen Description BLOOD LEFT HAND  Final   Special Requests   Final    BOTTLES DRAWN AEROBIC AND ANAEROBIC Blood Culture adequate volume   Culture   Final    NO GROWTH 5 DAYS Performed at [facility], [address], [place], [place] [address]    Report Status 04/08/2021 FINAL  Final  Body fluid culture w Gram Stain     Status: None   Collection Time: 04/04/21  1:02 PM   Specimen: Body Fluid  Result Value Ref Range Status   Specimen Description FLUID  Final   Special Requests  PLEURAL  Final   Gram Stain   Final    ABUNDANT WBC PRESENT, PREDOMINANTLY MONONUCLEAR NO ORGANISMS SEEN    Culture   Final    NO GROWTH 3 DAYS Performed at [facility], [address], [place], [place] [address]    Report Status 04/07/2021 FINAL  Final  Fungus Culture With Stain     Status: None (Preliminary result)   Collection Time: 04/04/21  1:03 PM   Specimen: Pleural Fluid  Result Value Ref Range Status   Fungus Stain Final report  Final    Comment: (NOTE) Performed At: [facility], [place] [id number] Rush Farmer MD UG:[id number]    Fungus (Mycology) Culture PENDING  Incomplete   Fungal Source FLUID  Final    Comment: Performed at [facility], [place] [address], [place],  [address]  Fungus Culture Result     Status: None   Collection Time: 04/04/21  1:03 PM  Result Value Ref Range Status   Result 1 Comment  Final    Comment: (NOTE) KOH/Calcofluor preparation:  no fungus observed. Performed At: [facility] [place], [place] [id number] Rush Farmer MD AV:[id number]   MRSA Next Gen by PCR, Nasal     Status: Abnormal   Collection Time: 04/04/21  3:52 PM   Specimen: Nasal Mucosa; Nasal Swab  Result Value Ref Range Status   MRSA by PCR Next Gen DETECTED (A) NOT DETECTED Final    Comment: RESULT CALLED TO, READ BACK  BY AND VERIFIED WITH: Brandt Loosen RN [id number] 04/04/21 A BROWNING (NOTE) The GeneXpert MRSA Assay (FDA approved for NASAL specimens only), is one component  of a comprehensive MRSA colonization surveillance program. It is not intended to diagnose MRSA infection nor to guide or monitor treatment for MRSA infections. Test performance is not FDA approved in patients less than 58 years old. Performed at [facility], [place] [address], [place], [place] [address]       Radiology Studies: DG CHEST PORT 1 VIEW  Result Date: 04/10/2021 CLINICAL DATA:  Shortness of breath EXAM: PORTABLE CHEST 1 VIEW COMPARISON:  Chest x-ray dated April 07, 2021 FINDINGS: Visualized cardiac and mediastinal contours are unchanged. Moderate right pleural effusion is increased in size compared to prior exam. New small left pleural effusions. Increased lower lung predominant opacities. No evidence of pneumothorax. IMPRESSION: 1. Increased moderate right pleural effusion and new small left pleural effusion. 2. Increased lower lung predominant opacities, likely a combination of pulmonary edema and atelectasis Electronically Signed   By: Yetta Glassman M.D.   On: 04/10/2021 09:49    Scheduled Meds:  amiodarone  200 mg Oral Daily   atorvastatin  40 mg Oral QPM   busPIRone  7.5 mg Oral BID   chlorhexidine  15 mL Mouth Rinse BID   fluticasone furoate-vilanterol  1 puff Inhalation Daily   insulin aspart  0-15 Units Subcutaneous TID WC   insulin glargine-yfgn  10 Units Subcutaneous Daily   ipratropium  2 spray Each Nare BID   magnesium oxide  400 mg Oral Daily   mouth rinse  15 mL Mouth Rinse q12n4p   midodrine  10 mg Oral TID WC   pantoprazole  40 mg Oral BID   polyethylene glycol  17 g Oral Daily   sildenafil  20 mg Oral TID   torsemide  20 mg Oral BID   umeclidinium bromide  1 puff Inhalation Daily   Continuous Infusions:  sodium chloride Stopped (04/06/21 1445)     LOS: 8 days   Time spent: 35  minutes.  Vernell Leep, MD,  FACP, [facility], [facility] (Care Management Physician Certified) [facility] [place]  To contact the attending provider between 7A-7P or the covering provider during after hours 7P-7A, please log into the web site [email or web address] and access using universal [place] password for that web site. If you do not have the password, please call the hospital operator.

## 2021-04-11 NOTE — Progress Notes (Signed)
NAME:  Gary [name], MRN:  [id number], DOB:  [birth year], LOS: 8 ADMISSION DATE:  03/25/2021, CONSULTATION DATE:  04/11/2021 REFERRING MD:  Dr. Karle Starch, ER, CHIEF COMPLAINT:  Short of breath   History of Present Illness:  70 yo male former smoker presented to [facility] ER with dyspnea and cough.  He has COPD and uses 6 liters oxygen.  He was to have thoracentesis for right pleural effusion by Dr. Valeta Harms on 04/04/21.  He had eliquis held in anticipation of this.  First noted to have pleural effusions in April 2022.  He was started on sildenafil for pulmonary hypertension during hospitalization in May 2022.  He had right thoracentesis done in May and July of 2022.  In ER he was started on supplemental oxygen and Bipap.  He was found to have anemia with hemoglobin 7.  He had EGD in April 2022 that showed large duodenal ulcer, short segments of Barrett's esophagus, and erosive gastritis.  He reports his symptoms started on 03/30/21.  He isn't aware of having blood in his stool or melena.  He denies chest pain or fever.  Pertinent  Medical History  A fib, DM type 2, HLD, HTN, Pulmonary hypertension, Barrett's esophagus, CAD, PUD with upper GI bleeding, COPD, +ANA, Pneumococcal pneumonia April 2022  Studies:  Rt thoracentesis 09/18/20 >> 1200 ml fluid, glucose 220, protein < 3, LDH 139, 790 cells (92% L), cytology negative Rt thoracentesis 12/04/20 >> glucose 174, protein 4, LDH 124, 810 cells (93% L), cytology negative Echo 12/04/20 >> EF 55 to 60%, mod LVH, severe RV systolic dysfx, mod RV enlargement, RVSP 59.5 mmHg, mod LA and RA dilation CT chest 12/31/20 >> atherosclerosis, 2.4 cm subcarinal LN, 2.3 cm Rt hilar LN, 2 cm precarinal LN, multiloculated Rt pleural effusion  Interim History / Subjective:   On 100% and 25L   Objective   Blood pressure (!) 105/59, pulse 78, temperature 98.4 F (36.9 C), temperature source Oral, resp. rate 20, height 5' 7"  (1.702 m), weight 68.8 kg, SpO2 96 %.     FiO2 (%):  [70 %-100 %] 80 %   Intake/Output Summary (Last 24 hours) at 04/11/2021 1601 Last data filed at 04/11/2021 1500 Gross per 24 hour  Intake 240 ml  Output 2750 ml  Net -2510 ml   Filed Weights   04/11/2021 1529 04/04/21 0414 04/04/21 1137  Weight: 68.4 kg 70.2 kg 68.8 kg    Examination: Gen: chronically ill appearing CV: RRR, s1 s2  Resp: crackles, no wheeze, diminished in bases  Ext: no edema  Abd: NT ND   Resolved Hospital Problem list     Assessment & Plan:  Gary [name] is a 70 y.o. 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  - I told him that we could try and see if he would tolerate  - with his increasing o2 requirements he may not - long discussion with wife and patient at bedside today  - I am not optimistic that he will recover enough to even consider making it home with hospice.   Pulmonary hypertension. -Continue to hold sildenafil, currently on  midodrine  Anemia with hx of duodenal ulcer. -Follow CBC Atrial fibrillation. -Eliquis on hold in prep for Pleurx catheter placement   Labs   CBC: Recent Labs  Lab 04/06/21 0150 04/07/21 0207 04/09/21 0218  WBC 12.2* 12.1* 12.1*  HGB 8.2* 8.1* 8.0*  HCT 27.8* 27.4* 27.1*  MCV 81.5 81.5 82.1  PLT 426* 447* 469*    Basic Metabolic Panel: Recent Labs  Lab 04/05/21 0256 04/06/21 0150 04/07/21 0207 04/09/21 0218 04/11/21 0154  NA 135 134* 137 136 137  K 3.7 4.2 4.0 3.3* 3.6  CL 94* 95* 97* 96* 94*  CO2 29 30 32 33* 34*  GLUCOSE 139* 151* 160* 138* 121*  BUN 22 22 21 20 22    CREATININE 0.99 1.05 0.99 0.90 0.85  CALCIUM 8.4* 8.2* 7.8* 7.9* 8.2*  MG 1.9  --   --   --   --    GFR: Estimated Creatinine Clearance: 73.4 mL/min (by C-G formula based on SCr of 0.85 mg/dL). Recent Labs  Lab 04/06/21 0150 04/07/21 0207 04/09/21 0218  WBC 12.2* 12.1* 12.1*    Liver Function Tests: No results for input(s): AST, ALT, ALKPHOS, BILITOT, PROT, ALBUMIN in the last 168 hours. No results for input(s): LIPASE, AMYLASE in the last 168 hours. No results for input(s): AMMONIA in the last 168 hours.  ABG    Component Value Date/Time   PHART 7.484 (H) 04/02/2021 1003   PCO2ART 44.0 03/29/2021 1003   PO2ART 59.0 (L) 03/27/2021 1003   HCO3 32.4 (H) 03/19/2021 1003   TCO2 33 (H) 09/24/2020 1516   ACIDBASEDEF 4.0 (H) 09/07/2020 2236   O2SAT 89.3 04/14/2021 1003     Coagulation Profile: No results for input(s): INR, PROTIME in the last 168 hours.   Cardiac Enzymes: No results for input(s): CKTOTAL, CKMB, CKMBINDEX, TROPONINI in the last 168 hours.  HbA1C: Hgb A1c MFr Bld  Date/Time Value Ref Range Status  03/26/2021 09:58 AM 6.9 (H) 4.8 - 5.6 % Final    Comment:    (NOTE) Pre diabetes:          5.7%-6.4%  Diabetes:              >6.4%  Glycemic control for   <7.0% adults with diabetes   01/11/2021 02:54 AM 7.4 (H) 4.8 - 5.6 % Final    Comment:    (NOTE) Pre diabetes:          5.7%-6.4%  Diabetes:              >6.4%  Glycemic control for   <7.0% adults with diabetes     CBG: Recent Labs  Lab 04/10/21 1134 04/10/21 1547 04/10/21 2147 04/11/21 0735 04/11/21 1202  GLUCAP 165* 162* 119* 130* 192*     Garner Nash, DO [facility] 04/11/2021 4:01 PM

## 2021-04-12 ENCOUNTER — Inpatient Hospital Stay (HOSPITAL_COMMUNITY): Payer: Medicare Other

## 2021-04-12 ENCOUNTER — Encounter (HOSPITAL_COMMUNITY): Admission: EM | Disposition: E | Payer: Self-pay | Source: Home / Self Care | Attending: Family Medicine

## 2021-04-12 DIAGNOSIS — Z7189 Other specified counseling: Secondary | ICD-10-CM | POA: Diagnosis not present

## 2021-04-12 DIAGNOSIS — J910 Malignant pleural effusion: Secondary | ICD-10-CM | POA: Diagnosis not present

## 2021-04-12 DIAGNOSIS — Z515 Encounter for palliative care: Secondary | ICD-10-CM | POA: Diagnosis not present

## 2021-04-12 DIAGNOSIS — J90 Pleural effusion, not elsewhere classified: Secondary | ICD-10-CM | POA: Diagnosis not present

## 2021-04-12 DIAGNOSIS — J9621 Acute and chronic respiratory failure with hypoxia: Secondary | ICD-10-CM | POA: Diagnosis not present

## 2021-04-12 DIAGNOSIS — C3491 Malignant neoplasm of unspecified part of right bronchus or lung: Secondary | ICD-10-CM | POA: Diagnosis not present

## 2021-04-12 DIAGNOSIS — Z9889 Other specified postprocedural states: Secondary | ICD-10-CM | POA: Diagnosis not present

## 2021-04-12 LAB — GLUCOSE, CAPILLARY
Glucose-Capillary: 141 mg/dL — ABNORMAL HIGH (ref 70–99)
Glucose-Capillary: 168 mg/dL — ABNORMAL HIGH (ref 70–99)
Glucose-Capillary: 183 mg/dL — ABNORMAL HIGH (ref 70–99)
Glucose-Capillary: 164 mg/dL — ABNORMAL HIGH (ref 70–99)

## 2021-04-12 SURGERY — CANCELLED PROCEDURE
Laterality: Right

## 2021-04-12 MED ORDER — SODIUM CHLORIDE 0.9 % IV BOLUS
500.0000 mL | Freq: Once | INTRAVENOUS | Status: AC
  Administered 2021-04-12: 500 mL via INTRAVENOUS

## 2021-04-12 NOTE — Plan of Care (Signed)
  Problem: Education: Goal: Knowledge of General Education information will improve Description: Including pain rating scale, medication(s)/side effects and non-pharmacologic comfort measures 04/04/2021 1810 by Drucie Ip I, RN Outcome: Progressing 04/05/2021 1809 by Drucie Ip I, RN Outcome: Progressing   Problem: Health Behavior/Discharge Planning: Goal: Ability to manage health-related needs will improve 04/10/2021 1810 by Drucie Ip I, RN Outcome: Progressing 03/27/2021 1809 by Drucie Ip I, RN Outcome: Progressing   Problem: Clinical Measurements: Goal: Ability to maintain clinical measurements within normal limits will improve 04/15/2021 1810 by Drucie Ip I, RN Outcome: Progressing 03/21/2021 1809 by Drucie Ip I, RN Outcome: Progressing Goal: Will remain free from infection 04/08/2021 1810 by Drucie Ip I, RN Outcome: Progressing 04/09/2021 1809 by Drucie Ip I, RN Outcome: Progressing Goal: Diagnostic test results will improve 04/02/2021 1810 by Drucie Ip I, RN Outcome: Progressing 04/05/2021 1809 by Drucie Ip I, RN Outcome: Progressing Goal: Respiratory complications will improve 04/01/2021 1810 by Drucie Ip I, RN Outcome: Progressing 04/04/2021 1809 by Drucie Ip I, RN Outcome: Progressing Goal: Cardiovascular complication will be avoided 04/16/2021 1810 by Drucie Ip I, RN Outcome: Progressing 04/07/2021 1809 by Drucie Ip I, RN Outcome: Progressing   Problem: Activity: Goal: Risk for activity intolerance will decrease 04/04/2021 1810 by Drucie Ip I, RN Outcome: Progressing 04/02/2021 1809 by Drucie Ip I, RN Outcome: Progressing   Problem: Elimination: Goal: Will not experience complications related to urinary retention 04/10/2021 1810 by Drucie Ip I, RN Outcome: Progressing 04/14/2021 1809 by Drucie Ip I, RN Outcome:  Progressing   Problem: Safety: Goal: Ability to remain free from injury will improve 03/28/2021 1810 by Drucie Ip I, RN Outcome: Progressing 03/28/2021 1809 by Drucie Ip I, RN Outcome: Progressing   Problem: Skin Integrity: Goal: Risk for impaired skin integrity will decrease 03/26/2021 1810 by Drucie Ip I, RN Outcome: Progressing 04/11/2021 1809 by Drucie Ip I, RN Outcome: Progressing

## 2021-04-12 NOTE — Progress Notes (Signed)
Daily Progress Note   Patient Name: Gary [name]       Date: 03/20/2021 DOB: 07-14-1950  Age: 70 y.o. MRN#: [id number] Attending Physician: Modena Jansky, MD Primary Care Physician: Riley Lam Admit Date: 04/14/2021  Reason for Consultation/Follow-up: Establishing goals of care  Subjective: [name] denies pain, denies feeling short of breath - feels "like I've been run over by a truck", feeling sad that drain could not be placed today  Length of Stay: 9  Current Medications: Scheduled Meds:   amiodarone  200 mg Oral Daily   atorvastatin  40 mg Oral QPM   busPIRone  7.5 mg Oral BID   chlorhexidine  15 mL Mouth Rinse BID   fluticasone furoate-vilanterol  1 puff Inhalation Daily   insulin aspart  0-15 Units Subcutaneous TID WC   insulin glargine-yfgn  10 Units Subcutaneous Daily   ipratropium  2 spray Each Nare BID   magnesium oxide  400 mg Oral Daily   mouth rinse  15 mL Mouth Rinse q12n4p   midodrine  10 mg Oral TID WC   pantoprazole  40 mg Oral BID   polyethylene glycol  17 g Oral Daily   sildenafil  20 mg Oral TID   torsemide  20 mg Oral BID   umeclidinium bromide  1 puff Inhalation Daily    Continuous Infusions:  sodium chloride Stopped (04/06/21 1445)    PRN Meds: acetaminophen **OR** acetaminophen, LORazepam, menthol-cetylpyridinium, morphine, ondansetron **OR** ondansetron (ZOFRAN) IV, phenol, sodium chloride  Physical Exam Constitutional:      General: He is not in acute distress.    Comments: Lethargic but wakes easily to voice and can respond to questions  Pulmonary:     Comments: Remains on HFNC at 40L Skin:    General: Skin is warm and dry.  Neurological:     Mental Status: He is oriented to person, place, and time.            Vital Signs: BP 119/72  (BP Location: Right Arm)   Pulse 82   Temp 98.6 F (37 C) (Axillary)   Resp 18   Ht 5' 7"  (1.702 m)   Wt 68.8 kg   SpO2 96%   BMI 23.76 kg/m  SpO2: SpO2: 96 % O2 Device: O2 Device: High Flow Nasal Cannula O2 Flow Rate: O2 Flow Rate (L/min): 35 L/min  Intake/output summary:  Intake/Output Summary (Last 24 hours) at 04/05/2021 1619 Last data filed at 04/15/2021 1500 Gross per 24 hour  Intake 120 ml  Output 750 ml  Net -630 ml   LBM: Last BM Date: 04/08/21 Baseline Weight: Weight: 76.9 kg Most recent weight: Weight: 68.8 kg       Palliative Assessment/Data: PPS 40%      Patient Active Problem List   Diagnosis Date Noted   Malignant pleural effusion    Acute on chronic respiratory failure with hypoxia and hypercapnia (HCC) 03/24/2021   Acute on chronic diastolic CHF (congestive heart failure) ([place]) 03/31/2021   Anemia    Pulmonary hypertension (HCC)    Small bowel obstruction ([place]) 01/10/2021   SBO (small bowel obstruction) ([place]) 01/10/2021   Ileitis, terminal ([place]) 01/10/2021   Microcytic anemia  01/10/2021   S/P thoracentesis    Acute and chronic respiratory failure with hypoxia ([place]) 12/02/2020   Lung mass 10/15/2020   Acute blood loss anemia 10/15/2020   Chronic respiratory failure ([place]) 10/09/2020   COPD, severe ([place]) 10/09/2020   Hypoalbuminemia due to protein-calorie malnutrition (HCC)    Hyponatremia    Leukocytosis    Hypomagnesemia    Supplemental oxygen dependent    Debility 10/02/2020   ARDS (adult respiratory distress syndrome) (HCC)    Pleural effusion    Atypical atrial flutter (HCC)    Acute on chronic right heart failure (HCC)    Acute congestive heart failure ([place]) 09/03/2020   Atrial fibrillation, chronic ([place]) 09/03/2020   Acute respiratory failure with hypoxia ([place]) 09/03/2020   Right bundle branch block 09/03/2020   Hyperlipidemia due to type 2 diabetes mellitus [facility])     Palliative Care Assessment & Plan   HPI: 70 y.o. male  with  past medical history of a. Fib, pulmonary hypertension, CHF- cor pulmonale (R ventricular failure), DM2, hypothyroidism, anxiety with associated panic attacks, HLD, HTN, GERD, R lung mass with associated lymphadenopathy (have not been able to biopsy), recurrent pleural effusions s/p several thoracentises, chronic respiratory failure on home oxygen,  admitted on 03/20/2021 with worsening shortness of breath. Palliative medicine consulted for goals of care discussion in light of significant lung disease that continues to worsen.   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 and family have good understanding of situation and are taking time to process events of today They would like to continue current level of care - continue HFNC and continue all meds Wife does share that her ultimate goal is that patient not suffer We reviewed that patient has PRN meds available for anxiety, pain, and shortness of breath - per chart review, these have not been utilized  Recommendations/Plan: Continue current measures, PRN meds available for anxiety, pain, and shortness of breath Patient and family processing events from today Will ask PMT member to follow up in coming days  Code Status: DNR  Care plan was discussed with patient and wife [name]  Thank you for allowing the Palliative Medicine Team to assist in the care of this patient.   Total Time 20 minutes Prolonged Time Billed  no       Greater than 50%  of this time was spent counseling and coordinating care related to the above assessment and plan.  Juel Burrow, DNP, [facility] Palliative  Medicine Team Team Phone # [phone]  Pager [phone]

## 2021-04-12 NOTE — Progress Notes (Signed)
PCCM Progress Note   Post thoracentesis CXR revealed moderate right hydropneumothorax, loculated in nature with some right chest wall emphysema. On chart review this presentation is seen with each thoracentesis. Patient has entrapped lung with pneumothorax ex vacuo. No acute need for chest tube at this time. Patient in no acute distress. Will repeat CXR in am. If patient were to become more hypoxic or develop worsened respiratory distress chest tube should be considered   Anatasia Tino D. Kenton Kingfisher, NP-C [facility] Personal contact information can be found on Amion  04/11/2021, 6:04 PM

## 2021-04-12 NOTE — Progress Notes (Addendum)
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 Initial thoracentesis of loculated pleural effusion was unsuccessful. He was transferred to ICU at [facility] where 2L blood tinged fluid was drained with right thoracentesis on 11/17. Respiratory distress has continued despite diuretic and antibiotics after thoracentesis.  11/25: Patient unable to lie flat for Pleurx catheter, PCCM will try bedside left thoracentesis for symptom relief.  Requested palliative care to come back on board to discuss [place], would not be a chemotherapy candidate at this time, likely hospice-home versus residential.  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   - Pt currently requiring 40L at 80% HHFNC, BiPAP overnight. PCCM and palliative continue to follow.  - To assist with symptom burden, PCCM considered pleurx placement on 11/25 but patient unable to lie flat for the procedure and hence they are planning to  consider bedside left thoracentesis for symptom relief.  As needed morphine for air hunger. - Cytology from thoracenteses 11/16: Shows malignant cells consistent with non-small cell lung cancer - Completed vancomycin, zosyn x7 days. - Continue inhaled therapies -Per pulmonology, suspects that we are heading for hospice care, palliative care and agree with that.  - Management as noted above.  Communicated with Dr. Valeta Harms, PCCM.  Not a candidate for chemotherapy at this time.  He feels that patient needs transition to comfort care in hospital. -Patient now aware of his cancer diagnosis.  Addendum Since earlier note, pulmonology was able to drain 1.5 L pleural fluid from right side.  Postprocedure chest x-ray showed hydropneumothorax, PCCM aware and no acute indication for chest tube.  Following serial chest x-rays.  They are to be called if he worsens Also palliative care medicine have followed up & spouse not yet on board regarding transitioning to comfort care and wishes to "process things".  PAF: Remains in sinus rhythm with BBB morphology. - Continue amiodarone -Eliquis was on hold for Pleurx placement but now that Pleurx cannot be placed, may either consider resuming post thoracentesis or discontinue if heads to full comfort care.  Hypotension:  - Continue midodrine at high dose. Hold parameters for torsemide, slidenafil placed. - Ongoing intermittent hypotension.  Hx PUD:  - PPI BID  Chronic HFpEF:  - Tx HTN, continue baseline torsemide  Hypokalemia: -Replaced.  Anemia of chronic disease:  - Roughly stable hgb in 8's. No bleeding reported.  Pulmonary HTN:  - Continue sildenafil.   T2DM: HbA1c 6.9%.  - Continue glargine insulin 10u and mod SSI.  Reasonable control.  HLD:  - Continue statin  Anxiety:  - Continue buspar.    DVT prophylaxis: Eliquis currently  on hold for Pleurx.  Added SCDs. Code Status: DNR Family Communication: Discussed with spouse via phone 11/24.   Dr. Valeta Harms has discussed with her again today. Disposition Plan:  Status is: Inpatient  Remains inpatient appropriate because: Continued respiratory failure.  Needing left thoracentesis.  Consultants:  PCCM Palliative  Procedures:  Right thoracentesis 11/16, 11/18  Cytopathology: Specimen Submitted:  A. PLEURAL FLUID, RIGHT, THORACENTESIS:    FINAL MICROSCOPIC DIAGNOSIS:  - Malignant cells consistent with non-small cell carcinoma   Antimicrobials: Vancomycin, zosyn   Subjective: Seen this morning.  RT at bedside.  Came off of BiPAP from overnight.  Denies complaints.  Breathing okay.  Objective: Vitals:   04/11/21 2111 04/11/21 2200 04/01/2021 0600 03/29/2021 0904  BP: (!) 100/56  119/72   Pulse:  74 72 80  Resp:  (!) 27 (!) 21 20  Temp:   98.6 F (37 C)   TempSrc:   Axillary   SpO2:   100%   Weight:      Height:        Intake/Output Summary (Last 24 hours) at 03/23/2021 1304 Last data filed at 04/11/2021 2124 Gross per 24 hour  Intake 120 ml  Output 1550 ml  Net -1430 ml   Filed Weights   04/13/2021 1529 04/04/21 0414 04/04/21 1137  Weight: 68.4 kg 70.2 kg 68.8 kg   Gen: Ill-appearing, tired, very pleasant male, moderately built, poorly nourished had his BiPAP on from last night.  RT here to change it to HFNC. Pulm: Absent breath sounds lower half of right lung fields.  Rest of lung fields with diminished/harsh breath sounds.  No wheezing, rhonchi or crackles.  Currently without increased work of breathing while laying still in bed. CV: S1 and S2 heard, RRR.  No JVD, murmurs or pedal edema.  Telemetry personally reviewed: SR with BBB morphology. GI: Abdomen soft, non-tender, non-distended, with normoactive bowel sounds.  Ext: Warm, no deformities Skin: No new rashes, lesions or ulcers on visualized skin. Neuro: Alert and oriented, follows conversation but does get sleepy quickly during conversation. There are no focal neurological deficits. Psych: Judgement and  insight appear marginal. Mood euthymic & affect congruent. Behavior is appropriate.    Data Reviewed: I have personally reviewed following labs and imaging studies  CBC: Recent Labs  Lab 04/06/21 0150 04/07/21 0207 04/09/21 0218  WBC 12.2* 12.1* 12.1*  HGB 8.2* 8.1* 8.0*  HCT 27.8* 27.4* 27.1*  MCV 81.5 81.5 82.1  PLT 426* 447* 778*   Basic Metabolic Panel: Recent Labs  Lab 04/06/21 0150 04/07/21 0207 04/09/21 0218 04/11/21 0154  NA 134* 137 136 137  K 4.2 4.0 3.3* 3.6  CL 95* 97* 96* 94*  CO2 30 32 33* 34*  GLUCOSE 151* 160* 138* 121*  BUN 22 21 20 22   CREATININE 1.05 0.99 0.90 0.85  CALCIUM 8.2* 7.8* 7.9* 8.2*   GFR: Estimated Creatinine Clearance: 73.4 mL/min (by C-G formula based on SCr of 0.85 mg/dL).  CBG: Recent Labs  Lab 04/11/21 1202 04/11/21 1634 04/11/21 2108 04/11/2021 0753 04/10/2021 1137  GLUCAP 192* 252* 163* 141* 168*   Recent Results (from the past 240 hour(s))  Resp Panel by RT-PCR (Flu A&B, Covid) Nasopharyngeal Swab     Status: None   Collection Time: 04/11/2021 10:11 AM   Specimen: Nasopharyngeal Swab; Nasopharyngeal(NP) swabs in vial transport medium  Result Value Ref Range Status   SARS Coronavirus 2 by RT PCR NEGATIVE NEGATIVE Final    Comment: (NOTE) SARS-CoV-2 target nucleic acids are  NOT DETECTED.  The SARS-CoV-2 RNA is generally detectable in upper respiratory specimens during the acute phase of infection. The lowest concentration of SARS-CoV-2 viral copies this assay can detect is 138 copies/mL. A negative result does not preclude SARS-Cov-2 infection and should not be used as the sole basis for treatment or other patient management decisions. A negative result may occur with  improper specimen collection/handling, submission of specimen other than nasopharyngeal swab, presence of viral mutation(s) within the areas targeted by this assay, and inadequate number of viral copies(<138 copies/mL). A negative result must be combined  with clinical observations, patient history, and epidemiological information. The expected result is Negative.  Fact Sheet for Patients:  EntrepreneurPulse.com.au  Fact Sheet for Healthcare Providers:  IncredibleEmployment.be  This test is no t yet approved or cleared by the [place] FDA and  has been authorized for detection and/or diagnosis of SARS-CoV-2 by FDA under an Emergency Use Authorization (EUA). This EUA will remain  in effect (meaning this test can be used) for the duration of the COVID-19 declaration under Section 564(b)(1) of the Act, 21 U.S.C.section 360bbb-3(b)(1), unless the authorization is terminated  or revoked sooner.       Influenza A by PCR NEGATIVE NEGATIVE Final   Influenza B by PCR NEGATIVE NEGATIVE Final    Comment: (NOTE) The Xpert Xpress SARS-CoV-2/FLU/RSV plus assay is intended as an aid in the diagnosis of influenza from Nasopharyngeal swab specimens and should not be used as a sole basis for treatment. Nasal washings and aspirates are unacceptable for Xpert Xpress SARS-CoV-2/FLU/RSV testing.  Fact Sheet for Patients: EntrepreneurPulse.com.au  Fact Sheet for Healthcare Providers: IncredibleEmployment.be  This test is not yet approved or cleared by the [place] FDA and has been authorized for detection and/or diagnosis of SARS-CoV-2 by FDA under an Emergency Use Authorization (EUA). This EUA will remain in effect (meaning this test can be used) for the duration of the COVID-19 declaration under Section 564(b)(1) of the Act, 21 U.S.C. section 360bbb-3(b)(1), unless the authorization is terminated or revoked.  Performed at [facility], [address], [place], [place] [address]   MRSA Next Gen by PCR, Nasal     Status: Abnormal   Collection Time: 03/19/2021  4:08 PM   Specimen: Nasal Mucosa; Nasal Swab  Result Value Ref Range Status   MRSA by PCR Next Gen DETECTED  (A) NOT DETECTED Final    Comment: RESULT CALLED TO, READ BACK BY AND VERIFIED WITH: ASHLEY SHELTON @ 0539 ON 04/09/2021 C VARNER (NOTE) The GeneXpert MRSA Assay (FDA approved for NASAL specimens only), is one component of a comprehensive MRSA colonization surveillance program. It is not intended to diagnose MRSA infection nor to guide or monitor treatment for MRSA infections. Test performance is not FDA approved in patients less than 4 years old. Performed at [facility], [address] [place], [place] [address]   Culture, body fluid w Gram Stain-bottle     Status: None   Collection Time: 04/11/2021  4:18 PM   Specimen: Pleura  Result Value Ref Range Status   Specimen Description PLEURAL  Final   Special Requests   Final    BOTTLES DRAWN AEROBIC AND ANAEROBIC Blood Culture adequate volume   Culture   Final    NO GROWTH 5 DAYS Performed at [facility], [address] [place], [place] [address]    Report Status 04/08/2021 FINAL  Final  Gram stain     Status: None   Collection Time: 04/04/2021  4:18 PM  Specimen: Pleura  Result Value Ref Range Status   Specimen Description PLEURAL  Final   Special Requests NONE  Final   Gram Stain   Final    WBC PRESENT,BOTH PMN AND MONONUCLEAR NO ORGANISMS SEEN CYTOSPIN SMEAR Performed at [facility], [address], [place], [place] [address]    Report Status 03/27/2021 FINAL  Final  Culture, blood (Routine X 2) w Reflex to ID Panel     Status: None   Collection Time: 04/08/2021  7:50 PM   Specimen: BLOOD RIGHT ARM  Result Value Ref Range Status   Specimen Description BLOOD RIGHT ARM  Final   Special Requests   Final    BOTTLES DRAWN AEROBIC AND ANAEROBIC Blood Culture results may not be optimal due to an excessive volume of blood received in culture bottles   Culture   Final    NO GROWTH 5 DAYS Performed at [facility], [address] [place], [place] [address]    Report Status 04/08/2021 FINAL  Final  Culture, blood (Routine X 2) w  Reflex to ID Panel     Status: None   Collection Time: 04/14/2021  7:50 PM   Specimen: BLOOD LEFT HAND  Result Value Ref Range Status   Specimen Description BLOOD LEFT HAND  Final   Special Requests   Final    BOTTLES DRAWN AEROBIC AND ANAEROBIC Blood Culture adequate volume   Culture   Final    NO GROWTH 5 DAYS Performed at [facility], [address], [place], [place] [address]    Report Status 04/08/2021 FINAL  Final  Body fluid culture w Gram Stain     Status: None   Collection Time: 04/04/21  1:02 PM   Specimen: Body Fluid  Result Value Ref Range Status   Specimen Description FLUID  Final   Special Requests  PLEURAL  Final   Gram Stain   Final    ABUNDANT WBC PRESENT, PREDOMINANTLY MONONUCLEAR NO ORGANISMS SEEN    Culture   Final    NO GROWTH 3 DAYS Performed at [facility], [address], [place], [place] [address]    Report Status 04/07/2021 FINAL  Final  Fungus Culture With Stain     Status: None (Preliminary result)   Collection Time: 04/04/21  1:03 PM   Specimen: Pleural Fluid  Result Value Ref Range Status   Fungus Stain Final report  Final    Comment: (NOTE) Performed At: [facility] [place], [place] [id number] Rush Farmer MD TO:[id number]    Fungus (Mycology) Culture PENDING  Incomplete   Fungal Source FLUID  Final    Comment: Performed at [facility], [place] [address] [place], [place] [address]  Fungus Culture Result     Status: None   Collection Time: 04/04/21  1:03 PM  Result Value Ref Range Status   Result 1 Comment  Final    Comment: (NOTE) KOH/Calcofluor preparation:  no fungus observed. Performed At: [facility] [place], [place] [id number] Rush Farmer MD AL:[id number]   MRSA Next Gen by PCR, Nasal     Status: Abnormal   Collection Time: 04/04/21  3:52 PM   Specimen: Nasal Mucosa; Nasal Swab  Result Value Ref Range Status   MRSA by PCR Next Gen DETECTED (A) NOT DETECTED Final     Comment: RESULT CALLED TO, READ BACK BY AND VERIFIED WITH: Brandt Loosen RN [id number] 04/04/21 A BROWNING (NOTE) The GeneXpert MRSA Assay (FDA approved for  NASAL specimens only), is one component of a comprehensive MRSA colonization surveillance program. It is not intended to diagnose MRSA infection nor to guide or monitor treatment for MRSA infections. Test performance is not FDA approved in patients less than 34 years old. Performed at [facility], [place] [address], [place], [place] [address]       Radiology Studies: No results found.  Scheduled Meds:  amiodarone  200 mg Oral Daily   atorvastatin  40 mg Oral QPM   busPIRone  7.5 mg Oral BID   chlorhexidine  15 mL Mouth Rinse BID   fluticasone furoate-vilanterol  1 puff Inhalation Daily   insulin aspart  0-15 Units Subcutaneous TID WC   insulin glargine-yfgn  10 Units Subcutaneous Daily   ipratropium  2 spray Each Nare BID   magnesium oxide  400 mg Oral Daily   mouth rinse  15 mL Mouth Rinse q12n4p   midodrine  10 mg Oral TID WC   pantoprazole  40 mg Oral BID   polyethylene glycol  17 g Oral Daily   sildenafil  20 mg Oral TID   torsemide  20 mg Oral BID   umeclidinium bromide  1 puff Inhalation Daily   Continuous Infusions:  sodium chloride Stopped (04/06/21 1445)     LOS: 9 days   Time spent: 35 minutes.  Vernell Leep, MD,  FACP, [facility], [facility], [facility] (Care Management Physician Certified) [facility]  To contact the attending provider between 7A-7P or the covering provider during after hours 7P-7A, please log into the web site [email or web address] and access using universal [place] password for that web site. If you do not have the password, please call the hospital operator.

## 2021-04-12 NOTE — Plan of Care (Signed)
  Problem: Education: Goal: Knowledge of General Education information will improve Description: Including pain rating scale, medication(s)/side effects and non-pharmacologic comfort measures Outcome: Progressing   Problem: Health Behavior/Discharge Planning: Goal: Ability to manage health-related needs will improve Outcome: Progressing   Problem: Clinical Measurements: Goal: Ability to maintain clinical measurements within normal limits will improve Outcome: Progressing Goal: Will remain free from infection Outcome: Progressing Goal: Diagnostic test results will improve Outcome: Progressing Goal: Respiratory complications will improve Outcome: Progressing Goal: Cardiovascular complication will be avoided Outcome: Progressing   Problem: Activity: Goal: Risk for activity intolerance will decrease Outcome: Progressing   Problem: Elimination: Goal: Will not experience complications related to urinary retention Outcome: Progressing   Problem: Safety: Goal: Ability to remain free from injury will improve Outcome: Progressing   Problem: Skin Integrity: Goal: Risk for impaired skin integrity will decrease Outcome: Progressing

## 2021-04-12 NOTE — Progress Notes (Signed)
OVERNIGHT COVERAGE CRITICAL CARE PROGRESS NOTE  CTSP re: hypotension and hypoxia.  The patient had a thoracentesis earlier today with subsequent identification of R loculated hydropneumothorax (pneumothorax ex vacuo).  Patient is currently on HFNC 30 LPM, FiO2 70% + 100% NRB with SpO2 100%.  He is awake, alert and mentating well.  Denies worsening dyspnea, in fact, confirms breathing has improved after thoracentesis today.  SBP 80s and MAP ~65.  I/O reveal that he is -1.5 L today with an ADDITIONAL -1.5 L from thoracentesis.  On exam, good air entry with diffuse crackles bilaterally.  Patient is also requesting that he be put back on CPAP (uses at home), which he finds more comfortable than HFNC.  NS 500 mL bolus.  Monitor blood pressure response. Stop HFNC and restart CPAP 16.  Titrate FiO2 to maintain SpO2 90+%.  Renee Pain, MD Board Certified by the ABIM, [place]

## 2021-04-12 NOTE — Progress Notes (Signed)
PT Cancellation Note  Patient Details Name: [name] MRN: [id number] DOB: 11-14-1950   Cancelled Treatment:    Reason Eval/Treat Not Completed: Patient at procedure or test/unavailable Pt having thoracentesis done. PT will follow back for treatment as able.  Rajan Burgard B. Migdalia Dk PT, DPT Acute Rehabilitation Services Pager [phone] Office [phone]   [place] 03/20/2021, 3:55 PM

## 2021-04-12 NOTE — Progress Notes (Signed)
NAME:  Gary [name], MRN:  [id number], DOB:  12-Aug-1950, LOS: 9 ADMISSION DATE:  03/30/2021, CONSULTATION DATE:  03/30/2021 REFERRING MD:  Dr. Karle Starch, ER, CHIEF COMPLAINT:  Short of breath   History of Present Illness:  70 yo male former smoker presented to [facility] ER with dyspnea and cough.  He has COPD and uses 6 liters oxygen.  He was to have thoracentesis for right pleural effusion by Dr. Valeta Harms on 04/04/21.  He had eliquis held in anticipation of this.  First noted to have pleural effusions in April 2022.  He was started on sildenafil for pulmonary hypertension during hospitalization in May 2022.  He had right thoracentesis done in May and July of 2022.  In ER he was started on supplemental oxygen and Bipap.  He was found to have anemia with hemoglobin 7.  He had EGD in April 2022 that showed large duodenal ulcer, short segments of Barrett's esophagus, and erosive gastritis.  He reports his symptoms started on 03/30/21.  He isn't aware of having blood in his stool or melena.  He denies chest pain or fever.  Pertinent  Medical History  A fib, DM type 2, HLD, HTN, Pulmonary hypertension, Barrett's esophagus, CAD, PUD with upper GI bleeding, COPD, +ANA, Pneumococcal pneumonia April 2022  Studies:  Rt thoracentesis 09/18/20 >> 1200 ml fluid, glucose 220, protein < 3, LDH 139, 790 cells (92% L), cytology negative Rt thoracentesis 12/04/20 >> glucose 174, protein 4, LDH 124, 810 cells (93% L), cytology negative Echo 12/04/20 >> EF 55 to 60%, mod LVH, severe RV systolic dysfx, mod RV enlargement, RVSP 59.5 mmHg, mod LA and RA dilation CT chest 12/31/20 >> atherosclerosis, 2.4 cm subcarinal LN, 2.3 cm Rt hilar LN, 2 cm precarinal LN, multiloculated Rt pleural effusion  Interim History / Subjective:   On 80% and 25L   Objective   Blood pressure 119/72, pulse 80, temperature 98.6 F (37 C), temperature source Axillary, resp. rate 20, height 5' 7"  (1.702 m), weight 68.8 kg, SpO2 100 %.    FiO2  (%):  [80 %] 80 %   Intake/Output Summary (Last 24 hours) at 04/11/2021 1254 Last data filed at 04/11/2021 2124 Gross per 24 hour  Intake 120 ml  Output 1550 ml  Net -1430 ml   Filed Weights   04/04/2021 1529 04/04/21 0414 04/04/21 1137  Weight: 68.4 kg 70.2 kg 68.8 kg    Examination: Gen: chronically ill appearing  CV: RRR, s1 s2  Resp: crackles, diminished in bases  Ext: no edema  Abd: NT ND   Resolved Hospital Problem list     Assessment & Plan:  Gary [name] is a 70 y.o. 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 - continue current meds  Anemia with hx of duodenal ulcer. -Follow CBC Atrial fibrillation. -eliquis on hold    Labs   CBC: Recent Labs  Lab 04/06/21 0150 04/07/21 0207 04/09/21 0218  WBC 12.2* 12.1* 12.1*  HGB 8.2* 8.1* 8.0*  HCT 27.8* 27.4*  27.1*  MCV 81.5 81.5 82.1  PLT 426* 447* 469*    Basic Metabolic Panel: Recent Labs  Lab 04/06/21 0150 04/07/21 0207 04/09/21 0218 04/11/21 0154  NA 134* 137 136 137  K 4.2 4.0 3.3* 3.6  CL 95* 97* 96* 94*  CO2 30 32 33* 34*  GLUCOSE 151* 160* 138* 121*  BUN 22 21 20 22   CREATININE 1.05 0.99 0.90 0.85  CALCIUM 8.2* 7.8* 7.9* 8.2*   GFR: Estimated Creatinine Clearance: 73.4 mL/min (by C-G formula based on SCr of 0.85 mg/dL). Recent Labs  Lab 04/06/21 0150 04/07/21 0207 04/09/21 0218  WBC 12.2* 12.1* 12.1*    Liver Function Tests: No results for input(s): AST, ALT, ALKPHOS, BILITOT, PROT,  ALBUMIN in the last 168 hours. No results for input(s): LIPASE, AMYLASE in the last 168 hours. No results for input(s): AMMONIA in the last 168 hours.  ABG    Component Value Date/Time   PHART 7.484 (H) 03/28/2021 1003   PCO2ART 44.0 04/07/2021 1003   PO2ART 59.0 (L) 03/24/2021 1003   HCO3 32.4 (H) 04/11/2021 1003   TCO2 33 (H) 09/24/2020 1516   ACIDBASEDEF 4.0 (H) 09/07/2020 2236   O2SAT 89.3 03/23/2021 1003     Coagulation Profile: No results for input(s): INR, PROTIME in the last 168 hours.   Cardiac Enzymes: No results for input(s): CKTOTAL, CKMB, CKMBINDEX, TROPONINI in the last 168 hours.  HbA1C: Hgb A1c MFr Bld  Date/Time Value Ref Range Status  04/15/2021 09:58 AM 6.9 (H) 4.8 - 5.6 % Final    Comment:    (NOTE) Pre diabetes:          5.7%-6.4%  Diabetes:              >6.4%  Glycemic control for   <7.0% adults with diabetes   01/11/2021 02:54 AM 7.4 (H) 4.8 - 5.6 % Final    Comment:    (NOTE) Pre diabetes:          5.7%-6.4%  Diabetes:              >6.4%  Glycemic control for   <7.0% adults with diabetes     CBG: Recent Labs  Lab 04/11/21 1202 04/11/21 1634 04/11/21 2108 03/20/2021 0753 04/07/2021 1137  GLUCAP 192* 252* 163* 141* 168*     Garner Nash, DO [facility] 03/26/2021 12:54 PM

## 2021-04-12 NOTE — Progress Notes (Signed)
H2O bottle changed on HHFNC

## 2021-04-12 NOTE — Procedures (Signed)
Thoracentesis  Procedure Note  Gary [name]  [id number]  [birth year]  Date:04/07/2021  Time:4:22 PM   Provider Performing:Roverto Bodmer L Kashawn Manzano   Procedure: Thoracentesis with imaging guidance (00511)  Indication(s) Pleural Effusion  Consent Risks of the procedure as well as the alternatives and risks of each were explained to the patient and/or caregiver.  Consent for the procedure was obtained and is signed in the bedside chart  Anesthesia Topical only with 1% lidocaine   Time Out Verified patient identification, verified procedure, site/side was marked, verified correct patient position, special equipment/implants available, medications/allergies/relevant history reviewed, required imaging and test results available.  Sterile Technique Maximal sterile technique including full sterile barrier drape, hand hygiene, sterile gown, sterile gloves, mask, hair covering, sterile ultrasound probe cover (if used).  Procedure Description Ultrasound was used to identify appropriate pleural anatomy for placement and overlying skin marked.  Area of drainage cleaned and draped in sterile fashion. Lidocaine was used to anesthetize the skin and subcutaneous tissue.  1500 cc's of bloody, serosanginous appearing fluid was drained from the right pleural space. Catheter then removed and bandaid applied to site.  Complications/Tolerance None; patient tolerated the procedure well. Chest X-ray is ordered to confirm no post-procedural complication.  EBL Minimal  Specimen(s) Pleural fluid   Garner Nash, DO [facility] 03/24/2021 4:22 PM

## 2021-04-12 NOTE — Progress Notes (Signed)
Pt placed on cpap for the night. °

## 2021-04-12 NOTE — Progress Notes (Addendum)
eLink Physician-Brief Progress Note Patient Name: Gary [name] DOB: 08/22/1950 MRN: [id number]   Date of Service  04/08/2021  HPI/Events of Note  Pt underwent thoracentesis today complicated with right chest wall emphysema, and moderate right loculated hydropneumothorax ex vacuo. Nursing calls to report hypotension and hypoxia. Patient in now on NRB mask. Repeat CXR reveals similar finding to the post procedure CXR. BP = 87/58 with MAP = 67 and Sat - 99% on NRB mask.  eICU Interventions  Plan: Will request that PCCM ground team evaluate the patient at bedside.      Intervention Category Major Interventions: Other:  Lysle Dingwall 03/25/2021, 9:37 PM

## 2021-04-13 ENCOUNTER — Inpatient Hospital Stay (HOSPITAL_COMMUNITY): Payer: Medicare Other

## 2021-04-13 DIAGNOSIS — J90 Pleural effusion, not elsewhere classified: Secondary | ICD-10-CM | POA: Diagnosis not present

## 2021-04-13 DIAGNOSIS — Z9889 Other specified postprocedural states: Secondary | ICD-10-CM | POA: Diagnosis not present

## 2021-04-13 DIAGNOSIS — C3491 Malignant neoplasm of unspecified part of right bronchus or lung: Secondary | ICD-10-CM | POA: Diagnosis not present

## 2021-04-13 LAB — GLUCOSE, CAPILLARY
Glucose-Capillary: 96 mg/dL (ref 70–99)
Glucose-Capillary: 232 mg/dL — ABNORMAL HIGH (ref 70–99)
Glucose-Capillary: 278 mg/dL — ABNORMAL HIGH (ref 70–99)
Glucose-Capillary: 91 mg/dL (ref 70–99)

## 2021-04-13 MED ORDER — FERROUS SULFATE 325 (65 FE) MG PO TABS
325.0000 mg | ORAL_TABLET | Freq: Every day | ORAL | Status: DC
  Administered 2021-04-13 – 2021-04-18 (×6): 325 mg via ORAL
  Filled 2021-04-13 (×6): qty 1

## 2021-04-13 MED ORDER — APIXABAN 5 MG PO TABS
5.0000 mg | ORAL_TABLET | Freq: Two times a day (BID) | ORAL | Status: DC
  Administered 2021-04-13 – 2021-04-16 (×8): 5 mg via ORAL
  Filled 2021-04-13 (×8): qty 1

## 2021-04-13 MED ORDER — ADULT MULTIVITAMIN W/MINERALS CH
1.0000 | ORAL_TABLET | Freq: Every day | ORAL | Status: DC
  Administered 2021-04-13 – 2021-04-18 (×6): 1 via ORAL
  Filled 2021-04-13 (×6): qty 1

## 2021-04-13 MED ORDER — ENSURE MAX PROTEIN PO LIQD
11.0000 [oz_av] | Freq: Two times a day (BID) | ORAL | Status: DC
Start: 2021-04-13 — End: 2021-04-19
  Administered 2021-04-13 – 2021-04-15 (×3): 11 [oz_av] via ORAL
  Filled 2021-04-13 (×13): qty 330

## 2021-04-13 NOTE — Progress Notes (Signed)
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 Initial thoracentesis of loculated pleural effusion was unsuccessful. He was transferred to ICU at [facility] where 2L blood tinged fluid was drained with right thoracentesis on 11/17. Respiratory distress has continued despite diuretic and antibiotics after thoracentesis.  11/25: Patient unable to lie flat for Pleurx catheter, underwent bedside left thoracentesis 1.5 L, complicated by left hydropneumothorax but no worsening dyspnea or hypoxia.  Palliative on board.  Patient/family still evaluating options, not comfort care at this time.  They are also not interested in residential hospice.  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   - Pt currently requiring 30L at 60% HHFNC, BiPAP overnight. PCCM and palliative continue to follow.  - Cytology from thoracenteses 11/16: Shows malignant cells consistent with non-small cell lung cancer -  Completed vancomycin, zosyn x7 days. - Continue inhaled therapies -Per pulmonology, suspects that we are heading for hospice care, palliative care and agree with that.  - As per discussion with PCCM, not a candidate for chemotherapy at this time.  Felt appropriate for transition to comfort care in hospital. - To assist with symptom burden, PCCM considered pleurx placement on 11/25 but patient unable to lie flat for the procedure and hence underwent bedside left thoracentesis 1.5 L, complicated by left hydropneumothorax but no worsening dyspnea or hypoxia.  Following serial chest x-rays and stable. - Palliative on board.  Patient/family still evaluating options, not comfort care at this time.  They are also not interested in residential hospice. -PCCM seen this morning and they are attempting to wean down high O2 requirement  PAF: Remains in sinus rhythm with BBB morphology. - Continue amiodarone -Eliquis was on hold for Pleurx placement but now that Pleurx cannot be placed, checking with PCCM to see if we can resume the Eliquis.  Hypotension:  - Continue midodrine at high dose. Hold parameters for torsemide, slidenafil placed. - Ongoing intermittent hypotension.  Hx PUD:  - PPI BID  Chronic HFpEF:  - Tx HTN, continue baseline torsemide  Hypokalemia: -Replaced.  Anemia of chronic disease:  - Roughly stable hgb in 8's. No bleeding reported.  Pulmonary HTN:  - Continue sildenafil.   T2DM: HbA1c 6.9%.  - Continue glargine insulin 10u and mod SSI.  Reasonable control.  HLD:  - Continue statin  Anxiety:  - Continue buspar.    DVT prophylaxis: Eliquis currently on hold for Pleurx.  Added SCDs. Code Status: DNR Family Communication: Discussed with patient spouse in detail at bedside.  She works with [employer]. Disposition Plan:  Status is: Inpatient  Remains inpatient appropriate because: Continued respiratory  failure.  Needing left thoracentesis.  Consultants:   PCCM Palliative  Procedures:  Right thoracentesis 11/16, 11/18  Cytopathology: Specimen Submitted:  A. PLEURAL FLUID, RIGHT, THORACENTESIS:    FINAL MICROSCOPIC DIAGNOSIS:  - Malignant cells consistent with non-small cell carcinoma   Antimicrobials: Vancomycin, zosyn   Subjective: No specific symptoms.  Breathing "so-so".  Had a restful night.  Was on CPAP overnight.  Per spouse, PCCM MD has seen this morning and attempting to reduce oxygen.  Objective: Vitals:   04/13/21 0046 04/13/21 0605 04/13/21 0804 04/13/21 0808  BP: 98/64 96/63  98/62  Pulse: 70 72 74 76  Resp: 20 20 20  (!) 24  Temp:  97.9 F (36.6 C)    TempSrc:  Axillary    SpO2:  96%  99%  Weight:      Height:        Intake/Output Summary (Last 24 hours) at 04/13/2021 1106 Last data filed at 04/13/2021 6283 Gross per 24 hour  Intake 120 ml  Output 2475 ml  Net -2355 ml   Filed Weights   04/09/2021 1529 04/04/21 0414 04/04/21 1137  Weight: 68.4 kg 70.2 kg 68.8 kg   Gen: Ill-appearing, tired, very pleasant male, moderately built, poorly nourished sitting up in bed without distress. Pulm: Absent breath sounds lower half of right lung fields-do not hear much difference compared to before thoracentesis.  Rest of lung fields with diminished/harsh breath sounds.  No wheezing, rhonchi or crackles.  Currently without increased work of breathing while laying still in bed. CV: S1 and S2 heard, RRR.  No JVD, murmurs or pedal edema.  Telemetry personally reviewed: SR with BBB morphology. GI: Abdomen soft, non-tender, non-distended, with normoactive bowel sounds.  Ext: Warm, no deformities Skin: No new rashes, lesions or ulcers on visualized skin. Neuro: Alert and oriented.  No focal neurological deficits Psych: Judgement and insight appear marginal. Mood euthymic & affect congruent. Behavior is appropriate.    Data Reviewed: I have personally reviewed following labs and imaging studies  CBC: Recent Labs  Lab  04/07/21 0207 04/09/21 0218  WBC 12.1* 12.1*  HGB 8.1* 8.0*  HCT 27.4* 27.1*  MCV 81.5 82.1  PLT 447* 151*   Basic Metabolic Panel: Recent Labs  Lab 04/07/21 0207 04/09/21 0218 04/11/21 0154  NA 137 136 137  K 4.0 3.3* 3.6  CL 97* 96* 94*  CO2 32 33* 34*  GLUCOSE 160* 138* 121*  BUN 21 20 22   CREATININE 0.99 0.90 0.85  CALCIUM 7.8* 7.9* 8.2*   GFR: Estimated Creatinine Clearance: 73.4 mL/min (by C-G formula based on SCr of 0.85 mg/dL).  CBG: Recent Labs  Lab 04/16/2021 0753 04/05/2021 1137 04/05/2021 1641 03/20/2021 2129 04/13/21 0835  GLUCAP 141* 168* 183* 164* 96   Recent Results (from the past 240 hour(s))  MRSA Next Gen by PCR, Nasal     Status: Abnormal   Collection Time: 03/31/2021  4:08 PM   Specimen: Nasal Mucosa; Nasal Swab  Result Value Ref Range Status   MRSA by PCR Next Gen DETECTED (A) NOT DETECTED Final    Comment: RESULT CALLED TO, READ BACK BY AND VERIFIED WITH: ASHLEY SHELTON @ [id number] ON 04/17/2021 C VARNER (NOTE) The GeneXpert MRSA Assay (FDA approved for NASAL specimens only), is one component of a comprehensive MRSA colonization surveillance program. It is not intended to diagnose MRSA infection nor to guide or monitor treatment for MRSA infections. Test performance is not FDA approved in patients less than 28 years old. Performed at [facility], [address] [place], [place] [address]   Culture, body fluid w Gram Stain-bottle     Status: None   Collection Time: 04/06/2021  4:18 PM   Specimen: Pleura  Result Value Ref Range Status   Specimen Description PLEURAL  Final   Special Requests   Final    BOTTLES DRAWN AEROBIC AND ANAEROBIC Blood Culture adequate volume   Culture   Final    NO GROWTH 5 DAYS Performed at [facility], [address] [place], [place] [address]    Report Status 04/08/2021 FINAL  Final  Gram stain     Status: None   Collection Time: 04/17/2021  4:18 PM   Specimen: Pleura  Result Value Ref Range Status   Specimen  Description PLEURAL  Final   Special Requests NONE  Final   Gram Stain   Final    WBC PRESENT,BOTH PMN AND MONONUCLEAR NO ORGANISMS SEEN CYTOSPIN SMEAR Performed at [facility], [address], [place], [place] [address]    Report Status 03/31/2021 FINAL  Final  Culture, blood (Routine X 2) w Reflex to ID Panel     Status: None   Collection Time: 04/16/2021  7:50 PM   Specimen: BLOOD RIGHT ARM  Result Value Ref Range Status   Specimen Description BLOOD RIGHT ARM  Final   Special Requests   Final    BOTTLES DRAWN AEROBIC AND ANAEROBIC Blood Culture results may not be optimal due to an excessive volume of blood received in culture bottles   Culture   Final    NO GROWTH 5 DAYS Performed at [facility], [address] [place], [place] [address]    Report Status 04/08/2021 FINAL  Final  Culture, blood (Routine X 2) w Reflex to ID Panel     Status: None   Collection Time: 03/26/2021  7:50 PM   Specimen: BLOOD LEFT HAND  Result Value Ref Range Status   Specimen Description BLOOD LEFT HAND  Final   Special Requests   Final    BOTTLES DRAWN AEROBIC AND ANAEROBIC Blood Culture adequate volume   Culture   Final    NO GROWTH 5 DAYS Performed at [facility], [address] [place], [place] [address]    Report Status 04/08/2021 FINAL  Final  Body fluid culture w Gram Stain     Status: None   Collection Time: 04/04/21  1:02 PM   Specimen: Body Fluid  Result Value Ref Range Status   Specimen Description FLUID  Final   Special Requests  PLEURAL  Final   Gram Stain   Final    ABUNDANT WBC PRESENT, PREDOMINANTLY MONONUCLEAR NO ORGANISMS SEEN    Culture   Final    NO GROWTH 3 DAYS Performed at [facility], [address] [place], [place] [address]    Report Status 04/07/2021 FINAL  Final  Fungus Culture With Stain     Status: None (Preliminary result)   Collection Time: 04/04/21  1:03 PM   Specimen: Pleural Fluid  Result Value Ref Range Status   Fungus Stain Final report  Final     Comment: (NOTE) Performed At: [facility] [place], [place] [id number] Rush Farmer MD FB:[id number]    Fungus (Mycology) Culture PENDING  Incomplete   Fungal Source FLUID  Final    Comment: Performed at [facility], [place] [address], [place], [place] [address]  Fungus Culture Result     Status: None   Collection Time:  04/04/21  1:03 PM  Result Value Ref Range Status   Result 1 Comment  Final    Comment: (NOTE) KOH/Calcofluor preparation:  no fungus observed. Performed At: [facility] [place], [place] [id number] Rush Farmer MD [id number]   MRSA Next Gen by PCR, Nasal     Status: Abnormal   Collection Time: 04/04/21  3:52 PM   Specimen: Nasal Mucosa; Nasal Swab  Result Value Ref Range Status   MRSA by PCR Next Gen DETECTED (A) NOT DETECTED Final    Comment: RESULT CALLED TO, READ BACK BY AND VERIFIED WITH: Brandt Loosen RN [phone] 04/04/21 A BROWNING (NOTE) The GeneXpert MRSA Assay (FDA approved for NASAL specimens only), is one component of a comprehensive MRSA colonization surveillance program. It is not intended to diagnose MRSA infection nor to guide or monitor treatment for MRSA infections. Test performance is not FDA approved in patients less than 30 years old. Performed at [facility], [place] [address] [place], [place] [address]       Radiology Studies: DG Chest 1 View  Result Date: 04/08/2021 CLINICAL DATA:  Follow-up pneumothorax EXAM: CHEST  1 VIEW COMPARISON:  03/19/2021, CT 04/09/2021, radiograph 04/10/2021 FINDINGS: Cardiomegaly with worsened vascular congestion and perihilar edema. Bilateral pleural effusions. Consolidation at left lung base. A right-sided pneumothorax is probably stable but there appears to be reaccumulation of pleural fluid or airspace disease at the right base. IMPRESSION: 1. Right-sided hydropneumothorax; the pneumothorax is probably stable in size but suspect that there is  reaccumulation of pleural fluid as the inferior edge is less well-defined compared to the radiograph earlier today. 2. Cardiomegaly with vascular congestion, worsened edema and continued airspace disease at both bases. Probable small left effusion Electronically Signed   By: Donavan Foil M.D.   On: 03/29/2021 21:32   DG CHEST PORT 1 VIEW  Result Date: 04/13/2021 CLINICAL DATA:  Shortness of breath.  Right hydropneumothorax. EXAM: PORTABLE CHEST 1 VIEW COMPARISON:  03/24/2021 FINDINGS: A small to moderate right hydropneumothorax is unchanged since prior study. Right lower lung consolidation is unchanged. Decreased airspace opacity is seen in the left lower lobe compared to prior exam. Cardiomegaly remains stable. IMPRESSION: Stable small to moderate right hydropneumothorax. Stable right lower lung consolidation. Decreased left lower lobe airspace disease. Electronically Signed   By: Marlaine Hind M.D.   On: 04/13/2021 08:45   DG CHEST PORT 1 VIEW  Result Date: 04/09/2021 CLINICAL DATA:  Status post thoracentesis. EXAM: PORTABLE CHEST 1 VIEW COMPARISON:  CT of the chest 03/20/2021.  Chest x-ray 04/07/2021. FINDINGS: There is a moderate right-sided hydropneumothorax which appears loculated in the lower right hemithorax. The pneumothorax portion is new from prior. Density in the right lower lung is unchanged from prior study. There is no mediastinal shift. Cardiomegaly and central pulmonary vascular congestion persists. Left basilar atelectasis is unchanged. No acute fractures. New mild right chest wall emphysema. IMPRESSION: 1. Moderate right hydropneumothorax which appears loculated in the lower right hemithorax. 2. New right chest wall emphysema. 3. Stable cardiomegaly with central pulmonary vascular congestion. 4. These results were called by telephone at the time of interpretation on 04/14/2021 at 5:09 pm to provider nurse Helene Kelp, who verbally acknowledged these results. Electronically Signed   By: Ronney Asters M.D.   On: 04/07/2021 17:11    Scheduled Meds:  amiodarone  200 mg Oral Daily   atorvastatin  40 mg Oral QPM   busPIRone  7.5 mg Oral BID   chlorhexidine  15  mL Mouth Rinse BID   fluticasone furoate-vilanterol  1 puff Inhalation Daily   insulin aspart  0-15 Units Subcutaneous TID WC   insulin glargine-yfgn  10 Units Subcutaneous Daily   ipratropium  2 spray Each Nare BID   magnesium oxide  400 mg Oral Daily   mouth rinse  15 mL Mouth Rinse q12n4p   midodrine  10 mg Oral TID WC   pantoprazole  40 mg Oral BID   polyethylene glycol  17 g Oral Daily   sildenafil  20 mg Oral TID   torsemide  20 mg Oral BID   umeclidinium bromide  1 puff Inhalation Daily   Continuous Infusions:  sodium chloride Stopped (04/06/21 1445)     LOS: 10 days   Time spent: 35 minutes.  Vernell Leep, MD,  FACP, [facility], [facility], [facility] (Care Management Physician Certified) [facility]  To contact the attending provider between 7A-7P or the covering provider during after hours 7P-7A, please log into the web site [email or web address] and access using universal [place] password for that web site. If you do not have the password, please call the hospital operator.

## 2021-04-13 NOTE — Progress Notes (Signed)
PT Cancellation Note  Patient Details Name: Gary [name] MRN: [id number] DOB: [birth year]   Cancelled Treatment:    Reason Eval/Treat Not Completed: Patient declined, no reason specified. RN reporting pt is on HFNC and desatting with all mobility. Pt requesting to hold off on PT today and possibly until Monday. Will plan to check on pt tomorrow if time permits.   Moishe Spice, PT, DPT Acute Rehabilitation Services  Pager: [phone] Office: [place] 04/13/2021, 12:10 PM

## 2021-04-13 NOTE — Progress Notes (Signed)
NAME:  Gary [name], MRN:  [id number], DOB:  30-Jan-1951, LOS: 28 ADMISSION DATE:  04/17/2021, CONSULTATION DATE:  03/24/2021 REFERRING MD:  Dr. Karle Starch, ER, CHIEF COMPLAINT:  Short of breath   History of Present Illness:  70 yo male former smoker presented to [facility] ER with dyspnea and cough.  He has COPD and uses 6 liters oxygen.  He was to have thoracentesis for right pleural effusion by Dr. Valeta Harms on 04/04/21.  He had eliquis held in anticipation of this.  First noted to have pleural effusions in April 2022.  He was started on sildenafil for pulmonary hypertension during hospitalization in May 2022.  He had right thoracentesis done in May and July of 2022.  In ER he was started on supplemental oxygen and Bipap.  He was found to have anemia with hemoglobin 7.  He had EGD in April 2022 that showed large duodenal ulcer, short segments of Barrett's esophagus, and erosive gastritis.  He reports his symptoms started on 03/30/21.  He isn't aware of having blood in his stool or melena.  He denies chest pain or fever.  Pertinent  Medical History  A fib, DM type 2, HLD, HTN, Pulmonary hypertension, Barrett's esophagus, CAD, PUD with upper GI bleeding, COPD, +ANA, Pneumococcal pneumonia April 2022  Studies:  Rt thoracentesis 09/18/20 >> 1200 ml fluid, glucose 220, protein < 3, LDH 139, 790 cells (92% L), cytology negative Rt thoracentesis 12/04/20 >> glucose 174, protein 4, LDH 124, 810 cells (93% L), cytology negative Echo 12/04/20 >> EF 55 to 60%, mod LVH, severe RV systolic dysfx, mod RV enlargement, RVSP 59.5 mmHg, mod LA and RA dilation CT chest 12/31/20 >> atherosclerosis, 2.4 cm subcarinal LN, 2.3 cm Rt hilar LN, 2 cm precarinal LN, multiloculated Rt pleural effusion  Interim History / Subjective:   Had desaturation overnight. Now stable on 60% and 20L   Objective   Blood pressure 98/62, pulse 76, temperature 97.9 F (36.6 C), temperature source Axillary, resp. rate (!) 24, height 5' 7"   (1.702 m), weight 68.8 kg, SpO2 99 %.    FiO2 (%):  [70 %-80 %] 70 %   Intake/Output Summary (Last 24 hours) at 04/13/2021 0929 Last data filed at 04/13/2021 5361 Gross per 24 hour  Intake 120 ml  Output 2475 ml  Net -2355 ml   Filed Weights   04/09/2021 1529 04/04/21 0414 04/04/21 1137  Weight: 68.4 kg 70.2 kg 68.8 kg    Examination: Gen: elderly male, critically ill CV: RRR, s1 s2  Resp: crackles, dminished bilaterally  Ext: no edema  Abd: soft nt nd   Resolved Hospital Problem list     Assessment & Plan:  Gary [name] is a 70 y.o. 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 - continue current meds  Anemia with hx of duodenal ulcer. -Follow CBC Atrial fibrillation. -eliquis on hold    Labs   CBC: Recent Labs  Lab 04/07/21 0207 04/09/21 0218  WBC 12.1* 12.1*  HGB 8.1* 8.0*  HCT 27.4* 27.1*  MCV 81.5 82.1  PLT 447* 469*    Basic Metabolic Panel: Recent Labs  Lab 04/07/21 0207 04/09/21 0218 04/11/21 0154  NA 137 136 137  K 4.0 3.3* 3.6  CL 97* 96* 94*  CO2 32 33* 34*  GLUCOSE 160* 138* 121*  BUN 21 20 22   CREATININE 0.99 0.90 0.85  CALCIUM 7.8* 7.9* 8.2*   GFR: Estimated Creatinine Clearance: 73.4 mL/min (by C-G formula based on SCr of 0.85 mg/dL). Recent Labs  Lab 04/07/21 0207 04/09/21 0218  WBC 12.1* 12.1*    Liver Function Tests: No results for input(s): AST, ALT, ALKPHOS, BILITOT, PROT, ALBUMIN in the last 168 hours. No results for input(s): LIPASE,  AMYLASE in the last 168 hours. No results for input(s): AMMONIA in the last 168 hours.  ABG    Component Value Date/Time   PHART 7.484 (H) 03/19/2021 1003   PCO2ART 44.0 04/14/2021 1003   PO2ART 59.0 (L) 04/09/2021 1003   HCO3 32.4 (H) 03/19/2021 1003   TCO2 33 (H) 09/24/2020 1516   ACIDBASEDEF 4.0 (H) 09/07/2020 2236   O2SAT 89.3 04/15/2021 1003     Coagulation Profile: No results for input(s): INR, PROTIME in the last 168 hours.   Cardiac Enzymes: No results for input(s): CKTOTAL, CKMB, CKMBINDEX, TROPONINI in the last 168 hours.  HbA1C: Hgb A1c MFr Bld  Date/Time Value Ref Range Status  03/31/2021 09:58 AM 6.9 (H) 4.8 - 5.6 % Final    Comment:    (NOTE) Pre diabetes:          5.7%-6.4%  Diabetes:              >6.4%  Glycemic control for   <7.0% adults with diabetes   01/11/2021 02:54 AM 7.4 (H) 4.8 - 5.6 % Final    Comment:    (NOTE) Pre diabetes:          5.7%-6.4%  Diabetes:              >6.4%  Glycemic control for   <7.0% adults with diabetes     CBG: Recent Labs  Lab 03/22/2021 0753 03/25/2021 1137 04/02/2021 1641 04/15/2021 2129 04/13/21 0835  GLUCAP 141* 168* 183* 164* 96     Garner Nash, DO [facility] 04/13/2021 9:29 AM

## 2021-04-13 NOTE — Progress Notes (Signed)
Pt O2 was in the 80's on HFNC 30 LPM, FiO2 70 %, BP 90/55 and RR 22. Pt is alert and oriented. Dr. Sidney Ace and PCCM on call was notified. Stat CXR ordered and pt placed on a NRB with sat 95-98%. Will continue to monitor pt.

## 2021-04-14 DIAGNOSIS — C3491 Malignant neoplasm of unspecified part of right bronchus or lung: Secondary | ICD-10-CM | POA: Diagnosis not present

## 2021-04-14 DIAGNOSIS — Z9889 Other specified postprocedural states: Secondary | ICD-10-CM | POA: Diagnosis not present

## 2021-04-14 DIAGNOSIS — J90 Pleural effusion, not elsewhere classified: Secondary | ICD-10-CM | POA: Diagnosis not present

## 2021-04-14 LAB — GLUCOSE, CAPILLARY
Glucose-Capillary: 96 mg/dL (ref 70–99)
Glucose-Capillary: 119 mg/dL — ABNORMAL HIGH (ref 70–99)
Glucose-Capillary: 166 mg/dL — ABNORMAL HIGH (ref 70–99)
Glucose-Capillary: 265 mg/dL — ABNORMAL HIGH (ref 70–99)
Glucose-Capillary: 159 mg/dL — ABNORMAL HIGH (ref 70–99)

## 2021-04-14 LAB — BASIC METABOLIC PANEL
Anion gap: 7 (ref 5–15)
BUN: 19 mg/dL (ref 8–23)
CO2: 40 mmol/L — ABNORMAL HIGH (ref 22–32)
Calcium: 7.8 mg/dL — ABNORMAL LOW (ref 8.9–10.3)
Chloride: 90 mmol/L — ABNORMAL LOW (ref 98–111)
Creatinine, Ser: 0.84 mg/dL (ref 0.61–1.24)
GFR, Estimated: >60 mL/min (ref >60)
Glucose, Bld: 99 mg/dL (ref 70–99)
Potassium: 3.3 mmol/L — ABNORMAL LOW (ref 3.5–5.1)
Sodium: 137 mmol/L (ref 135–145)

## 2021-04-14 LAB — CBC
HCT: 28.2 % — ABNORMAL LOW (ref 39.0–52.0)
Hemoglobin: 8.6 g/dL — ABNORMAL LOW (ref 13.0–17.0)
MCH: 24.6 pg — ABNORMAL LOW (ref 26.0–34.0)
MCHC: 30.5 g/dL (ref 30.0–36.0)
MCV: 80.6 fL (ref 80.0–100.0)
Platelets: 462 10*3/uL — ABNORMAL HIGH (ref 150–400)
RBC: 3.5 MIL/uL — ABNORMAL LOW (ref 4.22–5.81)
RDW: 19.9 % — ABNORMAL HIGH (ref 11.5–15.5)
WBC: 8.7 10*3/uL (ref 4.0–10.5)
nRBC: 0 % (ref 0.0–0.2)

## 2021-04-14 LAB — MAGNESIUM: Magnesium: 1.5 mg/dL — ABNORMAL LOW (ref 1.7–2.4)

## 2021-04-14 MED ORDER — POTASSIUM CHLORIDE CRYS ER 20 MEQ PO TBCR
40.0000 meq | EXTENDED_RELEASE_TABLET | Freq: Once | ORAL | Status: AC
  Administered 2021-04-14: 07:00:00 40 meq via ORAL
  Filled 2021-04-14: qty 2

## 2021-04-14 MED ORDER — MAGNESIUM SULFATE 4 GM/100ML IV SOLN
4.0000 g | Freq: Once | INTRAVENOUS | Status: AC
  Administered 2021-04-14: 17:00:00 4 g via INTRAVENOUS
  Filled 2021-04-14: qty 100

## 2021-04-14 MED ORDER — ACETAZOLAMIDE 250 MG PO TABS
250.0000 mg | ORAL_TABLET | Freq: Two times a day (BID) | ORAL | Status: AC
  Administered 2021-04-14 – 2021-04-15 (×2): 250 mg via ORAL
  Filled 2021-04-14 (×2): qty 1

## 2021-04-14 MED ORDER — POTASSIUM CHLORIDE CRYS ER 20 MEQ PO TBCR
40.0000 meq | EXTENDED_RELEASE_TABLET | Freq: Once | ORAL | Status: AC
  Administered 2021-04-14: 16:00:00 40 meq via ORAL
  Filled 2021-04-14: qty 2

## 2021-04-14 NOTE — Progress Notes (Signed)
Physical Therapy Treatment Patient Details Name: Gary [name] MRN: [id number] DOB: [birth year] Today's Date: 04/14/2021   History of Present Illness Patient is a 70 y/o male who was admitted 11/16 with SOB. Found to have acute on chronic hypoxemic respiratory failure and Right middle lobe mass with lymphadenopathy and recurrent right pleural effusion. Questionable placement of Pleurx catheter vs comfort care.  PMH includes DM, CHF, lung mass, COPD on home 02, A-fib, pulmonary HTN, CAD.    PT Comments    Patient wanting to get OOB to eat his lunch. Able to assist with min assist, however on 14L HFNC pt's sats dropped to 73% and recovered to 88% by 3 minutes.  Briefly discussed discharge plans and pt is determined to go home with family. Equipment needs updated.    Recommendations for follow up therapy are one component of a multi-disciplinary discharge planning process, led by the attending physician.  Recommendations may be updated based on patient status, additional functional criteria and insurance authorization.  Follow Up Recommendations  Home health PT     Assistance Recommended at Discharge Frequent or constant Supervision/Assistance  Equipment Recommendations  Wheelchair (measurements PT);Wheelchair cushion (measurements PT);Hospital bed    Recommendations for Other Services       Precautions / Restrictions Precautions Precautions: Other (comment);Fall Precaution Comments: watch 02 and BP soft, HFNC     Mobility  Bed Mobility Overal bed mobility: Needs Assistance Bed Mobility: Supine to Sit     Supine to sit: HOB elevated;Min assist     General bed mobility comments: reaching for assist to raise torso despite HOB elevated    Transfers Overall transfer level: Needs assistance Equipment used: 1 person hand held assist Transfers: Sit to/from Stand;Bed to chair/wheelchair/BSC Sit to Stand: Min assist     Step pivot transfers: Min assist     General  transfer comment: Min assist for sit to stand and to manage lines. Able to take steps to get to chair, assist with managing lines/02 tubing. 2/4 DOE. Sp02 84% on 14L during transfer; after seated dropped to 73% and recovered to 88% in 3 minutes.    Ambulation/Gait             Pre-gait activities: pivotal steps only due to decr sats, weakness     Stairs             Wheelchair Mobility    Modified Rankin (Stroke Patients Only)       Balance Overall balance assessment: Needs assistance Sitting-balance support: Feet supported;No upper extremity supported Sitting balance-Leahy Scale: Good     Standing balance support: During functional activity Standing balance-Leahy Scale: Poor Standing balance comment: Min guard-Min A for standing balance, UE support needed for dynamic tasks                            Cognition Arousal/Alertness: Awake/alert Behavior During Therapy: WFL for tasks assessed/performed Overall Cognitive Status: Within Functional Limits for tasks assessed                                          Exercises Other Exercises Other Exercises: Lunch arrived and pt wanting to eat while it was warm. Educated on doing ankle pumps (pt reports he does), SLR (he tries with blankets and SCDs making it difficult) and bridging (he has not tried but states he will when  back in bed).    General Comments        Pertinent Vitals/Pain Pain Assessment: No/denies pain    Home Living                          Prior Function            PT Goals (current goals can now be found in the care plan section) Acute Rehab PT Goals Patient Stated Goal: to get out of bed Time For Goal Achievement: 04/23/21 Potential to Achieve Goals: Good Progress towards PT goals: Not progressing toward goals - comment (limited by drop in sats)    Frequency    Min 3X/week      PT Plan Current plan remains appropriate;Equipment  recommendations need to be updated    Co-evaluation              AM-PAC PT "6 Clicks" Mobility   Outcome Measure  Help needed turning from your back to your side while in a flat bed without using bedrails?: A Little Help needed moving from lying on your back to sitting on the side of a flat bed without using bedrails?: A Little Help needed moving to and from a bed to a chair (including a wheelchair)?: A Little Help needed standing up from a chair using your arms (e.g., wheelchair or bedside chair)?: A Little Help needed to walk in hospital room?: A Little Help needed climbing 3-5 steps with a railing? : Total 6 Click Score: 16    End of Session Equipment Utilized During Treatment: Oxygen (14L HFNC) Activity Tolerance: Treatment limited secondary to medical complications (Comment) (drop in sats with minimal activity) Patient left: in chair;with call bell/phone within reach;with family/visitor present Nurse Communication: Mobility status;Other (comment) (sats dropping) PT Visit Diagnosis: Muscle weakness (generalized) (M62.81);Unsteadiness on feet (R26.81);Difficulty in walking, not elsewhere classified (R26.2)     Time: 8786-7672 PT Time Calculation (min) (ACUTE ONLY): 11 min  Charges:  $Therapeutic Activity: 8-22 mins                      Arby Barrette, PT Acute Rehabilitation Services  Pager [phone] Office [phone]    Rexanne Mano 04/14/2021, 11:52 AM

## 2021-04-14 NOTE — Progress Notes (Signed)
Pt weaned off of [place] to HFNC ([place]) at 14Lpm. Tolerating well when breathing through nose, with SATs of 92-95%. Pt does tend to breath through his mouth at times causing SATs to drop to 85% quickly with up to a 40mn recovery. No distress, WOB WNL. HR and RR stable. RT will continue to monitor.

## 2021-04-14 NOTE — Progress Notes (Addendum)
Placed patient on CPAP 16.0 cm H20 per home settings.. 40% FIO2

## 2021-04-14 NOTE — Progress Notes (Signed)
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 Initial thoracentesis of loculated pleural effusion was unsuccessful. He was transferred to ICU at [facility] where 2L blood tinged fluid was drained with right thoracentesis on 11/17. Respiratory distress continued despite diuretic and antibiotics after thoracentesis. 11/25: Patient unable to lie flat for Pleurx catheter, underwent bedside left thoracentesis 1.5 L, complicated by left loculated hydropneumothorax but no worsening dyspnea or hypoxia.  Palliative on board.  Patient/family still evaluating options, not comfort care at this time.  They are also not interested in residential hospice.  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   - Pt was on 30L at 60% HHFNC, BiPAP overnight.  Discussed in detail with RT.  Weaned off of HHFNC to HFNC (salter) at 14 L/min, seems to be saturating well when breathing through the nose but desaturates  quickly into the 80s when mouth breathing or with minimal activity with PT.   - PCCM and palliative continue to follow.  - Cytology from thoracenteses 11/16: Shows malignant cells consistent with non-small cell lung cancer - Completed vancomycin, zosyn x7 days. - Continue inhaled therapies - As per discussion with PCCM, not a candidate for chemotherapy at this time.  Felt appropriate for transition to comfort care in hospital.  Patient/spouse not yet ready for this. - To assist with symptom burden, PCCM considered pleurx placement on 11/25 but patient unable to lie flat for the procedure and hence underwent bedside left thoracentesis 1.5 L, complicated by left loculated hydropneumothorax but no worsening dyspnea or hypoxia.  Following serial chest x-rays and stable. - Palliative on board.  Patient/family still evaluating options, not comfort care at this time.  They are also not interested in residential hospice.  PAF: Remains in sinus rhythm with BBB morphology. - Continue amiodarone -Eliquis was on hold for Pleurx placement but has been resumed.  Hypotension:  - Continue midodrine at high dose. Hold parameters for torsemide, slidenafil placed. - Ongoing intermittent hypotension.  Hx PUD:  - PPI BID  Chronic HFpEF:  - Tx HTN, continue baseline torsemide.  Metabolic alkalosis, will give Diamox 250 mg x 2 doses.  Hypokalemia/hypomagnesemia: -Replaced.  Replace magnesium and follow.  Anemia of chronic disease:  - Roughly stable hgb in 8's. No bleeding reported.  Pulmonary HTN:  - Continue sildenafil.   T2DM: HbA1c 6.9%.  - Continue glargine insulin 10u and mod SSI.  Reasonable control.  HLD:  - Continue statin  Anxiety:  - Continue buspar.    DVT prophylaxis: Eliquis currently on hold for Pleurx.  Added SCDs. Code Status: DNR Family Communication: None at bedside  this morning. Disposition Plan:  Status is: Inpatient  Remains inpatient appropriate because: Continued  respiratory failure.  Needing left thoracentesis.  Consultants:  PCCM Palliative  Procedures:  Right thoracentesis 11/16, 11/18, 11/25  Cytopathology: Specimen Submitted:  A. PLEURAL FLUID, RIGHT, THORACENTESIS:    FINAL MICROSCOPIC DIAGNOSIS:  - Malignant cells consistent with non-small cell carcinoma   Antimicrobials: Vancomycin, zosyn   Subjective: Patient was still on BiPAP when I saw him this morning.  Wanting to come off BiPAP.  Indicated no new complaints or change.  Objective: Vitals:   04/14/21 0821 04/14/21 0824 04/14/21 0850 04/14/21 1153  BP: 98/64   103/65  Pulse: 76  75 77  Resp: (!) 22  (!) 22 (!) 22  Temp: 98.3 F (36.8 C)   98.6 F (37 C)  TempSrc: Oral   Oral  SpO2: 100% 91% 94% 90%  Weight:      Height:        Intake/Output Summary (Last 24 hours) at 04/14/2021 1549 Last data filed at 04/14/2021 1500 Gross per 24 hour  Intake 240 ml  Output 2650 ml  Net -2410 ml   Filed Weights   04/17/2021 1529 04/04/21 0414 04/04/21 1137  Weight: 68.4 kg 70.2 kg 68.8 kg   Gen: Ill-appearing, tired, very pleasant male, moderately built, poorly nourished sitting up in bed without distress.  BiPAP on. Pulm: Absent breath sounds lower half of right lung fields-do not hear much difference compared to before thoracentesis.  Rest of lung fields with diminished/harsh breath sounds.  No wheezing, rhonchi or crackles.  Currently without increased work of breathing while laying still in bed.  Not much change over the last 2 days. CV: S1 and S2 heard, RRR.  No JVD, murmurs or pedal edema.  Telemetry personally reviewed: Sinus rhythm with BBB morphology. GI: Abdomen soft, non-tender, non-distended, with normoactive bowel sounds.  Noted large subumbilical uncomplicated ventral hernia. Ext: Warm, no deformities Skin: No new rashes, lesions or ulcers on visualized skin. Neuro: Alert and oriented.  No focal neurological deficits Psych: Judgement and insight appear marginal.  Mood euthymic & affect congruent. Behavior is appropriate.    Data Reviewed: I have personally reviewed following labs and imaging studies  CBC: Recent Labs  Lab 04/09/21 0218 04/14/21 0226  WBC 12.1* 8.7  HGB 8.0* 8.6*  HCT 27.1* 28.2*  MCV 82.1 80.6  PLT 469* 263*   Basic Metabolic Panel: Recent Labs  Lab 04/09/21 0218 04/11/21 0154 04/14/21 0226  NA 136 137 137  K 3.3* 3.6 3.3*  CL 96* 94* 90*  CO2 33* 34* 40*  GLUCOSE 138* 121* 99  BUN 20 22 19   CREATININE 0.90 0.85 0.84  CALCIUM 7.9* 8.2* 7.8*  MG  --   --  1.5*   GFR: Estimated Creatinine Clearance: 74.3 mL/min (by C-G formula based on SCr of 0.84 mg/dL).  CBG: Recent Labs  Lab 04/13/21 1615 04/13/21 2105 04/14/21 0610 04/14/21 0819 04/14/21 1124  GLUCAP 278* 91 96 119* 166*   Recent Results (from the past 240 hour(s))  MRSA Next Gen by PCR, Nasal     Status: Abnormal   Collection Time: 04/04/21  3:52 PM   Specimen: Nasal Mucosa; Nasal Swab  Result Value Ref Range Status   MRSA by PCR Next Gen DETECTED (A) NOT DETECTED Final    Comment: RESULT CALLED TO, READ BACK BY AND VERIFIED WITH: Brandt Loosen RN [id number] 04/04/21 A BROWNING (NOTE) The GeneXpert MRSA Assay (FDA approved for NASAL specimens only), is  one component of a comprehensive MRSA colonization surveillance program. It is not intended to diagnose MRSA infection nor to guide or monitor treatment for MRSA infections. Test performance is not FDA approved in patients less than 63 years old. Performed at [facility], [place] [address], [place], [place] [address]       Radiology Studies: DG Chest 1 View  Result Date: 03/26/2021 CLINICAL DATA:  Follow-up pneumothorax EXAM: CHEST  1 VIEW COMPARISON:  03/21/2021, CT 04/11/2021, radiograph 04/10/2021 FINDINGS: Cardiomegaly with worsened vascular congestion and perihilar edema. Bilateral pleural effusions. Consolidation at left lung base. A right-sided pneumothorax is probably stable but there  appears to be reaccumulation of pleural fluid or airspace disease at the right base. IMPRESSION: 1. Right-sided hydropneumothorax; the pneumothorax is probably stable in size but suspect that there is reaccumulation of pleural fluid as the inferior edge is less well-defined compared to the radiograph earlier today. 2. Cardiomegaly with vascular congestion, worsened edema and continued airspace disease at both bases. Probable small left effusion Electronically Signed   By: Donavan Foil M.D.   On: 04/02/2021 21:32   DG CHEST PORT 1 VIEW  Result Date: 04/13/2021 CLINICAL DATA:  Shortness of breath.  Right hydropneumothorax. EXAM: PORTABLE CHEST 1 VIEW COMPARISON:  03/22/2021 FINDINGS: A small to moderate right hydropneumothorax is unchanged since prior study. Right lower lung consolidation is unchanged. Decreased airspace opacity is seen in the left lower lobe compared to prior exam. Cardiomegaly remains stable. IMPRESSION: Stable small to moderate right hydropneumothorax. Stable right lower lung consolidation. Decreased left lower lobe airspace disease. Electronically Signed   By: Marlaine Hind M.D.   On: 04/13/2021 08:45   DG CHEST PORT 1 VIEW  Result Date: 03/19/2021 CLINICAL DATA:  Status post thoracentesis. EXAM: PORTABLE CHEST 1 VIEW COMPARISON:  CT of the chest 03/31/2021.  Chest x-ray 04/07/2021. FINDINGS: There is a moderate right-sided hydropneumothorax which appears loculated in the lower right hemithorax. The pneumothorax portion is new from prior. Density in the right lower lung is unchanged from prior study. There is no mediastinal shift. Cardiomegaly and central pulmonary vascular congestion persists. Left basilar atelectasis is unchanged. No acute fractures. New mild right chest wall emphysema. IMPRESSION: 1. Moderate right hydropneumothorax which appears loculated in the lower right hemithorax. 2. New right chest wall emphysema. 3. Stable cardiomegaly with central pulmonary vascular  congestion. 4. These results were called by telephone at the time of interpretation on 04/13/2021 at 5:09 pm to provider nurse Helene Kelp, who verbally acknowledged these results. Electronically Signed   By: Ronney Asters M.D.   On: 04/13/2021 17:11    Scheduled Meds:  amiodarone  200 mg Oral Daily   apixaban  5 mg Oral BID   atorvastatin  40 mg Oral QPM   busPIRone  7.5 mg Oral BID   chlorhexidine  15 mL Mouth Rinse BID   ferrous sulfate  325 mg Oral Daily   fluticasone furoate-vilanterol  1 puff Inhalation Daily   insulin aspart  0-15 Units Subcutaneous TID WC   insulin glargine-yfgn  10 Units Subcutaneous Daily   ipratropium  2 spray Each Nare BID   magnesium oxide  400 mg Oral Daily   mouth rinse  15 mL Mouth Rinse q12n4p   midodrine  10 mg Oral TID WC   multivitamin with minerals  1 tablet Oral Daily   pantoprazole  40 mg Oral BID   polyethylene glycol  17 g Oral Daily   Ensure Max Protein  11 oz Oral  BID   sildenafil  20 mg Oral TID   torsemide  20 mg Oral BID   umeclidinium bromide  1 puff Inhalation Daily   Continuous Infusions:  sodium chloride Stopped (04/06/21 1445)     LOS: 11 days   Time spent: 35 minutes.  Vernell Leep, MD,  FACP, [facility], [facility], [facility] (Care Management Physician Certified) [facility] [place]  To contact the attending provider between 7A-7P or the covering provider during after hours 7P-7A, please log into the web site [email or web address] and access using universal  password for that web site. If you do not have the password, please call the hospital operator.

## 2021-04-15 ENCOUNTER — Encounter: Payer: Self-pay | Admitting: Pulmonary Disease

## 2021-04-15 DIAGNOSIS — I482 Chronic atrial fibrillation, unspecified: Secondary | ICD-10-CM | POA: Diagnosis not present

## 2021-04-15 DIAGNOSIS — J90 Pleural effusion, not elsewhere classified: Secondary | ICD-10-CM | POA: Diagnosis not present

## 2021-04-15 DIAGNOSIS — J910 Malignant pleural effusion: Secondary | ICD-10-CM | POA: Diagnosis not present

## 2021-04-15 DIAGNOSIS — J449 Chronic obstructive pulmonary disease, unspecified: Secondary | ICD-10-CM | POA: Diagnosis not present

## 2021-04-15 DIAGNOSIS — I5033 Acute on chronic diastolic (congestive) heart failure: Secondary | ICD-10-CM | POA: Diagnosis not present

## 2021-04-15 DIAGNOSIS — J9621 Acute and chronic respiratory failure with hypoxia: Secondary | ICD-10-CM | POA: Diagnosis not present

## 2021-04-15 LAB — MAGNESIUM: Magnesium: 2.3 mg/dL (ref 1.7–2.4)

## 2021-04-15 LAB — GLUCOSE, CAPILLARY
Glucose-Capillary: 126 mg/dL — ABNORMAL HIGH (ref 70–99)
Glucose-Capillary: 149 mg/dL — ABNORMAL HIGH (ref 70–99)
Glucose-Capillary: 231 mg/dL — ABNORMAL HIGH (ref 70–99)
Glucose-Capillary: 273 mg/dL — ABNORMAL HIGH (ref 70–99)

## 2021-04-15 LAB — BASIC METABOLIC PANEL
Anion gap: 6 (ref 5–15)
BUN: 23 mg/dL (ref 8–23)
CO2: 40 mmol/L — ABNORMAL HIGH (ref 22–32)
Calcium: 8 mg/dL — ABNORMAL LOW (ref 8.9–10.3)
Chloride: 88 mmol/L — ABNORMAL LOW (ref 98–111)
Creatinine, Ser: 0.89 mg/dL (ref 0.61–1.24)
GFR, Estimated: >60 mL/min (ref >60)
Glucose, Bld: 167 mg/dL — ABNORMAL HIGH (ref 70–99)
Potassium: 4 mmol/L (ref 3.5–5.1)
Sodium: 134 mmol/L — ABNORMAL LOW (ref 135–145)

## 2021-04-15 NOTE — Progress Notes (Signed)
Daily Progress Note   Patient Name: Gary [name]       Date: 04/15/2021 DOB: March 02, 1951  Age: 70 y.o. MRN#: [id number] Attending Physician: Patrecia Pour, MD Primary Care Physician: Riley Lam Admit Date: 03/30/2021  Reason for Consultation/Follow-up: Establishing goals of care  Patient Profile/HPI:   70 y.o. male  with past medical history of a. Fib, pulmonary hypertension, CHF- cor pulmonale (R ventricular failure), DM2, hypothyroidism, anxiety with associated panic attacks, HLD, HTN, GERD, R lung mass with associated lymphadenopathy (have not been able to biopsy), recurrent pleural effusions s/p several thoracentises, chronic respiratory failure on home oxygen,  admitted on 04/01/2021 with worsening shortness of breath. Palliative medicine consulted for goals of care discussion in light of significant lung disease that continues to worsen.  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   Subjective: Gary [name] is sitting up - awake and alert. He does not have any complaints except that he wants to go home.  Noted he is down to 14L.  I called Gary [name]. She has clear understanding of Gary [name]'s situation, and notes that Gary [name] does too.  We discussed that hospice can support 14L at home with two concentrators. (Unfortunately, this is also very draining on the electric bill and can be considerably costly) She recalled a conversation she had with Dr. Valeta Harms re: can continue thoracentesis as long as it is helping. Also his telling her that something else will eventually happen and Gary [name] will start to decline.  She is hopeful that Gary [name]'s oxygen requirements will be able to decrease even further. She is wanting Gary [name] to survive at least until Christmas. I  discussed my concerns with Gary [name] that if the goal is to get Gary [name] home to be with his dogs- then I wouldn't want to miss the window where he was able to get home before the "other thing" happens.   She inquired about possible timeline- I shared my concern that Gary [name] may have weeks - months. However, if his heart or another major organ function decompensates, at that point prognosis would change significantly.    Physical Exam Vitals and nursing note reviewed.  Pulmonary:     Comments: Increased effort and rate Neurological:     Mental Status: He is alert and oriented to person, place, and time.  Vital Signs: BP 96/60 (BP Location: Left Arm)   Pulse 63   Temp (!) 97.1 F (36.2 C) (Axillary)   Resp 19   Ht 5' 7"  (1.702 m)   Wt 68.8 kg   SpO2 100%   BMI 23.76 kg/m  SpO2: SpO2: 100 % O2 Device: O2 Device: Nasal Cannula O2 Flow Rate: O2 Flow Rate (L/min): 12 L/min  Intake/output summary:  Intake/Output Summary (Last 24 hours) at 04/15/2021 1217 Last data filed at 04/15/2021 1212 Gross per 24 hour  Intake 339.97 ml  Output 1910 ml  Net -1570.03 ml    LBM: Last BM Date: 04/13/21 Baseline Weight: Weight: 76.9 kg Most recent weight: Weight: 68.8 kg       Palliative Assessment/Data: PPS: 40%      Palliative Care Assessment & Plan    Assessment/Recommendations/Plan  Continue current full scope Hospice is able to support patient's at home with 15L of oxygen or less- however, this can be cost prohibitive due to needing 2 concentrators which raises electric bill significantly Very low dosed Lorazepam and morphine are in place for symptom mgmt- however, pt has not utilized- low dose morphine can be very helpful in increasing activity tolerance if given as premed before physical activity- may also help to decrease oxygen requirements PMT will continue to follow and discuss goals of care  Code Status: DNR  Prognosis:  Unable to determine  Discharge  Planning: To Be Determined  Care plan was discussed with patient's spouse, Gary [name]   Thank you for allowing the Palliative Medicine Team to assist in the care of this patient.  Total time:  35  mins     Greater than 50%  of this time was spent counseling and coordinating care related to the above assessment and plan.  Gary [name], AGNP-C Palliative Medicine   Please contact Palliative Medicine Team phone at [phone] for questions and concerns.

## 2021-04-15 NOTE — Progress Notes (Signed)
NAME:  Gary [name], MRN:  [id number], DOB:  28-Nov-1950, LOS: 69 ADMISSION DATE:  04/05/2021, CONSULTATION DATE:  03/29/2021 REFERRING MD:  Dr. Karle Starch, ER, CHIEF COMPLAINT:  Short of breath   History of Present Illness:  70 yo male former smoker presented to [facility] ER with dyspnea and cough.  He has COPD and uses 6 liters oxygen.  He was to have thoracentesis for right pleural effusion by Dr. Valeta Harms on 04/04/21.  He had eliquis held in anticipation of this.  First noted to have pleural effusions in April 2022.  He was started on sildenafil for pulmonary hypertension during hospitalization in May 2022.  He had right thoracentesis done in May and July of 2022.  In ER he was started on supplemental oxygen and Bipap.  He was found to have anemia with hemoglobin 7.  He had EGD in April 2022 that showed large duodenal ulcer, short segments of Barrett's esophagus, and erosive gastritis.  He reports his symptoms started on 03/30/21.  He isn't aware of having blood in his stool or melena.  He denies chest pain or fever.  Pertinent  Medical History  A fib, DM type 2, HLD, HTN, Pulmonary hypertension, Barrett's esophagus, CAD, PUD with upper GI bleeding, COPD, +ANA, Pneumococcal pneumonia April 2022  Studies:  Rt thoracentesis 09/18/20 >> 1200 ml fluid, glucose 220, protein < 3, LDH 139, 790 cells (92% L), cytology negative Rt thoracentesis 12/04/20 >> glucose 174, protein 4, LDH 124, 810 cells (93% L), cytology negative Echo 12/04/20 >> EF 55 to 60%, mod LVH, severe RV systolic dysfx, mod RV enlargement, RVSP 59.5 mmHg, mod LA and RA dilation CT chest 12/31/20 >> atherosclerosis, 2.4 cm subcarinal LN, 2.3 cm Rt hilar LN, 2 cm precarinal LN, multiloculated Rt pleural effusion Postthoracentesis for 1500 cc   Interim History / Subjective:   Slight decrease in FiO2 supplementation  Objective   Blood pressure 96/60, pulse 63, temperature (!) 97.1 F (36.2 C), temperature source Axillary, resp. rate 19,  height 5' 7"  (1.702 m), weight 68.8 kg, SpO2 100 %.    FiO2 (%):  [40 %] 40 %   Intake/Output Summary (Last 24 hours) at 04/15/2021 1130 Last data filed at 04/15/2021 0700 Gross per 24 hour  Intake 579.97 ml  Output 1620 ml  Net -1040.03 ml   Filed Weights   04/14/2021 1529 04/04/21 0414 04/04/21 1137  Weight: 68.4 kg 70.2 kg 68.8 kg    Examination: Gen: elderly male, critically ill CV: RRR, s1 s2  Resp: crackles, dminished bilaterally  Ext: no edema  Abd: soft nt nd   Resolved Hospital Problem list     Assessment & Plan:  Gary [name] is a 70 y.o. man with:  Acute on chronic respiratory failure Underlying chronic obstructive pulmonary disease Stage IV non-small cell lung cancer Loculated hydropneumothorax Pneumothorax ex vacuo s/p thoracentesis. -Continue oxygen supplementation -Post 1500 cc removal on thoracentesis -Continue anxiety medications  Not likely that his oxygen requirement will improve significantly enough to be able to transition to home According to documentation, spouse not interested in residential hospice option  Pulmonary hypertension -Continue current medications  Atrial fibrillation -Eliquis on hold  Continue other lines of care at present    Labs   CBC: Recent Labs  Lab 04/09/21 0218 04/14/21 0226  WBC 12.1* 8.7  HGB 8.0* 8.6*  HCT 27.1* 28.2*  MCV 82.1 80.6  PLT 469* 462*    Basic Metabolic Panel: Recent Labs  Lab 04/09/21 0218 04/11/21 0154  04/14/21 0226 04/15/21 0216  NA 136 137 137 134*  K 3.3* 3.6 3.3* 4.0  CL 96* 94* 90* 88*  CO2 33* 34* 40* 40*  GLUCOSE 138* 121* 99 167*  BUN 20 22 19 23   CREATININE 0.90 0.85 0.84 0.89  CALCIUM 7.9* 8.2* 7.8* 8.0*  MG  --   --  1.5* 2.3   GFR: Estimated Creatinine Clearance: 70.1 mL/min (by C-G formula based on SCr of 0.89 mg/dL). Recent Labs  Lab 04/09/21 0218 04/14/21 0226  WBC 12.1* 8.7    Liver Function Tests: No results for input(s): AST, ALT, ALKPHOS,  BILITOT, PROT, ALBUMIN in the last 168 hours. No results for input(s): LIPASE, AMYLASE in the last 168 hours. No results for input(s): AMMONIA in the last 168 hours.  ABG    Component Value Date/Time   PHART 7.484 (H) 03/30/2021 1003   PCO2ART 44.0 04/15/2021 1003   PO2ART 59.0 (L) 03/20/2021 1003   HCO3 32.4 (H) 04/11/2021 1003   TCO2 33 (H) 09/24/2020 1516   ACIDBASEDEF 4.0 (H) 09/07/2020 2236   O2SAT 89.3 03/28/2021 1003     Coagulation Profile: No results for input(s): INR, PROTIME in the last 168 hours.   Cardiac Enzymes: No results for input(s): CKTOTAL, CKMB, CKMBINDEX, TROPONINI in the last 168 hours.  HbA1C: Hgb A1c MFr Bld  Date/Time Value Ref Range Status  03/22/2021 09:58 AM 6.9 (H) 4.8 - 5.6 % Final    Comment:    (NOTE) Pre diabetes:          5.7%-6.4%  Diabetes:              >6.4%  Glycemic control for   <7.0% adults with diabetes   01/11/2021 02:54 AM 7.4 (H) 4.8 - 5.6 % Final    Comment:    (NOTE) Pre diabetes:          5.7%-6.4%  Diabetes:              >6.4%  Glycemic control for   <7.0% adults with diabetes     CBG: Recent Labs  Lab 04/14/21 0819 04/14/21 1124 04/14/21 1603 04/14/21 2216 04/15/21 0755  GLUCAP 119* 166* 265* 159* 126*    Sherrilyn Rist, MD [facility] PCCM Pager: See Shea Evans

## 2021-04-15 NOTE — Telephone Encounter (Signed)
BI please advise on message from pts wife.  thanks

## 2021-04-15 NOTE — Care Management Important Message (Signed)
Important Message  Patient Details  Name: Ibraham Levi MRN: [id number] Date of Birth: 10/10/1950   Medicare Important Message Given:  Yes     Shelda Altes 04/15/2021, 11:01 AM

## 2021-04-15 NOTE — Progress Notes (Signed)
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 Initial thoracentesis of loculated pleural effusion was unsuccessful. He was transferred to ICU at [facility] where 2L blood tinged fluid was drained with right thoracentesis on 11/17. Respiratory distress has continued despite diuretic and antibiotics after thoracentesis. Repeat thoracentesis (pleurx not performed due to severe orthopnea) performed 50/03 with 7.0W complicated by pneumothorax ex vacuo. FiO2 requirements have come down some. Palliative continues to follow.  Assessment & Plan: Active Problems:   Atrial fibrillation, chronic (HCC)   Right bundle branch block   Pleural effusion   Hyponatremia   COPD, severe (HCC)   Acute and chronic respiratory failure with hypoxia (HCC)   Acute on chronic respiratory failure with hypoxia and hypercapnia (HCC)   Acute on chronic diastolic CHF (congestive heart failure) (HCC)   Malignant pleural effusion  Acute on chronic hypoxic respiratory failure: Multifactorial with recurrent malignant pleural effusion, stage IV NSCLC, possible aspiration pneumonia on chronic pulmonary HTN, COPD, CHF on baseline deconditioning. Now with loculated left hydropneumothorax. - Pt currently requiring 15L O2 by HFNC, BiPAP overnight. PCCM and palliative continue to follow. Continue weaning efforts in hopes to decreasing enough to facilitate discharge, possibly with home hospice. - Can give morphine, e.g. if required for air hunger.  - Incentive spirometry, pulmonary hygiene. - Completed vancomycin, zosyn x7 days. - Continue inhaled  therapies - Continue to mobilize to whatever extent possible to minimize deconditioning. - Compensatory metabolic alkalosis noted. Given acetazolamide to avoid respiratory depression.  PAF: Currently NSR, 1st deg AVB.  - Continue amiodarone - Continue eliquis.   Hypotension:  - Continue midodrine at high dose. Hold parameters for torsemide, slidenafil placed.  Hx PUD:  - PPI BID  Chronic HFpEF:  - Tx HTN, continue baseline torsemide   Anemia of chronic disease: Roughly stable hgb in 8's. No bleeding reported.  Pulmonary HTN:  - Continue sildenafil.   T2DM: HbA1c 6.9%.  - Continue glargine insulin 10u and mod SSI  HLD:  - Continue statin  Anxiety:  - Continue buspar.    DVT prophylaxis: Eliquis Code Status: DNR Family Communication: None at bedside.  Disposition Plan:  Status is: Inpatient  Remains inpatient appropriate because: Continued respiratory failure.   Consultants:  PCCM Palliative  Procedures:  Right thoracentesis 11/16, 11/18, 11/25  Antimicrobials: Vancomycin, zosyn   Subjective: Feels his breathing is coming along, overall much less oxygen supplementation than last I saw him last week. No chest pain or other complaints. Wants to go home.   Objective: Vitals:   04/15/21 0346 04/15/21 0424 04/15/21 0756 04/15/21 1126  BP: (!) 90/52 (!) 90/52  96/60  Pulse: 65 66  63  Resp: 18 20 18 19   Temp: (!) 97.1 F (36.2 C)     TempSrc: Axillary   Oral  SpO2: 97% 97% 100% 100%  Weight:      Height:        Intake/Output Summary (Last 24 hours) at 04/15/2021 1649 Last data filed at 04/15/2021 1212 Gross per 24 hour  Intake 339.97 ml  Output 1710 ml  Net -1370.03 ml   Filed Weights   03/27/2021 1529 04/04/21 0414 04/04/21 1137  Weight: 68.4 kg 70.2 kg 68.8 kg   Gen: 70 y.o. male in no distress Pulm: Nonlabored breathing 15L HFNC at rest, coarse. CV: Regular rate and rhythm. No murmur, rub, or gallop. No JVD, no dependent edema. GI: Abdomen  soft, non-tender, non-distended, with normoactive bowel sounds.  Ext: Warm, no deformities Skin: No new rashes, lesions or ulcers on visualized skin. Neuro: Alert and oriented. No focal neurological deficits. Psych: Judgement and insight appear fair. Mood euthymic & affect congruent. Behavior is appropriate.    Data Reviewed: I have personally reviewed following labs and imaging studies  CBC: Recent Labs  Lab 04/09/21 0218 04/14/21 0226  WBC 12.1* 8.7  HGB 8.0* 8.6*  HCT 27.1* 28.2*  MCV 82.1 80.6  PLT 469* 354*   Basic Metabolic Panel: Recent Labs  Lab 04/09/21 0218 04/11/21 0154 04/14/21 0226 04/15/21 0216  NA 136 137 137 134*  K 3.3* 3.6 3.3* 4.0  CL 96* 94* 90* 88*  CO2 33* 34* 40* 40*  GLUCOSE 138* 121* 99 167*  BUN 20 22 19 23   CREATININE 0.90 0.85 0.84 0.89  CALCIUM 7.9* 8.2* 7.8* 8.0*  MG  --   --  1.5* 2.3   GFR: Estimated Creatinine Clearance: 70.1 mL/min (by C-G formula based on SCr of 0.89 mg/dL). Liver Function Tests: No results for input(s): AST, ALT, ALKPHOS, BILITOT, PROT, ALBUMIN in the last 168 hours. No results for input(s): LIPASE, AMYLASE in the last 168 hours. No results for input(s): AMMONIA in the last 168 hours. Coagulation Profile: No results for input(s): INR, PROTIME in the last 168 hours.  Cardiac Enzymes: No results for input(s): CKTOTAL, CKMB, CKMBINDEX, TROPONINI in the last 168 hours. BNP (last 3 results) No results for input(s): PROBNP in the last 8760 hours. HbA1C: No results for input(s): HGBA1C in the last 72 hours. CBG: Recent Labs  Lab 04/14/21 1603 04/14/21 2216 04/15/21 0755 04/15/21 1128 04/15/21 1611  GLUCAP 265* 159* 126* 149* 231*   Lipid Profile: No results for input(s): CHOL, HDL, LDLCALC, TRIG, CHOLHDL, LDLDIRECT in the last 72 hours. Thyroid Function Tests: No results for input(s): TSH, T4TOTAL, FREET4, T3FREE, THYROIDAB in the last 72 hours. Anemia Panel: No results for input(s): VITAMINB12, FOLATE,  FERRITIN, TIBC, IRON, RETICCTPCT in the last 72 hours.  Urine analysis:    Component Value Date/Time   COLORURINE YELLOW 01/10/2021 1400   APPEARANCEUR CLEAR 01/10/2021 1400   LABSPEC <1.005 (L) 01/10/2021 1400   PHURINE 7.0 01/10/2021 1400   GLUCOSEU >=500 (A) 01/10/2021 1400   HGBUR NEGATIVE 01/10/2021 1400   BILIRUBINUR NEGATIVE 01/10/2021 1400   KETONESUR NEGATIVE 01/10/2021 1400   PROTEINUR NEGATIVE 01/10/2021 1400   NITRITE NEGATIVE 01/10/2021 1400   LEUKOCYTESUR NEGATIVE 01/10/2021 1400   No results found for this or any previous visit (from the past 240 hour(s)).     Radiology Studies: No results found.  Scheduled Meds:  amiodarone  200 mg Oral Daily   apixaban  5 mg Oral BID   atorvastatin  40 mg Oral QPM   busPIRone  7.5 mg Oral BID   chlorhexidine  15 mL Mouth Rinse BID   ferrous sulfate  325 mg Oral Daily   fluticasone furoate-vilanterol  1 puff Inhalation Daily   insulin aspart  0-15 Units Subcutaneous TID WC   insulin glargine-yfgn  10 Units Subcutaneous Daily   ipratropium  2 spray Each Nare BID   magnesium oxide  400 mg Oral Daily   mouth rinse  15 mL Mouth Rinse  q12n4p   midodrine  10 mg Oral TID WC   multivitamin with minerals  1 tablet Oral Daily   pantoprazole  40 mg Oral BID   polyethylene glycol  17 g Oral Daily   Ensure Max Protein  11 oz Oral BID   sildenafil  20 mg Oral TID   torsemide  20 mg Oral BID   umeclidinium bromide  1 puff Inhalation Daily   Continuous Infusions:  sodium chloride Stopped (04/06/21 1445)     LOS: 12 days   Time spent: 35 minutes.  Patrecia Pour, MD [facility] [email or web address] 04/15/2021, 4:49 PM

## 2021-04-16 ENCOUNTER — Telehealth: Payer: Self-pay | Admitting: Pulmonary Disease

## 2021-04-16 DIAGNOSIS — I5033 Acute on chronic diastolic (congestive) heart failure: Secondary | ICD-10-CM | POA: Diagnosis not present

## 2021-04-16 DIAGNOSIS — J449 Chronic obstructive pulmonary disease, unspecified: Secondary | ICD-10-CM | POA: Diagnosis not present

## 2021-04-16 DIAGNOSIS — J9621 Acute and chronic respiratory failure with hypoxia: Secondary | ICD-10-CM | POA: Diagnosis not present

## 2021-04-16 DIAGNOSIS — I482 Chronic atrial fibrillation, unspecified: Secondary | ICD-10-CM | POA: Diagnosis not present

## 2021-04-16 LAB — GLUCOSE, CAPILLARY
Glucose-Capillary: 131 mg/dL — ABNORMAL HIGH (ref 70–99)
Glucose-Capillary: 177 mg/dL — ABNORMAL HIGH (ref 70–99)
Glucose-Capillary: 157 mg/dL — ABNORMAL HIGH (ref 70–99)
Glucose-Capillary: 210 mg/dL — ABNORMAL HIGH (ref 70–99)

## 2021-04-16 LAB — CYTOLOGY - NON PAP

## 2021-04-16 NOTE — Progress Notes (Signed)
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 Initial thoracentesis of loculated pleural effusion was unsuccessful. He was transferred to ICU at [facility] where 2L blood tinged fluid was drained with right thoracentesis on 11/17. Respiratory distress has continued despite diuretic and antibiotics after thoracentesis. Repeat thoracentesis (pleurx not performed due to severe orthopnea) performed 76/28 with 3.1D complicated by pneumothorax ex vacuo. FiO2 requirements have come down some. Palliative continues to follow.  Assessment & Plan: Active Problems:   Atrial fibrillation, chronic (HCC)   Right bundle branch block   Pleural effusion   Hyponatremia   COPD, severe (HCC)   Acute and chronic respiratory failure with hypoxia (HCC)   Acute on chronic respiratory failure with hypoxia and hypercapnia (HCC)   Acute on chronic diastolic CHF (congestive heart failure) (HCC)   Malignant pleural effusion  Acute on chronic hypoxic respiratory failure: Multifactorial with recurrent malignant pleural effusion, stage IV NSCLC, possible aspiration pneumonia on chronic pulmonary HTN, COPD, CHF on baseline deconditioning. Now with loculated left hydropneumothorax. - Pt currently requiring 10L O2, but desaturates into 70%'s with standing and short distance ambulation on this, takes 2 minutes to recover. Will continue HFNC, BiPAP overnight. PCCM and palliative continue to follow. Continue weaning efforts in hopes to decreasing enough to facilitate discharge, possibly with home hospice. - Can give morphine, e.g. if required  for air hunger.  - Incentive spirometry, pulmonary hygiene. - Completed vancomycin, zosyn x7 days. - Continue inhaled therapies - Continue to mobilize to whatever extent possible to minimize deconditioning. - Compensatory metabolic alkalosis noted. Given acetazolamide to avoid respiratory depression.  PAF: Currently NSR, 1st deg AVB.  - Continue amiodarone - Continue eliquis.   Hypotension:  - Continue midodrine at high dose. Hold parameters for torsemide, slidenafil placed.  Hx PUD:  - PPI BID  Chronic HFpEF:  - Tx HTN, continue baseline torsemide   Anemia of chronic disease: Roughly stable hgb in 8's. No bleeding reported.  Pulmonary HTN:  - Continue sildenafil.   T2DM: HbA1c 6.9%.  - Continue glargine insulin 10u and mod SSI  HLD:  - Continue statin  Anxiety:  - Continue buspar.    DVT prophylaxis: Eliquis Code Status: DNR Family Communication: None at bedside. Wife by phone Disposition Plan:  Status is: Inpatient  Remains inpatient appropriate because: Continued respiratory failure.   Consultants:  PCCM Palliative  Procedures:  Right thoracentesis 11/16, 11/18, 11/25  Antimicrobials: Vancomycin, zosyn   Subjective: Continues to endorse improving breathing effort, no chest or other pain currently. Getting up regularly despite significant desaturations. No fever. Wants to go home.  Objective: Vitals:   04/16/21 0406 04/16/21 0752 04/16/21 0851 04/16/21 1153  BP: (!) 93/52 (!) 96/57  (!) 85/45  Pulse: 74 72  74  Resp: 20 18  18   Temp: 97.9 F (36.6 C) 97.8 F (36.6 C)  98.3 F (36.8 C)  TempSrc: Axillary Axillary  Oral  SpO2: 97% 96% 92% 90%  Weight:      Height:        Intake/Output Summary (Last 24 hours) at 04/16/2021 1524 Last data filed at 04/16/2021 1200 Gross per 24 hour  Intake 480 ml  Output 2250 ml  Net -1770 ml   Filed Weights   04/15/2021 1529 04/04/21 0414 04/04/21 1137  Weight: 68.4 kg 70.2 kg 68.8 kg   Gen: 70 y.o. male  in no distress, appears ill but in good spirits. Pulm: Normal work of breathing at rest, on 10L O2, diminished throughout, worst at right base without wheeze or crackles. CV: Regular rate and rhythm. No murmur, rub, or gallop. No JVD, no pitting dependent edema. GI: Abdomen soft, non-tender, non-distended, with normoactive bowel sounds.  Ext: Warm, no deformities Skin: No new rashes, lesions or ulcers on visualized skin. Neuro: Alert and oriented. No focal neurological deficits. Psych: Judgement and insight appear fair. Mood euthymic & affect congruent. Behavior is appropriate.    Data Reviewed: I have personally reviewed following labs and imaging studies  CBC: Recent Labs  Lab 04/14/21 0226  WBC 8.7  HGB 8.6*  HCT 28.2*  MCV 80.6  PLT 654*   Basic Metabolic Panel: Recent Labs  Lab 04/11/21 0154 04/14/21 0226 04/15/21 0216  NA 137 137 134*  K 3.6 3.3* 4.0  CL 94* 90* 88*  CO2 34* 40* 40*  GLUCOSE 121* 99 167*  BUN 22 19 23   CREATININE 0.85 0.84 0.89  CALCIUM 8.2* 7.8* 8.0*  MG  --  1.5* 2.3   GFR: Estimated Creatinine Clearance: 70.1 mL/min (by C-G formula based on SCr of 0.89 mg/dL).  CBG: Recent Labs  Lab 04/15/21 1128 04/15/21 1611 04/15/21 2133 04/16/21 0747 04/16/21 1151  GLUCAP 149* 231* 273* 131* 177*   Scheduled Meds:  amiodarone  200 mg Oral Daily   apixaban  5 mg Oral BID   atorvastatin  40 mg Oral QPM   busPIRone  7.5 mg Oral BID   chlorhexidine  15 mL Mouth Rinse BID   ferrous sulfate  325 mg Oral Daily   fluticasone furoate-vilanterol  1 puff Inhalation Daily   insulin aspart  0-15 Units Subcutaneous TID WC   insulin glargine-yfgn  10 Units Subcutaneous Daily   ipratropium  2 spray Each Nare BID   magnesium oxide  400 mg Oral Daily   mouth rinse  15 mL Mouth Rinse q12n4p   midodrine  10 mg Oral TID WC   multivitamin with minerals  1 tablet Oral Daily   pantoprazole  40 mg Oral BID   polyethylene glycol  17 g Oral Daily   Ensure Max  Protein  11 oz Oral BID   sildenafil  20 mg Oral TID   torsemide  20 mg Oral BID   umeclidinium bromide  1 puff Inhalation Daily   Continuous Infusions:  sodium chloride Stopped (04/06/21 1445)     LOS: 13 days   Time spent: 25 minutes.  Patrecia Pour, MD [facility] [email or web address] 04/16/2021, 3:24 PM

## 2021-04-16 NOTE — Progress Notes (Signed)
NAME:  Gary [name], MRN:  [id number], DOB:  1951-04-03, LOS: 30 ADMISSION DATE:  03/30/2021, CONSULTATION DATE:  03/22/2021 REFERRING MD:  Dr. Karle Starch, ER, CHIEF COMPLAINT:  Short of breath   History of Present Illness:  70 yo male former smoker presented to [facility] ER with dyspnea and cough.  He has COPD and uses 6 liters oxygen.  He was to have thoracentesis for right pleural effusion by Dr. Valeta Harms on 04/04/21.  He had eliquis held in anticipation of this.  First noted to have pleural effusions in April 2022.  He was started on sildenafil for pulmonary hypertension during hospitalization in May 2022.  He had right thoracentesis done in May and July of 2022.  In ER he was started on supplemental oxygen and Bipap.  He was found to have anemia with hemoglobin 7.  He had EGD in April 2022 that showed large duodenal ulcer, short segments of Barrett's esophagus, and erosive gastritis.  He reports his symptoms started on 03/30/21.  He isn't aware of having blood in his stool or melena.  He denies chest pain or fever.  Pertinent  Medical History  A fib, DM type 2, HLD, HTN, Pulmonary hypertension, Barrett's esophagus, CAD, PUD with upper GI bleeding, COPD, +ANA, Pneumococcal pneumonia April 2022  Studies:  Rt thoracentesis 09/18/20 >> 1200 ml fluid, glucose 220, protein < 3, LDH 139, 790 cells (92% L), cytology negative Rt thoracentesis 12/04/20 >> glucose 174, protein 4, LDH 124, 810 cells (93% L), cytology negative Echo 12/04/20 >> EF 55 to 60%, mod LVH, severe RV systolic dysfx, mod RV enlargement, RVSP 59.5 mmHg, mod LA and RA dilation CT chest 12/31/20 >> atherosclerosis, 2.4 cm subcarinal LN, 2.3 cm Rt hilar LN, 2 cm precarinal LN, multiloculated Rt pleural effusion Postthoracentesis for 1500 cc   Interim History / Subjective:   Slight decrease in FiO2 supplementation Currently on 10 L of oxygen  Objective   Blood pressure (!) 96/57, pulse 72, temperature 97.8 F (36.6 C), temperature  source Axillary, resp. rate 18, height 5' 7"  (1.702 m), weight 68.8 kg, SpO2 92 %.        Intake/Output Summary (Last 24 hours) at 04/16/2021 1017 Last data filed at 04/16/2021 0840 Gross per 24 hour  Intake 840 ml  Output 2490 ml  Net -1650 ml   Filed Weights   03/21/2021 1529 04/04/21 0414 04/04/21 1137  Weight: 68.4 kg 70.2 kg 68.8 kg    Examination: Gen: Elderly male, does not appear to be in distress CV: S1-S2 appreciated Resp: Diminished air entry bilaterally Ext: No edema, no clubbing Abd: soft nt nd   Resolved Hospital Problem list     Assessment & Plan:  Gary [name] is a 70 y.o. man with:  Acute on chronic respiratory failure Underlying chronic obstructive pulmonary disease Stage IV non-small cell lung cancer Loculated hydropneumothorax Pneumothorax ex vacuo s/p thoracentesis -Continue oxygen supplementation -Continue anxiety medications -S/p recent thoracentesis for 1500 cc of fluid  Oxygen supplementation appears to be improving slowly however still beyond the limits that can be supplied at home  Pulmonary hypertension -Continue current medications  Atrial fibrillation -Eliquis on hold  Continue other lines of care at present    Labs   CBC: Recent Labs  Lab 04/14/21 0226  WBC 8.7  HGB 8.6*  HCT 28.2*  MCV 80.6  PLT 462*    Basic Metabolic Panel: Recent Labs  Lab 04/11/21 0154 04/14/21 0226 04/15/21 0216  NA 137 137 134*  K  3.6 3.3* 4.0  CL 94* 90* 88*  CO2 34* 40* 40*  GLUCOSE 121* 99 167*  BUN 22 19 23   CREATININE 0.85 0.84 0.89  CALCIUM 8.2* 7.8* 8.0*  MG  --  1.5* 2.3   GFR: Estimated Creatinine Clearance: 70.1 mL/min (by C-G formula based on SCr of 0.89 mg/dL). Recent Labs  Lab 04/14/21 0226  WBC 8.7    Liver Function Tests: No results for input(s): AST, ALT, ALKPHOS, BILITOT, PROT, ALBUMIN in the last 168 hours. No results for input(s): LIPASE, AMYLASE in the last 168 hours. No results for input(s): AMMONIA  in the last 168 hours.  ABG    Component Value Date/Time   PHART 7.484 (H) 03/23/2021 1003   PCO2ART 44.0 03/20/2021 1003   PO2ART 59.0 (L) 04/04/2021 1003   HCO3 32.4 (H) 03/27/2021 1003   TCO2 33 (H) 09/24/2020 1516   ACIDBASEDEF 4.0 (H) 09/07/2020 2236   O2SAT 89.3 04/02/2021 1003     Coagulation Profile: No results for input(s): INR, PROTIME in the last 168 hours.   Cardiac Enzymes: No results for input(s): CKTOTAL, CKMB, CKMBINDEX, TROPONINI in the last 168 hours.  HbA1C: Hgb A1c MFr Bld  Date/Time Value Ref Range Status  03/22/2021 09:58 AM 6.9 (H) 4.8 - 5.6 % Final    Comment:    (NOTE) Pre diabetes:          5.7%-6.4%  Diabetes:              >6.4%  Glycemic control for   <7.0% adults with diabetes   01/11/2021 02:54 AM 7.4 (H) 4.8 - 5.6 % Final    Comment:    (NOTE) Pre diabetes:          5.7%-6.4%  Diabetes:              >6.4%  Glycemic control for   <7.0% adults with diabetes     CBG: Recent Labs  Lab 04/15/21 0755 04/15/21 1128 04/15/21 1611 04/15/21 2133 04/16/21 0747  GLUCAP 126* 149* 231* 273* 131*    Sherrilyn Rist, MD [facility] Pager: See Shea Evans

## 2021-04-16 NOTE — Telephone Encounter (Signed)
Received forms, patient's wife is requesting ST disability if needed she currently has intermittent leave, but she wants [place] to fill out this form just in case she needs it. She is not out of work yet, just wants to have it because the patient is terminal. Emailed to [employer] for review.

## 2021-04-16 NOTE — Telephone Encounter (Signed)
Please advise on new note from the wife about patient coming home.

## 2021-04-16 NOTE — Progress Notes (Signed)
Physical Therapy Treatment Patient Details Name: Gary [name] MRN: [id number] DOB: 1951/04/27 Today's Date: 04/16/2021   History of Present Illness Patient is a 70 y/o male who was admitted 11/16 with SOB. Found to have acute on chronic hypoxemic respiratory failure and Right middle lobe mass with lymphadenopathy and recurrent right pleural effusion. Questionable placement of Pleurx catheter vs comfort care.  PMH includes DM, CHF, lung mass, COPD on home 02, A-fib, pulmonary HTN, CAD.    PT Comments    Pt making slow, steady progress with mobility within his activity tolerance. On 10L O2 and drops to 76% with standing and amb a couple of feet. Recovers to >90% after 2 minutes.    Recommendations for follow up therapy are one component of a multi-disciplinary discharge planning process, led by the attending physician.  Recommendations may be updated based on patient status, additional functional criteria and insurance authorization.  Follow Up Recommendations  Home health PT     Assistance Recommended at Discharge Frequent or constant Supervision/Assistance  Equipment Recommendations  Wheelchair (measurements PT);Wheelchair cushion (measurements PT);Hospital bed    Recommendations for Other Services       Precautions / Restrictions Precautions Precautions: Other (comment);Fall Precaution Comments: watch 02 and BP soft, HFNC     Mobility  Bed Mobility               General bed mobility comments: Pt up in chair    Transfers Overall transfer level: Needs assistance Equipment used: Rolling walker (2 wheels) Transfers: Sit to/from Stand Sit to Stand: Min guard;Min assist           General transfer comment: Min assist on initial stand to bring hips up. Min guard on 2nd stand for safety/lines. Verbal cues for hand placement    Ambulation/Gait Ambulation/Gait assistance: Min guard Gait Distance (Feet): 2 Feet (forward/backward x 2) Assistive device: Rolling  walker (2 wheels) Gait Pattern/deviations: Step-to pattern;Decreased step length - left;Decreased step length - right;Shuffle Gait velocity: decr Gait velocity interpretation: <1.31 ft/sec, indicative of household ambulator   General Gait Details: assist for safety and Heritage manager    Modified Rankin (Stroke Patients Only)       Balance Overall balance assessment: Needs assistance Sitting-balance support: No upper extremity supported;Feet supported Sitting balance-Leahy Scale: Good     Standing balance support: Bilateral upper extremity supported Standing balance-Leahy Scale: Poor Standing balance comment: walker and min guard for static standing                            Cognition Arousal/Alertness: Awake/alert Behavior During Therapy: WFL for tasks assessed/performed Overall Cognitive Status: Within Functional Limits for tasks assessed                                          Exercises      General Comments General comments (skin integrity, edema, etc.): Pt on 10L of HFNC. SpO2 95% at rest. Drops to 76% after standing/short steps. Recovers to >90% after 2 minutes      Pertinent Vitals/Pain Pain Assessment: No/denies pain    Home Living                          Prior Function  PT Goals (current goals can now be found in the care plan section) Progress towards PT goals: Progressing toward goals    Frequency    Min 3X/week      PT Plan Current plan remains appropriate    Co-evaluation              AM-PAC PT "6 Clicks" Mobility   Outcome Measure  Help needed turning from your back to your side while in a flat bed without using bedrails?: A Little Help needed moving from lying on your back to sitting on the side of a flat bed without using bedrails?: A Little Help needed moving to and from a bed to a chair (including a wheelchair)?: A Little Help needed  standing up from a chair using your arms (e.g., wheelchair or bedside chair)?: A Little Help needed to walk in hospital room?: A Little Help needed climbing 3-5 steps with a railing? : Total 6 Click Score: 16    End of Session Equipment Utilized During Treatment: Oxygen Activity Tolerance: Treatment limited secondary to medical complications (Comment);Patient limited by fatigue (low SpO2/high O2 needs) Patient left: in chair;with call bell/phone within reach   PT Visit Diagnosis: Muscle weakness (generalized) (M62.81);Unsteadiness on feet (R26.81);Difficulty in walking, not elsewhere classified (R26.2)     Time: 8768-1157 PT Time Calculation (min) (ACUTE ONLY): 14 min  Charges:  $Gait Training: 8-22 mins                     [place] Pager [phone] Office [place] 04/16/2021, 3:01 PM

## 2021-04-16 NOTE — Plan of Care (Signed)

## 2021-04-16 NOTE — Consult Note (Signed)
   [facility] Inpatient Consult   04/16/2021  Rip Hawes [facility] 04/29/1951 [id number]  [employer] [ACO] Patient: Medicare CMS DCE  Primary Care Provider:  [facility]  Patient screened for LOS with 3 hospitalizations in 6 months with noted extreme high risk score for unplanned readmission risk. To assess for potential [place] Management service needs for post hospital transition.  Review of patient's medical record progress notes and consults for progress and post hospital needs.  Plan:  Continue to follow progress and disposition to assess for post hospital care management needs.    For questions contact:   Natividad Brood, RN BSN [facility] Liaison  [phone] business mobile phone Toll free office [phone]  Fax number: [phone] [email or web address]

## 2021-04-17 ENCOUNTER — Inpatient Hospital Stay (HOSPITAL_COMMUNITY): Payer: Medicare Other

## 2021-04-17 DIAGNOSIS — J910 Malignant pleural effusion: Secondary | ICD-10-CM | POA: Diagnosis not present

## 2021-04-17 DIAGNOSIS — I5033 Acute on chronic diastolic (congestive) heart failure: Secondary | ICD-10-CM | POA: Diagnosis not present

## 2021-04-17 DIAGNOSIS — J90 Pleural effusion, not elsewhere classified: Secondary | ICD-10-CM | POA: Diagnosis not present

## 2021-04-17 DIAGNOSIS — J9621 Acute and chronic respiratory failure with hypoxia: Secondary | ICD-10-CM | POA: Diagnosis not present

## 2021-04-17 DIAGNOSIS — Z7189 Other specified counseling: Secondary | ICD-10-CM | POA: Diagnosis not present

## 2021-04-17 LAB — BASIC METABOLIC PANEL
Anion gap: 6 (ref 5–15)
BUN: 26 mg/dL — ABNORMAL HIGH (ref 8–23)
CO2: 37 mmol/L — ABNORMAL HIGH (ref 22–32)
Calcium: 8 mg/dL — ABNORMAL LOW (ref 8.9–10.3)
Chloride: 89 mmol/L — ABNORMAL LOW (ref 98–111)
Creatinine, Ser: 0.86 mg/dL (ref 0.61–1.24)
GFR, Estimated: >60 mL/min (ref >60)
Glucose, Bld: 202 mg/dL — ABNORMAL HIGH (ref 70–99)
Potassium: 3.6 mmol/L (ref 3.5–5.1)
Sodium: 132 mmol/L — ABNORMAL LOW (ref 135–145)

## 2021-04-17 LAB — GLUCOSE, CAPILLARY
Glucose-Capillary: 195 mg/dL — ABNORMAL HIGH (ref 70–99)
Glucose-Capillary: 187 mg/dL — ABNORMAL HIGH (ref 70–99)
Glucose-Capillary: 207 mg/dL — ABNORMAL HIGH (ref 70–99)
Glucose-Capillary: 136 mg/dL — ABNORMAL HIGH (ref 70–99)

## 2021-04-17 LAB — CBC
HCT: 24.9 % — ABNORMAL LOW (ref 39.0–52.0)
Hemoglobin: 7.3 g/dL — ABNORMAL LOW (ref 13.0–17.0)
MCH: 24.1 pg — ABNORMAL LOW (ref 26.0–34.0)
MCHC: 29.3 g/dL — ABNORMAL LOW (ref 30.0–36.0)
MCV: 82.2 fL (ref 80.0–100.0)
Platelets: 432 10*3/uL — ABNORMAL HIGH (ref 150–400)
RBC: 3.03 MIL/uL — ABNORMAL LOW (ref 4.22–5.81)
RDW: 19.9 % — ABNORMAL HIGH (ref 11.5–15.5)
WBC: 9.1 10*3/uL (ref 4.0–10.5)
nRBC: 0 % (ref 0.0–0.2)

## 2021-04-17 LAB — APTT: aPTT: 42 seconds — ABNORMAL HIGH (ref 24–36)

## 2021-04-17 MED ORDER — LEVALBUTEROL HCL 0.63 MG/3ML IN NEBU: 0.6300 mg | INHALATION_SOLUTION | Freq: Four times a day (QID) | RESPIRATORY_TRACT | Status: DC

## 2021-04-17 MED ORDER — LEVALBUTEROL HCL 0.63 MG/3ML IN NEBU
0.6300 mg | INHALATION_SOLUTION | Freq: Three times a day (TID) | RESPIRATORY_TRACT | Status: DC
  Administered 2021-04-17 – 2021-04-19 (×5): 0.63 mg via RESPIRATORY_TRACT
  Filled 2021-04-17 (×6): qty 3

## 2021-04-17 MED ORDER — HEPARIN (PORCINE) 25000 UT/250ML-% IV SOLN
1500.0000 [IU]/h | INTRAVENOUS | Status: DC
  Administered 2021-04-17: 15:00:00 1000 [IU]/h via INTRAVENOUS
  Filled 2021-04-17 (×2): qty 250

## 2021-04-17 NOTE — Progress Notes (Addendum)
04/17/2021   I have seen and evaluated the patient for recurrent pleural effusion.  S:  No events, down to 7LPM O2. Denies pain, SOB with exertion however.  O: Blood pressure (!) 89/53, pulse 75, temperature 99 F (37.2 C), temperature source Oral, resp. rate 20, height 5' 7"  (1.702 m), weight 68.8 kg, SpO2 96 %.  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  Will touch base with Icard, if he is okay with it will just place one with patient sitting up.  Eliquis unfortunately restarted, will have to hold again; will ask if Dr. Carlis Abbott can do Monday; can use heparin gtt as bridge  Erskine Emery MD [facility] Prefer epic messenger for cross cover needs If after hours, please call E-link

## 2021-04-17 NOTE — Plan of Care (Signed)

## 2021-04-17 NOTE — Progress Notes (Addendum)
CXR reviewed; discussed case with Icard and [place], will arrange for pleurX placement 04/22/21 in afternoon in endo.  Use heparin gtt bridge but stop Sunday PM.  No need for NPO status.  Discussed with primary.  Erskine Emery MD PCCM

## 2021-04-17 NOTE — Progress Notes (Signed)
PROGRESS NOTE    Gary [name]  MBE:[id number] DOB: 06/23/1950 DOA: 03/23/2021 PCP: Jalene Mullet, PA-C  Brief Narrative:  The patient is a 70 year old Caucasian male who is a retired Arts administrator with a past medical history significant for but not limited to history of chronic hypoxic respiratory failure, chronic history of heart failure with preserved ejection fraction, pulm hypertension, type 2 diabetes mellitus, history of progressive lung mass with bronchoscopy being declined by the patient who previously presented to [facility] 04/04/2021 with respiratory distress, hypoxia, hypotension requiring noninvasive positive pressure ventilation pressure support.  Initial thoracentesis of loculated pleural effusion was unsuccessful and he was transferred to the ICU at [facility] for a 2 L of blood-tinged fluid that was drained the right thoracentesis on 04/04/2021.  His respiratory distress continued despite diuretic antibiotics after his thoracentesis.  Repeat thoracentesis was performed on 04/08/2021 with 1.5 L of complicated pneumothorax ex vacuo.  His FiO2 requirements have come down some but he required significant mount oxygen and was on 10 L.  Today he is on 7 L and palliative continues to follow.  Pulmonary evaluated today feel that he would benefit from a Pleurx catheter.  It was not previously performed given his severe orthopnea but now the plan is for Pleurx placement on 04/23/2019 2 in the afternoon in the Endo suite.  He was anticoagulated with apixaban and this will need to be washed out so his oral anticoagulation was held and he was initiated on heparin drip which will need to be stopped Sunday p.m.  Assessment & Plan:   Active Problems:   Atrial fibrillation, chronic (HCC)   Right bundle branch block   Pleural effusion   Hyponatremia   COPD, severe (HCC)   Acute and chronic respiratory failure with hypoxia (HCC)   Acute on chronic respiratory failure with hypoxia and hypercapnia  (HCC)   Acute on chronic diastolic CHF (congestive heart failure) (HCC)   Malignant pleural effusion  Acute on Chronic Hypoxic Respiratory Failure -Is likely multifactorial in the setting of recurrent malignant pleural effusion and stage IV NSCLC, possible aspiration pneumonia, chronic pulm hypertension, underlying COPD in the setting of tobacco use disorder and 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 every 4 as needed for shortness of breath if required for air hunger -Continue with incentive spirometer, flutter valve and pulmonary hygiene -Patient is status post 7 days of antibiotics with IV vancomycin and IV Zosyn -Chest x-ray today showed "Increased size of the moderate-sized right pleural effusion with worsening aeration of the right lung.  No definite residual right pneumothorax. Slightly improved aeration of the left base." -P.o. Torsemide 20 mg p.o. twice daily -We will add Xopenex and Atrovent as needed every 6 -We will continue inhaled therapies with fluticasone furoate-vilanterol 1 puff IH daily and Incruse Ellipta 1 puff IH daily, ipratropium nasal spray 2 sprays in each nare twice daily -Continue sildenafil 20 g p.o. 3 times daily for his pulmonary hypertension -Continue to  mobilize toward her extent possible to minimize deconditioning -He has a compensatory metabolic alkalosis that is noted and has been given acetazolamide to avoid  respiratory depression  PAF -Currently in NSR, 1st Degree AVB -C/w Amiodarone 200 mg po Daily  -C/w Anticoagulation but change to Heparin gtt  Hypotension -Continue Midodrine 10 mg p.o. 3 times daily with meals  Hx of PUD/GERD/GI Prophylaxis -C/w PPI BID  Chronic Diastolic Heart Failure with Preserved EF  -As above -C/w Torsemide 20 mg po BID -Strict I's and O's and Daily Weights; Patient is -12.076 Liters  -Continue to Monitor for S/Sx of Volume Overload   Anemia of Chronic Disease -C/w Ferrous Sulfate 325 mg po Daily  -Patient's Hgb/Hct went from 8.1/27.4 -> 8.0/27.1 -> 8.6/28.2 -> 7.3/24.9 -Continue to Monitor for S/Sx of Bleeding; No overt bleeding noted -Repeat CBC in the AM  Hyponatremia -Na+ went from 137 -> 134 -> 132 -In the setting of Diuretics and Lung Cancer -Continue to Monitor and Trend and repeat CMP in the AM   Pulmonary HTN -C/w Sildenafil 20 mg po TID   Type 2 Diabetes Mellitus -Recent HbA1c was 6.9 -C/w Semglee 10 units sq Daily and Moderate Novolog SSI AC -CBGs ranging from 131-273  HLD -C/w Atorvastatin 40 mg po qHS  Anxiety -C/w Buspirone 7.5 mg po BID and Lorazepam 0.25 mg po q6hprn Anxiety   DVT prophylaxis: Anticoagulated with Eliquis but will be held and transition to heparin drip Code Status: DO NOT RESUSCITATE Family Communication: No family present at bedside  Disposition Plan: Pending further clinical work-up and clearance by pulmonary; he will need a Pleurx catheter and this will be placed on 04/22/2021  Status is: Inpatient  Remains inpatient appropriate because: Patient still requires high amount of oxygen will need a Pleurx catheter drainage    Consultants:  Palliative Care Pulmonary   Procedures:  Thoracentesis x2  Antimicrobials:  Anti-infectives (From admission, onward)    Start     Dose/Rate Route Frequency Ordered Stop   04/04/21 1400  piperacillin-tazobactam (ZOSYN) IVPB 3.375 g        3.375 g 12.5 mL/hr over  240 Minutes Intravenous Every 8 hours 04/04/21 1251 04/09/21 0955   04/04/21 0800  vancomycin (VANCOREADY) IVPB 750 mg/150 mL        750 mg 150 mL/hr over 60 Minutes Intravenous Every 12 hours 04/15/2021 1933 04/09/21 1000   03/31/2021 2200  ceFEPIme (MAXIPIME) 2 g in sodium chloride 0.9 % 100 mL IVPB  Status:  Discontinued        2 g 200 mL/hr over 30 Minutes Intravenous Every 12 hours 04/11/2021 1858 04/13/2021 1920   03/26/2021 2000  vancomycin (VANCOREADY) IVPB 1500 mg/300 mL        1,500 mg 150 mL/hr over 120 Minutes Intravenous  Once 03/30/2021 1917 04/11/2021 2317   03/29/2021 2000  ceFEPIme (MAXIPIME) 2 g in sodium chloride 0.9 % 100 mL IVPB  Status:  Discontinued        2 g 200 mL/hr over 30 Minutes Intravenous Every 8 hours 03/31/2021 1917 04/04/21 1214        Subjective: Seen and examined at bedside and he is doing okay.  Oxygen requirements are weaning.  Understands that he will be getting a Pleurx catheter placed on Monday.  No nausea or vomiting.  Denies any lightheadedness or dizziness.  No other concerns or complaints at this time  Objective: Vitals:   04/17/21 0900 04/17/21 1044 04/17/21 1100 04/17/21 1416  BP:  (!) 98/57 Marland Kitchen)  92/55 99/65  Pulse:  69 67 72  Resp:    19  Temp: 99 F (37.2 C)  98.3 F (36.8 C) 98.3 F (36.8 C)  TempSrc: Oral  Oral Oral  SpO2: 96% 98% 99% 100%  Weight:      Height:        Intake/Output Summary (Last 24 hours) at 04/17/2021 1433 Last data filed at 04/17/2021 1100 Gross per 24 hour  Intake 600 ml  Output 2025 ml  Net -1425 ml   Filed Weights   03/22/2021 1529 04/04/21 0414 04/04/21 1137  Weight: 68.4 kg 70.2 kg 68.8 kg   Examination: Physical Exam:  Constitutional: Thin elderly Caucasian male currently no acute distress appears calm Eyes: Lids and conjunctivae normal, sclerae anicteric  ENMT: External Ears, Nose appear normal. Grossly normal hearing. Mucous membranes are moist.  Neck: Appears normal, supple, no cervical masses, normal ROM,  no appreciable thyromegaly; no appreciable JVD Respiratory: Clear to auscultation bilaterally with coarse breath sounds and some rhonchi and crackles noted on the right, no wheezing, rales, rhonchi or crackles. Normal respiratory effort patient is slightly tachypneic. No accessory muscle use.  Wearing supplemental oxygen via nasal cannula Cardiovascular: RRR, no murmurs / rubs / gallops. S1 and S2 auscultated.  Trace extremity edema Abdomen: Soft, non-tender, non-distended.  Bowel sounds positive.  GU: Deferred. Musculoskeletal: No clubbing / cyanosis of digits/nails. No joint deformity upper and lower extremities.  Skin: No rashes, lesions, ulcers on limited skin evaluation. No induration; Warm and dry.  Neurologic: CN 2-12 grossly intact with no focal deficits. Romberg sign and cerebellar reflexes not assessed.  Psychiatric: Normal judgment and insight. Alert and oriented x 3. Normal mood and appropriate affect.   Data Reviewed: I have personally reviewed following labs and imaging studies  CBC: Recent Labs  Lab 04/14/21 0226 04/17/21 0235  WBC 8.7 9.1  HGB 8.6* 7.3*  HCT 28.2* 24.9*  MCV 80.6 82.2  PLT 462* 425*   Basic Metabolic Panel: Recent Labs  Lab 04/11/21 0154 04/14/21 0226 04/15/21 0216 04/17/21 0235  NA 137 137 134* 132*  K 3.6 3.3* 4.0 3.6  CL 94* 90* 88* 89*  CO2 34* 40* 40* 37*  GLUCOSE 121* 99 167* 202*  BUN 22 19 23  26*  CREATININE 0.85 0.84 0.89 0.86  CALCIUM 8.2* 7.8* 8.0* 8.0*  MG  --  1.5* 2.3  --    GFR: Estimated Creatinine Clearance: 72.6 mL/min (by C-G formula based on SCr of 0.86 mg/dL). Liver Function Tests: No results for input(s): AST, ALT, ALKPHOS, BILITOT, PROT, ALBUMIN in the last 168 hours. No results for input(s): LIPASE, AMYLASE in the last 168 hours. No results for input(s): AMMONIA in the last 168 hours. Coagulation Profile: No results for input(s): INR, PROTIME in the last 168 hours. Cardiac Enzymes: No results for input(s):  CKTOTAL, CKMB, CKMBINDEX, TROPONINI in the last 168 hours. BNP (last 3 results) No results for input(s): PROBNP in the last 8760 hours. HbA1C: No results for input(s): HGBA1C in the last 72 hours. CBG: Recent Labs  Lab 04/16/21 1151 04/16/21 1556 04/16/21 2146 04/17/21 0720 04/17/21 1124  GLUCAP 177* 157* 210* 195* 187*   Lipid Profile: No results for input(s): CHOL, HDL, LDLCALC, TRIG, CHOLHDL, LDLDIRECT in the last 72 hours. Thyroid Function Tests: No results for input(s): TSH, T4TOTAL, FREET4, T3FREE, THYROIDAB in the last 72 hours. Anemia Panel: No results for input(s): VITAMINB12, FOLATE, FERRITIN, TIBC, IRON, RETICCTPCT in the last 72 hours. Sepsis Labs: No results for  input(s): PROCALCITON, LATICACIDVEN in the last 168 hours.  No results found for this or any previous visit (from the past 240 hour(s)).   RN Pressure Injury Documentation:     Estimated body mass index is 23.76 kg/m as calculated from the following:   Height as of this encounter: 5' 7"  (1.702 m).   Weight as of this encounter: 68.8 kg.  Malnutrition Type:   Malnutrition Characteristics:   Nutrition Interventions:    Radiology Studies: DG Chest Port 1 View  Result Date: 04/17/2021 CLINICAL DATA:  Pleural effusion, shortness of breath EXAM: PORTABLE CHEST 1 VIEW COMPARISON:  Chest radiograph 04/13/2021 FINDINGS: Cardiomediastinal silhouette is stable. There is a moderate size right pleural effusion which appears increased in size since 04/13/2021. There is worsening aeration of the right lower lobe. The right upper lung remains aerated. The previously seen right pneumothorax is not appreciated. Aeration of the left base appears improved. There is no new or worsening airspace disease on the left. There is no significant left effusion. There is no left pneumothorax. There is no acute osseous abnormality. IMPRESSION: 1. Increased size of the moderate-sized right pleural effusion with worsening aeration of  the right lung. 2. No definite residual right pneumothorax. 3. Slightly improved aeration of the left base. Electronically Signed   By: Valetta Mole M.D.   On: 04/17/2021 13:03    Scheduled Meds:  amiodarone  200 mg Oral Daily   atorvastatin  40 mg Oral QPM   busPIRone  7.5 mg Oral BID   chlorhexidine  15 mL Mouth Rinse BID   ferrous sulfate  325 mg Oral Daily   fluticasone furoate-vilanterol  1 puff Inhalation Daily   insulin aspart  0-15 Units Subcutaneous TID WC   insulin glargine-yfgn  10 Units Subcutaneous Daily   ipratropium  2 spray Each Nare BID   magnesium oxide  400 mg Oral Daily   mouth rinse  15 mL Mouth Rinse q12n4p   midodrine  10 mg Oral TID WC   multivitamin with minerals  1 tablet Oral Daily   pantoprazole  40 mg Oral BID   polyethylene glycol  17 g Oral Daily   Ensure Max Protein  11 oz Oral BID   sildenafil  20 mg Oral TID   torsemide  20 mg Oral BID   umeclidinium bromide  1 puff Inhalation Daily   Continuous Infusions:  sodium chloride Stopped (04/06/21 1445)   heparin      LOS: 14 days   Kerney Elbe, DO [facility] PAGER is on AMION  If 7PM-7AM, please contact night-coverage [email or web address]
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
## 2021-04-17 NOTE — Progress Notes (Addendum)
Daily Progress Note   Patient Name: Gary [name]       Date: 04/17/2021 DOB: 25-Jan-1951  Age: 70 y.o. MRN#: [id number] Attending Physician: Kerney Elbe, DO Primary Care Physician: Riley Lam Admit Date: 03/28/2021  Reason for Consultation/Follow-up: Establishing goals of care  Patient Profile/HPI: 70 y.o. male  with past medical history of a. Fib, pulmonary hypertension, CHF- cor pulmonale (R ventricular failure), DM2, hypothyroidism, anxiety with associated panic attacks, HLD, HTN, GERD, R lung mass with associated lymphadenopathy (have not been able to biopsy), recurrent pleural effusions s/p several thoracentises, chronic respiratory failure on home oxygen,  admitted on 04/09/2021 with worsening shortness of breath. Palliative medicine consulted for goals of care discussion in light of significant lung disease that continues to worsen.  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   Subjective: Gary [name] is sitting up in chair. Appears short of breath- but reports he feels good. Noted decreased O2 to 7L today.  Called Gary [name]. Emotional support provided. Endorses plan to continue current care. She has questions re: placement of pleurx catheter.   ROS   Physical Exam Vitals and nursing note reviewed.  Constitutional:      Appearance: He is ill-appearing.  Pulmonary:     Comments: Appears SOB, but does not report subjective SOB Neurological:     Mental Status: He is alert.            Vital Signs: BP (!) 92/55   Pulse 67   Temp 98.3 F (36.8 C) (Oral)   Resp 20   Ht 5' 7"  (1.702 m)   Wt 68.8 kg   SpO2 99%   BMI 23.76 kg/m  SpO2: SpO2: 99 % O2 Device: O2 Device: High Flow Nasal Cannula O2 Flow Rate: O2 Flow Rate (L/min): 9  L/min  Intake/output summary:  Intake/Output Summary (Last 24 hours) at 04/17/2021 1153 Last data filed at 04/17/2021 1100 Gross per 24 hour  Intake 600 ml  Output 2175 ml  Net -1575 ml   LBM: Last BM Date: 04/15/21 Baseline Weight: Weight: 76.9 kg Most recent weight: Weight: 68.8 kg       Palliative Assessment/Data: PPS: 40%        Palliative Care Assessment & Plan    Assessment/Recommendations/Plan  Continue current plan PMT will continue to  follow and provide support   Code Status: DNR  Prognosis:  < 6 months  Discharge Planning: To Be Determined  Care plan was discussed with patient's spouse- Gary [name].   Thank you for allowing the Palliative Medicine Team to assist in the care of this patient.  Total time: 40 mins Prolonged billing: No     Greater than 50%  of this time was spent counseling and coordinating care related to the above assessment and plan.  Mariana Kaufman, AGNP-C Palliative Medicine   Please contact Palliative Medicine Team phone at [phone] for questions and concerns.

## 2021-04-17 NOTE — Telephone Encounter (Signed)
Dr. Valeta Harms please advise on the follow My Chart message;   Good Morning,       Can you please give Dr Valeta Harms another message? Dr Tamala Julian has been to see Gary [name] and told him that he could place the drain while he sits up .... after I thought this was a closed discussion based on [name] health? Please tell me what's happening.. too much conflicting information.  And this may be due to his o2 down to 8-10l  but when he moves or stands  his oxygen  drops to lthe low 70's. .  Sorry this is [name] his wife again.     Thank you

## 2021-04-17 NOTE — Progress Notes (Signed)
Pt placed on CPAP mode with his nasal mask.  Patient FI02 increased on the CPAP from 40% to 60% to maintain a SP02 of 90 or >.

## 2021-04-18 ENCOUNTER — Inpatient Hospital Stay (HOSPITAL_COMMUNITY): Payer: Medicare Other

## 2021-04-18 DIAGNOSIS — I5033 Acute on chronic diastolic (congestive) heart failure: Secondary | ICD-10-CM | POA: Diagnosis not present

## 2021-04-18 DIAGNOSIS — I482 Chronic atrial fibrillation, unspecified: Secondary | ICD-10-CM | POA: Diagnosis not present

## 2021-04-18 DIAGNOSIS — Z515 Encounter for palliative care: Secondary | ICD-10-CM | POA: Diagnosis not present

## 2021-04-18 DIAGNOSIS — J90 Pleural effusion, not elsewhere classified: Secondary | ICD-10-CM | POA: Diagnosis not present

## 2021-04-18 DIAGNOSIS — J9621 Acute and chronic respiratory failure with hypoxia: Secondary | ICD-10-CM | POA: Diagnosis not present

## 2021-04-18 DIAGNOSIS — J449 Chronic obstructive pulmonary disease, unspecified: Secondary | ICD-10-CM | POA: Diagnosis not present

## 2021-04-18 DIAGNOSIS — J910 Malignant pleural effusion: Secondary | ICD-10-CM | POA: Diagnosis not present

## 2021-04-18 LAB — GLUCOSE, CAPILLARY
Glucose-Capillary: 122 mg/dL — ABNORMAL HIGH (ref 70–99)
Glucose-Capillary: 227 mg/dL — ABNORMAL HIGH (ref 70–99)
Glucose-Capillary: 133 mg/dL — ABNORMAL HIGH (ref 70–99)
Glucose-Capillary: 155 mg/dL — ABNORMAL HIGH (ref 70–99)

## 2021-04-18 LAB — HEPARIN LEVEL (UNFRACTIONATED): Heparin Unfractionated: >1.1 IU/mL — ABNORMAL HIGH (ref 0.30–0.70)

## 2021-04-18 LAB — CBC WITH DIFFERENTIAL/PLATELET
Abs Immature Granulocytes: 0.05 10*3/uL (ref 0.00–0.07)
Basophils Absolute: 0.1 10*3/uL (ref 0.0–0.1)
Basophils Relative: 1 %
Eosinophils Absolute: 0.2 10*3/uL (ref 0.0–0.5)
Eosinophils Relative: 2 %
HCT: 25 % — ABNORMAL LOW (ref 39.0–52.0)
Hemoglobin: 7.2 g/dL — ABNORMAL LOW (ref 13.0–17.0)
Immature Granulocytes: 1 %
Lymphocytes Relative: 8 %
Lymphs Abs: 0.8 10*3/uL (ref 0.7–4.0)
MCH: 23.6 pg — ABNORMAL LOW (ref 26.0–34.0)
MCHC: 28.8 g/dL — ABNORMAL LOW (ref 30.0–36.0)
MCV: 82 fL (ref 80.0–100.0)
Monocytes Absolute: 1 10*3/uL (ref 0.1–1.0)
Monocytes Relative: 10 %
Neutro Abs: 8 10*3/uL — ABNORMAL HIGH (ref 1.7–7.7)
Neutrophils Relative %: 78 %
Platelets: 465 10*3/uL — ABNORMAL HIGH (ref 150–400)
RBC: 3.05 MIL/uL — ABNORMAL LOW (ref 4.22–5.81)
RDW: 19.9 % — ABNORMAL HIGH (ref 11.5–15.5)
WBC: 10 10*3/uL (ref 4.0–10.5)
nRBC: 0 % (ref 0.0–0.2)

## 2021-04-18 LAB — COMPREHENSIVE METABOLIC PANEL
ALT: 81 U/L — ABNORMAL HIGH (ref 0–44)
AST: 67 U/L — ABNORMAL HIGH (ref 15–41)
Albumin: 1.8 g/dL — ABNORMAL LOW (ref 3.5–5.0)
Alkaline Phosphatase: 83 U/L (ref 38–126)
Anion gap: 8 (ref 5–15)
BUN: 24 mg/dL — ABNORMAL HIGH (ref 8–23)
CO2: 37 mmol/L — ABNORMAL HIGH (ref 22–32)
Calcium: 8.1 mg/dL — ABNORMAL LOW (ref 8.9–10.3)
Chloride: 89 mmol/L — ABNORMAL LOW (ref 98–111)
Creatinine, Ser: 0.76 mg/dL (ref 0.61–1.24)
GFR, Estimated: >60 mL/min (ref >60)
Glucose, Bld: 147 mg/dL — ABNORMAL HIGH (ref 70–99)
Potassium: 3.6 mmol/L (ref 3.5–5.1)
Sodium: 134 mmol/L — ABNORMAL LOW (ref 135–145)
Total Bilirubin: 0.5 mg/dL (ref 0.3–1.2)
Total Protein: 5.8 g/dL — ABNORMAL LOW (ref 6.5–8.1)

## 2021-04-18 LAB — APTT
aPTT: 43 seconds — ABNORMAL HIGH (ref 24–36)
aPTT: 51 seconds — ABNORMAL HIGH (ref 24–36)

## 2021-04-18 LAB — MAGNESIUM: Magnesium: 1.8 mg/dL (ref 1.7–2.4)

## 2021-04-18 LAB — PHOSPHORUS: Phosphorus: 4.2 mg/dL (ref 2.5–4.6)

## 2021-04-18 MED ORDER — GLYCOPYRROLATE 0.2 MG/ML IJ SOLN
0.2000 mg | INTRAMUSCULAR | Status: DC | PRN
  Administered 2021-04-20: 0.2 mg via INTRAVENOUS
  Filled 2021-04-18: qty 1

## 2021-04-18 MED ORDER — GLYCOPYRROLATE 0.2 MG/ML IJ SOLN: 0.2000 mg | INTRAMUSCULAR | Status: DC | PRN

## 2021-04-18 MED ORDER — GLYCOPYRROLATE 1 MG PO TABS
1.0000 mg | ORAL_TABLET | ORAL | Status: DC | PRN
  Filled 2021-04-18: qty 1

## 2021-04-18 NOTE — Progress Notes (Signed)
PT Cancellation Note  Patient Details Name: Gary [name] MRN: [id number] DOB: 1951/01/12   Cancelled Treatment:    Reason Eval/Treat Not Completed: (P) Patient declined, no reason specified Pt sleeping, rouses briefly and declines PT services today. POC meeting earlier this morning and pt likely transitioning to comfort care PT will follow back tomorrow.   Gratia Disla B. Migdalia Dk PT, DPT Acute Rehabilitation Services Pager [phone] Office [phone]   [place] 04/18/2021, 11:34 AM
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
## 2021-04-18 NOTE — Progress Notes (Signed)
PROGRESS NOTE    Gary [name]  QBH:[id number] DOB: 14-Feb-1951 DOA: 03/20/2021 PCP: Jalene Mullet, PA-C  Brief Narrative:  The patient is a 70 year old Caucasian male who is a retired Arts administrator with a past medical history significant for but not limited to history of chronic hypoxic respiratory failure, chronic history of heart failure with preserved ejection fraction, pulm hypertension, type 2 diabetes mellitus, history of progressive lung mass with bronchoscopy being declined by the patient who previously presented to [facility] 04/04/2021 with respiratory distress, hypoxia, hypotension requiring noninvasive positive pressure ventilation pressure support.  Initial thoracentesis of loculated pleural effusion was unsuccessful and he was transferred to the ICU at [facility] for a 2 L of blood-tinged fluid that was drained the right thoracentesis on 04/04/2021.  His respiratory distress continued despite diuretic antibiotics after his thoracentesis.  Repeat thoracentesis was performed on 04/01/2021 with 1.5 L of complicated pneumothorax ex vacuo.  His FiO2 requirements have come down some but he required significant mount oxygen and was on 10 L.  Today he is on 7 L and palliative continues to follow.    Pulmonary evaluated yesterday and felt that he would benefit from a Pleurx catheter.  It was not previously performed given his severe orthopnea but now the plan is for Pleurx placement on 04/22/2021 in the afternoon in the Endo suite.  He was anticoagulated with apixaban and this will need to be washed out so his oral anticoagulation was held and he was initiated on heparin drip which will need to be stopped Sunday p.m. however after further goals of care discussion with the pulmonologist today Dr. Lake Bells discussed the fact the Pleurx catheter may bring some symptom relief but would not change his overall prognosis and that the risk of placing it was nontrivial.  The patient and his family ultimately  decided that best supportive option would be hospice and they would pursue another thoracentesis to see if it would provide symptom relief.  Plan is now to cancel the Pleurx catheter on Monday and just to repeat a thoracentesis on 04/19/2021.  Palliative care is actively involved and patient would like to go home with hospice and so referral has been made to [facility].  We will continue Robinul.  Assessment & Plan:   Active Problems:   Atrial fibrillation, chronic (HCC)   Right bundle branch block   Pleural effusion   Hyponatremia   COPD, severe (HCC)   Acute and chronic respiratory failure with hypoxia (HCC)   Acute on chronic respiratory failure with hypoxia and hypercapnia (HCC)   Acute on chronic diastolic CHF (congestive heart failure) (HCC)   Malignant pleural effusion  Acute on Chronic Hypoxic Respiratory Failure -Is likely multifactorial in the setting of recurrent malignant pleural effusion and stage IV NSCLC, possible aspiration pneumonia, chronic pulm hypertension, underlying COPD in the setting of tobacco use disorder and 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, CHF as well as baseline deconditioning and now a loculated left hydropneumothorax -He is currently requiring 10 L supplemental oxygen but was weaned down to 7 this morning; he desaturates to the 70s with standing and short distance ambulation and takes about 2 minutes to recover -Continue high flow nasal cannula and BiPAP overnight -Plan was for Pleurx catheter placement on 04/22/2021 but after further goals of care discussion with the pulmonologist today the patient elects to go on to go home with hospice and will be pursuing a repeat thoracentesis on 04/19/2021 for symptom relief -PCCM and palliative care is  following -We will continue weaning efforts in hopes of decreasing up to facilitate discharge home with hospice -We will try morphine 2.5 mg p.o. every 4 as needed for shortness of breath if required for air  hunger -Continue with incentive spirometer, flutter valve and pulmonary hygiene -Patient is status post 7 days of antibiotics with IV vancomycin and IV Zosyn -Chest x-ray yesterday showed "Increased size of the moderate-sized right pleural effusion with worsening aeration of the right lung.  No definite residual right pneumothorax. Slightly improved aeration of the left base." -P.o. Torsemide 20 mg p.o. twice daily -We will add Xopenex and Atrovent as needed every 6 -We will continue inhaled therapies with fluticasone furoate-vilanterol 1 puff IH daily and Incruse Ellipta 1 puff IH daily, ipratropium nasal spray 2 sprays in each nare twice daily -Continue sildenafil 20 g p.o. 3 times daily for his pulmonary hypertension -Continue to mobilize toward her extent possible to minimize deconditioning -He has a compensatory metabolic alkalosis that is noted and has been given acetazolamide to avoid respiratory depression  PAF -Currently in NSR, 1st Degree AVB -C/w Amiodarone 200 mg po Daily  -C/w Anticoagulation but change to Heparin gtt and will continue for now  Hypotension -Continue Midodrine 10 mg p.o. 3 times daily with meals  Hx of PUD/GERD/GI Prophylaxis -C/w PPI BID 40 mg po BID   Chronic Diastolic Heart Failure with Preserved EF  -As above -C/w Torsemide 20 mg po BID -Strict I's and O's and Daily Weights; Patient is -13.192 Liters  -Continue to Monitor for S/Sx of Volume Overload   Anemia of Chronic Disease -C/w Ferrous Sulfate 325 mg po Daily  -Patient's Hgb/Hct went from 8.1/27.4 -> 8.0/27.1 -> 8.6/28.2 -> 7.3/24.9 -Continue to Monitor for S/Sx of Bleeding; No overt bleeding noted -Repeat CBC in the AM  Hyponatremia -Na+ went from 137 -> 134 -> 132 -In the setting of Diuretics and Lung Cancer -Continue to Monitor and Trend and repeat CMP in the AM   Pulmonary HTN -C/w Sildenafil 20 mg po TID   Type 2 Diabetes Mellitus -Recent HbA1c was 6.9 -C/w Semglee 10 units sq  Daily and Moderate Novolog SSI AC -CBGs ranging from 131-273  HLD -C/w Atorvastatin 40 mg po qHS  Anxiety -C/w Buspirone 7.5 mg po BID and Lorazepam 0.25 mg po q6hprn Anxiety   DVT prophylaxis: Anticoagulated with Eliquis but will be held and transition to heparin drip Code Status: DO NOT RESUSCITATE Family Communication: No family present at bedside  Disposition Plan: Pending further clinical work-up and clearance by pulmonary; Likely Home Hospice; Will need Repeat Thoracentesis   Status is: Inpatient  Remains inpatient appropriate because: Patient still requires high amount of oxygen will need a Pleurx catheter drainage    Consultants:  Palliative Care Pulmonary   Procedures:  Thoracentesis x2  Antimicrobials:  Anti-infectives (From admission, onward)    Start     Dose/Rate Route Frequency Ordered Stop   04/04/21 1400  piperacillin-tazobactam (ZOSYN) IVPB 3.375 g        3.375 g 12.5 mL/hr over 240 Minutes Intravenous Every 8 hours 04/04/21 1251 04/09/21 0955   04/04/21 0800  vancomycin (VANCOREADY) IVPB 750 mg/150 mL        750 mg 150 mL/hr over 60 Minutes Intravenous Every 12 hours 03/30/2021 1933 04/09/21 1000   03/26/2021 2200  ceFEPIme (MAXIPIME) 2 g in sodium chloride 0.9 % 100 mL IVPB  Status:  Discontinued        2 g 200 mL/hr over 30 Minutes  Intravenous Every 12 hours 04/11/2021 1858 04/10/2021 1920   04/07/2021 2000  vancomycin (VANCOREADY) IVPB 1500 mg/300 mL        1,500 mg 150 mL/hr over 120 Minutes Intravenous  Once 04/04/2021 1917 04/11/2021 2317   03/21/2021 2000  ceFEPIme (MAXIPIME) 2 g in sodium chloride 0.9 % 100 mL IVPB  Status:  Discontinued        2 g 200 mL/hr over 30 Minutes Intravenous Every 8 hours 03/22/2021 1917 04/04/21 1214        Subjective: Seen and examined at bedside and he is sitting in the chair bedside.  Continues to be short of breath.  Had a long discussion with the pulmonologist and will be pursuing a repeat thoracentesis tomorrow.  Has elected  not to go for the Pleurx catheter and denies any other concerns or complaints this time.  Objective: Vitals:   04/18/21 0910 04/18/21 1200 04/18/21 1506 04/18/21 1515  BP:   (!) 98/53 (!) 98/53  Pulse: 71  73 73  Resp: 20  20 20   Temp:   98.6 F (37 C)   TempSrc:   Oral   SpO2: 99% 100% 95% 92%  Weight:      Height:        Intake/Output Summary (Last 24 hours) at 04/18/2021 1835 Last data filed at 04/18/2021 1700 Gross per 24 hour  Intake 798.66 ml  Output 1750 ml  Net -951.34 ml    Filed Weights   04/05/2021 1529 04/04/21 0414 04/04/21 1137  Weight: 68.4 kg 70.2 kg 68.8 kg   Examination: Physical Exam:  Constitutional: Thin elderly Caucasian male currently no acute distress appears calm sitting in the chair bedside Eyes: Lids and conjunctivae normal, sclerae anicteric  ENMT: External Ears, Nose appear normal. Grossly normal hearing. Mucous membranes are moist Neck: Appears normal, supple, no cervical masses, normal ROM, no appreciable thyromegaly; no JVD Respiratory: Diminished to auscultation bilaterally with coarse breath sounds and some rhonchi and crackles, no wheezing, rales, rhonchi or crackles. Normal respiratory effort and patient is not tachypenic. No accessory muscle use. Increased Respiratory Effort  Cardiovascular: RRR, no murmurs / rubs / gallops. S1 and S2 auscultated. No extremity edema. Abdomen: Soft, non-tender, non-distended. Bowel sounds positive.  GU: Deferred. Musculoskeletal: No clubbing / cyanosis of digits/nails. No joint deformity upper and lower extremities.  Skin: No rashes, lesions, ulcers on a limited skin evaluation. No induration; Warm and dry.  Neurologic: CN 2-12 grossly intact with no focal deficits. Romberg sign and cerebellar reflexes not assessed.  Psychiatric: Normal judgment and insight. Alert and oriented x 3. Normal mood and appropriate affect.   Data Reviewed: I have personally reviewed following labs and imaging  studies  CBC: Recent Labs  Lab 04/14/21 0226 04/17/21 0235 04/18/21 0237  WBC 8.7 9.1 10.0  NEUTROABS  --   --  8.0*  HGB 8.6* 7.3* 7.2*  HCT 28.2* 24.9* 25.0*  MCV 80.6 82.2 82.0  PLT 462* 432* 465*    Basic Metabolic Panel: Recent Labs  Lab 04/14/21 0226 04/15/21 0216 04/17/21 0235 04/18/21 0237  NA 137 134* 132* 134*  K 3.3* 4.0 3.6 3.6  CL 90* 88* 89* 89*  CO2 40* 40* 37* 37*  GLUCOSE 99 167* 202* 147*  BUN 19 23 26* 24*  CREATININE 0.84 0.89 0.86 0.76  CALCIUM 7.8* 8.0* 8.0* 8.1*  MG 1.5* 2.3  --  1.8  PHOS  --   --   --  4.2    GFR: Estimated Creatinine  Clearance: 78 mL/min (by C-G formula based on SCr of 0.76 mg/dL). Liver Function Tests: Recent Labs  Lab 04/18/21 0237  AST 67*  ALT 81*  ALKPHOS 83  BILITOT 0.5  PROT 5.8*  ALBUMIN 1.8*   No results for input(s): LIPASE, AMYLASE in the last 168 hours. No results for input(s): AMMONIA in the last 168 hours. Coagulation Profile: No results for input(s): INR, PROTIME in the last 168 hours. Cardiac Enzymes: No results for input(s): CKTOTAL, CKMB, CKMBINDEX, TROPONINI in the last 168 hours. BNP (last 3 results) No results for input(s): PROBNP in the last 8760 hours. HbA1C: No results for input(s): HGBA1C in the last 72 hours. CBG: Recent Labs  Lab 04/17/21 1541 04/17/21 2126 04/18/21 0733 04/18/21 1152 04/18/21 1603  GLUCAP 207* 136* 122* 227* 133*    Lipid Profile: No results for input(s): CHOL, HDL, LDLCALC, TRIG, CHOLHDL, LDLDIRECT in the last 72 hours. Thyroid Function Tests: No results for input(s): TSH, T4TOTAL, FREET4, T3FREE, THYROIDAB in the last 72 hours. Anemia Panel: No results for input(s): VITAMINB12, FOLATE, FERRITIN, TIBC, IRON, RETICCTPCT in the last 72 hours. Sepsis Labs: No results for input(s): PROCALCITON, LATICACIDVEN in the last 168 hours.  No results found for this or any previous visit (from the past 240 hour(s)).   RN Pressure Injury Documentation:      Estimated body mass index is 23.76 kg/m as calculated from the following:   Height as of this encounter: 5' 7"  (1.702 m).   Weight as of this encounter: 68.8 kg.  Malnutrition Type:   Malnutrition Characteristics:   Nutrition Interventions:    Radiology Studies: DG CHEST PORT 1 VIEW  Result Date: 04/18/2021 CLINICAL DATA:  Shortness of breath, effusion EXAM: PORTABLE CHEST 1 VIEW COMPARISON:  Chest radiograph 04/17/2021 FINDINGS: The cardiomediastinal silhouette is stable. A moderate-sized right pleural effusion is again seen with adjacent airspace opacity, overall not significantly changed in the interim. The left lung remains clear. There is no left effusion. There is no pneumothorax. There is no acute osseous abnormality. IMPRESSION: Unchanged moderate sized right pleural effusion with adjacent airspace disease. Overall, no significant interval change. Electronically Signed   By: Valetta Mole M.D.   On: 04/18/2021 08:24   DG Chest Port 1 View  Result Date: 04/17/2021 CLINICAL DATA:  Pleural effusion, shortness of breath EXAM: PORTABLE CHEST 1 VIEW COMPARISON:  Chest radiograph 04/13/2021 FINDINGS: Cardiomediastinal silhouette is stable. There is a moderate size right pleural effusion which appears increased in size since 04/13/2021. There is worsening aeration of the right lower lobe. The right upper lung remains aerated. The previously seen right pneumothorax is not appreciated. Aeration of the left base appears improved. There is no new or worsening airspace disease on the left. There is no significant left effusion. There is no left pneumothorax. There is no acute osseous abnormality. IMPRESSION: 1. Increased size of the moderate-sized right pleural effusion with worsening aeration of the right lung. 2. No definite residual right pneumothorax. 3. Slightly improved aeration of the left base. Electronically Signed   By: Valetta Mole M.D.   On: 04/17/2021 13:03    Scheduled Meds:   amiodarone  200 mg Oral Daily   busPIRone  7.5 mg Oral BID   chlorhexidine  15 mL Mouth Rinse BID   fluticasone furoate-vilanterol  1 puff Inhalation Daily   insulin aspart  0-15 Units Subcutaneous TID WC   insulin glargine-yfgn  10 Units Subcutaneous Daily   ipratropium  2 spray Each Nare BID  levalbuterol  0.63 mg Nebulization TID   magnesium oxide  400 mg Oral Daily   mouth rinse  15 mL Mouth Rinse q12n4p   midodrine  10 mg Oral TID WC   pantoprazole  40 mg Oral BID   polyethylene glycol  17 g Oral Daily   Ensure Max Protein  11 oz Oral BID   sildenafil  20 mg Oral TID   torsemide  20 mg Oral BID   umeclidinium bromide  1 puff Inhalation Daily   Continuous Infusions:  sodium chloride Stopped (04/06/21 1445)   heparin 1,350 Units/hr (04/18/21 1429)    LOS: 15 days   Kerney Elbe, DO [facility] PAGER is on AMION  If 7PM-7AM, please contact night-coverage [email or web address]

## 2021-04-18 NOTE — Progress Notes (Signed)
ANTICOAGULATION CONSULT NOTE Pharmacy Consult for heparin Indication: atrial fibrillation  Allergies  Allergen Reactions   Novocain [Procaine] Other (See Comments)    Unknown reaction as a young child    Patient Measurements: Height: 5' 7"  (170.2 cm) Weight: 68.8 kg (151 lb 10.8 oz) IBW/kg (Calculated) : 66.1 Heparin Dosing Weight: 68kg  Vital Signs: Temp: 98.4 F (36.9 C) (12/01 2035) Temp Source: Oral (12/01 2035) BP: 101/59 (12/01 2049) Pulse Rate: 73 (12/01 2035)  Labs: Recent Labs    04/17/21 0235 04/17/21 2235 04/18/21 0237 04/18/21 1314 04/18/21 2239  HGB 7.3*  --  7.2*  --   --   HCT 24.9*  --  25.0*  --   --   PLT 432*  --  465*  --   --   APTT  --  42*  --  43* 51*  HEPARINUNFRC  --   --   --  >1.10*  --   CREATININE 0.86  --  0.76  --   --      Estimated Creatinine Clearance: 78 mL/min (by C-G formula based on SCr of 0.76 mg/dL).  Assessment: 70 yo male with h/o Afib, Eliquis on hold, for heparin   Goal of Therapy:  Heparin level 0.3-0.7 units/ml aPTT 66-102 seconds Monitor platelets by anticoagulation protocol: Yes   Plan:  Increase heparin 1500 units/hr Follow-up am labs.   Phillis Knack, PharmD, BCPS 04/18/2021 11:33 PM

## 2021-04-18 NOTE — Progress Notes (Signed)
NAME:  Gary [name], MRN:  [id number], DOB:  12/14/1950, LOS: 8 ADMISSION DATE:  04/05/2021, CONSULTATION DATE:  03/22/2021 REFERRING MD:  Dr. Karle Starch, ER, CHIEF COMPLAINT:  Short of breath   History of Present Illness:  70 yo male former smoker presented to [facility] ER with dyspnea and cough.  He has COPD and uses 6 liters oxygen.  He was to have thoracentesis for right pleural effusion by Dr. Valeta Harms on 04/04/21.  He had eliquis held in anticipation of this.  First noted to have pleural effusions in April 2022.  He was started on sildenafil for pulmonary hypertension during hospitalization in May 2022.  He had right thoracentesis done in May and July of 2022.  In ER he was started on supplemental oxygen and Bipap.  He was found to have anemia with hemoglobin 7.  He had EGD in April 2022 that showed large duodenal ulcer, short segments of Barrett's esophagus, and erosive gastritis.  He reports his symptoms started on 03/30/21.  He isn't aware of having blood in his stool or melena.  He denies chest pain or fever.  Pertinent  Medical History  A fib, DM type 2, HLD, HTN, Pulmonary hypertension, Barrett's esophagus, CAD, PUD with upper GI bleeding, COPD, +ANA, Pneumococcal pneumonia April 2022  Studies:  Rt thoracentesis 09/18/20 >> 1200 ml fluid, glucose 220, protein < 3, LDH 139, 790 cells (92% L), cytology negative Rt thoracentesis 12/04/20 >> glucose 174, protein 4, LDH 124, 810 cells (93% L), cytology negative Echo 12/04/20 >> EF 55 to 60%, mod LVH, severe RV systolic dysfx, mod RV enlargement, RVSP 59.5 mmHg, mod LA and RA dilation CT chest 12/31/20 >> atherosclerosis, 2.4 cm subcarinal LN, 2.3 cm Rt hilar LN, 2 cm precarinal LN, multiloculated Rt pleural effusion Postthoracentesis for 1500 cc  11/30 O2 requirement around 7-10 L O2  Interim History / Subjective:   Feels OK Says that his breathing was helped most by the thoracentesis Slept OK, sleeps in a reclined position (HOB ~70  degrees)  Objective   Blood pressure 113/64, pulse 71, temperature 98.3 F (36.8 C), temperature source Oral, resp. rate 20, height 5' 7"  (1.702 m), weight 68.8 kg, SpO2 99 %.    FiO2 (%):  [60 %] 60 %   Intake/Output Summary (Last 24 hours) at 04/18/2021 0955 Last data filed at 04/18/2021 0802 Gross per 24 hour  Intake 1188.88 ml  Output 1650 ml  Net -461.12 ml   Filed Weights   04/09/2021 1529 04/04/21 0414 04/04/21 1137  Weight: 68.4 kg 70.2 kg 68.8 kg    Examination:  General:  Cachectic, sitting up in chair, conversant HENT: NCAT OP clear, significant oral secretions PULM: Diminished R base B, normal effort CV: irreg irreg, no mgr GI: BS+, soft, nontender MSK: diminished bulk and tone Neuro: awake, alert, no distress, MAEW   Resolved Hospital Problem list     Assessment & Plan:  Gary [name] is a 70 y.o. man with:  Acute on chronic respiratory failure Underlying chronic obstructive pulmonary disease Stage IV non-small cell lung cancer Loculated hydropneumothorax Pneumothorax ex vacuo s/p thoracentesis Malignant pleural effusion Pulmonary hypertension Atrial fibrillation Severe physical deconditioning, sarcopenia cachexia  Discussion: Sadly, Gary [name] is dying from progressive changes from his stage IV cancer.  He is severely deconditioned, cachectic compared to earlier this year, has progressive infiltrates in his lungs and recurrent malignant pleural effusion causing generally worsening oxygenation over the last several weeks.  He is not a candidate for cancer treatment.  I had a lengthy conversation with Gary [name] today.   We discussed the fact that a pleurx catheter may bring some symptom relief but would not change his overall prognosis.  Further, the risk of it is not trivial.  They understand and are supportive of the best option is hospice, though they would like to pursue another thoracentesis to see if it will provide some symptom relief.     Plan: No plan for pleurx Continue bronchodilators (Breo, atrovent/xopenex) Thoracentesis on 12/2 with Gary [name] Continue oxygen titrated to SaO2 > 88% Out of bed as much as possible  Consider robinul for oral secretions Have discussed with palliative medicine> they will help address whether or not an inpatient hospice facility could be an option for Gary [name]. Goal of care is comfort    Labs   CBC: Recent Labs  Lab 04/14/21 0226 04/17/21 0235 04/18/21 0237  WBC 8.7 9.1 10.0  NEUTROABS  --   --  8.0*  HGB 8.6* 7.3* 7.2*  HCT 28.2* 24.9* 25.0*  MCV 80.6 82.2 82.0  PLT 462* 432* 465*    Basic Metabolic Panel: Recent Labs  Lab 04/14/21 0226 04/15/21 0216 04/17/21 0235 04/18/21 0237  NA 137 134* 132* 134*  K 3.3* 4.0 3.6 3.6  CL 90* 88* 89* 89*  CO2 40* 40* 37* 37*  GLUCOSE 99 167* 202* 147*  BUN 19 23 26* 24*  CREATININE 0.84 0.89 0.86 0.76  CALCIUM 7.8* 8.0* 8.0* 8.1*  MG 1.5* 2.3  --  1.8  PHOS  --   --   --  4.2   GFR: Estimated Creatinine Clearance: 78 mL/min (by C-G formula based on SCr of 0.76 mg/dL). Recent Labs  Lab 04/14/21 0226 04/17/21 0235 04/18/21 0237  WBC 8.7 9.1 10.0    Liver Function Tests: Recent Labs  Lab 04/18/21 0237  AST 67*  ALT 81*  ALKPHOS 83  BILITOT 0.5  PROT 5.8*  ALBUMIN 1.8*   No results for input(s): LIPASE, AMYLASE in the last 168 hours. No results for input(s): AMMONIA in the last 168 hours.  ABG    Component Value Date/Time   PHART 7.484 (H) 04/14/2021 1003   PCO2ART 44.0 04/14/2021 1003   PO2ART 59.0 (L) 03/20/2021 1003   HCO3 32.4 (H) 03/22/2021 1003   TCO2 33 (H) 09/24/2020 1516   ACIDBASEDEF 4.0 (H) 09/07/2020 2236   O2SAT 89.3 04/08/2021 1003     Coagulation Profile: No results for input(s): INR, PROTIME in the last 168 hours.   Cardiac Enzymes: No results for input(s): CKTOTAL, CKMB, CKMBINDEX, TROPONINI in the last 168 hours.  HbA1C: Hgb A1c MFr Bld  Date/Time Value Ref Range Status   03/24/2021 09:58 AM 6.9 (H) 4.8 - 5.6 % Final    Comment:    (NOTE) Pre diabetes:          5.7%-6.4%  Diabetes:              >6.4%  Glycemic control for   <7.0% adults with diabetes   01/11/2021 02:54 AM 7.4 (H) 4.8 - 5.6 % Final    Comment:    (NOTE) Pre diabetes:          5.7%-6.4%  Diabetes:              >6.4%  Glycemic control for   <7.0% adults with diabetes     CBG: Recent Labs  Lab 04/17/21 0720 04/17/21 1124 04/17/21 1541 04/17/21 2126 04/18/21 0733  GLUCAP 195* 187* 207* 136* 122*  Roselie Awkward, MD [facility] Pager: [phone] Cell: [phone] After 7:00 pm call Elink  [phone]  > 35 minutes spent on this visit discussing with other providers and meeting with the patient and his wife.

## 2021-04-18 NOTE — Progress Notes (Signed)
Daily Progress Note   Patient Name: Gary [name]       Date: 04/18/2021 DOB: [birth year]  Age: 70 y.o. MRN#: [id number] Attending Physician: Kerney Elbe, DO Primary Care Physician: Riley Lam Admit Date: 04/07/2021  Reason for Consultation/Follow-up: Establishing goals of care  Patient Profile/HPI: 70 y.o. male  with past medical history of a. Fib, pulmonary hypertension, CHF- cor pulmonale (R ventricular failure), DM2, hypothyroidism, anxiety with associated panic attacks, HLD, HTN, GERD, R lung mass with associated lymphadenopathy (have not been able to biopsy), recurrent pleural effusions s/p several thoracentises, chronic respiratory failure on home oxygen,  admitted on 03/24/2021 with worsening shortness of breath. Palliative medicine consulted for goals of care discussion in light of significant lung disease that continues to worsen.  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   Subjective: Gary [name] is sitting up in his chair, asleep, but arouses easily. He reports not feeling great today. He tells me "I'm not getting better". He is agreeable to trying a dose of morphine for symptom help. He wants to go home but states he will do whatever needs to be done when asked about possible hospice house.  Called [name]. Discussed option of Gary [name] vs home with hospice.  She wishes to try and take Gary [name] home as that is what he wants.   Review of Systems  Constitutional:  Positive for malaise/fatigue and weight loss.  Respiratory:  Positive for shortness of breath.     Physical Exam Vitals and nursing note reviewed.  Constitutional:      Comments: Frail, arouses easily  Pulmonary:     Comments: Appears SOB, but does not report subjective  SOB Skin:    Coloration: Skin is pale.  Neurological:     Mental Status: He is oriented to person, place, and time.            Vital Signs: BP 113/64 (BP Location: Right Arm)   Pulse 71   Temp 98.3 F (36.8 C) (Oral)   Resp 20   Ht 5' 7"  (1.702 m)   Wt 68.8 kg   SpO2 99%   BMI 23.76 kg/m  SpO2: SpO2: 99 % O2 Device: O2 Device: High Flow Nasal Cannula ([place]) O2 Flow Rate: O2 Flow Rate (L/min): 15 L/min  Intake/output summary:  Intake/Output Summary (Last 24 hours) at 04/18/2021 1041 Last data filed at 04/18/2021 0802 Gross per 24 hour  Intake 1188.88 ml  Output 1650 ml  Net -461.12 ml    LBM: Last BM Date: 04/17/21 Baseline Weight: Weight: 76.9 kg Most recent weight: Weight: 68.8 kg       Palliative Assessment/Data: PPS: 30%        Palliative Care Assessment & Plan    Assessment/Recommendations/Plan  Refer to [facility] Morphine and lorazepam as previously ordered Will add robinul prn, d/c lipitor, ferrous sulfate and multivitamin- will keep other medications in place for now Thoracentesis tomorrow per PCCM for symptom control   Code Status: DNR  Prognosis:  < 4 weeks likely due to malignant pleural effusion with plans to d/c home with hospice and comfort measures  Discharge Planning: To Be Determined  Care plan was discussed with care team, patient, and patient's spouse- .   Thank you for allowing the Palliative Medicine Team to assist in the care of this patient.  Total time: 45 mins Prolonged billing: No     Greater than 50%  of this time was spent counseling and coordinating care related to the above assessment and plan.  Mariana Kaufman, AGNP-C Palliative Medicine   Please contact Palliative Medicine Team phone at [phone] for questions and concerns.
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
## 2021-04-18 NOTE — TOC Progression Note (Addendum)
Transition of Care [facility]) - Progression Note    Patient Details  Name: Gary [name] MRN: [id number] Date of Birth: [birth year]  Transition of Care [facility]) CM/SW [place], RN Phone Number: 04/18/2021, 12:25 PM  Clinical Narrative:     Spoke to Gary [name]. She states that they have DME a shower stool, walker, walker with seat, overbed table,  BSC. She will need a hospital bed. She gave permission to fax over to [place]. Faxed H&P progress note and palliative consult to [phone]. Call back number given with faxes.  Currently have home oxygen at  8LPM and bipap. , along with the DME, through [employer]. Wife works for [employer], and was checking to see if they can take him home when ready to DC.Marland Kitchen  CM will continue to follow and update as [facility] follows up.   1530 Have not heard from [facility] called and left a message for them to Cb this RNCM for acceptance 1534 received a call from [place] at [facility], they will order bed and send to home via [employer]. Wife is looking  for a Monday DC. But maybe sooner if DME is delivered over weekend  1622 spoke again with [facility]. Because patient is on a trilogy at home, they cannot accept him for home hospice, he is also above the threshold they can take for oxygen at this time. If these are to continue, we will have to look at different hospice options.  Spoke to wife Gary [name], who would like [place] to speak to [facility]. Sent message to hospital liaison Ms. Edison Pace.  Expected Discharge Plan: Home w Hospice Care Barriers to Discharge: Continued Medical Work up  Expected Discharge Plan and Services Expected Discharge Plan: [place]   Discharge Planning Services: CM Consult Post Acute Care Choice: Hospice Living arrangements for the past 2 months: Single Family Home                   DME Agency: NA                   Social Determinants  of Health (SDOH) Interventions    Readmission Risk Interventions Readmission Risk Prevention Plan 04/08/2021 12/10/2020  Transportation Screening Complete Complete  Medication Review Press photographer) Complete Referral to Pharmacy  PCP or Specialist appointment within 3-5 days of discharge Complete Complete  HRI or [place] Complete Complete  SW Recovery Care/Counseling Consult Complete Complete  Palliative Care Screening Complete Not [place] Not Applicable Not Applicable  Some recent data might be hidden

## 2021-04-18 DEATH — deceased

## 2021-04-19 ENCOUNTER — Encounter: Payer: Self-pay | Admitting: Pulmonary Disease

## 2021-04-19 DIAGNOSIS — Z515 Encounter for palliative care: Secondary | ICD-10-CM | POA: Diagnosis not present

## 2021-04-19 DIAGNOSIS — J90 Pleural effusion, not elsewhere classified: Secondary | ICD-10-CM | POA: Diagnosis not present

## 2021-04-19 DIAGNOSIS — J9621 Acute and chronic respiratory failure with hypoxia: Secondary | ICD-10-CM | POA: Diagnosis not present

## 2021-04-19 DIAGNOSIS — J9622 Acute and chronic respiratory failure with hypercapnia: Secondary | ICD-10-CM | POA: Diagnosis not present

## 2021-04-19 DIAGNOSIS — I5033 Acute on chronic diastolic (congestive) heart failure: Secondary | ICD-10-CM | POA: Diagnosis not present

## 2021-04-19 DIAGNOSIS — I482 Chronic atrial fibrillation, unspecified: Secondary | ICD-10-CM | POA: Diagnosis not present

## 2021-04-19 DIAGNOSIS — J910 Malignant pleural effusion: Secondary | ICD-10-CM | POA: Diagnosis not present

## 2021-04-19 LAB — CBC WITH DIFFERENTIAL/PLATELET
Abs Immature Granulocytes: 0.09 10*3/uL — ABNORMAL HIGH (ref 0.00–0.07)
Basophils Absolute: 0 10*3/uL (ref 0.0–0.1)
Basophils Relative: 0 %
Eosinophils Absolute: 0.1 10*3/uL (ref 0.0–0.5)
Eosinophils Relative: 1 %
HCT: 25.5 % — ABNORMAL LOW (ref 39.0–52.0)
Hemoglobin: 7.4 g/dL — ABNORMAL LOW (ref 13.0–17.0)
Immature Granulocytes: 1 %
Lymphocytes Relative: 7 %
Lymphs Abs: 0.7 10*3/uL (ref 0.7–4.0)
MCH: 23.9 pg — ABNORMAL LOW (ref 26.0–34.0)
MCHC: 29 g/dL — ABNORMAL LOW (ref 30.0–36.0)
MCV: 82.5 fL (ref 80.0–100.0)
Monocytes Absolute: 1.1 10*3/uL — ABNORMAL HIGH (ref 0.1–1.0)
Monocytes Relative: 10 %
Neutro Abs: 9 10*3/uL — ABNORMAL HIGH (ref 1.7–7.7)
Neutrophils Relative %: 81 %
Platelets: 466 10*3/uL — ABNORMAL HIGH (ref 150–400)
RBC: 3.09 MIL/uL — ABNORMAL LOW (ref 4.22–5.81)
RDW: 19.8 % — ABNORMAL HIGH (ref 11.5–15.5)
WBC: 11.1 10*3/uL — ABNORMAL HIGH (ref 4.0–10.5)
nRBC: 0 % (ref 0.0–0.2)

## 2021-04-19 LAB — BLOOD GAS, ARTERIAL
Acid-Base Excess: 13.4 mmol/L — ABNORMAL HIGH (ref 0.0–2.0)
Bicarbonate: 39.7 mmol/L — ABNORMAL HIGH (ref 20.0–28.0)
FIO2: 68
O2 Saturation: 92 %
Patient temperature: 37
pCO2 arterial: 75.9 mmHg (ref 32.0–48.0)
pH, Arterial: 7.338 — ABNORMAL LOW (ref 7.350–7.450)
pO2, Arterial: 66.6 mmHg — ABNORMAL LOW (ref 83.0–108.0)

## 2021-04-19 LAB — GLUCOSE, CAPILLARY
Glucose-Capillary: 127 mg/dL — ABNORMAL HIGH (ref 70–99)
Glucose-Capillary: 145 mg/dL — ABNORMAL HIGH (ref 70–99)

## 2021-04-19 LAB — COMPREHENSIVE METABOLIC PANEL
ALT: 72 U/L — ABNORMAL HIGH (ref 0–44)
AST: 54 U/L — ABNORMAL HIGH (ref 15–41)
Albumin: 1.8 g/dL — ABNORMAL LOW (ref 3.5–5.0)
Alkaline Phosphatase: 84 U/L (ref 38–126)
Anion gap: 8 (ref 5–15)
BUN: 27 mg/dL — ABNORMAL HIGH (ref 8–23)
CO2: 35 mmol/L — ABNORMAL HIGH (ref 22–32)
Calcium: 8 mg/dL — ABNORMAL LOW (ref 8.9–10.3)
Chloride: 90 mmol/L — ABNORMAL LOW (ref 98–111)
Creatinine, Ser: 0.72 mg/dL (ref 0.61–1.24)
GFR, Estimated: >60 mL/min (ref >60)
Glucose, Bld: 194 mg/dL — ABNORMAL HIGH (ref 70–99)
Potassium: 3.5 mmol/L (ref 3.5–5.1)
Sodium: 133 mmol/L — ABNORMAL LOW (ref 135–145)
Total Bilirubin: 0.4 mg/dL (ref 0.3–1.2)
Total Protein: 6.1 g/dL — ABNORMAL LOW (ref 6.5–8.1)

## 2021-04-19 LAB — APTT: aPTT: 43 seconds — ABNORMAL HIGH (ref 24–36)

## 2021-04-19 LAB — MAGNESIUM: Magnesium: 1.7 mg/dL (ref 1.7–2.4)

## 2021-04-19 LAB — HEPARIN LEVEL (UNFRACTIONATED): Heparin Unfractionated: 0.99 IU/mL — ABNORMAL HIGH (ref 0.30–0.70)

## 2021-04-19 LAB — PHOSPHORUS: Phosphorus: 4 mg/dL (ref 2.5–4.6)

## 2021-04-19 MED ORDER — POLYVINYL ALCOHOL 1.4 % OP SOLN
1.0000 [drp] | Freq: Four times a day (QID) | OPHTHALMIC | Status: DC | PRN
  Filled 2021-04-19: qty 15

## 2021-04-19 MED ORDER — HALOPERIDOL LACTATE 5 MG/ML IJ SOLN: 0.5000 mg | INTRAMUSCULAR | Status: DC | PRN

## 2021-04-19 MED ORDER — MORPHINE 100MG IN NS 100ML (1MG/ML) PREMIX INFUSION
2.0000 mg/h | INTRAVENOUS | Status: DC
  Administered 2021-04-19: 2 mg/h via INTRAVENOUS
  Filled 2021-04-19: qty 100

## 2021-04-19 MED ORDER — BIOTENE DRY MOUTH MT LIQD: 15.0000 mL | OROMUCOSAL | Status: DC | PRN

## 2021-04-19 MED ORDER — HALOPERIDOL 0.5 MG PO TABS
0.5000 mg | ORAL_TABLET | ORAL | Status: DC | PRN
  Filled 2021-04-19: qty 1

## 2021-04-19 MED ORDER — LORAZEPAM 2 MG/ML IJ SOLN: 1.0000 mg | Freq: Four times a day (QID) | INTRAMUSCULAR | Status: DC

## 2021-04-19 MED ORDER — MORPHINE SULFATE (PF) 2 MG/ML IV SOLN
2.0000 mg | INTRAVENOUS | Status: AC
  Administered 2021-04-19: 2 mg via INTRAVENOUS
  Filled 2021-04-19: qty 1

## 2021-04-19 MED ORDER — HALOPERIDOL LACTATE 2 MG/ML PO CONC
0.5000 mg | ORAL | Status: DC | PRN
  Filled 2021-04-19: qty 0.3

## 2021-04-19 MED ORDER — MORPHINE BOLUS VIA INFUSION
2.0000 mg | INTRAVENOUS | Status: DC | PRN
  Administered 2021-04-19 – 2021-04-20 (×2): 2 mg via INTRAVENOUS
  Filled 2021-04-19: qty 2

## 2021-04-19 MED ORDER — MORPHINE SULFATE (PF) 2 MG/ML IV SOLN: 2.0000 mg | INTRAVENOUS | Status: DC | PRN

## 2021-04-19 NOTE — Telephone Encounter (Signed)
BI please advise. Thanks

## 2021-04-19 NOTE — Progress Notes (Addendum)
Palliative-   Noted oxygen limitations of [facility]. I have reached out to [facility] for some clarifications.  Additionally, have contacted [facility] - they are able to do up to 25L oxygen at [place] at home, and will typically accept patients with their own trilogy machine.  I plan to meet [name] today to discuss options and to evaluate if [name] is stable enough for transfer out of facility.   Mariana Kaufman, AGNP-C Palliative Medicine  No charge note- full progress note to follow

## 2021-04-19 NOTE — Progress Notes (Signed)
Daily Progress Note   Patient Name: Gary [name]       Date: 04/19/2021 DOB: December 09, 1950  Age: 70 y.o. MRN#: [id number] Attending Physician: Kerney Elbe, DO Primary Care Physician: Riley Lam Admit Date: 04/01/2021  Reason for Consultation/Follow-up: Establishing goals of care  Patient Profile/HPI: 70 y.o. male  with past medical history of a. Fib, pulmonary hypertension, CHF- cor pulmonale (R ventricular failure), DM2, hypothyroidism, anxiety with associated panic attacks, HLD, HTN, GERD, R lung mass with associated lymphadenopathy (have not been able to biopsy), recurrent pleural effusions s/p several thoracentises, chronic respiratory failure on home oxygen,  admitted on 04/13/2021 with worsening shortness of breath. Palliative medicine consulted for goals of care discussion in light of significant lung disease that continues to worsen.  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   Subjective: [name] has had significant worsening since yesterday. He is visibly short of breath, desats with any movement. He is lethargic, talking minimally, not eating or drinking. Met with his spouse, [name] and daughter, Chalker.  [name] reports that she had good discussion with him yesterday and he let her know that he was ready for end of life.  All are in agreement for transition to full comfort measures only. Stop labs, medications and interventions that are not contributing to his trajectory or comfort. Do not increase oxygen. He will be best served with morphine infusion.    Review of Systems  Constitutional:  Positive for malaise/fatigue.  Respiratory:  Positive for shortness of breath.     Physical Exam Vitals and nursing note reviewed.   Constitutional:      Appearance: He is ill-appearing.     Comments: Frail  Pulmonary:     Effort: Respiratory distress present.     Comments: RR and effort increased Skin:    Coloration: Skin is pale.  Neurological:     Comments: Minimally responsive, able to follow some commands            Vital Signs: BP 122/61 (BP Location: Right Arm)   Pulse 68   Temp 97.8 F (36.6 C) (Axillary)   Resp 19   Ht 5' 7"  (1.702 m)   Wt 68.8 kg   SpO2 96%   BMI 23.76 kg/m  SpO2: SpO2: 96 % O2 Device: O2 Device:  Bi-PAP O2 Flow Rate: O2 Flow Rate (L/min): 13 L/min  Intake/output summary:  Intake/Output Summary (Last 24 hours) at 04/19/2021 1101 Last data filed at 04/19/2021 0920 Gross per 24 hour  Intake 522.64 ml  Output 850 ml  Net -327.36 ml    LBM: Last BM Date: 04/17/21 Baseline Weight: Weight: 76.9 kg Most recent weight: Weight: 68.8 kg       Palliative Assessment/Data: PPS: 10%        Palliative Care Assessment & Plan    Assessment/Recommendations/Plan  Transition to full comfort measures only D/C labs, meds, interventions that are not affecting trajectory or comfort Do not titrate oxygen- use opioid for SOB Morphine infusion 74m/hr with 271mbolus [id number]- can increase basal rate if frequent bolus doses are needed Given history of anxiety and panic- will schedule lorazepam 1mg23mhrs IV as he likely won't be able to take po buspar Recommend remain inpatient for now, he is likely to rapidly decline and isn't stable for transfer out of facility  Code Status: DNR  Prognosis:  Hours - Days likely due to malignant pleural effusion with plans for comfort measures only  Discharge Planning: Anticipated Hospital Death  Care plan was discussed with care team, patient, and patient's spouse and daughter.   Thank you for allowing the Palliative Medicine Team to assist in the care of this patient.  Total time: 65 mins      Greater than 50%  of this time was spent  counseling and coordinating care related to the above assessment and plan.  KasiMariana KaufmanNP-C Palliative Medicine   Please contact Palliative Medicine Team phone at [phone] questions and concerns.

## 2021-04-19 NOTE — Progress Notes (Signed)
Medications held because of lethargy. Notified all providers, Dr. Alfredia Ferguson, Dr. Lake Bells and Mariana Kaufman NP. Patient too lethargic to safely take medication. Palliative into see patient and wife. Orders given.  Morphine held until patient saw his grandchildren, then given bolus 2 mg. Morphine infusion started with a 2 mg bolus, respirations in the 30's before second bolus. Resting comfortably after infusion started, resp rate in the 20's, checked frequently without change.

## 2021-04-19 NOTE — Progress Notes (Signed)
NAME:  Gary [name], MRN:  [id number], DOB:  04/07/1951, LOS: 36 ADMISSION DATE:  04/06/2021, CONSULTATION DATE:  04/16/2021 REFERRING MD:  Dr. Karle Starch, ER, CHIEF COMPLAINT:  Short of breath   History of Present Illness:  70 yo male former smoker presented to [facility] ER with dyspnea and cough.  He has COPD and uses 6 liters oxygen.  He was to have thoracentesis for right pleural effusion by Dr. Valeta Harms on 04/04/21.  He had eliquis held in anticipation of this.  First noted to have pleural effusions in April 2022.  He was started on sildenafil for pulmonary hypertension during hospitalization in May 2022.  He had right thoracentesis done in May and July of 2022.  In ER he was started on supplemental oxygen and Bipap.  He was found to have anemia with hemoglobin 7.  He had EGD in April 2022 that showed large duodenal ulcer, short segments of Barrett's esophagus, and erosive gastritis.  He reports his symptoms started on 03/30/21.  He isn't aware of having blood in his stool or melena.  He denies chest pain or fever.  Pertinent  Medical History  A fib, DM type 2, HLD, HTN, Pulmonary hypertension, Barrett's esophagus, CAD, PUD with upper GI bleeding, COPD, +ANA, Pneumococcal pneumonia April 2022  Studies:  Rt thoracentesis 09/18/20 >> 1200 ml fluid, glucose 220, protein < 3, LDH 139, 790 cells (92% L), cytology negative Rt thoracentesis 12/04/20 >> glucose 174, protein 4, LDH 124, 810 cells (93% L), cytology negative Echo 12/04/20 >> EF 55 to 60%, mod LVH, severe RV systolic dysfx, mod RV enlargement, RVSP 59.5 mmHg, mod LA and RA dilation CT chest 12/31/20 >> atherosclerosis, 2.4 cm subcarinal LN, 2.3 cm Rt hilar LN, 2 cm precarinal LN, multiloculated Rt pleural effusion Postthoracentesis for 1500 cc  11/30 O2 requirement around 7-10 L O2  Interim History / Subjective:   Confused this morning By report was nearly unresponsive He is able to answer a few questions for me.  When I ask how is his  breathing he says, "I'm breathing"   Objective   Blood pressure 122/61, pulse 68, temperature 97.8 F (36.6 C), temperature source Axillary, resp. rate 19, height 5' 7"  (1.702 m), weight 68.8 kg, SpO2 96 %.        Intake/Output Summary (Last 24 hours) at 04/19/2021 0934 Last data filed at 04/19/2021 0920 Gross per 24 hour  Intake 522.64 ml  Output 850 ml  Net -327.36 ml   Filed Weights   04/09/2021 1529 04/04/21 0414 04/04/21 1137  Weight: 68.4 kg 70.2 kg 68.8 kg    Examination:  General:  Sitting up in bed, drowsy but will awaken to voice HENT: NCAT OP clear PULM: Diminished R lung, clear on left CV: Irreg irreg, no mgr GI: BS+, soft, nontender MSK: normal bulk and tone Neuro: drowsy, wakes to voice    Resolved Hospital Problem list     Assessment & Plan:  Gary [name] is a 70 y.o. man with:  Acute on chronic respiratory failure Underlying chronic obstructive pulmonary disease Stage IV non-small cell lung cancer Loculated hydropneumothorax Pneumothorax ex vacuo s/p thoracentesis Malignant pleural effusion Pulmonary hypertension Atrial fibrillation Severe physical deconditioning, sarcopenia Cachexia Acute metabolic encephalopathy > new problem 12/2  Discussion: Gary [name] is much worse today.  He has encephalopathy possibly due to hypercarbia vs morphine.   I am concerned about his overall condition and may need to transition to full comfort measures today if he does not improve.  Currently he is not able to cooperate for a thoracentesis as he would need to sit up and support himself in order for [place] to perform that safely.  Plan: Check ABG stat If hypercarbic, go back on NIMV Monitor mental status today> need to balance morphine for dyspnea vs confusion If no improvement in mental status as the day goes on and if he remains dyspneic will need to transition to full comfort measures If mental status improves will perform thoracentesis D/c heparin  Discussed  with Dr. Alfredia Ferguson and bedside nurse   Labs   CBC: Recent Labs  Lab 04/14/21 0226 04/17/21 0235 04/18/21 0237 04/19/21 0147  WBC 8.7 9.1 10.0 11.1*  NEUTROABS  --   --  8.0* 9.0*  HGB 8.6* 7.3* 7.2* 7.4*  HCT 28.2* 24.9* 25.0* 25.5*  MCV 80.6 82.2 82.0 82.5  PLT 462* 432* 465* 466*    Basic Metabolic Panel: Recent Labs  Lab 04/14/21 0226 04/15/21 0216 04/17/21 0235 04/18/21 0237 04/19/21 0147  NA 137 134* 132* 134* 133*  K 3.3* 4.0 3.6 3.6 3.5  CL 90* 88* 89* 89* 90*  CO2 40* 40* 37* 37* 35*  GLUCOSE 99 167* 202* 147* 194*  BUN 19 23 26* 24* 27*  CREATININE 0.84 0.89 0.86 0.76 0.72  CALCIUM 7.8* 8.0* 8.0* 8.1* 8.0*  MG 1.5* 2.3  --  1.8 1.7  PHOS  --   --   --  4.2 4.0   GFR: Estimated Creatinine Clearance: 78 mL/min (by C-G formula based on SCr of 0.72 mg/dL). Recent Labs  Lab 04/14/21 0226 04/17/21 0235 04/18/21 0237 04/19/21 0147  WBC 8.7 9.1 10.0 11.1*    Liver Function Tests: Recent Labs  Lab 04/18/21 0237 04/19/21 0147  AST 67* 54*  ALT 81* 72*  ALKPHOS 83 84  BILITOT 0.5 0.4  PROT 5.8* 6.1*  ALBUMIN 1.8* 1.8*   No results for input(s): LIPASE, AMYLASE in the last 168 hours. No results for input(s): AMMONIA in the last 168 hours.  ABG    Component Value Date/Time   PHART 7.484 (H) 03/21/2021 1003   PCO2ART 44.0 04/11/2021 1003   PO2ART 59.0 (L) 03/26/2021 1003   HCO3 32.4 (H) 03/25/2021 1003   TCO2 33 (H) 09/24/2020 1516   ACIDBASEDEF 4.0 (H) 09/07/2020 2236   O2SAT 89.3 03/19/2021 1003     Coagulation Profile: No results for input(s): INR, PROTIME in the last 168 hours.   Cardiac Enzymes: No results for input(s): CKTOTAL, CKMB, CKMBINDEX, TROPONINI in the last 168 hours.  HbA1C: Hgb A1c MFr Bld  Date/Time Value Ref Range Status  03/24/2021 09:58 AM 6.9 (H) 4.8 - 5.6 % Final    Comment:    (NOTE) Pre diabetes:          5.7%-6.4%  Diabetes:              >6.4%  Glycemic control for   <7.0% adults with diabetes    01/11/2021 02:54 AM 7.4 (H) 4.8 - 5.6 % Final    Comment:    (NOTE) Pre diabetes:          5.7%-6.4%  Diabetes:              >6.4%  Glycemic control for   <7.0% adults with diabetes     CBG: Recent Labs  Lab 04/18/21 0733 04/18/21 1152 04/18/21 1603 04/18/21 2128 04/19/21 0731  GLUCAP 122* 227* 133* 155* 127*   Roselie Awkward, MD [place] PCCM Pager: [phone] Cell: [phone] After  7:00 pm call Elink  [phone]

## 2021-04-19 NOTE — Care Management Important Message (Signed)
Important Message  Patient Details  Name: Gary [name] MRN: [id number] Date of Birth: 20-Apr-1951   Medicare Important Message Given:  Yes     Shelda Altes 04/19/2021, 9:52 AM

## 2021-04-19 NOTE — Progress Notes (Signed)
[facility]) Hospital Liaison Note   MSW made contact with spouse/[name] via TC with MPT NP/Kasie. Per Leonard Downing, [facility] is to 'pause' referral until dispo. plan can be finalized as there are discussions of keeping patient hospitalized. [place] team updated. [facility] to remain available PRN.   Please call with any questions/concerns.    Thank you for the opportunity to participate in this patient's care.   Daphene Calamity, MSW [facility] Liaison  [phone]

## 2021-04-19 NOTE — Progress Notes (Signed)
PROGRESS NOTE    Gary [name]  SWF:[id number] DOB: May 15, 1951 DOA: 03/28/2021 PCP: Jalene Mullet, PA-C  Brief Narrative:  The patient is a 70 year old Caucasian male who is a retired Arts administrator with a past medical history significant for but not limited to history of chronic hypoxic respiratory failure, chronic history of heart failure with preserved ejection fraction, pulm hypertension, type 2 diabetes mellitus, history of progressive lung mass with bronchoscopy being declined by the patient who previously presented to [facility] 04/04/2021 with respiratory distress, hypoxia, hypotension requiring noninvasive positive pressure ventilation pressure support.  Initial thoracentesis of loculated pleural effusion was unsuccessful and he was transferred to the ICU at [facility] for a 2 L of blood-tinged fluid that was drained the right thoracentesis on 04/04/2021.  His respiratory distress continued despite diuretic antibiotics after his thoracentesis.  Repeat thoracentesis was performed on 04/04/2021 with 1.5 L of complicated pneumothorax ex vacuo.  His FiO2 requirements have come down some but he required significant mount oxygen and was on 10 L.  Today he is on 7 L and palliative continues to follow.    Pulmonary evaluated yesterday and felt that he would benefit from a Pleurx catheter.  It was not previously performed given his severe orthopnea but now the plan is for Pleurx placement on 04/22/2021 in the afternoon in the Endo suite.  He was anticoagulated with apixaban and this will need to be washed out so his oral anticoagulation was held and he was initiated on heparin drip which will need to be stopped Sunday p.m. however after further goals of care discussion with the pulmonologist today Dr. Lake Bells discussed the fact the Pleurx catheter may bring some symptom relief but would not change his overall prognosis and that the risk of placing it was nontrivial.  The patient and his family ultimately  decided that best supportive option would be hospice and they would pursue another thoracentesis to see if it would provide symptom relief.  Plan is now to cancel the Pleurx catheter on Monday and just to repeat a thoracentesis on 04/19/2021.  Palliative care is actively involved and patient would like to go home with hospice and so referral has been made to [facility].  We will continue Robinul.  Patient was supposed to have a repeat thoracentesis done today however he is more lethargic and desaturated with any movement.  He was a little altered and not as responsive and appeared to look worse today.  He is likely hypercarbic and pulmonary recommended transition to full comfort measures and only do the thorn centesis if his mentation improved.  Palliative met with the patient and the family and after further goals of care discussion all were in agreement to transition to full comfort measures.  His labs, medications and interventions that are not contributing to his comfort.  He was initiated on morphine infusion with 2 mg/h with 2 mg bolus every 15 minutes as needed in case and we could increase the basal rate if frequent bolus doses are needed.  Patient is also scheduled lorazepam 1 mg every 6 IV given that he is unable to take p.o.  Patient is likely to rapidly decline further and is not stable for transfer to outside residential hospice facility so anticipate hospital death within hours to days given his acute worsening  Assessment & Plan:   Active Problems:   Atrial fibrillation, chronic (HCC)   Right bundle branch block   Pleural effusion   Hyponatremia   COPD, severe (  [place])   Acute and chronic respiratory failure with hypoxia (HCC)   Acute on chronic respiratory failure with hypoxia and hypercapnia (HCC)   Acute on chronic diastolic CHF (congestive heart failure) (HCC)   Malignant pleural effusion  Acute on Chronic Hypoxic Respiratory Failure -Is likely multifactorial in the setting  of recurrent malignant pleural effusion and stage IV NSCLC, possible aspiration pneumonia, chronic pulm hypertension, underlying COPD in the setting of tobacco use disorder and 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, CHF as well as baseline deconditioning and now a loculated left hydropneumothorax -He is currently requiring 10 L supplemental oxygen but was weaned down to 7 this morning; he desaturates to the 70s with standing and short distance ambulation and takes about 2 minutes to recover -Continue high flow nasal cannula and BiPAP overnight -Plan was for Pleurx catheter placement on 04/22/2021 but after further goals of care discussion with the pulmonologist today the patient elects to go on to go home with hospice and will be pursuing a repeat thoracentesis on 04/19/2021 for symptom relief -PCCM and palliative care is following -We will continue weaning efforts in hopes of decreasing up to facilitate discharge home with hospice -We will try morphine 2.5 mg p.o. every 4 as needed for shortness of breath if required for air hunger -Continue with incentive spirometer, flutter valve and pulmonary hygiene -Patient is status post 7 days of antibiotics with IV vancomycin and IV Zosyn -Chest x-ray the day before showed "Increased size of the moderate-sized right pleural effusion with worsening aeration of the right lung.  No definite residual right pneumothorax. Slightly improved aeration of the left base." -P.o. Torsemide 20 mg p.o. twice daily -We will add Xopenex and Atrovent as needed every 6 but this has now been stopped -Inhaled therapies and inhalers have now stopped -Continued sildenafil 20 g p.o. 3 times daily for his pulmonary hypertension -Continue to mobilize toward her extent possible to minimize deconditioning -He has a compensatory metabolic alkalosis that is noted and has been given acetazolamide to avoid respiratory depression **The above all medications the patient's comfort have been discontinued  as he is being transitioned to full comfort measures; he has been placed on a morphine drip now given glycopyrrolate, haloperidol, IV lorazepam  PAF -Currently in NSR, 1st Degree AVB -Amiodarone 200 mg po Daily now stopped -Anticoagulations also been stopped  Hypotension -Continued Midodrine 10 mg p.o. 3 times daily with meals but discontinued after he was transitioned to full comfort measures  Hx of PUD/GERD/GI Prophylaxis -C/w PPI BID 40 mg po BID but now have discontinued given his comfort measures  Chronic Diastolic Heart Failure with Preserved EF  -As above -We were continuing torsemide 20 mg po BID while he was hospitalized but now have been discontinued given that he is transition to full comfort measures -Strict I's and O's and Daily Weights; Patient is - 12.139 liters  -We will not continue to monitor for signs and symptoms of volume overload  Anemia of Chronic Disease -C/w Ferrous Sulfate 325 mg po Daily  -Patient's Hgb/Hct went from 8.1/27.4 -> 8.0/27.1 -> 8.6/28.2 -> 7.3/24.9 -> and was now 7.4/25.5 today -Continue to Monitor for S/Sx of Bleeding; No overt bleeding noted -Repeat CBC in the AM  Hyponatremia -Na+ went from 137 -> 134 -> 132 and is now 133 -In the setting of Diuretics and Lung Cancer -Continue to Monitor and Trend and repeat CMP in the AM   Pulmonary HTN -Sildenafil 20 mg po TID now stopped  Type 2 Diabetes  Mellitus -Recent HbA1c was 6.9 -C/w Semglee 10 units sq Daily and Moderate Novolog SSI AC -CBGs ranging from 127-155  HLD -Atorvastatin 40 mg po qHS now discontinued  Hypoalbuminemia -Patient's albumin level was severely low at 1.8  Abnormal LFTs -AST and ALT are elevated with a AST of 54 and ALT of 72; they are slightly improved from yesterday as AST was 67 and ALT was 81 -Will not continue to monitor and trend given that he is being transitioned to full comfort care  Anxiety -Discontinued Buspirone 7.5 mg po BID and Lorazepam 0.25 mg po  [id number] Anxiety and has been scheduled IV lorazepam as above  DVT prophylaxis: Anticoagulated with Eliquis but will be held and transition to heparin drip Code Status: DO NOT RESUSCITATE Family Communication: No family present at bedside  Disposition Plan: Patient decompensated this morning and was worse than yesterday's.  After further goals of care discussion with palliative care and the family decision was made to transition the patient to full comfort measures.  Given his decompensation it is felt that he is too high risk to transfer to a residential hospice facility and imminent in-hospital death is pending  Status is: Inpatient  Remains inpatient appropriate because: Patient still requires high amount of oxygen will need a Pleurx catheter drainage   Consultants:  Palliative Care Pulmonary   Procedures:  Thoracentesis x2  Antimicrobials:  Anti-infectives (From admission, onward)    Start     Dose/Rate Route Frequency Ordered Stop   04/04/21 1400  piperacillin-tazobactam (ZOSYN) IVPB 3.375 g        3.375 g 12.5 mL/hr over 240 Minutes Intravenous Every 8 hours 04/04/21 1251 04/09/21 0955   04/04/21 0800  vancomycin (VANCOREADY) IVPB 750 mg/150 mL        750 mg 150 mL/hr over 60 Minutes Intravenous Every 12 hours 04/02/2021 1933 04/09/21 1000   03/25/2021 2200  ceFEPIme (MAXIPIME) 2 g in sodium chloride 0.9 % 100 mL IVPB  Status:  Discontinued        2 g 200 mL/hr over 30 Minutes Intravenous Every 12 hours 04/15/2021 1858 03/26/2021 1920   04/02/2021 2000  vancomycin (VANCOREADY) IVPB 1500 mg/300 mL        1,500 mg 150 mL/hr over 120 Minutes Intravenous  Once 04/11/2021 1917 03/30/2021 2317   03/22/2021 2000  ceFEPIme (MAXIPIME) 2 g in sodium chloride 0.9 % 100 mL IVPB  Status:  Discontinued        2 g 200 mL/hr over 30 Minutes Intravenous Every 8 hours 03/27/2021 1917 04/04/21 1214        Subjective: Seen and examined at bedside and he was very lethargic this morning and very difficult to  arouse.  Once he was awake he was little confused.  Was very dyspneic with just minimal exertion.  Further goals of care discussion were held and a decision was made to transition the patient to full comfort measures.  Given that he is felt to be high risk it is unsafe to discharge him to a residential hospice.  Anticipate in-hospital death is pending.  Objective: Vitals:   04/19/21 0731 04/19/21 0820 04/19/21 0837 04/19/21 1117  BP: 99/73  105/65 (!) 99/54  Pulse: 76   80  Resp:    20  Temp:    98 F (36.7 C)  TempSrc:    Oral  SpO2: 100% 96%  (!) 89%  Weight:      Height:        Intake/Output Summary (Last  24 hours) at 04/19/2021 1520 Last data filed at 04/19/2021 1500 Gross per 24 hour  Intake 527.66 ml  Output 300 ml  Net 227.66 ml    Filed Weights   04/02/2021 1529 04/04/21 0414 04/04/21 1137  Weight: 68.4 kg 70.2 kg 68.8 kg   Examination: Physical Exam:  Constitutional: Thin elderly Caucasian male currently in no acute distress appears very lethargic and confused  eyes: PERRL, lids and conjunctivae normal, sclerae anicteric  ENMT: External Ears, Nose appear normal.  Neck: Appears normal, supple, no cervical masses, normal ROM, no appreciable thyromegaly; no appreciable JVD Respiratory: Diminished to auscultation bilaterally with coarse breath sounds and some rhonchi and some crackles, no wheezing, rales, rhonchi or crackles. Normal respiratory effort and patient is not tachypenic. No accessory muscle use.  Cardiovascular: RRR, no murmurs / rubs / gallops. S1 and S2 auscultated. No extremity edema. 2+ pedal pulses. No carotid bruits.  Abdomen: Soft, non-tender, non-distended. No masses palpated. No appreciable hepatosplenomegaly. Bowel sounds positive x4.  GU: Deferred. Musculoskeletal: No clubbing / cyanosis of digits/nails. No joint deformity upper and lower extremities.  Skin: No rashes, lesions, ulcers on a limited skin evaluation. No induration; Warm and dry.   Neurologic: Very lethargic and somnolent and very difficult to arouse but once he is aroused he is little confused Psychiatric: Impaired judgment and insight. Alert and oriented x 2. Normal mood and appropriate affect.   Data Reviewed: I have personally reviewed following labs and imaging studies  CBC: Recent Labs  Lab 04/14/21 0226 04/17/21 0235 04/18/21 0237 04/19/21 0147  WBC 8.7 9.1 10.0 11.1*  NEUTROABS  --   --  8.0* 9.0*  HGB 8.6* 7.3* 7.2* 7.4*  HCT 28.2* 24.9* 25.0* 25.5*  MCV 80.6 82.2 82.0 82.5  PLT 462* 432* 465* 466*    Basic Metabolic Panel: Recent Labs  Lab 04/14/21 0226 04/15/21 0216 04/17/21 0235 04/18/21 0237 04/19/21 0147  NA 137 134* 132* 134* 133*  K 3.3* 4.0 3.6 3.6 3.5  CL 90* 88* 89* 89* 90*  CO2 40* 40* 37* 37* 35*  GLUCOSE 99 167* 202* 147* 194*  BUN 19 23 26* 24* 27*  CREATININE 0.84 0.89 0.86 0.76 0.72  CALCIUM 7.8* 8.0* 8.0* 8.1* 8.0*  MG 1.5* 2.3  --  1.8 1.7  PHOS  --   --   --  4.2 4.0    GFR: Estimated Creatinine Clearance: 78 mL/min (by C-G formula based on SCr of 0.72 mg/dL). Liver Function Tests: Recent Labs  Lab 04/18/21 0237 04/19/21 0147  AST 67* 54*  ALT 81* 72*  ALKPHOS 83 84  BILITOT 0.5 0.4  PROT 5.8* 6.1*  ALBUMIN 1.8* 1.8*    No results for input(s): LIPASE, AMYLASE in the last 168 hours. No results for input(s): AMMONIA in the last 168 hours. Coagulation Profile: No results for input(s): INR, PROTIME in the last 168 hours. Cardiac Enzymes: No results for input(s): CKTOTAL, CKMB, CKMBINDEX, TROPONINI in the last 168 hours. BNP (last 3 results) No results for input(s): PROBNP in the last 8760 hours. HbA1C: No results for input(s): HGBA1C in the last 72 hours. CBG: Recent Labs  Lab 04/18/21 1152 04/18/21 1603 04/18/21 2128 04/19/21 0731 04/19/21 1118  GLUCAP 227* 133* 155* 127* 145*    Lipid Profile: No results for input(s): CHOL, HDL, LDLCALC, TRIG, CHOLHDL, LDLDIRECT in the last 72  hours. Thyroid Function Tests: No results for input(s): TSH, T4TOTAL, FREET4, T3FREE, THYROIDAB in the last 72 hours. Anemia Panel: No results for  input(s): VITAMINB12, FOLATE, FERRITIN, TIBC, IRON, RETICCTPCT in the last 72 hours. Sepsis Labs: No results for input(s): PROCALCITON, LATICACIDVEN in the last 168 hours.  No results found for this or any previous visit (from the past 240 hour(s)).   RN Pressure Injury Documentation:     Estimated body mass index is 23.76 kg/m as calculated from the following:   Height as of this encounter: 5' 7"  (1.702 m).   Weight as of this encounter: 68.8 kg.  Malnutrition Type:   Malnutrition Characteristics:   Nutrition Interventions:    Radiology Studies: DG CHEST PORT 1 VIEW  Result Date: 04/18/2021 CLINICAL DATA:  Shortness of breath, effusion EXAM: PORTABLE CHEST 1 VIEW COMPARISON:  Chest radiograph 04/17/2021 FINDINGS: The cardiomediastinal silhouette is stable. A moderate-sized right pleural effusion is again seen with adjacent airspace opacity, overall not significantly changed in the interim. The left lung remains clear. There is no left effusion. There is no pneumothorax. There is no acute osseous abnormality. IMPRESSION: Unchanged moderate sized right pleural effusion with adjacent airspace disease. Overall, no significant interval change. Electronically Signed   By: Valetta Mole M.D.   On: 04/18/2021 08:24    Scheduled Meds:  busPIRone  7.5 mg Oral BID   LORazepam  1 mg Intravenous Q6H   mouth rinse  15 mL Mouth Rinse q12n4p   Continuous Infusions:  sodium chloride Stopped (04/06/21 1445)   morphine 2 mg/hr (04/19/21 1318)    LOS: 16 days   Lake Angelus, DO [facility] PAGER is on AMION  If 7PM-7AM, please contact night-coverage [email or web address]

## 2021-04-19 NOTE — Progress Notes (Signed)
Patient noted to be lethargic after RT switched from bi-pap to HFNC. Prior today on bi-pap. Unable to assess LOC. VSS. Notified charge nurse and other nurses on the unit that had cared for patient before and this is a change for him. Notified Dr. Alfredia Ferguson and orders given for ABG, Dr. Alfredia Ferguson into see patient. Dr. Lake Bells into see patient and dc'd heparin infusion for possible thoracentesis this afternoon.

## 2021-04-22 SURGERY — INSERTION, PLEURAL DRAINAGE CATHETER
Anesthesia: LOCAL | Laterality: Right

## 2021-04-30 ENCOUNTER — Ambulatory Visit: Payer: Medicare Other | Admitting: Cardiology

## 2021-05-02 LAB — FUNGUS CULTURE WITH STAIN

## 2021-05-02 LAB — FUNGAL ORGANISM REFLEX

## 2021-05-02 LAB — FUNGUS CULTURE RESULT

## 2021-05-19 NOTE — Progress Notes (Signed)
42m of morphine wasted with CAngela Nevin,RN.

## 2021-05-19 NOTE — Progress Notes (Signed)
Called by spouse to bedside.Pt noted no pulse, not breathing. Pt expired at 0520.Verified by coworker Royce Macadamia RN. @0525  Myrna Blazer notified via text page.

## 2021-05-19 NOTE — Death Summary Note (Signed)
DEATH SUMMARY   Patient Details  Name: Gary [name] MRN: [id number] DOB: 1951/02/28  Admission/Discharge Information   Admit Date:  April 27, 2021  Date of Death: Date of Death: May 14, 2021  Time of Death: Time of Death: 0520  Length of Stay: 07-29-2022  Referring Physician: Jalene Mullet, PA-C   Reason(s) for Hospitalization  Shortness of Breath  Diagnoses  Preliminary cause of death:   Acute cardiopulmonary arrest in the setting of acute on chronic hypoxic respiratory failure from recurrent malignant pleural effusion and stage stage IV NS CLC as well as possible aspiration pneumonia, chronic pulmonary hypertension, underlying COPD in the setting of tobacco abuse disorder as well as 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 as well as acute on chronic diastolic CHF with baseline deconditioning and loculated left hydropneumothorax  Secondary Diagnoses (including complications and co-morbidities):  Principal Problem:   End of life care Active Problems:   Atrial fibrillation, chronic (HCC)   Right bundle branch block   Pleural effusion   Hyponatremia   COPD, severe (HCC)   Acute and chronic respiratory failure with hypoxia (HCC)   Acute on chronic respiratory failure with hypoxia and hypercapnia (HCC)   Acute on chronic diastolic CHF (congestive heart failure) (HCC)   Malignant pleural effusion  Acute on Chronic Hypoxic Respiratory Failure -Is likely multifactorial in the setting of recurrent malignant pleural effusion and stage IV NSCLC, possible aspiration pneumonia, chronic pulm hypertension, underlying COPD in the setting of tobacco use disorder and 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, CHF as well as baseline deconditioning and now a loculated left hydropneumothorax -He is currently requiring 10 L supplemental oxygen but was weaned down to 7 this morning; he desaturates to the 70s with standing and short distance ambulation and takes about 2 minutes to recover -Continue high flow nasal cannula and BiPAP  overnight -Plan was for Pleurx catheter placement on 04/22/2021 but after further goals of care discussion with the pulmonologist today the patient elects to go on to go home with hospice and will be pursuing a repeat thoracentesis on 04/19/2021 for symptom relief -PCCM and palliative care is following -We will continue weaning efforts in hopes of decreasing up to facilitate discharge home with hospice -We will try morphine 2.5 mg p.o. every 4 as needed for shortness of breath if required for air hunger -Continue with incentive spirometer, flutter valve and pulmonary hygiene -Patient is status post 7 days of antibiotics with IV vancomycin and IV Zosyn -Chest x-ray the day before showed "Increased size of the moderate-sized right pleural effusion with worsening aeration of the right lung.  No definite residual right pneumothorax. Slightly improved aeration of the left base." -P.o. Torsemide 20 mg p.o. twice daily -We will add Xopenex and Atrovent as needed every 6 but this has now been stopped -Inhaled therapies and inhalers have now stopped -Continued sildenafil 20 g p.o. 3 times daily for his pulmonary hypertension -Continue to mobilize toward her extent possible to minimize deconditioning -He has a compensatory metabolic alkalosis that is noted and has been given acetazolamide to avoid respiratory depression **See Above as all medications not in the patient's comfort have been discontinued as he is being transitioned to full comfort measures; he has been placed on a morphine drip now given glycopyrrolate, haloperidol, IV lorazepam   PAF -Currently in NSR, 1st Degree AVB -Amiodarone 200 mg po Daily now stopped -Anticoagulations also been stopped given that has been transitioned to full comfort measures   Hypotension -Continued Midodrine 10 mg p.o. 3 times daily  with meals but discontinued after he was transitioned to full comfort measures   Hx of PUD/GERD/GI Prophylaxis -C/w PPI BID 40 mg po  BID but now have discontinued given his comfort measures   Chronic Diastolic Heart Failure with Preserved EF  -As above -We were continuing torsemide 20 mg po BID while he was hospitalized but now have been discontinued given that he is transition to full comfort measures -Strict I's and O's and Daily Weights; Patient is - 12.798 liters  -We will not continue to monitor for signs and symptoms of volume overload   Anemia of Chronic Disease -C/w Ferrous Sulfate 325 mg po Daily  -Patient's Hgb/Hct went from 8.1/27.4 -> 8.0/27.1 -> 8.6/28.2 -> 7.3/24.9 -> and was now 7.4/25.5 today -Continue to Monitor for S/Sx of Bleeding; No overt bleeding noted -Did not repeat CBC given that he is comfort measures   Hyponatremia -Na+ went from 137 -> 134 -> 132 and is now 133 -In the setting of Diuretics and Lung Cancer -Continue to Monitor and Trend and will not repeat blood work given that he is now comfort measures   Pulmonary HTN -Sildenafil 20 mg po TID now stopped   Type 2 Diabetes Mellitus -Recent HbA1c was 6.9 -C/w Semglee 10 units sq Daily and Moderate Novolog SSI AC -CBGs ranging from CBGs ranged from 127-227   HLD -Atorvastatin 40 mg po qHS now discontinued   Hypoalbuminemia -Patient's albumin level was severely low at 1.8   Abnormal LFTs -AST and ALT are elevated with a AST of 54 and ALT of 72; they are slightly improved from the day before yesterday as AST was 67 and ALT was 81 -Will not continue to monitor and trend given that he is being transitioned to full comfort care   Anxiety -Discontinued Buspirone 7.5 mg po BID and Lorazepam 0.25 mg po q6hprn Anxiety and has been scheduled IV lorazepam as above   Brief Hospital Course (including significant findings, care, treatment, and services provided and events leading to death)  The patient is a 71 year old Caucasian male who is a retired [employer] administrator with a past medical history significant for but not limited to history of chronic  hypoxic respiratory failure, chronic history of heart failure with preserved ejection fraction, pulm hypertension, type 2 diabetes mellitus, history of progressive lung mass with bronchoscopy being declined by the patient who previously presented to [facility]/04/2021 with respiratory distress, hypoxia, hypotension requiring noninvasive positive pressure ventilation pressure support.  Initial thoracentesis of loculated pleural effusion was unsuccessful and he was transferred to the ICU at [facility] for a 2 L of blood-tinged fluid that was drained the right thoracentesis on 04/04/2021.  His respiratory distress continued despite diuretic antibiotics after his thoracentesis.  Repeat thoracentesis was performed on 03/27/2021 with 1.5 L of complicated pneumothorax ex vacuo.  His FiO2 requirements have come down some but he required significant mount oxygen and was on 10 L.  Today he is on 7 L and palliative continues to follow.     Pulmonary evaluated yesterday and felt that he would benefit from a Pleurx catheter.  It was not previously performed given his severe orthopnea but now the plan is for Pleurx placement on 04/22/2021 in the afternoon in the Endo suite.  He was anticoagulated with apixaban and this will need to be washed out so his oral anticoagulation was held and he was initiated on heparin drip which will need to be stopped Sunday p.m. however after further goals of care discussion  with the pulmonologist today Dr. Lake Bells discussed the fact the Pleurx catheter may bring some symptom relief but would not change his overall prognosis and that the risk of placing it was nontrivial.  The patient and his family ultimately decided that best supportive option would be hospice and they would pursue another thoracentesis to see if it would provide symptom relief.  Plan is now to cancel the Pleurx catheter on Monday and just to repeat a thoracentesis on 04/19/2021.  Palliative care is actively involved and  patient would like to go home with hospice and so referral has been made to [facility].  We will continue Robinul.   Patient was supposed to have a repeat thoracentesis done today however he is more lethargic and desaturated with any movement.  He was a little altered and not as responsive and appeared to look worse today.  He is likely hypercarbic and pulmonary recommended transition to full comfort measures and only do the thorn centesis if his mentation improved.  Palliative met with the patient and the family and after further goals of care discussion all were in agreement to transition to full comfort measures.  His labs, medications and interventions that are not contributing to his comfort.  He was initiated on morphine infusion with 2 mg/h with 2 mg bolus every 15 minutes as needed in case and we could increase the basal rate if frequent bolus doses are needed.  Patient is also scheduled lorazepam 1 mg every 6 IV given that he is unable to take p.o.    Patient as expected rapidly declined further and was not stable for transfer to outside residential hospice facility so anticipated hospital death within hours and he subsequently passed away at 5:20 AM on 05-16-21 with his wife being called to bedside.  Patient was noted to have no pulse and not breathing and was verified by 2 nurses and the night coverage was notified.   Pertinent Labs and Studies  Significant Diagnostic Studies DG Chest 1 View  Result Date: 03/22/2021 CLINICAL DATA:  Follow-up pneumothorax EXAM: CHEST  1 VIEW COMPARISON:  04/10/2021, CT 04/13/2021, radiograph 04/10/2021 FINDINGS: Cardiomegaly with worsened vascular congestion and perihilar edema. Bilateral pleural effusions. Consolidation at left lung base. A right-sided pneumothorax is probably stable but there appears to be reaccumulation of pleural fluid or airspace disease at the right base. IMPRESSION: 1. Right-sided hydropneumothorax; the pneumothorax is probably  stable in size but suspect that there is reaccumulation of pleural fluid as the inferior edge is less well-defined compared to the radiograph earlier today. 2. Cardiomegaly with vascular congestion, worsened edema and continued airspace disease at both bases. Probable small left effusion Electronically Signed   By: Donavan Foil M.D.   On: 03/26/2021 21:32   DG Chest 2 View  Result Date: 04/01/2021 CLINICAL DATA:  Severe shortness of breath. EXAM: CHEST - 2 VIEW COMPARISON:  CT chest 12/31/2020; X-ray chest 12/10/2020. FINDINGS: The right heart border is obscured. Aortic atherosclerosis is noted. There is a moderately large right pleural effusion which has greatly increased in size from the prior chest radiograph and may have also enlarged from the interval CT. There is associated right basilar atelectasis or consolidation. Increased interstitial densities are present in the aerated right upper lobe and left lung. No pneumothorax is identified. No acute osseous abnormality is seen. IMPRESSION: 1. Enlarging, moderately large right pleural effusion with right basilar atelectasis or consolidation. 2. Bilateral interstitial densities which could reflect edema or infection. Electronically Signed   By:  Logan Bores M.D.   On: 04/01/2021 17:15   CT CHEST WO CONTRAST  Result Date: 03/21/2021 CLINICAL DATA:  Abnormal chest x-ray with pleural effusion. EXAM: CT CHEST WITHOUT CONTRAST TECHNIQUE: Multidetector CT imaging of the chest was performed following the standard protocol without IV contrast. COMPARISON:  December 31, 2020 FINDINGS: Cardiovascular: There is marked severity calcification of the aortic arch. Normal heart size with marked severity coronary artery calcification. A very small (approximately 5 mm) pericardial effusion is seen. Mediastinum/Nodes: A 2.6 cm x 2.0 cm anterior para carinal lymph node is seen. This is predominant stable in appearance. Thyroid gland, trachea, and esophagus demonstrate no  significant findings. Lungs/Pleura: A stable 6 mm noncalcified lung nodule is seen within the posteromedial aspect of the left upper lobe (axial CT image 23, CT series 5). Marked severity areas of airspace disease are seen involving predominantly the right middle lobe, right lower lobe and inferior aspect of the right upper lobe. This is mildly increased in severity when compared to the prior study. A moderate sized right-sided pleural effusion is seen with loculated components seen along the lateral and posteromedial aspects of the right lung. This is mildly decreased in size when compared to the prior study. No pneumothorax is identified. Upper Abdomen: A stable 2.0 cm x 0.9 cm fat density right adrenal mass is seen. Musculoskeletal: Multilevel degenerative changes are noted throughout the thoracic spine. IMPRESSION: 1. Marked severity airspace disease involving predominantly the right middle lobe, right lower lobe and inferior aspect of the right upper lobe, mildly increased in severity when compared to the prior study. 2. Moderate sized right-sided pleural effusion with loculated components seen along the lateral and posteromedial aspects of the right lung, mildly decreased in size when compared to the prior study. 3. Stable 6 mm noncalcified lung nodule within the posteromedial aspect of the left upper lobe. Non-contrast chest CT at 6-12 months is recommended. If the nodule is stable at time of repeat CT, then future CT at 18-24 months (from today's scan) is considered optional for low-risk patients, but is recommended for high-risk patients. This recommendation follows the consensus statement: Guidelines for Management of Incidental Pulmonary Nodules Detected on CT Images: From the Fleischner Society 2017; Radiology 2017; 284:228-243. 4. Stable 2.6 cm x 2.0 cm anterior para carinal lymph node. 5. 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 6. Marked severity coronary  artery calcification. 7. Aortic atherosclerosis. Aortic Atherosclerosis (ICD10-I70.0). Electronically Signed   By: Virgina Norfolk M.D.   On: 04/14/2021 17:30   DG CHEST PORT 1 VIEW  Result Date: 04/18/2021 CLINICAL DATA:  Shortness of breath, effusion EXAM: PORTABLE CHEST 1 VIEW COMPARISON:  Chest radiograph 04/17/2021 FINDINGS: The cardiomediastinal silhouette is stable. A moderate-sized right pleural effusion is again seen with adjacent airspace opacity, overall not significantly changed in the interim. The left lung remains clear. There is no left effusion. There is no pneumothorax. There is no acute osseous abnormality. IMPRESSION: Unchanged moderate sized right pleural effusion with adjacent airspace disease. Overall, no significant interval change. Electronically Signed   By: Valetta Mole M.D.   On: 04/18/2021 08:24   DG Chest Port 1 View  Result Date: 04/17/2021 CLINICAL DATA:  Pleural effusion, shortness of breath EXAM: PORTABLE CHEST 1 VIEW COMPARISON:  Chest radiograph 04/13/2021 FINDINGS: Cardiomediastinal silhouette is stable. There is a moderate size right pleural effusion which appears increased in size since 04/13/2021. There is worsening aeration of the right lower lobe. The right upper  lung remains aerated. The previously seen right pneumothorax is not appreciated. Aeration of the left base appears improved. There is no new or worsening airspace disease on the left. There is no significant left effusion. There is no left pneumothorax. There is no acute osseous abnormality. IMPRESSION: 1. Increased size of the moderate-sized right pleural effusion with worsening aeration of the right lung. 2. No definite residual right pneumothorax. 3. Slightly improved aeration of the left base. Electronically Signed   By: Valetta Mole M.D.   On: 04/17/2021 13:03   DG CHEST PORT 1 VIEW  Result Date: 04/13/2021 CLINICAL DATA:  Shortness of breath.  Right hydropneumothorax. EXAM: PORTABLE CHEST 1 VIEW  COMPARISON:  03/24/2021 FINDINGS: A small to moderate right hydropneumothorax is unchanged since prior study. Right lower lung consolidation is unchanged. Decreased airspace opacity is seen in the left lower lobe compared to prior exam. Cardiomegaly remains stable. IMPRESSION: Stable small to moderate right hydropneumothorax. Stable right lower lung consolidation. Decreased left lower lobe airspace disease. Electronically Signed   By: Marlaine Hind M.D.   On: 04/13/2021 08:45   DG CHEST PORT 1 VIEW  Result Date: 04/13/2021 CLINICAL DATA:  Status post thoracentesis. EXAM: PORTABLE CHEST 1 VIEW COMPARISON:  CT of the chest 04/17/2021.  Chest x-ray 04/07/2021. FINDINGS: There is a moderate right-sided hydropneumothorax which appears loculated in the lower right hemithorax. The pneumothorax portion is new from prior. Density in the right lower lung is unchanged from prior study. There is no mediastinal shift. Cardiomegaly and central pulmonary vascular congestion persists. Left basilar atelectasis is unchanged. No acute fractures. New mild right chest wall emphysema. IMPRESSION: 1. Moderate right hydropneumothorax which appears loculated in the lower right hemithorax. 2. New right chest wall emphysema. 3. Stable cardiomegaly with central pulmonary vascular congestion. 4. These results were called by telephone at the time of interpretation on 03/29/2021 at 5:09 pm to provider nurse Helene Kelp, who verbally acknowledged these results. Electronically Signed   By: Ronney Asters M.D.   On: 04/10/2021 17:11   DG CHEST PORT 1 VIEW  Result Date: 04/10/2021 CLINICAL DATA:  Shortness of breath EXAM: PORTABLE CHEST 1 VIEW COMPARISON:  Chest x-ray dated April 07, 2021 FINDINGS: Visualized cardiac and mediastinal contours are unchanged. Moderate right pleural effusion is increased in size compared to prior exam. New small left pleural effusions. Increased lower lung predominant opacities. No evidence of pneumothorax.  IMPRESSION: 1. Increased moderate right pleural effusion and new small left pleural effusion. 2. Increased lower lung predominant opacities, likely a combination of pulmonary edema and atelectasis Electronically Signed   By: Yetta Glassman M.D.   On: 04/10/2021 09:49   DG CHEST PORT 1 VIEW  Result Date: 04/07/2021 CLINICAL DATA:  Pneumonia.  Pleural effusion. EXAM: PORTABLE CHEST 1 VIEW COMPARISON:  04/05/2021 FINDINGS: 0616 hours. Low lung volumes. The cardio pericardial silhouette is enlarged. There is pulmonary vascular congestion without overt pulmonary edema. Right base collapse/consolidation with pleural effusion again noted. The tiny right-sided pneumothorax is similar. Telemetry leads overlie the chest. IMPRESSION: 1. Right base collapse/consolidation with right pleural effusion similar to minimally progressive in the interval. 2. Tiny right apical pneumothorax is similar to prior. Electronically Signed   By: Misty Stanley M.D.   On: 04/07/2021 08:47   DG Chest Port 1 View  Addendum Date: 04/05/2021   ADDENDUM REPORT: 04/05/2021 06:15 ADDENDUM: Note that differential diagnosis of the abnormal right lower lobe on recent Chest CT includes BRONCHOGENIC CARCINOMA. Electronically Signed   By: Genevie Ann  M.D.   On: 04/05/2021 06:15   Result Date: 04/05/2021 CLINICAL DATA:  71 year old male with respiratory failure. Status post thoracentesis yesterday. EXAM: PORTABLE CHEST 1 VIEW COMPARISON:  Portable chest 04/04/2021 chest CT 03/19/2021. FINDINGS: Portable AP semi upright view at 0537 hours. Substantially decreased right pneumothorax which appeared to be ex vacuo yesterday. Small volume residual visible in the apex and along the right lateral ribs. Ongoing right lower lung consolidation. Small to moderate residual or re-accumulated right pleural effusion. Stable cardiomegaly and mediastinal contours. Left lung appears stable. Calcified aortic atherosclerosis. Visualized tracheal air column is within  normal limits. No acute osseous abnormality identified. IMPRESSION: 1. Substantially decreased right pneumothorax with small residual. 2. Right lower lobe pneumonia. Small to moderate residual or re-accumulated right pleural effusion. Electronically Signed: By: Genevie Ann M.D. On: 04/05/2021 06:11   DG CHEST PORT 1 VIEW  Result Date: 04/04/2021 CLINICAL DATA:  Status post right thoracentesis. EXAM: PORTABLE CHEST 1 VIEW COMPARISON:  April 03, 2021. FINDINGS: Right pleural effusion is nearly resolved status post thoracentesis. Right basilar pneumothorax ex vacuo is noted. Right lower lobe opacity is noted most consistent with atelectasis or scarring. Mild left basilar subsegmental atelectasis is noted. IMPRESSION: Moderate right basilar pneumothorax ex vacuo is noted status post right thoracentesis, due to incomplete expansion of the lung secondary to longstanding pleural effusion and probable cortical scarring. Electronically Signed   By: Marijo Conception M.D.   On: 04/04/2021 13:51   DG Chest Port 1 View  Result Date: 04/10/2021 CLINICAL DATA:  Shortness of breath, COPD, pleural effusion EXAM: PORTABLE CHEST 1 VIEW COMPARISON:  04/01/2021 FINDINGS: Cardiomegaly. Large right pleural effusion and associated atelectasis or consolidation, similar to prior examination. Diffuse bilateral interstitial pulmonary opacity. The visualized skeletal structures are unremarkable. IMPRESSION: 1. Cardiomegaly with large right pleural effusion and associated atelectasis or consolidation, similar to prior examination. 2. Diffuse bilateral interstitial pulmonary opacity, likely interstitial edema and similar to prior. No new or focal airspace opacity. 3. Cardiomegaly. Electronically Signed   By: Delanna Ahmadi M.D.   On: 03/30/2021 10:34    Microbiology No results found for this or any previous visit (from the past 240 hour(s)).  Lab Basic Metabolic Panel: Recent Labs  Lab 04/14/21 0226 04/15/21 0216 04/17/21 0235  04/18/21 0237 04/19/21 0147  NA 137 134* 132* 134* 133*  K 3.3* 4.0 3.6 3.6 3.5  CL 90* 88* 89* 89* 90*  CO2 40* 40* 37* 37* 35*  GLUCOSE 99 167* 202* 147* 194*  BUN 19 23 26* 24* 27*  CREATININE 0.84 0.89 0.86 0.76 0.72  CALCIUM 7.8* 8.0* 8.0* 8.1* 8.0*  MG 1.5* 2.3  --  1.8 1.7  PHOS  --   --   --  4.2 4.0   Liver Function Tests: Recent Labs  Lab 04/18/21 0237 04/19/21 0147  AST 67* 54*  ALT 81* 72*  ALKPHOS 83 84  BILITOT 0.5 0.4  PROT 5.8* 6.1*  ALBUMIN 1.8* 1.8*   No results for input(s): LIPASE, AMYLASE in the last 168 hours. No results for input(s): AMMONIA in the last 168 hours. CBC: Recent Labs  Lab 04/14/21 0226 04/17/21 0235 04/18/21 0237 04/19/21 0147  WBC 8.7 9.1 10.0 11.1*  NEUTROABS  --   --  8.0* 9.0*  HGB 8.6* 7.3* 7.2* 7.4*  HCT 28.2* 24.9* 25.0* 25.5*  MCV 80.6 82.2 82.0 82.5  PLT 462* 432* 465* 466*   Cardiac Enzymes: No results for input(s): CKTOTAL, CKMB, CKMBINDEX, TROPONINI in the last  168 hours. Sepsis Labs: Recent Labs  Lab 04/14/21 0226 04/17/21 0235 04/18/21 0237 04/19/21 0147  WBC 8.7 9.1 10.0 11.1*   Consultants:  Palliative Care Pulmonary   Procedures/Operations  Thoracentesis x2  Raiford Noble, DO [facility] May 14, 2021, 8:49 PM

## 2021-05-19 NOTE — Progress Notes (Signed)
   Palliative Medicine Inpatient Follow Up Note  I stopped by Gary [name]'s room this morning. I was told by nursing staff that he had recently passed away.   There was no family present at bedside.   Palliative care will be available if additional support is needed.  No Charge ______________________________________________________________________________________ [facility] Team Team Cell Phone: [phone] Please utilize secure chat with additional questions, if there is no response within 30 minutes please call the above phone number  Palliative Medicine Team providers are available by phone from 7am to 7pm daily and can be reached through the team cell phone.  Should this patient require assistance outside of these hours, please call the patient's attending physician.

## 2021-05-19 DEATH — deceased

## 2021-06-06 ENCOUNTER — Encounter (HOSPITAL_COMMUNITY): Payer: Self-pay | Admitting: Emergency Medicine

## 2022-04-23 IMAGING — CT CT ANGIO CHEST
2 of 6 series · 17 of 46 positions shown · IV contrast (omnipaque)
Comparison: 06/27/2020

CLINICAL DATA: RIGHT ventricular dilatation, commas sign, worsening
dyspnea, atrial fibrillation, suspected pulmonary embolism; history
diabetes mellitus, hyperlipidemia, hypertension

EXAM:
CT ANGIOGRAPHY CHEST WITH CONTRAST
TECHNIQUE: Multidetector CT imaging of the chest was performed using the
standard protocol during bolus administration of intravenous
contrast. Multiplanar CT image reconstructions and MIPs were
obtained to evaluate the vascular anatomy.
CONTRAST:  75mL OMNIPAQUE IOHEXOL 350 MG/ML SOLN IV

[Series 6: thins · axial · 0.72mm/px · z∈[+1295,+1529]mm · 14 of 258 slices shown]
[im 12/258  lung]
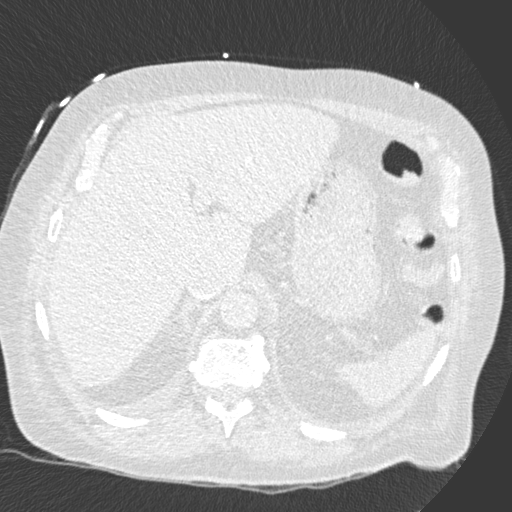
[im 34/258  soft-tissue]
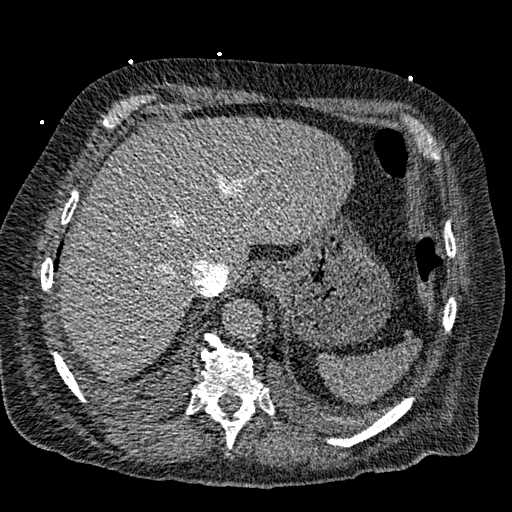
[im 45/258  lung]
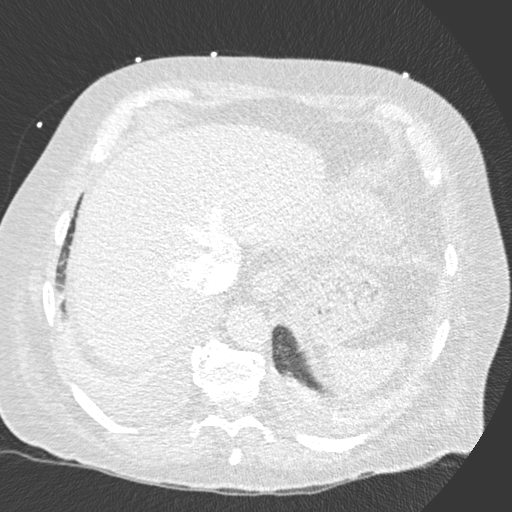
[im 68/258  soft-tissue]
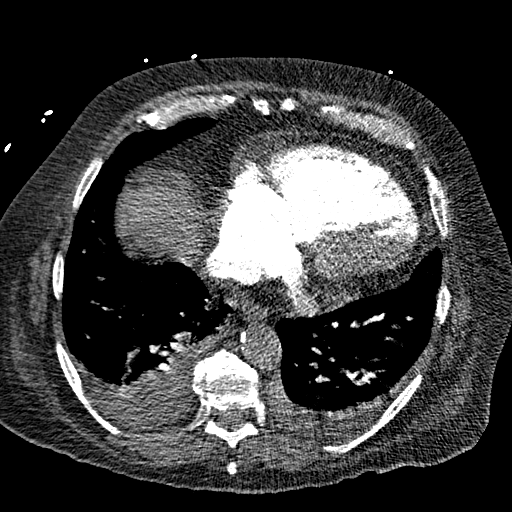
[im 90/258  lung]
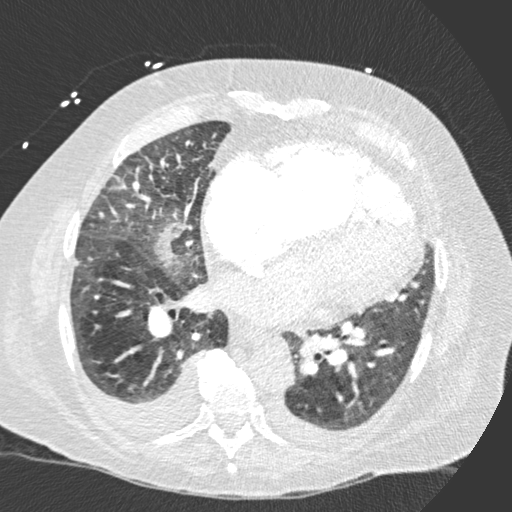
[im 101/258  soft-tissue]
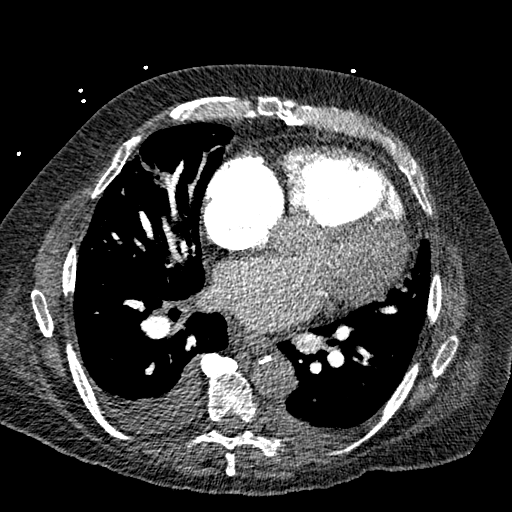
[im 123/258  lung]
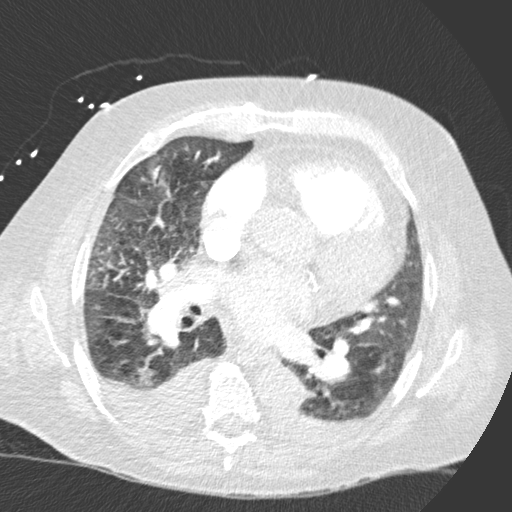
[im 135/258  soft-tissue]
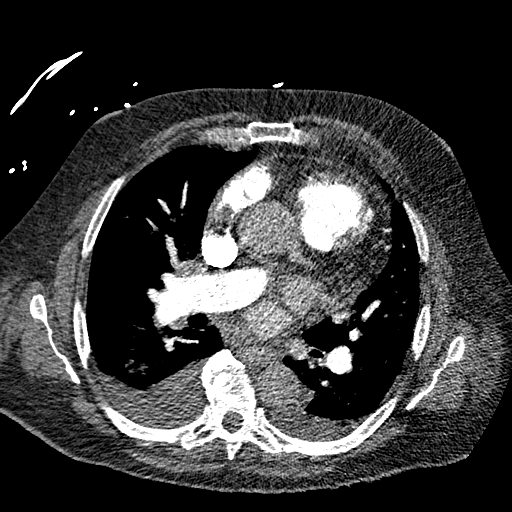
[im 157/258  lung]
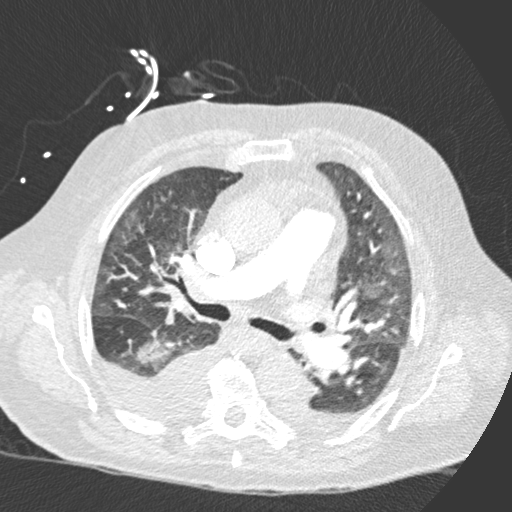
[im 168/258  soft-tissue]
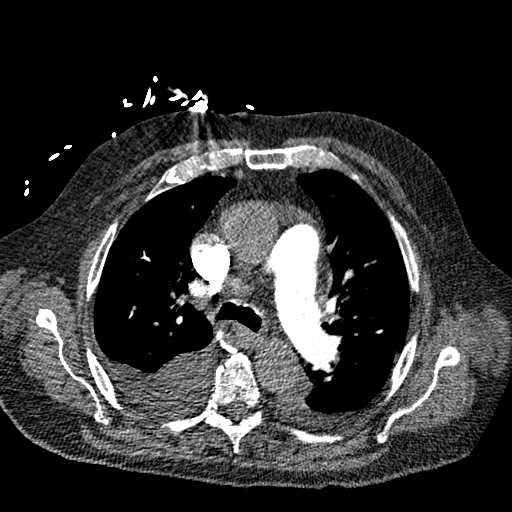
[im 190/258  lung]
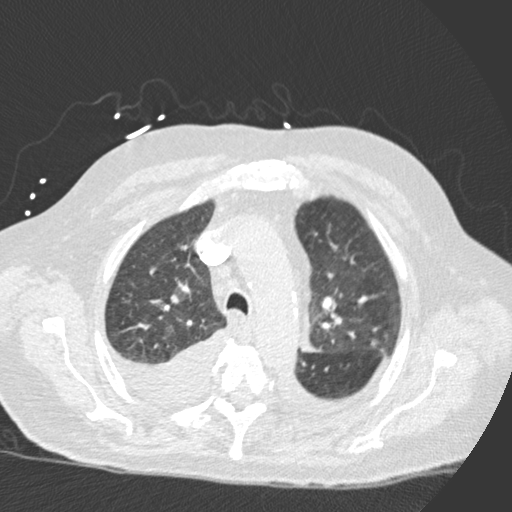
[im 213/258  soft-tissue]
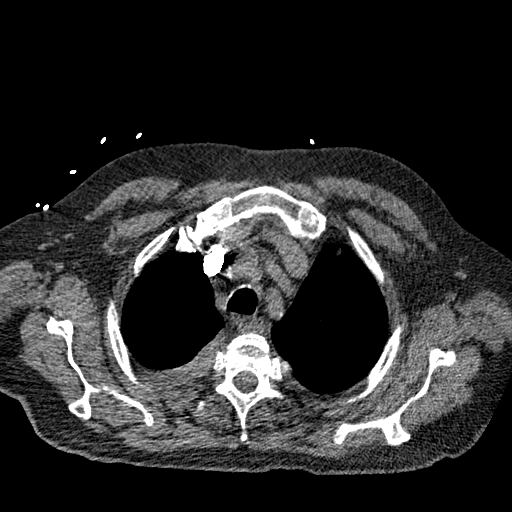
[im 224/258  lung]
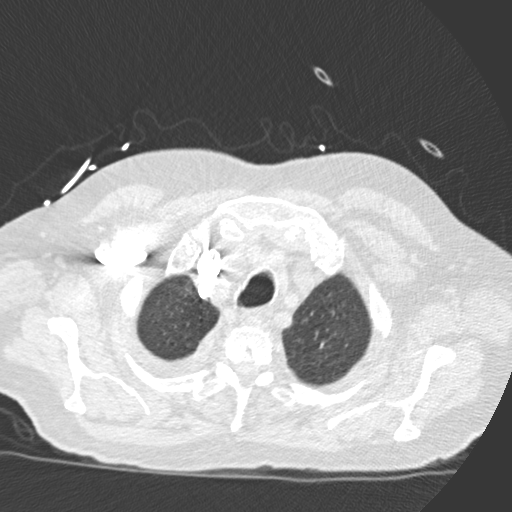
[im 246/258  soft-tissue]
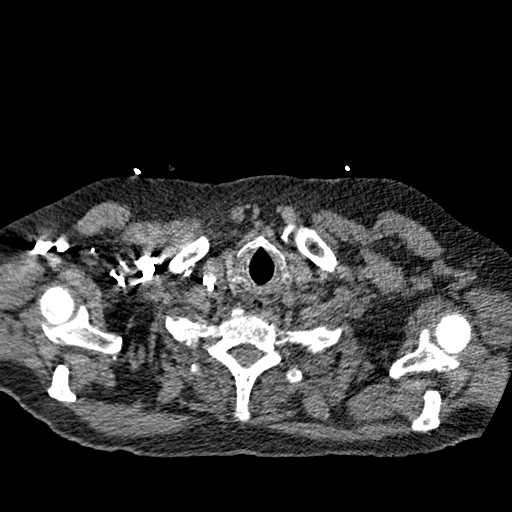

[Series 8: coronal mpr · coronal · 0.52mm/px · 3 of 159 slices shown]
[im 40/159  soft-tissue]
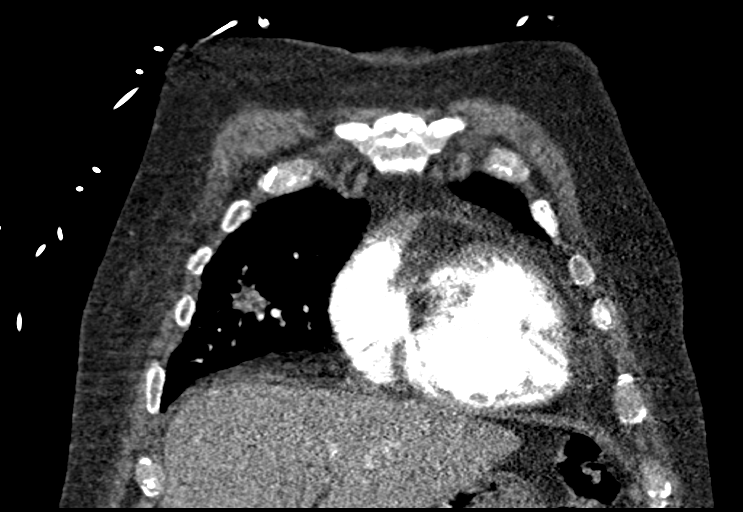
[im 80/159  soft-tissue]
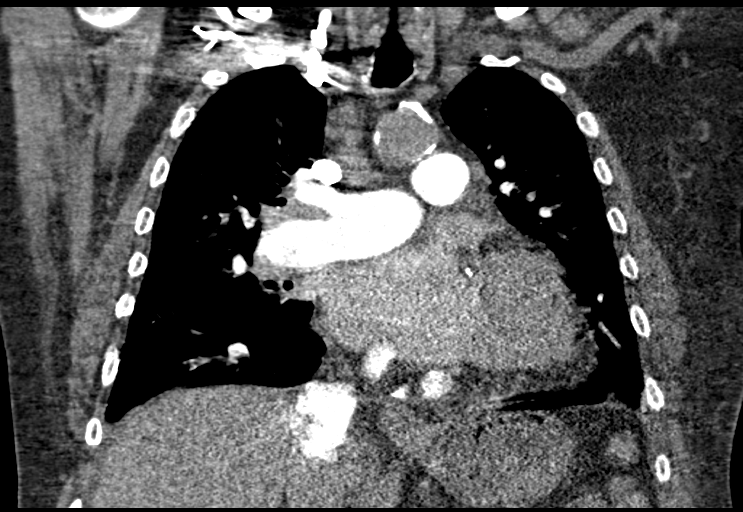
[im 119/159  soft-tissue]
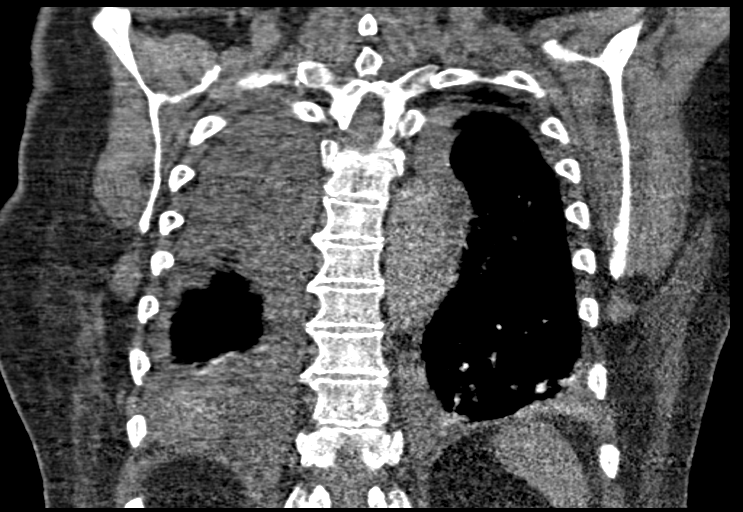

[17 of 46 positions shown; findings below may reference images not displayed]

FINDINGS: Cardiovascular: Atherosclerotic calcifications aorta, proximal great
vessels, and coronary arteries. Ascending thoracic aorta upper
normal caliber 3.9 cm diameter. Enlargement of cardiac chambers
especially RIGHT atrium and RIGHT ventricle. No pericardial
effusion. Pulmonary arteries adequately opacified and patent. No
evidence of pulmonary embolism. Enlargement of central pulmonary
arteries consistent with pulmonary arterial hypertension.

Mediastinum/Nodes: Esophagus unremarkable. Base of cervical region
normal appearance. Normal sized RIGHT paratracheal lymph nodes up to
10 mm diameter single enlarged subcarinal node 1.8 cm image 38
previously 1.4 cm.

Lungs/Pleura: Small BILATERAL pleural effusions greater on RIGHT.
Patchy infiltrates RIGHT lung favor pneumonia. An area of opacity at
the RIGHT middle lobe was present on the previous exam, little
changed, 2.7 x 1.2 X 2.5 cm. Minimal dependent atelectasis LEFT lung
with LEFT lung otherwise clear.

Upper Abdomen: Reflux of contrast into IVC and hepatic veins.
Visualized upper abdomen otherwise unremarkable.

Musculoskeletal: Degenerative disc disease changes thoracic spine.

Review of the MIP images confirms the above findings.
IMPRESSION: No evidence of pulmonary embolism.

Enlarged central pulmonary arteries consistent with pulmonary
arterial hypertension.

Associated enlargement of cardiac chambers especially RIGHT
ventricle and RIGHT atrium.

Small BILATERAL pleural effusions greater on RIGHT.

Patchy infiltrates RIGHT lung favor pneumonia.

Persistent area of opacity in the RIGHT middle lobe could represent
atelectasis, unresolved infiltrate, tumor not excluded; recommend
continued follow-up until resolution and if this fails to resolve
then consider further assessment by PET-CT.

Single enlarged subcarinal lymph node 1.8 cm diameter, previously
1.4 cm, nonspecific.

Aortic Atherosclerosis ([id number]).

## 2022-04-26 IMAGING — CT CT ABD-PELV W/O CM
2 of 4 series · 15 of 46 positions shown, 17 images · non-contrast
Comparison: February 12, 2020 and September 03, 2020

CLINICAL DATA: New onset anemia. Patient on anticoagulation. Rule
out retroperitoneal bleed.

EXAM:
CT ABDOMEN AND PELVIS WITHOUT CONTRAST
TECHNIQUE: Multidetector CT imaging of the abdomen and pelvis was performed
following the standard protocol without IV contrast.

[Series 3: abd/ pelvis 5.0 i30f 2 · axial · 0.79mm/px · z∈[-454,-40]mm · 12 of 95 slices shown, 14 images]
[im 8/95  soft-tissue]
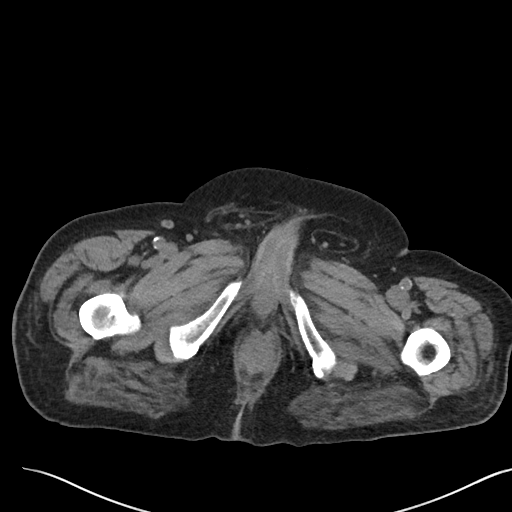
[im 8/95  bone]
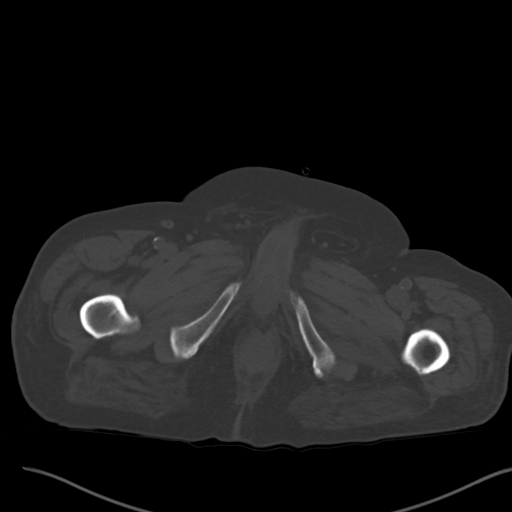
[im 16/95  soft-tissue]
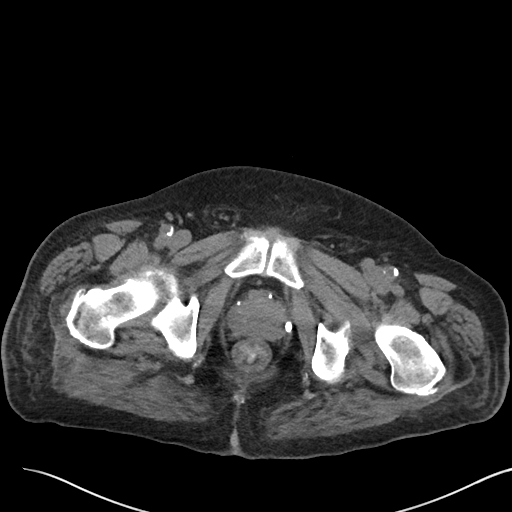
[im 23/95  soft-tissue]
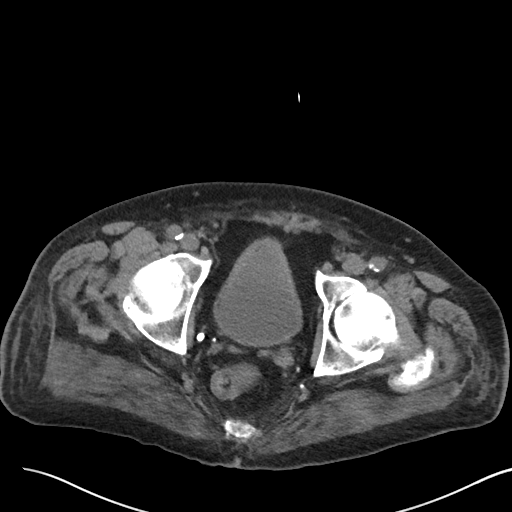
[im 31/95  soft-tissue]
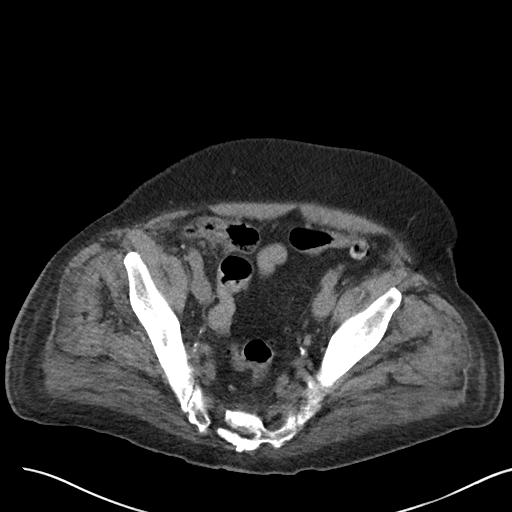
[im 38/95  soft-tissue]
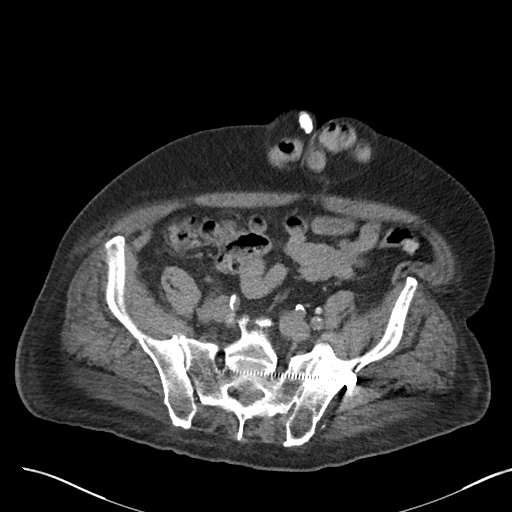
[im 46/95  soft-tissue]
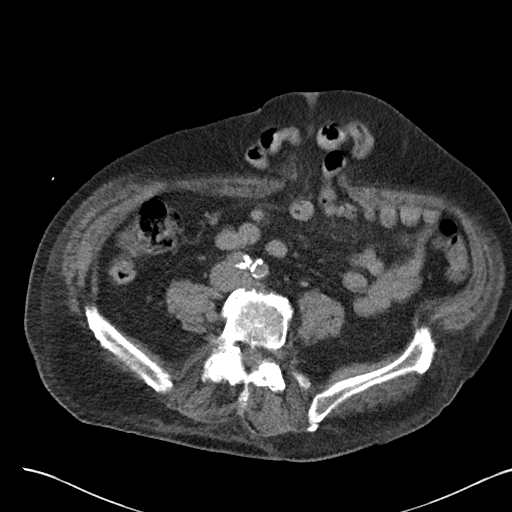
[im 53/95  soft-tissue]
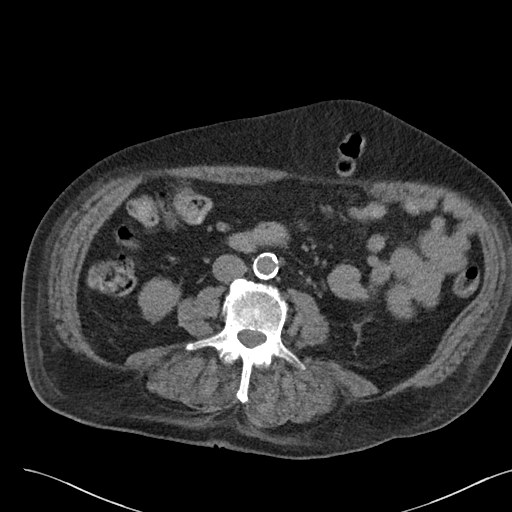
[im 61/95  soft-tissue]
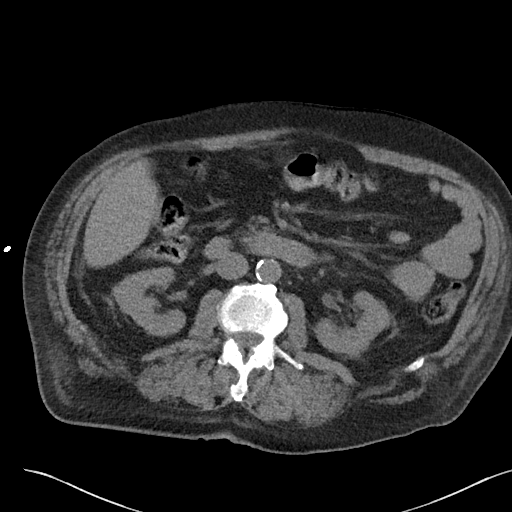
[im 68/95  soft-tissue]
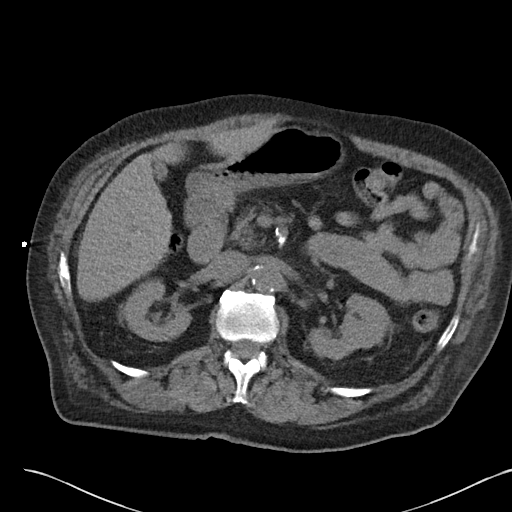
[im 68/95  bone]
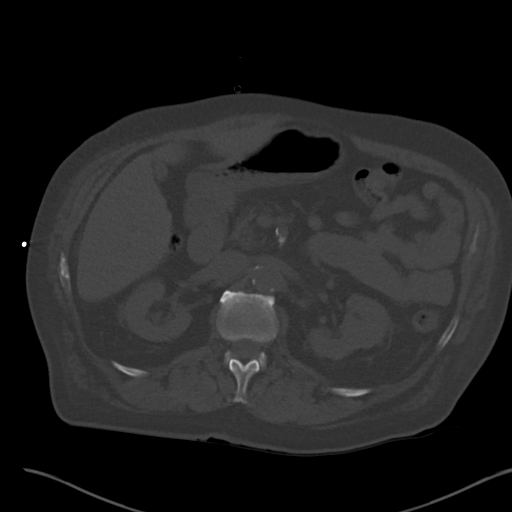
[im 76/95  soft-tissue]
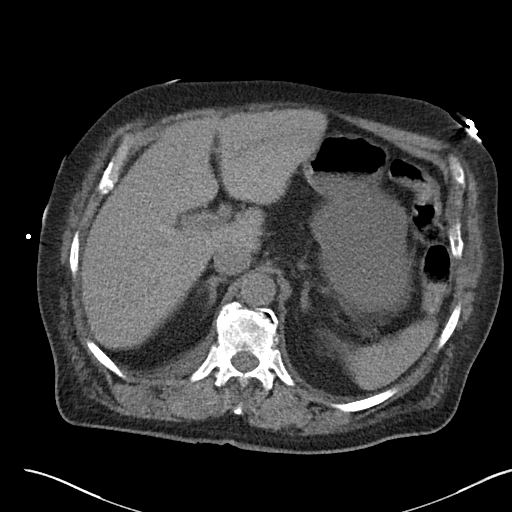
[im 83/95  soft-tissue]
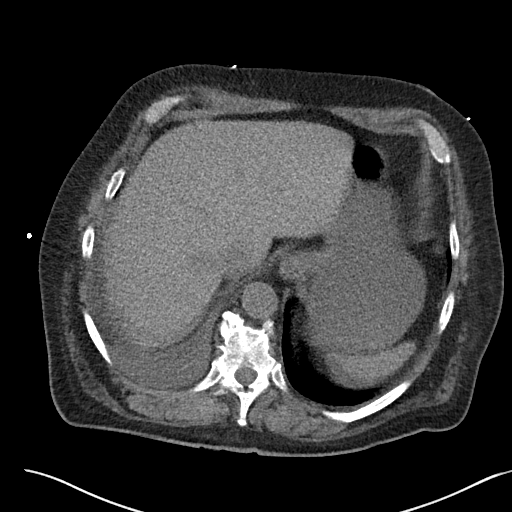
[im 91/95  soft-tissue]
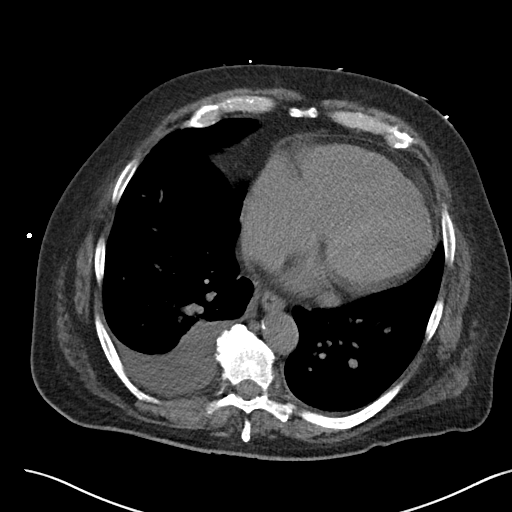

[Series 6: cor st · coronal · 0.87mm/px · 3 of 110 slices shown]
[im 37/110  soft-tissue]
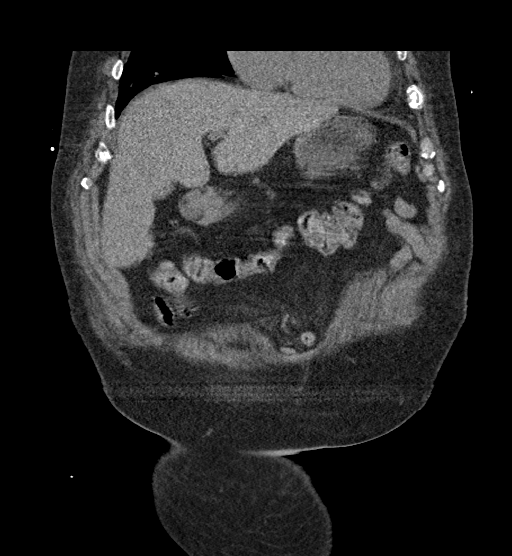
[im 49/110  soft-tissue]
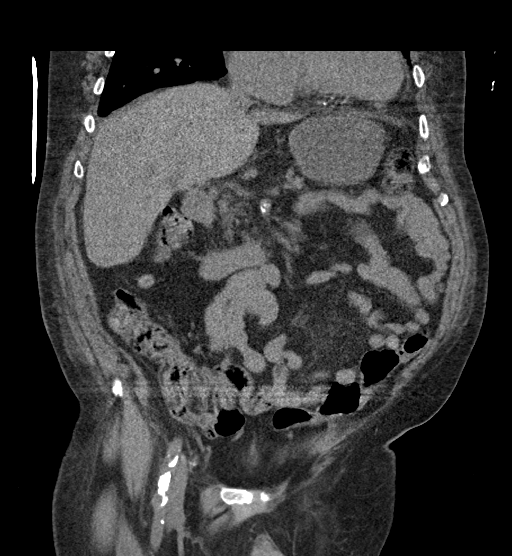
[im 61/110  soft-tissue]
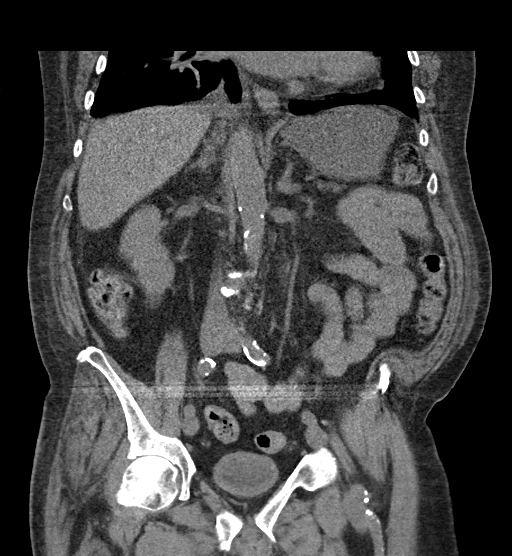

[15 of 46 positions shown; findings below may reference images not displayed]

FINDINGS: Lower chest: Small to moderate RIGHT effusion similar to recent CT
of the chest

Basilar septal thickening with similar appearance when compared to
recent imaging. Airspace disease in the RIGHT lower chest and LEFT
lung base with improvement. Resolution of LEFT-sided pleural fluid.
Heart size enlarged. Low-attenuation in cardiac chambers compatible
with given history of anemia.

Hepatobiliary: No focal, suspicious hepatic lesion on noncontrast
imaging. No pericholecystic stranding.

Pancreas: Pancreatic atrophy.  No signs of inflammation.

Spleen: Spleen normal size and contour.

Adrenals/Urinary Tract: Adrenal glands are normal.

Mild perinephric stranding bilaterally. No sign of hydronephrosis.
Urinary bladder is partially collapsed. No nephrolithiasis. No
ureteral calculi.

Stomach/Bowel: Fluid in the stomach no perigastric stranding. Small
bowel is normal caliber. Small-bowel now contained within a
moderately large ventral hernia with moderate rectus diastasis at
the site of the hernia. Diastasis at 4.9 cm. No sign of bowel
obstruction related to these findings. Fat with herniated through
this area on the prior study. Colonic diverticulosis. No sign of
diverticulitis. Appendix not visualized though no secondary signs to
suggest acute appendicitis.

Vascular/Lymphatic: Calcified atheromatous plaque of the abdominal
aorta. No aneurysmal dilation. Smooth contour of the IVC. There is
no gastrohepatic or hepatoduodenal ligament lymphadenopathy. No
retroperitoneal or mesenteric lymphadenopathy.

No pelvic sidewall lymphadenopathy. Streak artifact from sacroiliac
screws limit assessment.

Reproductive: Unremarkable on limited assessment.

Other: Bilateral fat containing inguinal hernias and moderately
large ventral hernia containing bowel and fat without obstruction.

Musculoskeletal: No expansion of abdominal wall musculature. No
expansion of the psoas musculature or other visualized muscular
structures. No signs to indicate retroperitoneal hematoma. ORIF of
previous sacroiliac diastasis. No destructive bone finding or acute
bone process.
IMPRESSION: 1. No signs of retroperitoneal hematoma.
2. Small to moderate RIGHT effusion similar to recent CT of the
chest. Septal thickening may be indicative of volume overload or
signs of heart failure. Areas of ground-glass and airspace disease
with some improvement may be indicative of improving superimposed
pneumonitis.
3. Airspace disease in the RIGHT lower chest and LEFT lung base with
improvement.
4. Moderately large ventral hernia containing bowel and fat without
obstruction.
5. Colonic diverticulosis without evidence of diverticulitis.
6. Aortic atherosclerosis.

## 2022-04-27 IMAGING — DX DG CHEST 1V
1 series · 1 of 1 positions shown · non-contrast
Comparison: One-view chest x-ray 09/07/2020 at [DATE] a.m.

CLINICAL DATA: ARDS.  Intubation.

EXAM:
CHEST  1 VIEW

[chest]
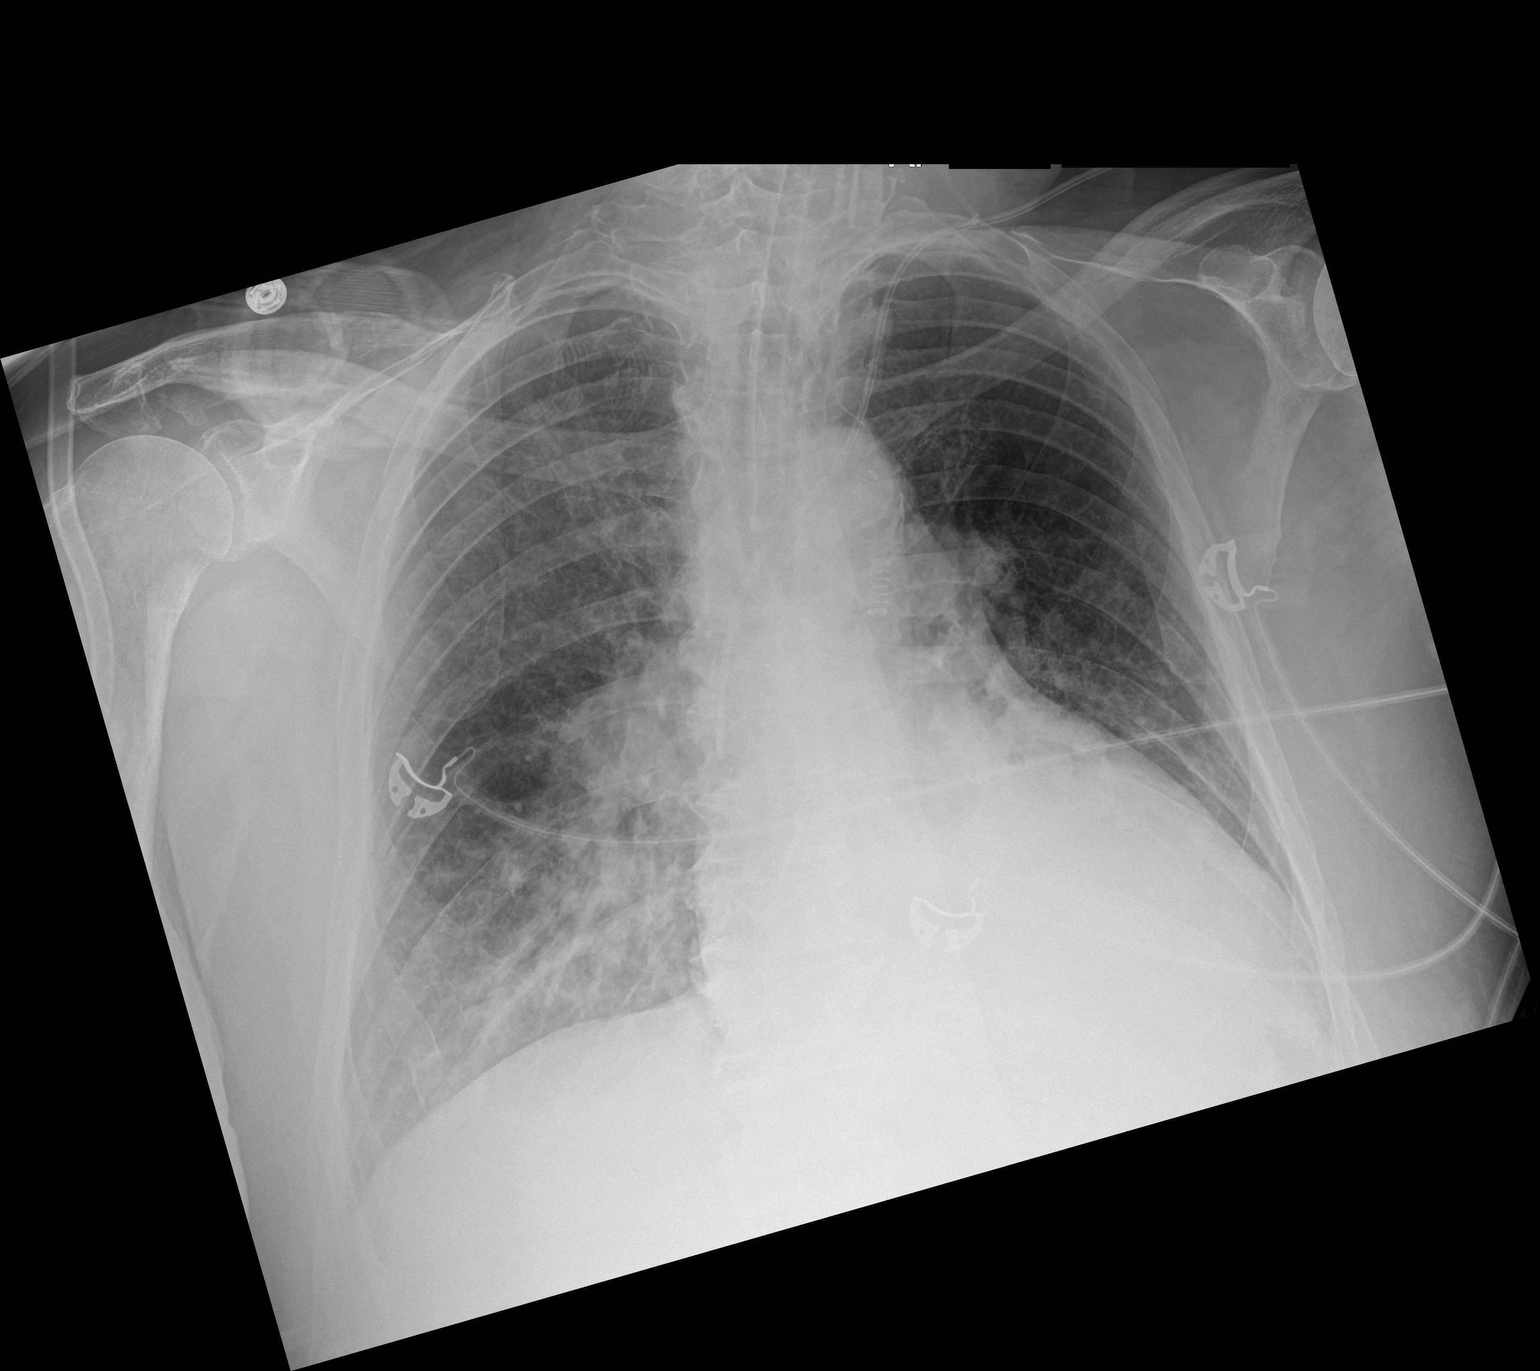

[1 of 1 positions shown; findings below may reference images not displayed]

FINDINGS: The heart is enlarged. Patient is now intubated. The endotracheal
tube terminates 2.5 cm above the carina. A left IJ line is in place.
The tip is above the cavoatrial junction. No pneumothorax is
present.

Atherosclerotic calcifications are present at the aortic arch.
Bibasilar airspace opacities have increased. Airspace opacity in the
right upper lung is improved.
IMPRESSION: 1. Interval intubation. The endotracheal tube terminates 2.5 cm
above the carina.
2. Satisfactory placement of left IJ line.
3. Improving right upper lobe airspace disease.
4. Increasing bibasilar airspace opacities. Findings of shifting
opacities consistent with ARDS and possible edema.

## 2022-04-27 IMAGING — DX DG CHEST 1V PORT
1 series · 1 of 1 positions shown · non-contrast
Comparison: 09/07/2020

CLINICAL DATA: Central line placement

EXAM:
PORTABLE CHEST 1 VIEW

[chest]
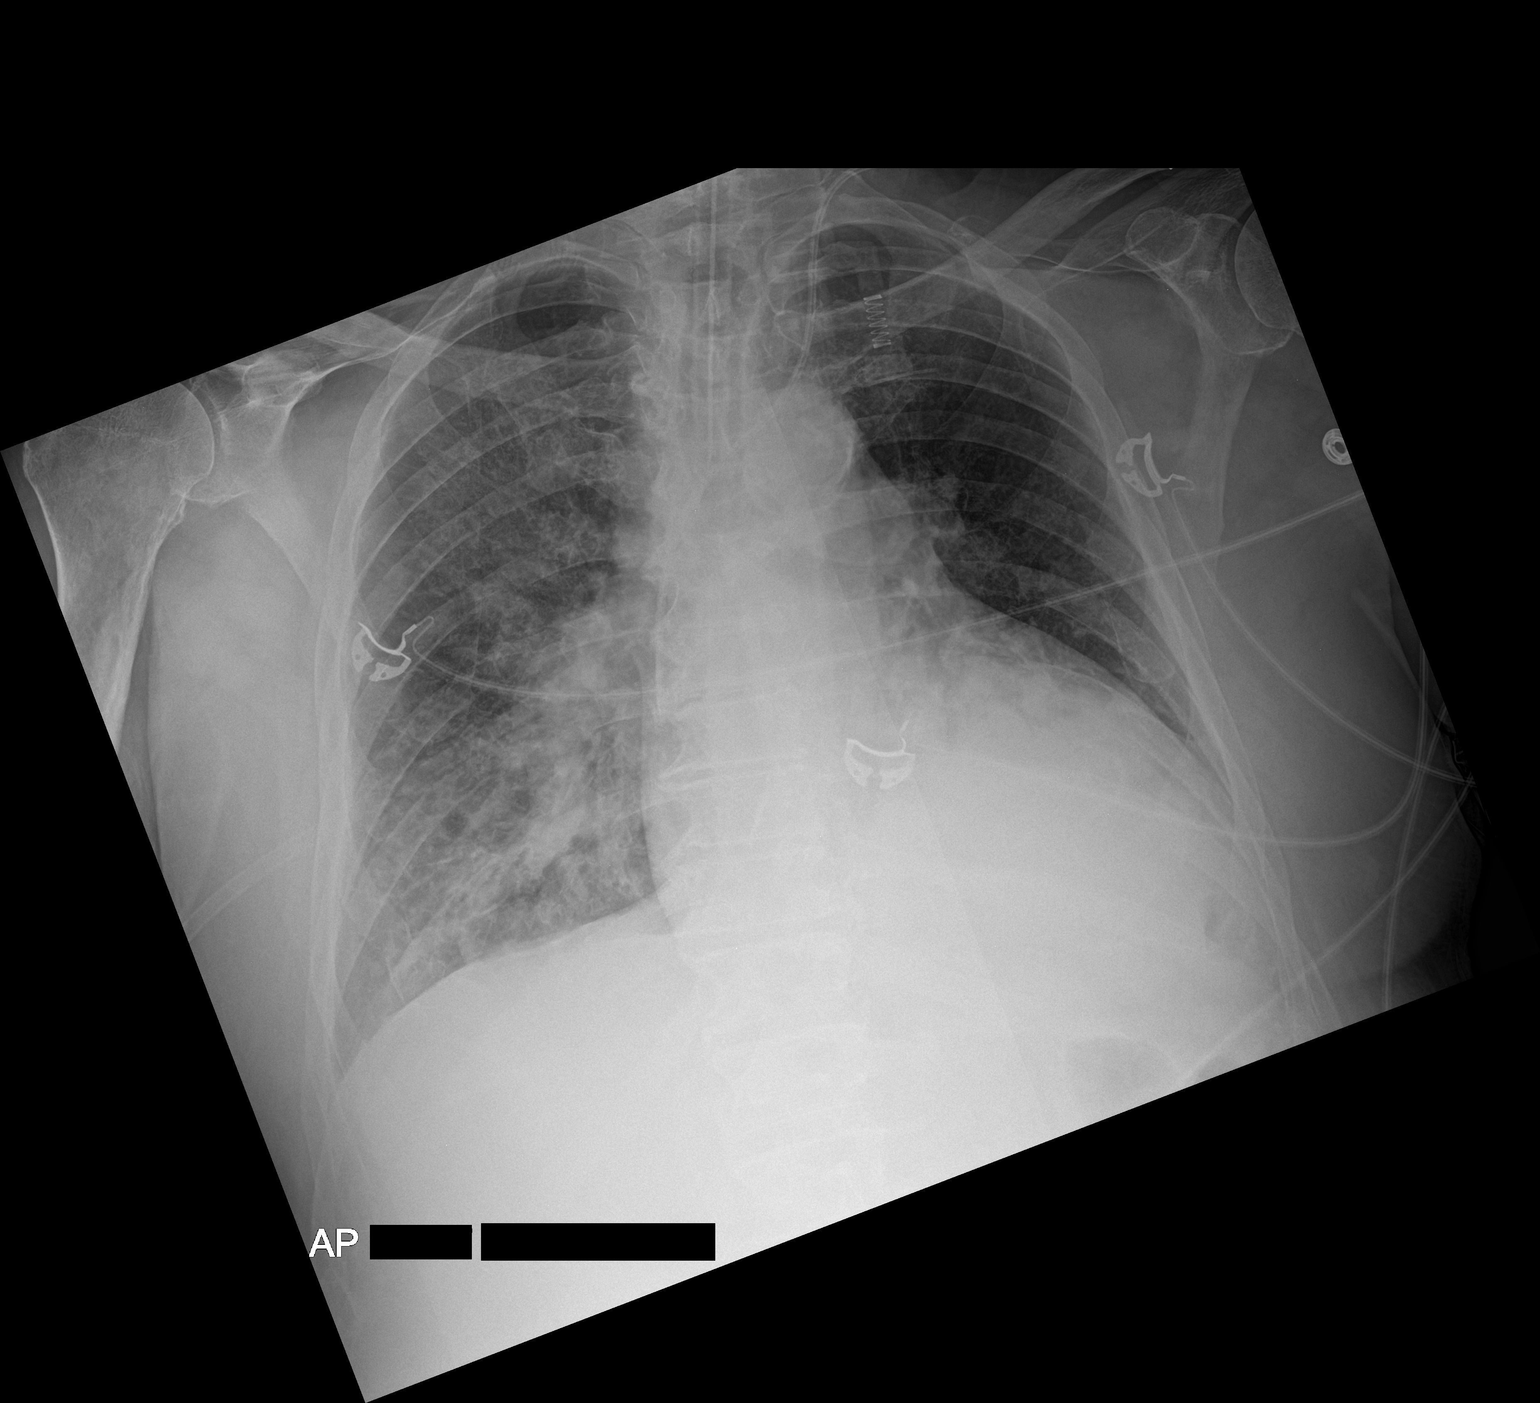

[1 of 1 positions shown; findings below may reference images not displayed]

FINDINGS: Endotracheal tube is been placed and is in good position

Left jugular central venous catheter tip in the lower SVC. No
pneumothorax

Extensive bilateral airspace disease right greater than left
unchanged.
IMPRESSION: Endotracheal tube and central line in good position. No pneumothorax

Bilateral airspace disease right greater than left unchanged from
earlier today.

## 2024-02-11 ENCOUNTER — Telehealth: Payer: Self-pay | Admitting: *Deleted

## 2024-02-11 NOTE — Telephone Encounter (Signed)
 Error
